# Patient Record
Sex: Female | Born: 1960
Health system: Southern US, Community
[De-identification: ages and names within clinical notes are randomized; demographics above are authoritative.]

## PROBLEM LIST (undated history)

## (undated) DIAGNOSIS — J3501 Chronic tonsillitis: Secondary | ICD-10-CM

## (undated) DIAGNOSIS — B351 Tinea unguium: Secondary | ICD-10-CM

## (undated) DIAGNOSIS — M545 Low back pain, unspecified: Secondary | ICD-10-CM

## (undated) DIAGNOSIS — H60399 Other infective otitis externa, unspecified ear: Secondary | ICD-10-CM

## (undated) DIAGNOSIS — T23272A Burn of second degree of left wrist, initial encounter: Secondary | ICD-10-CM

## (undated) DIAGNOSIS — G950 Syringomyelia and syringobulbia: Secondary | ICD-10-CM

## (undated) DIAGNOSIS — Z9889 Other specified postprocedural states: Secondary | ICD-10-CM

## (undated) DIAGNOSIS — K219 Gastro-esophageal reflux disease without esophagitis: Secondary | ICD-10-CM

## (undated) DIAGNOSIS — N393 Stress incontinence (female) (male): Secondary | ICD-10-CM

## (undated) DIAGNOSIS — L259 Unspecified contact dermatitis, unspecified cause: Secondary | ICD-10-CM

## (undated) DIAGNOSIS — I219 Acute myocardial infarction, unspecified: Secondary | ICD-10-CM

## (undated) DIAGNOSIS — R0789 Other chest pain: Secondary | ICD-10-CM

## (undated) DIAGNOSIS — M1990 Unspecified osteoarthritis, unspecified site: Secondary | ICD-10-CM

## (undated) DIAGNOSIS — R030 Elevated blood-pressure reading, without diagnosis of hypertension: Secondary | ICD-10-CM

## (undated) DIAGNOSIS — M542 Cervicalgia: Secondary | ICD-10-CM

## (undated) DIAGNOSIS — G43919 Migraine, unspecified, intractable, without status migrainosus: Secondary | ICD-10-CM

## (undated) DIAGNOSIS — R51 Headache: Secondary | ICD-10-CM

## (undated) DIAGNOSIS — G43719 Chronic migraine without aura, intractable, without status migrainosus: Secondary | ICD-10-CM

## (undated) DIAGNOSIS — R509 Fever, unspecified: Secondary | ICD-10-CM

## (undated) DIAGNOSIS — R748 Abnormal levels of other serum enzymes: Secondary | ICD-10-CM

## (undated) DIAGNOSIS — R21 Rash and other nonspecific skin eruption: Secondary | ICD-10-CM

## (undated) DIAGNOSIS — R0600 Dyspnea, unspecified: Secondary | ICD-10-CM

## (undated) DIAGNOSIS — F429 Obsessive-compulsive disorder, unspecified: Secondary | ICD-10-CM

## (undated) DIAGNOSIS — N12 Tubulo-interstitial nephritis, not specified as acute or chronic: Secondary | ICD-10-CM

## (undated) DIAGNOSIS — G43909 Migraine, unspecified, not intractable, without status migrainosus: Secondary | ICD-10-CM

## (undated) DIAGNOSIS — Z0000 Encounter for general adult medical examination without abnormal findings: Secondary | ICD-10-CM

## (undated) DIAGNOSIS — M25569 Pain in unspecified knee: Secondary | ICD-10-CM

## (undated) DIAGNOSIS — F439 Reaction to severe stress, unspecified: Secondary | ICD-10-CM

## (undated) DIAGNOSIS — R55 Syncope and collapse: Secondary | ICD-10-CM

## (undated) DIAGNOSIS — I10 Essential (primary) hypertension: Secondary | ICD-10-CM

## (undated) DIAGNOSIS — R945 Abnormal results of liver function studies: Secondary | ICD-10-CM

## (undated) DIAGNOSIS — J45909 Unspecified asthma, uncomplicated: Secondary | ICD-10-CM

## (undated) DIAGNOSIS — R209 Unspecified disturbances of skin sensation: Secondary | ICD-10-CM

## (undated) DIAGNOSIS — T7840XA Allergy, unspecified, initial encounter: Secondary | ICD-10-CM

## (undated) DIAGNOSIS — R110 Nausea: Secondary | ICD-10-CM

## (undated) DIAGNOSIS — K5900 Constipation, unspecified: Secondary | ICD-10-CM

## (undated) DIAGNOSIS — R000 Tachycardia, unspecified: Secondary | ICD-10-CM

## (undated) DIAGNOSIS — R002 Palpitations: Secondary | ICD-10-CM

## (undated) DIAGNOSIS — N390 Urinary tract infection, site not specified: Secondary | ICD-10-CM

## (undated) DIAGNOSIS — R739 Hyperglycemia, unspecified: Secondary | ICD-10-CM

## (undated) DIAGNOSIS — R112 Nausea with vomiting, unspecified: Secondary | ICD-10-CM

## (undated) DIAGNOSIS — J019 Acute sinusitis, unspecified: Secondary | ICD-10-CM

## (undated) DIAGNOSIS — R635 Abnormal weight gain: Secondary | ICD-10-CM

## (undated) DIAGNOSIS — I951 Orthostatic hypotension: Secondary | ICD-10-CM

## (undated) DIAGNOSIS — G930 Cerebral cysts: Secondary | ICD-10-CM

## (undated) DIAGNOSIS — R1012 Left upper quadrant pain: Secondary | ICD-10-CM

## (undated) HISTORY — DX: Low back pain: M54.5

## (undated) HISTORY — DX: Cerebral cysts: G93.0

## (undated) HISTORY — DX: Gastro-esophageal reflux disease without esophagitis: K21.9

## (undated) HISTORY — DX: Essential (primary) hypertension: I10

## (undated) HISTORY — DX: Tachycardia, unspecified: R00.0

## (undated) HISTORY — DX: Allergy, unspecified, initial encounter: T78.40XA

## (undated) HISTORY — DX: Urinary tract infection, site not specified: N39.0

## (undated) HISTORY — DX: Constipation, unspecified: K59.00

## (undated) HISTORY — PX: TUBAL LIGATION: SHX77

## (undated) HISTORY — DX: Obsessive-compulsive disorder, unspecified: F42.9

## (undated) HISTORY — DX: Headache: R51

## (undated) HISTORY — DX: Other specified postprocedural states: Z98.890

## (undated) HISTORY — DX: Abnormal results of liver function studies: R94.5

## (undated) HISTORY — DX: Acute myocardial infarction, unspecified: I21.9

## (undated) HISTORY — DX: Syringomyelia and syringobulbia: G95.0

## (undated) HISTORY — DX: Unspecified asthma, uncomplicated: J45.909

## (undated) HISTORY — DX: Tinea unguium: B35.1

## (undated) HISTORY — DX: Abnormal weight gain: R63.5

## (undated) HISTORY — DX: Pain in unspecified knee: M25.569

## (undated) HISTORY — DX: Stress incontinence (female) (male): N39.3

## (undated) HISTORY — DX: Low back pain, unspecified: M54.50

## (undated) HISTORY — DX: Migraine, unspecified, not intractable, without status migrainosus: G43.909

## (undated) HISTORY — DX: Nausea: R11.0

## (undated) HISTORY — PX: JOINT REPLACEMENT: SHX530

## (undated) HISTORY — DX: Palpitations: R00.2

## (undated) HISTORY — DX: Unspecified contact dermatitis, unspecified cause: L25.9

## (undated) HISTORY — DX: Burn of second degree of left wrist, initial encounter: T23.272A

## (undated) HISTORY — DX: Unspecified disturbances of skin sensation: R20.9

## (undated) HISTORY — DX: Unspecified osteoarthritis, unspecified site: M19.90

## (undated) HISTORY — DX: Tubulo-interstitial nephritis, not specified as acute or chronic: N12

## (undated) HISTORY — DX: Rash and other nonspecific skin eruption: R21

## (undated) HISTORY — DX: Cervicalgia: M54.2

## (undated) HISTORY — PX: BRAIN SURGERY: SHX531

## (undated) HISTORY — DX: Left upper quadrant pain: R10.12

## (undated) HISTORY — DX: Reaction to severe stress, unspecified: F43.9

## (undated) HISTORY — DX: Other chest pain: R07.89

## (undated) HISTORY — DX: Fever, unspecified: R50.9

## (undated) HISTORY — DX: Elevated blood-pressure reading, without diagnosis of hypertension: R03.0

## (undated) HISTORY — DX: Migraine, unspecified, intractable, without status migrainosus: G43.919

## (undated) HISTORY — DX: Encounter for general adult medical examination without abnormal findings: Z00.00

## (undated) HISTORY — DX: Acute sinusitis, unspecified: J01.90

## (undated) HISTORY — DX: Chronic migraine without aura, intractable, without status migrainosus: G43.719

## (undated) HISTORY — DX: Syncope and collapse: R55

## (undated) HISTORY — DX: Chronic tonsillitis: J35.01

## (undated) HISTORY — DX: Dyspnea, unspecified: R06.00

## (undated) HISTORY — DX: Orthostatic hypotension: I95.1

## (undated) HISTORY — PX: ABDOMINAL HYSTERECTOMY: SHX81

## (undated) HISTORY — DX: Nausea with vomiting, unspecified: R11.2

## (undated) HISTORY — DX: Hyperglycemia, unspecified: R73.9

## (undated) HISTORY — DX: Other infective otitis externa, unspecified ear: H60.399

## (undated) HISTORY — DX: Abnormal levels of other serum enzymes: R74.8

---

## 1999-08-23 ENCOUNTER — Other Ambulatory Visit: Admission: RE | Admit: 1999-08-23 | Discharge: 1999-08-23 | Payer: Self-pay | Admitting: Obstetrics & Gynecology

## 2000-08-21 ENCOUNTER — Other Ambulatory Visit: Admission: RE | Admit: 2000-08-21 | Discharge: 2000-08-21 | Payer: Self-pay | Admitting: Obstetrics & Gynecology

## 2001-08-24 ENCOUNTER — Other Ambulatory Visit: Admission: RE | Admit: 2001-08-24 | Discharge: 2001-08-24 | Payer: Self-pay | Admitting: Obstetrics & Gynecology

## 2002-12-15 ENCOUNTER — Other Ambulatory Visit: Admission: RE | Admit: 2002-12-15 | Discharge: 2002-12-15 | Payer: Self-pay | Admitting: Obstetrics & Gynecology

## 2003-06-07 ENCOUNTER — Encounter: Payer: Self-pay | Admitting: Obstetrics & Gynecology

## 2003-06-07 ENCOUNTER — Encounter: Admission: RE | Admit: 2003-06-07 | Discharge: 2003-06-07 | Payer: Self-pay | Admitting: Obstetrics & Gynecology

## 2003-10-28 HISTORY — PX: COLONOSCOPY: SHX5424

## 2003-11-15 ENCOUNTER — Ambulatory Visit (HOSPITAL_COMMUNITY): Admission: RE | Admit: 2003-11-15 | Discharge: 2003-11-15 | Payer: Self-pay | Admitting: Internal Medicine

## 2004-06-20 ENCOUNTER — Encounter: Admission: RE | Admit: 2004-06-20 | Discharge: 2004-06-20 | Payer: Self-pay | Admitting: Obstetrics & Gynecology

## 2004-06-27 ENCOUNTER — Encounter: Admission: RE | Admit: 2004-06-27 | Discharge: 2004-06-27 | Payer: Self-pay | Admitting: Obstetrics & Gynecology

## 2004-10-01 ENCOUNTER — Ambulatory Visit: Payer: Self-pay | Admitting: Internal Medicine

## 2004-10-16 ENCOUNTER — Ambulatory Visit: Payer: Self-pay | Admitting: Internal Medicine

## 2004-10-27 HISTORY — PX: OTHER SURGICAL HISTORY: SHX169

## 2004-11-12 ENCOUNTER — Ambulatory Visit: Payer: Self-pay | Admitting: Internal Medicine

## 2005-02-04 ENCOUNTER — Ambulatory Visit: Payer: Self-pay | Admitting: Internal Medicine

## 2005-03-07 ENCOUNTER — Ambulatory Visit: Payer: Self-pay | Admitting: Internal Medicine

## 2005-05-22 ENCOUNTER — Ambulatory Visit: Payer: Self-pay | Admitting: Internal Medicine

## 2005-09-24 ENCOUNTER — Ambulatory Visit: Payer: Self-pay | Admitting: Internal Medicine

## 2005-10-29 ENCOUNTER — Ambulatory Visit: Payer: Self-pay | Admitting: Internal Medicine

## 2005-11-18 ENCOUNTER — Encounter: Admission: RE | Admit: 2005-11-18 | Discharge: 2005-11-18 | Payer: Self-pay | Admitting: Obstetrics & Gynecology

## 2005-11-19 ENCOUNTER — Ambulatory Visit: Payer: Self-pay | Admitting: Internal Medicine

## 2006-01-14 ENCOUNTER — Ambulatory Visit: Payer: Self-pay | Admitting: Internal Medicine

## 2006-02-10 ENCOUNTER — Ambulatory Visit: Payer: Self-pay | Admitting: Internal Medicine

## 2006-03-24 ENCOUNTER — Ambulatory Visit: Payer: Self-pay | Admitting: Internal Medicine

## 2006-06-02 ENCOUNTER — Ambulatory Visit: Payer: Self-pay | Admitting: Internal Medicine

## 2006-07-23 ENCOUNTER — Ambulatory Visit: Payer: Self-pay | Admitting: Internal Medicine

## 2006-07-31 ENCOUNTER — Emergency Department (HOSPITAL_COMMUNITY): Admission: EM | Admit: 2006-07-31 | Discharge: 2006-07-31 | Payer: Self-pay | Admitting: Emergency Medicine

## 2006-08-19 ENCOUNTER — Emergency Department (HOSPITAL_COMMUNITY): Admission: EM | Admit: 2006-08-19 | Discharge: 2006-08-19 | Payer: Self-pay | Admitting: Emergency Medicine

## 2006-09-22 ENCOUNTER — Ambulatory Visit: Payer: Self-pay | Admitting: Internal Medicine

## 2006-10-27 DIAGNOSIS — N12 Tubulo-interstitial nephritis, not specified as acute or chronic: Secondary | ICD-10-CM

## 2006-10-27 HISTORY — DX: Tubulo-interstitial nephritis, not specified as acute or chronic: N12

## 2006-11-17 ENCOUNTER — Ambulatory Visit: Payer: Self-pay | Admitting: Internal Medicine

## 2006-11-24 ENCOUNTER — Encounter: Admission: RE | Admit: 2006-11-24 | Discharge: 2006-11-24 | Payer: Self-pay | Admitting: Obstetrics & Gynecology

## 2007-02-16 ENCOUNTER — Ambulatory Visit: Payer: Self-pay | Admitting: Internal Medicine

## 2007-05-19 ENCOUNTER — Ambulatory Visit: Payer: Self-pay | Admitting: Internal Medicine

## 2007-05-19 LAB — CONVERTED CEMR LAB
ALT: 50 units/L — ABNORMAL HIGH (ref 0–35)
AST: 40 units/L — ABNORMAL HIGH (ref 0–37)
Albumin: 3.9 g/dL (ref 3.5–5.2)
Basophils Absolute: 0.1 10*3/uL (ref 0.0–0.1)
Calcium: 9.3 mg/dL (ref 8.4–10.5)
Chloride: 106 meq/L (ref 96–112)
Creatinine, Ser: 0.7 mg/dL (ref 0.4–1.2)
Eosinophils Absolute: 0.1 10*3/uL (ref 0.0–0.6)
Eosinophils Relative: 1.7 % (ref 0.0–5.0)
GFR calc non Af Amer: 95 mL/min
HCT: 34.6 % — ABNORMAL LOW (ref 36.0–46.0)
MCHC: 35.5 g/dL (ref 30.0–36.0)
MCV: 93.5 fL (ref 78.0–100.0)
Neutrophils Relative %: 59.7 % (ref 43.0–77.0)
Platelets: 214 10*3/uL (ref 150–400)
RBC: 3.7 M/uL — ABNORMAL LOW (ref 3.87–5.11)
RDW: 12.5 % (ref 11.5–14.6)
Sodium: 141 meq/L (ref 135–145)
Total Bilirubin: 1 mg/dL (ref 0.3–1.2)
WBC: 5.8 10*3/uL (ref 4.5–10.5)

## 2007-08-18 ENCOUNTER — Telehealth: Payer: Self-pay | Admitting: Internal Medicine

## 2007-08-20 ENCOUNTER — Encounter: Payer: Self-pay | Admitting: Internal Medicine

## 2007-08-20 DIAGNOSIS — G950 Syringomyelia and syringobulbia: Secondary | ICD-10-CM | POA: Insufficient documentation

## 2007-08-20 HISTORY — DX: Syringomyelia and syringobulbia: G95.0

## 2007-09-17 ENCOUNTER — Telehealth: Payer: Self-pay | Admitting: Internal Medicine

## 2007-09-29 ENCOUNTER — Ambulatory Visit: Payer: Self-pay | Admitting: Internal Medicine

## 2007-09-29 DIAGNOSIS — K219 Gastro-esophageal reflux disease without esophagitis: Secondary | ICD-10-CM

## 2007-09-29 DIAGNOSIS — R509 Fever, unspecified: Secondary | ICD-10-CM

## 2007-09-29 DIAGNOSIS — M545 Low back pain, unspecified: Secondary | ICD-10-CM

## 2007-09-29 HISTORY — DX: Fever, unspecified: R50.9

## 2007-09-29 HISTORY — DX: Gastro-esophageal reflux disease without esophagitis: K21.9

## 2007-09-29 HISTORY — DX: Low back pain, unspecified: M54.50

## 2007-09-29 LAB — CONVERTED CEMR LAB
Basophils Absolute: 0 10*3/uL (ref 0.0–0.1)
Bilirubin Urine: NEGATIVE
Chloride: 98 meq/L (ref 96–112)
Creatinine, Ser: 0.8 mg/dL (ref 0.4–1.2)
Eosinophils Absolute: 0 10*3/uL (ref 0.0–0.6)
GFR calc non Af Amer: 82 mL/min
Glucose, Bld: 124 mg/dL — ABNORMAL HIGH (ref 70–99)
HCT: 38.1 % (ref 36.0–46.0)
Hemoglobin: 12.9 g/dL (ref 12.0–15.0)
Lymphocytes Relative: 11.8 % — ABNORMAL LOW (ref 12.0–46.0)
MCHC: 33.9 g/dL (ref 30.0–36.0)
MCV: 93.1 fL (ref 78.0–100.0)
Monocytes Absolute: 0.5 10*3/uL (ref 0.2–0.7)
Monocytes Relative: 4.8 % (ref 3.0–11.0)
Neutro Abs: 8.1 10*3/uL — ABNORMAL HIGH (ref 1.4–7.7)
Neutrophils Relative %: 82.9 % — ABNORMAL HIGH (ref 43.0–77.0)
Nitrite: POSITIVE — AB
Potassium: 3.8 meq/L (ref 3.5–5.1)
Sodium: 136 meq/L (ref 135–145)
Total Protein, Urine: NEGATIVE mg/dL
Urine Glucose: NEGATIVE mg/dL
Urobilinogen, UA: 0.2 (ref 0.0–1.0)
pH: 6 (ref 5.0–8.0)

## 2007-09-30 ENCOUNTER — Telehealth: Payer: Self-pay | Admitting: Internal Medicine

## 2007-10-28 DIAGNOSIS — F439 Reaction to severe stress, unspecified: Secondary | ICD-10-CM

## 2007-10-28 HISTORY — DX: Reaction to severe stress, unspecified: F43.9

## 2007-11-17 ENCOUNTER — Encounter: Payer: Self-pay | Admitting: Internal Medicine

## 2007-12-03 ENCOUNTER — Encounter: Payer: Self-pay | Admitting: Internal Medicine

## 2007-12-20 ENCOUNTER — Ambulatory Visit: Payer: Self-pay | Admitting: Internal Medicine

## 2007-12-20 DIAGNOSIS — R51 Headache: Secondary | ICD-10-CM

## 2007-12-20 DIAGNOSIS — R519 Headache, unspecified: Secondary | ICD-10-CM

## 2007-12-20 HISTORY — DX: Headache, unspecified: R51.9

## 2007-12-22 LAB — CONVERTED CEMR LAB
Bilirubin Urine: NEGATIVE
Hemoglobin, Urine: NEGATIVE

## 2007-12-28 ENCOUNTER — Telehealth: Payer: Self-pay | Admitting: Internal Medicine

## 2007-12-30 ENCOUNTER — Telehealth: Payer: Self-pay | Admitting: Internal Medicine

## 2008-01-20 ENCOUNTER — Encounter: Admission: RE | Admit: 2008-01-20 | Discharge: 2008-01-20 | Payer: Self-pay | Admitting: Obstetrics & Gynecology

## 2008-01-21 ENCOUNTER — Encounter: Payer: Self-pay | Admitting: Internal Medicine

## 2008-01-24 ENCOUNTER — Telehealth: Payer: Self-pay | Admitting: Internal Medicine

## 2008-02-07 ENCOUNTER — Telehealth: Payer: Self-pay | Admitting: Internal Medicine

## 2008-02-22 ENCOUNTER — Telehealth: Payer: Self-pay | Admitting: Internal Medicine

## 2008-03-08 ENCOUNTER — Telehealth: Payer: Self-pay | Admitting: Internal Medicine

## 2008-03-23 ENCOUNTER — Ambulatory Visit: Payer: Self-pay | Admitting: Internal Medicine

## 2008-03-23 DIAGNOSIS — N390 Urinary tract infection, site not specified: Secondary | ICD-10-CM

## 2008-03-23 HISTORY — DX: Urinary tract infection, site not specified: N39.0

## 2008-03-27 ENCOUNTER — Telehealth: Payer: Self-pay | Admitting: Internal Medicine

## 2008-04-11 ENCOUNTER — Ambulatory Visit: Payer: Self-pay | Admitting: Internal Medicine

## 2008-04-11 LAB — CONVERTED CEMR LAB
BUN: 13 mg/dL (ref 6–23)
CO2: 27 meq/L (ref 19–32)
Chloride: 108 meq/L (ref 96–112)
Creatinine, Ser: 0.9 mg/dL (ref 0.4–1.2)
Glucose, Bld: 71 mg/dL (ref 70–99)

## 2008-04-18 ENCOUNTER — Encounter: Payer: Self-pay | Admitting: Internal Medicine

## 2008-04-20 ENCOUNTER — Encounter: Payer: Self-pay | Admitting: Internal Medicine

## 2008-05-02 ENCOUNTER — Encounter: Payer: Self-pay | Admitting: Internal Medicine

## 2008-05-10 ENCOUNTER — Ambulatory Visit: Payer: Self-pay | Admitting: Internal Medicine

## 2008-05-10 DIAGNOSIS — H60399 Other infective otitis externa, unspecified ear: Secondary | ICD-10-CM | POA: Insufficient documentation

## 2008-05-10 HISTORY — DX: Other infective otitis externa, unspecified ear: H60.399

## 2008-07-13 ENCOUNTER — Ambulatory Visit: Payer: Self-pay | Admitting: Internal Medicine

## 2008-07-13 DIAGNOSIS — R209 Unspecified disturbances of skin sensation: Secondary | ICD-10-CM | POA: Insufficient documentation

## 2008-07-13 HISTORY — DX: Unspecified disturbances of skin sensation: R20.9

## 2008-07-25 ENCOUNTER — Telehealth: Payer: Self-pay | Admitting: Internal Medicine

## 2008-09-07 ENCOUNTER — Encounter
Admission: RE | Admit: 2008-09-07 | Discharge: 2008-12-06 | Payer: Self-pay | Admitting: Physical Medicine and Rehabilitation

## 2008-09-08 ENCOUNTER — Ambulatory Visit: Payer: Self-pay | Admitting: Physical Medicine and Rehabilitation

## 2008-09-12 ENCOUNTER — Ambulatory Visit: Payer: Self-pay | Admitting: Internal Medicine

## 2008-09-25 ENCOUNTER — Telehealth: Payer: Self-pay | Admitting: Internal Medicine

## 2008-09-27 ENCOUNTER — Encounter: Payer: Self-pay | Admitting: Internal Medicine

## 2008-10-06 ENCOUNTER — Ambulatory Visit: Payer: Self-pay | Admitting: Physical Medicine and Rehabilitation

## 2008-11-17 ENCOUNTER — Ambulatory Visit: Payer: Self-pay | Admitting: Physical Medicine and Rehabilitation

## 2008-12-05 ENCOUNTER — Ambulatory Visit: Payer: Self-pay | Admitting: Internal Medicine

## 2008-12-05 DIAGNOSIS — J019 Acute sinusitis, unspecified: Secondary | ICD-10-CM | POA: Insufficient documentation

## 2008-12-05 HISTORY — DX: Acute sinusitis, unspecified: J01.90

## 2009-01-03 ENCOUNTER — Telehealth: Payer: Self-pay | Admitting: Internal Medicine

## 2009-02-06 ENCOUNTER — Ambulatory Visit: Payer: Self-pay | Admitting: Internal Medicine

## 2009-02-06 DIAGNOSIS — N393 Stress incontinence (female) (male): Secondary | ICD-10-CM

## 2009-02-06 HISTORY — DX: Stress incontinence (female) (male): N39.3

## 2009-02-07 ENCOUNTER — Encounter: Payer: Self-pay | Admitting: Internal Medicine

## 2009-02-08 LAB — CONVERTED CEMR LAB
Bilirubin Urine: NEGATIVE
Hemoglobin, Urine: NEGATIVE
Ketones, ur: NEGATIVE mg/dL
Leukocytes, UA: NEGATIVE
Nitrite: NEGATIVE
Specific Gravity, Urine: <=1.005 (ref 1.000–1.030)
Total Protein, Urine: NEGATIVE mg/dL
Urine Glucose: NEGATIVE mg/dL
Urobilinogen, UA: 0.2 (ref 0.0–1.0)
pH: 6 (ref 5.0–8.0)

## 2009-03-02 ENCOUNTER — Telehealth: Payer: Self-pay | Admitting: Internal Medicine

## 2009-03-16 ENCOUNTER — Ambulatory Visit: Payer: Self-pay | Admitting: Internal Medicine

## 2009-03-19 LAB — CONVERTED CEMR LAB
Ketones, ur: NEGATIVE mg/dL
Specific Gravity, Urine: 1.01 (ref 1.000–1.030)
Urine Glucose: NEGATIVE mg/dL
pH: 6.5 (ref 5.0–8.0)

## 2009-03-20 ENCOUNTER — Telehealth: Payer: Self-pay | Admitting: Internal Medicine

## 2009-03-30 ENCOUNTER — Ambulatory Visit: Payer: Self-pay | Admitting: Internal Medicine

## 2009-05-02 ENCOUNTER — Telehealth: Payer: Self-pay | Admitting: Internal Medicine

## 2009-05-18 ENCOUNTER — Ambulatory Visit: Payer: Self-pay | Admitting: Internal Medicine

## 2009-05-18 ENCOUNTER — Encounter: Admission: RE | Admit: 2009-05-18 | Discharge: 2009-05-18 | Payer: Self-pay | Admitting: Obstetrics & Gynecology

## 2009-05-29 ENCOUNTER — Telehealth: Payer: Self-pay | Admitting: Internal Medicine

## 2009-06-01 ENCOUNTER — Telehealth: Payer: Self-pay | Admitting: Internal Medicine

## 2009-06-14 ENCOUNTER — Ambulatory Visit: Payer: Self-pay | Admitting: Internal Medicine

## 2009-06-14 DIAGNOSIS — M542 Cervicalgia: Secondary | ICD-10-CM | POA: Insufficient documentation

## 2009-06-14 DIAGNOSIS — G43919 Migraine, unspecified, intractable, without status migrainosus: Secondary | ICD-10-CM

## 2009-06-14 DIAGNOSIS — G43909 Migraine, unspecified, not intractable, without status migrainosus: Secondary | ICD-10-CM | POA: Insufficient documentation

## 2009-06-14 DIAGNOSIS — R110 Nausea: Secondary | ICD-10-CM

## 2009-06-14 HISTORY — DX: Migraine, unspecified, intractable, without status migrainosus: G43.919

## 2009-06-14 HISTORY — DX: Nausea: R11.0

## 2009-06-14 HISTORY — DX: Cervicalgia: M54.2

## 2009-07-10 ENCOUNTER — Telehealth: Payer: Self-pay | Admitting: Internal Medicine

## 2009-07-27 ENCOUNTER — Ambulatory Visit: Payer: Self-pay | Admitting: Internal Medicine

## 2009-07-27 ENCOUNTER — Encounter: Payer: Self-pay | Admitting: Internal Medicine

## 2009-07-30 LAB — CONVERTED CEMR LAB
Alkaline Phosphatase: 204 units/L — ABNORMAL HIGH (ref 39–117)
Basophils Relative: 0.8 % (ref 0.0–3.0)
Bilirubin, Direct: 0.1 mg/dL (ref 0.0–0.3)
CO2: 32 meq/L (ref 19–32)
Calcium: 10 mg/dL (ref 8.4–10.5)
Eosinophils Absolute: 0.1 10*3/uL (ref 0.0–0.7)
GFR calc non Af Amer: 70.61 mL/min (ref >60)
HCT: 39.7 % (ref 36.0–46.0)
Hemoglobin: 13.6 g/dL (ref 12.0–15.0)
Lymphs Abs: 1.4 10*3/uL (ref 0.7–4.0)
MCHC: 34.2 g/dL (ref 30.0–36.0)
MCV: 93.6 fL (ref 78.0–100.0)
Monocytes Absolute: 0.1 10*3/uL (ref 0.1–1.0)
Neutro Abs: 2.8 10*3/uL (ref 1.4–7.7)
RBC: 4.24 M/uL (ref 3.87–5.11)
Sodium: 143 meq/L (ref 135–145)
Total Bilirubin: 0.7 mg/dL (ref 0.3–1.2)
Total Protein: 7.2 g/dL (ref 6.0–8.3)

## 2009-08-01 ENCOUNTER — Telehealth: Payer: Self-pay | Admitting: Internal Medicine

## 2009-08-03 ENCOUNTER — Telehealth: Payer: Self-pay | Admitting: Internal Medicine

## 2009-08-03 ENCOUNTER — Encounter: Admission: RE | Admit: 2009-08-03 | Discharge: 2009-08-03 | Payer: Self-pay | Admitting: Internal Medicine

## 2009-08-27 ENCOUNTER — Encounter: Payer: Self-pay | Admitting: Internal Medicine

## 2009-09-03 ENCOUNTER — Telehealth: Payer: Self-pay | Admitting: Internal Medicine

## 2009-09-17 ENCOUNTER — Telehealth: Payer: Self-pay | Admitting: Internal Medicine

## 2009-09-28 ENCOUNTER — Ambulatory Visit: Payer: Self-pay | Admitting: Internal Medicine

## 2009-09-28 DIAGNOSIS — R945 Abnormal results of liver function studies: Secondary | ICD-10-CM

## 2009-09-28 DIAGNOSIS — R635 Abnormal weight gain: Secondary | ICD-10-CM | POA: Insufficient documentation

## 2009-09-28 HISTORY — DX: Abnormal results of liver function studies: R94.5

## 2009-10-03 LAB — CONVERTED CEMR LAB
ALT: 43 units/L — ABNORMAL HIGH (ref 0–35)
AST: 36 units/L (ref 0–37)
Albumin: 3.9 g/dL (ref 3.5–5.2)
Bilirubin Urine: NEGATIVE
Hemoglobin, Urine: NEGATIVE
Ketones, ur: NEGATIVE mg/dL
Leukocytes, UA: NEGATIVE
Nitrite: NEGATIVE

## 2009-12-07 ENCOUNTER — Ambulatory Visit: Payer: Self-pay | Admitting: Internal Medicine

## 2010-01-07 ENCOUNTER — Telehealth: Payer: Self-pay | Admitting: Internal Medicine

## 2010-02-13 ENCOUNTER — Telehealth: Payer: Self-pay | Admitting: Internal Medicine

## 2010-03-06 ENCOUNTER — Ambulatory Visit: Payer: Self-pay | Admitting: Internal Medicine

## 2010-03-06 LAB — CONVERTED CEMR LAB
ALT: 32 units/L (ref 0–35)
Albumin: 4.2 g/dL (ref 3.5–5.2)
BUN: 13 mg/dL (ref 6–23)
Basophils Absolute: 0 10*3/uL (ref 0.0–0.1)
Basophils Relative: 0.7 % (ref 0.0–3.0)
Chloride: 107 meq/L (ref 96–112)
Cholesterol: 201 mg/dL — ABNORMAL HIGH (ref 0–200)
Creatinine, Ser: 0.8 mg/dL (ref 0.4–1.2)
Eosinophils Absolute: 0.1 10*3/uL (ref 0.0–0.7)
GFR calc non Af Amer: 85.62 mL/min (ref >60)
Glucose, Bld: 91 mg/dL (ref 70–99)
Hemoglobin: 12.8 g/dL (ref 12.0–15.0)
Lymphocytes Relative: 38.7 % (ref 12.0–46.0)
Lymphs Abs: 1.8 10*3/uL (ref 0.7–4.0)
MCHC: 34.5 g/dL (ref 30.0–36.0)
MCV: 90.9 fL (ref 78.0–100.0)
Monocytes Absolute: 0.2 10*3/uL (ref 0.1–1.0)
Neutro Abs: 2.5 10*3/uL (ref 1.4–7.7)
RBC: 4.08 M/uL (ref 3.87–5.11)
RDW: 13.4 % (ref 11.5–14.6)
Total Bilirubin: 0.8 mg/dL (ref 0.3–1.2)
Total CHOL/HDL Ratio: 4
Triglycerides: 129 mg/dL (ref 0.0–149.0)

## 2010-03-07 ENCOUNTER — Ambulatory Visit: Payer: Self-pay | Admitting: Internal Medicine

## 2010-03-15 ENCOUNTER — Telehealth: Payer: Self-pay | Admitting: Internal Medicine

## 2010-05-20 ENCOUNTER — Telehealth: Payer: Self-pay | Admitting: Internal Medicine

## 2010-05-23 ENCOUNTER — Ambulatory Visit: Payer: Self-pay | Admitting: Internal Medicine

## 2010-05-23 DIAGNOSIS — F429 Obsessive-compulsive disorder, unspecified: Secondary | ICD-10-CM

## 2010-05-23 DIAGNOSIS — R1012 Left upper quadrant pain: Secondary | ICD-10-CM

## 2010-05-23 HISTORY — DX: Left upper quadrant pain: R10.12

## 2010-05-23 HISTORY — DX: Obsessive-compulsive disorder, unspecified: F42.9

## 2010-08-23 ENCOUNTER — Ambulatory Visit: Payer: Self-pay | Admitting: Internal Medicine

## 2010-09-09 ENCOUNTER — Telehealth: Payer: Self-pay | Admitting: Internal Medicine

## 2010-09-23 ENCOUNTER — Ambulatory Visit: Payer: Self-pay | Admitting: Internal Medicine

## 2010-11-17 ENCOUNTER — Encounter: Payer: Self-pay | Admitting: Obstetrics & Gynecology

## 2010-11-22 ENCOUNTER — Ambulatory Visit
Admission: RE | Admit: 2010-11-22 | Discharge: 2010-11-22 | Payer: Self-pay | Source: Home / Self Care | Attending: Internal Medicine | Admitting: Internal Medicine

## 2010-11-28 NOTE — Progress Notes (Signed)
Summary: Rx request   Phone Note Call from Patient   Caller: Patient Summary of Call: pt left vm stating she has a sore throat and head congestion. She is requesting Zpak be sent to [facility]. Please advise Initial call taken by: Lanier Prude, [facility]),  September 09, 2010 5:07 PM  Follow-up for Phone Call        ok Follow-up by: Tresa Garter MD,  September 09, 2010 9:20 PM  Additional Follow-up for Phone Call Additional follow up Details #1::        Rx sent in//lmovm to notify pt.Alvy Beal Archie CMA  September 10, 2010 8:13 AM     New/Updated Medications: ZITHROMAX Z-PAK 250 MG TABS (AZITHROMYCIN) as dirrected Prescriptions: ZITHROMAX Z-PAK 250 MG TABS (AZITHROMYCIN) as dirrected  #1 x 0   Entered by:   Rock Nephew CMA   Authorized by:   Tresa Garter MD   Signed by:   Rock Nephew CMA on 09/10/2010   Method used:   Electronically to        [facility]* (retail)       [address]       [place], [place]  [id number]       Ph: [phone]       Fax: [phone]   RxID:   [id number]

## 2010-11-28 NOTE — Assessment & Plan Note (Signed)
Summary: 3 mo rov /nws  #   Vital Signs:  Patient profile:   50 year old female Height:      65 inches Weight:      165 pounds BMI:     27.56 Temp:     97.6 degrees F oral Pulse rate:   84 / minute Pulse rhythm:   regular Resp:     16 per minute BP sitting:   130 / 90  (left arm) Cuff size:   regular  Vitals Entered By: Lanier Prude, CMA(AAMA) (August 23, 2010 8:22 AM) CC: 3 mo f/u Comments pt is not taking Fluvoxamine.  She states she needs new rx for maxalt.  She has been taking 2 once daily    CC:  3 mo f/u.  History of Present Illness: The patient presents for a follow up of back pain, anxiety, OCD  and headaches.  OCD is better. She had a few more HAs  lately  Current Medications (verified): 1)  Oxycodone Hcl 15 Mg  Tabs (Oxycodone Hcl) .... 1/2 or 1 By Mouth Qid As Needed Pain Please,  Fill On or After  07/22/10          . Ok To Fill 1-2 Day Earlier When The Month Has 31 Days in It. 2)  Ibuprofen 600 Mg  Tabs (Ibuprofen) .... Take 1 Tablet By Mouth Three Times A Day As Needed Pain 3)  Protonix 40 Mg  Tbec (Pantoprazole Sodium) .Marland Kitchen.. 1 Once Daily 4)  Vitamin D3 1000 Unit  Tabs (Cholecalciferol) .Marland Kitchen.. 1 By Mouth Daily 5)  Topicort 0.25 % Crea (Desoximetasone) .... Use Two Times A Day Prn 6)  Sumatriptan Succinate 6 Mg/0.66ml Kit (Sumatriptan Succinate) .Marland Kitchen.. 1 Once Daily As Dirr Prn 7)  Promethazine Hcl 25 Mg  Tabs (Promethazine Hcl) .Marland Kitchen.. 1-2 By Mouth Qid Prn 8)  Maxalt-Mlt 10 Mg Tbdp (Rizatriptan Benzoate) .Marland Kitchen.. 1 By Mouth Once Daily Prn 9)  Zolpidem Tartrate 10 Mg Tabs (Zolpidem Tartrate) .... 1/2-1 Tab At Bedtime As Needed Insomnia 10)  Fluvoxamine Maleate 50 Mg Tabs (Fluvoxamine Maleate) .Marland Kitchen.. 1 By Mouth Bid  Allergies (verified): 1)  ! Codeine Sulfate (Codeine Sulfate) 2)  ! Vicodin (Hydrocodone-Acetaminophen) 3)  ! Ultram (Tramadol Hcl) 4)  ! Lyrica (Pregabalin) 5)  ! Duragesic-25 (Fentanyl) 6)  Ceftin 7)  Macrobid  Past History:  Past Medical History: Last  updated: 09/12/2008 Syringomyelia GERD Low back pain Dr Barbee [place] Subtle R weakness Migraines Pyelonephritis 2008 UTIs Stress 2009  Social History: Last updated: 09/12/2008 Married Never Smoked Alcohol use-no 2 children, one GS owner - small business  Review of Systems  The patient denies weight gain, chest pain, and abdominal pain.    Physical Exam  General:  Looks well, NAD Nose:  External nasal examination shows no deformity or inflammation. Nasal mucosa are pink and moist without lesions or exudates. Mouth:  Oral mucosa and oropharynx without lesions or exudates.  Teeth in good repair. Neck:  supple and no masses.   Lungs:  Normal respiratory effort, chest expands symmetrically. Lungs are clear to auscultation, no crackles or wheezes. Heart:  RRR Abdomen:  S/NT Msk:   Lt side of body hurts Extremities:  No clubbing, cyanosis, edema, or deformity noted with normal full range of motion of all joints.   Neurologic:  No cranial nerve deficits noted. Station and gait are normal. Plantar reflexes are down-going bilaterally. DTRs are symmetrical throughout. Sensory, motor and coordinative functions appear intact. Skin:  Intact without suspicious lesions or rashes  Psych:  Oriented X3 and normally interactive.     Impression & Recommendations:  Problem # 1:  OBSESSIVE-COMPULSIVE DISORDER (ICD-300.3) Assessment Improved On the regimen of medicine(s) reflected in the chart    Problem # 2:  HEADACHE (ICD-784.0) Assessment: Deteriorated  Her updated medication list for this problem includes:    Oxycodone Hcl 15 Mg Tabs (Oxycodone hcl) .Marland Kitchen... 1/2 or 1 by mouth qid as needed pain please,  fill on or after  10/21/10          . ok to fill 1-2 day earlier when the month has 31 days in it.    Ibuprofen 600 Mg Tabs (Ibuprofen) .Marland Kitchen... Take 1 tablet by mouth three times a day as needed pain    Sumatriptan Succinate 6 Mg/0.69ml Kit (Sumatriptan succinate) .Marland Kitchen... 1 once daily as dirr prn     Maxalt-mlt 10 Mg Tbdp (Rizatriptan benzoate) .Marland Kitchen... 1 by mouth once daily prn  Problem # 3:  MIGRAINE UNSP W/INTRACTBL W/O STATUS MIGRAINOSUS (ICD-346.91) Assessment: Comment Only  Her updated medication list for this problem includes:    Oxycodone Hcl 15 Mg Tabs (Oxycodone hcl) .Marland Kitchen... 1/2 or 1 by mouth qid as needed pain please,  fill on or after  10/21/10          . ok to fill 1-2 day earlier when the month has 31 days in it.    Ibuprofen 600 Mg Tabs (Ibuprofen) .Marland Kitchen... Take 1 tablet by mouth three times a day as needed pain    Sumatriptan Succinate 6 Mg/0.19ml Kit (Sumatriptan succinate) .Marland Kitchen... 1 once daily as dirr prn    Maxalt-mlt 10 Mg Tbdp (Rizatriptan benzoate) .Marland Kitchen... 1 by mouth once daily prn  Problem # 4:  SYRINGOMYELIA (ICD-336.0) Assessment: Unchanged On the regimen of medicine(s) reflected in the chart  (Oxycod)  Problem # 5:  BACK PAIN (ICD-724.5) Assessment: Unchanged  Her updated medication list for this problem includes:    Oxycodone Hcl 15 Mg Tabs (Oxycodone hcl) .Marland Kitchen... 1/2 or 1 by mouth qid as needed pain please,  fill on or after  10/21/10          . ok to fill 1-2 day earlier when the month has 31 days in it.    Ibuprofen 600 Mg Tabs (Ibuprofen) .Marland Kitchen... Take 1 tablet by mouth three times a day as needed pain  Complete Medication List: 1)  Oxycodone Hcl 15 Mg Tabs (Oxycodone hcl) .... 1/2 or 1 by mouth qid as needed pain please,  fill on or after  10/21/10          . ok to fill 1-2 day earlier when the month has 31 days in it. 2)  Ibuprofen 600 Mg Tabs (Ibuprofen) .... Take 1 tablet by mouth three times a day as needed pain 3)  Protonix 40 Mg Tbec (Pantoprazole sodium) .Marland Kitchen.. 1 once daily 4)  Vitamin D3 1000 Unit Tabs (Cholecalciferol) .Marland Kitchen.. 1 by mouth daily 5)  Topicort 0.25 % Crea (Desoximetasone) .... Use two times a day prn 6)  Sumatriptan Succinate 6 Mg/0.79ml Kit (Sumatriptan succinate) .Marland Kitchen.. 1 once daily as dirr prn 7)  Promethazine Hcl 25 Mg Tabs (Promethazine hcl) .Marland Kitchen..  1-2 by mouth qid prn 8)  Maxalt-mlt 10 Mg Tbdp (Rizatriptan benzoate) .Marland Kitchen.. 1 by mouth once daily prn 9)  Zolpidem Tartrate 10 Mg Tabs (Zolpidem tartrate) .... 1/2-1 tab at bedtime as needed insomnia 10)  Fluvoxamine Maleate 50 Mg Tabs (Fluvoxamine maleate) .Marland Kitchen.. 1 by mouth bid  Patient Instructions: 1)  Please  schedule a follow-up appointment in 3 months.  Contraindications/Deferment of Procedures/Staging:    Test/Procedure: FLU VAX    Reason for deferment: patient declined  Prescriptions: PROMETHAZINE HCL 25 MG  TABS (PROMETHAZINE HCL) 1-2 by mouth qid prn  #60 x 3   Entered and Authorized by:   Tresa Garter MD   Signed by:   Tresa Garter MD on 08/23/2010   Method used:   Electronically to        [facility] [phone]* (retail)       [address]       [place], [place]  [address]       Ph: [phone]       Fax: [phone]   RxID:   [id number] OXYCODONE HCL 15 MG  TABS (OXYCODONE HCL) 1/2 or 1 by mouth qid as needed pain Please,  fill on or after  10/21/10          . OK to fill 1-2 day earlier when the month has 31 days in it.  #120 x 0   Entered and Authorized by:   Tresa Garter MD   Signed by:   Tresa Garter MD on 08/23/2010   Method used:   Print then Give to Patient   RxID:   [id number] OXYCODONE HCL 15 MG  TABS (OXYCODONE HCL) 1/2 or 1 by mouth qid as needed pain Please,  fill on or after  09/21/10          . OK to fill 1-2 day earlier when the month has 31 days in it.  #120 x 0   Entered and Authorized by:   Tresa Garter MD   Signed by:   Tresa Garter MD on 08/23/2010   Method used:   Print then Give to Patient   RxID:   [id number] OXYCODONE HCL 15 MG  TABS (OXYCODONE HCL) 1/2 or 1 by mouth qid as needed pain Please,  fill on or after  08/21/10          . OK to fill 1-2 day earlier when the month has 31 days in it.  #120 x 0   Entered and Authorized by:   Tresa Garter MD    Signed by:   Tresa Garter MD on 08/23/2010   Method used:   Print then Give to Patient   RxID:   [id number] OXYCODONE HCL 15 MG  TABS (OXYCODONE HCL) 1/2 or 1 by mouth qid as needed pain Please,  fill on or after  07/22/10          . OK to fill 1-2 day earlier when the month has 31 days in it.  #120 x 0   Entered and Authorized by:   Tresa Garter MD   Signed by:   Tresa Garter MD on 08/23/2010   Method used:   Print then Give to Patient   RxID:   [id number] ZOLPIDEM TARTRATE 10 MG TABS (ZOLPIDEM TARTRATE) 1/2-1 tab at bedtime as needed insomnia  #30 x 6   Entered and Authorized by:   Tresa Garter MD   Signed by:   Tresa Garter MD on 08/23/2010   Method used:   Print then Give to Patient   RxID:   [id number]    Orders Added: 1)  Est. Patient Level IV [61607]

## 2010-11-28 NOTE — Assessment & Plan Note (Signed)
Summary: CONGESTION/ SORE THROAT/NWS   Vital Signs:  Patient profile:   50 year old female Height:      65 inches Weight:      157 pounds BMI:     26.22 Temp:     98.0 degrees F oral Pulse rate:   84 / minute Pulse rhythm:   regular Resp:     16 per minute BP sitting:   128 / 84  (left arm) Cuff size:   regular  Vitals Entered By: Lanier Prude, CMA(AAMA) (September 23, 2010 1:52 PM) CC: ear pressure,congestion, sneezing, runny nose X 6 days Is Patient Diabetic? No   CC:  ear pressure, congestion, sneezing, and runny nose X 6 days.  History of Present Illness: The patient presents with complaints of sore throat, fever,  sinus congestion and drainge of several days duration. Not better with OTC meds.  Can't sleep due to cough. Muscle aches are present.  The mucus is colored.   Current Medications (verified): 1)  Oxycodone Hcl 15 Mg  Tabs (Oxycodone Hcl) .... 1/2 or 1 By Mouth Qid As Needed Pain Please,  Fill On or After  10/21/10          . Ok To Fill 1-2 Day Earlier When The Month Has 31 Days in It. 2)  Ibuprofen 600 Mg  Tabs (Ibuprofen) .... Take 1 Tablet By Mouth Three Times A Day As Needed Pain 3)  Protonix 40 Mg  Tbec (Pantoprazole Sodium) .Marland Kitchen.. 1 Once Daily 4)  Vitamin D3 1000 Unit  Tabs (Cholecalciferol) .Marland Kitchen.. 1 By Mouth Daily 5)  Topicort 0.25 % Crea (Desoximetasone) .... Use Two Times A Day Prn 6)  Sumatriptan Succinate 6 Mg/0.28ml Kit (Sumatriptan Succinate) .Marland Kitchen.. 1 Once Daily As Dirr Prn 7)  Promethazine Hcl 25 Mg  Tabs (Promethazine Hcl) .Marland Kitchen.. 1-2 By Mouth Qid Prn 8)  Maxalt-Mlt 10 Mg Tbdp (Rizatriptan Benzoate) .Marland Kitchen.. 1 By Mouth Once Daily Prn 9)  Zolpidem Tartrate 10 Mg Tabs (Zolpidem Tartrate) .... 1/2-1 Tab At Bedtime As Needed Insomnia 10)  Fluvoxamine Maleate 50 Mg Tabs (Fluvoxamine Maleate) .Marland Kitchen.. 1 By Mouth Bid  Allergies (verified): 1)  ! Codeine Sulfate (Codeine Sulfate) 2)  ! Vicodin (Hydrocodone-Acetaminophen) 3)  ! Ultram (Tramadol Hcl) 4)  ! Lyrica  (Pregabalin) 5)  ! Duragesic-25 (Fentanyl) 6)  Ceftin 7)  Macrobid  Physical Exam  General:  NAD Tired Mouth:  Erythematous throat and intranasal mucosa c/w URI  Lungs:  Normal respiratory effort, chest expands symmetrically. Lungs are clear to auscultation, no crackles or wheezes. Heart:  RRR   Impression & Recommendations:  Problem # 1:  SINUSITIS, ACUTE (ICD-461.9) Assessment New  Her updated medication list for this problem includes:    Augmentin 875-125 Mg Tabs (Amoxicillin-pot clavulanate) .Marland Kitchen... 1 by mouth bid  Complete Medication List: 1)  Oxycodone Hcl 15 Mg Tabs (Oxycodone hcl) .... 1/2 or 1 by mouth qid as needed pain please,  fill on or after  10/21/10          . ok to fill 1-2 day earlier when the month has 31 days in it. 2)  Ibuprofen 600 Mg Tabs (Ibuprofen) .... Take 1 tablet by mouth three times a day as needed pain 3)  Protonix 40 Mg Tbec (Pantoprazole sodium) .Marland Kitchen.. 1 once daily 4)  Vitamin D3 1000 Unit Tabs (Cholecalciferol) .Marland Kitchen.. 1 by mouth daily 5)  Topicort 0.25 % Crea (Desoximetasone) .... Use two times a day prn 6)  Sumatriptan Succinate 6 Mg/0.60ml Kit (Sumatriptan succinate) .Marland KitchenMarland KitchenMarland Kitchen  1 once daily as dirr prn 7)  Promethazine Hcl 25 Mg Tabs (Promethazine hcl) .Marland Kitchen.. 1-2 by mouth qid prn 8)  Maxalt-mlt 10 Mg Tbdp (Rizatriptan benzoate) .Marland Kitchen.. 1 by mouth once daily prn 9)  Zolpidem Tartrate 10 Mg Tabs (Zolpidem tartrate) .... 1/2-1 tab at bedtime as needed insomnia 10)  Fluvoxamine Maleate 50 Mg Tabs (Fluvoxamine maleate) .Marland Kitchen.. 1 by mouth bid 11)  Augmentin 875-125 Mg Tabs (Amoxicillin-pot clavulanate) .Marland Kitchen.. 1 by mouth bid  Patient Instructions: 1)  Use over-the-counter medicines for "cold": Tylenol  650mg  or Advil 400mg  every 6 hours  for fever; Delsym or Robutussin for cough. Mucinex or Mucinex D for congestion. Ricola or Halls for sore throat.  Prescriptions: AUGMENTIN 875-125 MG TABS (AMOXICILLIN-POT CLAVULANATE) 1 by mouth bid  #20 x 0   Entered and Authorized by:    Tresa Garter MD   Signed by:   Tresa Garter MD on 09/23/2010   Method used:   Electronically to        [facility]* (retail)       [address]       [place], [place]  [id number]       Ph: [phone]       Fax: [phone]   RxID:   [id number]    Orders Added: 1)  Est. Patient Level III [24401]

## 2010-11-28 NOTE — Progress Notes (Signed)
Summary: Zpak  Phone Note Call from Patient Call back at [facility] Phone [phone]   Summary of Call: Pt c/o sore throat and congestion. She is req zpak, does not want office visit b/c she was "just here".  Initial call taken by: Lamar Sprinkles, CMA,  Mar 15, 2010 9:27 AM  Follow-up for Phone Call        ok Zpac Follow-up by: Tresa Garter MD,  Mar 15, 2010 12:59 PM  Additional Follow-up for Phone Call Additional follow up Details #1::        Pt informed  Additional Follow-up by: Lamar Sprinkles, CMA,  Mar 15, 2010 3:19 PM    New/Updated Medications: ZITHROMAX Z-PAK 250 MG TABS (AZITHROMYCIN) as dirrected Prescriptions: ZITHROMAX Z-PAK 250 MG TABS (AZITHROMYCIN) as dirrected  #1 x 0   Entered by:   Lamar Sprinkles, CMA   Authorized by:   Tresa Garter MD   Signed by:   Lamar Sprinkles, CMA on 03/15/2010   Method used:   Electronically to        [facility]* (retail)       [address]       [place], [place]  [address]       Ph: [phone]       Fax: [phone]   RxID:   [id number] ZITHROMAX Z-PAK 250 MG TABS (AZITHROMYCIN) as dirrected  #1 x 0   Entered and Authorized by:   Tresa Garter MD   Signed by:   Lamar Sprinkles, CMA on 03/15/2010   Method used:   Electronically to        [facility] [phone]* (retail)       [address]       [place], [place]  [address]       Ph: [phone]       Fax: [phone]   RxID:   [id number]

## 2010-11-28 NOTE — Assessment & Plan Note (Signed)
Summary: 3 MO ROV /NWS  #   Vital Signs:  Patient profile:   50 year old female Height:      65 inches Weight:      152 pounds BMI:     25.39 O2 Sat:      97 % on Room air Temp:     98.1 degrees F oral Pulse rate:   71 / minute BP sitting:   120 / 82  (left arm) Cuff size:   regular  Vitals Entered By: Lucious Groves (Mar 07, 2010 8:24 AM)  O2 Flow:  Room air CC: 3 mo rtn ov./kb Is Patient Diabetic? No Pain Assessment Patient in pain? no        CC:  3 mo rtn ov./kb.  History of Present Illness: The patient presents for a wellness examination   Current Medications (verified): 1)  Ibuprofen 600 Mg  Tabs (Ibuprofen) .... Take 1 Tablet By Mouth Three Times A Day As Needed Pain 2)  Protonix 40 Mg  Tbec (Pantoprazole Sodium) .Marland Kitchen.. 1 Once Daily 3)  Vitamin D3 1000 Unit  Tabs (Cholecalciferol) .Marland Kitchen.. 1 By Mouth Daily 4)  Topicort 0.25 % Crea (Desoximetasone) .... Use Two Times A Day Prn 5)  Sumatriptan Succinate 6 Mg/0.61ml Kit (Sumatriptan Succinate) .Marland Kitchen.. 1 Once Daily As Dirr Prn 6)  Promethazine Hcl 25 Mg  Tabs (Promethazine Hcl) .Marland Kitchen.. 1-2 By Mouth Qid Prn 7)  Maxalt-Mlt 10 Mg Tbdp (Rizatriptan Benzoate) .Marland Kitchen.. 1 By Mouth Once Daily Prn 8)  Oxycodone Hcl 15 Mg  Tabs (Oxycodone Hcl) .... 1/2 or 1 By Mouth Qid As Needed Pain Fill On or After February 14, 2010 9)  Zolpidem Tartrate 10 Mg Tabs (Zolpidem Tartrate) .... 1/2-1 Tab At Bedtime As Needed Insomnia  Allergies (verified): 1)  ! Codeine Sulfate (Codeine Sulfate) 2)  ! Vicodin (Hydrocodone-Acetaminophen) 3)  ! Ultram (Tramadol Hcl) 4)  ! Lyrica (Pregabalin) 5)  ! Duragesic-25 (Fentanyl) 6)  Ceftin 7)  Macrobid  Past History:  Past Medical History: Last updated: 09/12/2008 Syringomyelia GERD Low back pain Dr Barbee [place] Subtle R weakness Migraines Pyelonephritis 2008 UTIs Stress 2009  Past Surgical History: Last updated: 04/11/2008 s/p intracranial decompression surgury  - [facility] - failed per pt  Family History: Last  updated: 09/29/2007 Family History of Anxiety COPD, PVD  M  Social History: Last updated: 09/12/2008 Married Never Smoked Alcohol use-no 2 children, one GS owner - small business  Review of Systems  The patient denies anorexia, fever, weight loss, weight gain, vision loss, decreased hearing, hoarseness, chest pain, syncope, dyspnea on exertion, peripheral edema, prolonged cough, headaches, hemoptysis, abdominal pain, melena, hematochezia, severe indigestion/heartburn, hematuria, incontinence, genital sores, muscle weakness, suspicious skin lesions, transient blindness, difficulty walking, depression, unusual weight change, abnormal bleeding, enlarged lymph nodes, angioedema, and breast masses.         LBP  Physical Exam  General:  Looks well, NAD Head:  normocephalic and atraumatic.   Eyes:  vision grossly intact, pupils equal, pupils round, and pupils reactive to light.   Ears:  External ear exam shows no significant lesions or deformities.  Otoscopic examination reveals  tympanic membranes are intact bilaterally without bulging, retraction; there is  slight inflammation and discharge. Hearing is grossly normal bilaterally. Nose:  External nasal examination shows no deformity or inflammation. Nasal mucosa are pink and moist without lesions or exudates. Mouth:  Oral mucosa and oropharynx without lesions or exudates.  Teeth in good repair. Neck:  supple and no masses.   Lungs:  Normal respiratory effort, chest expands symmetrically. Lungs are clear to auscultation, no crackles or wheezes. Heart:  RRR Abdomen:  S/NT Msk:   Lt side of body hurts Pulses:  R and L carotid,radial,femoral,dorsalis pedis and posterior tibial pulses are full and equal bilaterally Extremities:  No clubbing, cyanosis, edema, or deformity noted with normal full range of motion of all joints.   Neurologic:  No cranial nerve deficits noted. Station and gait are normal. Plantar reflexes are down-going bilaterally.  DTRs are symmetrical throughout. Sensory, motor and coordinative functions appear intact. Skin:  Intact without suspicious lesions or rashes Cervical Nodes:  No lymphadenopathy noted Psych:  Oriented X3 and normally interactive.     Impression & Recommendations:  Problem # 1:  PHYSICAL EXAMINATION (ICD-V70.0) Assessment New Health and age related issues were discussed. Available screening tests and vaccinations were discussed as well. Healthy life style including good diet and execise was discussed.  The labs were reviewed with the patient.  GYN q 12 months  Colon due  Problem # 2:  PARESTHESIA (ICD-782.0) R hand new Assessment: New Contour pillow  Problem # 3:  MIGRAINE UNSP W/INTRACTBL W/O STATUS MIGRAINOSUS (ICD-346.91) Assessment: Comment Only  Her updated medication list for this problem includes:    Oxycodone Hcl 15 Mg Tabs (Oxycodone hcl) .Marland Kitchen... 1/2 or 1 by mouth qid as needed pain fill on or after May 16, 2010    Ibuprofen 600 Mg Tabs (Ibuprofen) .Marland Kitchen... Take 1 tablet by mouth three times a day as needed pain    Sumatriptan Succinate 6 Mg/0.88ml Kit (Sumatriptan succinate) .Marland Kitchen... 1 once daily as dirr prn    Maxalt-mlt 10 Mg Tbdp (Rizatriptan benzoate) .Marland Kitchen... 1 by mouth once daily prn  Problem # 4:  BACK PAIN (ICD-724.5) Assessment: Comment Only  Her updated medication list for this problem includes:    Oxycodone Hcl 15 Mg Tabs (Oxycodone hcl) .Marland Kitchen... 1/2 or 1 by mouth qid as needed pain fill on or after May 16, 2010    Ibuprofen 600 Mg Tabs (Ibuprofen) .Marland Kitchen... Take 1 tablet by mouth three times a day as needed pain  Problem # 5:  OTITIS EXTERNA (ICD-380.10) B Assessment: New  Her updated medication list for this problem includes:    Cortisporin 3.5-10000-1 Soln (Neomycin-polymyxin-hc) .Marland KitchenMarland KitchenMarland KitchenMarland Kitchen 3 gtt in r ear tid  Complete Medication List: 1)  Oxycodone Hcl 15 Mg Tabs (Oxycodone hcl) .... 1/2 or 1 by mouth qid as needed pain fill on or after May 16, 2010 2)  Ibuprofen 600 Mg  Tabs (Ibuprofen) .... Take 1 tablet by mouth three times a day as needed pain 3)  Protonix 40 Mg Tbec (Pantoprazole sodium) .Marland Kitchen.. 1 once daily 4)  Vitamin D3 1000 Unit Tabs (Cholecalciferol) .Marland Kitchen.. 1 by mouth daily 5)  Topicort 0.25 % Crea (Desoximetasone) .... Use two times a day prn 6)  Sumatriptan Succinate 6 Mg/0.32ml Kit (Sumatriptan succinate) .Marland Kitchen.. 1 once daily as dirr prn 7)  Promethazine Hcl 25 Mg Tabs (Promethazine hcl) .Marland Kitchen.. 1-2 by mouth qid prn 8)  Maxalt-mlt 10 Mg Tbdp (Rizatriptan benzoate) .Marland Kitchen.. 1 by mouth once daily prn 9)  Zolpidem Tartrate 10 Mg Tabs (Zolpidem tartrate) .... 1/2-1 tab at bedtime as needed insomnia 10)  Cortisporin 3.5-10000-1 Soln (Neomycin-polymyxin-hc) .... 3 gtt in r ear tid  Patient Instructions: 1)  Contour pillow 2)  Please schedule a follow-up appointment in 3 months. Prescriptions: CORTISPORIN 3.5-10000-1 SOLN (NEOMYCIN-POLYMYXIN-HC) 3 gtt in R ear tid  #1 x 1   Entered and Authorized by:  Tresa Garter MD   Signed by:   Tresa Garter MD on 03/07/2010   Method used:   Electronically to        [facility]* (retail)       [address]       [place], [place]  [id number]       Ph: [phone]       Fax: [phone]   RxID:   [id number] OXYCODONE HCL 15 MG  TABS (OXYCODONE HCL) 1/2 or 1 by mouth qid as needed pain Fill on or after May 16, 2010  #120 x 0   Entered and Authorized by:   Tresa Garter MD   Signed by:   Tresa Garter MD on 03/07/2010   Method used:   Print then Give to Patient   RxID:   [id number] OXYCODONE HCL 15 MG  TABS (OXYCODONE HCL) 1/2 or 1 by mouth qid as needed pain Fill on or after April 16, 2010  #120 x 0   Entered and Authorized by:   Tresa Garter MD   Signed by:   Tresa Garter MD on 03/07/2010   Method used:   Print then Give to Patient   RxID:   [id number] OXYCODONE HCL 15 MG  TABS (OXYCODONE HCL) 1/2 or 1 by mouth qid as needed pain Fill on or after Mar 16, 2010  #120 x 0   Entered and Authorized by:   Tresa Garter MD   Signed by:   Tresa Garter MD on 03/07/2010   Method used:   Print then Give to Patient   RxID:   [id number]

## 2010-11-28 NOTE — Progress Notes (Signed)
  Phone Note Refill Request   Refills Requested: Medication #1:  IBUPROFEN 600 MG  TABS Take 1 tablet by mouth three times a day as needed pain  Medication #2:  OXYCODONE HCL 15 MG  TABS 1/2 or 1 by mouth qid as needed pain Fill on or after 12/04/2009 pt is requesting refills on these medications, Please Advise  Initial call taken by: Ami Bullins CMA,  January 07, 2010 10:32 AM  Follow-up for Phone Call        done hardcopy to LIM side B - dahlia  Follow-up by: Corwin Levins MD,  January 07, 2010 12:50 PM  Additional Follow-up for Phone Call Additional follow up Details #1::        pt informed via VM that RX are ready for pick up, in cabinet up front. Additional Follow-up by: Margaret Pyle, CMA,  January 07, 2010 1:42 PM    New/Updated Medications: IBUPROFEN 600 MG  TABS (IBUPROFEN) Take 1 tablet by mouth three times a day as needed pain OXYCODONE HCL 15 MG  TABS (OXYCODONE HCL) 1/2 or 1 by mouth qid as needed pain Fill on or after Jan 07, 2010 Prescriptions: OXYCODONE HCL 15 MG  TABS (OXYCODONE HCL) 1/2 or 1 by mouth qid as needed pain Fill on or after Jan 07, 2010  #120 x 0   Entered and Authorized by:   Corwin Levins MD   Signed by:   Corwin Levins MD on 01/07/2010   Method used:   Print then Give to Patient   RxID:   [id number] IBUPROFEN 600 MG  TABS (IBUPROFEN) Take 1 tablet by mouth three times a day as needed pain  #90 x 2   Entered and Authorized by:   Corwin Levins MD   Signed by:   Corwin Levins MD on 01/07/2010   Method used:   Print then Give to Patient   RxID:   [id number]

## 2010-11-28 NOTE — Progress Notes (Signed)
Summary: REFILL  Phone Note Refill Request Call back at Home Phone [phone]   Refills Requested: Medication #1:  OXYCODONE HCL 15 MG  TABS 1/2 or 1 by mouth qid as needed pain Fill on or after mar 14 Initial call taken by: Lamar Sprinkles, CMA,  February 13, 2010 2:04 PM  Follow-up for Phone Call        ok to ref Follow-up by: Tresa Garter MD,  February 13, 2010 10:31 PM  Additional Follow-up for Phone Call Additional follow up Details #1::        Left mess for pt to pick up rx Additional Follow-up by: Lamar Sprinkles, CMA,  February 14, 2010 2:42 PM    New/Updated Medications: OXYCODONE HCL 15 MG  TABS (OXYCODONE HCL) 1/2 or 1 by mouth qid as needed pain Fill on or after February 14, 2010 Prescriptions: OXYCODONE HCL 15 MG  TABS (OXYCODONE HCL) 1/2 or 1 by mouth qid as needed pain Fill on or after February 14, 2010  #120 x 0   Entered by:   Lamar Sprinkles, CMA   Authorized by:   Tresa Garter MD   Signed by:   Lamar Sprinkles, CMA on 02/14/2010   Method used:   Print then Give to Patient   RxID:   [id number]

## 2010-11-28 NOTE — Progress Notes (Signed)
Summary: Oxycodone request  Phone Note Call from Patient Call back at Work Phone [phone]   Summary of Call: Patient left message on triage that she has misplaced her july prescription for oxycodone and has taken her last one. Patient would like to come pick up another prescription. Please advise. Initial call taken by: Lucious Groves CMA,  May 20, 2010 8:45 AM  Follow-up for Phone Call        Patient left message on triage to call her at the work #. Lucious Groves CMA  May 20, 2010 11:51 AM   Additional Follow-up for Phone Call Additional follow up Details #1::        Done. Additional Follow-up by: Tresa Garter MD,  May 20, 2010 5:13 PM    Additional Follow-up for Phone Call Additional follow up Details #2::    left detailed mess informing pt Rx will be ready for p/u tom AM  Follow-up by: Lanier Prude, [facility]),  May 20, 2010 5:15 PM  New/Updated Medications: OXYCODONE HCL 15 MG  TABS (OXYCODONE HCL) 1/2 or 1 by mouth qid as needed pain Prescriptions: OXYCODONE HCL 15 MG  TABS (OXYCODONE HCL) 1/2 or 1 by mouth qid as needed pain  #120 x 0   Entered and Authorized by:   Tresa Garter MD   Signed by:   Tresa Garter MD on 05/20/2010   Method used:   Print then Give to Patient   RxID:   [id number]

## 2010-11-28 NOTE — Assessment & Plan Note (Signed)
Summary: 3 MTH FU  STC   Vital Signs:  Patient profile:   50 year old female Height:      65 inches Weight:      153 pounds BMI:     25.55 Temp:     97.8 degrees F oral Pulse rate:   84 / minute Pulse rhythm:   regular Resp:     16 per minute BP sitting:   120 / 90  (left arm) Cuff size:   regular  Vitals Entered By: Lanier Prude, CMA(AAMA) (November 22, 2010 9:21 AM) CC: 3 mo f/u  Is Patient Diabetic? No   CC:  3 mo f/u .  History of Present Illness: The patient presents for a follow up of back pain, anxiety, depression and headaches (bad migraines 3/month). HA resolved when she took cosmetic Botox  Current Medications (verified): 1)  Oxycodone Hcl 15 Mg  Tabs (Oxycodone Hcl) .... 1/2 or 1 By Mouth Qid As Needed Pain Please,  Fill On or After  10/21/10          . Ok To Fill 1-2 Day Earlier When The Month Has 31 Days in It. 2)  Ibuprofen 600 Mg  Tabs (Ibuprofen) .... Take 1 Tablet By Mouth Three Times A Day As Needed Pain 3)  Protonix 40 Mg  Tbec (Pantoprazole Sodium) .Marland Kitchen.. 1 Once Daily 4)  Vitamin D3 1000 Unit  Tabs (Cholecalciferol) .Marland Kitchen.. 1 By Mouth Daily 5)  Topicort 0.25 % Crea (Desoximetasone) .... Use Two Times A Day Prn 6)  Sumatriptan Succinate 6 Mg/0.29ml Kit (Sumatriptan Succinate) .Marland Kitchen.. 1 Once Daily As Dirr Prn 7)  Promethazine Hcl 25 Mg  Tabs (Promethazine Hcl) .Marland Kitchen.. 1-2 By Mouth Qid Prn 8)  Maxalt-Mlt 10 Mg Tbdp (Rizatriptan Benzoate) .Marland Kitchen.. 1 By Mouth Once Daily Prn 9)  Zolpidem Tartrate 10 Mg Tabs (Zolpidem Tartrate) .... 1/2-1 Tab At Bedtime As Needed Insomnia 10)  Fluvoxamine Maleate 50 Mg Tabs (Fluvoxamine Maleate) .Marland Kitchen.. 1 By Mouth Bid  Allergies (verified): 1)  ! Codeine Sulfate (Codeine Sulfate) 2)  ! Vicodin (Hydrocodone-Acetaminophen) 3)  ! Ultram (Tramadol Hcl) 4)  ! Lyrica (Pregabalin) 5)  ! Duragesic-25 (Fentanyl) 6)  Ceftin 7)  Macrobid  Past History:  Past Medical History: Last updated: 09/12/2008 Syringomyelia GERD Low back pain Dr  Barbee [place] Subtle R weakness Migraines Pyelonephritis 2008 UTIs Stress 2009  Social History: Last updated: 09/12/2008 Married Never Smoked Alcohol use-no 2 children, one GS owner - small business  Review of Systems  The patient denies weight loss, syncope, dyspnea on exertion, and abdominal pain.    Physical Exam  General:  NAD Tired Head:  normocephalic and atraumatic.   Mouth:  Erythematous throat and intranasal mucosa c/w URI  Neck:  supple and no masses.   Lungs:  Normal respiratory effort, chest expands symmetrically. Lungs are clear to auscultation, no crackles or wheezes. Heart:  RRR Abdomen:  S/NT Msk:   Lt side of body hurts Neurologic:  No cranial nerve deficits noted. Station and gait are normal. Plantar reflexes are down-going bilaterally. DTRs are symmetrical throughout. Sensory, motor and coordinative functions appear intact. Skin:  Intact without suspicious lesions or rashes Psych:  Oriented X3 and normally interactive.     Impression & Recommendations:  Problem # 1:  MIGRAINE UNSP W/INTRACTBL W/O STATUS MIGRAINOSUS (ICD-346.91) Assessment Deteriorated try Topamax Her updated medication list for this problem includes:    Oxycodone Hcl 15 Mg Tabs (Oxycodone hcl) .Marland Kitchen... 1/2 or 1 by mouth qid as needed pain please,  fill on or after  01/20/11         . ok to fill 1-2 day earlier when the month has 31 days in it.    Ibuprofen 600 Mg Tabs (Ibuprofen) .Marland Kitchen... Take 1 tablet by mouth three times a day as needed pain    Sumatriptan Succinate 6 Mg/0.71ml Kit (Sumatriptan succinate) .Marland Kitchen... 1 once daily as dirr prn    Maxalt-mlt 10 Mg Tbdp (Rizatriptan benzoate) .Marland Kitchen... 1 by mouth once daily prn  Orders: Neurology Referral (Neuro) Dr Terrace Arabia - can she try Botox inj  Problem # 2:  SYRINGOMYELIA (ICD-336.0) Assessment: Unchanged Neurol consult is pending   Problem # 3:  NECK PAIN (ICD-723.1) Assessment: Unchanged  Her updated medication list for this problem includes:     Oxycodone Hcl 15 Mg Tabs (Oxycodone hcl) .Marland Kitchen... 1/2 or 1 by mouth qid as needed pain please,  fill on or after  01/20/11         . ok to fill 1-2 day earlier when the month has 31 days in it.    Ibuprofen 600 Mg Tabs (Ibuprofen) .Marland Kitchen... Take 1 tablet by mouth three times a day as needed pain  Problem # 4:  LOW BACK PAIN (ICD-724.2) Assessment: Unchanged  Her updated medication list for this problem includes:    Oxycodone Hcl 15 Mg Tabs (Oxycodone hcl) .Marland Kitchen... 1/2 or 1 by mouth qid as needed pain please,  fill on or after  01/20/11         . ok to fill 1-2 day earlier when the month has 31 days in it.    Ibuprofen 600 Mg Tabs (Ibuprofen) .Marland Kitchen... Take 1 tablet by mouth three times a day as needed pain  Problem # 5:  OBSESSIVE-COMPULSIVE DISORDER (ICD-300.3) Assessment: Improved On the regimen of medicine(s) reflected in the chart    Complete Medication List: 1)  Oxycodone Hcl 15 Mg Tabs (Oxycodone hcl) .... 1/2 or 1 by mouth qid as needed pain please,  fill on or after  01/20/11         . ok to fill 1-2 day earlier when the month has 31 days in it. 2)  Ibuprofen 600 Mg Tabs (Ibuprofen) .... Take 1 tablet by mouth three times a day as needed pain 3)  Protonix 40 Mg Tbec (Pantoprazole sodium) .Marland Kitchen.. 1 once daily 4)  Vitamin D3 1000 Unit Tabs (Cholecalciferol) .Marland Kitchen.. 1 by mouth daily 5)  Topicort 0.25 % Crea (Desoximetasone) .... Use two times a day prn 6)  Sumatriptan Succinate 6 Mg/0.28ml Kit (Sumatriptan succinate) .Marland Kitchen.. 1 once daily as dirr prn 7)  Promethazine Hcl 25 Mg Tabs (Promethazine hcl) .Marland Kitchen.. 1-2 by mouth qid prn 8)  Maxalt-mlt 10 Mg Tbdp (Rizatriptan benzoate) .Marland Kitchen.. 1 by mouth once daily prn 9)  Zolpidem Tartrate 10 Mg Tabs (Zolpidem tartrate) .... 1/2-1 tab at bedtime as needed insomnia 10)  Fluvoxamine Maleate 50 Mg Tabs (Fluvoxamine maleate) .Marland Kitchen.. 1 by mouth bid 11)  Topamax 25 Mg Tabs (Topiramate) .Marland Kitchen.. 1 by mouth bid  Patient Instructions: 1)  Please schedule a follow-up appointment in 3 months  well w/labs and B12 782.0. Prescriptions: OXYCODONE HCL 15 MG  TABS (OXYCODONE HCL) 1/2 or 1 by mouth qid as needed pain Please,  fill on or after  01/20/11         . OK to fill 1-2 day earlier when the month has 31 days in it.  #120 x 0   Entered and Authorized by:   Georgina Quint Plotnikov  MD   Signed by:   Tresa Garter MD on 11/22/2010   Method used:   Print then Give to Patient   RxID:   [id number] OXYCODONE HCL 15 MG  TABS (OXYCODONE HCL) 1/2 or 1 by mouth qid as needed pain Please,  fill on or after  12/22/10         . OK to fill 1-2 day earlier when the month has 31 days in it.  #120 x 0   Entered and Authorized by:   Tresa Garter MD   Signed by:   Tresa Garter MD on 11/22/2010   Method used:   Print then Give to Patient   RxID:   [id number] OXYCODONE HCL 15 MG  TABS (OXYCODONE HCL) 1/2 or 1 by mouth qid as needed pain Please,  fill on or after  11/21/10         . OK to fill 1-2 day earlier when the month has 31 days in it.  #120 x 0   Entered and Authorized by:   Tresa Garter MD   Signed by:   Tresa Garter MD on 11/22/2010   Method used:   Print then Give to Patient   RxID:   [id number] TOPAMAX 25 MG TABS (TOPIRAMATE) 1 by mouth bid  #60 x 6   Entered and Authorized by:   Tresa Garter MD   Signed by:   Tresa Garter MD on 11/22/2010   Method used:   Print then Give to Patient   RxID:   [id number]    Orders Added: 1)  Neurology Referral [Neuro] 2)  Est. Patient Level IV [85462]

## 2010-11-28 NOTE — Assessment & Plan Note (Signed)
Summary: 2mos f/u // #/cd   Vital Signs:  Patient profile:   50 year old female Weight:      157 pounds BMI:     26.22 Temp:     98 degrees F oral Pulse rate:   81 / minute BP sitting:   110 / 80  (left arm)  Vitals Entered By: Tora Perches (December 07, 2009 8:37 AM) CC: f/u Is Patient Diabetic? No   CC:  f/u.  History of Present Illness: The patient presents for a follow up of back pain, insomnia, elev LFTs and headaches. Lost wt on diet!  Preventive Screening-Counseling & Management  Alcohol-Tobacco     Smoking Status: never  Allergies: 1)  ! Codeine Sulfate (Codeine Sulfate) 2)  ! Vicodin (Hydrocodone-Acetaminophen) 3)  ! Ultram (Tramadol Hcl) 4)  ! Lyrica (Pregabalin) 5)  ! Duragesic-25 (Fentanyl) 6)  Ceftin 7)  Macrobid  Past History:  Past Medical History: Last updated: 09/12/2008 Syringomyelia GERD Low back pain Dr Barbee [place] Subtle R weakness Migraines Pyelonephritis 2008 UTIs Stress 2009  Social History: Last updated: 09/12/2008 Married Never Smoked Alcohol use-no 2 children, one GS owner - small business  Family History: Reviewed history from 09/29/2007 and no changes required. Family History of Anxiety COPD, PVD  M  Social History: Reviewed history from 09/12/2008 and no changes required. Married Never Smoked Alcohol use-no 2 children, one GS owner - small business  Review of Systems  The patient denies chest pain, syncope, dyspnea on exertion, and muscle weakness.         Lost wt on diet  Physical Exam  General:  Looks well, NAD Nose:  External nasal examination shows no deformity or inflammation. Nasal mucosa are pink and moist without lesions or exudates. Mouth:  not pale or dry Abdomen:  S/NT Msk:   Lt side of body hurts Neurologic:  alert & oriented X3, cranial nerves II-XII intact, strength normal in all extremities, sensation intact to light touch, gait normal, DTRs symmetrical and normal, heel-to-shin normal,  toes down bilaterally on Babinski, and Romberg negative.   Skin:  Intact without suspicious lesions or rashes Psych:  Oriented X3 and normally interactive.     Contraindications/Deferment of Procedures/Staging:    Test/Procedure: FLU VAX    Reason for deferment: patient declined   Impression & Recommendations:  Problem # 1:  WEIGHT GAIN (ICD-783.1) Assessment Improved Done great!  Problem # 2:  SYRINGOMYELIA (ICD-336.0) Assessment: Comment Only On prescription therapy   Problem # 3:  BACK PAIN (ICD-724.5) Assessment: Improved  Her updated medication list for this problem includes:    Ibuprofen 600 Mg Tabs (Ibuprofen) .Marland Kitchen... Take 1 tablet by mouth three times a day as needed pain    Oxycodone Hcl 15 Mg Tabs (Oxycodone hcl) .Marland Kitchen... 1/2 or 1 by mouth qid as needed pain fill on or after 12/04/2009  Problem # 4:  URINARY TRACT INFECTION (UTI) (ICD-599.0) recurrent Assessment: Comment Only  The following medications were removed from the medication list:    Vesicare 5 Mg Tabs (Solifenacin succinate) .Marland Kitchen... 1 by mouth daily Her updated medication list for this problem includes:    Ciprofloxacin Hcl 250 Mg Tabs (Ciprofloxacin hcl) .Marland Kitchen... 1 by mouth two times a day for cystitis  Complete Medication List: 1)  Ibuprofen 600 Mg Tabs (Ibuprofen) .... Take 1 tablet by mouth three times a day as needed pain 2)  Protonix 40 Mg Tbec (Pantoprazole sodium) .Marland Kitchen.. 1 once daily 3)  Promethazine Hcl 25 Mg Supp (Promethazine hcl) .Marland KitchenMarland KitchenMarland Kitchen  1 pr qid as needed nausea 4)  Vitamin D3 1000 Unit Tabs (Cholecalciferol) .Marland Kitchen.. 1 by mouth daily 5)  Topicort 0.25 % Crea (Desoximetasone) .... Use two times a day prn 6)  Sumatriptan Succinate 6 Mg/0.84ml Kit (Sumatriptan succinate) .Marland Kitchen.. 1 once daily as dirr prn 7)  Promethazine Hcl 25 Mg Tabs (Promethazine hcl) .Marland Kitchen.. 1-2 by mouth qid prn 8)  Maxalt-mlt 10 Mg Tbdp (Rizatriptan benzoate) .Marland Kitchen.. 1 by mouth once daily prn 9)  Oxycodone Hcl 15 Mg Tabs (Oxycodone hcl) .... 1/2 or 1  by mouth qid as needed pain fill on or after 12/04/2009 10)  Zolpidem Tartrate 10 Mg Tabs (Zolpidem tartrate) .... 1/2-1 tab at bedtime as needed insomnia 11)  Ciprofloxacin Hcl 250 Mg Tabs (Ciprofloxacin hcl) .Marland Kitchen.. 1 by mouth two times a day for cystitis  Patient Instructions: 1)  Please schedule a follow-up appointment in 3 months. 2)  BMP prior to visit, ICD-9: 3)  Hepatic Panel prior to visit, ICD-9:  995.20 4)  Lipid Panel prior to visit, ICD-9: 5)  TSH prior to visit, ICD-9: 6)  CBC w/ Diff prior to visit, ICD-9: 7)  Vit B12 266.20 Prescriptions: CIPROFLOXACIN HCL 250 MG TABS (CIPROFLOXACIN HCL) 1 by mouth two times a day for cystitis  #10 x 3   Entered and Authorized by:   Tresa Garter MD   Signed by:   Tresa Garter MD on 12/07/2009   Method used:   Print then Give to Patient   RxID:   [id number] ZOLPIDEM TARTRATE 10 MG TABS (ZOLPIDEM TARTRATE) 1/2-1 tab at bedtime as needed insomnia  #30 x 6   Entered and Authorized by:   Tresa Garter MD   Signed by:   Tresa Garter MD on 12/07/2009   Method used:   Print then Give to Patient   RxID:   [id number] SUMATRIPTAN SUCCINATE 6 MG/0.5ML KIT (SUMATRIPTAN SUCCINATE) 1 once daily as dirr prn  #6 x 6   Entered and Authorized by:   Tresa Garter MD   Signed by:   Tresa Garter MD on 12/07/2009   Method used:   Print then Give to Patient   RxID:   [id number] OXYCODONE HCL 15 MG  TABS (OXYCODONE HCL) 1/2 or 1 by mouth qid as needed pain Fill on or after 12/04/2009  #120 x 0   Entered and Authorized by:   Tresa Garter MD   Signed by:   Tresa Garter MD on 12/07/2009   Method used:   Print then Give to Patient   RxID:   [id number] PROTONIX 40 MG  TBEC (PANTOPRAZOLE SODIUM) 1 once daily  #30 x 12   Entered and Authorized by:   Tresa Garter MD   Signed by:   Tresa Garter MD on 12/07/2009   Method used:   Print then Give to Patient   RxID:    [id number]

## 2010-11-28 NOTE — Assessment & Plan Note (Signed)
Summary: 3 mos f/u // #/ cd   Vital Signs:  Patient profile:   50 year old female Height:      65 inches Weight:      169 pounds BMI:     28.22 O2 Sat:      97 % on Room air Temp:     98.1 degrees F oral Pulse rate:   74 / minute Pulse rhythm:   regular Resp:     16 per minute BP sitting:   110 / 78  (left arm) Cuff size:   regular  Vitals Entered By: Lanier Prude, CMA(AAMA) (May 23, 2010 11:08 AM)  O2 Flow:  Room air CC: 3 mo f/u  Is Patient Diabetic? No   CC:  3 mo f/u .  History of Present Illness: The patient presents for a follow up of back pain and headaches C/o compulsive shopping x years; marriage strain...    Current Medications (verified): 1)  Oxycodone Hcl 15 Mg  Tabs (Oxycodone Hcl) .... 1/2 or 1 By Mouth Qid As Needed Pain 2)  Ibuprofen 600 Mg  Tabs (Ibuprofen) .... Take 1 Tablet By Mouth Three Times A Day As Needed Pain 3)  Protonix 40 Mg  Tbec (Pantoprazole Sodium) .Marland Kitchen.. 1 Once Daily 4)  Vitamin D3 1000 Unit  Tabs (Cholecalciferol) .Marland Kitchen.. 1 By Mouth Daily 5)  Topicort 0.25 % Crea (Desoximetasone) .... Use Two Times A Day Prn 6)  Sumatriptan Succinate 6 Mg/0.68ml Kit (Sumatriptan Succinate) .Marland Kitchen.. 1 Once Daily As Dirr Prn 7)  Promethazine Hcl 25 Mg  Tabs (Promethazine Hcl) .Marland Kitchen.. 1-2 By Mouth Qid Prn 8)  Maxalt-Mlt 10 Mg Tbdp (Rizatriptan Benzoate) .Marland Kitchen.. 1 By Mouth Once Daily Prn 9)  Zolpidem Tartrate 10 Mg Tabs (Zolpidem Tartrate) .... 1/2-1 Tab At Bedtime As Needed Insomnia  Allergies (verified): 1)  ! Codeine Sulfate (Codeine Sulfate) 2)  ! Vicodin (Hydrocodone-Acetaminophen) 3)  ! Ultram (Tramadol Hcl) 4)  ! Lyrica (Pregabalin) 5)  ! Duragesic-25 (Fentanyl) 6)  Ceftin 7)  Macrobid  Past History:  Past Medical History: Last updated: 09/12/2008 Syringomyelia GERD Low back pain Dr Barbee [place] Subtle R weakness Migraines Pyelonephritis 2008 UTIs Stress 2009  Past Surgical History: Last updated: 04/11/2008 s/p intracranial decompression surgury   - [facility] - failed per pt  Social History: Last updated: 09/12/2008 Married Never Smoked Alcohol use-no 2 children, one GS owner - small business  Review of Systems  The patient denies fever, syncope, abdominal pain, and depression.    Physical Exam  General:  Looks well, NAD Head:  normocephalic and atraumatic.   Nose:  External nasal examination shows no deformity or inflammation. Nasal mucosa are pink and moist without lesions or exudates. Mouth:  Oral mucosa and oropharynx without lesions or exudates.  Teeth in good repair. Neck:  supple and no masses.   Lungs:  Normal respiratory effort, chest expands symmetrically. Lungs are clear to auscultation, no crackles or wheezes. Heart:  RRR Abdomen:  S/NT Msk:   Lt side of body hurts Extremities:  No clubbing, cyanosis, edema, or deformity noted with normal full range of motion of all joints.   Neurologic:  No cranial nerve deficits noted. Station and gait are normal. Plantar reflexes are down-going bilaterally. DTRs are symmetrical throughout. Sensory, motor and coordinative functions appear intact. Skin:  Intact without suspicious lesions or rashes Cervical Nodes:  No lymphadenopathy noted Psych:  Oriented X3 and normally interactive.     Impression & Recommendations:  Problem # 1:  SYRINGOMYELIA (ICD-336.0) Assessment  Unchanged On Rx  Problem # 2:  OBSESSIVE-COMPULSIVE DISORDER (ICD-300.3) Assessment: Deteriorated Start Luvox Orders: Psychology Referral (Psychology)  Problem # 3:  MIGRAINE UNSP W/INTRACTBL W/O STATUS MIGRAINOSUS (ICD-346.91) Assessment: Improved  Her updated medication list for this problem includes:    Oxycodone Hcl 15 Mg Tabs (Oxycodone hcl) .Marland Kitchen... 1/2 or 1 by mouth qid as needed pain please,  fill on or after  07/22/10          . ok to fill 1-2 day earlier when the month has 31 days in it.    Ibuprofen 600 Mg Tabs (Ibuprofen) .Marland Kitchen... Take 1 tablet by mouth three times a day as needed pain     Sumatriptan Succinate 6 Mg/0.55ml Kit (Sumatriptan succinate) .Marland Kitchen... 1 once daily as dirr prn    Maxalt-mlt 10 Mg Tbdp (Rizatriptan benzoate) .Marland Kitchen... 1 by mouth once daily prn  Problem # 4:  BACK PAIN (ICD-724.5) Assessment: Unchanged  Her updated medication list for this problem includes:    Oxycodone Hcl 15 Mg Tabs (Oxycodone hcl) .Marland Kitchen... 1/2 or 1 by mouth qid as needed pain please,  fill on or after  07/22/10          . ok to fill 1-2 day earlier when the month has 31 days in it.    Ibuprofen 600 Mg Tabs (Ibuprofen) .Marland Kitchen... Take 1 tablet by mouth three times a day as needed pain  Complete Medication List: 1)  Oxycodone Hcl 15 Mg Tabs (Oxycodone hcl) .... 1/2 or 1 by mouth qid as needed pain please,  fill on or after  07/22/10          . ok to fill 1-2 day earlier when the month has 31 days in it. 2)  Ibuprofen 600 Mg Tabs (Ibuprofen) .... Take 1 tablet by mouth three times a day as needed pain 3)  Protonix 40 Mg Tbec (Pantoprazole sodium) .Marland Kitchen.. 1 once daily 4)  Vitamin D3 1000 Unit Tabs (Cholecalciferol) .Marland Kitchen.. 1 by mouth daily 5)  Topicort 0.25 % Crea (Desoximetasone) .... Use two times a day prn 6)  Sumatriptan Succinate 6 Mg/0.61ml Kit (Sumatriptan succinate) .Marland Kitchen.. 1 once daily as dirr prn 7)  Promethazine Hcl 25 Mg Tabs (Promethazine hcl) .Marland Kitchen.. 1-2 by mouth qid prn 8)  Maxalt-mlt 10 Mg Tbdp (Rizatriptan benzoate) .Marland Kitchen.. 1 by mouth once daily prn 9)  Zolpidem Tartrate 10 Mg Tabs (Zolpidem tartrate) .... 1/2-1 tab at bedtime as needed insomnia 10)  Fluvoxamine Maleate 50 Mg Tabs (Fluvoxamine maleate) .Marland Kitchen.. 1 by mouth bid  Patient Instructions: 1)  Please schedule a follow-up appointment in 3 months. Prescriptions: OXYCODONE HCL 15 MG  TABS (OXYCODONE HCL) 1/2 or 1 by mouth qid as needed pain Please,  fill on or after  07/22/10          . OK to fill 1-2 day earlier when the month has 31 days in it.  #120 x 0   Entered and Authorized by:   Tresa Garter MD   Signed by:   Tresa Garter MD on  05/23/2010   Method used:   Print then Give to Patient   RxID:   [id number] OXYCODONE HCL 15 MG  TABS (OXYCODONE HCL) 1/2 or 1 by mouth qid as needed pain Please,  fill on or after  06/21/10          . OK to fill 1-2 day earlier when the month has 31 days in it.  #120 x 0   Entered and Authorized by:  Tresa Garter MD   Signed by:   Tresa Garter MD on 05/23/2010   Method used:   Print then Give to Patient   RxID:   [id number] FLUVOXAMINE MALEATE 50 MG TABS (FLUVOXAMINE MALEATE) 1 by mouth bid  #60 x 6   Entered and Authorized by:   Tresa Garter MD   Signed by:   Tresa Garter MD on 05/23/2010   Method used:   Electronically to        [facility]* (retail)       [address]       [place], [place]  [id number]       Ph: [phone]       Fax: [phone]   RxID:   [id number]

## 2010-12-09 ENCOUNTER — Other Ambulatory Visit: Payer: Self-pay | Admitting: Obstetrics & Gynecology

## 2010-12-09 DIAGNOSIS — Z1231 Encounter for screening mammogram for malignant neoplasm of breast: Secondary | ICD-10-CM

## 2010-12-12 ENCOUNTER — Encounter (INDEPENDENT_AMBULATORY_CARE_PROVIDER_SITE_OTHER): Payer: Self-pay | Admitting: *Deleted

## 2010-12-18 NOTE — Letter (Addendum)
Summary: Primary Care Consult Scheduled Letter  [facility]  [address] [place], [place] [address]   Phone: [phone]  Fax: [phone]      12/12/2010 MRN: [id number]  [facility] [place] [address] [place], [place]  [address]    Dear Ms. Strassman,      We have scheduled an appointment for you. At the recommendation of Dr.Plotnikov, we have scheduled you a consult with (Dr [facility] on April 19.2012 at 10:00 AM . Their address is [address], [address], [place], [place] [address]. The office phone number is [phone]. If this appointment day and time is not convenient for you, please feel free to call the office of the doctor you are being referred to at the number listed above and reschedule the appointment.     It is important for you to keep your scheduled appointments. We are here to make sure you are given good patient care. If you have questions or you have made changes to your appointment, please notify us at  [phone]        , ask for Debra     Thank you,  Patient Care Coordinator  [facility]

## 2010-12-19 ENCOUNTER — Other Ambulatory Visit: Payer: Self-pay | Admitting: Obstetrics & Gynecology

## 2010-12-19 ENCOUNTER — Ambulatory Visit
Admission: RE | Admit: 2010-12-19 | Discharge: 2010-12-19 | Disposition: A | Discharge status: Home / Self Care | Payer: BC Managed Care – PPO | Source: Ambulatory Visit | Attending: Obstetrics & Gynecology | Admitting: Obstetrics & Gynecology

## 2010-12-19 DIAGNOSIS — Z1231 Encounter for screening mammogram for malignant neoplasm of breast: Secondary | ICD-10-CM

## 2010-12-25 ENCOUNTER — Ambulatory Visit
Admission: RE | Admit: 2010-12-25 | Discharge: 2010-12-25 | Disposition: A | Discharge status: Home / Self Care | Payer: BC Managed Care – PPO | Source: Ambulatory Visit | Attending: Obstetrics & Gynecology | Admitting: Obstetrics & Gynecology

## 2010-12-25 ENCOUNTER — Other Ambulatory Visit: Payer: Self-pay | Admitting: Obstetrics & Gynecology

## 2010-12-25 DIAGNOSIS — Z1231 Encounter for screening mammogram for malignant neoplasm of breast: Secondary | ICD-10-CM

## 2011-01-29 ENCOUNTER — Other Ambulatory Visit: Payer: Self-pay | Admitting: Internal Medicine

## 2011-01-30 ENCOUNTER — Other Ambulatory Visit: Payer: Self-pay | Admitting: Internal Medicine

## 2011-01-30 ENCOUNTER — Telehealth: Payer: Self-pay | Admitting: *Deleted

## 2011-01-30 MED ORDER — AZITHROMYCIN 250 MG PO TABS: ORAL_TABLET | ORAL | Status: DC

## 2011-01-30 NOTE — Telephone Encounter (Signed)
Patient informed. 

## 2011-01-30 NOTE — Telephone Encounter (Signed)
Ok Zpac 5d Thank you!

## 2011-01-30 NOTE — Telephone Encounter (Signed)
Patient requesting rx from [facility]- C/o sorethroat & earache.

## 2011-02-20 ENCOUNTER — Other Ambulatory Visit: Payer: Self-pay | Admitting: Internal Medicine

## 2011-02-20 ENCOUNTER — Other Ambulatory Visit: Payer: Self-pay

## 2011-02-20 DIAGNOSIS — Z0000 Encounter for general adult medical examination without abnormal findings: Secondary | ICD-10-CM

## 2011-02-20 DIAGNOSIS — R209 Unspecified disturbances of skin sensation: Secondary | ICD-10-CM

## 2011-02-24 ENCOUNTER — Other Ambulatory Visit: Payer: Self-pay | Admitting: Internal Medicine

## 2011-02-24 ENCOUNTER — Telehealth: Payer: Self-pay | Admitting: *Deleted

## 2011-02-24 NOTE — Telephone Encounter (Signed)
Patient requesting RF of pain meds

## 2011-02-25 NOTE — Telephone Encounter (Signed)
OK to fill this prescription with additional refills x0 Thank you!  

## 2011-02-26 MED ORDER — OXYCODONE HCL 15 MG PO TABS: 15.0000 mg | ORAL_TABLET | ORAL | Status: DC | PRN

## 2011-02-26 NOTE — Telephone Encounter (Signed)
Left mess for patient to call back. Will have Dr. Yetta Barre sign Rx in Dr. Loren Racer absence. I left pt a detailed mess informing her of this and that rx will be upfront for her to p/u.

## 2011-02-27 ENCOUNTER — Encounter: Payer: Self-pay | Admitting: Internal Medicine

## 2011-03-03 ENCOUNTER — Other Ambulatory Visit (INDEPENDENT_AMBULATORY_CARE_PROVIDER_SITE_OTHER): Payer: BC Managed Care – PPO

## 2011-03-03 ENCOUNTER — Encounter: Payer: Self-pay | Admitting: Internal Medicine

## 2011-03-03 ENCOUNTER — Other Ambulatory Visit (INDEPENDENT_AMBULATORY_CARE_PROVIDER_SITE_OTHER): Payer: BC Managed Care – PPO | Admitting: Internal Medicine

## 2011-03-03 DIAGNOSIS — Z13220 Encounter for screening for lipoid disorders: Secondary | ICD-10-CM

## 2011-03-03 DIAGNOSIS — R209 Unspecified disturbances of skin sensation: Secondary | ICD-10-CM

## 2011-03-03 DIAGNOSIS — Z0000 Encounter for general adult medical examination without abnormal findings: Secondary | ICD-10-CM

## 2011-03-03 LAB — URINALYSIS
Hgb urine dipstick: NEGATIVE
Leukocytes, UA: NEGATIVE
Nitrite: NEGATIVE
Specific Gravity, Urine: 1.01 (ref 1.000–1.030)
Urine Glucose: NEGATIVE
Urobilinogen, UA: 0.2 (ref 0.0–1.0)

## 2011-03-03 LAB — BASIC METABOLIC PANEL
BUN: 13 mg/dL (ref 6–23)
CO2: 30 mEq/L (ref 19–32)
GFR: 80.37 mL/min (ref >60.00)
Glucose, Bld: 76 mg/dL (ref 70–99)
Potassium: 3.9 mEq/L (ref 3.5–5.1)

## 2011-03-03 LAB — LIPID PANEL
HDL: 91.1 mg/dL (ref >39.00)
VLDL: 20.8 mg/dL (ref 0.0–40.0)

## 2011-03-03 LAB — CBC WITH DIFFERENTIAL/PLATELET
Basophils Relative: 0.6 % (ref 0.0–3.0)
Eosinophils Absolute: 0.1 10*3/uL (ref 0.0–0.7)
Eosinophils Relative: 2.6 % (ref 0.0–5.0)
Hemoglobin: 12 g/dL (ref 12.0–15.0)
MCHC: 34.5 g/dL (ref 30.0–36.0)
MCV: 92.3 fl (ref 78.0–100.0)
Monocytes Absolute: 0.2 10*3/uL (ref 0.1–1.0)
Neutro Abs: 2.6 10*3/uL (ref 1.4–7.7)
RBC: 3.78 Mil/uL — ABNORMAL LOW (ref 3.87–5.11)
WBC: 5.3 10*3/uL (ref 4.5–10.5)

## 2011-03-03 LAB — HEPATIC FUNCTION PANEL
Alkaline Phosphatase: 209 U/L — ABNORMAL HIGH (ref 39–117)
Bilirubin, Direct: 0.1 mg/dL (ref 0.0–0.3)
Total Bilirubin: 0.7 mg/dL (ref 0.3–1.2)

## 2011-03-03 LAB — LDL CHOLESTEROL, DIRECT: Direct LDL: 144.8 mg/dL

## 2011-03-04 ENCOUNTER — Ambulatory Visit (INDEPENDENT_AMBULATORY_CARE_PROVIDER_SITE_OTHER): Payer: BC Managed Care – PPO | Admitting: Internal Medicine

## 2011-03-04 ENCOUNTER — Encounter: Payer: Self-pay | Admitting: Internal Medicine

## 2011-03-04 ENCOUNTER — Telehealth: Payer: Self-pay | Admitting: Gastroenterology

## 2011-03-04 DIAGNOSIS — F429 Obsessive-compulsive disorder, unspecified: Secondary | ICD-10-CM

## 2011-03-04 DIAGNOSIS — G43919 Migraine, unspecified, intractable, without status migrainosus: Secondary | ICD-10-CM

## 2011-03-04 DIAGNOSIS — M25569 Pain in unspecified knee: Secondary | ICD-10-CM

## 2011-03-04 DIAGNOSIS — R748 Abnormal levels of other serum enzymes: Secondary | ICD-10-CM

## 2011-03-04 DIAGNOSIS — R7402 Elevation of levels of lactic acid dehydrogenase (LDH): Secondary | ICD-10-CM

## 2011-03-04 DIAGNOSIS — R7401 Elevation of levels of liver transaminase levels: Secondary | ICD-10-CM

## 2011-03-04 DIAGNOSIS — M25561 Pain in right knee: Secondary | ICD-10-CM | POA: Insufficient documentation

## 2011-03-04 HISTORY — DX: Abnormal levels of other serum enzymes: R74.8

## 2011-03-04 HISTORY — DX: Pain in unspecified knee: M25.569

## 2011-03-04 MED ORDER — OXYCODONE HCL 15 MG PO TABS: 15.0000 mg | ORAL_TABLET | Freq: Four times a day (QID) | ORAL | Status: DC | PRN

## 2011-03-04 NOTE — Assessment & Plan Note (Signed)
Better  

## 2011-03-04 NOTE — Telephone Encounter (Signed)
Pt scheduled to see Dr. Arlyce Dice 03/18/11@3 :15pm. Debra at Dr. Loren Racer office will notify the pt of appt date and time.

## 2011-03-04 NOTE — Progress Notes (Signed)
  Subjective:    Patient ID: Beth Huffman, female    DOB: 1961/09/17, 50 y.o.   MRN: [id number]  HPI   The patient is here to follow up on chronic depression, anxiety, headaches and chronic moderate back pain, fibromyalgia symptoms controlled with medicines, diet and exercise. C/o R knee pain x 12 mo or so  Review of Systems  Constitutional: Negative for chills.  HENT: Negative for congestion.   Eyes: Negative for pain.  Respiratory: Negative for cough.   Genitourinary: Negative for dysuria and decreased urine volume.  Musculoskeletal: Negative for back pain.  Neurological: Negative for weakness and light-headedness.  Psychiatric/Behavioral: Negative for confusion.       Objective:   Physical Exam  Constitutional: She appears well-developed and well-nourished. No distress.  HENT:  Head: Normocephalic.  Right Ear: External ear normal.  Left Ear: External ear normal.  Nose: Nose normal.  Mouth/Throat: Oropharynx is clear and moist.  Eyes: Conjunctivae are normal. Pupils are equal, round, and reactive to light. Right eye exhibits no discharge. Left eye exhibits no discharge.  Neck: Normal range of motion. Neck supple. No JVD present. No tracheal deviation present. No thyromegaly present.  Cardiovascular: Normal rate, regular rhythm and normal heart sounds.   Pulmonary/Chest: No stridor. No respiratory distress. She has no wheezes.  Abdominal: Soft. Bowel sounds are normal. She exhibits no distension and no mass. There is no tenderness. There is no rebound and no guarding.  Musculoskeletal: She exhibits tenderness (below  R knee). She exhibits no edema.  Lymphadenopathy:    She has no cervical adenopathy.  Neurological: She displays normal reflexes. No cranial nerve deficit. She exhibits normal muscle tone. Coordination normal.  Skin: No rash noted. No erythema.  Psychiatric: She has a normal mood and affect. Her behavior is normal. Judgment and thought content normal.         Assessment & Plan:   MIGRAINE UNSP W/INTRACTBL W/O STATUS MIGRAINOSUS Neurol appt is ending  OBSESSIVE-COMPULSIVE DISORDER Better   LBP  Meds refilled  Elevated Liver Enzymes of ? Etiology  GI consult

## 2011-03-04 NOTE — Assessment & Plan Note (Signed)
Neurol appt is ending

## 2011-03-10 ENCOUNTER — Ambulatory Visit: Payer: BC Managed Care – PPO | Admitting: Internal Medicine

## 2011-03-11 NOTE — Assessment & Plan Note (Signed)
Beth Huffman is a pleasant 50 year old married woman who is a patient of  Dr. Macarthur Critchley Huffman.  She has been referred for chronic pain  complaints in the neck, mid back, and lower extremities.   She is known to have history of a posterior fossa arachnoid cyst, which  was operated on by Dr. Karle Barr in June 2005.  She is also known  to have a cervical and thoracic syrinx per MRI, June 2009.   Her average pain is between a 7-8 on a scale of 10, interfering  significantly with activity.   She also has intermittent headaches if she does any heavy lifting.   Pain is typically worse with bending and lifting, improves with rest and  heat.  She reports good relief with current medications.   MEDICATIONS:  1. Ibuprofen 600 mg on a p.r.n. basis, Dr. Posey Rea.  2. Oxycodone 10/325 one-half tablet up to 3 times a day, Dr.      Posey Rea.  3. Topamax was increased last month from 50 mg twice a day to 50 mg      t.i.d., and she reports overall improved relief of pain with this      slight increase in her dosage.   FUNCTIONAL STATUS:  She is able to walk at least 20 minutes at a time.  She can climb stairs and drive.  She works 20-25 hours a week.  She is  independent with self-care.   REVIEW OF SYSTEMS:  Positive for numbness, tingling, dizziness, and  constipation.   Past medical, social, and family history are otherwise unchanged from  last visit.   PHYSICAL EXAMINATION:  VITAL SIGNS:  Blood pressure is 132/80, pulse 69,  respirations 18, and 98% saturated on room air.  GENERAL:  She is well-developed and well-nourished female who does not  appear in any distress.  She is oriented x3.  Speech is clear.  Affect  is bright.  She is alert, cooperative, and pleasant.  Follows commands  without difficulty and answers questions appropriately.   Cranial nerves are grossly intact as well as coordination.  Reflexes are  2+ in the upper and lower extremities without abnormal tone or  clonus  today.  No tremors are appreciated.   Straight leg raise is negative.   Sensation is intact in the upper and lower extremities with the  exception of left C6 dermatome.   She has minimal limitations in cervical range of motion.  Full shoulder  range of motion is noted, and full lumbar range of motion is noted.   Her gait is nonantalgic.  She has a normal base of support, normal heel-  toe mechanics, and normal straight length.  It is not wide based.  Tandem gait and Romberg tests are all performed adequately.   IMPRESSION:  1. Chronic left-sided neck pain.  2. Intermittent headaches.  3. Thoracic pain.  4. Bilateral foot pain.  5. Status post surgery of posterior fossa arachnoid cyst, Dr. Karle Barr, June 2005.  6. History of cervical and thoracic syrinx per MRI, April 18, 2008, was      compared with study from December 09, 2005, showing minimal      increase in diameter in the thoracic as well as cervical syrinx.   PLAN:  On discussion, Dr. Jeral Fruit felt that it was okay for her to do  some walking daily, also limitation in any kind of lifting, as this  certainly does aggravate her headache and may impact  her syrinx diameter  long term.  She understands she should not be doing any kind of Valsalva  maneuvers routinely and to make sure that her constipation is adequately  taken care of.  She is using MiraLax stool softeners and Dulcolax on a  p.r.n. basis.   She was set up for TENS unit last month.  She had some difficulties at  work and was unable to keep the appointment to do a TENS unit trial.   Also further discussion with Dr. Jeral Fruit suggesting that she follow up  with syrinx specialist in [facility] and that she should make appointment  to discuss this further with Dr. Jeral Fruit.  She states that she will  comply with this recommendation.   Overall, she states that the Topamax has been helpful, and she does not  need a refill on this medication today.   For  now, recommend until she follows up with specialist, hold off on  doing any type of activity on the elliptical, and I recommend that she  just do some daily walking on even surfaces.  We will see her back in  January 2010.           ______________________________  Brantley Stage, M.D.     DMK/MedQ  D:  10/06/2008 13:12:05  T:  10/07/2008 01:38:05  Job #:  [id number]   cc:   Beth Quint. Plotnikov, MD  [address]  [place]  [place] [address]   Beth Huffman, M.D.  Fax: [phone]

## 2011-03-11 NOTE — Assessment & Plan Note (Signed)
Beth Huffman is a 50 year old married woman who is the patient of Dr.  Macarthur Critchley Plotnikov.  She was last seen by me on October 06, 2008.  She  is known to have a history of a posterior fossa arachnoid cyst, which  was operated on by Beth Huffman in June 2005.  She is often known to  have a cervical and thoracic syrinx per MRI in June 2009, which appeared  to be slightly increasing from the previous scan.  She is being followed  in our [facility] for chronic thoracic pain and  predominantly right lower extremity pain.  She states in the interim,  she has had a fall at work.  She turned in the bathroom and lost her  balance and went down.  She has had some increased pain in the back and  leg since that time.  She did not want to undergo an MRI because of cost  right now.  She was recommended to assess with Dr. Jeral Fruit in [place].  However, she has not complied with this request, and apparently the  physician Dr. Jeral Fruit had recommended was currently no longer at that  particular facility.   Her average pain is about 8 on a scale of 10.  Currently, located in  thoracic, pseudolumbar region.  Pain is probably located in the lower  extremities, below the knees as well.  She does admit some balance  problems.  Average pain is about 8 on a scale of 10,  currently in  clinic is about 6, described as burning, tingling, and aching.  Pain is  worse since the morning.  It is worse again at end of the day.  Sleep is  fair.  Pain is worse with standing and prolonged sitting.  It improves  with rest, heat, and medication.  She gets fair relief with current  meds.   MEDICATIONS:  Provided through this clinic currently includes,  1. Topamax 50 mg 3 times a day.  2. Oxycodone 10/325 up to three times a day __________.   FUNCTIONAL STATUS:  She is able to walk at least 20 minutes at a time.  She can climb stairs and drive.  She works 20 hours a week.  She is  independent with  self-care.   REVIEW OF SYSTEMS:  Positive for numbness, tingling in lower  extremities, dizziness, unsteadiness.   No other changes in past medical, surgical, or family history.   PHYSICAL EXAMINATION:  VITAL SIGNS:  Blood pressure 116/76, pulse 65,  respirations 18, and 98% saturation on room air.  GENERAL:  She is well-developed, well-nourished woman who does not  appear in any distress.  She is oriented x3.  Speech is clear.  Affect  is bright.  She is alert, cooperative, and pleasant.  Follows commands  without difficulty.  NEUROLOGIC:  Cranial nerves are grossly intact.  Coordination is intact.  Reflexes are 2+ in the upper and lower extremities.  No clonus is  appreciated.  No abnormal tone is appreciated.  Her motor strength is  good in the upper as well as lower extremities without any obvious focal  deficits noted.  Minimal limitations in cervical range of motion.  Full  shoulder range of motion is noted.  Lumbar motion is fairly well  preserved without significant discomfort.   Transitioning from sitting to standing is done with ease.  Tandem gait  and Romberg tests are performed adequately.   IMPRESSION:  1. Chronic neck and back pain.  2.  Intermittent headaches.  3. Bilateral lower extremity pain.  4. History of posterior fossa arachnoid cyst.  5. History of cervical and thoracic syrinx per MRI.  Last study was      April 18, 2008, compared with study from December 09, 2005, showing      minimal increase in diameter of thoracic as well as cervical      syrinx.   PLAN:  With increased pain, the patient would like to follow up with Dr.  Jeral Fruit.  She is recommended to see a specialist in [place], however, Ms.  Kinkead has been unable to make this appointment, apparently the  specialist may not be at that particular facility.  She would like to  discuss this further with Dr. Jeral Fruit and find out what her options are  as far as monitoring the syrinx anddiscussing  management options.   Currently, we will continue her on Topamax 50 mg 1 p.o. t.i.d. as well  as oxycodone 10/325 three times a day and we will add nortriptyline 10  mg 1 p.o. q.p.m. for 10 days then increasing to 2 tablets p.o. q.p.m.  We will give her a referral to go back to see Dr. Jeral Fruit with a copy of  my notes as well.  At this point, she is not interested to undergo  further imaging studies due to cost.           ______________________________  Brantley Stage, M.D.     DMK/MedQ  D:  11/17/2008 14:09:10  T:  11/18/2008 06:19:03  Job #:  [id number]

## 2011-03-11 NOTE — Group Therapy Note (Signed)
[name] is a 50 year old married woman who is kindly  referred by Dr. Posey Rea.   Beth Huffman presents with left-sided cervicalgia, thoracic burning pain,  and bilateral dull-aching feet.   She has a history significant for a posterior fossa arachnoid cysts  which she underwent surgery for by Karle Barr back in 2005.  Dr.  Greggory Stallion has since moved out of state and she has been followed by another  neurosurgeon who is Dr. Velna Ochs.   She is known to have a cervical and thoracic syrinx.  Last MRI was on  April 18, 2008, of the cervical, thoracic, and lumbar spine.  Her syrinx  in her thoracic cord at the C6 level was noted to be minimally increased  in diameter, measuring 2-3 mm.  There is a syrinx in the thoracic cord  which extends approximately from T3 to the conus which appeared to have  slightly increased since the prior study of February 2007.   Lumbar radiographs were notable for degenerative changes mild in extent  at the L3-L4 level.   She states she has had a cyst since 1993.  Over the last several years,  she has had chronic pain in her mid thoracic region and dull aching in  both feet.   She states her average pain is about 7-8 on a scale of 10, interfering  significantly with activity.  Pain is typically worse in the morning.  Sleep is fair.  Pain is exacerbated by prolonged standing, improves with  rest and medication.  She gets fair relief with current meds.   MEDICATIONS:  Which she takes for pain relief from Dr. Posey Rea  including the following;  1. Ibuprofen 600 mg 3 times a day.  2. Oxycodone 10/325 half a tablet 3-4 times a day.  3. Protonix 40 mg daily.  4. Topamax 50 mg twice a day.  5. Maxalt 10 mg on a p.r.n. basis.  6. She also takes p.r.n. Phenergan, MiraLax, and Dulcolax.   She states the pain in her left cervical region is about 7-8 on a scale  of 10.  This occurs typically when she has been sitting for a while.  The mid thoracic  pain is about 3 on a scale of 10, not as significant as  the neck pain.  She describes it is rather constant and burning.  Her  feet are about 5 on a scale of 10 with respect to pain, and she  described it is dull, aching, and cool feeling.   ALLERGIES:  She reports allergies and intolerances to the following  medications, CODEINE, VICODIN, ULTRAM, LYRICA, CEFTIN, MACROBID, as well  as FENTANYL.   FUNCTIONAL STATUS:  She is able to walk about 20-30 minutes.  She is  able to climb stairs.  She does drive.  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  She is independent with self-care, overall high  functioning individual.   Reports occasional problems controlling bowel or bladder, numbness,  tingling in lower extremities, problems with walking in the morning,  dizziness occasionally, and constipation which is managed with the above  medication.   PAST MEDICAL HISTORY:  Otherwise, noncontributory.  She is an overall  healthy woman.   SOCIAL HISTORY:  Remarkable for married, denies alcohol or smoking.   FAMILY HISTORY:  Remarkable for heart disease, diabetes, and high blood  pressure.   PHYSICAL EXAMINATION:  VITAL SIGNS:  Blood pressure is 120/78, pulse 70,  respirations 18, and 98% saturated on  room air.  GENERAL:  She is a mildly obese, well-developed, well-nourished woman  who does not appear in any distress.  NEUROLOGIC:  She is oriented x3.  Speech is clear.  Affect is bright.  She is alert, cooperative, and pleasant, and she follows commands  without any difficulty.   Cranial nerves are grossly intact.  Coordination is intact.  EXTREMITIES:  Her reflexes are 2+ at biceps, triceps, brachioradialis,  2+ at the patellar and Achilles tendons as well.  I do not appreciate  any abnormal tone or clonus.  Toes are equivocal.   Sensory exam is intact to light touch as well as pinprick and vibratory  sensation and position sense.   Gait is normal with normal base of  support.  Tandem gait and Romberg  tests are performed adequately.   Cervical range of motion is mildly limited with rotation to the right.  She has full shoulder range of motion.  Lumbar range of motion is  minimally limited as well.   Manual muscle testing reveals 5/5 strength at deltoid, biceps, triceps,  brachioradialis, finger flexors, and intrinsic, 5/5 at hip flexors, knee  extensors, dorsiflexors, plantar flexors, as well as EHL.   IMPRESSION:  1. Chronic left-sided neck pain.  2. Thoracic pain.  3. Bilateral foot pain.  4. Status post surgery of posterior fossa arachnoid cysts, Dr. Karle Barr in June 2005.  5. History of cervical and thoracic syrinx with MRI from April 18, 2008, compared with study from December 09, 2005, showing minimal      increase in diameter in the thoracic as well as cervical syrinx.  6. Headache.  7. A 25-pound weight gain over 4 years.   PLAN:  1. Phone call to Dr. Jeral Fruit in [place] office.  2. We will trial TENS unit for pain management.  3. Adjust slightly increasing Topamax to one more time a day.  We will      also trial Lidoderm with her.  She is interested in improving her      overall function.  We will discuss further with neurosurgeon      regarding any contraindications to walking and possibly using      elliptical trainer.   We would also like to review cervical MRI to review involvement of  facets or disk as there was no note per radiologist reading these films.  We will see her back in a month.           ______________________________  Brantley Stage, M.D.     DMK/MedQ  D:  09/08/2008 14:40:45  T:  09/09/2008 03:03:46  Job #:  [id number]   cc:   Georgina Quint. Plotnikov, MD  [address]  [place]  [place] [address]

## 2011-03-18 ENCOUNTER — Ambulatory Visit: Payer: BC Managed Care – PPO | Admitting: Gastroenterology

## 2011-03-18 ENCOUNTER — Other Ambulatory Visit: Payer: Self-pay | Admitting: *Deleted

## 2011-03-18 MED ORDER — RIZATRIPTAN BENZOATE 10 MG PO TBDP: 10.0000 mg | ORAL_TABLET | Freq: Every day | ORAL | Status: DC | PRN

## 2011-03-19 ENCOUNTER — Encounter: Payer: Self-pay | Admitting: Gastroenterology

## 2011-03-21 ENCOUNTER — Telehealth: Payer: Self-pay

## 2011-03-21 NOTE — Telephone Encounter (Signed)
Patient called Beth Huffman requesting rx for oxycodone 15 mg and will be out on Monday 03/24/11. Per pt, she usually gets 3 monthly scripts for 1/2 or 1 by mouth qid as needed pain #120. Please advise if ok to refill. Thanks

## 2011-03-21 NOTE — Telephone Encounter (Signed)
OK. Thx

## 2011-03-21 NOTE — Telephone Encounter (Signed)
Beth Huffman - please help, Why did last rx given for med at OV have fill on or after 04/2011?

## 2011-03-25 MED ORDER — OXYCODONE HCL 15 MG PO TABS: 15.0000 mg | ORAL_TABLET | Freq: Four times a day (QID) | ORAL | Status: DC | PRN

## 2011-03-25 NOTE — Telephone Encounter (Signed)
Ok thank you 

## 2011-03-25 NOTE — Telephone Encounter (Signed)
Spoke w/pharm - pt filled rx for oxy 15 mg # 120 on 4/4, then # 30 (our error, should have been 120) on 5/7. Now due for rf. MD gave pt 2 rx's at last ov fill 6/6 and 7/6 b/c pt thought she was due then but she is due to fill now. Gave verbal Ok for [facility] to fill 6/6 rx today 5/28. Pt will bring back rx for 7/6 and we will shred and give new rx for fill on 6/28

## 2011-04-01 ENCOUNTER — Telehealth: Payer: Self-pay | Admitting: *Deleted

## 2011-04-01 MED ORDER — AZITHROMYCIN 250 MG PO TABS: ORAL_TABLET | ORAL | Status: DC

## 2011-04-01 NOTE — Telephone Encounter (Signed)
Ok Zpac Thx 

## 2011-04-01 NOTE — Telephone Encounter (Signed)
Patient requesting RX for zpak. She is c/o sore throat and worried she has sinus infection.

## 2011-04-01 NOTE — Telephone Encounter (Signed)
Pt left another mess also c/o chills and "puss" on her throat. Req rx for zpak.

## 2011-04-01 NOTE — Telephone Encounter (Signed)
Left vm for pt on personal cell w/mess to check w/pharm, rx sent in

## 2011-04-02 ENCOUNTER — Encounter: Payer: Self-pay | Admitting: Internal Medicine

## 2011-04-02 ENCOUNTER — Ambulatory Visit (INDEPENDENT_AMBULATORY_CARE_PROVIDER_SITE_OTHER): Payer: BC Managed Care – PPO | Admitting: Internal Medicine

## 2011-04-02 DIAGNOSIS — J029 Acute pharyngitis, unspecified: Secondary | ICD-10-CM

## 2011-04-02 DIAGNOSIS — J020 Streptococcal pharyngitis: Secondary | ICD-10-CM

## 2011-04-02 MED ORDER — CEFTRIAXONE SODIUM 1 G IJ SOLR
1.0000 g | Freq: Once | INTRAMUSCULAR | Status: AC
  Administered 2011-04-02: 1 g via INTRAMUSCULAR

## 2011-04-02 MED ORDER — PENICILLIN V POTASSIUM 500 MG PO TABS: 500.0000 mg | ORAL_TABLET | Freq: Three times a day (TID) | ORAL | Status: AC

## 2011-04-02 NOTE — Progress Notes (Signed)
  Subjective:     Beth Huffman is a 50 y.o. female who presents for evaluation of sore throat. Associated symptoms include nasal blockage, post nasal drip, sinus and nasal congestion, sore throat and white spots in throat. Onset of symptoms was 2 days ago, and have been gradually worsening since that time. She is drinking moderate amounts of fluids. She has had a recent close exposure to someone with proven streptococcal pharyngitis.  The following portions of the patient's history were reviewed and updated as appropriate: allergies, current medications, past family history, past medical history, past social history, past surgical history and problem list.  Review of Systems Pertinent items are noted in HPI.    Objective:    BP 120/88  Pulse 98  Temp(Src) 98.9 F (37.2 C) (Oral)  Ht 5\' 5"  (1.651 m)  SpO2 97% General appearance: alert, cooperative and no distress Head: Normocephalic, without obvious abnormality, atraumatic Eyes: negative Ears: normal TM's and external ear canals both ears Throat: abnormal findings: exudates present and mild oropharyngeal erythema Neck: mild anterior cervical adenopathy, no carotid bruit, no JVD, supple, symmetrical, trachea midline and thyroid not enlarged, symmetric, no tenderness/mass/nodules Lungs: clear to auscultation bilaterally Heart: regular rate and rhythm, S1, S2 normal, no murmur, click, rub or gallop  Laboratory Strep test not done. Results:N/A.    Assessment:    Acute pharyngitis, likely  Strep throat.    Plan:    Patient placed on antibiotics. Use of OTC analgesics recommended as well as salt water gargles. Patient advised that he will be infectious for 24 hours after starting antibiotics. Follow up as needed.    IM rocephin 1g today

## 2011-04-02 NOTE — Patient Instructions (Signed)
It was good to see you today. Shot of antibiotics (rocephin) given to you today for probable strep throat Also penicillin tablets - Your prescription(s) have been submitted to your pharmacy. Please take as directed and contact our office if you believe you are having problem(s) with the medication(s). Out of work today as discussed (or until fever resolves x 24 hours) Salt Water Gargle This solution will help make your mouth and throat feel better. HOME CARE INSTRUCTIONS  Mix 1 teaspoon (4.9cc) of salt in 8 ounces (236 ml) of warm water.   Gargle with this solution as much or often as you need or as directed. Swish and gargle gently if you have any sores or wounds in your mouth.   Do not swallow this mixture.  Document Released: 07/17/2004 Document Re-Released: 01/07/2010 [facility] Patient Information 2011 [place], [place].  Please schedule followup as needed - call if problems.

## 2011-04-21 ENCOUNTER — Other Ambulatory Visit: Payer: Self-pay | Admitting: *Deleted

## 2011-04-21 ENCOUNTER — Telehealth: Payer: Self-pay | Admitting: *Deleted

## 2011-04-21 MED ORDER — OXYCODONE HCL 15 MG PO TABS: 15.0000 mg | ORAL_TABLET | Freq: Four times a day (QID) | ORAL | Status: DC | PRN

## 2011-04-21 NOTE — Telephone Encounter (Signed)
Ok to fill early per Dr. Posey Rea. Pharmacy informed.

## 2011-04-21 NOTE — Telephone Encounter (Signed)
Pt states she damaged her oxycodone. She is due to fill again 6/29 but only has 1/2 of one now. Says pills were in makeup bag and fingernail polish spilled on them. She only has 1/2 pill left to last until Friday. Ok for early RF of the RX she has on hand?

## 2011-04-21 NOTE — Progress Notes (Signed)
See prev phone note. Gave verbal ok to Rf early per Dr. Posey Rea

## 2011-04-28 ENCOUNTER — Other Ambulatory Visit: Payer: Self-pay | Admitting: Internal Medicine

## 2011-05-20 ENCOUNTER — Telehealth: Payer: Self-pay | Admitting: *Deleted

## 2011-05-20 MED ORDER — OXYCODONE HCL 15 MG PO TABS: 15.0000 mg | ORAL_TABLET | Freq: Four times a day (QID) | ORAL | Status: DC | PRN

## 2011-05-20 NOTE — Telephone Encounter (Signed)
OK to fill this prescription with additional refills x0 Thank you!  

## 2011-05-20 NOTE — Telephone Encounter (Signed)
Pending signature

## 2011-05-20 NOTE — Telephone Encounter (Signed)
Patient requesting RF of oxycodone. (Due 7/25)

## 2011-05-21 NOTE — Telephone Encounter (Signed)
Rx ready, Left vm for pt that req was ready.

## 2011-06-03 ENCOUNTER — Ambulatory Visit: Payer: BC Managed Care – PPO | Admitting: Internal Medicine

## 2011-06-17 ENCOUNTER — Telehealth: Payer: Self-pay | Admitting: *Deleted

## 2011-06-17 NOTE — Telephone Encounter (Signed)
OK to fill this prescription with additional refills x0 Thank you!  

## 2011-06-17 NOTE — Telephone Encounter (Signed)
Patient requesting RF of oxycodone  

## 2011-06-18 MED ORDER — OXYCODONE HCL 15 MG PO TABS: 15.0000 mg | ORAL_TABLET | Freq: Four times a day (QID) | ORAL | Status: DC | PRN

## 2011-06-18 NOTE — Telephone Encounter (Signed)
Pending signature

## 2011-06-18 NOTE — Telephone Encounter (Signed)
Ready for pick up, left vm for patient.

## 2011-07-11 ENCOUNTER — Encounter: Payer: Self-pay | Admitting: Gastroenterology

## 2011-07-22 ENCOUNTER — Telehealth: Payer: Self-pay | Admitting: *Deleted

## 2011-07-22 MED ORDER — OXYCODONE HCL 15 MG PO TABS: 15.0000 mg | ORAL_TABLET | Freq: Four times a day (QID) | ORAL | Status: DC | PRN

## 2011-07-22 NOTE — Telephone Encounter (Signed)
OK to fill this prescription with additional refills x0 Thank you!  

## 2011-07-22 NOTE — Telephone Encounter (Signed)
Pending signature

## 2011-07-22 NOTE — Telephone Encounter (Signed)
Patient requesting RF of oxycodone  

## 2011-07-23 NOTE — Telephone Encounter (Signed)
Left pt VM that RX would be ready for pick up

## 2011-07-29 ENCOUNTER — Encounter: Payer: Self-pay | Admitting: Internal Medicine

## 2011-07-29 ENCOUNTER — Ambulatory Visit (INDEPENDENT_AMBULATORY_CARE_PROVIDER_SITE_OTHER): Payer: BC Managed Care – PPO | Admitting: Internal Medicine

## 2011-07-29 VITALS — BP 118/76 | HR 76 | Temp 98.3°F | Resp 16

## 2011-07-29 DIAGNOSIS — M545 Low back pain, unspecified: Secondary | ICD-10-CM

## 2011-07-29 DIAGNOSIS — R635 Abnormal weight gain: Secondary | ICD-10-CM

## 2011-07-29 DIAGNOSIS — G43919 Migraine, unspecified, intractable, without status migrainosus: Secondary | ICD-10-CM

## 2011-07-29 DIAGNOSIS — G950 Syringomyelia and syringobulbia: Secondary | ICD-10-CM

## 2011-07-29 DIAGNOSIS — F429 Obsessive-compulsive disorder, unspecified: Secondary | ICD-10-CM

## 2011-07-29 MED ORDER — PANTOPRAZOLE SODIUM 40 MG PO TBEC: 40.0000 mg | DELAYED_RELEASE_TABLET | Freq: Every day | ORAL | Status: DC

## 2011-07-29 MED ORDER — NEOMYCIN-POLYMYXIN-HC 3.5-10000-1 OT SOLN: 3.0000 [drp] | Freq: Three times a day (TID) | OTIC | Status: AC

## 2011-07-29 MED ORDER — SUMATRIPTAN 5 MG/ACT NA SOLN: 1.0000 | NASAL | Status: DC | PRN

## 2011-07-29 MED ORDER — OXYCODONE HCL 15 MG PO TABS: 15.0000 mg | ORAL_TABLET | Freq: Four times a day (QID) | ORAL | Status: DC | PRN

## 2011-07-29 MED ORDER — TOPIRAMATE 100 MG PO TABS: 100.0000 mg | ORAL_TABLET | Freq: Two times a day (BID) | ORAL | Status: DC

## 2011-07-29 NOTE — Progress Notes (Signed)
  Subjective:    Patient ID: Beth Huffman, female    DOB: Feb 08, 1961, 50 y.o.   MRN: [id number]  HPI  The patient presents for a follow-up of  chronic OCD, chronic LBP, wt gain, anxiety, HAs controlled with medicines    Review of Systems  Constitutional: Positive for unexpected weight change (wt gain). Negative for chills, activity change, appetite change and fatigue.  HENT: Negative for congestion, mouth sores and sinus pressure.   Eyes: Negative for visual disturbance.  Respiratory: Negative for cough, chest tightness and shortness of breath.   Gastrointestinal: Negative for nausea and abdominal pain.  Genitourinary: Negative for frequency, difficulty urinating and vaginal pain.  Musculoskeletal: Positive for back pain. Negative for gait problem.  Skin: Negative for pallor and rash.  Neurological: Negative for dizziness, tremors, syncope, weakness, numbness and headaches.  Psychiatric/Behavioral: Negative for confusion and sleep disturbance. The patient is not nervous/anxious (better).        Objective:   Physical Exam  Constitutional: She appears well-developed. No distress.       Obese   HENT:  Head: Normocephalic.  Right Ear: External ear normal.  Left Ear: External ear normal.  Nose: Nose normal.  Mouth/Throat: Oropharynx is clear and moist.  Eyes: Conjunctivae are normal. Pupils are equal, round, and reactive to light. Right eye exhibits no discharge. Left eye exhibits no discharge.  Neck: Normal range of motion. Neck supple. No JVD present. No tracheal deviation present. No thyromegaly present.  Cardiovascular: Normal rate, regular rhythm and normal heart sounds.   Pulmonary/Chest: No stridor. No respiratory distress. She has no wheezes.  Abdominal: Soft. Bowel sounds are normal. She exhibits no distension and no mass. There is no tenderness. There is no rebound and no guarding.  Musculoskeletal: She exhibits no edema and no tenderness (LS spine).  Lymphadenopathy:      She has no cervical adenopathy.  Neurological: She displays normal reflexes. No cranial nerve deficit. She exhibits normal muscle tone. Coordination normal.  Skin: No rash noted. No erythema.  Psychiatric: She has a normal mood and affect. Her behavior is normal. Judgment and thought content normal.          Assessment & Plan:

## 2011-07-29 NOTE — Assessment & Plan Note (Signed)
Nutr cons Check labs

## 2011-07-29 NOTE — Assessment & Plan Note (Signed)
Continue with current prescription therapy as reflected on the Med list.  

## 2011-07-29 NOTE — Assessment & Plan Note (Signed)
Better on rx Continue with current prescription therapy as reflected on the Med list.

## 2011-07-29 NOTE — Assessment & Plan Note (Signed)
Increase Topamax to 100 mg bid

## 2011-08-14 ENCOUNTER — Emergency Department (HOSPITAL_COMMUNITY): Payer: BC Managed Care – PPO

## 2011-08-14 ENCOUNTER — Emergency Department (HOSPITAL_COMMUNITY)
Admission: EM | Admit: 2011-08-14 | Discharge: 2011-08-14 | Disposition: A | Discharge status: Home / Self Care | Payer: BC Managed Care – PPO | Attending: Emergency Medicine | Admitting: Emergency Medicine

## 2011-08-14 DIAGNOSIS — Z79899 Other long term (current) drug therapy: Secondary | ICD-10-CM | POA: Insufficient documentation

## 2011-08-14 DIAGNOSIS — R112 Nausea with vomiting, unspecified: Secondary | ICD-10-CM | POA: Insufficient documentation

## 2011-08-14 DIAGNOSIS — G43909 Migraine, unspecified, not intractable, without status migrainosus: Secondary | ICD-10-CM | POA: Insufficient documentation

## 2011-08-14 LAB — GLUCOSE, CAPILLARY: Glucose-Capillary: 109 mg/dL — ABNORMAL HIGH (ref 70–99)

## 2011-08-19 ENCOUNTER — Encounter: Payer: Self-pay | Admitting: Internal Medicine

## 2011-08-19 ENCOUNTER — Other Ambulatory Visit (INDEPENDENT_AMBULATORY_CARE_PROVIDER_SITE_OTHER): Payer: BC Managed Care – PPO

## 2011-08-19 ENCOUNTER — Ambulatory Visit (INDEPENDENT_AMBULATORY_CARE_PROVIDER_SITE_OTHER): Payer: BC Managed Care – PPO | Admitting: Internal Medicine

## 2011-08-19 VITALS — BP 104/70 | HR 72

## 2011-08-19 DIAGNOSIS — G930 Cerebral cysts: Secondary | ICD-10-CM

## 2011-08-19 DIAGNOSIS — G950 Syringomyelia and syringobulbia: Secondary | ICD-10-CM

## 2011-08-19 DIAGNOSIS — R739 Hyperglycemia, unspecified: Secondary | ICD-10-CM

## 2011-08-19 DIAGNOSIS — R7309 Other abnormal glucose: Secondary | ICD-10-CM

## 2011-08-19 DIAGNOSIS — R112 Nausea with vomiting, unspecified: Secondary | ICD-10-CM

## 2011-08-19 DIAGNOSIS — G43919 Migraine, unspecified, intractable, without status migrainosus: Secondary | ICD-10-CM

## 2011-08-19 HISTORY — DX: Cerebral cysts: G93.0

## 2011-08-19 HISTORY — DX: Nausea with vomiting, unspecified: R11.2

## 2011-08-19 HISTORY — DX: Hyperglycemia, unspecified: R73.9

## 2011-08-19 LAB — COMPREHENSIVE METABOLIC PANEL
ALT: 45 U/L — ABNORMAL HIGH (ref 0–35)
AST: 51 U/L — ABNORMAL HIGH (ref 0–37)
Albumin: 4.1 g/dL (ref 3.5–5.2)
Alkaline Phosphatase: 117 U/L (ref 39–117)
BUN: 17 mg/dL (ref 6–23)
Calcium: 9.2 mg/dL (ref 8.4–10.5)
Chloride: 108 mEq/L (ref 96–112)
Creatinine, Ser: 0.9 mg/dL (ref 0.4–1.2)
Potassium: 3.7 mEq/L (ref 3.5–5.1)

## 2011-08-19 LAB — CBC WITH DIFFERENTIAL/PLATELET
Basophils Absolute: 0 10*3/uL (ref 0.0–0.1)
Eosinophils Absolute: 0.2 10*3/uL (ref 0.0–0.7)
HCT: 37.3 % (ref 36.0–46.0)
Hemoglobin: 12.8 g/dL (ref 12.0–15.0)
Lymphocytes Relative: 39.5 % (ref 12.0–46.0)
Lymphs Abs: 2.2 10*3/uL (ref 0.7–4.0)
MCHC: 34.4 g/dL (ref 30.0–36.0)
Monocytes Relative: 5.2 % (ref 3.0–12.0)
Neutro Abs: 2.9 10*3/uL (ref 1.4–7.7)
Platelets: 209 10*3/uL (ref 150.0–400.0)
RDW: 13.1 % (ref 11.5–14.6)

## 2011-08-19 LAB — HEMOGLOBIN A1C: Hgb A1c MFr Bld: 5.3 % (ref 4.6–6.5)

## 2011-08-19 LAB — URINALYSIS, ROUTINE W REFLEX MICROSCOPIC
Leukocytes, UA: NEGATIVE
Nitrite: NEGATIVE
Specific Gravity, Urine: <=1.005 (ref 1.000–1.030)
Total Protein, Urine: NEGATIVE
pH: 6 (ref 5.0–8.0)

## 2011-08-19 LAB — SEDIMENTATION RATE: Sed Rate: 21 mm/hr (ref 0–22)

## 2011-08-19 LAB — LIPASE: Lipase: 34 U/L (ref 11.0–59.0)

## 2011-08-19 MED ORDER — METHYLPREDNISOLONE 4 MG PO KIT: PACK | ORAL | Status: AC

## 2011-08-19 NOTE — Patient Instructions (Signed)

## 2011-08-19 NOTE — Progress Notes (Signed)
Subjective:    Patient ID: Beth Huffman, female    DOB: 15-Jul-1961, 50 y.o.   MRN: [id number]  HPI New to me she complains that 5 days ago she developed a severe right-sided HA and went to the ER for an eval. She had a CT of the brain that showed the arachnoid cyst with no change from prior xrays. Her HA eventually resolved with some meds (imitrex and percocet) but now she has persistent N and V. She gets some relief with phenergan. She previously had a decompression of the cyst at [facility] but she describes it as unsuccessful in that she has persistent intermittent pain and is not a candidate for a VP shunt. She also has a syringomyelia in the T-spine (5,6,7) and she tells me that she is overdue for a f/up on an MRI of the T-spine and needs a f/up MRI of the brain as the CT scan does not go all the way down the brain stem.  Review of Systems  Constitutional: Positive for fatigue. Negative for fever, chills, diaphoresis, activity change, appetite change and unexpected weight change.  HENT: Negative for hearing loss, ear pain, facial swelling, trouble swallowing, neck pain, neck stiffness and tinnitus.   Eyes: Negative for photophobia, pain, discharge, redness, itching and visual disturbance.  Respiratory: Negative for apnea, cough, choking, chest tightness, shortness of breath and wheezing.   Cardiovascular: Negative for chest pain, palpitations and leg swelling.  Gastrointestinal: Positive for nausea, vomiting and constipation. Negative for abdominal pain, diarrhea, blood in stool, abdominal distention, anal bleeding and rectal pain.  Genitourinary: Negative.   Musculoskeletal: Negative for myalgias, back pain, joint swelling, arthralgias and gait problem.  Skin: Negative for color change, pallor, rash and wound.  Neurological: Positive for headaches. Negative for dizziness, tremors, seizures, syncope, facial asymmetry, speech difficulty, weakness, light-headedness and numbness.  Hematological:  Negative for adenopathy. Does not bruise/bleed easily.  Psychiatric/Behavioral: Negative.        Objective:   Physical Exam  Vitals reviewed. Constitutional: She is oriented to person, place, and time. She appears well-developed and well-nourished. No distress.  HENT:  Head: Normocephalic and atraumatic.  Mouth/Throat: Oropharynx is clear and moist. No oropharyngeal exudate.  Eyes: Conjunctivae and EOM are normal. Pupils are equal, round, and reactive to light. Right eye exhibits no discharge. Left eye exhibits no discharge. No scleral icterus.  Neck: Normal range of motion. Neck supple. No JVD present. No tracheal deviation present. No thyromegaly present.  Cardiovascular: Normal rate, regular rhythm, normal heart sounds and intact distal pulses.  Exam reveals no gallop and no friction rub.   No murmur heard. Pulmonary/Chest: Effort normal and breath sounds normal. No stridor. No respiratory distress. She has no wheezes. She has no rales. She exhibits no tenderness.  Abdominal: Soft. Bowel sounds are normal. She exhibits no distension and no mass. There is no tenderness. There is no rebound and no guarding.  Musculoskeletal: Normal range of motion. She exhibits no edema and no tenderness.  Lymphadenopathy:    She has no cervical adenopathy.  Neurological: She is alert and oriented to person, place, and time. She displays no atrophy, no tremor and normal reflexes. No cranial nerve deficit or sensory deficit. She exhibits normal muscle tone. She displays a negative Romberg sign. She displays no seizure activity. Coordination and gait normal. She displays no Babinski's sign on the right side. She displays no Babinski's sign on the left side.  Reflex Scores:      Tricep reflexes are 1+ on  the right side and 1+ on the left side.      Bicep reflexes are 1+ on the right side and 1+ on the left side.      Brachioradialis reflexes are 1+ on the right side and 1+ on the left side.      Patellar  reflexes are 1+ on the right side and 1+ on the left side.      Achilles reflexes are 1+ on the right side and 1+ on the left side. Skin: Skin is warm and dry. No rash noted. She is not diaphoretic. No erythema. No pallor.  Psychiatric: She has a normal mood and affect. Her behavior is normal. Judgment and thought content normal.      Lab Results  Component Value Date   WBC 5.3 03/03/2011   HGB 12.0 03/03/2011   HCT 34.9* 03/03/2011   PLT 176.0 03/03/2011   GLUCOSE 76 03/03/2011   CHOL 266* 03/03/2011   TRIG 104.0 03/03/2011   HDL 91.10 03/03/2011   LDLDIRECT 144.8 03/03/2011   ALT 75* 03/03/2011   AST 46* 03/03/2011   NA 140 03/03/2011   K 3.9 03/03/2011   CL 104 03/03/2011   CREATININE 0.8 03/03/2011   BUN 13 03/03/2011   CO2 30 03/03/2011   TSH 1.45 03/03/2011      Assessment & Plan:

## 2011-08-20 ENCOUNTER — Encounter: Payer: Self-pay | Admitting: Internal Medicine

## 2011-08-20 LAB — C-REACTIVE PROTEIN: CRP: 0.44 mg/dL (ref <0.60)

## 2011-08-21 ENCOUNTER — Telehealth: Payer: Self-pay | Admitting: *Deleted

## 2011-08-21 NOTE — Assessment & Plan Note (Signed)
I think she is having sequelae s/p a migraine headache and therefore I asked her to take a course of steroid to reduce the inflammation, I will check her labs today to look for organ pathology, dehydration, pancreatitis, etc

## 2011-08-21 NOTE — Assessment & Plan Note (Signed)
I will check her a1c

## 2011-08-21 NOTE — Assessment & Plan Note (Signed)
I have asked her to go ahead and get an updated MRI of the brain to make sure that there aren't any lesions such as mass, cva, etc that a CT would miss and also want to get a better view of the brainstem so I have ordered an MRI of the brain

## 2011-08-21 NOTE — Telephone Encounter (Signed)
Patient requesting results of labs from last OV. There was letter mailed, need to call patient to inform.

## 2011-08-21 NOTE — Assessment & Plan Note (Signed)
Will try a medrol dose pak to treat this

## 2011-08-21 NOTE — Assessment & Plan Note (Signed)
She is overdue for an MRI of the area to see if there are any complications or if this is causing any of her symptoms

## 2011-08-22 ENCOUNTER — Other Ambulatory Visit (HOSPITAL_COMMUNITY): Payer: BC Managed Care – PPO

## 2011-08-22 ENCOUNTER — Inpatient Hospital Stay (HOSPITAL_COMMUNITY): Admission: RE | Admit: 2011-08-22 | Payer: BC Managed Care – PPO | Source: Ambulatory Visit

## 2011-08-22 NOTE — Telephone Encounter (Signed)
Left detailed mess informing pt of below.  

## 2011-08-27 ENCOUNTER — Inpatient Hospital Stay (HOSPITAL_COMMUNITY): Admission: RE | Admit: 2011-08-27 | Payer: BC Managed Care – PPO | Source: Ambulatory Visit

## 2011-08-27 ENCOUNTER — Other Ambulatory Visit (HOSPITAL_COMMUNITY): Payer: BC Managed Care – PPO

## 2011-08-30 ENCOUNTER — Other Ambulatory Visit: Payer: Self-pay | Admitting: Internal Medicine

## 2011-09-01 ENCOUNTER — Telehealth: Payer: Self-pay | Admitting: *Deleted

## 2011-09-01 MED ORDER — RIZATRIPTAN BENZOATE 10 MG PO TBDP: 10.0000 mg | ORAL_TABLET | ORAL | Status: DC | PRN

## 2011-09-01 NOTE — Telephone Encounter (Signed)
OK to fill this prescription with additional refills x11 Thank you!  

## 2011-09-01 NOTE — Telephone Encounter (Signed)
Requested Medications     MAXALT-MLT 10 MG disintegrating tablet [Pharmacy Med Name: MAXALT MLT 10 MG TABLET]   TAKE 1 TABLET DAILY AS NEEDED. MAY REPEAT IN 2 HOURS IF NEEDED.   Disp: 12 each Start: 08/30/2011  Class: Normal   Last refill: 06/18/2011

## 2011-09-10 ENCOUNTER — Encounter: Payer: Self-pay | Admitting: *Deleted

## 2011-09-10 ENCOUNTER — Encounter: Payer: BC Managed Care – PPO | Attending: Internal Medicine | Admitting: *Deleted

## 2011-09-10 DIAGNOSIS — Z713 Dietary counseling and surveillance: Secondary | ICD-10-CM | POA: Insufficient documentation

## 2011-09-10 DIAGNOSIS — R635 Abnormal weight gain: Secondary | ICD-10-CM

## 2011-09-10 NOTE — Patient Instructions (Addendum)
Goals:  Add protein rich foods at all meals/snacks.  Follow "Plate Method" for portion control (see yellow card).  Choose more whole grains, lean protein, low-fat dairy, and fruits/non-starchy vegetables.   Continue 30-45 minutes of physical activity 3 or more days/week (as able).  Limit sugar-sweetened beverages and concentrated sweets.  Aim for 25-30 grams of fiber daily.   Limit meals away from home to decrease sodium.   Keep food record for 1 week and email to me.

## 2011-09-10 NOTE — Progress Notes (Signed)
  Medical Nutrition Therapy:  Appt start time: 1530 end time:  1630.  Assessment:  Abnormal weight gain.  Pt here for assessment of weight gain.  Reports UBW of 140 lbs until menopause began 2 years ago; gained 53 lbs in last 1-1.5 years. No change in diet or exercise level during this time.  Continues to exercise 2-3 times/week, though is in pain most of the time d/t sx from syringomyelia. Eats out often, though smaller portions; excessive sodium intake noted. Suffers from migraines and has increased stress level as a Barista. Is very frustrated with weight gain.  MEDICATIONS: See medication list; reconciled with patient.   DIETARY INTAKE:  Usual eating pattern includes 3 meals and 0-1 snacks per day.  24-hr recall:  B ( AM): 1 cup Cheerios w/ almond milk Snk ( AM): banana or nutrisystem pro drink   L ( PM): Turnip greens, green beans, pinto beans, small pc. pumpkin pie; [employer] or [employer] for 6"; diet coke or unsweet tea Snk ( PM): none D ( PM): Chicken club w/ L/T/Cheese ([employer]) Snk ( PM): none *4-5 bottles (70-85 fl oz) of water/day  Usual physical activity: Elliptical 2-3x/wk; 30-45 min/time  Estimated energy needs: 1200-1300 calories 150-165 g carbohydrates 90-95 g protein 35 g fat  Progress Towards Goal(s):  In progress.   Nutritional Diagnosis:  [place]-3.4 Unintentional weight gain related to menopause as evidenced by patient reported ~50 lb weight gain in last year with no changes to diet or activity level..    Intervention/Goals:  Add protein rich foods at all meals/snacks.  Follow "Plate Method" for portion control (see yellow card).  Choose more whole grains, lean protein, low-fat dairy, and fruits/non-starchy vegetables.   Continue 30-45 minutes of physical activity 3 or more days/week (as able).  Limit sugar-sweetened beverages and concentrated sweets.  Aim for 25-30 grams of fiber daily.   Limit meals away from home to decrease sodium.   Keep food record  for 1 week and email to me.   Monitoring/Evaluation:  Dietary intake, exercise, and body weight in 2 month(s).

## 2011-09-11 ENCOUNTER — Encounter: Payer: Self-pay | Admitting: *Deleted

## 2011-10-08 ENCOUNTER — Telehealth: Payer: Self-pay | Admitting: *Deleted

## 2011-10-08 MED ORDER — AZITHROMYCIN 250 MG PO TABS: ORAL_TABLET | ORAL | Status: AC

## 2011-10-08 NOTE — Telephone Encounter (Signed)
Pt states her husband [Phillip] was in to see you x3 wks ago for sinus infection and she is now experiencing the same symptoms and requesting a Z-pak Rx to her pharmacy.?

## 2011-10-08 NOTE — Telephone Encounter (Signed)
Ok zpac Done thx

## 2011-10-09 NOTE — Telephone Encounter (Signed)
LMOM to inform patient. 

## 2011-10-20 ENCOUNTER — Other Ambulatory Visit: Payer: Self-pay | Admitting: *Deleted

## 2011-10-20 NOTE — Telephone Encounter (Signed)
Pt called requesting refill for Oxycodone [due 12.26.12]. LMOM that Rx would not be available for refill until 12.26.12 [MD out of office].

## 2011-10-23 MED ORDER — OXYCODONE HCL 15 MG PO TABS: 15.0000 mg | ORAL_TABLET | Freq: Four times a day (QID) | ORAL | Status: DC | PRN

## 2011-10-23 NOTE — Telephone Encounter (Signed)
Rx printed-OK'd by Dr. Posey Rea. Pt picked up rx.

## 2011-10-29 ENCOUNTER — Ambulatory Visit (INDEPENDENT_AMBULATORY_CARE_PROVIDER_SITE_OTHER): Payer: BC Managed Care – PPO | Admitting: Internal Medicine

## 2011-10-29 ENCOUNTER — Encounter: Payer: Self-pay | Admitting: Internal Medicine

## 2011-10-29 DIAGNOSIS — F429 Obsessive-compulsive disorder, unspecified: Secondary | ICD-10-CM

## 2011-10-29 DIAGNOSIS — R110 Nausea: Secondary | ICD-10-CM

## 2011-10-29 DIAGNOSIS — R51 Headache: Secondary | ICD-10-CM

## 2011-10-29 DIAGNOSIS — G43919 Migraine, unspecified, intractable, without status migrainosus: Secondary | ICD-10-CM

## 2011-10-29 MED ORDER — OXYCODONE HCL 15 MG PO TABS: 15.0000 mg | ORAL_TABLET | Freq: Four times a day (QID) | ORAL | Status: DC | PRN

## 2011-10-29 MED ORDER — ELETRIPTAN HYDROBROMIDE 40 MG PO TABS: 40.0000 mg | ORAL_TABLET | ORAL | Status: DC | PRN

## 2011-10-29 MED ORDER — CYCLOBENZAPRINE HCL 5 MG PO TABS: 5.0000 mg | ORAL_TABLET | Freq: Two times a day (BID) | ORAL | Status: DC | PRN

## 2011-10-29 NOTE — Assessment & Plan Note (Signed)
Continue with current prescription therapy as reflected on the Med list.  

## 2011-10-29 NOTE — Progress Notes (Signed)
  Subjective:    Patient ID: Beth Huffman, female    DOB: 1961-04-18, 51 y.o.   MRN: [id number]  HPI   The patient is here to follow up on chronic LBP, migraines, anxiety, headaches and chronic moderate paresthesia symptoms controlled with medicines, diet and exercise.   Review of Systems  Constitutional: Negative for chills, activity change, appetite change, fatigue and unexpected weight change.  HENT: Negative for congestion, mouth sores and sinus pressure.   Eyes: Negative for visual disturbance.  Respiratory: Negative for cough and chest tightness.   Gastrointestinal: Negative for nausea and abdominal pain.  Genitourinary: Negative for frequency, difficulty urinating and vaginal pain.  Musculoskeletal: Positive for back pain. Negative for gait problem.  Skin: Negative for pallor and rash.  Neurological: Positive for headaches. Negative for dizziness, tremors, weakness and numbness.  Psychiatric/Behavioral: Negative for confusion and sleep disturbance.       Objective:   Physical Exam  Constitutional: She appears well-developed. No distress.  HENT:  Head: Normocephalic.  Right Ear: External ear normal.  Left Ear: External ear normal.  Nose: Nose normal.  Mouth/Throat: Oropharynx is clear and moist.  Eyes: Conjunctivae are normal. Pupils are equal, round, and reactive to light. Right eye exhibits no discharge. Left eye exhibits no discharge.  Neck: Normal range of motion. Neck supple. No JVD present. No tracheal deviation present. No thyromegaly present.  Cardiovascular: Normal rate, regular rhythm and normal heart sounds.   Pulmonary/Chest: No stridor. No respiratory distress. She has no wheezes.  Abdominal: Soft. Bowel sounds are normal. She exhibits no distension and no mass. There is no tenderness. There is no rebound and no guarding.  Musculoskeletal: She exhibits no edema and no tenderness.  Lymphadenopathy:    She has no cervical adenopathy.  Neurological: She  displays normal reflexes. No cranial nerve deficit. She exhibits normal muscle tone. Coordination normal.  Skin: No rash noted. No erythema.  Psychiatric: She has a normal mood and affect. Her behavior is normal. Judgment and thought content normal.          Assessment & Plan:

## 2011-10-29 NOTE — Assessment & Plan Note (Signed)
Off Rx 

## 2011-10-29 NOTE — Assessment & Plan Note (Signed)
She would like to start Relpax

## 2011-10-29 NOTE — Assessment & Plan Note (Signed)
See Meds 

## 2011-11-03 ENCOUNTER — Other Ambulatory Visit: Payer: Self-pay | Admitting: *Deleted

## 2011-11-03 MED ORDER — IBUPROFEN 600 MG PO TABS: 600.0000 mg | ORAL_TABLET | Freq: Three times a day (TID) | ORAL | Status: DC | PRN

## 2011-11-25 ENCOUNTER — Other Ambulatory Visit: Payer: Self-pay | Admitting: Obstetrics & Gynecology

## 2011-11-25 DIAGNOSIS — Z1231 Encounter for screening mammogram for malignant neoplasm of breast: Secondary | ICD-10-CM

## 2011-12-18 ENCOUNTER — Other Ambulatory Visit: Payer: Self-pay | Admitting: *Deleted

## 2011-12-18 MED ORDER — OXYCODONE HCL 15 MG PO TABS: 15.0000 mg | ORAL_TABLET | Freq: Four times a day (QID) | ORAL | Status: DC | PRN

## 2011-12-18 NOTE — Telephone Encounter (Signed)
Rx printed for physician signature.

## 2011-12-18 NOTE — Telephone Encounter (Signed)
Pt called requesting refill of Oxycodone IR 15mg , 1 tablet every 6 hours as needed, #120. Last filled 10/29/11. Please advise.

## 2011-12-19 ENCOUNTER — Other Ambulatory Visit: Payer: Self-pay | Admitting: *Deleted

## 2011-12-19 MED ORDER — OXYCODONE HCL 15 MG PO TABS: 15.0000 mg | ORAL_TABLET | Freq: Four times a day (QID) | ORAL | Status: DC | PRN

## 2011-12-19 NOTE — Telephone Encounter (Signed)
Left detailed mess informing pt Rx ready for p/u. 

## 2011-12-29 ENCOUNTER — Other Ambulatory Visit: Payer: Self-pay | Admitting: Internal Medicine

## 2011-12-30 ENCOUNTER — Ambulatory Visit
Admission: RE | Admit: 2011-12-30 | Discharge: 2011-12-30 | Disposition: A | Discharge status: Home / Self Care | Payer: BC Managed Care – PPO | Source: Ambulatory Visit | Attending: Obstetrics & Gynecology | Admitting: Obstetrics & Gynecology

## 2011-12-30 DIAGNOSIS — Z1231 Encounter for screening mammogram for malignant neoplasm of breast: Secondary | ICD-10-CM

## 2012-01-15 ENCOUNTER — Telehealth: Payer: Self-pay

## 2012-01-15 NOTE — Telephone Encounter (Signed)
OK to fill this prescription with additional refills x1 Thank you!  

## 2012-01-15 NOTE — Telephone Encounter (Signed)
Pt called requesting refill of oxycodone. 

## 2012-01-16 ENCOUNTER — Other Ambulatory Visit: Payer: Self-pay | Admitting: *Deleted

## 2012-01-16 MED ORDER — OXYCODONE HCL 15 MG PO TABS: 15.0000 mg | ORAL_TABLET | Freq: Four times a day (QID) | ORAL | Status: DC | PRN

## 2012-01-16 NOTE — Telephone Encounter (Signed)
Left detailed mess informing pt Rx ready.

## 2012-01-16 NOTE — Telephone Encounter (Signed)
Rx printed/pending MD sig. 

## 2012-02-04 ENCOUNTER — Ambulatory Visit: Payer: BC Managed Care – PPO | Admitting: Internal Medicine

## 2012-02-04 DIAGNOSIS — Z0289 Encounter for other administrative examinations: Secondary | ICD-10-CM

## 2012-02-13 ENCOUNTER — Telehealth: Payer: Self-pay | Admitting: *Deleted

## 2012-02-13 NOTE — Telephone Encounter (Signed)
Pt is requesting Rf on Oxycodone. Fill date is 02/16/12. Please advise.

## 2012-02-15 NOTE — Telephone Encounter (Signed)
OK to fill this prescription with additional refills x0 Thank you!  

## 2012-02-16 MED ORDER — OXYCODONE HCL 15 MG PO TABS: 15.0000 mg | ORAL_TABLET | Freq: Four times a day (QID) | ORAL | Status: DC | PRN

## 2012-02-16 NOTE — Telephone Encounter (Signed)
Done

## 2012-02-19 ENCOUNTER — Other Ambulatory Visit: Payer: Self-pay | Admitting: Internal Medicine

## 2012-02-25 ENCOUNTER — Encounter: Payer: Self-pay | Admitting: Internal Medicine

## 2012-02-25 ENCOUNTER — Other Ambulatory Visit (INDEPENDENT_AMBULATORY_CARE_PROVIDER_SITE_OTHER): Payer: BC Managed Care – PPO

## 2012-02-25 ENCOUNTER — Ambulatory Visit (INDEPENDENT_AMBULATORY_CARE_PROVIDER_SITE_OTHER): Payer: BC Managed Care – PPO | Admitting: Internal Medicine

## 2012-02-25 VITALS — BP 100/70 | HR 84 | Temp 97.3°F | Resp 16

## 2012-02-25 DIAGNOSIS — R7309 Other abnormal glucose: Secondary | ICD-10-CM

## 2012-02-25 DIAGNOSIS — R739 Hyperglycemia, unspecified: Secondary | ICD-10-CM

## 2012-02-25 DIAGNOSIS — R110 Nausea: Secondary | ICD-10-CM

## 2012-02-25 DIAGNOSIS — M549 Dorsalgia, unspecified: Secondary | ICD-10-CM

## 2012-02-25 DIAGNOSIS — K219 Gastro-esophageal reflux disease without esophagitis: Secondary | ICD-10-CM

## 2012-02-25 LAB — BASIC METABOLIC PANEL
BUN: 14 mg/dL (ref 6–23)
Chloride: 110 mEq/L (ref 96–112)
Potassium: 4 mEq/L (ref 3.5–5.1)
Sodium: 142 mEq/L (ref 135–145)

## 2012-02-25 LAB — HEPATIC FUNCTION PANEL
AST: 29 U/L (ref 0–37)
Alkaline Phosphatase: 138 U/L — ABNORMAL HIGH (ref 39–117)
Bilirubin, Direct: 0.1 mg/dL (ref 0.0–0.3)
Total Bilirubin: 0.6 mg/dL (ref 0.3–1.2)

## 2012-02-25 LAB — TSH: TSH: 1.68 u[IU]/mL (ref 0.35–5.50)

## 2012-02-25 LAB — HEMOGLOBIN A1C: Hgb A1c MFr Bld: 5.3 % (ref 4.6–6.5)

## 2012-02-25 MED ORDER — ONDANSETRON HCL 8 MG PO TABS
8.0000 mg | ORAL_TABLET | Freq: Two times a day (BID) | ORAL | Status: AC | PRN
Start: 2012-02-25 — End: 2012-03-03

## 2012-02-25 MED ORDER — OXYCODONE HCL 15 MG PO TABS: 15.0000 mg | ORAL_TABLET | Freq: Four times a day (QID) | ORAL | Status: DC | PRN

## 2012-02-25 MED ORDER — ZOLMITRIPTAN 5 MG NA SOLN: 1.0000 | NASAL | Status: DC | PRN

## 2012-02-25 NOTE — Progress Notes (Signed)
Patient ID: Beth Huffman, female   DOB: 1960-12-28, 51 y.o.   MRN: [id number]  Subjective:    Patient ID: Beth Huffman, female    DOB: 10/02/1961, 51 y.o.   MRN: [id number]  HPI   The patient is here to follow up on chronic LBP, migraines, anxiety, headaches and chronic moderate paresthesia symptoms controlled with medicines, diet and exercise. C/o wt gain  BP Readings from Last 3 Encounters:  02/25/12 140/100  10/29/11 120/88  08/19/11 104/70   Wt Readings from Last 3 Encounters:  09/10/11 193 lb 12.8 oz (87.907 kg)  11/22/10 153 lb (69.4 kg)  09/23/10 157 lb (71.215 kg)       Review of Systems  Constitutional: Positive for unexpected weight change (wt gain). Negative for chills, activity change, appetite change and fatigue.  HENT: Negative for congestion, mouth sores and sinus pressure.   Eyes: Negative for visual disturbance.  Respiratory: Negative for cough and chest tightness.   Gastrointestinal: Negative for nausea and abdominal pain.  Genitourinary: Negative for frequency, difficulty urinating and vaginal pain.  Musculoskeletal: Positive for back pain. Negative for gait problem.  Skin: Negative for pallor and rash.  Neurological: Positive for headaches. Negative for dizziness, tremors, weakness and numbness.  Psychiatric/Behavioral: Negative for confusion and sleep disturbance.       Objective:   Physical Exam  Constitutional: She appears well-developed. No distress.       obese  HENT:  Head: Normocephalic.  Right Ear: External ear normal.  Left Ear: External ear normal.  Nose: Nose normal.  Mouth/Throat: Oropharynx is clear and moist.  Eyes: Conjunctivae are normal. Pupils are equal, round, and reactive to light. Right eye exhibits no discharge. Left eye exhibits no discharge.  Neck: Normal range of motion. Neck supple. No JVD present. No tracheal deviation present. No thyromegaly present.  Cardiovascular: Normal rate, regular rhythm and normal heart  sounds.   Pulmonary/Chest: No stridor. No respiratory distress. She has no wheezes.  Abdominal: Soft. Bowel sounds are normal. She exhibits no distension and no mass. There is no tenderness. There is no rebound and no guarding.  Musculoskeletal: She exhibits no edema and no tenderness.  Lymphadenopathy:    She has no cervical adenopathy.  Neurological: She displays normal reflexes. No cranial nerve deficit. She exhibits normal muscle tone. Coordination normal.  Skin: No rash noted. No erythema.  Psychiatric: She has a normal mood and affect. Her behavior is normal. Judgment and thought content normal.     Lab Results  Component Value Date   WBC 5.6 08/19/2011   HGB 12.8 08/19/2011   HCT 37.3 08/19/2011   PLT 209.0 08/19/2011   GLUCOSE 79 08/19/2011   CHOL 266* 03/03/2011   TRIG 104.0 03/03/2011   HDL 91.10 03/03/2011   LDLDIRECT 144.8 03/03/2011   ALT 45* 08/19/2011   AST 51* 08/19/2011   NA 141 08/19/2011   K 3.7 08/19/2011   CL 108 08/19/2011   CREATININE 0.9 08/19/2011   BUN 17 08/19/2011   CO2 25 08/19/2011   TSH 3.55 08/19/2011   HGBA1C 5.3 08/19/2011        Assessment & Plan:

## 2012-02-25 NOTE — Assessment & Plan Note (Signed)
Relpax  Zomig nasal Topamax Zofran

## 2012-02-25 NOTE — Assessment & Plan Note (Signed)
Continue with current prescription therapy as reflected on the Med list.  

## 2012-02-25 NOTE — Assessment & Plan Note (Signed)
Check A1c. 

## 2012-03-18 ENCOUNTER — Telehealth: Payer: Self-pay | Admitting: *Deleted

## 2012-03-18 NOTE — Telephone Encounter (Signed)
PA for Relpax approved from 03/17/12-12/11/2014. Pharmacy and pt informed.

## 2012-04-06 ENCOUNTER — Other Ambulatory Visit: Payer: Self-pay | Admitting: Internal Medicine

## 2012-05-24 ENCOUNTER — Other Ambulatory Visit: Payer: Self-pay | Admitting: Internal Medicine

## 2012-06-02 ENCOUNTER — Ambulatory Visit (INDEPENDENT_AMBULATORY_CARE_PROVIDER_SITE_OTHER): Payer: BC Managed Care – PPO | Admitting: Internal Medicine

## 2012-06-02 ENCOUNTER — Encounter: Payer: Self-pay | Admitting: Internal Medicine

## 2012-06-02 VITALS — BP 140/80 | HR 80 | Temp 98.1°F | Resp 16

## 2012-06-02 DIAGNOSIS — G950 Syringomyelia and syringobulbia: Secondary | ICD-10-CM

## 2012-06-02 DIAGNOSIS — K219 Gastro-esophageal reflux disease without esophagitis: Secondary | ICD-10-CM

## 2012-06-02 DIAGNOSIS — R635 Abnormal weight gain: Secondary | ICD-10-CM

## 2012-06-02 DIAGNOSIS — M542 Cervicalgia: Secondary | ICD-10-CM

## 2012-06-02 DIAGNOSIS — M545 Low back pain, unspecified: Secondary | ICD-10-CM

## 2012-06-02 MED ORDER — ZOLMITRIPTAN 5 MG NA SOLN: 1.0000 | NASAL | Status: DC | PRN

## 2012-06-02 MED ORDER — OXYCODONE HCL 15 MG PO TABS: 15.0000 mg | ORAL_TABLET | Freq: Four times a day (QID) | ORAL | Status: DC | PRN

## 2012-06-02 MED ORDER — CYCLOBENZAPRINE HCL 5 MG PO TABS: 5.0000 mg | ORAL_TABLET | Freq: Three times a day (TID) | ORAL | Status: DC | PRN

## 2012-06-02 NOTE — Progress Notes (Signed)
   Subjective:    Patient ID: Beth Huffman, female    DOB: [birth year], 51 y.o.   MRN: [id number]  HPI   The patient is here to follow up on chronic LBP, migraines, anxiety, headaches and chronic moderate paresthesia symptoms controlled with medicines, diet and exercise. C/o wt gain  BP Readings from Last 3 Encounters:  06/02/12 140/80  02/25/12 100/70  10/29/11 120/88   Wt Readings from Last 3 Encounters:  09/10/11 193 lb 12.8 oz (87.907 kg)  11/22/10 153 lb (69.4 kg)  09/23/10 157 lb (71.215 kg)       Review of Systems  Constitutional: Positive for unexpected weight change (wt gain). Negative for chills, activity change, appetite change and fatigue.  HENT: Negative for congestion, mouth sores and sinus pressure.   Eyes: Negative for visual disturbance.  Respiratory: Negative for cough and chest tightness.   Gastrointestinal: Negative for nausea and abdominal pain.  Genitourinary: Negative for frequency, difficulty urinating and vaginal pain.  Musculoskeletal: Positive for back pain. Negative for gait problem.  Skin: Negative for pallor and rash.  Neurological: Positive for headaches. Negative for dizziness, tremors, weakness and numbness.  Psychiatric/Behavioral: Negative for confusion and disturbed wake/sleep cycle.       Objective:   Physical Exam  Constitutional: She appears well-developed. No distress.       obese  HENT:  Head: Normocephalic.  Right Ear: External ear normal.  Left Ear: External ear normal.  Nose: Nose normal.  Mouth/Throat: Oropharynx is clear and moist.  Eyes: Conjunctivae are normal. Pupils are equal, round, and reactive to light. Right eye exhibits no discharge. Left eye exhibits no discharge.  Neck: Normal range of motion. Neck supple. No JVD present. No tracheal deviation present. No thyromegaly present.  Cardiovascular: Normal rate, regular rhythm and normal heart sounds.   Pulmonary/Chest: No stridor. No respiratory distress. She has  no wheezes.  Abdominal: Soft. Bowel sounds are normal. She exhibits no distension and no mass. There is no tenderness. There is no rebound and no guarding.  Musculoskeletal: She exhibits no edema and no tenderness.  Lymphadenopathy:    She has no cervical adenopathy.  Neurological: She displays normal reflexes. No cranial nerve deficit. She exhibits normal muscle tone. Coordination normal.  Skin: No rash noted. No erythema.  Psychiatric: She has a normal mood and affect. Her behavior is normal. Judgment and thought content normal.     Lab Results  Component Value Date   WBC 5.6 08/19/2011   HGB 12.8 08/19/2011   HCT 37.3 08/19/2011   PLT 209.0 08/19/2011   GLUCOSE 86 02/25/2012   CHOL 266* 03/03/2011   TRIG 104.0 03/03/2011   HDL 91.10 03/03/2011   LDLDIRECT 144.8 03/03/2011   ALT 29 02/25/2012   AST 29 02/25/2012   NA 142 02/25/2012   K 4.0 02/25/2012   CL 110 02/25/2012   CREATININE 0.9 02/25/2012   BUN 14 02/25/2012   CO2 25 02/25/2012   TSH 1.68 02/25/2012   HGBA1C 5.3 02/25/2012        Assessment & Plan:

## 2012-06-06 NOTE — Assessment & Plan Note (Signed)
Continue with current prescription therapy as reflected on the Med list.  

## 2012-06-06 NOTE — Assessment & Plan Note (Signed)
2013 - loosing wt on diet

## 2012-06-06 NOTE — Assessment & Plan Note (Signed)
We are treating sx's

## 2012-08-06 ENCOUNTER — Other Ambulatory Visit: Payer: Self-pay | Admitting: Internal Medicine

## 2012-08-06 NOTE — Telephone Encounter (Signed)
Ok to Rf? 

## 2012-08-31 ENCOUNTER — Other Ambulatory Visit: Payer: Self-pay | Admitting: *Deleted

## 2012-08-31 ENCOUNTER — Other Ambulatory Visit: Payer: Self-pay | Admitting: Internal Medicine

## 2012-08-31 MED ORDER — AZITHROMYCIN 250 MG PO TABS: ORAL_TABLET | ORAL | Status: DC

## 2012-09-08 ENCOUNTER — Other Ambulatory Visit (INDEPENDENT_AMBULATORY_CARE_PROVIDER_SITE_OTHER): Payer: BC Managed Care – PPO

## 2012-09-08 ENCOUNTER — Ambulatory Visit (INDEPENDENT_AMBULATORY_CARE_PROVIDER_SITE_OTHER): Payer: BC Managed Care – PPO | Admitting: Internal Medicine

## 2012-09-08 ENCOUNTER — Encounter: Payer: Self-pay | Admitting: Internal Medicine

## 2012-09-08 VITALS — BP 120/90 | HR 94 | Temp 98.1°F | Wt 168.0 lb

## 2012-09-08 DIAGNOSIS — R7401 Elevation of levels of liver transaminase levels: Secondary | ICD-10-CM

## 2012-09-08 DIAGNOSIS — M545 Low back pain, unspecified: Secondary | ICD-10-CM

## 2012-09-08 DIAGNOSIS — G950 Syringomyelia and syringobulbia: Secondary | ICD-10-CM

## 2012-09-08 DIAGNOSIS — K5900 Constipation, unspecified: Secondary | ICD-10-CM

## 2012-09-08 DIAGNOSIS — R51 Headache: Secondary | ICD-10-CM

## 2012-09-08 DIAGNOSIS — R7402 Elevation of levels of lactic acid dehydrogenase (LDH): Secondary | ICD-10-CM

## 2012-09-08 DIAGNOSIS — K219 Gastro-esophageal reflux disease without esophagitis: Secondary | ICD-10-CM

## 2012-09-08 DIAGNOSIS — R748 Abnormal levels of other serum enzymes: Secondary | ICD-10-CM

## 2012-09-08 HISTORY — DX: Constipation, unspecified: K59.00

## 2012-09-08 LAB — HEPATIC FUNCTION PANEL
ALT: 28 U/L (ref 0–35)
AST: 33 U/L (ref 0–37)
Total Protein: 6.9 g/dL (ref 6.0–8.3)

## 2012-09-08 LAB — BASIC METABOLIC PANEL
BUN: 13 mg/dL (ref 6–23)
CO2: 24 mEq/L (ref 19–32)
Chloride: 109 mEq/L (ref 96–112)
Creatinine, Ser: 0.8 mg/dL (ref 0.4–1.2)
Glucose, Bld: 87 mg/dL (ref 70–99)
Potassium: 4 mEq/L (ref 3.5–5.1)

## 2012-09-08 LAB — TSH: TSH: 1.81 u[IU]/mL (ref 0.35–5.50)

## 2012-09-08 MED ORDER — OXYCODONE HCL 15 MG PO TABS: 15.0000 mg | ORAL_TABLET | Freq: Four times a day (QID) | ORAL | Status: DC | PRN

## 2012-09-08 MED ORDER — CYCLOBENZAPRINE HCL 5 MG PO TABS: 5.0000 mg | ORAL_TABLET | Freq: Three times a day (TID) | ORAL | Status: DC | PRN

## 2012-09-08 MED ORDER — ELETRIPTAN HYDROBROMIDE 40 MG PO TABS: 40.0000 mg | ORAL_TABLET | ORAL | Status: DC | PRN

## 2012-09-08 MED ORDER — LINACLOTIDE 290 MCG PO CAPS: 1.0000 | ORAL_CAPSULE | Freq: Every day | ORAL | Status: DC

## 2012-09-08 NOTE — Assessment & Plan Note (Signed)
Better  

## 2012-09-08 NOTE — Assessment & Plan Note (Signed)
Continue with current prescription therapy as reflected on the Med list.  

## 2012-09-08 NOTE — Progress Notes (Signed)
   Subjective:    Patient ID: Beth Huffman, female    DOB: [birth year], 51 y.o.   MRN: [id number]  HPI   The patient is here to follow up on chronic LBP, migraines, anxiety, headaches and chronic moderate paresthesia symptoms controlled with medicines, diet and exercise. C/o wt gain  BP Readings from Last 3 Encounters:  09/08/12 120/90  06/02/12 140/80  02/25/12 100/70   Wt Readings from Last 3 Encounters:  09/08/12 168 lb (76.204 kg)  09/10/11 193 lb 12.8 oz (87.907 kg)  11/22/10 153 lb (69.4 kg)       Review of Systems  Constitutional: Positive for unexpected weight change (wt gain). Negative for chills, activity change, appetite change and fatigue.  HENT: Negative for congestion, mouth sores and sinus pressure.   Eyes: Negative for visual disturbance.  Respiratory: Negative for cough and chest tightness.   Gastrointestinal: Negative for nausea and abdominal pain.  Genitourinary: Negative for frequency, difficulty urinating and vaginal pain.  Musculoskeletal: Positive for back pain. Negative for gait problem.  Skin: Negative for pallor and rash.  Neurological: Positive for headaches. Negative for dizziness, tremors, weakness and numbness.  Psychiatric/Behavioral: Negative for confusion and sleep disturbance.       Objective:   Physical Exam  Constitutional: She appears well-developed. No distress.       obese  HENT:  Head: Normocephalic.  Right Ear: External ear normal.  Left Ear: External ear normal.  Nose: Nose normal.  Mouth/Throat: Oropharynx is clear and moist.  Eyes: Conjunctivae normal are normal. Pupils are equal, round, and reactive to light. Right eye exhibits no discharge. Left eye exhibits no discharge.  Neck: Normal range of motion. Neck supple. No JVD present. No tracheal deviation present. No thyromegaly present.  Cardiovascular: Normal rate, regular rhythm and normal heart sounds.   Pulmonary/Chest: No stridor. No respiratory distress. She has no  wheezes.  Abdominal: Soft. Bowel sounds are normal. She exhibits no distension and no mass. There is no tenderness. There is no rebound and no guarding.  Musculoskeletal: She exhibits no edema and no tenderness.  Lymphadenopathy:    She has no cervical adenopathy.  Neurological: She displays normal reflexes. No cranial nerve deficit. She exhibits normal muscle tone. Coordination normal.  Skin: No rash noted. No erythema.  Psychiatric: She has a normal mood and affect. Her behavior is normal. Judgment and thought content normal.     Lab Results  Component Value Date   WBC 5.6 08/19/2011   HGB 12.8 08/19/2011   HCT 37.3 08/19/2011   PLT 209.0 08/19/2011   GLUCOSE 86 02/25/2012   CHOL 266* 03/03/2011   TRIG 104.0 03/03/2011   HDL 91.10 03/03/2011   LDLDIRECT 144.8 03/03/2011   ALT 29 02/25/2012   AST 29 02/25/2012   NA 142 02/25/2012   K 4.0 02/25/2012   CL 110 02/25/2012   CREATININE 0.9 02/25/2012   BUN 14 02/25/2012   CO2 25 02/25/2012   TSH 1.68 02/25/2012   HGBA1C 5.3 02/25/2012        Assessment & Plan:

## 2012-11-22 ENCOUNTER — Other Ambulatory Visit: Payer: Self-pay | Admitting: Internal Medicine

## 2012-12-08 ENCOUNTER — Ambulatory Visit: Payer: BC Managed Care – PPO | Admitting: Internal Medicine

## 2012-12-15 ENCOUNTER — Ambulatory Visit (INDEPENDENT_AMBULATORY_CARE_PROVIDER_SITE_OTHER): Payer: BC Managed Care – PPO | Admitting: Internal Medicine

## 2012-12-15 ENCOUNTER — Encounter: Payer: Self-pay | Admitting: Internal Medicine

## 2012-12-15 VITALS — BP 130/90 | HR 76 | Temp 97.2°F | Resp 16 | Wt 165.0 lb

## 2012-12-15 DIAGNOSIS — G950 Syringomyelia and syringobulbia: Secondary | ICD-10-CM

## 2012-12-15 DIAGNOSIS — R635 Abnormal weight gain: Secondary | ICD-10-CM

## 2012-12-15 DIAGNOSIS — F429 Obsessive-compulsive disorder, unspecified: Secondary | ICD-10-CM

## 2012-12-15 DIAGNOSIS — M545 Low back pain, unspecified: Secondary | ICD-10-CM

## 2012-12-15 DIAGNOSIS — K219 Gastro-esophageal reflux disease without esophagitis: Secondary | ICD-10-CM

## 2012-12-15 MED ORDER — OXYCODONE HCL 15 MG PO TABS: 15.0000 mg | ORAL_TABLET | Freq: Four times a day (QID) | ORAL | Status: DC | PRN

## 2012-12-15 MED ORDER — ELETRIPTAN HYDROBROMIDE 40 MG PO TABS: 40.0000 mg | ORAL_TABLET | ORAL | Status: DC | PRN

## 2012-12-15 MED ORDER — ZOLMITRIPTAN 5 MG NA SOLN: 1.0000 | NASAL | Status: DC | PRN

## 2012-12-15 MED ORDER — TOPIRAMATE 100 MG PO TABS: 100.0000 mg | ORAL_TABLET | Freq: Two times a day (BID) | ORAL | Status: DC

## 2012-12-15 MED ORDER — CYCLOBENZAPRINE HCL 5 MG PO TABS: 5.0000 mg | ORAL_TABLET | Freq: Three times a day (TID) | ORAL | Status: DC | PRN

## 2012-12-15 NOTE — Assessment & Plan Note (Signed)
Continue with current prescription therapy as reflected on the Med list.  

## 2012-12-15 NOTE — Progress Notes (Signed)
   Subjective:    HPI   The patient is here to follow up on chronic LBP, migraines, anxiety, headaches and chronic moderate paresthesia symptoms controlled with medicines, diet and exercise. F/u wt gain   BP Readings from Last 3 Encounters:  12/15/12 130/90  09/08/12 120/90  06/02/12 140/80   Wt Readings from Last 3 Encounters:  12/15/12 165 lb (74.844 kg)  09/08/12 168 lb (76.204 kg)  09/10/11 193 lb 12.8 oz (87.907 kg)       Review of Systems  Constitutional: Positive for unexpected weight change (wt gain). Negative for chills, activity change, appetite change and fatigue.  HENT: Negative for congestion, mouth sores and sinus pressure.   Eyes: Negative for visual disturbance.  Respiratory: Negative for cough and chest tightness.   Gastrointestinal: Negative for nausea and abdominal pain.  Genitourinary: Negative for frequency, difficulty urinating and vaginal pain.  Musculoskeletal: Positive for back pain. Negative for gait problem.  Skin: Negative for pallor and rash.  Neurological: Positive for headaches. Negative for dizziness, tremors, weakness and numbness.  Psychiatric/Behavioral: Negative for confusion and sleep disturbance.       Objective:   Physical Exam  Constitutional: She appears well-developed. No distress.  obese  HENT:  Head: Normocephalic.  Right Ear: External ear normal.  Left Ear: External ear normal.  Nose: Nose normal.  Mouth/Throat: Oropharynx is clear and moist.  Eyes: Conjunctivae are normal. Pupils are equal, round, and reactive to light. Right eye exhibits no discharge. Left eye exhibits no discharge.  Neck: Normal range of motion. Neck supple. No JVD present. No tracheal deviation present. No thyromegaly present.  Cardiovascular: Normal rate, regular rhythm and normal heart sounds.   Pulmonary/Chest: No stridor. No respiratory distress. She has no wheezes.  Abdominal: Soft. Bowel sounds are normal. She exhibits no distension and no  mass. There is no tenderness. There is no rebound and no guarding.  Musculoskeletal: She exhibits no edema and no tenderness.  Lymphadenopathy:    She has no cervical adenopathy.  Neurological: She displays normal reflexes. No cranial nerve deficit. She exhibits normal muscle tone. Coordination normal.  Skin: No rash noted. No erythema.  Psychiatric: She has a normal mood and affect. Her behavior is normal. Judgment and thought content normal.     Lab Results  Component Value Date   WBC 5.6 08/19/2011   HGB 12.8 08/19/2011   HCT 37.3 08/19/2011   PLT 209.0 08/19/2011   GLUCOSE 87 09/08/2012   CHOL 266* 03/03/2011   TRIG 104.0 03/03/2011   HDL 91.10 03/03/2011   LDLDIRECT 144.8 03/03/2011   ALT 28 09/08/2012   AST 33 09/08/2012   NA 140 09/08/2012   K 4.0 09/08/2012   CL 109 09/08/2012   CREATININE 0.8 09/08/2012   BUN 13 09/08/2012   CO2 24 09/08/2012   TSH 1.81 09/08/2012   HGBA1C 5.3 02/25/2012        Assessment & Plan:

## 2012-12-15 NOTE — Assessment & Plan Note (Signed)
Discussed.

## 2013-01-14 ENCOUNTER — Other Ambulatory Visit: Payer: Self-pay | Admitting: Internal Medicine

## 2013-02-22 ENCOUNTER — Other Ambulatory Visit: Payer: Self-pay

## 2013-02-22 DIAGNOSIS — Z1231 Encounter for screening mammogram for malignant neoplasm of breast: Secondary | ICD-10-CM

## 2013-03-07 ENCOUNTER — Other Ambulatory Visit: Payer: Self-pay | Admitting: Internal Medicine

## 2013-03-09 ENCOUNTER — Encounter: Payer: Self-pay | Admitting: Internal Medicine

## 2013-03-09 ENCOUNTER — Ambulatory Visit (INDEPENDENT_AMBULATORY_CARE_PROVIDER_SITE_OTHER): Payer: BC Managed Care – PPO | Admitting: Internal Medicine

## 2013-03-09 VITALS — BP 128/82 | HR 76 | Temp 97.2°F

## 2013-03-09 DIAGNOSIS — L255 Unspecified contact dermatitis due to plants, except food: Secondary | ICD-10-CM

## 2013-03-09 DIAGNOSIS — L237 Allergic contact dermatitis due to plants, except food: Secondary | ICD-10-CM

## 2013-03-09 MED ORDER — HYDROXYZINE HCL 10 MG PO TABS: 10.0000 mg | ORAL_TABLET | Freq: Three times a day (TID) | ORAL | Status: DC | PRN

## 2013-03-09 MED ORDER — METHYLPREDNISOLONE ACETATE 80 MG/ML IJ SUSP
80.0000 mg | Freq: Once | INTRAMUSCULAR | Status: AC
  Administered 2013-03-09: 80 mg via INTRAMUSCULAR

## 2013-03-09 NOTE — Patient Instructions (Signed)
Poison Ivy  Poison ivy is a inflammation of the skin (contact dermatitis) caused by touching the allergens on the leaves of the ivy plant following previous exposure to the plant. The rash usually appears 48 hours after exposure. The rash is usually bumps (papules) or blisters (vesicles) in a linear pattern. Depending on your own sensitivity, the rash may simply cause redness and itching, or it may also progress to blisters which may break open. These must be well cared for to prevent secondary bacterial (germ) infection, followed by scarring. Keep any open areas dry, clean, dressed, and covered with an antibacterial ointment if needed. The eyes may also get puffy. The puffiness is worst in the morning and gets better as the day progresses. This dermatitis usually heals without scarring, within 2 to 3 weeks without treatment.  HOME CARE INSTRUCTIONS   Thoroughly wash with soap and water as soon as you have been exposed to poison ivy. You have about one half hour to remove the plant resin before it will cause the rash. This washing will destroy the oil or antigen on the skin that is causing, or will cause, the rash. Be sure to wash under your fingernails as any plant resin there will continue to spread the rash. Do not rub skin vigorously when washing affected area. Poison ivy cannot spread if no oil from the plant remains on your body. A rash that has progressed to weeping sores will not spread the rash unless you have not washed thoroughly. It is also important to wash any clothes you have been wearing as these may carry active allergens. The rash will return if you wear the unwashed clothing, even several days later.  Avoidance of the plant in the future is the best measure. Poison ivy plant can be recognized by the number of leaves. Generally, poison ivy has three leaves with flowering branches on a single stem.  Diphenhydramine may be purchased over the counter and used as needed for itching. Do not drive with  this medication if it makes you drowsy.Ask your caregiver about medication for children.  SEEK MEDICAL CARE IF:   Open sores develop.   Redness spreads beyond area of rash.   You notice purulent (pus-like) discharge.   You have increased pain.   Other signs of infection develop (such as fever).  Document Released: 10/10/2000 Document Revised: 01/05/2012 Document Reviewed: 08/29/2009  [employer] Patient Information 2013 [employer].

## 2013-03-09 NOTE — Addendum Note (Signed)
Addended by: Brenton Grills C on: 03/09/2013 04:00 PM   Modules accepted: Orders

## 2013-03-09 NOTE — Progress Notes (Signed)
Subjective:    Patient ID: Beth Huffman, female    DOB: Feb 06, 1961, 52 y.o.   MRN: [id number]  HPI  Pt presents to the clinic today with c/o a rash on her upper chest and bilateral arms. This started Sunday after working in the yard. It is very itchy. The rash does seem to be spreading. She seems to think it is poison oak or poison ivy. She has had this in the past. It does feel the same. She has taken benadryl without much relief.   Review of Systems      Past Medical History  Diagnosis Date  . Syringomyelia   . GERD (gastroesophageal reflux disease)   . LBP (low back pain)   . Migraines   . Pyelonephritis 2008  . UTI (lower urinary tract infection)   . Stress 2009  . Abnormal weight gain     Current Outpatient Prescriptions  Medication Sig Dispense Refill  . cholecalciferol (VITAMIN D) 1000 UNITS tablet Take 1,000 Units by mouth daily.        . CORTISPORIN 3.5-10000-1 SOLN INSTILL 3 DROPS IN BOTH EARS 3 TIMES DAILY.  10 mL  0  . cyclobenzaprine (FLEXERIL) 5 MG tablet Take 1 tablet (5 mg total) by mouth 3 (three) times daily as needed for muscle spasms.  90 tablet  6  . eletriptan (RELPAX) 40 MG tablet One tablet by mouth at onset of headache. May repeat in 2 hours if headache persists or recurs. may repeat in 2 hours if necessary  10 tablet  11  . ibuprofen (ADVIL,MOTRIN) 600 MG tablet TAKE 1 TABLET 3 TIMES DAILY AS NEEDED FOR PAIN.  90 tablet  3  . Linaclotide 290 MCG CAPS Take 1 capsule by mouth daily.  30 capsule  11  . Multiple Vitamin (MULTIVITAMIN PO) Take 1 tablet by mouth daily.        . ondansetron (ZOFRAN) 8 MG tablet Take 8 mg by mouth every 12 (twelve) hours as needed.      Marland Kitchen oxyCODONE (ROXICODONE) 15 MG immediate release tablet Take 1 tablet (15 mg total) by mouth every 6 (six) hours as needed. Please fill on or after 02/15/13  120 tablet  0  . pantoprazole (PROTONIX) 40 MG tablet TAKE 1 TABLET ONCE DAILY.  30 tablet  5  . promethazine (PHENERGAN) 25 MG tablet  TAKE 1-2 TABLETS 4 TIMES A DAY AS NEEDED. MAX OF 6 PER DAY.  60 tablet  2  . topiramate (TOPAMAX) 100 MG tablet Take 1 tablet (100 mg total) by mouth 2 (two) times daily.  60 tablet  11  . zolmitriptan (ZOMIG) 5 MG nasal solution Place 1 spray into the nose as needed for migraine.  6 Units  11  . hydrOXYzine (ATARAX/VISTARIL) 10 MG tablet Take 1 tablet (10 mg total) by mouth 3 (three) times daily as needed for itching.  30 tablet  0   No current facility-administered medications for this visit.    Allergies  Allergen Reactions  . Cefuroxime Axetil     REACTION: nausea - but tolerates PCN  . Codeine Sulfate Nausea Only  . Fentanyl     REACTION: bad reaction  . Hydrocodone-Acetaminophen   . Nitrofurantoin     REACTION: HA  . Pregabalin     REACTION: cp  . Tramadol Hcl     REACTION: sick    Family History  Problem Relation Age of Onset  . COPD Mother   . Peripheral vascular disease Mother   .  Hypertension Mother   . Heart disease Mother   . Anxiety disorder Other   . Hypertension Father   . Heart attack Maternal Grandmother   . Stroke Maternal Grandfather   . Stroke Paternal Grandfather     History   Social History  . Marital Status: Married    Spouse Name: N/A    Number of Children: 2  . Years of Education: N/A   Occupational History  . small business owner    Social History Main Topics  . Smoking status: Never Smoker   . Smokeless tobacco: Not on file  . Alcohol Use: No  . Drug Use: No  . Sexually Active: Yes   Other Topics Concern  . Not on file   Social History Narrative  . No narrative on file     Constitutional: Denies fever, malaise, fatigue, headache or abrupt weight changes.  Skin: Pt reports rash. Denies lesions or ulcercations.  .   No other specific complaints in a complete review of systems (except as listed in HPI above).  Objective:   Physical Exam  BP 128/82  Pulse 76  Temp(Src) 97.2 F (36.2 C) (Oral)  SpO2 97% Wt Readings  from Last 3 Encounters:  12/15/12 165 lb (74.844 kg)  09/08/12 168 lb (76.204 kg)  09/10/11 193 lb 12.8 oz (87.907 kg)    General: Appears their stated age, well developed, well nourished in NAD. Skin: Warm, dry and intact. Generalized pruritic rash noted of bilateral arms and upper chest resembling poison ivy. Cardiovascular: Normal rate and rhythm. S1,S2 noted.  No murmur, rubs or gallops noted. No JVD or BLE edema. No carotid bruits noted. Pulmonary/Chest: Normal effort and positive vesicular breath sounds. No respiratory distress. No wheezes, rales or ronchi noted.         Assessment & Plan:   Contact dermatitis secondary to poison ivy:  80 mg Depo IM today eRx for Atarax TID prn  RTC as needed or if symptoms persist

## 2013-03-10 ENCOUNTER — Other Ambulatory Visit: Payer: Self-pay | Admitting: Internal Medicine

## 2013-03-10 ENCOUNTER — Telehealth: Payer: Self-pay | Admitting: *Deleted

## 2013-03-10 MED ORDER — PREDNISONE 10 MG PO TABS: ORAL_TABLET | ORAL | Status: DC

## 2013-03-10 NOTE — Telephone Encounter (Signed)
pred taper sent in, continue hydroxyzine, can use calamine lotion or oatmeal bath to help soothe the area.

## 2013-03-10 NOTE — Telephone Encounter (Signed)
Pt informed of rx for Pred pack and NP's advisement.

## 2013-03-10 NOTE — Telephone Encounter (Signed)
Pt is still having itching and trouble with poison oak. Requesting rx sent into [facility] for sxs.

## 2013-03-16 ENCOUNTER — Ambulatory Visit (INDEPENDENT_AMBULATORY_CARE_PROVIDER_SITE_OTHER): Payer: BC Managed Care – PPO | Admitting: Internal Medicine

## 2013-03-16 ENCOUNTER — Encounter: Payer: Self-pay | Admitting: Internal Medicine

## 2013-03-16 VITALS — BP 122/80 | HR 80 | Temp 98.2°F | Resp 16 | Wt 169.0 lb

## 2013-03-16 DIAGNOSIS — M545 Low back pain, unspecified: Secondary | ICD-10-CM

## 2013-03-16 DIAGNOSIS — L259 Unspecified contact dermatitis, unspecified cause: Secondary | ICD-10-CM

## 2013-03-16 DIAGNOSIS — R51 Headache: Secondary | ICD-10-CM

## 2013-03-16 DIAGNOSIS — R21 Rash and other nonspecific skin eruption: Secondary | ICD-10-CM

## 2013-03-16 DIAGNOSIS — R748 Abnormal levels of other serum enzymes: Secondary | ICD-10-CM

## 2013-03-16 DIAGNOSIS — R7401 Elevation of levels of liver transaminase levels: Secondary | ICD-10-CM

## 2013-03-16 HISTORY — DX: Unspecified contact dermatitis, unspecified cause: L25.9

## 2013-03-16 MED ORDER — IBUPROFEN 600 MG PO TABS: 600.0000 mg | ORAL_TABLET | Freq: Three times a day (TID) | ORAL | Status: DC | PRN

## 2013-03-16 MED ORDER — METHYLPREDNISOLONE ACETATE 80 MG/ML IJ SUSP
80.0000 mg | Freq: Once | INTRAMUSCULAR | Status: AC
  Administered 2013-03-16: 80 mg via INTRAMUSCULAR

## 2013-03-16 MED ORDER — OXYCODONE HCL 15 MG PO TABS: 15.0000 mg | ORAL_TABLET | Freq: Four times a day (QID) | ORAL | Status: DC | PRN

## 2013-03-16 MED ORDER — TRIAMCINOLONE ACETONIDE 0.5 % EX CREA: TOPICAL_CREAM | Freq: Three times a day (TID) | CUTANEOUS | Status: DC

## 2013-03-16 MED ORDER — HYDROXYZINE HCL 25 MG PO TABS: 25.0000 mg | ORAL_TABLET | Freq: Three times a day (TID) | ORAL | Status: DC | PRN

## 2013-03-16 NOTE — Assessment & Plan Note (Signed)
Continue with current prescription therapy as reflected on the Med list.  

## 2013-03-16 NOTE — Assessment & Plan Note (Signed)
5/14 relapsing - poison ivy Wash the dog Triam tid Depomedrol 80 mg im

## 2013-03-16 NOTE — Progress Notes (Signed)
   Subjective:    HPI   The patient is here to follow up on chronic LBP, migraines, anxiety, headaches and chronic moderate paresthesia symptoms controlled with medicines, diet and exercise. F/u wt gain   BP Readings from Last 3 Encounters:  03/16/13 122/80  03/09/13 128/82  12/15/12 130/90   Wt Readings from Last 3 Encounters:  03/16/13 169 lb (76.658 kg)  12/15/12 165 lb (74.844 kg)  09/08/12 168 lb (76.204 kg)       Review of Systems  Constitutional: Positive for unexpected weight change (wt gain). Negative for chills, activity change, appetite change and fatigue.  HENT: Negative for congestion, mouth sores and sinus pressure.   Eyes: Negative for visual disturbance.  Respiratory: Negative for cough and chest tightness.   Gastrointestinal: Negative for nausea and abdominal pain.  Genitourinary: Negative for frequency, difficulty urinating and vaginal pain.  Musculoskeletal: Positive for back pain. Negative for gait problem.  Skin: Negative for pallor and rash.  Neurological: Positive for headaches. Negative for dizziness, tremors, weakness and numbness.  Psychiatric/Behavioral: Negative for confusion and sleep disturbance.       Objective:   Physical Exam  Constitutional: She appears well-developed. No distress.  obese  HENT:  Head: Normocephalic.  Right Ear: External ear normal.  Left Ear: External ear normal.  Nose: Nose normal.  Mouth/Throat: Oropharynx is clear and moist.  Eyes: Conjunctivae are normal. Pupils are equal, round, and reactive to light. Right eye exhibits no discharge. Left eye exhibits no discharge.  Neck: Normal range of motion. Neck supple. No JVD present. No tracheal deviation present. No thyromegaly present.  Cardiovascular: Normal rate, regular rhythm and normal heart sounds.   Pulmonary/Chest: No stridor. No respiratory distress. She has no wheezes.  Abdominal: Soft. Bowel sounds are normal. She exhibits no distension and no mass. There  is no tenderness. There is no rebound and no guarding.  Musculoskeletal: She exhibits no edema and no tenderness.  Lymphadenopathy:    She has no cervical adenopathy.  Neurological: She displays normal reflexes. No cranial nerve deficit. She exhibits normal muscle tone. Coordination normal.  Skin: Rash noted. No erythema.  Psychiatric: She has a normal mood and affect. Her behavior is normal. Judgment and thought content normal.  Extensive rash on UEs, LEs, trunk, neck   Lab Results  Component Value Date   WBC 5.6 08/19/2011   HGB 12.8 08/19/2011   HCT 37.3 08/19/2011   PLT 209.0 08/19/2011   GLUCOSE 87 09/08/2012   CHOL 266* 03/03/2011   TRIG 104.0 03/03/2011   HDL 91.10 03/03/2011   LDLDIRECT 144.8 03/03/2011   ALT 28 09/08/2012   AST 33 09/08/2012   NA 140 09/08/2012   K 4.0 09/08/2012   CL 109 09/08/2012   CREATININE 0.8 09/08/2012   BUN 13 09/08/2012   CO2 24 09/08/2012   TSH 1.81 09/08/2012   HGBA1C 5.3 02/25/2012        Assessment & Plan:

## 2013-03-16 NOTE — Assessment & Plan Note (Signed)
Watching  

## 2013-03-25 ENCOUNTER — Ambulatory Visit
Admission: RE | Admit: 2013-03-25 | Discharge: 2013-03-25 | Disposition: A | Discharge status: Home / Self Care | Payer: BC Managed Care – PPO | Source: Ambulatory Visit

## 2013-03-25 DIAGNOSIS — Z1231 Encounter for screening mammogram for malignant neoplasm of breast: Secondary | ICD-10-CM

## 2013-04-08 ENCOUNTER — Other Ambulatory Visit: Payer: Self-pay | Admitting: Internal Medicine

## 2013-04-11 ENCOUNTER — Telehealth: Payer: Self-pay | Admitting: *Deleted

## 2013-04-11 MED ORDER — SUMATRIPTAN 5 MG/ACT NA SOLN: 1.0000 | NASAL | Status: DC | PRN

## 2013-04-11 NOTE — Telephone Encounter (Signed)
Ok changed Thx 

## 2013-04-11 NOTE — Telephone Encounter (Signed)
Rec fax requesting to change Zomig nasal spray to generic Imitrex nasal spray due to expensive cost. Please advise.

## 2013-04-12 NOTE — Telephone Encounter (Signed)
Left detailed mess informing pt of below.  

## 2013-04-25 ENCOUNTER — Telehealth: Payer: Self-pay | Admitting: *Deleted

## 2013-04-25 NOTE — Telephone Encounter (Signed)
rec fax requesting to change Sumatriptan nasal spray to either generic Maxalt OR generic Zomig. Please advise.

## 2013-04-26 MED ORDER — RIZATRIPTAN BENZOATE 10 MG PO TABS: 10.0000 mg | ORAL_TABLET | Freq: Once | ORAL | Status: DC | PRN

## 2013-04-26 NOTE — Telephone Encounter (Signed)
Ok Maxalt Thx 

## 2013-04-26 NOTE — Telephone Encounter (Signed)
Done

## 2013-05-04 ENCOUNTER — Telehealth: Payer: Self-pay | Admitting: *Deleted

## 2013-05-04 NOTE — Telephone Encounter (Signed)
rec fax from pharmacy requesting to change maxalt to generic Imitrex tabs as nasal spray is too expensive. Please advise.

## 2013-05-04 NOTE — Telephone Encounter (Signed)
OK if the request came from the pt Thx

## 2013-05-05 NOTE — Telephone Encounter (Signed)
Pt does want Imitrex tabs. Please advise what dose/sig/quantity?

## 2013-05-05 NOTE — Telephone Encounter (Signed)
Left mess for patient to call back.  

## 2013-05-06 MED ORDER — SUMATRIPTAN SUCCINATE 100 MG PO TABS: 100.0000 mg | ORAL_TABLET | ORAL | Status: DC | PRN

## 2013-06-17 ENCOUNTER — Ambulatory Visit (INDEPENDENT_AMBULATORY_CARE_PROVIDER_SITE_OTHER): Payer: BC Managed Care – PPO | Admitting: Internal Medicine

## 2013-06-17 ENCOUNTER — Encounter: Payer: Self-pay | Admitting: Internal Medicine

## 2013-06-17 VITALS — BP 140/90 | HR 80 | Temp 97.8°F | Resp 16 | Wt 165.0 lb

## 2013-06-17 DIAGNOSIS — M545 Low back pain, unspecified: Secondary | ICD-10-CM

## 2013-06-17 MED ORDER — OXYCODONE HCL 15 MG PO TABS: 15.0000 mg | ORAL_TABLET | Freq: Four times a day (QID) | ORAL | Status: DC | PRN

## 2013-06-17 MED ORDER — CYCLOBENZAPRINE HCL 5 MG PO TABS: 5.0000 mg | ORAL_TABLET | Freq: Three times a day (TID) | ORAL | Status: DC | PRN

## 2013-06-17 MED ORDER — PANTOPRAZOLE SODIUM 40 MG PO TBEC: 40.0000 mg | DELAYED_RELEASE_TABLET | Freq: Every day | ORAL | Status: DC

## 2013-06-17 MED ORDER — IBUPROFEN 600 MG PO TABS: 600.0000 mg | ORAL_TABLET | Freq: Three times a day (TID) | ORAL | Status: DC | PRN

## 2013-06-17 NOTE — Assessment & Plan Note (Signed)
Continue with current prescription therapy as reflected on the Med list.  

## 2013-06-30 ENCOUNTER — Encounter: Payer: Self-pay | Admitting: Internal Medicine

## 2013-06-30 ENCOUNTER — Ambulatory Visit (INDEPENDENT_AMBULATORY_CARE_PROVIDER_SITE_OTHER): Payer: BC Managed Care – PPO | Admitting: Internal Medicine

## 2013-06-30 VITALS — BP 130/92 | HR 80 | Temp 98.0°F | Resp 16 | Wt 164.0 lb

## 2013-06-30 DIAGNOSIS — G43919 Migraine, unspecified, intractable, without status migrainosus: Secondary | ICD-10-CM

## 2013-06-30 DIAGNOSIS — I951 Orthostatic hypotension: Secondary | ICD-10-CM | POA: Insufficient documentation

## 2013-06-30 DIAGNOSIS — R55 Syncope and collapse: Secondary | ICD-10-CM

## 2013-06-30 HISTORY — DX: Syncope and collapse: R55

## 2013-06-30 HISTORY — DX: Orthostatic hypotension: I95.1

## 2013-06-30 NOTE — Assessment & Plan Note (Signed)
9/14 likely Rapaflo induced D/c Rapaflo

## 2013-06-30 NOTE — Progress Notes (Signed)
Patient ID: Beth Huffman, female   DOB: 1960/11/29, 52 y.o.   MRN: [id number]   Subjective:    Loss of Consciousness This is a new problem. The current episode started in the past 7 days. The problem occurs intermittently. There was no loss of consciousness. Associated symptoms include back pain and headaches. Pertinent negatives include no abdominal pain, confusion, dizziness, nausea or weakness. There is no history of CAD.     The patient is here to follow up on chronic LBP, migraines, anxiety, headaches and chronic moderate paresthesia symptoms controlled with medicines, diet and exercise. F/u wt gain   BP Readings from Last 3 Encounters:  06/30/13 130/92  06/17/13 140/90  03/16/13 122/80   Wt Readings from Last 3 Encounters:  06/30/13 164 lb (74.39 kg)  06/17/13 165 lb (74.844 kg)  03/16/13 169 lb (76.658 kg)       Review of Systems  Constitutional: Positive for unexpected weight change (wt gain). Negative for chills, activity change, appetite change and fatigue.  HENT: Negative for congestion, mouth sores and sinus pressure.   Eyes: Negative for visual disturbance.  Respiratory: Negative for cough and chest tightness.   Cardiovascular: Positive for syncope.  Gastrointestinal: Negative for nausea and abdominal pain.  Genitourinary: Negative for frequency, difficulty urinating and vaginal pain.  Musculoskeletal: Positive for back pain. Negative for gait problem.  Skin: Negative for pallor and rash.  Neurological: Positive for headaches. Negative for dizziness, tremors, weakness and numbness.  Psychiatric/Behavioral: Negative for confusion and sleep disturbance.       Objective:   Physical Exam  Constitutional: She appears well-developed. No distress.  obese  HENT:  Head: Normocephalic.  Right Ear: External ear normal.  Left Ear: External ear normal.  Nose: Nose normal.  Mouth/Throat: Oropharynx is clear and moist.  Eyes: Conjunctivae are normal. Pupils are  equal, round, and reactive to light. Right eye exhibits no discharge. Left eye exhibits no discharge.  Neck: Normal range of motion. Neck supple. No JVD present. No tracheal deviation present. No thyromegaly present.  Cardiovascular: Normal rate, regular rhythm and normal heart sounds.   Pulmonary/Chest: No stridor. No respiratory distress. She has no wheezes.  Abdominal: Soft. Bowel sounds are normal. She exhibits no distension and no mass. There is no tenderness. There is no rebound and no guarding.  Musculoskeletal: She exhibits no edema and no tenderness.  Lymphadenopathy:    She has no cervical adenopathy.  Neurological: She displays normal reflexes. No cranial nerve deficit. She exhibits normal muscle tone. Coordination normal.  Skin: Rash noted. No erythema.  Psychiatric: She has a normal mood and affect. Her behavior is normal. Judgment and thought content normal.  Extensive rash on UEs, LEs, trunk, neck   Lab Results  Component Value Date   WBC 5.6 08/19/2011   HGB 12.8 08/19/2011   HCT 37.3 08/19/2011   PLT 209.0 08/19/2011   GLUCOSE 87 09/08/2012   CHOL 266* 03/03/2011   TRIG 104.0 03/03/2011   HDL 91.10 03/03/2011   LDLDIRECT 144.8 03/03/2011   ALT 28 09/08/2012   AST 33 09/08/2012   NA 140 09/08/2012   K 4.0 09/08/2012   CL 109 09/08/2012   CREATININE 0.8 09/08/2012   BUN 13 09/08/2012   CO2 24 09/08/2012   TSH 1.81 09/08/2012   HGBA1C 5.3 02/25/2012        Assessment & Plan:

## 2013-06-30 NOTE — Assessment & Plan Note (Signed)
9/14 likely Rapaflo induced EKG Labs D/c Rapaflo

## 2013-06-30 NOTE — Assessment & Plan Note (Signed)
Continue with current prescription therapy as reflected on the Med list.  

## 2013-07-16 NOTE — Progress Notes (Signed)
   Subjective:    HPI   The patient is here to follow up on chronic LBP, migraines, anxiety, headaches and chronic moderate paresthesia symptoms controlled with medicines, diet and exercise. F/u wt gain   BP Readings from Last 3 Encounters:  06/30/13 130/92  06/17/13 140/90  03/16/13 122/80   Wt Readings from Last 3 Encounters:  06/30/13 164 lb (74.39 kg)  06/17/13 165 lb (74.844 kg)  03/16/13 169 lb (76.658 kg)       Review of Systems  Constitutional: Positive for unexpected weight change (wt gain). Negative for chills, activity change, appetite change and fatigue.  HENT: Negative for congestion, mouth sores and sinus pressure.   Eyes: Negative for visual disturbance.  Respiratory: Negative for cough and chest tightness.   Gastrointestinal: Negative for nausea and abdominal pain.  Genitourinary: Negative for frequency, difficulty urinating and vaginal pain.  Musculoskeletal: Positive for back pain. Negative for gait problem.  Skin: Negative for pallor and rash.  Neurological: Positive for headaches. Negative for dizziness, tremors, weakness and numbness.  Psychiatric/Behavioral: Negative for confusion and sleep disturbance.       Objective:   Physical Exam  Constitutional: She appears well-developed. No distress.  obese  HENT:  Head: Normocephalic.  Right Ear: External ear normal.  Left Ear: External ear normal.  Nose: Nose normal.  Mouth/Throat: Oropharynx is clear and moist.  Eyes: Conjunctivae are normal. Pupils are equal, round, and reactive to light. Right eye exhibits no discharge. Left eye exhibits no discharge.  Neck: Normal range of motion. Neck supple. No JVD present. No tracheal deviation present. No thyromegaly present.  Cardiovascular: Normal rate, regular rhythm and normal heart sounds.   Pulmonary/Chest: No stridor. No respiratory distress. She has no wheezes.  Abdominal: Soft. Bowel sounds are normal. She exhibits no distension and no mass. There  is no tenderness. There is no rebound and no guarding.  Musculoskeletal: She exhibits no edema and no tenderness.  Lymphadenopathy:    She has no cervical adenopathy.  Neurological: She displays normal reflexes. No cranial nerve deficit. She exhibits normal muscle tone. Coordination normal.  Skin: No rash noted. No erythema.  Psychiatric: She has a normal mood and affect. Her behavior is normal. Judgment and thought content normal.     Lab Results  Component Value Date   WBC 5.6 08/19/2011   HGB 12.8 08/19/2011   HCT 37.3 08/19/2011   PLT 209.0 08/19/2011   GLUCOSE 87 09/08/2012   CHOL 266* 03/03/2011   TRIG 104.0 03/03/2011   HDL 91.10 03/03/2011   LDLDIRECT 144.8 03/03/2011   ALT 28 09/08/2012   AST 33 09/08/2012   NA 140 09/08/2012   K 4.0 09/08/2012   CL 109 09/08/2012   CREATININE 0.8 09/08/2012   BUN 13 09/08/2012   CO2 24 09/08/2012   TSH 1.81 09/08/2012   HGBA1C 5.3 02/25/2012        Assessment & Plan:

## 2013-07-28 ENCOUNTER — Ambulatory Visit: Payer: Self-pay | Admitting: Family Medicine

## 2013-08-02 ENCOUNTER — Other Ambulatory Visit: Payer: Self-pay | Admitting: *Deleted

## 2013-08-02 DIAGNOSIS — G4700 Insomnia, unspecified: Secondary | ICD-10-CM

## 2013-08-02 MED ORDER — ZOLPIDEM TARTRATE 10 MG PO TABS: 10.0000 mg | ORAL_TABLET | Freq: Every evening | ORAL | Status: DC | PRN

## 2013-09-12 ENCOUNTER — Other Ambulatory Visit: Payer: Self-pay | Admitting: Internal Medicine

## 2013-09-14 ENCOUNTER — Encounter: Payer: Self-pay | Admitting: Internal Medicine

## 2013-09-14 ENCOUNTER — Ambulatory Visit (INDEPENDENT_AMBULATORY_CARE_PROVIDER_SITE_OTHER): Payer: BC Managed Care – PPO | Admitting: Internal Medicine

## 2013-09-14 VITALS — BP 132/82 | HR 80 | Temp 97.9°F | Resp 16 | Wt 166.0 lb

## 2013-09-14 DIAGNOSIS — G950 Syringomyelia and syringobulbia: Secondary | ICD-10-CM

## 2013-09-14 DIAGNOSIS — Z23 Encounter for immunization: Secondary | ICD-10-CM

## 2013-09-14 DIAGNOSIS — Z0000 Encounter for general adult medical examination without abnormal findings: Secondary | ICD-10-CM

## 2013-09-14 DIAGNOSIS — E538 Deficiency of other specified B group vitamins: Secondary | ICD-10-CM

## 2013-09-14 DIAGNOSIS — R748 Abnormal levels of other serum enzymes: Secondary | ICD-10-CM

## 2013-09-14 DIAGNOSIS — K5900 Constipation, unspecified: Secondary | ICD-10-CM

## 2013-09-14 DIAGNOSIS — M545 Low back pain, unspecified: Secondary | ICD-10-CM

## 2013-09-14 MED ORDER — LINACLOTIDE 290 MCG PO CAPS: 290.0000 ug | ORAL_CAPSULE | Freq: Every day | ORAL | Status: DC

## 2013-09-14 MED ORDER — OXYCODONE HCL 15 MG PO TABS: 15.0000 mg | ORAL_TABLET | Freq: Four times a day (QID) | ORAL | Status: DC | PRN

## 2013-09-14 MED ORDER — NEOMYCIN-POLYMYXIN-HC 1 % OT SOLN: OTIC | Status: DC

## 2013-09-14 MED ORDER — CYCLOBENZAPRINE HCL 5 MG PO TABS: 5.0000 mg | ORAL_TABLET | Freq: Three times a day (TID) | ORAL | Status: DC | PRN

## 2013-09-14 NOTE — Assessment & Plan Note (Signed)
Chronic pain 

## 2013-09-14 NOTE — Assessment & Plan Note (Signed)
Labs

## 2013-09-14 NOTE — Progress Notes (Signed)
Pre visit review using our clinic review tool, if applicable. No additional management support is needed unless otherwise documented below in the visit note. 

## 2013-09-14 NOTE — Assessment & Plan Note (Signed)
Continue with current prescription therapy as reflected on the Med list.  

## 2013-09-14 NOTE — Assessment & Plan Note (Signed)
Linzess prn 

## 2013-09-15 ENCOUNTER — Telehealth: Payer: Self-pay | Admitting: *Deleted

## 2013-09-15 NOTE — Telephone Encounter (Signed)
Pt called states she did not receive the Nov or Dec Rx for Oxycodone after OV yesterday. She states she only received Jan 22, 15 refill.  Please advise

## 2013-09-16 MED ORDER — OXYCODONE HCL 15 MG PO TABS: 15.0000 mg | ORAL_TABLET | Freq: Four times a day (QID) | ORAL | Status: DC | PRN

## 2013-09-16 NOTE — Telephone Encounter (Signed)
OK to issue Nov and dec. Not ure what happened. Sorry! Thx

## 2013-09-16 NOTE — Telephone Encounter (Signed)
Left message on VM advising Rx ready 

## 2013-09-27 ENCOUNTER — Other Ambulatory Visit: Payer: Self-pay | Admitting: Internal Medicine

## 2013-09-30 ENCOUNTER — Telehealth: Payer: Self-pay | Admitting: *Deleted

## 2013-09-30 NOTE — Telephone Encounter (Signed)
Pt called requesting a referral to [facility].  Please advise

## 2013-10-02 NOTE — Telephone Encounter (Signed)
Nitchia, pls ask patients why they need a referral when they are asking for it.  Thx

## 2013-10-03 ENCOUNTER — Ambulatory Visit (INDEPENDENT_AMBULATORY_CARE_PROVIDER_SITE_OTHER): Payer: BC Managed Care – PPO | Admitting: Internal Medicine

## 2013-10-03 ENCOUNTER — Encounter: Payer: Self-pay | Admitting: Internal Medicine

## 2013-10-03 VITALS — BP 148/98 | HR 80 | Temp 98.3°F | Resp 16 | Wt 170.0 lb

## 2013-10-03 DIAGNOSIS — R55 Syncope and collapse: Secondary | ICD-10-CM

## 2013-10-03 NOTE — Progress Notes (Signed)
Pre visit review using our clinic review tool, if applicable. No additional management support is needed unless otherwise documented below in the visit note. 

## 2013-10-03 NOTE — Progress Notes (Signed)
Subjective:    Loss of Consciousness This is a recurrent ("sinking spells") problem. The current episode started more than 1 month ago (7 episodes since summer time). The problem occurs intermittently. She lost consciousness for a period of greater than 5 minutes. Nothing aggravates the symptoms. Associated symptoms include back pain, diaphoresis, headaches, light-headedness, malaise/fatigue, palpitations and weakness. Pertinent negatives include no abdominal pain, confusion, dizziness or nausea. Associated symptoms comments: chest was tight. She has tried nothing for the symptoms. There is no history of arrhythmia, CAD, CVA or vertigo.  Last episode on Fri last week   The patient is here to follow up on chronic LBP, migraines, anxiety, headaches and chronic moderate paresthesia symptoms controlled with medicines, diet and exercise. F/u wt gain   BP Readings from Last 3 Encounters:  10/03/13 148/98  09/14/13 132/82  06/30/13 130/92   Wt Readings from Last 3 Encounters:  10/03/13 170 lb (77.111 kg)  09/14/13 166 lb (75.297 kg)  06/30/13 164 lb (74.39 kg)       Review of Systems  Constitutional: Positive for malaise/fatigue, diaphoresis and unexpected weight change (wt gain). Negative for chills, activity change, appetite change and fatigue.  HENT: Negative for congestion, mouth sores and sinus pressure.   Eyes: Negative for visual disturbance.  Respiratory: Negative for cough and chest tightness.   Cardiovascular: Positive for palpitations and syncope.  Gastrointestinal: Negative for nausea and abdominal pain.  Genitourinary: Negative for frequency, difficulty urinating and vaginal pain.  Musculoskeletal: Positive for back pain. Negative for gait problem.  Skin: Negative for pallor and rash.  Neurological: Positive for weakness, light-headedness and headaches. Negative for dizziness, tremors and numbness.  Psychiatric/Behavioral: Negative for confusion and sleep disturbance.        Objective:   Physical Exam  Constitutional: She appears well-developed. No distress.  obese  HENT:  Head: Normocephalic.  Right Ear: External ear normal.  Left Ear: External ear normal.  Nose: Nose normal.  Mouth/Throat: Oropharynx is clear and moist.  Eyes: Conjunctivae are normal. Pupils are equal, round, and reactive to light. Right eye exhibits no discharge. Left eye exhibits no discharge.  Neck: Normal range of motion. Neck supple. No JVD present. No tracheal deviation present. No thyromegaly present.  Cardiovascular: Normal rate, regular rhythm and normal heart sounds.   Pulmonary/Chest: No stridor. No respiratory distress. She has no wheezes.  Abdominal: Soft. Bowel sounds are normal. She exhibits no distension and no mass. There is no tenderness. There is no rebound and no guarding.  Musculoskeletal: She exhibits no edema and no tenderness.  Lymphadenopathy:    She has no cervical adenopathy.  Neurological: She displays normal reflexes. No cranial nerve deficit. She exhibits normal muscle tone. Coordination normal.  Skin: No rash noted. No erythema.  Psychiatric: She has a normal mood and affect. Her behavior is normal. Judgment and thought content normal.     Lab Results  Component Value Date   WBC 5.6 08/19/2011   HGB 12.8 08/19/2011   HCT 37.3 08/19/2011   PLT 209.0 08/19/2011   GLUCOSE 87 09/08/2012   CHOL 266* 03/03/2011   TRIG 104.0 03/03/2011   HDL 91.10 03/03/2011   LDLDIRECT 144.8 03/03/2011   ALT 28 09/08/2012   AST 33 09/08/2012   NA 140 09/08/2012   K 4.0 09/08/2012   CL 109 09/08/2012   CREATININE 0.8 09/08/2012   BUN 13 09/08/2012   CO2 24 09/08/2012   TSH 1.81 09/08/2012   HGBA1C 5.3 02/25/2012  Assessment & Plan:

## 2013-10-03 NOTE — Assessment & Plan Note (Addendum)
12/14 relapsing off Rapaflo x 7 Card cons - poss PSVT vs other Try to cut back on Topamax 1/2 twice a day

## 2013-10-03 NOTE — Telephone Encounter (Signed)
Spoke with pt, she wants to see Dr Posey Rea not Cardiology

## 2013-10-03 NOTE — Patient Instructions (Signed)
Try to cut back on Topamax 1/2 twice a day

## 2013-11-07 ENCOUNTER — Ambulatory Visit: Payer: BC Managed Care – PPO | Admitting: Cardiovascular Disease

## 2013-11-09 ENCOUNTER — Encounter: Payer: Self-pay | Admitting: Cardiovascular Disease

## 2013-11-09 ENCOUNTER — Encounter (INDEPENDENT_AMBULATORY_CARE_PROVIDER_SITE_OTHER): Payer: Medicare Other

## 2013-11-09 ENCOUNTER — Encounter: Payer: Self-pay | Admitting: *Deleted

## 2013-11-09 ENCOUNTER — Ambulatory Visit (INDEPENDENT_AMBULATORY_CARE_PROVIDER_SITE_OTHER): Payer: Medicare Other | Admitting: Cardiovascular Disease

## 2013-11-09 VITALS — BP 124/68 | HR 76 | Ht 66.0 in | Wt 167.0 lb

## 2013-11-09 DIAGNOSIS — R55 Syncope and collapse: Secondary | ICD-10-CM

## 2013-11-09 NOTE — Progress Notes (Signed)
HPI:  This is a delightful 53 year-old woman who presents for evaluation of near-syncope. She describes multiple episodes of 'dipping spells' since August 2014. During these episodes she has been up on her feet and feels marked lightheadedness followed by nausea, diaphoresis, and near-syncope. She sits down and symptoms resolve over several minutes. She has not had frank syncope. She also has experienced symptomatic heart palpitations. After her near-syncopal events, she develops substernal chest pain. This also has been self-limited and resolves within a few minutes. The pain is non-radiating and feels like a pressure.  Spells occur at least every 4 days.  She has no personal history of cardiac disease. She has syringomyelia and experiences significant chronic pain related to this. She has reduced her Topamax dose as she was concerned this was contributing to her lightheadedness. She did this 2 weeks ago and has not appreciated any improvement.   She drinks only 1 cup of coffee daily and no sodas, minimal tea. She drinks lots of water. No history of smoking and no alcohol.  Outpatient Encounter Prescriptions as of 11/09/2013  Medication Sig  . cholecalciferol (VITAMIN D) 1000 UNITS tablet Take 1,000 Units by mouth daily.    . cyclobenzaprine (FLEXERIL) 5 MG tablet Take 1 tablet (5 mg total) by mouth 3 (three) times daily as needed for muscle spasms.  . hydrOXYzine (ATARAX/VISTARIL) 25 MG tablet Take 1-2 tablets (25-50 mg total) by mouth every 8 (eight) hours as needed for itching.  Marland Kitchen ibuprofen (ADVIL,MOTRIN) 600 MG tablet TAKE (1) TABLET EVERY EIGHT HOURS AS NEEDED FOR PAIN.  Marland Kitchen Linaclotide (LINZESS) 290 MCG CAPS capsule Take 1 capsule (290 mcg total) by mouth daily.  . Multiple Vitamin (MULTIVITAMIN PO) Take 1 tablet by mouth daily.    . NEOMYCIN-POLYMYXIN-HYDROCORTISONE (CORTISPORIN) 1 % SOLN otic solution INSTILL 3 DROPS IN BOTH EARS 3 TIMES DAILY.  Marland Kitchen ondansetron (ZOFRAN) 8 MG tablet TAKE 1  TABLET EVERY 12 HOURS IF NEEDED FOR NAUSEA.  Marland Kitchen oxyCODONE (ROXICODONE) 15 MG immediate release tablet Take 1 tablet (15 mg total) by mouth every 6 (six) hours as needed. Please fill on or after 10/17/13  . pantoprazole (PROTONIX) 40 MG tablet TAKE 1 TABLET ONCE DAILY.  Marland Kitchen promethazine (PHENERGAN) 25 MG tablet TAKE 1-2 TABLETS 4 TIMES A DAY AS NEEDED. MAX OF 6 PER DAY.  . rizatriptan (MAXALT) 10 MG tablet Take 1 tablet (10 mg total) by mouth once as needed for migraine. May repeat in 2 hours if needed  . SUMAtriptan (IMITREX) 100 MG tablet Take 1 tablet (100 mg total) by mouth every 2 (two) hours as needed for migraine.  . topiramate (TOPAMAX) 100 MG tablet Take 1 tablet (100 mg total) by mouth 2 (two) times daily.  Marland Kitchen triamcinolone cream (KENALOG) 0.5 % Apply topically 3 (three) times daily.  Marland Kitchen zolpidem (AMBIEN) 10 MG tablet Take 1 tablet (10 mg total) by mouth at bedtime as needed.    Cefuroxime axetil; Codeine sulfate; Fentanyl; Hydrocodone-acetaminophen; Nitrofurantoin; Pregabalin; Rapaflo; and Tramadol hcl  Past Medical History  Diagnosis Date  . Syringomyelia   . GERD (gastroesophageal reflux disease)   . LBP (low back pain)   . Migraines   . Pyelonephritis 2008  . UTI (lower urinary tract infection)   . Stress 2009  . Abnormal weight gain     Past Surgical History  Procedure Laterality Date  . Intracranial decompression surgery  June 2005    History   Social History  . Marital Status: Married  Spouse Name: N/A    Number of Children: 2  . Years of Education: N/A   Occupational History  . small business owner    Social History Main Topics  . Smoking status: Never Smoker   . Smokeless tobacco: Not on file  . Alcohol Use: No  . Drug Use: No  . Sexual Activity: Yes   Other Topics Concern  . Not on file   Social History Narrative  . No narrative on file    Family History  Problem Relation Age of Onset  . COPD Mother   . Peripheral vascular disease Mother   .  Hypertension Mother   . Heart disease Mother   . Anxiety disorder Other   . Hypertension Father   . Heart attack Maternal Grandmother   . Stroke Maternal Grandfather   . Stroke Paternal Grandfather     ROS: General: no fevers/chills/night sweats Eyes: no blurry vision, diplopia, or amaurosis ENT: no sore throat or hearing loss Resp: no cough, wheezing, or hemoptysis CV: positive for leg swelling. Otherwise negative except as per HPI GI: no abdominal pain, nausea, vomiting, diarrhea, or constipation GU: no dysuria, frequency, or hematuria Skin: no rash Neuro: positive for headache, back and leg pain Musculoskeletal: positive for back pain/leg pain Heme: no bleeding, DVT, or easy bruising Endo: no polydipsia or polyuria  BP 124/68  Pulse 76  Ht 5\' 6"  (1.676 m)  Wt 167 lb (75.751 kg)  BMI 26.97 kg/m2  PHYSICAL EXAM: Pt is alert and oriented, WD, WN, in no distress. HEENT: normal Neck: JVP normal. Carotid upstrokes normal without bruits. No thyromegaly. Lungs: equal expansion, clear bilaterally CV: Apex is discrete and nondisplaced, RRR without murmur or gallop Abd: soft, NT, +BS, no bruit, no hepatosplenomegaly Back: no CVA tenderness Ext: trace pretibial edema bilaterally        DP/PT pulses intact and =        Positive for mild varicosities bilateral lower legs Skin: warm and dry without rash Neuro: CNII-XII intact             Strength intact = bilaterally  EKG:  NSR 77 bpm, within normal limits  ASSESSMENT AND PLAN: Presyncope: careful history taken, events sounds consistent with neurodepressor syncope. Suspect relationship between meds for pain control/syringomyelia. Advised push fluids/salt as tolerated, avoid prolonged standing as much as reasonable (works/owns retail store), start light compression stockings. Will assess further with echocardiogram, event monitor to eval for underlying structural heart disease or arrhythmia. Will follow-up in March after studies are  completed.  Sherren Mocha 11/13/2013 8:57 AM

## 2013-11-09 NOTE — Patient Instructions (Signed)
Your physician recommends that you schedule a follow-up appointment on Thursday March 12 at 9:00 am  Your physician has requested that you have an echocardiogram. Echocardiography is a painless test that uses sound waves to create images of your heart. It provides your doctor with information about the size and shape of your heart and how well your heart's chambers and valves are working. This procedure takes approximately one hour. There are no restrictions for this procedure.  Your physician has recommended that you wear an event monitor. Event monitors are medical devices that record the heart's electrical activity. Doctors most often [place] these monitors to diagnose arrhythmias. Arrhythmias are problems with the speed or rhythm of the heartbeat. The monitor is a small, portable device. You can wear one while you do your normal daily activities. This is usually used to diagnose what is causing palpitations/syncope (passing out).  Your physician has recommended that you wear compression stockings when you are ambulatory during the day and evening.  You should not sleep in these stockings.  I have given you a prescription to take to [place] to get the appropriate stockings.

## 2013-11-09 NOTE — Progress Notes (Signed)
Patient ID: Beth Huffman, female   DOB: [birth year], 53 y.o.   MRN: [id number] E-Cardio verite 30 day cardiac event monitor applied to patient.

## 2013-11-10 ENCOUNTER — Ambulatory Visit: Payer: BC Managed Care – PPO | Admitting: Cardiovascular Disease

## 2013-11-16 ENCOUNTER — Ambulatory Visit (HOSPITAL_COMMUNITY): Payer: No Typology Code available for payment source | Attending: Cardiovascular Disease | Admitting: Radiology

## 2013-11-16 DIAGNOSIS — R51 Headache: Secondary | ICD-10-CM | POA: Insufficient documentation

## 2013-11-16 DIAGNOSIS — R55 Syncope and collapse: Secondary | ICD-10-CM | POA: Insufficient documentation

## 2013-11-16 NOTE — Progress Notes (Signed)
Echocardiogram performed.  

## 2013-11-17 ENCOUNTER — Telehealth: Payer: Self-pay | Admitting: Cardiovascular Disease

## 2013-11-17 NOTE — Telephone Encounter (Signed)
Spoke with pt and told her Dr. Burt Knack had not reviewed yet and we would call her once he reviewed. Alternate number she can be reached at is [phone]

## 2013-11-17 NOTE — Telephone Encounter (Signed)
New message          Pt would like to know her results from her echo on 1/21.

## 2013-11-21 NOTE — Telephone Encounter (Signed)
Called patient and informed of echo results. 

## 2013-12-01 ENCOUNTER — Telehealth: Payer: Self-pay

## 2013-12-01 NOTE — Telephone Encounter (Signed)
LMOM to get details for Dr Joseph Art from patient about wanting to go to a pain clinic.

## 2013-12-01 NOTE — Telephone Encounter (Signed)
Pt is wanting to talk with dr Joseph Art about being referred to a pain clinic

## 2013-12-02 NOTE — Telephone Encounter (Signed)
Spoke with pt, she states she never contacted our office in regards to a pain clinic .

## 2013-12-15 ENCOUNTER — Telehealth: Payer: Self-pay | Admitting: Internal Medicine

## 2013-12-15 NOTE — Telephone Encounter (Signed)
OK to fill this prescription with additional refills x0 Thank you!  

## 2013-12-15 NOTE — Telephone Encounter (Signed)
Patient is calling to request a new prescription for oxyCODONE (ROXICODONE) 15 MG immediate release tablet.

## 2013-12-16 MED ORDER — OXYCODONE HCL 15 MG PO TABS: 15.0000 mg | ORAL_TABLET | Freq: Four times a day (QID) | ORAL | Status: DC | PRN

## 2013-12-16 NOTE — Telephone Encounter (Signed)
Pt informed she can p/u Rx later today. Dr. Asa Lente signed in PCP absence, as I did not get his sig before he left office for the day. Per Dr. Asa Lente pt needs to see Alesha with [facility] prior to receiving Rx today. Contract printed and placed with Rx.

## 2013-12-21 ENCOUNTER — Ambulatory Visit (INDEPENDENT_AMBULATORY_CARE_PROVIDER_SITE_OTHER): Payer: Medicare Other | Admitting: Internal Medicine

## 2013-12-21 ENCOUNTER — Other Ambulatory Visit (INDEPENDENT_AMBULATORY_CARE_PROVIDER_SITE_OTHER): Payer: Medicare Other

## 2013-12-21 ENCOUNTER — Encounter: Payer: Self-pay | Admitting: Internal Medicine

## 2013-12-21 VITALS — BP 130/68 | HR 80 | Temp 97.6°F | Resp 16 | Ht 65.0 in | Wt 168.0 lb

## 2013-12-21 DIAGNOSIS — M545 Low back pain, unspecified: Secondary | ICD-10-CM

## 2013-12-21 DIAGNOSIS — Z23 Encounter for immunization: Secondary | ICD-10-CM

## 2013-12-21 DIAGNOSIS — G43919 Migraine, unspecified, intractable, without status migrainosus: Secondary | ICD-10-CM

## 2013-12-21 DIAGNOSIS — R55 Syncope and collapse: Secondary | ICD-10-CM

## 2013-12-21 DIAGNOSIS — Z0000 Encounter for general adult medical examination without abnormal findings: Secondary | ICD-10-CM

## 2013-12-21 DIAGNOSIS — G950 Syringomyelia and syringobulbia: Secondary | ICD-10-CM

## 2013-12-21 DIAGNOSIS — E538 Deficiency of other specified B group vitamins: Secondary | ICD-10-CM

## 2013-12-21 HISTORY — DX: Encounter for general adult medical examination without abnormal findings: Z00.00

## 2013-12-21 LAB — CBC WITH DIFFERENTIAL/PLATELET
BASOS PCT: 0.8 % (ref 0.0–3.0)
Basophils Absolute: 0 10*3/uL (ref 0.0–0.1)
Eosinophils Absolute: 0.1 10*3/uL (ref 0.0–0.7)
Eosinophils Relative: 2.6 % (ref 0.0–5.0)
HCT: 39.5 % (ref 36.0–46.0)
HEMOGLOBIN: 13 g/dL (ref 12.0–15.0)
Lymphocytes Relative: 41.4 % (ref 12.0–46.0)
Lymphs Abs: 1.9 10*3/uL (ref 0.7–4.0)
MCHC: 32.9 g/dL (ref 30.0–36.0)
MCV: 93.5 fl (ref 78.0–100.0)
MONOS PCT: 3.8 % (ref 3.0–12.0)
Monocytes Absolute: 0.2 10*3/uL (ref 0.1–1.0)
NEUTROS ABS: 2.4 10*3/uL (ref 1.4–7.7)
Neutrophils Relative %: 51.4 % (ref 43.0–77.0)
Platelets: 220 10*3/uL (ref 150.0–400.0)
RBC: 4.22 Mil/uL (ref 3.87–5.11)
RDW: 13 % (ref 11.5–14.6)
WBC: 4.7 10*3/uL (ref 4.5–10.5)

## 2013-12-21 LAB — LIPID PANEL
Cholesterol: 213 mg/dL — ABNORMAL HIGH (ref 0–200)
HDL: 67.8 mg/dL (ref >39.00)
TRIGLYCERIDES: 67 mg/dL (ref 0.0–149.0)
Total CHOL/HDL Ratio: 3
VLDL: 13.4 mg/dL (ref 0.0–40.0)

## 2013-12-21 LAB — BASIC METABOLIC PANEL
BUN: 16 mg/dL (ref 6–23)
CO2: 25 mEq/L (ref 19–32)
Calcium: 9.3 mg/dL (ref 8.4–10.5)
Chloride: 108 mEq/L (ref 96–112)
Creatinine, Ser: 0.8 mg/dL (ref 0.4–1.2)
GFR: 80.67 mL/min (ref >60.00)
GLUCOSE: 84 mg/dL (ref 70–99)
POTASSIUM: 3.8 meq/L (ref 3.5–5.1)
SODIUM: 139 meq/L (ref 135–145)

## 2013-12-21 LAB — URINALYSIS
Bilirubin Urine: NEGATIVE
Hgb urine dipstick: NEGATIVE
KETONES UR: NEGATIVE
Leukocytes, UA: NEGATIVE
Nitrite: NEGATIVE
TOTAL PROTEIN, URINE-UPE24: NEGATIVE
URINE GLUCOSE: NEGATIVE
UROBILINOGEN UA: 0.2 (ref 0.0–1.0)
pH: 6.5 (ref 5.0–8.0)

## 2013-12-21 LAB — HEPATIC FUNCTION PANEL
ALBUMIN: 4.1 g/dL (ref 3.5–5.2)
ALK PHOS: 71 U/L (ref 39–117)
ALT: 27 U/L (ref 0–35)
AST: 29 U/L (ref 0–37)
Bilirubin, Direct: 0.1 mg/dL (ref 0.0–0.3)
Total Bilirubin: 0.8 mg/dL (ref 0.3–1.2)
Total Protein: 7.1 g/dL (ref 6.0–8.3)

## 2013-12-21 LAB — VITAMIN B12: Vitamin B-12: 699 pg/mL (ref 211–911)

## 2013-12-21 LAB — TSH: TSH: 2.12 u[IU]/mL (ref 0.35–5.50)

## 2013-12-21 LAB — LDL CHOLESTEROL, DIRECT: LDL DIRECT: 130.2 mg/dL

## 2013-12-21 MED ORDER — TOPIRAMATE 100 MG PO TABS: 50.0000 mg | ORAL_TABLET | Freq: Two times a day (BID) | ORAL | Status: DC

## 2013-12-21 MED ORDER — OXYCODONE HCL 15 MG PO TABS: 15.0000 mg | ORAL_TABLET | Freq: Four times a day (QID) | ORAL | Status: DC | PRN

## 2013-12-21 MED ORDER — ZOLMITRIPTAN 5 MG NA SOLN: 1.0000 | NASAL | Status: DC | PRN

## 2013-12-21 MED ORDER — SUMATRIPTAN SUCCINATE 100 MG PO TABS: 100.0000 mg | ORAL_TABLET | ORAL | Status: DC | PRN

## 2013-12-21 NOTE — Assessment & Plan Note (Signed)
Added Zomig nasal prn

## 2013-12-21 NOTE — Progress Notes (Signed)
   Subjective:    HPI  The patient is here for a wellness exam. The patient has been doing well overall without major new physical or psychological issues going on lately.  F/u syncope, palpitations - resolved on 1/2 dose Topamax, however - c/o more HAs   The patient is here to follow up on chronic LBP, migraines, anxiety, headaches and chronic moderate paresthesia symptoms controlled with medicines, diet and exercise. F/u wt gain   BP Readings from Last 3 Encounters:  12/21/13 130/68  11/09/13 124/68  10/03/13 148/98   Wt Readings from Last 3 Encounters:  12/21/13 168 lb (76.204 kg)  11/09/13 167 lb (75.751 kg)  10/03/13 170 lb (77.111 kg)       Review of Systems  Constitutional: Positive for unexpected weight change (wt gain). Negative for chills, activity change, appetite change and fatigue.  HENT: Negative for congestion, mouth sores and sinus pressure.   Eyes: Negative for visual disturbance.  Respiratory: Negative for cough and chest tightness.   Genitourinary: Negative for frequency, difficulty urinating and vaginal pain.  Musculoskeletal: Negative for gait problem.  Skin: Negative for pallor and rash.  Neurological: Negative for tremors and numbness.  Psychiatric/Behavioral: Negative for sleep disturbance.       Objective:   Physical Exam  Constitutional: She appears well-developed. No distress.  obese  HENT:  Head: Normocephalic.  Right Ear: External ear normal.  Left Ear: External ear normal.  Nose: Nose normal.  Mouth/Throat: Oropharynx is clear and moist.  Eyes: Conjunctivae are normal. Pupils are equal, round, and reactive to light. Right eye exhibits no discharge. Left eye exhibits no discharge.  Neck: Normal range of motion. Neck supple. No JVD present. No tracheal deviation present. No thyromegaly present.  Cardiovascular: Normal rate, regular rhythm and normal heart sounds.   Pulmonary/Chest: No stridor. No respiratory distress. She has no  wheezes.  Abdominal: Soft. Bowel sounds are normal. She exhibits no distension and no mass. There is no tenderness. There is no rebound and no guarding.  Musculoskeletal: She exhibits no edema and no tenderness.  Lymphadenopathy:    She has no cervical adenopathy.  Neurological: She displays normal reflexes. No cranial nerve deficit. She exhibits normal muscle tone. Coordination normal.  Skin: No rash noted. No erythema.  Psychiatric: She has a normal mood and affect. Her behavior is normal. Judgment and thought content normal.     Lab Results  Component Value Date   WBC 5.6 08/19/2011   HGB 12.8 08/19/2011   HCT 37.3 08/19/2011   PLT 209.0 08/19/2011   GLUCOSE 87 09/08/2012   CHOL 266* 03/03/2011   TRIG 104.0 03/03/2011   HDL 91.10 03/03/2011   LDLDIRECT 144.8 03/03/2011   ALT 28 09/08/2012   AST 33 09/08/2012   NA 140 09/08/2012   K 4.0 09/08/2012   CL 109 09/08/2012   CREATININE 0.8 09/08/2012   BUN 13 09/08/2012   CO2 24 09/08/2012   TSH 1.81 09/08/2012   HGBA1C 5.3 02/25/2012        Assessment & Plan:

## 2013-12-21 NOTE — Assessment & Plan Note (Signed)
2/15 no relapsing since on 1/2 dose of Topamax

## 2013-12-21 NOTE — Progress Notes (Signed)
Pre visit review using our clinic review tool, if applicable. No additional management support is needed unless otherwise documented below in the visit note. 

## 2013-12-21 NOTE — Patient Instructions (Signed)
Exercises with a spiky ball and foam roller

## 2013-12-21 NOTE — Assessment & Plan Note (Signed)
Continue with current prescription therapy as reflected on the Med list. 2/15 L flank pain w/sitting

## 2013-12-21 NOTE — Assessment & Plan Note (Signed)
Dr Jalene Mullet at [facility]

## 2013-12-26 ENCOUNTER — Telehealth: Payer: Self-pay | Admitting: Nurse Practitioner

## 2013-12-26 NOTE — Telephone Encounter (Signed)
Left message for patient to call office for results of cardiac monitor

## 2013-12-28 NOTE — Telephone Encounter (Signed)
Reported results of 30 day monitor (sinus rhythm baseline; runs of PSVT noted , correlates with symptoms per Dr. Burt Knack) to patient who verbalized understanding. I advised that Dr. Burt Knack recommends EP referral.  Patient requested appointment with Dr. Caryl Comes and appointment was scheduled for 4/1 at 0830.  Monitor report was given to Trinidad Curet, RN, Dr. Olin Pia primary nurse.

## 2014-01-05 ENCOUNTER — Ambulatory Visit: Payer: Medicare Other | Admitting: Cardiovascular Disease

## 2014-01-09 ENCOUNTER — Encounter: Payer: Self-pay | Admitting: Internal Medicine

## 2014-01-12 ENCOUNTER — Telehealth: Payer: Self-pay | Admitting: *Deleted

## 2014-01-12 MED ORDER — CYCLOBENZAPRINE HCL 5 MG PO TABS
5.0000 mg | ORAL_TABLET | Freq: Three times a day (TID) | ORAL | Status: DC | PRN
Start: 2014-01-12 — End: 2014-04-04

## 2014-01-12 MED ORDER — IBUPROFEN 600 MG PO TABS: 600.0000 mg | ORAL_TABLET | Freq: Three times a day (TID) | ORAL | Status: DC

## 2014-01-12 NOTE — Telephone Encounter (Signed)
[place] w/1 ref [employer]

## 2014-01-12 NOTE — Telephone Encounter (Signed)
Refill done.  

## 2014-01-12 NOTE — Telephone Encounter (Signed)
Rf eq for Cyclobenzaprine 5 mg 90 day supply. Ok?

## 2014-01-17 ENCOUNTER — Other Ambulatory Visit: Payer: Self-pay | Admitting: Internal Medicine

## 2014-01-25 ENCOUNTER — Encounter: Payer: Self-pay | Admitting: Internal Medicine

## 2014-01-25 ENCOUNTER — Ambulatory Visit (INDEPENDENT_AMBULATORY_CARE_PROVIDER_SITE_OTHER): Payer: Medicare Other | Admitting: Internal Medicine

## 2014-01-25 VITALS — BP 130/86 | HR 92 | Ht 65.0 in | Wt 170.0 lb

## 2014-01-25 DIAGNOSIS — R55 Syncope and collapse: Secondary | ICD-10-CM

## 2014-01-25 DIAGNOSIS — I471 Supraventricular tachycardia: Secondary | ICD-10-CM

## 2014-01-25 NOTE — Progress Notes (Signed)
ELECTROPHYSIOLOGY CONSULT NOTE  Patient ID: Beth Huffman, MRN: [id number], DOB/AGE: 1960/12/07 53 y.o. Admit date: (Not on file) Date of Consult: 01/25/2014  Primary Physician: Walker Kehr, MD Primary Cardiologist: [facility]  Chief Complaint: palpitations   HPI Beth Huffman is a 53 y.o. female  Referred from Dr. Sherren Mocha because of symptomatic palpitations.  She has a 7-8 month history of recurrent infrequent episodes of palpitations. They typically lasts seconds. They occur one-2 times per month. They're not temporally clustered nor the associated with any specific trigger i.e. stress, caffeine, or exercise. They're most symptomatic i.e. she is aware of them, when she is standing. They're associated with presyncope but no syncope.  She was given an event recorder which is described below it demonstrates a short run of atrial tachycardia.  Echocardiogram demonstrated normal left ventricular function and cardiac structure.  Past medical history is notable for neck pain and headaches related to syringomyelia    Past Medical History  Diagnosis Date  . Syringomyelia   . GERD (gastroesophageal reflux disease)   . LBP (low back pain)   . Migraines   . Pyelonephritis 2008  . UTI (lower urinary tract infection)   . Stress 2009  . Abnormal weight gain       Surgical History:  Past Surgical History  Procedure Laterality Date  . Intracranial decompression surgery  June 2005     Home Meds: Prior to Admission medications   Medication Sig Start Date End Date Taking? Authorizing Provider  cholecalciferol (VITAMIN D) 1000 UNITS tablet Take 1,000 Units by mouth daily.     Yes Historical Provider, MD  cyclobenzaprine (FLEXERIL) 5 MG tablet Take 1 tablet (5 mg total) by mouth 3 (three) times daily as needed for muscle spasms. 01/12/14  Yes Evie Lacks Plotnikov, MD  estradiol (CLIMARA - DOSED IN MG/24 HR) 0.075 mg/24hr patch Place 0.075 mg onto the skin once a week.   Yes  Historical Provider, MD  hydrOXYzine (ATARAX/VISTARIL) 25 MG tablet Take 1-2 tablets (25-50 mg total) by mouth every 8 (eight) hours as needed for itching. 03/16/13  Yes Evie Lacks Plotnikov, MD  ibuprofen (ADVIL,MOTRIN) 600 MG tablet Take 1 tablet (600 mg total) by mouth 3 (three) times daily. 01/12/14  Yes Evie Lacks Plotnikov, MD  Linaclotide (LINZESS) 145 MCG CAPS capsule Take 145 mcg by mouth daily.   Yes Historical Provider, MD  Linaclotide (LINZESS) 290 MCG CAPS capsule Take 1 capsule (290 mcg total) by mouth daily. 09/14/13  Yes Cassandria Anger, MD  Multiple Vitamin (MULTIVITAMIN PO) Take 1 tablet by mouth daily.     Yes Historical Provider, MD  NEOMYCIN-POLYMYXIN-HYDROCORTISONE (CORTISPORIN) 1 % SOLN otic solution INSTILL 3 DROPS IN BOTH EARS 3 TIMES DAILY. 09/14/13  Yes Evie Lacks Plotnikov, MD  ondansetron (ZOFRAN) 8 MG tablet TAKE 1 TABLET EVERY 12 HOURS IF NEEDED FOR NAUSEA. 09/12/13  Yes Evie Lacks Plotnikov, MD  oxyCODONE (ROXICODONE) 15 MG immediate release tablet Take 1 tablet (15 mg total) by mouth every 6 (six) hours as needed. Please fill on or after 03/15/14 12/21/13  Yes Evie Lacks Plotnikov, MD  pantoprazole (PROTONIX) 40 MG tablet TAKE 1 TABLET ONCE DAILY. 09/12/13  Yes Evie Lacks Plotnikov, MD  promethazine (PHENERGAN) 25 MG tablet TAKE 1-2 TABLETS 4 TIMES A DAY AS NEEDED. MAX OF 6 PER DAY. 12/29/11  Yes Cassandria Anger, MD  SUMAtriptan (IMITREX) 100 MG tablet Take 1 tablet (100 mg total) by mouth every 2 (two) hours as needed for  migraine. 12/21/13  Yes Cassandria Anger, MD  topiramate (TOPAMAX) 100 MG tablet Take 1/2 tablet twice daily 01/17/14  Yes Evie Lacks Plotnikov, MD  triamcinolone cream (KENALOG) 0.5 % Apply topically 3 (three) times daily. 03/16/13  Yes Evie Lacks Plotnikov, MD  zolmitriptan (ZOMIG) 5 MG nasal solution Place 1 spray into the nose as needed for migraine. 12/21/13  Yes Evie Lacks Plotnikov, MD  zolpidem (AMBIEN) 10 MG tablet Take 1 tablet (10 mg total) by  mouth at bedtime as needed. 08/02/13  Yes Cassandria Anger, MD    Allergies: Medications:     Allergies  Allergen Reactions  . Cefuroxime Axetil     REACTION: nausea - but tolerates PCN  . Codeine Sulfate Nausea Only  . Fentanyl     REACTION: bad reaction  . Hydrocodone-Acetaminophen   . Nitrofurantoin     REACTION: HA  . Pregabalin     REACTION: cp  . Rapaflo [Silodosin]     Orthostatic BP drop, near syncope   . Tramadol Hcl     REACTION: sick    History   Social History  . Marital Status: Married    Spouse Name: N/A    Number of Children: 2  . Years of Education: N/A   Occupational History  . small business owner    Social History Main Topics  . Smoking status: Never Smoker   . Smokeless tobacco: Not on file  . Alcohol Use: No  . Drug Use: No  . Sexual Activity: Yes   Other Topics Concern  . Not on file   Social History Narrative  . No narrative on file     Family History  Problem Relation Age of Onset  . COPD Mother   . Peripheral vascular disease Mother   . Hypertension Mother   . Heart disease Mother   . Anxiety disorder Other   . Hypertension Father   . Heart attack Maternal Grandmother   . Stroke Maternal Grandfather   . Stroke Paternal Grandfather      ROS:  Please see the history of present illness.     All other systems reviewed and negative.    Physical Exam: Blood pressure 130/86, pulse 92, height 5\' 5"  (1.651 m), weight 170 lb (77.111 kg). General: Well developed, well nourished female in no acute distress. Head: Normocephalic, atraumatic, sclera non-icteric, no xanthomas, nares are without discharge. EENT: normal Lymph Nodes:  none Back: without scoliosis/kyphosis , no CVA tendersness Neck: Negative for carotid bruits. JVD not elevated. Lungs: Clear bilaterally to auscultation without wheezes, rales, or rhonchi. Breathing is unlabored. Heart: RRR with S1 S2. No   murmur , rubs, or gallops appreciated. Abdomen: Soft, non-tender,  non-distended with normoactive bowel sounds. No hepatomegaly. No rebound/guarding. No obvious abdominal masses. Msk:  Strength and tone appear normal for age. Extremities: No clubbing or cyanosis. No edema.  Distal pedal pulses are 2+ and equal bilaterally. Skin: Warm and Dry Neuro: Alert and oriented X 3. CN III-XII intact Grossly normal sensory and motor function . Psych:  Responds to questions appropriately with a normal affect.      Labs: Cardiac Enzymes No results found for this basename: CKTOTAL, CKMB, TROPONINI,  in the last 72 hours CBC Lab Results  Component Value Date   WBC 4.7 12/21/2013   HGB 13.0 12/21/2013   HCT 39.5 12/21/2013   MCV 93.5 12/21/2013   PLT 220.0 12/21/2013   PROTIME: No results found for this basename: LABPROT, INR,  in the  last 72 hours Chemistry No results found for this basename: NA, K, CL, CO2, BUN, CREATININE, CALCIUM, LABALBU, PROT, BILITOT, ALKPHOS, ALT, AST, GLUCOSE,  in the last 168 hours Lipids Lab Results  Component Value Date   CHOL 213* 12/21/2013   HDL 67.80 12/21/2013   TRIG 67.0 12/21/2013   BNP No results found for this basename: probnp   Miscellaneous No results found for this basename: DDIMER    Radiology/Studies:  No results found.  EKG: NSR  77 Intervals 13/10/37 RSR prime  Event recorder: Number of episodes of short duration i.e. less than 5 seconds of atrial tachycardia  Assessment and Plan:  Atrial tachycardia  The patient has symptomatic short runs of atrial tachycardia. These episodes last for-5 seconds. The symptomatic episodes are noted on when she is upright. She has had no syncope. Although she has had some presyncope.  We discussed treatment options including beta blockers, calcium blockers, antiarrhythmic drugs and observation. At this juncture she and her husband opted for observation.   Virl Axe

## 2014-01-25 NOTE — Patient Instructions (Signed)
Your physician recommends that you continue on your current medications as directed. Please refer to the Current Medication list given to you today.  Your physician wants you to follow-up in: 6 months with Dr. Klein. You will receive a reminder letter in the mail two months in advance. If you don't receive a letter, please call our office to schedule the follow-up appointment.  

## 2014-02-08 NOTE — Telephone Encounter (Signed)
Please close this encounter

## 2014-03-16 ENCOUNTER — Other Ambulatory Visit: Payer: Self-pay

## 2014-03-16 MED ORDER — TOPIRAMATE 100 MG PO TABS: ORAL_TABLET | ORAL | Status: DC

## 2014-03-16 MED ORDER — PANTOPRAZOLE SODIUM 40 MG PO TBEC: DELAYED_RELEASE_TABLET | ORAL | Status: DC

## 2014-03-17 ENCOUNTER — Telehealth: Payer: Self-pay | Admitting: *Deleted

## 2014-03-17 NOTE — Telephone Encounter (Signed)
Pt called states [facility] says Zomig needs a PA.  Please advise

## 2014-03-22 ENCOUNTER — Ambulatory Visit (INDEPENDENT_AMBULATORY_CARE_PROVIDER_SITE_OTHER): Payer: Medicare Other | Admitting: Internal Medicine

## 2014-03-22 ENCOUNTER — Encounter: Payer: Self-pay | Admitting: Internal Medicine

## 2014-03-22 VITALS — BP 128/76 | HR 80 | Temp 98.2°F | Wt 168.0 lb

## 2014-03-22 DIAGNOSIS — R55 Syncope and collapse: Secondary | ICD-10-CM

## 2014-03-22 DIAGNOSIS — R51 Headache: Secondary | ICD-10-CM

## 2014-03-22 DIAGNOSIS — G43919 Migraine, unspecified, intractable, without status migrainosus: Secondary | ICD-10-CM

## 2014-03-22 DIAGNOSIS — M545 Low back pain, unspecified: Secondary | ICD-10-CM

## 2014-03-22 MED ORDER — OXYCODONE HCL 15 MG PO TABS: 15.0000 mg | ORAL_TABLET | Freq: Four times a day (QID) | ORAL | Status: DC | PRN

## 2014-03-22 MED ORDER — ATENOLOL 25 MG PO TABS: 25.0000 mg | ORAL_TABLET | Freq: Two times a day (BID) | ORAL | Status: DC

## 2014-03-22 MED ORDER — SUMATRIPTAN 5 MG/ACT NA SOLN: 1.0000 | NASAL | Status: DC | PRN

## 2014-03-22 MED ORDER — LINACLOTIDE 145 MCG PO CAPS: 145.0000 ug | ORAL_CAPSULE | Freq: Every day | ORAL | Status: DC

## 2014-03-22 NOTE — Progress Notes (Signed)
Pre visit review using our clinic review tool, if applicable. No additional management support is needed unless otherwise documented below in the visit note. 

## 2014-03-22 NOTE — Assessment & Plan Note (Signed)
Continue with current prescription therapy as reflected on the Med list.  

## 2014-03-22 NOTE — Progress Notes (Signed)
   Subjective:    HPI   F/u syncope, palpitations - resolved on 1/2 dose Topamax, however - c/o more HAs. Zomig nasal is not covered by ins company   The patient is here to follow up on chronic LBP, migraines, anxiety, headaches and chronic moderate paresthesia symptoms controlled with medicines, diet and exercise. F/u wt gain   BP Readings from Last 3 Encounters:  03/22/14 128/76  01/25/14 130/86  12/21/13 130/68   Wt Readings from Last 3 Encounters:  03/22/14 168 lb (76.204 kg)  01/25/14 170 lb (77.111 kg)  12/21/13 168 lb (76.204 kg)       Review of Systems  Constitutional: Positive for unexpected weight change (wt gain). Negative for chills, activity change, appetite change and fatigue.  HENT: Negative for congestion, mouth sores and sinus pressure.   Eyes: Negative for visual disturbance.  Respiratory: Negative for cough and chest tightness.   Genitourinary: Negative for frequency, difficulty urinating and vaginal pain.  Musculoskeletal: Negative for gait problem.  Skin: Negative for pallor and rash.  Neurological: Negative for tremors and numbness.  Psychiatric/Behavioral: Negative for sleep disturbance.       Objective:   Physical Exam  Constitutional: She appears well-developed. No distress.  obese  HENT:  Head: Normocephalic.  Right Ear: External ear normal.  Left Ear: External ear normal.  Nose: Nose normal.  Mouth/Throat: Oropharynx is clear and moist.  Eyes: Conjunctivae are normal. Pupils are equal, round, and reactive to light. Right eye exhibits no discharge. Left eye exhibits no discharge.  Neck: Normal range of motion. Neck supple. No JVD present. No tracheal deviation present. No thyromegaly present.  Cardiovascular: Normal rate, regular rhythm and normal heart sounds.   Pulmonary/Chest: No stridor. No respiratory distress. She has no wheezes.  Abdominal: Soft. Bowel sounds are normal. She exhibits no distension and no mass. There is no  tenderness. There is no rebound and no guarding.  Musculoskeletal: She exhibits no edema and no tenderness.  Lymphadenopathy:    She has no cervical adenopathy.  Neurological: She displays normal reflexes. No cranial nerve deficit. She exhibits normal muscle tone. Coordination normal.  Skin: No rash noted. No erythema.  Psychiatric: She has a normal mood and affect. Her behavior is normal. Judgment and thought content normal.   Lab Results  Component Value Date   WBC 4.7 12/21/2013   HGB 13.0 12/21/2013   HCT 39.5 12/21/2013   PLT 220.0 12/21/2013   GLUCOSE 84 12/21/2013   CHOL 213* 12/21/2013   TRIG 67.0 12/21/2013   HDL 67.80 12/21/2013   LDLDIRECT 130.2 12/21/2013   ALT 27 12/21/2013   AST 29 12/21/2013   NA 139 12/21/2013   K 3.8 12/21/2013   CL 108 12/21/2013   CREATININE 0.8 12/21/2013   BUN 16 12/21/2013   CO2 25 12/21/2013   TSH 2.12 12/21/2013   HGBA1C 5.3 02/25/2012    A complex case   Subjective:    HPI          Objective:           Assessment & Plan:           Assessment & Plan:

## 2014-03-22 NOTE — Assessment & Plan Note (Signed)
Worse Will start Atenolol for HA prophylaxis. Cont low dose Topamax (tolerated). Nasal Imitrex - generic Rx  

## 2014-03-22 NOTE — Assessment & Plan Note (Signed)
Worse Will start Atenolol for HA prophylaxis. Cont low dose Topamax (tolerated). Nasal Imitrex - generic Rx

## 2014-03-22 NOTE — Assessment & Plan Note (Signed)
No relapse 

## 2014-03-23 ENCOUNTER — Telehealth: Payer: Self-pay | Admitting: *Deleted

## 2014-03-23 NOTE — Telephone Encounter (Signed)
Pt called states the Beta Blocker Rx'd is not covered by insurance.  Please advise

## 2014-03-23 NOTE — Telephone Encounter (Signed)
What do they cover? Atenolol should be a very inexpensive generic med... Thx

## 2014-03-24 NOTE — Telephone Encounter (Signed)
Left message for pt to return call.

## 2014-03-24 NOTE — Telephone Encounter (Signed)
Spoke with pt, Rx has been filled.

## 2014-04-04 ENCOUNTER — Other Ambulatory Visit: Payer: Self-pay | Admitting: Internal Medicine

## 2014-04-11 ENCOUNTER — Telehealth: Payer: Self-pay | Admitting: *Deleted

## 2014-04-11 NOTE — Telephone Encounter (Signed)
Zomig 5 mg nasal spray PA is approved effective 04/11/14 until 04/11/17. Pharmacy informed.

## 2014-04-17 ENCOUNTER — Encounter: Payer: Self-pay | Admitting: Gastroenterology

## 2014-04-20 ENCOUNTER — Telehealth: Payer: Self-pay | Admitting: *Deleted

## 2014-04-20 NOTE — Telephone Encounter (Signed)
Linzess 145 mcg PA is approved until 04/20/17. Pharmacy informed.

## 2014-05-16 ENCOUNTER — Other Ambulatory Visit: Payer: Self-pay

## 2014-05-16 MED ORDER — CYCLOBENZAPRINE HCL 5 MG PO TABS: ORAL_TABLET | ORAL | Status: DC

## 2014-05-16 MED ORDER — ATENOLOL 25 MG PO TABS: 25.0000 mg | ORAL_TABLET | Freq: Two times a day (BID) | ORAL | Status: DC

## 2014-05-25 ENCOUNTER — Other Ambulatory Visit: Payer: Self-pay | Admitting: Internal Medicine

## 2014-06-14 ENCOUNTER — Ambulatory Visit: Payer: Medicare Other | Admitting: Internal Medicine

## 2014-06-21 ENCOUNTER — Encounter: Payer: Self-pay | Admitting: Internal Medicine

## 2014-06-21 ENCOUNTER — Other Ambulatory Visit: Payer: Self-pay | Admitting: *Deleted

## 2014-06-21 ENCOUNTER — Ambulatory Visit (INDEPENDENT_AMBULATORY_CARE_PROVIDER_SITE_OTHER): Payer: Medicare Other | Admitting: Internal Medicine

## 2014-06-21 VITALS — BP 130/80 | HR 89 | Temp 98.2°F

## 2014-06-21 DIAGNOSIS — R002 Palpitations: Secondary | ICD-10-CM

## 2014-06-21 DIAGNOSIS — G950 Syringomyelia and syringobulbia: Secondary | ICD-10-CM

## 2014-06-21 DIAGNOSIS — R51 Headache: Secondary | ICD-10-CM

## 2014-06-21 DIAGNOSIS — R635 Abnormal weight gain: Secondary | ICD-10-CM

## 2014-06-21 HISTORY — DX: Palpitations: R00.2

## 2014-06-21 MED ORDER — VERAPAMIL HCL ER 120 MG PO TBCR: 120.0000 mg | EXTENDED_RELEASE_TABLET | Freq: Every day | ORAL | Status: DC

## 2014-06-21 MED ORDER — CYCLOBENZAPRINE HCL 5 MG PO TABS: ORAL_TABLET | ORAL | Status: DC

## 2014-06-21 MED ORDER — OXYCODONE HCL 15 MG PO TABS: 15.0000 mg | ORAL_TABLET | Freq: Four times a day (QID) | ORAL | Status: DC | PRN

## 2014-06-21 MED ORDER — IBUPROFEN 600 MG PO TABS: 600.0000 mg | ORAL_TABLET | Freq: Three times a day (TID) | ORAL | Status: DC

## 2014-06-21 NOTE — Assessment & Plan Note (Signed)
Start Verapamil - low dose D/c Atenolol due to wt gain

## 2014-06-21 NOTE — Assessment & Plan Note (Addendum)
Continue with current prescription therapy as reflected on the Med list.   Start Verapamil - low dose D/c Atenolol due to wt gain

## 2014-06-21 NOTE — Assessment & Plan Note (Signed)
Continue with current prescription therapy as reflected on the Med list.  

## 2014-06-21 NOTE — Progress Notes (Deleted)
Pre visit review using our clinic review tool, if applicable. No additional management support is needed unless otherwise documented below in the visit note. 

## 2014-06-21 NOTE — Progress Notes (Signed)
   Subjective:    Patient ID: Beth Huffman, female    DOB: 1960/12/31, 53 y.o.   MRN: [id number]  HPI  C/o wt gain of 15 lbs on Atenolol. Pt refused to be weighed - thinks she is 180 lbs...  F/u syncope, palpitations - resolved on 1/2 dose Topamax, however - c/o more HAs. Zomig nasal is not covered by ins company   The patient is here to follow up on chronic LBP, migraines, anxiety, headaches and chronic moderate paresthesia symptoms controlled with medicines, diet and exercise.  Wt Readings from Last 3 Encounters:  03/22/14 168 lb (76.204 kg)  01/25/14 170 lb (77.111 kg)  12/21/13 168 lb (76.204 kg)   BP Readings from Last 3 Encounters:  06/21/14 130/80  03/22/14 128/76  01/25/14 130/86      Review of Systems  Constitutional: Positive for unexpected weight change. Negative for chills, activity change, appetite change and fatigue.  HENT: Negative for congestion, mouth sores and sinus pressure.   Eyes: Negative for visual disturbance.  Respiratory: Negative for cough and chest tightness.   Gastrointestinal: Negative for nausea and abdominal pain.  Genitourinary: Negative for frequency, difficulty urinating and vaginal pain.  Musculoskeletal: Positive for arthralgias, back pain and neck pain. Negative for gait problem.  Skin: Negative for pallor and rash.  Neurological: Positive for numbness. Negative for dizziness, tremors, weakness, light-headedness and headaches.  Psychiatric/Behavioral: Negative for suicidal ideas, confusion, sleep disturbance and decreased concentration. The patient is nervous/anxious.        Objective:   Physical Exam  Constitutional: She appears well-developed. No distress.  Obese  HENT:  Head: Normocephalic.  Right Ear: External ear normal.  Left Ear: External ear normal.  Nose: Nose normal.  Mouth/Throat: Oropharynx is clear and moist.  Eyes: Conjunctivae are normal. Pupils are equal, round, and reactive to light. Right eye exhibits no  discharge. Left eye exhibits no discharge.  Neck: Normal range of motion. Neck supple. No JVD present. No tracheal deviation present. No thyromegaly present.  Cardiovascular: Normal rate, regular rhythm and normal heart sounds.   Pulmonary/Chest: No stridor. No respiratory distress. She has no wheezes.  Abdominal: Soft. Bowel sounds are normal. She exhibits no distension and no mass. There is no tenderness. There is no rebound and no guarding.  Musculoskeletal: She exhibits no edema and no tenderness.  Lymphadenopathy:    She has no cervical adenopathy.  Neurological: She displays normal reflexes. No cranial nerve deficit. She exhibits normal muscle tone. Coordination normal.  Skin: No rash noted. No erythema.  Psychiatric: She has a normal mood and affect. Her behavior is normal. Judgment and thought content normal.   Lab Results  Component Value Date   WBC 4.7 12/21/2013   HGB 13.0 12/21/2013   HCT 39.5 12/21/2013   PLT 220.0 12/21/2013   GLUCOSE 84 12/21/2013   CHOL 213* 12/21/2013   TRIG 67.0 12/21/2013   HDL 67.80 12/21/2013   LDLDIRECT 130.2 12/21/2013   ALT 27 12/21/2013   AST 29 12/21/2013   NA 139 12/21/2013   K 3.8 12/21/2013   CL 108 12/21/2013   CREATININE 0.8 12/21/2013   BUN 16 12/21/2013   CO2 25 12/21/2013   TSH 2.12 12/21/2013   HGBA1C 5.3 02/25/2012        Assessment & Plan:

## 2014-08-22 ENCOUNTER — Other Ambulatory Visit: Payer: Self-pay | Admitting: Internal Medicine

## 2014-08-23 ENCOUNTER — Ambulatory Visit (INDEPENDENT_AMBULATORY_CARE_PROVIDER_SITE_OTHER): Payer: Medicare Other | Admitting: Family

## 2014-08-23 ENCOUNTER — Encounter: Payer: Self-pay | Admitting: Family

## 2014-08-23 ENCOUNTER — Other Ambulatory Visit (INDEPENDENT_AMBULATORY_CARE_PROVIDER_SITE_OTHER): Payer: Medicare Other

## 2014-08-23 VITALS — BP 150/100 | HR 76 | Temp 98.0°F | Resp 18 | Ht 64.0 in | Wt 191.0 lb

## 2014-08-23 DIAGNOSIS — R030 Elevated blood-pressure reading, without diagnosis of hypertension: Secondary | ICD-10-CM

## 2014-08-23 DIAGNOSIS — R635 Abnormal weight gain: Secondary | ICD-10-CM

## 2014-08-23 DIAGNOSIS — R002 Palpitations: Secondary | ICD-10-CM

## 2014-08-23 DIAGNOSIS — R110 Nausea: Secondary | ICD-10-CM

## 2014-08-23 HISTORY — DX: Elevated blood-pressure reading, without diagnosis of hypertension: R03.0

## 2014-08-23 LAB — BASIC METABOLIC PANEL
BUN: 16 mg/dL (ref 6–23)
CHLORIDE: 108 meq/L (ref 96–112)
CO2: 26 meq/L (ref 19–32)
Calcium: 9.3 mg/dL (ref 8.4–10.5)
Creatinine, Ser: 0.9 mg/dL (ref 0.4–1.2)
GFR: 71.05 mL/min (ref >60.00)
GLUCOSE: 87 mg/dL (ref 70–99)
POTASSIUM: 3.9 meq/L (ref 3.5–5.1)
Sodium: 139 mEq/L (ref 135–145)

## 2014-08-23 LAB — T4, FREE: FREE T4: 0.78 ng/dL (ref 0.60–1.60)

## 2014-08-23 LAB — CBC
HCT: 38.1 % (ref 36.0–46.0)
Hemoglobin: 12.7 g/dL (ref 12.0–15.0)
MCHC: 33.4 g/dL (ref 30.0–36.0)
MCV: 92.5 fl (ref 78.0–100.0)
PLATELETS: 243 10*3/uL (ref 150.0–400.0)
RBC: 4.12 Mil/uL (ref 3.87–5.11)
RDW: 13.4 % (ref 11.5–15.5)
WBC: 6.4 10*3/uL (ref 4.0–10.5)

## 2014-08-23 MED ORDER — HYDROCHLOROTHIAZIDE 12.5 MG PO CAPS: 12.5000 mg | ORAL_CAPSULE | Freq: Every day | ORAL | Status: DC

## 2014-08-23 MED ORDER — ONDANSETRON HCL 8 MG PO TABS: ORAL_TABLET | ORAL | Status: DC

## 2014-08-23 NOTE — Progress Notes (Signed)
Pre visit review using our clinic review tool, if applicable. No additional management support is needed unless otherwise documented below in the visit note. 

## 2014-08-23 NOTE — Patient Instructions (Addendum)
Thank you for choosing [facility].  Summary/Instructions:    Please start taking the hydrochlorothiazide daily  Please double up the protonix for about a week if improvement is noted please continue if no improvement please try Zantac or Pepcid. If still no relief please return to office.   Please continue other medications as prescribed.

## 2014-08-23 NOTE — Assessment & Plan Note (Signed)
Has noted increased blood pressure during recent preventative exams. Was started on verapamil for tachycardia. Continue verapamil at this time and start hydrochlorothiazide. Keep follow up appointment with Dr. Alain Marion for electrolyte and BP check.

## 2014-08-23 NOTE — Assessment & Plan Note (Addendum)
Abdomen exam is normal. Symptoms of nausea potentially related to constipation or GERD. Increase Protonix to 40 mg BID for 1 week to determine improvement. If no relief, decrease protonix back and try Zantac or Pepcid for 1 week. Continue bowel regimen. Refill Zofran. Ordered CBC and BMET to rule out other pathophysiology. Follow up if symptoms worsen or fail to improve.

## 2014-08-23 NOTE — Progress Notes (Signed)
Subjective:    Patient ID: Beth Huffman, female    DOB: 1961-08-23, 53 y.o.   MRN: [id number]  Chief Complaint  Patient presents with  . Hypertension    Nausea and dizziness x1 weeks, refill of zofran   HPI:  Beth Huffman is a 53 y.o. female who presents today for nausea and dizziness x 1 week.  1) Increased blood pressure - last week she was at OB/GYN 178/98 and then rechecked at pharmacy 170/90. Was recently started on verapamil about 3 months ago for tachycardia. Been on most medications for about 9 years, only changes to cardiovascular drugs. Denies chest pain/discomfort, palpitations, shortness of breath or  edema.   BP Readings from Last 3 Encounters:  08/23/14 150/100  06/21/14 130/80  03/22/14 128/76   2) Nausea - Acute nausea that is experienced every time she eats has been going on for about 2 weeks. Occurs right after she eats and takes about 45-60 to resolve. No changes in bowel or bladder habits. Currently is taking a stool softeners to help with constipation from narcotic medication. Just eating makes the nausea worse. Time is the only thing currently that helps to alleviate the pain. She does take Zofran as needed - which she is requesting a refill.   Allergies  Allergen Reactions  . Atenolol     Wt gain  . Cefuroxime Axetil     REACTION: nausea - but tolerates PCN  . Codeine Sulfate Nausea Only  . Fentanyl     REACTION: bad reaction  . Hydrocodone-Acetaminophen   . Nitrofurantoin     REACTION: HA  . Pregabalin     REACTION: cp  . Rapaflo [Silodosin]     Orthostatic BP drop, near syncope   . Tramadol Hcl     REACTION: sick    Current Outpatient Prescriptions on File Prior to Visit  Medication Sig Dispense Refill  . cholecalciferol (VITAMIN D) 1000 UNITS tablet Take 1,000 Units by mouth daily.        . cyclobenzaprine (FLEXERIL) 5 MG tablet TAKE 1 TABLET THREE TIMES A DAY AS NEEDED FOR MUSCLE SPASMS  90 tablet  1  . estradiol (CLIMARA - DOSED IN  MG/24 HR) 0.075 mg/24hr patch Place 0.075 mg onto the skin once a week.      . hydrOXYzine (ATARAX/VISTARIL) 25 MG tablet Take 1-2 tablets (25-50 mg total) by mouth every 8 (eight) hours as needed for itching.  60 tablet  0  . ibuprofen (ADVIL,MOTRIN) 600 MG tablet Take 1 tablet (600 mg total) by mouth 3 (three) times daily.  90 tablet  2  . Linaclotide (LINZESS) 145 MCG CAPS capsule Take 1 capsule (145 mcg total) by mouth daily.  90 capsule  3  . Multiple Vitamin (MULTIVITAMIN PO) Take 1 tablet by mouth daily.        . NEOMYCIN-POLYMYXIN-HYDROCORTISONE (CORTISPORIN) 1 % SOLN otic solution INSTILL 3 DROPS IN BOTH EARS 3 TIMES DAILY.  10 mL  0  . oxyCODONE (ROXICODONE) 15 MG immediate release tablet Take 1 tablet (15 mg total) by mouth every 6 (six) hours as needed for pain. Please fill on or after 08/21/14  120 tablet  0  . pantoprazole (PROTONIX) 40 MG tablet TAKE 1 TABLET ONCE DAILY.  90 tablet  4  . promethazine (PHENERGAN) 25 MG tablet TAKE 1-2 TABLETS 4 TIMES A DAY AS NEEDED. MAX OF 6 PER DAY.  60 tablet  2  . SUMAtriptan (IMITREX) 100 MG tablet Take 1 tablet (  100 mg total) by mouth every 2 (two) hours as needed for migraine.  10 tablet  11  . SUMAtriptan (IMITREX) 5 MG/ACT nasal spray ONE SPRAY INTO NOSE EVERY 2 HOURS AS NEEDED FOR MIGRAINE. MAX 2 DOSESPER DAY.  1 Inhaler  0  . topiramate (TOPAMAX) 100 MG tablet Take 1/2 tablet twice daily  90 tablet  4  . triamcinolone cream (KENALOG) 0.5 % Apply topically 3 (three) times daily.  120 g  1  . verapamil (CALAN-SR) 120 MG CR tablet Take 1 tablet (120 mg total) by mouth at bedtime.  30 tablet  11  . zolmitriptan (ZOMIG) 5 MG nasal solution Place 1 spray into the nose as needed for migraine.  6 Units  5  . zolpidem (AMBIEN) 10 MG tablet Take 1 tablet (10 mg total) by mouth at bedtime as needed.  30 tablet  0   No current facility-administered medications on file prior to visit.   Past Medical History  Diagnosis Date  . Syringomyelia   . GERD  (gastroesophageal reflux disease)   . LBP (low back pain)   . Migraines   . Pyelonephritis 2008  . UTI (lower urinary tract infection)   . Stress 2009  . Abnormal weight gain    Review of Systems    See HPI  Objective:    BP 150/100  Pulse 76  Temp(Src) 98 F (36.7 C) (Oral)  Resp 18  Ht 5\' 4"  (1.626 m)  Wt 191 lb (86.637 kg)  BMI 32.77 kg/m2  SpO2 97% Nursing note and vital signs reviewed.  Physical Exam  Constitutional: She is oriented to person, place, and time. She appears well-developed and well-nourished. No distress.  Cardiovascular: Normal rate, regular rhythm and normal heart sounds.   Pulmonary/Chest: Effort normal and breath sounds normal.  Abdominal: Soft. Bowel sounds are normal. She exhibits no distension and no mass. There is no hepatosplenomegaly. There is no tenderness. There is no rebound and no guarding.  Neurological: She is alert and oriented to person, place, and time.  Skin: Skin is warm and dry.  Psychiatric: She has a normal mood and affect. Her behavior is normal. Judgment and thought content normal.       Assessment & Plan:

## 2014-09-13 ENCOUNTER — Encounter: Payer: Self-pay | Admitting: Internal Medicine

## 2014-09-13 ENCOUNTER — Ambulatory Visit (INDEPENDENT_AMBULATORY_CARE_PROVIDER_SITE_OTHER): Payer: Medicare Other | Admitting: Internal Medicine

## 2014-09-13 VITALS — BP 130/90 | HR 77 | Temp 98.1°F | Wt 194.0 lb

## 2014-09-13 DIAGNOSIS — R635 Abnormal weight gain: Secondary | ICD-10-CM

## 2014-09-13 DIAGNOSIS — G43119 Migraine with aura, intractable, without status migrainosus: Secondary | ICD-10-CM

## 2014-09-13 DIAGNOSIS — R002 Palpitations: Secondary | ICD-10-CM

## 2014-09-13 MED ORDER — OXYCODONE HCL 15 MG PO TABS: 15.0000 mg | ORAL_TABLET | Freq: Four times a day (QID) | ORAL | Status: DC | PRN

## 2014-09-13 MED ORDER — SUMATRIPTAN SUCCINATE 6.5 MG/4HR TD PTCH: 1.0000 | MEDICATED_PATCH | Freq: Every day | TRANSDERMAL | Status: DC | PRN

## 2014-09-13 NOTE — Assessment & Plan Note (Signed)
Chronic  Low carb diet Wt Readings from Last 3 Encounters:  09/13/14 194 lb (87.998 kg)  08/23/14 191 lb (86.637 kg)  03/22/14 168 lb (76.204 kg)

## 2014-09-13 NOTE — Progress Notes (Signed)
   Subjective:    Patient ID: Beth Huffman, female    DOB: 1961/08/26, 53 y.o.   MRN: [id number]  HPI  C/o migraines 2/wk req 2-3 imitrex doses  F/u wt gain of 15 lbs on Atenolol. Pt refused to be weighed - thinks she is 180 lbs...  F/u syncope, palpitations - resolved on 1/2 dose Topamax, however - c/o more HAs. Zomig nasal is not covered by ins company   The patient is here to follow up on chronic LBP, migraines, anxiety, headaches and chronic moderate paresthesia symptoms controlled with medicines, diet and exercise.  Wt Readings from Last 3 Encounters:  09/13/14 194 lb (87.998 kg)  08/23/14 191 lb (86.637 kg)  03/22/14 168 lb (76.204 kg)   BP Readings from Last 3 Encounters:  09/13/14 130/90  08/23/14 150/100  06/21/14 130/80      Review of Systems  Constitutional: Positive for unexpected weight change. Negative for chills, activity change, appetite change and fatigue.  HENT: Negative for congestion, mouth sores and sinus pressure.   Eyes: Negative for visual disturbance.  Respiratory: Negative for cough and chest tightness.   Gastrointestinal: Negative for nausea and abdominal pain.  Genitourinary: Negative for frequency, difficulty urinating and vaginal pain.  Musculoskeletal: Positive for back pain, arthralgias and neck pain. Negative for gait problem.  Skin: Negative for pallor and rash.  Neurological: Positive for numbness. Negative for dizziness, tremors, weakness, light-headedness and headaches.  Psychiatric/Behavioral: Negative for suicidal ideas, confusion, sleep disturbance and decreased concentration. The patient is nervous/anxious.        Objective:   Physical Exam  Constitutional: She appears well-developed. No distress.  HENT:  Head: Normocephalic.  Right Ear: External ear normal.  Left Ear: External ear normal.  Nose: Nose normal.  Mouth/Throat: Oropharynx is clear and moist.  Eyes: Conjunctivae are normal. Pupils are equal, round, and reactive  to light. Right eye exhibits no discharge. Left eye exhibits no discharge.  Neck: Normal range of motion. Neck supple. No JVD present. No tracheal deviation present. No thyromegaly present.  Cardiovascular: Normal rate, regular rhythm and normal heart sounds.   Pulmonary/Chest: No stridor. No respiratory distress. She has no wheezes.  Abdominal: Soft. Bowel sounds are normal. She exhibits no distension and no mass. There is no tenderness. There is no rebound and no guarding.  Musculoskeletal: She exhibits no edema or tenderness.  Lymphadenopathy:    She has no cervical adenopathy.  Neurological: She displays normal reflexes. No cranial nerve deficit. She exhibits normal muscle tone. Coordination normal.  Skin: No rash noted. No erythema.  Psychiatric: She has a normal mood and affect. Her behavior is normal. Judgment and thought content normal.   Lab Results  Component Value Date   WBC 6.4 08/23/2014   HGB 12.7 08/23/2014   HCT 38.1 08/23/2014   PLT 243.0 08/23/2014   GLUCOSE 87 08/23/2014   CHOL 213* 12/21/2013   TRIG 67.0 12/21/2013   HDL 67.80 12/21/2013   LDLDIRECT 130.2 12/21/2013   ALT 27 12/21/2013   AST 29 12/21/2013   NA 139 08/23/2014   K 3.9 08/23/2014   CL 108 08/23/2014   CREATININE 0.9 08/23/2014   BUN 16 08/23/2014   CO2 26 08/23/2014   TSH 2.12 12/21/2013   HGBA1C 5.3 02/25/2012        Assessment & Plan:

## 2014-09-13 NOTE — Assessment & Plan Note (Signed)
Will try Zecuity patch

## 2014-09-13 NOTE — Progress Notes (Signed)
Pre visit review using our clinic review tool, if applicable. No additional management support is needed unless otherwise documented below in the visit note. 

## 2014-09-13 NOTE — Assessment & Plan Note (Signed)
Resolved - very rare now

## 2014-10-04 ENCOUNTER — Other Ambulatory Visit: Payer: Self-pay

## 2014-10-21 ENCOUNTER — Other Ambulatory Visit: Payer: Self-pay | Admitting: Internal Medicine

## 2014-10-26 ENCOUNTER — Telehealth: Payer: Self-pay | Admitting: Internal Medicine

## 2014-10-26 NOTE — Telephone Encounter (Signed)
Thayer Headings with [employer] is requesting an update on prior auth for SUMAtriptan Succinate (ZECUITY) 6.5 MG/4HR PTCH. I do no see any notes in the system. Please advise

## 2014-10-31 NOTE — Telephone Encounter (Signed)
Completed PA form faxed. Left detailed mess informing Thayer Headings.

## 2014-11-07 ENCOUNTER — Other Ambulatory Visit: Payer: Self-pay | Admitting: Family

## 2014-11-30 ENCOUNTER — Other Ambulatory Visit: Payer: Self-pay | Admitting: Internal Medicine

## 2014-12-13 ENCOUNTER — Other Ambulatory Visit: Payer: Self-pay | Admitting: Geriatric Medicine

## 2014-12-13 MED ORDER — HYDROCHLOROTHIAZIDE 12.5 MG PO CAPS: 12.5000 mg | ORAL_CAPSULE | Freq: Every day | ORAL | Status: DC

## 2014-12-20 ENCOUNTER — Encounter: Payer: Self-pay | Admitting: Internal Medicine

## 2014-12-20 ENCOUNTER — Ambulatory Visit (INDEPENDENT_AMBULATORY_CARE_PROVIDER_SITE_OTHER): Payer: 59 | Admitting: Internal Medicine

## 2014-12-20 VITALS — BP 120/80 | HR 96 | Temp 97.9°F | Wt 200.8 lb

## 2014-12-20 DIAGNOSIS — M545 Low back pain, unspecified: Secondary | ICD-10-CM

## 2014-12-20 DIAGNOSIS — G950 Syringomyelia and syringobulbia: Secondary | ICD-10-CM | POA: Diagnosis not present

## 2014-12-20 DIAGNOSIS — R635 Abnormal weight gain: Secondary | ICD-10-CM | POA: Diagnosis not present

## 2014-12-20 MED ORDER — TOPIRAMATE 200 MG PO TABS: 200.0000 mg | ORAL_TABLET | Freq: Two times a day (BID) | ORAL | Status: DC

## 2014-12-20 MED ORDER — OXYCODONE HCL 15 MG PO TABS: 15.0000 mg | ORAL_TABLET | Freq: Four times a day (QID) | ORAL | Status: DC | PRN

## 2014-12-20 NOTE — Progress Notes (Signed)
   Subjective:    Patient ID: Beth Huffman, female    DOB: [birth year], 54 y.o.   MRN: [id number]  HPI  F/u migraines - better w/patches  F/u wt gain of 30 lbs on Atenolol. Pt refused to be weighed - thinks she is 180 lbs... Started wt watchers  F/u syncope, palpitations - resolved on 1/2 dose Topamax, however - c/o more HAs. Zomig nasal is not covered by ins company   The patient is here to follow up on chronic LBP, migraines, anxiety, headaches and chronic moderate paresthesia symptoms controlled with medicines, diet and exercise.  Wt Readings from Last 3 Encounters:  12/20/14 200 lb 12 oz (91.06 kg)  09/13/14 194 lb (87.998 kg)  08/23/14 191 lb (86.637 kg)   BP Readings from Last 3 Encounters:  12/20/14 120/80  09/13/14 130/90  08/23/14 150/100      Review of Systems  Constitutional: Positive for unexpected weight change. Negative for chills, activity change, appetite change and fatigue.  HENT: Negative for congestion, mouth sores and sinus pressure.   Eyes: Negative for visual disturbance.  Respiratory: Negative for cough and chest tightness.   Gastrointestinal: Negative for nausea and abdominal pain.  Genitourinary: Negative for frequency, difficulty urinating and vaginal pain.  Musculoskeletal: Positive for back pain, arthralgias and neck pain. Negative for gait problem.  Skin: Negative for pallor and rash.  Neurological: Positive for numbness. Negative for dizziness, tremors, weakness, light-headedness and headaches.  Psychiatric/Behavioral: Negative for suicidal ideas, confusion, sleep disturbance and decreased concentration. The patient is nervous/anxious.        Objective:   Physical Exam  Constitutional: She appears well-developed. No distress.  HENT:  Head: Normocephalic.  Right Ear: External ear normal.  Left Ear: External ear normal.  Nose: Nose normal.  Mouth/Throat: Oropharynx is clear and moist.  Eyes: Conjunctivae are normal. Pupils are equal,  round, and reactive to light. Right eye exhibits no discharge. Left eye exhibits no discharge.  Neck: Normal range of motion. Neck supple. No JVD present. No tracheal deviation present. No thyromegaly present.  Cardiovascular: Normal rate, regular rhythm and normal heart sounds.   Pulmonary/Chest: No stridor. No respiratory distress. She has no wheezes.  Abdominal: Soft. Bowel sounds are normal. She exhibits no distension and no mass. There is no tenderness. There is no rebound and no guarding.  Musculoskeletal: She exhibits no edema or tenderness.  Lymphadenopathy:    She has no cervical adenopathy.  Neurological: She displays normal reflexes. No cranial nerve deficit. She exhibits normal muscle tone. Coordination normal.  Skin: No rash noted. No erythema.  Psychiatric: She has a normal mood and affect. Her behavior is normal. Judgment and thought content normal.   Lab Results  Component Value Date   WBC 6.4 08/23/2014   HGB 12.7 08/23/2014   HCT 38.1 08/23/2014   PLT 243.0 08/23/2014   GLUCOSE 87 08/23/2014   CHOL 213* 12/21/2013   TRIG 67.0 12/21/2013   HDL 67.80 12/21/2013   LDLDIRECT 130.2 12/21/2013   ALT 27 12/21/2013   AST 29 12/21/2013   NA 139 08/23/2014   K 3.9 08/23/2014   CL 108 08/23/2014   CREATININE 0.9 08/23/2014   BUN 16 08/23/2014   CO2 26 08/23/2014   TSH 2.12 12/21/2013   HGBA1C 5.3 02/25/2012        Assessment & Plan:

## 2014-12-20 NOTE — Progress Notes (Signed)
Pre visit review using our clinic review tool, if applicable. No additional management support is needed unless otherwise documented below in the visit note. 

## 2014-12-25 NOTE — Assessment & Plan Note (Signed)
Cont w/current pain management Oxycodone Rx provided

## 2014-12-25 NOTE — Assessment & Plan Note (Signed)
Discussed low-carb diet 

## 2015-01-18 ENCOUNTER — Other Ambulatory Visit: Payer: Self-pay | Admitting: Internal Medicine

## 2015-01-28 ENCOUNTER — Encounter: Payer: Self-pay | Admitting: Gastroenterology

## 2015-02-28 ENCOUNTER — Other Ambulatory Visit: Payer: Self-pay | Admitting: Internal Medicine

## 2015-02-28 NOTE — Telephone Encounter (Signed)
Please advise, thanks.

## 2015-03-02 NOTE — Telephone Encounter (Signed)
Left message advising patient that refill for ibuprofen has been called in to [facility]

## 2015-03-20 ENCOUNTER — Encounter: Payer: Self-pay | Admitting: Family Medicine

## 2015-03-20 ENCOUNTER — Ambulatory Visit (INDEPENDENT_AMBULATORY_CARE_PROVIDER_SITE_OTHER): Payer: 59 | Admitting: Family Medicine

## 2015-03-20 VITALS — BP 132/88 | HR 108 | Temp 98.4°F | Wt 194.0 lb

## 2015-03-20 DIAGNOSIS — G43819 Other migraine, intractable, without status migrainosus: Secondary | ICD-10-CM

## 2015-03-20 MED ORDER — PROMETHAZINE HCL 25 MG/ML IJ SOLN
12.5000 mg | INTRAMUSCULAR | Status: AC
  Administered 2015-03-20: 12.5 mg via INTRAMUSCULAR

## 2015-03-20 MED ORDER — MEPERIDINE HCL 75 MG/ML IJ SOLN
75.0000 mg | Freq: Once | INTRAMUSCULAR | Status: AC
  Administered 2015-03-20: 75 mg via INTRAMUSCULAR

## 2015-03-20 MED ORDER — PREDNISONE 20 MG PO TABS: ORAL_TABLET | ORAL | Status: DC

## 2015-03-20 NOTE — Progress Notes (Signed)
Subjective:    Patient ID: Beth Huffman, female    DOB: 12-13-1960, 54 y.o.   MRN: [id number]  HPI Patient seen for acute visit. 3 day history of constant, throbbing, bilateral headache. Mostly behind both eyes. She has some nausea without vomiting. She has history of reported vascular headaches and also history of syringomyelia surgery over 10 years ago. She's had somewhat similar headaches in the past though usually not as prolonged. She takes Topamax for prevention. She has not had any fever, chills, recent head injury, or any symptoms of sinusitis. No photosensitivity. Symptoms are worse with movement.  Tried multiple medications including Imitrex, Sumatriptan patch, Ibuprofen, and aspirin without relief. She takes chronic oxycodone low dosage which does not help. Denies any focal weakness, speech changes, dysphagia, or any ataxia.  No cognitive changes.    Past Medical History  Diagnosis Date  . Syringomyelia   . GERD (gastroesophageal reflux disease)   . LBP (low back pain)   . Migraines   . Pyelonephritis 2008  . UTI (lower urinary tract infection)   . Stress 2009  . Abnormal weight gain    Past Surgical History  Procedure Laterality Date  . Intracranial decompression surgery  June 2005    reports that she has never smoked. She does not have any smokeless tobacco history on file. She reports that she does not drink alcohol or use illicit drugs. family history includes Anxiety disorder in her other; COPD in her mother; Heart attack in her maternal grandmother; Heart disease in her mother; Hypertension in her father and mother; Peripheral vascular disease in her mother; Stroke in her maternal grandfather and paternal grandfather. Allergies  Allergen Reactions  . Atenolol     Wt gain  . Cefuroxime Axetil     REACTION: nausea - but tolerates PCN  . Codeine Sulfate Nausea Only  . Fentanyl     REACTION: bad reaction  . Hydrocodone-Acetaminophen   . Nitrofurantoin    REACTION: HA  . Pregabalin     REACTION: cp  . Rapaflo [Silodosin]     Orthostatic BP drop, near syncope   . Tramadol Hcl     REACTION: sick      Review of Systems  Constitutional: Positive for fatigue. Negative for fever, chills and unexpected weight change.  HENT: Negative for congestion, sinus pressure and trouble swallowing.   Eyes: Negative for visual disturbance.  Respiratory: Negative for cough.   Cardiovascular: Negative for chest pain.  Neurological: Positive for headaches. Negative for dizziness.  Psychiatric/Behavioral: Negative for confusion.       Objective:   Physical Exam  Constitutional: She is oriented to person, place, and time. She appears well-developed and well-nourished.  Eyes: Pupils are equal, round, and reactive to light.  Fundi not well seen.  Neck: Neck supple. No thyromegaly present.  Cardiovascular: Normal rate and regular rhythm.   Pulmonary/Chest: Effort normal and breath sounds normal. No respiratory distress. She has no wheezes. She has no rales.  Neurological: She is alert and oriented to person, place, and time. No cranial nerve deficit. Coordination normal.  No focal strength deficits.          Assessment & Plan:  Intractable headache. Question vascular/migraine. No red flags such as fever, stiff neck, focal neurologic findings. She's not gotten relief with her typical medications. Demerol 75 mg IM and Phenergan 12.5 mg. Prednisone 40 mg daily for 5 days. She has follow-up already scheduled with primary tomorrow. May need repeat imaging if headaches persist.  Patient did get about 90% relief of headache 30 minutes after Demerol injection.  She is accompanied by husband who will be driving.  She has follow up with primary physician tomorrow.

## 2015-03-20 NOTE — Progress Notes (Signed)
Pre visit review using our clinic review tool, if applicable. No additional management support is needed unless otherwise documented below in the visit note. 

## 2015-03-20 NOTE — Patient Instructions (Signed)

## 2015-03-21 ENCOUNTER — Encounter: Payer: Self-pay | Admitting: Internal Medicine

## 2015-03-21 ENCOUNTER — Ambulatory Visit (INDEPENDENT_AMBULATORY_CARE_PROVIDER_SITE_OTHER): Payer: 59 | Admitting: Internal Medicine

## 2015-03-21 VITALS — BP 120/80 | HR 102 | Wt 193.0 lb

## 2015-03-21 DIAGNOSIS — G950 Syringomyelia and syringobulbia: Secondary | ICD-10-CM

## 2015-03-21 DIAGNOSIS — G441 Vascular headache, not elsewhere classified: Secondary | ICD-10-CM

## 2015-03-21 DIAGNOSIS — M5440 Lumbago with sciatica, unspecified side: Secondary | ICD-10-CM | POA: Diagnosis not present

## 2015-03-21 MED ORDER — OXYCODONE HCL 15 MG PO TABS: 15.0000 mg | ORAL_TABLET | Freq: Four times a day (QID) | ORAL | Status: DC | PRN

## 2015-03-21 MED ORDER — ONDANSETRON HCL 8 MG PO TABS: ORAL_TABLET | ORAL | Status: DC

## 2015-03-21 MED ORDER — METHYLPREDNISOLONE ACETATE 80 MG/ML IJ SUSP
80.0000 mg | Freq: Once | INTRAMUSCULAR | Status: AC
  Administered 2015-03-21: 80 mg via INTRAMUSCULAR

## 2015-03-21 MED ORDER — KETOCONAZOLE 2 % EX CREA: 1.0000 "application " | TOPICAL_CREAM | Freq: Every day | CUTANEOUS | Status: DC

## 2015-03-21 NOTE — Progress Notes (Signed)
Pre visit review using our clinic review tool, if applicable. No additional management support is needed unless otherwise documented below in the visit note. 

## 2015-03-21 NOTE — Assessment & Plan Note (Signed)
Neurol ref - Dr Brett Fairy Oxycodone Rx

## 2015-03-21 NOTE — Progress Notes (Signed)
   Subjective:    HPI  F/u migraines - worse over past months C/o severe HAs and LBP -worse F/u wt gain of 30 lbs on Atenolol. Pt refused to be weighed - thinks she is 180 lbs... Started wt watchers  F/u syncope, palpitations - resolved on 1/2 dose Topamax, however - c/o more HAs. Zomig nasal is not covered by ins company   The patient is here to follow up on chronic LBP, migraines, anxiety, headaches and chronic moderate paresthesia symptoms controlled with medicines, diet and exercise.  Wt Readings from Last 3 Encounters:  03/21/15 193 lb (87.544 kg)  03/20/15 194 lb (87.998 kg)  12/20/14 200 lb 12 oz (91.06 kg)   BP Readings from Last 3 Encounters:  03/21/15 120/80  03/20/15 132/88  12/20/14 120/80      Review of Systems  Constitutional: Positive for unexpected weight change. Negative for chills, activity change, appetite change and fatigue.  HENT: Negative for congestion, mouth sores and sinus pressure.   Eyes: Negative for visual disturbance.  Respiratory: Negative for cough and chest tightness.   Gastrointestinal: Negative for nausea and abdominal pain.  Genitourinary: Negative for frequency, difficulty urinating and vaginal pain.  Musculoskeletal: Positive for back pain, arthralgias and neck pain. Negative for gait problem.  Skin: Negative for pallor and rash.  Neurological: Positive for numbness. Negative for dizziness, tremors, weakness, light-headedness and headaches.  Psychiatric/Behavioral: Negative for suicidal ideas, confusion, sleep disturbance and decreased concentration. The patient is nervous/anxious.        Objective:   Physical Exam  Constitutional: She appears well-developed. No distress.  HENT:  Head: Normocephalic.  Right Ear: External ear normal.  Left Ear: External ear normal.  Nose: Nose normal.  Mouth/Throat: Oropharynx is clear and moist.  Eyes: Conjunctivae are normal. Pupils are equal, round, and reactive to light. Right eye exhibits no  discharge. Left eye exhibits no discharge.  Neck: Normal range of motion. Neck supple. No JVD present. No tracheal deviation present. No thyromegaly present.  Cardiovascular: Normal rate, regular rhythm and normal heart sounds.   Pulmonary/Chest: No stridor. No respiratory distress. She has no wheezes.  Abdominal: Soft. Bowel sounds are normal. She exhibits no distension and no mass. There is no tenderness. There is no rebound and no guarding.  Musculoskeletal: She exhibits no edema or tenderness.  Lymphadenopathy:    She has no cervical adenopathy.  Neurological: She displays normal reflexes. No cranial nerve deficit. She exhibits normal muscle tone. Coordination normal.  Skin: No rash noted. No erythema.  Psychiatric: She has a normal mood and affect. Her behavior is normal. Judgment and thought content normal.  pigmented patches x3 on epig area  Lab Results  Component Value Date   WBC 6.4 08/23/2014   HGB 12.7 08/23/2014   HCT 38.1 08/23/2014   PLT 243.0 08/23/2014   GLUCOSE 87 08/23/2014   CHOL 213* 12/21/2013   TRIG 67.0 12/21/2013   HDL 67.80 12/21/2013   LDLDIRECT 130.2 12/21/2013   ALT 27 12/21/2013   AST 29 12/21/2013   NA 139 08/23/2014   K 3.9 08/23/2014   CL 108 08/23/2014   CREATININE 0.9 08/23/2014   BUN 16 08/23/2014   CO2 26 08/23/2014   TSH 2.12 12/21/2013   HGBA1C 5.3 02/25/2012        Assessment & Plan:

## 2015-03-21 NOTE — Assessment & Plan Note (Signed)
Neurol ref - Dr Krista Blue

## 2015-03-21 NOTE — Assessment & Plan Note (Addendum)
Neurol ref - Dr Krista Blue Depomedrol 80 mg IM

## 2015-04-04 ENCOUNTER — Encounter: Payer: Self-pay | Admitting: Neurology

## 2015-04-04 ENCOUNTER — Ambulatory Visit (INDEPENDENT_AMBULATORY_CARE_PROVIDER_SITE_OTHER): Payer: 59 | Admitting: Neurology

## 2015-04-04 VITALS — BP 129/85 | HR 82 | Ht 64.0 in | Wt 199.0 lb

## 2015-04-04 DIAGNOSIS — G43009 Migraine without aura, not intractable, without status migrainosus: Secondary | ICD-10-CM

## 2015-04-04 DIAGNOSIS — G950 Syringomyelia and syringobulbia: Secondary | ICD-10-CM | POA: Diagnosis not present

## 2015-04-04 MED ORDER — DICLOFENAC POTASSIUM(MIGRAINE) 50 MG PO PACK: 50.0000 mg | PACK | Freq: Every evening | ORAL | Status: DC | PRN

## 2015-04-04 MED ORDER — NORTRIPTYLINE HCL 10 MG PO CAPS: ORAL_CAPSULE | ORAL | Status: DC

## 2015-04-04 NOTE — Progress Notes (Signed)
PATIENT: Beth Huffman DOB: [birth year]  Chief Complaint  Patient presents with  . Migraine    She has a long history of headaches.  She estimates ten migraines per month despite taking Topamax 200mg , BID.  Sumatriptan is helpful but she always need more than nine tablets per month.      HISTORICAL  Beth Huffman is a 54 years old right-handed female, referred by her primary care physician Dr. Lauraine Rinne evaluation of chronic headaches  I saw her previously in May 2012 for similar complaints, she presented with chronic headaches since early 1990s, extensive imaging study in the past showed evidence of giant system magna, she underwent occipital posterior fossa decompression surgery in [place] in 2005 with some improvement of her headaches.    I have reviewed MRI report in 1994, from [facility] prior to her posterior fossa decompression surgery, MRI of the brain showed a large CSF signal density in the posterior fossa, posterior to the vermis, in the midline.  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 Additional syrinx is present from T3-T12 levels enlarging to maximum diameter at the T4 level measuring 12mm and at the T10 level measuring 28mm. Incidental note made of bilateral perineural cysts at T10 level.  CT cystogram in 2007, following decompression surgery: Again demonstrated is a cystic structure in the posterior fossa. There is a mega cisterna magna present. In addition the is a more focal collection within the posterior fossa. This area completely fills with contrast with no differential filling demonstrated to suggest residual cyst. A small amount of interventricular contrast is demonstrated. There is no hydrocephalus. There is no other intracranial mass or mass-effect.  Postoperative changes of a suboccipital craniotomy are present.   I reviewed previous evaluation, EMG nerve conduction study in 2007, evidence of mild bilateral carpal tunnel syndromes.  2 weeks ago, in May 2016, she had 3 major headaches, she began to have bilateral area severe pounding headaches, lasting for few days, failed to improved by Tylenol, ibuprofen, Zofran, BC powder, repeat dose of Imitrex, eventually presented to urgent care, her headache was improved after receiving Demerol, Phenergan shots, sleeping,  She continues to have headaches 2-3 times each week, lasting few hours to 2 days, bilateral retro-orbital area severe pounding headache with associated light noise sensitivity, nauseous,   Over the years, she has tried different preventive medications, currently still taking Topamax 200 mg twice a day, verapamil 100 mg daily, has not tried Inderal, nortriptyline, questionable benefit from limited trial of Botox in the past,  Trigger for her headaches are weather change, oversleep, stress, For abortive treatment, she has tried Maxalt, Relpax, Zomig  Currently she is taking Imitrex 100 mg 10 tablets each months, zucurity sometimes.  REVIEW OF SYSTEMS: Full 14 system review of systems performed and notable only for : Headaches, mild gait difficulty.  ALLERGIES: Allergies  Allergen Reactions  . Atenolol     Wt gain  . Cefuroxime Axetil     REACTION: nausea - but tolerates PCN  . Codeine Sulfate Nausea Only  . Fentanyl     REACTION: bad reaction  . Hydrocodone-Acetaminophen   . Nitrofurantoin     REACTION: HA  . Pregabalin     REACTION: cp  . Rapaflo [Silodosin]     Orthostatic  BP drop, near syncope   . Tramadol Hcl     REACTION: sick    HOME MEDICATIONS: Current Outpatient Prescriptions  Medication Sig Dispense Refill  . cholecalciferol (VITAMIN D) 1000 UNITS tablet Take 1,000 Units by mouth daily.      Marland Kitchen estradiol (CLIMARA - DOSED IN MG/24 HR) 0.075 mg/24hr patch  Place 0.075 mg onto the skin once a week.    . hydrochlorothiazide (MICROZIDE) 12.5 MG capsule Take 1 capsule (12.5 mg total) by mouth daily. 30 capsule 3  . ibuprofen (ADVIL,MOTRIN) 600 MG tablet Take 600 mg by mouth every 6 (six) hours as needed.    . Multiple Vitamin (MULTIVITAMIN PO) Take 1 tablet by mouth daily.      Marland Kitchen oxyCODONE (ROXICODONE) 15 MG immediate release tablet Take 1 tablet (15 mg total) by mouth every 6 (six) hours as needed for pain. Please fill on or after 05/22/15 120 tablet 0  . pantoprazole (PROTONIX) 40 MG tablet TAKE 1 TABLET DAILY. 30 tablet 5  . promethazine (PHENERGAN) 25 MG tablet Take 25 mg by mouth every 6 (six) hours as needed for nausea or vomiting.    . SUMAtriptan (IMITREX) 100 MG tablet TAKE 1 TABLET EVERY 2 HOURS AS NEEDED FOR MIGRAINE. 9 tablet 5  . topiramate (TOPAMAX) 200 MG tablet Take 1 tablet (200 mg total) by mouth 2 (two) times daily. 60 tablet 5  . verapamil (CALAN-SR) 120 MG CR tablet Take 1 tablet (120 mg total) by mouth at bedtime. 30 tablet 11     PAST MEDICAL HISTORY: Past Medical History  Diagnosis Date  . Syringomyelia   . GERD (gastroesophageal reflux disease)   . LBP (low back pain)   . Migraines   . Pyelonephritis 2008  . UTI (lower urinary tract infection)   . Stress 2009  . Abnormal weight gain   . Syringobulbia   . Hypertension   . Tachycardia     PAST SURGICAL HISTORY: Past Surgical History  Procedure Laterality Date  . Intracranial decompression surgery  2006    FAMILY HISTORY: Family History  Problem Relation Age of Onset  . COPD Mother   . Peripheral vascular disease Mother   . Hypertension Mother   . Heart disease Mother   . Anxiety disorder Other   . Hypertension Father   . Heart attack Maternal Grandmother   . Stroke Maternal Grandfather   . Stroke Paternal Grandfather     SOCIAL HISTORY:  History   Social History  . Marital Status: Married    Spouse Name: N/A  . Number of Children: 2  . Years of  Education: College   Occupational History  . small business owner    Social History Main Topics  . Smoking status: Never Smoker   . Smokeless tobacco: Not on file  . Alcohol Use: No  . Drug Use: No  . Sexual Activity: Yes   Other Topics Concern  . Not on file   Social History Narrative   Lives at home with her husband.   Right-handed.   1 cup caffeine per day.     PHYSICAL EXAM   Filed Vitals:   04/04/15 0927  BP: 129/85  Pulse: 82  Height: 5\' 4"  (1.626 m)  Weight: 199 lb (90.266 kg)    Not recorded      Body mass index is 34.14 kg/(m^2).  PHYSICAL EXAMNIATION:  Gen: NAD, conversant, well nourised, obese, well groomed  Cardiovascular: Regular rate rhythm, no peripheral edema, warm, nontender. Eyes: Conjunctivae clear without exudates or hemorrhage Neck: Supple, no carotid bruise. Pulmonary: Clear to auscultation bilaterally   NEUROLOGICAL EXAM:  MENTAL STATUS: Speech:    Speech is normal; fluent and spontaneous with normal comprehension.  Cognition:    The patient is oriented to person, place, and time;     recent and remote memory intact;     language fluent;     normal attention, concentration,     fund of knowledge.  CRANIAL NERVES: CN II: Visual fields are full to confrontation. Fundoscopic exam is normal with sharp discs and no vascular changes. Pupil are equal round reactive to light  CN III, IV, VI: extraocular movement are normal. No ptosis. CN V: Facial sensation is intact to pinprick in all 3 divisions bilaterally. Corneal responses are intact.  CN VII: Face is symmetric with normal eye closure and smile. CN VIII: Hearing is normal to rubbing fingers CN IX, X: Palate elevates symmetrically. Phonation is normal. CN XI: Head turning and shoulder shrug are intact CN XII: Tongue is midline with normal movements and no atrophy.  MOTOR: There is no pronator drift of out-stretched arms. Muscle bulk and tone are normal. Muscle  strength is normal.  REFLEXES: Reflexes are 2+ and symmetric at the biceps, triceps, knees, and ankles. Plantar responses are flexor.  SENSORY: Light touch, pinprick, position sense, and vibration sense are intact in fingers and toes.  COORDINATION: Rapid alternating movements and fine finger movements are intact. There is no dysmetria on finger-to-nose and heel-knee-shin. There are no abnormal or extraneous movements.   GAIT/STANCE: Posture is normal. Gait is steady with normal steps, base, arm swing, and turning. Heel and toe walking are normal. Tandem gait is normal.  Romberg is absent.   DIAGNOSTIC DATA (LABS, IMAGING, TESTING) - I reviewed patient records, labs, notes, testing and imaging myself where available.  Lab Results  Component Value Date   WBC 6.4 08/23/2014   HGB 12.7 08/23/2014   HCT 38.1 08/23/2014   MCV 92.5 08/23/2014   PLT 243.0 08/23/2014      Component Value Date/Time   NA 139 08/23/2014 1028   K 3.9 08/23/2014 1028   CL 108 08/23/2014 1028   CO2 26 08/23/2014 1028   GLUCOSE 87 08/23/2014 1028   BUN 16 08/23/2014 1028   CREATININE 0.9 08/23/2014 1028   CALCIUM 9.3 08/23/2014 1028   PROT 7.1 12/21/2013 0900   ALBUMIN 4.1 12/21/2013 0900   AST 29 12/21/2013 0900   ALT 27 12/21/2013 0900   ALKPHOS 71 12/21/2013 0900   BILITOT 0.8 12/21/2013 0900   GFRNONAA 85.62 03/06/2010 0838   GFRAA 86 04/11/2008 0901   Lab Results  Component Value Date   CHOL 213* 12/21/2013   HDL 67.80 12/21/2013   LDLDIRECT 130.2 12/21/2013   TRIG 67.0 12/21/2013   CHOLHDL 3 12/21/2013   Lab Results  Component Value Date   HGBA1C 5.3 02/25/2012   Lab Results  Component Value Date   VITAMINB12 699 12/21/2013   Lab Results  Component Value Date   TSH 2.12 12/21/2013    ASSESSMENT AND PLAN  Beth Huffman is a 54 y.o. female With history of trying to system magna, cervical, upper thoracic syrinx, presenting with frequent headaches, with migraine features,  likely a component of medicine rebound with frequent over-the-counter analgesic use, and frequent triptan use.  1, continue preventive medications Topamax 200 mg daily, verapamil 120 mg daily 2. Add on  nortriptyline, titrating to 10 mg 2 tablets daily 3. Continue Imitrex, Cambian as needed    4. RTC in one month   Marcial Pacas, M.D. Ph.D.  [facility] [address] [place], [place] [address] Ph: [phone] Fax: [phone]

## 2015-04-16 ENCOUNTER — Telehealth: Payer: Self-pay | Admitting: Neurology

## 2015-04-16 MED ORDER — NAPROXEN 500 MG PO TABS: 500.0000 mg | ORAL_TABLET | Freq: Two times a day (BID) | ORAL | Status: DC | PRN

## 2015-04-16 NOTE — Telephone Encounter (Signed)
I called the patient back.  Relayed providers message.  She verbalized understanding and is agreeable to this.  I called the pharmacy.  Spoke with [employer].  Explained Naproxen will replace Cambia due to cost.  They will note file.

## 2015-04-16 NOTE — Telephone Encounter (Signed)
Spoke to [employer] requires PA - she is aware that this process can take several days.

## 2015-04-16 NOTE — Telephone Encounter (Signed)
I contacted ins regarding Cambia.  There is an approval in place, and this drug is covered under her plan Ref # [id number].  I called the pharmacy.  They said the patient asked them to just save Rx on file, and not fill it when they received it earlier this month.  They processed the claim while I was on the phone and said even with ins Cathren Harsh will be $160 co-pay per month.  If patient uses the discount voucher, maximum benefit allowed is $90, so her co-pay would still be $70 per fill.  Patient is unable to afford this amount.  She would like to see if a different medication could be prescribed instead.  Please advise.  Thank you.

## 2015-04-16 NOTE — Telephone Encounter (Signed)
I have called in naproxen 500 mg as needed, she may take it together with her Imitrex tablets as needed

## 2015-04-16 NOTE — Telephone Encounter (Signed)
Patient called and requested to speak with the nurse regarding the medication (she does not know the name of the medication). She states the she could not have it filled because of the price and would like to know if Dr. Krista Blue can recommend something else. Please call and advise. (Asked her to call pharmacy and find out name of medication so she will know when Sharyn Lull RN returns her call, she agreed).

## 2015-05-23 ENCOUNTER — Telehealth: Payer: Self-pay | Admitting: Neurology

## 2015-05-23 ENCOUNTER — Ambulatory Visit (INDEPENDENT_AMBULATORY_CARE_PROVIDER_SITE_OTHER): Payer: 59 | Admitting: Neurology

## 2015-05-23 ENCOUNTER — Encounter: Payer: Self-pay | Admitting: Neurology

## 2015-05-23 VITALS — BP 122/79 | HR 96 | Ht 64.0 in | Wt 200.0 lb

## 2015-05-23 DIAGNOSIS — G950 Syringomyelia and syringobulbia: Secondary | ICD-10-CM

## 2015-05-23 DIAGNOSIS — G43009 Migraine without aura, not intractable, without status migrainosus: Secondary | ICD-10-CM | POA: Diagnosis not present

## 2015-05-23 MED ORDER — NORTRIPTYLINE HCL 25 MG PO CAPS
ORAL_CAPSULE | ORAL | Status: DC
Start: 2015-05-23 — End: 2015-06-27

## 2015-05-23 MED ORDER — SUMATRIPTAN SUCCINATE 100 MG PO TABS: 100.0000 mg | ORAL_TABLET | ORAL | Status: DC | PRN

## 2015-05-23 NOTE — Telephone Encounter (Signed)
Rx has been sent.  I called back to advise.  Got no answer.  Left message.   

## 2015-05-23 NOTE — Progress Notes (Signed)
Chief Complaint  Patient presents with  . Migraine    Her headache frequency has reduced 50% since adding nortriptyline and the severity has lessened.  Cambia was cost-prohibitive and she is only using sumatriptan for her rescue medication.  She has had problems with vertigo in the past.  For the last couple of weeks, she has been waking up dizzy and has ringing in her left ear.      PATIENT: Beth Huffman DOB: Aug 02, 1961  Chief Complaint  Patient presents with  . Migraine    Her headache frequency has reduced 50% since adding nortriptyline and the severity has lessened.  Cambia was cost-prohibitive and she is only using sumatriptan for her rescue medication.  She has had problems with vertigo in the past.  For the last couple of weeks, she has been waking up dizzy and has ringing in her left ear.    HISTORICAL  Beth Huffman is a 54 years old right-handed female, referred by her primary care physician Dr. Lauraine Rinne evaluation of chronic headaches  I saw her previously in May 2012 for similar complaints, she presented with chronic headaches since early 1990s, extensive imaging study in the past showed evidence of giant cistern magna, she underwent occipital posterior fossa decompression surgery in [place] in 2005 with some improvement of her headaches.    I have reviewed MRI report in 1994, from [facility] system prior to her posterior fossa decompression surgery, showed a large CSF signal density in the posterior fossa, posterior to the vermis, in the midline.  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 Additional syrinx is present from T3-T12 levels enlarging to maximum diameter at the T4 level measuring 47mm and at the T10 level measuring 42mm. Incidental note made of bilateral  perineural cysts at T10 level.  CT cystogram in 2007, following decompression surgery: Again demonstrated is a cystic structure in the posterior fossa. There is a mega cisterna magna present. In addition the is a more focal collection within the posterior fossa. This area completely fills with contrast with no differential filling demonstrated to suggest residual cyst. A small amount of interventricular contrast is demonstrated. There is no hydrocephalus. There is no other intracranial mass or mass-effect. Postoperative changes of a suboccipital craniotomy are present.    EMG nerve conduction study in 2007, evidence of mild bilateral carpal tunnel syndromes.  Since May 2016, she had more severe, frequent headaches, bilateral area severe pounding headaches, lasting for few days, failed to improved by Tylenol, ibuprofen, Zofran, BC powder, repeat dose of Imitrex, eventually presented to urgent care, her headache was improved after receiving Demerol, Phenergan shots, sleeping,  She continues to have headaches 2-3 times each week, lasting few hours to 2 days, bilateral retro-orbital area severe pounding headache with associated light noise sensitivity, nauseous,   Over the years, she has tried different preventive medications, currently still taking Topamax 200 mg twice a day, verapamil 120 mg daily, has not tried Inderal, nortriptyline, questionable benefit from limited trial of Botox in the past,  Trigger for her headaches are weather change, oversleep, stress, For abortive treatment, she has tried Maxalt, Relpax, Zomig  Currently she is taking Imitrex 100 mg 10 tablets each months, zucurity sometimes.  UPDATE July 27th 2016: Her headache has much improved, she has average  4-5 severe headaches each months, Imitrex half to one tablets as needed, plus naproxen 500 mg as needed has been helpful, her insurance does not cover Cambia   She is overall happy about current progress, will stay on Topamax  200 mg twice a day, verapamil 120 mg a day, and nortriptyline, she complains mild dizziness, left ear reining recently, no hearing loss  REVIEW OF SYSTEMS: Full 14 system review of systems performed and notable only for : Headaches, mild gait difficulty.  ALLERGIES: Allergies  Allergen Reactions  . Atenolol     Wt gain  . Cefuroxime Axetil     REACTION: nausea - but tolerates PCN  . Codeine Sulfate Nausea Only  . Fentanyl     REACTION: bad reaction  . Hydrocodone-Acetaminophen   . Nitrofurantoin     REACTION: HA  . Pregabalin     REACTION: cp  . Rapaflo [Silodosin]     Orthostatic BP drop, near syncope   . Tramadol Hcl     REACTION: sick    HOME MEDICATIONS: Current Outpatient Prescriptions  Medication Sig Dispense Refill  . cholecalciferol (VITAMIN D) 1000 UNITS tablet Take 1,000 Units by mouth daily.      Marland Kitchen estradiol (CLIMARA - DOSED IN MG/24 HR) 0.075 mg/24hr patch Place 0.075 mg onto the skin once a week.    . hydrochlorothiazide (MICROZIDE) 12.5 MG capsule Take 1 capsule (12.5 mg total) by mouth daily. 30 capsule 3  . ibuprofen (ADVIL,MOTRIN) 600 MG tablet Take 600 mg by mouth every 6 (six) hours as needed.    . Multiple Vitamin (MULTIVITAMIN PO) Take 1 tablet by mouth daily.      Marland Kitchen oxyCODONE (ROXICODONE) 15 MG immediate release tablet Take 1 tablet (15 mg total) by mouth every 6 (six) hours as needed for pain. Please fill on or after 05/22/15 120 tablet 0  . pantoprazole (PROTONIX) 40 MG tablet TAKE 1 TABLET DAILY. 30 tablet 5  . promethazine (PHENERGAN) 25 MG tablet Take 25 mg by mouth every 6 (six) hours as needed for nausea or vomiting.    . SUMAtriptan (IMITREX) 100 MG tablet TAKE 1 TABLET EVERY 2 HOURS AS NEEDED FOR MIGRAINE. 9 tablet 5  . topiramate (TOPAMAX) 200 MG tablet Take 1 tablet (200 mg total) by mouth 2 (two) times daily. 60 tablet 5  . verapamil (CALAN-SR) 120 MG CR tablet Take 1 tablet (120 mg total) by mouth at bedtime. 30 tablet 11     PAST MEDICAL  HISTORY: Past Medical History  Diagnosis Date  . Syringomyelia   . GERD (gastroesophageal reflux disease)   . LBP (low back pain)   . Migraines   . Pyelonephritis 2008  . UTI (lower urinary tract infection)   . Stress 2009  . Abnormal weight gain   . Syringobulbia   . Hypertension   . Tachycardia     PAST SURGICAL HISTORY: Past Surgical History  Procedure Laterality Date  . Intracranial decompression surgery  2006    FAMILY HISTORY: Family History  Problem Relation Age of Onset  . COPD Mother   . Peripheral vascular disease Mother   . Hypertension Mother   . Heart disease Mother   . Anxiety disorder Other   . Hypertension Father   . Heart attack Maternal Grandmother   . Stroke Maternal Grandfather   . Stroke Paternal Grandfather     SOCIAL HISTORY:  History   Social History  . Marital Status: Married    Spouse Name: N/A  . Number  of Children: 2  . Years of Education: College   Occupational History  . small business owner    Social History Main Topics  . Smoking status: Never Smoker   . Smokeless tobacco: Not on file  . Alcohol Use: No  . Drug Use: No  . Sexual Activity: Yes   Other Topics Concern  . Not on file   Social History Narrative   Lives at home with her husband.   Right-handed.   1 cup caffeine per day.     PHYSICAL EXAM   Filed Vitals:   05/23/15 0925  BP: 122/79  Pulse: 96  Height: 5\' 4"  (1.626 m)  Weight: 200 lb (90.719 kg)    Not recorded      Body mass index is 34.31 kg/(m^2).  PHYSICAL EXAMNIATION:  Gen: NAD, conversant, well nourised, obese, well groomed                     Cardiovascular: Regular rate rhythm, no peripheral edema, warm, nontender. Eyes: Conjunctivae clear without exudates or hemorrhage Neck: Supple, no carotid bruise. Pulmonary: Clear to auscultation bilaterally   NEUROLOGICAL EXAM:  MENTAL STATUS: Speech:    Speech is normal; fluent and spontaneous with normal comprehension.  Cognition:     The patient is oriented to person, place, and time;     recent and remote memory intact;     language fluent;     normal attention, concentration,     fund of knowledge.  CRANIAL NERVES: CN II: Visual fields are full to confrontation. Fundoscopic exam is normal with sharp discs and no vascular changes. Pupil are equal round reactive to light  CN III, IV, VI: extraocular movement are normal. No ptosis. CN V: Facial sensation is intact to pinprick in all 3 divisions bilaterally. Corneal responses are intact.  CN VII: Face is symmetric with normal eye closure and smile. CN VIII: Hearing is normal to rubbing fingers CN IX, X: Palate elevates symmetrically. Phonation is normal. CN XI: Head turning and shoulder shrug are intact CN XII: Tongue is midline with normal movements and no atrophy.  MOTOR: There is no pronator drift of out-stretched arms. Muscle bulk and tone are normal. Muscle strength is normal.  REFLEXES: Reflexes are 2+ and symmetric at the biceps, triceps, knees, and ankles. Plantar responses are flexor.  SENSORY: Light touch, pinprick, position sense, and vibration sense are intact in fingers and toes.  COORDINATION: Rapid alternating movements and fine finger movements are intact. There is no dysmetria on finger-to-nose and heel-knee-shin. There are no abnormal or extraneous movements.   GAIT/STANCE: Posture is normal. Gait is steady with normal steps, base, arm swing, and turning. Heel and toe walking are normal. Tandem gait is normal.  Romberg is absent.   DIAGNOSTIC DATA (LABS, IMAGING, TESTING) - I reviewed patient records, labs, notes, testing and imaging myself where available.  Lab Results  Component Value Date   WBC 6.4 08/23/2014   HGB 12.7 08/23/2014   HCT 38.1 08/23/2014   MCV 92.5 08/23/2014   PLT 243.0 08/23/2014      Component Value Date/Time   NA 139 08/23/2014 1028   K 3.9 08/23/2014 1028   CL 108 08/23/2014 1028   CO2 26 08/23/2014 1028    GLUCOSE 87 08/23/2014 1028   BUN 16 08/23/2014 1028   CREATININE 0.9 08/23/2014 1028   CALCIUM 9.3 08/23/2014 1028   PROT 7.1 12/21/2013 0900   ALBUMIN 4.1 12/21/2013 0900   AST 29 12/21/2013  0900   ALT 27 12/21/2013 0900   ALKPHOS 71 12/21/2013 0900   BILITOT 0.8 12/21/2013 0900   GFRNONAA 85.62 03/06/2010 0838   GFRAA 86 04/11/2008 0901   Lab Results  Component Value Date   CHOL 213* 12/21/2013   HDL 67.80 12/21/2013   LDLDIRECT 130.2 12/21/2013   TRIG 67.0 12/21/2013   CHOLHDL 3 12/21/2013   Lab Results  Component Value Date   HGBA1C 5.3 02/25/2012   Lab Results  Component Value Date   VVOHYWVP71 062 12/21/2013   Lab Results  Component Value Date   TSH 2.12 12/21/2013    ASSESSMENT AND PLAN  ELIZBETH POSA is a 54 y.o. female with history of Giant Cistern magna, cervical, upper thoracic syrinx  1. Chronic migraine, has much improved with current preventive medications, keep Topamax 200 mg twice a day, verapamil 120 daily, titrating nortriptyline up to 25 mg 2 tablets every night 2.  Imitrex, naproxen 500 mg as needed 3. Return to clinic with nurse practitioner in 3 months  Marcial Pacas, M.D. Ph.D.  [facility] [address] [place], [place] [address] Ph: [phone] Fax: [phone]

## 2015-05-23 NOTE — Telephone Encounter (Signed)
PT need refill on SUMAtriptan (IMITREX) 100 MG tablet. May call pt at [phone]

## 2015-05-23 NOTE — Telephone Encounter (Signed)
Please send Rx to [facility], [address]. Thank you

## 2015-06-27 ENCOUNTER — Ambulatory Visit (INDEPENDENT_AMBULATORY_CARE_PROVIDER_SITE_OTHER): Payer: 59 | Admitting: Internal Medicine

## 2015-06-27 ENCOUNTER — Encounter: Payer: Self-pay | Admitting: Internal Medicine

## 2015-06-27 VITALS — BP 112/78 | HR 99 | Wt 195.0 lb

## 2015-06-27 DIAGNOSIS — R748 Abnormal levels of other serum enzymes: Secondary | ICD-10-CM

## 2015-06-27 DIAGNOSIS — M5440 Lumbago with sciatica, unspecified side: Secondary | ICD-10-CM | POA: Diagnosis not present

## 2015-06-27 DIAGNOSIS — G950 Syringomyelia and syringobulbia: Secondary | ICD-10-CM

## 2015-06-27 DIAGNOSIS — R739 Hyperglycemia, unspecified: Secondary | ICD-10-CM

## 2015-06-27 DIAGNOSIS — Z23 Encounter for immunization: Secondary | ICD-10-CM | POA: Diagnosis not present

## 2015-06-27 DIAGNOSIS — R635 Abnormal weight gain: Secondary | ICD-10-CM | POA: Diagnosis not present

## 2015-06-27 MED ORDER — OXYCODONE HCL 15 MG PO TABS: 15.0000 mg | ORAL_TABLET | Freq: Four times a day (QID) | ORAL | Status: DC | PRN

## 2015-06-27 NOTE — Assessment & Plan Note (Signed)
Labs

## 2015-06-27 NOTE — Assessment & Plan Note (Signed)
Chronic pain 

## 2015-06-27 NOTE — Assessment & Plan Note (Signed)
Chronic  On Oxycodone  Potential benefits of a long term opioids use as well as potential risks (i.e. addiction risk, apnea etc) and complications (i.e. Somnolence, constipation and others) were explained to the patient and were aknowledged. 

## 2015-06-27 NOTE — Assessment & Plan Note (Signed)
Wt Readings from Last 3 Encounters:  06/27/15 195 lb (88.451 kg)  05/23/15 200 lb (90.719 kg)  04/04/15 199 lb (90.266 kg)

## 2015-06-27 NOTE — Assessment & Plan Note (Signed)
Monitoring labs 

## 2015-06-27 NOTE — Progress Notes (Signed)
Subjective:  Patient ID: Asa Saunas, female    DOB: Nov 03, 1960  Age: 54 y.o. MRN: [id number]  CC: No chief complaint on file.   HPI VERLINDA SLOTNICK presents for chronic LBP, migraines, anxiety, headaches and chronic moderate paresthesia symptoms f/u. Pt saw Dr Krista Blue: Topamax was increased, Nortript was started, Naproxen prn. HAs are 50% better.Marland KitchenMarland KitchenDtr moved in w/family  Outpatient Prescriptions Prior to Visit  Medication Sig Dispense Refill  . cholecalciferol (VITAMIN D) 1000 UNITS tablet Take 1,000 Units by mouth daily.      Marland Kitchen estradiol (CLIMARA - DOSED IN MG/24 HR) 0.075 mg/24hr patch Place 0.075 mg onto the skin once a week.    Marland Kitchen ibuprofen (ADVIL,MOTRIN) 600 MG tablet Take 600 mg by mouth every 6 (six) hours as needed.    . Multiple Vitamin (MULTIVITAMIN PO) Take 1 tablet by mouth daily.      . naproxen (NAPROSYN) 500 MG tablet Take 1 tablet (500 mg total) by mouth every 12 (twelve) hours as needed. 60 tablet 6  . Ondansetron HCl (ZOFRAN PO) Take by mouth.    . oxyCODONE (ROXICODONE) 15 MG immediate release tablet Take 1 tablet (15 mg total) by mouth every 6 (six) hours as needed for pain. Please fill on or after 05/22/15 120 tablet 0  . pantoprazole (PROTONIX) 40 MG tablet TAKE 1 TABLET DAILY. 30 tablet 5  . SUMAtriptan (IMITREX) 100 MG tablet Take 1 tablet (100 mg total) by mouth as needed for migraine (May repeat once in 2 hours if needed. Max 2 tabs in 24 hours). 9 tablet 6  . topiramate (TOPAMAX) 200 MG tablet Take 1 tablet (200 mg total) by mouth 2 (two) times daily. 60 tablet 5  . nortriptyline (PAMELOR) 25 MG capsule 1 tablet every night for one week, then 2 tablets every night 60 capsule 11  . hydrochlorothiazide (MICROZIDE) 12.5 MG capsule Take 1 capsule (12.5 mg total) by mouth daily. (Patient not taking: Reported on 06/27/2015) 30 capsule 3  . verapamil (CALAN-SR) 120 MG CR tablet Take 1 tablet (120 mg total) by mouth at bedtime. (Patient not taking: Reported on 06/27/2015)  30 tablet 11   No facility-administered medications prior to visit.    ROS Review of Systems  Constitutional: Negative for chills, activity change, appetite change, fatigue and unexpected weight change.  HENT: Negative for congestion, mouth sores and sinus pressure.   Eyes: Negative for visual disturbance.  Respiratory: Negative for cough and chest tightness.   Gastrointestinal: Negative for nausea and abdominal pain.  Genitourinary: Negative for frequency, difficulty urinating and vaginal pain.  Musculoskeletal: Positive for back pain and neck pain. Negative for gait problem.  Skin: Negative for pallor and rash.  Neurological: Positive for numbness and headaches. Negative for dizziness, tremors and weakness.  Psychiatric/Behavioral: Negative for suicidal ideas, confusion and sleep disturbance. The patient is nervous/anxious.     Objective:  BP 112/78 mmHg  Pulse 99  Wt 195 lb (88.451 kg)  SpO2 97%  BP Readings from Last 3 Encounters:  06/27/15 112/78  05/23/15 122/79  04/04/15 129/85    Wt Readings from Last 3 Encounters:  06/27/15 195 lb (88.451 kg)  05/23/15 200 lb (90.719 kg)  04/04/15 199 lb (90.266 kg)    Physical Exam  Constitutional: She appears well-developed. No distress.  HENT:  Head: Normocephalic.  Right Ear: External ear normal.  Left Ear: External ear normal.  Nose: Nose normal.  Mouth/Throat: Oropharynx is clear and moist.  Eyes: Conjunctivae are normal. Pupils are  equal, round, and reactive to light. Right eye exhibits no discharge. Left eye exhibits no discharge.  Neck: Normal range of motion. Neck supple. No JVD present. No tracheal deviation present. No thyromegaly present.  Cardiovascular: Normal rate, regular rhythm and normal heart sounds.   Pulmonary/Chest: No stridor. No respiratory distress. She has no wheezes.  Abdominal: Soft. Bowel sounds are normal. She exhibits no distension and no mass. There is tenderness. There is no rebound and no  guarding.  Musculoskeletal: She exhibits tenderness. She exhibits no edema.  Lymphadenopathy:    She has no cervical adenopathy.  Neurological: She displays normal reflexes. No cranial nerve deficit. She exhibits normal muscle tone. Coordination normal.  Skin: No rash noted. No erythema.  Psychiatric: She has a normal mood and affect. Her behavior is normal. Judgment and thought content normal.    Lab Results  Component Value Date   WBC 6.4 08/23/2014   HGB 12.7 08/23/2014   HCT 38.1 08/23/2014   PLT 243.0 08/23/2014   GLUCOSE 87 08/23/2014   CHOL 213* 12/21/2013   TRIG 67.0 12/21/2013   HDL 67.80 12/21/2013   LDLDIRECT 130.2 12/21/2013   ALT 27 12/21/2013   AST 29 12/21/2013   NA 139 08/23/2014   K 3.9 08/23/2014   CL 108 08/23/2014   CREATININE 0.9 08/23/2014   BUN 16 08/23/2014   CO2 26 08/23/2014   TSH 2.12 12/21/2013   HGBA1C 5.3 02/25/2012    Mm Digital Screening  03/25/2013   *RADIOLOGY REPORT*  Clinical Data: Screening.  DIGITAL BILATERAL SCREENING MAMMOGRAM WITH CAD DIGITAL BREAST TOMOSYNTHESIS  Digital breast tomosynthesis images are acquired in two projections.  These images are reviewed in combination with the digital mammogram, confirming the findings below.  Comparison:  Previous exams.  FINDINGS:  ACR Breast Density Category 2: There is a scattered fibroglandular pattern.  There are no findings suspicious for malignancy.  Images were processed with CAD.  IMPRESSION: No mammographic evidence of malignancy.  A result letter of this screening mammogram will be mailed directly to the patient.  RECOMMENDATION: Screening mammogram in one year. (Code:[id number]-B-01Y)  BI-RADS CATEGORY 1:  Negative.   Original Report Authenticated By: Conchita Paris, M.D.    Assessment & Plan:   There are no diagnoses linked to this encounter. I am having Ms. Mark maintain her cholecalciferol, Multiple Vitamin (MULTIVITAMIN PO), verapamil, pantoprazole, hydrochlorothiazide, topiramate,  oxyCODONE, estradiol, ibuprofen, naproxen, Ondansetron HCl (ZOFRAN PO), SUMAtriptan, nortriptyline, and cyclobenzaprine.  Meds ordered this encounter  Medications  . nortriptyline (PAMELOR) 25 MG capsule    Sig: Take 2 capsules by mouth at bedtime.  . cyclobenzaprine (FLEXERIL) 5 MG tablet    Sig: TK 1 T PO TID PRF MUSCLE SPASM    Refill:  1     Follow-up: No Follow-up on file.  Walker Kehr, MD

## 2015-06-27 NOTE — Progress Notes (Signed)
Pre visit review using our clinic review tool, if applicable. No additional management support is needed unless otherwise documented below in the visit note. 

## 2015-07-05 ENCOUNTER — Other Ambulatory Visit: Payer: Self-pay | Admitting: Internal Medicine

## 2015-07-13 ENCOUNTER — Other Ambulatory Visit: Payer: Self-pay | Admitting: Neurology

## 2015-07-14 ENCOUNTER — Other Ambulatory Visit: Payer: Self-pay | Admitting: Internal Medicine

## 2015-08-22 ENCOUNTER — Encounter: Payer: Self-pay | Admitting: Nurse Practitioner

## 2015-08-22 ENCOUNTER — Ambulatory Visit (INDEPENDENT_AMBULATORY_CARE_PROVIDER_SITE_OTHER): Payer: 59 | Admitting: Nurse Practitioner

## 2015-08-22 VITALS — BP 138/92 | HR 96 | Ht 65.0 in | Wt 183.6 lb

## 2015-08-22 DIAGNOSIS — G43119 Migraine with aura, intractable, without status migrainosus: Secondary | ICD-10-CM

## 2015-08-22 DIAGNOSIS — G950 Syringomyelia and syringobulbia: Secondary | ICD-10-CM | POA: Diagnosis not present

## 2015-08-22 NOTE — Progress Notes (Signed)
[facility]  PATIENT: Beth Huffman DOB: 1961/06/14   REASON FOR VISIT: Follow-up for migraine, ringing in the ear, vertigo HISTORY FROM: Patient    HISTORY OF PRESENT ILLNESS:Beth Huffman is a 54 years old right-handed female, referred by her primary care physician Dr. Lauraine Rinne evaluation of chronic headaches I saw her previously in May 2012 for similar complaints, she presented with chronic headaches since early 1990s, extensive imaging study in the past showed evidence of giant cistern magna, she underwent occipital posterior fossa decompression surgery in [place] in 2005 with some improvement of her headaches.  I have reviewed MRI report in 1994, from [facility] prior to her posterior fossa decompression surgery, showed a large CSF signal density in the posterior fossa, posterior to the vermis, in the midline. 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 Additional syrinx is present from T3-T12 levels enlarging to maximum diameter at the T4 level measuring 63mm and at the T10 level measuring 3mm. Incidental note made of bilateral perineural cysts at T10 level.  CT cystogram in 2007, following decompression surgery: Again demonstrated is a cystic structure in the posterior fossa. There is a mega cisterna magna present. In addition the is a more focal collection within the posterior fossa. This area completely fills with contrast with no differential filling demonstrated to suggest residual cyst. A small amount of interventricular contrast is demonstrated. There is no hydrocephalus. There is no other intracranial mass or mass-effect. Postoperative changes of a suboccipital craniotomy are present.  EMG nerve conduction study in 2007, evidence of mild bilateral  carpal tunnel syndromes.  Since May 2016, she had more severe, frequent headaches, bilateral area severe pounding headaches, lasting for few days, failed to improved by Tylenol, ibuprofen, Zofran, BC powder, repeat dose of Imitrex, eventually presented to urgent care, her headache was improved after receiving Demerol, Phenergan shots, sleeping, She continues to have headaches 2-3 times each week, lasting few hours to 2 days, bilateral retro-orbital area severe pounding headache with associated light noise sensitivity, nauseous,  Over the years, she has tried different preventive medications, currently still taking Topamax 200 mg twice a day, verapamil 120 mg daily, has not tried Inderal, nortriptyline, questionable benefit from limited trial of Botox in the past, Trigger for her headaches are weather change, oversleep, stress, For abortive treatment, she has tried Maxalt, Relpax, Zomig Currently she is taking Imitrex 100 mg 10 tablets each months, zucurity sometimes.  UPDATE July 27th 2016  Her headache has much improved, she has average 4-5 severe headaches each months, Imitrex half to one tablets as needed, plus naproxen 500 mg as needed has been helpful, her insurance does not cover [place]  She is overall happy about current progress, will stay on Topamax 200 mg twice a day, verapamil 120 mg a day, and nortriptyline, she complains mild dizziness, left ear reining recently, no hearing loss UPDATE 08/22/2015 Beth Huffman, 53 year old female returns for follow-up. She was last seen in the office by Dr. 05/23/2015. She has had a total of 10 headaches in the last month but not all of them were severe. Most of them occur after she has had a long day at work and she has been doing lifting. 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 She has a  mild headache today. She is still overall pleased with her current progress and does not wish to have any additional medications added. Imitrex  works acutely. She returns for reevaluation  REVIEW OF SYSTEMS: Full 14 system review of systems performed and notable only for those listed, all others are neg:  Constitutional: neg  Cardiovascular: neg Ear/Nose/Throat: neg  Skin: neg Eyes: neg Respiratory: neg Gastroitestinal: neg  Hematology/Lymphatic: neg  Endocrine: neg Musculoskeletal:neg Allergy/Immunology: neg Neurological: neg Psychiatric: neg Sleep : neg   ALLERGIES: Allergies  Allergen Reactions  . Atenolol     Wt gain  . Cefuroxime Axetil     REACTION: nausea - but tolerates PCN  . Codeine Sulfate Nausea Only  . Fentanyl     REACTION: bad reaction  . Hydrocodone-Acetaminophen   . Nitrofurantoin     REACTION: HA  . Pregabalin     REACTION: cp  . Rapaflo [Silodosin]     Orthostatic BP drop, near syncope   . Tramadol Hcl     REACTION: sick    HOME MEDICATIONS: Outpatient Prescriptions Prior to Visit  Medication Sig Dispense Refill  . cholecalciferol (VITAMIN D) 1000 UNITS tablet Take 1,000 Units by mouth daily.      . cyclobenzaprine (FLEXERIL) 5 MG tablet TK 1 T PO TID PRF MUSCLE SPASM  1  . estradiol (CLIMARA - DOSED IN MG/24 HR) 0.075 mg/24hr patch Place 0.075 mg onto the skin once a week.    . hydrochlorothiazide (MICROZIDE) 12.5 MG capsule Take 1 capsule (12.5 mg total) by mouth daily. 30 capsule 3  . Multiple Vitamin (MULTIVITAMIN PO) Take 1 tablet by mouth daily.      . naproxen (NAPROSYN) 500 MG tablet TAKE 1 TABLET EVERY TWELVE HOURS AS NEEDED. 60 tablet 6  . nortriptyline (PAMELOR) 25 MG capsule Take 1 capsule by mouth at bedtime.     . Ondansetron HCl (ZOFRAN PO) Take by mouth.    . oxyCODONE (ROXICODONE) 15 MG immediate release tablet Take 1 tablet (15 mg total) by mouth every 6 (six) hours as needed for pain. Please fill on or after 09/16/15 120 tablet 0  . pantoprazole (PROTONIX) 40 MG tablet TAKE 1 TABLET DAILY. 30 tablet 5  . SUMAtriptan (IMITREX) 100 MG tablet Take 1 tablet (100 mg total)  by mouth as needed for migraine (May repeat once in 2 hours if needed. Max 2 tabs in 24 hours). 9 tablet 6  . topiramate (TOPAMAX) 200 MG tablet TAKE 1 TABLET TWICE DAILY. 60 tablet 11  . verapamil (CALAN-SR) 120 MG CR tablet TAKE ONE TABLET AT BEDTIME. 30 tablet 11  . ibuprofen (ADVIL,MOTRIN) 600 MG tablet TAKE 1 TABLET 3 TIMES A DAY. (Patient not taking: Reported on 08/22/2015) 90 tablet 1   No facility-administered medications prior to visit.    PAST MEDICAL HISTORY: Past Medical History  Diagnosis Date  . Syringomyelia ([place])   . GERD (gastroesophageal reflux disease)   . LBP (low back pain)   . Migraines   . Pyelonephritis 2008  . UTI (lower urinary tract infection)   . Stress 2009  . Abnormal weight gain   . Syringobulbia ([place])   . Hypertension   . Tachycardia     PAST SURGICAL HISTORY: Past Surgical History  Procedure Laterality Date  . Intracranial decompression surgery  2006    FAMILY HISTORY: Family History  Problem Relation Age of Onset  . COPD Mother   . Peripheral vascular disease Mother   . Hypertension Mother   . Heart  disease Mother   . Anxiety disorder Other   . Hypertension Father   . Heart attack Maternal Grandmother   . Stroke Maternal Grandfather   . Stroke Paternal Grandfather     SOCIAL HISTORY: Social History   Social History  . Marital Status: Married    Spouse Name: N/A  . Number of Children: 2  . Years of Education: College   Occupational History  . small business owner    Social History Main Topics  . Smoking status: Never Smoker   . Smokeless tobacco: Not on file  . Alcohol Use: No  . Drug Use: No  . Sexual Activity: Yes   Other Topics Concern  . Not on file   Social History Narrative   Lives at home with her husband.   Right-handed.   1 cup caffeine per day.     PHYSICAL EXAM  Filed Vitals:   08/22/15 0926  BP: 138/92  Pulse: 96  Height: 5\' 5"  (1.651 m)  Weight: 183 lb 9.6 oz (83.28 kg)   Body mass index is  30.55 kg/(m^2).  Generalized: Well developed,  obese female in no acute distress  Head: normocephalic and atraumatic,. Oropharynx benign  Neck: Supple, no carotid bruits  Cardiac: Regular rate rhythm, no murmur  Musculoskeletal: No deformity   Neurological examination   Mentation: Alert oriented to time, place, history taking. Attention span and concentration appropriate. Recent and remote memory intact.  Follows all commands speech and language fluent.   Cranial nerve II-XII: Fundoscopic exam reveals sharp disc margins.Pupils were equal round reactive to light extraocular movements were full, visual field were full on confrontational test. Facial sensation and strength were normal. hearing was intact to finger rubbing bilaterally. Uvula tongue midline. head turning and shoulder shrug were normal and symmetric.Tongue protrusion into cheek strength was normal. Motor: normal bulk and tone, full strength in the BUE, BLE, fine finger movements normal, no pronator drift. No focal weakness Sensory: normal and symmetric to light touch, pinprick, and  Vibration, proprioception  Coordination: finger-nose-finger, heel-to-shin bilaterally, no dysmetria Reflexes: Brachioradialis 2/2, biceps 2/2, triceps 2/2, patellar 2/2, Achilles 2/2, plantar responses were flexor bilaterally. Gait and Station: Rising up from seated position without assistance, normal stance,  moderate stride, good arm swing, smooth turning, able to perform tiptoe, and heel walking without difficulty. Tandem gait is steady  DIAGNOSTIC DATA (LABS, IMAGING, TESTING) -  ASSESSMENT AND PLAN  54 y.o. year old female  has a past medical history of Syringomyelia ([place]); ; LBP (low back pain); Migraines; ; Syringobulbia ([place]); Hypertension; and Tachycardia. here to follow-up. Her headaches are in fairly good control and she does not wish to add any additional medications at this time  Continue current preventive medications, Topamax 200 mg  twice a day,  Verapamil 120 daily,  Nortriptyline  25 mg  every night Imitrex 100mg  prn  Naproxen 500 mg as needed I spent additional 5 minutes in total face to face time with the patient more than 50% of which was spent counseling and coordination of care, reviewing test results reviewing medications and discussing and reviewing the diagnosis of migraine and further treatment options. Importance of keeping a diary if headaches worsen to include the time of the headache what you're doing any other specific information that would be useful. Discussed stress relief techniques such as deep breathing muscle relaxation mental relaxation to music. Discussed importance of exercise, regular meals  and sleep. Sleep deprivation can be a migraine trigger Follow up in 6 months  Dennie Bible, [facility], [facility], APRN  [facility] [address] [place], [place] [address] [phone]

## 2015-08-22 NOTE — Patient Instructions (Signed)
Continue current preventive medications, Topamax 200 mg twice a day,  Verapamil 120 daily,  Nortriptyline up to 25 mg  every night Imitrex 100mg  prn  Naproxen 500 mg as needed Follow up in 6 months

## 2015-08-27 NOTE — Progress Notes (Signed)
I have reviewed and agreed above plan. 

## 2015-08-29 ENCOUNTER — Telehealth: Payer: Self-pay | Admitting: *Deleted

## 2015-08-29 DIAGNOSIS — R55 Syncope and collapse: Secondary | ICD-10-CM

## 2015-08-29 NOTE — Telephone Encounter (Signed)
Rec a fx requesting a referral to Dr. Virl Axe for ICD 10   I 47.1. Please place referral

## 2015-08-30 NOTE — Telephone Encounter (Signed)
Ok Thx 

## 2015-08-31 ENCOUNTER — Ambulatory Visit: Payer: 59 | Admitting: Internal Medicine

## 2015-09-02 DIAGNOSIS — Z0279 Encounter for issue of other medical certificate: Secondary | ICD-10-CM

## 2015-09-03 ENCOUNTER — Encounter: Payer: Self-pay | Admitting: Internal Medicine

## 2015-09-04 ENCOUNTER — Telehealth: Payer: Self-pay | Admitting: *Deleted

## 2015-09-04 NOTE — Telephone Encounter (Signed)
Pt wants to know if you will see her daughter as a new patient. She has recently moved back to [place] and needs a PCP. Please advise.

## 2015-09-04 NOTE — Telephone Encounter (Signed)
Ok Thx 

## 2015-09-06 NOTE — Telephone Encounter (Signed)
Left detailed mess informing pt to have her daughter call our office to schedule a new pt OV with Dr. Alain Marion. Dartha Lodge., front office team lead/scheduler informed.

## 2015-09-11 ENCOUNTER — Encounter: Payer: Self-pay | Admitting: Family

## 2015-09-11 ENCOUNTER — Ambulatory Visit (INDEPENDENT_AMBULATORY_CARE_PROVIDER_SITE_OTHER): Payer: 59 | Admitting: Family

## 2015-09-11 VITALS — BP 140/88 | HR 89 | Temp 98.2°F | Resp 18 | Ht 65.0 in | Wt 178.0 lb

## 2015-09-11 DIAGNOSIS — T23272A Burn of second degree of left wrist, initial encounter: Secondary | ICD-10-CM | POA: Diagnosis not present

## 2015-09-11 HISTORY — DX: Burn of second degree of left wrist, initial encounter: T23.272A

## 2015-09-11 NOTE — Assessment & Plan Note (Signed)
Symptoms and exam consistent with partial thickness burn without complication or signs of infection. Treat conservatively with basic wound care. Wound cleansed with soap and water with triple antibiotic and bandage applied. Treatment tolerated well. Follow-up if symptoms worsen or fail to improve.

## 2015-09-11 NOTE — Progress Notes (Addendum)
Subjective:    Patient ID: Beth Huffman, female    DOB: 1961/09/22, 54 y.o.   MRN: [id number]  Chief Complaint  Patient presents with  . Burn    on sunday heated up water in the microwave and she spilt it on her left hand and wrist    HPI:  Beth Huffman is a 54 y.o. female who  has a past medical history of Syringomyelia ([place]); GERD (gastroesophageal reflux disease); LBP (low back pain); Migraines; Pyelonephritis (2008); UTI (lower urinary tract infection); Stress (2009); Abnormal weight gain; Syringobulbia ([place]); Hypertension; and Tachycardia. and presents today for an acute office visit.   This is a new problem. Associated symptom of a burn located on her left hand and wrist occurred approximately 2 days ago when she spilled hot water on her hand while removing a container from the microwave. Modifying factors include using aloe and burn pads and gauze. Described as blistering and oozing with redness and inflammation. Described as stinging and burning pain.  Allergies  Allergen Reactions  . Atenolol     Wt gain  . Cefuroxime Axetil     REACTION: nausea - but tolerates PCN  . Codeine Sulfate Nausea Only  . Fentanyl     REACTION: bad reaction  . Hydrocodone-Acetaminophen   . Nitrofurantoin     REACTION: HA  . Pregabalin     REACTION: cp  . Rapaflo [Silodosin]     Orthostatic BP drop, near syncope   . Tramadol Hcl     REACTION: sick     Current Outpatient Prescriptions on File Prior to Visit  Medication Sig Dispense Refill  . cholecalciferol (VITAMIN D) 1000 UNITS tablet Take 1,000 Units by mouth daily.      . cyclobenzaprine (FLEXERIL) 5 MG tablet TK 1 T PO TID PRF MUSCLE SPASM  1  . estradiol (CLIMARA - DOSED IN MG/24 HR) 0.075 mg/24hr patch Place 0.075 mg onto the skin once a week.    . hydrochlorothiazide (MICROZIDE) 12.5 MG capsule Take 1 capsule (12.5 mg total) by mouth daily. 30 capsule 3  . Multiple Vitamin (MULTIVITAMIN PO) Take 1 tablet by mouth daily.       . naproxen (NAPROSYN) 500 MG tablet TAKE 1 TABLET EVERY TWELVE HOURS AS NEEDED. 60 tablet 6  . nortriptyline (PAMELOR) 25 MG capsule Take 1 capsule by mouth at bedtime.     . Ondansetron HCl (ZOFRAN PO) Take by mouth.    . oxyCODONE (ROXICODONE) 15 MG immediate release tablet Take 1 tablet (15 mg total) by mouth every 6 (six) hours as needed for pain. Please fill on or after 09/16/15 120 tablet 0  . pantoprazole (PROTONIX) 40 MG tablet TAKE 1 TABLET DAILY. 30 tablet 5  . SUMAtriptan (IMITREX) 100 MG tablet Take 1 tablet (100 mg total) by mouth as needed for migraine (May repeat once in 2 hours if needed. Max 2 tabs in 24 hours). 9 tablet 6  . topiramate (TOPAMAX) 200 MG tablet TAKE 1 TABLET TWICE DAILY. 60 tablet 11  . verapamil (CALAN-SR) 120 MG CR tablet TAKE ONE TABLET AT BEDTIME. 30 tablet 11   No current facility-administered medications on file prior to visit.    Review of Systems  Constitutional: Negative for fever and chills.  Skin: Positive for wound.  Neurological: Negative for numbness.      Objective:    BP 140/88 mmHg  Pulse 89  Temp(Src) 98.2 F (36.8 C) (Oral)  Resp 18  Ht 5\' 5"  (1.651  m)  Wt 178 lb (80.74 kg)  BMI 29.62 kg/m2  SpO2 97% Nursing note and vital signs reviewed.  Physical Exam  Constitutional: She is oriented to person, place, and time. She appears well-developed and well-nourished. No distress.  Cardiovascular: Normal rate, regular rhythm, normal heart sounds and intact distal pulses.   Pulmonary/Chest: Effort normal and breath sounds normal.  Neurological: She is alert and oriented to person, place, and time.  Skin: Skin is warm and dry.  6 cm x 6 cm with ruptured blisters. Mild serous discharge with no pus or other signs of infection.  Psychiatric: She has a normal mood and affect. Her behavior is normal. Judgment and thought content normal.       Assessment & Plan:   Problem List Items Addressed This Visit      Other   Partial  thickness burn of left wrist - Primary    Symptoms and exam consistent with partial thickness burn without complication or signs of infection. Treat conservatively with basic wound care. Wound cleansed with soap and water with triple antibiotic and bandage applied. Treatment tolerated well. Follow-up if symptoms worsen or fail to improve.
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
## 2015-09-11 NOTE — Progress Notes (Signed)
Pre visit review using our clinic review tool, if applicable. No additional management support is needed unless otherwise documented below in the visit note. 

## 2015-09-12 ENCOUNTER — Ambulatory Visit: Payer: 59 | Admitting: Family

## 2015-09-28 ENCOUNTER — Encounter: Payer: Self-pay | Admitting: Internal Medicine

## 2015-09-28 ENCOUNTER — Ambulatory Visit (INDEPENDENT_AMBULATORY_CARE_PROVIDER_SITE_OTHER): Payer: 59 | Admitting: Internal Medicine

## 2015-09-28 VITALS — BP 138/80 | HR 96 | Wt 177.0 lb

## 2015-09-28 DIAGNOSIS — M5440 Lumbago with sciatica, unspecified side: Secondary | ICD-10-CM

## 2015-09-28 DIAGNOSIS — R635 Abnormal weight gain: Secondary | ICD-10-CM | POA: Diagnosis not present

## 2015-09-28 DIAGNOSIS — M542 Cervicalgia: Secondary | ICD-10-CM

## 2015-09-28 DIAGNOSIS — Z23 Encounter for immunization: Secondary | ICD-10-CM

## 2015-09-28 MED ORDER — PANTOPRAZOLE SODIUM 40 MG PO TBEC: 40.0000 mg | DELAYED_RELEASE_TABLET | Freq: Every day | ORAL | Status: DC

## 2015-09-28 MED ORDER — CYCLOBENZAPRINE HCL 5 MG PO TABS: 5.0000 mg | ORAL_TABLET | Freq: Three times a day (TID) | ORAL | Status: DC | PRN

## 2015-09-28 MED ORDER — OXYCODONE HCL 15 MG PO TABS: 15.0000 mg | ORAL_TABLET | Freq: Four times a day (QID) | ORAL | Status: DC | PRN
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
## 2015-09-28 NOTE — Progress Notes (Signed)
Pre visit review using our clinic review tool, if applicable. No additional management support is needed unless otherwise documented below in the visit note. 

## 2015-09-30 ENCOUNTER — Encounter: Payer: Self-pay | Admitting: Internal Medicine

## 2015-09-30 NOTE — Assessment & Plan Note (Signed)
Chronic, lately disabling Due to syringomyelia On Oxycodone  Potential benefits of a long term opioids use as well as potential risks (i.e. addiction risk, apnea etc) and complications (i.e. Somnolence, constipation and others) were explained to the patient and were aknowledged.

## 2015-09-30 NOTE — Assessment & Plan Note (Signed)
Wt Readings from Last 3 Encounters:  09/28/15 177 lb (80.287 kg)  09/11/15 178 lb (80.74 kg)  08/22/15 183 lb 9.6 oz (83.28 kg)  better

## 2015-11-13 ENCOUNTER — Encounter: Payer: Self-pay | Admitting: Internal Medicine

## 2015-11-13 ENCOUNTER — Ambulatory Visit (INDEPENDENT_AMBULATORY_CARE_PROVIDER_SITE_OTHER): Payer: BLUE CROSS/BLUE SHIELD | Admitting: Internal Medicine

## 2015-11-13 VITALS — BP 130/88 | HR 88 | Ht 65.0 in | Wt 170.8 lb

## 2015-11-13 DIAGNOSIS — I471 Supraventricular tachycardia: Secondary | ICD-10-CM

## 2015-11-13 DIAGNOSIS — I10 Essential (primary) hypertension: Secondary | ICD-10-CM | POA: Diagnosis not present

## 2015-11-13 LAB — CBC
HCT: 38.4 % (ref 36.0–46.0)
HEMOGLOBIN: 12.6 g/dL (ref 12.0–15.0)
MCH: 30.2 pg (ref 26.0–34.0)
MCHC: 32.8 g/dL (ref 30.0–36.0)
MCV: 92.1 fL (ref 78.0–100.0)
MPV: 10.8 fL (ref 8.6–12.4)
Platelets: 198 10*3/uL (ref 150–400)
RBC: 4.17 MIL/uL (ref 3.87–5.11)
RDW: 14.1 % (ref 11.5–15.5)
WBC: 6.7 10*3/uL (ref 4.0–10.5)

## 2015-11-13 LAB — BASIC METABOLIC PANEL
BUN: 10 mg/dL (ref 7–25)
CALCIUM: 8.8 mg/dL (ref 8.6–10.4)
CO2: 21 mmol/L (ref 20–31)
Chloride: 109 mmol/L (ref 98–110)
Creat: 0.79 mg/dL (ref 0.50–1.05)
GLUCOSE: 86 mg/dL (ref 65–99)
Potassium: 3.8 mmol/L (ref 3.5–5.3)
SODIUM: 140 mmol/L (ref 135–146)

## 2015-11-13 MED ORDER — VERAPAMIL HCL ER 120 MG PO TBCR: EXTENDED_RELEASE_TABLET | ORAL | Status: DC

## 2015-11-13 NOTE — Progress Notes (Signed)
Patient Care Team: Cassandria Anger, MD as PCP - General Princess Bruins, MD as Consulting Physician (Obstetrics and Gynecology) Larey Seat, MD as Consulting Physician (Neurology)   HPI  Beth Huffman is a 55 y.o. female Seen in follow-up for symptomatic nonsustained atrial tachycardia  she also has hypertension. She has had problems with headaches and takes number of neurological medications and sotalol has been reversed and taking her verapamil and her blood pressure.  She's had infrequent palpitations perhaps a couple times a year.  Records and Results Reviewed   Past Medical History  Diagnosis Date  . Syringomyelia ([place])   . GERD (gastroesophageal reflux disease)   . LBP (low back pain)   . Migraines   . Pyelonephritis 2008  . UTI (lower urinary tract infection)   . Stress 2009  . Abnormal weight gain   . Syringobulbia ([place])   . Hypertension   . Tachycardia     Past Surgical History  Procedure Laterality Date  . Intracranial decompression surgery  2006    Current Outpatient Prescriptions  Medication Sig Dispense Refill  . cholecalciferol (VITAMIN D) 1000 UNITS tablet Take 1,000 Units by mouth daily.      . cyclobenzaprine (FLEXERIL) 5 MG tablet Take 1 tablet (5 mg total) by mouth 3 (three) times daily as needed for muscle spasms. 90 tablet 2  . estradiol (CLIMARA - DOSED IN MG/24 HR) 0.075 mg/24hr patch Place 0.075 mg onto the skin once a week.    . hydrochlorothiazide (MICROZIDE) 12.5 MG capsule Take 1 capsule (12.5 mg total) by mouth daily. (Patient taking differently: Take 12.5 mg by mouth daily as needed. ) 30 capsule 3  . Multiple Vitamin (MULTIVITAMIN PO) Take 1 tablet by mouth daily.      . naproxen (NAPROSYN) 500 MG tablet TAKE 1 TABLET EVERY TWELVE HOURS AS NEEDED. 60 tablet 6  . nortriptyline (PAMELOR) 25 MG capsule Take 1 capsule by mouth at bedtime.     Marland Kitchen oxyCODONE (ROXICODONE) 15 MG immediate release tablet Take 1 tablet (15 mg  total) by mouth every 6 (six) hours as needed for pain. Please fill on or after 12/25/15 120 tablet 0  . pantoprazole (PROTONIX) 40 MG tablet Take 1 tablet (40 mg total) by mouth daily. 30 tablet 11  . SUMAtriptan (IMITREX) 100 MG tablet Take 1 tablet (100 mg total) by mouth as needed for migraine (May repeat once in 2 hours if needed. Max 2 tabs in 24 hours). 9 tablet 6  . topiramate (TOPAMAX) 200 MG tablet TAKE 1 TABLET TWICE DAILY. 60 tablet 11  . verapamil (CALAN-SR) 120 MG CR tablet TAKE ONE TABLET AT BEDTIME. 30 tablet 11   No current facility-administered medications for this visit.    Allergies  Allergen Reactions  . Atenolol     Wt gain  . Cefuroxime Axetil     REACTION: nausea - but tolerates PCN  . Codeine Sulfate Nausea Only  . Fentanyl     REACTION: bad reaction  . Hydrocodone-Acetaminophen   . Nitrofurantoin     REACTION: HA  . Pregabalin     REACTION: cp  . Rapaflo [Silodosin]     Orthostatic BP drop, near syncope   . Tramadol Hcl     REACTION: sick      Review of Systems negative except from HPI and PMH  Physical Exam BP 130/88 mmHg  Pulse 88  Ht 5\' 5"  (1.651 m)  Wt 170 lb 12.8 oz (77.474 kg)  BMI 28.42 kg/m2 Well developed and well nourished in no acute distress HENT normal E scleral and icterus clear Neck Supple JVP flat; carotids brisk and full Clear to ausculation  Regular rate and rhythm, no murmurs gallops or rub Soft with active bowel sounds No clubbing cyanosis  Edema Alert and oriented, grossly normal motor and sensory function Skin Warm and Dry  ECG sinus88  Assessment and  Plan  HA  Hypertension   Atrial tachycardia  We had a lengthy discussion regarding the multiple medications she had. We reviewed the pharmacology and the side effects and they renal issues. We discussed the importance of long-term management of her hypertension. She is amenable to taking verapamil which could address both her blood pressure as well as her  tachycardia.  We spent more than 50% of our >25 min visit in face to face counseling regarding the above

## 2015-11-13 NOTE — Patient Instructions (Addendum)
Medication Instructions: - no changes  Labwork: - Your physician recommends that you have lab work today: BMP/ TSH/ CBC  Procedures/Testing: - none  Follow-Up: - Your physician wants you to follow-up in: 1 year with Dr. Caryl Comes. You will receive a reminder letter in the mail two months in advance. If you don't receive a letter, please call our office to schedule the follow-up appointment.  Any Additional Special Instructions Will Be Listed Below (If Applicable).

## 2015-11-14 LAB — TSH: TSH: 1.633 u[IU]/mL (ref 0.350–4.500)

## 2015-11-30 ENCOUNTER — Encounter: Payer: Self-pay | Admitting: Obstetrics & Gynecology

## 2015-12-04 ENCOUNTER — Other Ambulatory Visit: Payer: Self-pay | Admitting: Obstetrics & Gynecology

## 2015-12-04 DIAGNOSIS — R928 Other abnormal and inconclusive findings on diagnostic imaging of breast: Secondary | ICD-10-CM

## 2015-12-12 ENCOUNTER — Ambulatory Visit
Admission: RE | Admit: 2015-12-12 | Discharge: 2015-12-12 | Disposition: A | Discharge status: Home / Self Care | Payer: BLUE CROSS/BLUE SHIELD | Source: Ambulatory Visit | Attending: Obstetrics & Gynecology | Admitting: Obstetrics & Gynecology

## 2015-12-12 DIAGNOSIS — R928 Other abnormal and inconclusive findings on diagnostic imaging of breast: Secondary | ICD-10-CM

## 2015-12-24 DIAGNOSIS — Z0271 Encounter for disability determination: Secondary | ICD-10-CM

## 2016-01-02 ENCOUNTER — Ambulatory Visit: Payer: 59 | Admitting: Internal Medicine

## 2016-01-09 ENCOUNTER — Ambulatory Visit: Payer: BLUE CROSS/BLUE SHIELD | Admitting: Internal Medicine

## 2016-01-30 ENCOUNTER — Ambulatory Visit (INDEPENDENT_AMBULATORY_CARE_PROVIDER_SITE_OTHER): Payer: BLUE CROSS/BLUE SHIELD | Admitting: Internal Medicine

## 2016-01-30 ENCOUNTER — Encounter: Payer: Self-pay | Admitting: Internal Medicine

## 2016-01-30 VITALS — BP 116/84 | HR 90 | Temp 97.9°F | Ht 65.0 in | Wt 170.0 lb

## 2016-01-30 DIAGNOSIS — M5440 Lumbago with sciatica, unspecified side: Secondary | ICD-10-CM | POA: Diagnosis not present

## 2016-01-30 DIAGNOSIS — B351 Tinea unguium: Secondary | ICD-10-CM

## 2016-01-30 HISTORY — DX: Tinea unguium: B35.1

## 2016-01-30 MED ORDER — CICLOPIROX 8 % EX SOLN: Freq: Every day | CUTANEOUS | Status: DC

## 2016-01-30 MED ORDER — OXYCODONE HCL 15 MG PO TABS: 15.0000 mg | ORAL_TABLET | Freq: Four times a day (QID) | ORAL | Status: DC | PRN

## 2016-01-30 NOTE — Progress Notes (Signed)
Subjective:  Patient ID: Beth Huffman, female    DOB: October 11, 1961  Age: 55 y.o. MRN: [id number]  CC: Follow-up and Nail Problem   HPI Beth Huffman presents for chronic pain, GERD, insomnia f/u  Outpatient Prescriptions Prior to Visit  Medication Sig Dispense Refill  . cholecalciferol (VITAMIN D) 1000 UNITS tablet Take 1,000 Units by mouth daily.      . cyclobenzaprine (FLEXERIL) 5 MG tablet Take 1 tablet (5 mg total) by mouth 3 (three) times daily as needed for muscle spasms. 90 tablet 2  . estradiol (CLIMARA - DOSED IN MG/24 HR) 0.075 mg/24hr patch Place 0.075 mg onto the skin once a week.    . Multiple Vitamin (MULTIVITAMIN PO) Take 1 tablet by mouth daily.      . naproxen (NAPROSYN) 500 MG tablet TAKE 1 TABLET EVERY TWELVE HOURS AS NEEDED. 60 tablet 6  . nortriptyline (PAMELOR) 25 MG capsule Take 1 capsule by mouth at bedtime.     . pantoprazole (PROTONIX) 40 MG tablet Take 1 tablet (40 mg total) by mouth daily. 30 tablet 11  . SUMAtriptan (IMITREX) 100 MG tablet Take 1 tablet (100 mg total) by mouth as needed for migraine (May repeat once in 2 hours if needed. Max 2 tabs in 24 hours). 9 tablet 6  . topiramate (TOPAMAX) 200 MG tablet TAKE 1 TABLET TWICE DAILY. 60 tablet 11  . verapamil (CALAN-SR) 120 MG CR tablet TAKE ONE TABLET AT BEDTIME. 30 tablet 11  . oxyCODONE (ROXICODONE) 15 MG immediate release tablet Take 1 tablet (15 mg total) by mouth every 6 (six) hours as needed for pain. Please fill on or after 12/25/15 120 tablet 0  . hydrochlorothiazide (MICROZIDE) 12.5 MG capsule Take 1 capsule (12.5 mg total) by mouth daily. (Patient not taking: Reported on 01/30/2016) 30 capsule 3   No facility-administered medications prior to visit.    ROS Review of Systems  Constitutional: Negative for chills, activity change, appetite change, fatigue and unexpected weight change.  HENT: Negative for congestion, mouth sores and sinus pressure.   Eyes: Negative for visual disturbance.    Respiratory: Negative for cough and chest tightness.   Gastrointestinal: Negative for nausea and abdominal pain.  Genitourinary: Negative for frequency, difficulty urinating and vaginal pain.  Musculoskeletal: Positive for back pain. Negative for gait problem.  Skin: Negative for pallor and rash.  Neurological: Positive for weakness. Negative for dizziness, tremors, numbness and headaches.  Psychiatric/Behavioral: Negative for suicidal ideas, confusion and sleep disturbance.    Objective:  BP 116/84 mmHg  Pulse 90  Temp(Src) 97.9 F (36.6 C) (Oral)  Ht 5\' 5"  (1.651 m)  Wt 170 lb (77.111 kg)  BMI 28.29 kg/m2  SpO2 98%  BP Readings from Last 3 Encounters:  01/30/16 116/84  11/13/15 130/88  09/28/15 138/80    Wt Readings from Last 3 Encounters:  01/30/16 170 lb (77.111 kg)  11/13/15 170 lb 12.8 oz (77.474 kg)  09/28/15 177 lb (80.287 kg)    Physical Exam  Constitutional: She appears well-developed. No distress.  HENT:  Head: Normocephalic.  Right Ear: External ear normal.  Left Ear: External ear normal.  Nose: Nose normal.  Mouth/Throat: Oropharynx is clear and moist.  Eyes: Conjunctivae are normal. Pupils are equal, round, and reactive to light. Right eye exhibits no discharge. Left eye exhibits no discharge.  Neck: Normal range of motion. Neck supple. No JVD present. No tracheal deviation present. No thyromegaly present.  Cardiovascular: Normal rate, regular rhythm and normal heart  sounds.   Pulmonary/Chest: No stridor. No respiratory distress. She has no wheezes.  Abdominal: Soft. Bowel sounds are normal. She exhibits no distension and no mass. There is no tenderness. There is no rebound and no guarding.  Musculoskeletal: She exhibits tenderness. She exhibits no edema.  Lymphadenopathy:    She has no cervical adenopathy.  Neurological: She displays normal reflexes. No cranial nerve deficit. She exhibits normal muscle tone. Coordination normal.  Skin: No rash noted.  No erythema.  Psychiatric: She has a normal mood and affect. Her behavior is normal. Judgment and thought content normal.  R big toenail w/onycho  Lab Results  Component Value Date   WBC 6.7 11/13/2015   HGB 12.6 11/13/2015   HCT 38.4 11/13/2015   PLT 198 11/13/2015   GLUCOSE 86 11/13/2015   CHOL 213* 12/21/2013   TRIG 67.0 12/21/2013   HDL 67.80 12/21/2013   LDLDIRECT 130.2 12/21/2013   ALT 27 12/21/2013   AST 29 12/21/2013   NA 140 11/13/2015   K 3.8 11/13/2015   CL 109 11/13/2015   CREATININE 0.79 11/13/2015   BUN 10 11/13/2015   CO2 21 11/13/2015   TSH 1.633 11/13/2015   HGBA1C 5.3 02/25/2012    US Breast [place] [facility]  12/12/2015  CLINICAL DATA:  Recall from screening for evaluation of possible mass posterior left upper quadrant EXAM: DIGITAL DIAGNOSTIC LEFT MAMMOGRAM WITH 3D TOMOSYNTHESIS WITH CAD ULTRASOUND LEFT BREAST COMPARISON:  Previous exam(s). ACR Breast Density Category b: There are scattered areas of fibroglandular density. FINDINGS: There is a round circumscribed 7 mm mass in the posterior left upper outer quadrant. Anterior to this is another circumscribed smaller round mass. Mammographic images were processed with CAD. On physical exam, there are no palpable abnormalities. Targeted ultrasound is performed, showing a simple cyst in the 3 o'clock position of the left breast 4 cm from the nipple. It measures 8 x 6 x 8 mm. There are several tiny adjacent cysts. In the 2 o'clock position 3 cm from the nipple there is another simple cyst measuring 6 x 5 x 7 mm. There are a few internal echoes which are observed to be mobile within the cyst. There are no suspicious findings. IMPRESSION: Fibrocystic change with 2 dominant upper-outer quadrant left breast cysts. RECOMMENDATION: Return to annual screening mammography I have discussed the findings and recommendations with the patient. Results were also provided in writing at the conclusion of the visit. If applicable, a  reminder letter will be sent to the patient regarding the next appointment. BI-RADS CATEGORY  2: Benign. Electronically Signed   By: Skipper Cliche M.D.   On: 12/12/2015 10:28   Mm Diag Breast Tomo Uni Left  12/12/2015  CLINICAL DATA:  Recall from screening for evaluation of possible mass posterior left upper quadrant EXAM: DIGITAL DIAGNOSTIC LEFT MAMMOGRAM WITH 3D TOMOSYNTHESIS WITH CAD ULTRASOUND LEFT BREAST COMPARISON:  Previous exam(s). ACR Breast Density Category b: There are scattered areas of fibroglandular density. FINDINGS: There is a round circumscribed 7 mm mass in the posterior left upper outer quadrant. Anterior to this is another circumscribed smaller round mass. Mammographic images were processed with CAD. On physical exam, there are no palpable abnormalities. Targeted ultrasound is performed, showing a simple cyst in the 3 o'clock position of the left breast 4 cm from the nipple. It measures 8 x 6 x 8 mm. There are several tiny adjacent cysts. In the 2 o'clock position 3 cm from the nipple there is another simple cyst  measuring 6 x 5 x 7 mm. There are a few internal echoes which are observed to be mobile within the cyst. There are no suspicious findings. IMPRESSION: Fibrocystic change with 2 dominant upper-outer quadrant left breast cysts. RECOMMENDATION: Return to annual screening mammography I have discussed the findings and recommendations with the patient. Results were also provided in writing at the conclusion of the visit. If applicable, a reminder letter will be sent to the patient regarding the next appointment. BI-RADS CATEGORY  2: Benign. Electronically Signed   By: Skipper Cliche M.D.   On: 12/12/2015 10:28    Assessment & Plan:   Beth Huffman was seen today for follow-up and nail problem.  Diagnoses and all orders for this visit:  Low back pain with sciatica, sciatica laterality unspecified, unspecified back pain laterality, unspecified chronicity  Other orders -      ciclopirox (PENLAC) 8 % solution; Apply topically at bedtime. Apply over nail and surrounding skin. Apply daily over previous coat. After seven (7) days, may remove with alcohol and continue cycle. -     Discontinue: oxyCODONE (ROXICODONE) 15 MG immediate release tablet; Take 1 tablet (15 mg total) by mouth every 6 (six) hours as needed for pain. Please fill on or after 4/517 -     Discontinue: oxyCODONE (ROXICODONE) 15 MG immediate release tablet; Take 1 tablet (15 mg total) by mouth every 6 (six) hours as needed for pain. Please fill on or after 02/29/16 -     oxyCODONE (ROXICODONE) 15 MG immediate release tablet; Take 1 tablet (15 mg total) by mouth every 6 (six) hours as needed for pain. Please fill on or after 03/31/16   I have discontinued Ms. Dispenza's oxyCODONE and oxyCODONE. I have also changed her oxyCODONE. Additionally, I am having her start on ciclopirox. Lastly, I am having her maintain her cholecalciferol, Multiple Vitamin (MULTIVITAMIN PO), hydrochlorothiazide, estradiol, SUMAtriptan, nortriptyline, topiramate, naproxen, cyclobenzaprine, pantoprazole, and verapamil.  Meds ordered this encounter  Medications  . ciclopirox (PENLAC) 8 % solution    Sig: Apply topically at bedtime. Apply over nail and surrounding skin. Apply daily over previous coat. After seven (7) days, may remove with alcohol and continue cycle.    Dispense:  6.6 mL    Refill:  0  . DISCONTD: oxyCODONE (ROXICODONE) 15 MG immediate release tablet    Sig: Take 1 tablet (15 mg total) by mouth every 6 (six) hours as needed for pain. Please fill on or after 4/517    Dispense:  120 tablet    Refill:  0  . DISCONTD: oxyCODONE (ROXICODONE) 15 MG immediate release tablet    Sig: Take 1 tablet (15 mg total) by mouth every 6 (six) hours as needed for pain. Please fill on or after 02/29/16    Dispense:  120 tablet    Refill:  0  . oxyCODONE (ROXICODONE) 15 MG immediate release tablet    Sig: Take 1 tablet (15 mg total) by mouth  every 6 (six) hours as needed for pain. Please fill on or after 03/31/16    Dispense:  120 tablet    Refill:  0     Follow-up: No Follow-up on file.  Walker Kehr, MD

## 2016-01-30 NOTE — Assessment & Plan Note (Signed)
[place]

## 2016-01-30 NOTE — Progress Notes (Signed)
Pre visit review using our clinic review tool, if applicable. No additional management support is needed unless otherwise documented below in the visit note. 

## 2016-02-01 ENCOUNTER — Telehealth: Payer: Self-pay | Admitting: Internal Medicine

## 2016-02-01 ENCOUNTER — Other Ambulatory Visit: Payer: Self-pay | Admitting: Geriatric Medicine

## 2016-02-01 MED ORDER — CICLOPIROX 8 % EX SOLN: Freq: Every day | CUTANEOUS | Status: DC

## 2016-02-01 MED ORDER — OXYCODONE HCL 15 MG PO TABS: 15.0000 mg | ORAL_TABLET | Freq: Four times a day (QID) | ORAL | Status: DC | PRN

## 2016-02-01 NOTE — Telephone Encounter (Signed)
Pt called state she miss place two written rx for Penilac and oxycodone, please help, she really need this rx.

## 2016-02-01 NOTE — Telephone Encounter (Signed)
Left message informing patient she has prescriptions here to pick up.

## 2016-02-01 NOTE — Telephone Encounter (Signed)
OK to re-do Thx

## 2016-02-01 NOTE — Telephone Encounter (Signed)
Do you want to re-write these prescriptions? Please advise, thanks.

## 2016-02-02 ENCOUNTER — Other Ambulatory Visit: Payer: Self-pay | Admitting: Neurology

## 2016-02-20 ENCOUNTER — Encounter: Payer: Self-pay | Admitting: Nurse Practitioner

## 2016-02-20 ENCOUNTER — Ambulatory Visit (INDEPENDENT_AMBULATORY_CARE_PROVIDER_SITE_OTHER): Payer: BLUE CROSS/BLUE SHIELD | Admitting: Nurse Practitioner

## 2016-02-20 VITALS — BP 123/78 | HR 95 | Ht 65.0 in | Wt 169.2 lb

## 2016-02-20 DIAGNOSIS — G43019 Migraine without aura, intractable, without status migrainosus: Secondary | ICD-10-CM

## 2016-02-20 MED ORDER — SUMATRIPTAN SUCCINATE 100 MG PO TABS: ORAL_TABLET | ORAL | Status: DC

## 2016-02-20 MED ORDER — NORTRIPTYLINE HCL 25 MG PO CAPS: 25.0000 mg | ORAL_CAPSULE | Freq: Every day | ORAL | Status: DC

## 2016-02-20 NOTE — Progress Notes (Signed)
[facility]  PATIENT: Beth Huffman DOB: 25-Mar-1961   REASON FOR VISIT: follow up for migraine,  HISTORY FROM: Patient    HISTORY OF PRESENT ILLNESS: HISTORY: Beth Huffman is a 55 years old right-handed female, referred by her primary care physician Dr. Lauraine Rinne evaluation of chronic headaches I saw her previously in May 2012 for similar complaints, she presented with chronic headaches since early 1990s, extensive imaging study in the past showed evidence of giant cistern magna, she underwent occipital posterior fossa decompression surgery in [place] in 2005 with some improvement of her headaches.  I have reviewed MRI report in 1994, from [facility] prior to her posterior fossa decompression surgery, showed a large CSF signal density in the posterior fossa, posterior to the vermis, in the midline. 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 Additional syrinx is present from T3-T12 levels enlarging to maximum diameter at the T4 level measuring 65mm and at the T10 level measuring 31mm. Incidental note made of bilateral perineural cysts at T10 level.  CT cystogram in 2007, following decompression surgery: Again demonstrated is a cystic structure in the posterior fossa. There is a mega cisterna magna present. In addition the is a more focal collection within the posterior fossa. This area completely fills with contrast with no differential filling demonstrated to suggest residual cyst. A small amount of interventricular contrast is demonstrated. There is no hydrocephalus. There is no other intracranial mass or mass-effect. Postoperative changes of a suboccipital craniotomy are present.  EMG nerve conduction study in 2007, evidence of mild bilateral carpal tunnel  syndromes.  Since May 2016, she had more severe, frequent headaches, bilateral area severe pounding headaches, lasting for few days, failed to improved by Tylenol, ibuprofen, Zofran, BC powder, repeat dose of Imitrex, eventually presented to urgent care, her headache was improved after receiving Demerol, Phenergan shots, sleeping, She continues to have headaches 2-3 times each week, lasting few hours to 2 days, bilateral retro-orbital area severe pounding headache with associated light noise sensitivity, nauseous,  Over the years, she has tried different preventive medications, currently still taking Topamax 200 mg twice a day, verapamil 120 mg daily, has not tried Inderal, nortriptyline, questionable benefit from limited trial of Botox in the past, Trigger for her headaches are weather change, oversleep, stress, For abortive treatment, she has tried Maxalt, Relpax, Zomig Currently she is taking Imitrex 100 mg 10 tablets each months,  UPDATE July 27th 2016 Her headache has much improved, she has average 4-5 severe headaches each months, Imitrex half to one tablets as needed, plus naproxen 500 mg as needed has been helpful, her insurance does not cover [place]  She is overall happy about current progress, will stay on Topamax 200 mg twice a day, verapamil 120 mg a day, and nortriptyline, she complains mild dizziness, left ear reining recently, no hearing loss UPDATE 04/26/2017CM:  Beth Huffman, 55 year old female returns for follow-up. She was last seen in the office 08/22/15.  She has had a total of 5 headaches in the last month but not all of them were severe. Most of them occur after she has had a long day at work and she has been doing lifting. 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  She is still overall  pleased with her current progress and does not wish to have any additional medications added. Imitrex works acutely. Her triggers are stress and weather changes. After her  business is closed she wants to think about getting off of some of her migraine preventive medications. She returns for reevaluation    REVIEW OF SYSTEMS: Full 14 system review of systems performed and notable only for those listed, all others are neg:  Constitutional: neg  Cardiovascular: neg Ear/Nose/Throat: neg  Skin: neg Eyes: neg Respiratory: neg Gastroitestinal: neg  Hematology/Lymphatic: neg  Endocrine: neg Musculoskeletal:neg Allergy/Immunology: neg Neurological: neg Psychiatric: neg Sleep : neg   ALLERGIES: Allergies  Allergen Reactions  . Atenolol     Wt gain  . Cefuroxime Axetil     REACTION: nausea - but tolerates PCN  . Codeine Sulfate Nausea Only  . Fentanyl     REACTION: bad reaction  . Hydrocodone-Acetaminophen   . Nitrofurantoin     REACTION: HA  . Pregabalin     REACTION: cp  . Rapaflo [Silodosin]     Orthostatic BP drop, near syncope   . Tramadol Hcl     REACTION: sick    HOME MEDICATIONS: Outpatient Prescriptions Prior to Visit  Medication Sig Dispense Refill  . cholecalciferol (VITAMIN D) 1000 UNITS tablet Take 1,000 Units by mouth daily.      . ciclopirox (PENLAC) 8 % solution Apply topically at bedtime. Apply over nail and surrounding skin. Apply daily over previous coat. After seven (7) days, may remove with alcohol and continue cycle. 6.6 mL 0  . cyclobenzaprine (FLEXERIL) 5 MG tablet Take 1 tablet (5 mg total) by mouth 3 (three) times daily as needed for muscle spasms. 90 tablet 2  . estradiol (CLIMARA - DOSED IN MG/24 HR) 0.075 mg/24hr patch Place 0.075 mg onto the skin once a week.    . hydrochlorothiazide (MICROZIDE) 12.5 MG capsule Take 1 capsule (12.5 mg total) by mouth daily. 30 capsule 3  . Multiple Vitamin (MULTIVITAMIN PO) Take 1 tablet by mouth daily.      . naproxen (NAPROSYN) 500 MG tablet TAKE 1 TABLET EVERY TWELVE HOURS AS NEEDED. 60 tablet 6  . nortriptyline (PAMELOR) 25 MG capsule Take 1 capsule by mouth at bedtime.     Marland Kitchen  oxyCODONE (ROXICODONE) 15 MG immediate release tablet Take 1 tablet (15 mg total) by mouth every 6 (six) hours as needed for pain. Please fill on or after 03/31/16 120 tablet 0  . pantoprazole (PROTONIX) 40 MG tablet Take 1 tablet (40 mg total) by mouth daily. 30 tablet 11  . SUMAtriptan (IMITREX) 100 MG tablet TAKE 1 TABLET AT ONSET OF MIGRAINE- MAY REPEAT ONCE IN 2 HOURS. LIMIT2/24 HOURS. AVOID DAILY USE. 9 tablet 11  . topiramate (TOPAMAX) 200 MG tablet TAKE 1 TABLET TWICE DAILY. 60 tablet 11  . verapamil (CALAN-SR) 120 MG CR tablet TAKE ONE TABLET AT BEDTIME. 30 tablet 11   No facility-administered medications prior to visit.    PAST MEDICAL HISTORY: Past Medical History  Diagnosis Date  . Syringomyelia ([place])   . GERD (gastroesophageal reflux disease)   . LBP (low back pain)   . Migraines   . Pyelonephritis 2008  . UTI (lower urinary tract infection)   . Stress 2009  . Abnormal weight gain   . Syringobulbia ([place])   . Hypertension   . Tachycardia     PAST SURGICAL HISTORY: Past Surgical History  Procedure Laterality Date  . Intracranial decompression surgery  2006  FAMILY HISTORY: Family History  Problem Relation Age of Onset  . COPD Mother   . Peripheral vascular disease Mother   . Hypertension Mother   . Heart disease Mother   . Anxiety disorder Other   . Hypertension Father   . Heart attack Maternal Grandmother   . Stroke Maternal Grandfather   . Stroke Paternal Grandfather     SOCIAL HISTORY: Social History   Social History  . Marital Status: Married    Spouse Name: N/A  . Number of Children: 2  . Years of Education: College   Occupational History  . small business owner    Social History Main Topics  . Smoking status: Never Smoker   . Smokeless tobacco: Not on file  . Alcohol Use: No  . Drug Use: No  . Sexual Activity: Yes   Other Topics Concern  . Not on file   Social History Narrative   Lives at home with her husband.   Right-handed.   1  cup caffeine per day.     PHYSICAL EXAM  Filed Vitals:   02/20/16 0937  BP: 123/78  Pulse: 95  Height: 5\' 5"  (1.651 m)  Weight: 169 lb 3.2 oz (76.749 kg)   Body mass index is 28.16 kg/(m^2). Generalized: Well developed, in no acute distress  Head: normocephalic and atraumatic,. Oropharynx benign  Neck: Supple, no carotid bruits  Cardiac: Regular rate rhythm, no murmur  Musculoskeletal: No deformity   Neurological examination   Mentation: Alert oriented to time, place, history taking. Attention span and concentration appropriate. Recent and remote memory intact. Follows all commands speech and language fluent.   Cranial nerve II-XII: Pupils were equal round reactive to light extraocular movements were full, visual field were full on confrontational test. Facial sensation and strength were normal. hearing was intact to finger rubbing bilaterally. Uvula tongue midline. head turning and shoulder shrug were normal and symmetric.Tongue protrusion into cheek strength was normal. Motor: normal bulk and tone, full strength in the BUE, BLE, fine finger movements normal, no pronator drift. No focal weakness Sensory: normal and symmetric to light touch, pinprick, and Vibration,  Coordination: finger-nose-finger, heel-to-shin bilaterally, no dysmetria Reflexes: Brachioradialis 2/2, biceps 2/2, triceps 2/2, patellar 2/2, Achilles 2/2, plantar responses were flexor bilaterally. Gait and Station: Rising up from seated position without assistance, normal stance, moderate stride, good arm swing, smooth turning, able to perform tiptoe, and heel walking without difficulty. Tandem gait is steady  DIAGNOSTIC DATA (LABS, IMAGING, TESTING) - I reviewed patient records, labs, notes, testing and imaging myself where available.  Lab Results  Component Value Date   WBC 6.7 11/13/2015   HGB 12.6 11/13/2015   HCT 38.4 11/13/2015   MCV 92.1 11/13/2015   PLT 198 11/13/2015      Component Value  Date/Time   NA 140 11/13/2015 1520   K 3.8 11/13/2015 1520   CL 109 11/13/2015 1520   CO2 21 11/13/2015 1520   GLUCOSE 86 11/13/2015 1520   BUN 10 11/13/2015 1520   CREATININE 0.79 11/13/2015 1520   CREATININE 0.9 08/23/2014 1028   CALCIUM 8.8 11/13/2015 1520   PROT 7.1 12/21/2013 0900   ALBUMIN 4.1 12/21/2013 0900   AST 29 12/21/2013 0900   ALT 27 12/21/2013 0900   ALKPHOS 71 12/21/2013 0900   BILITOT 0.8 12/21/2013 0900   GFRNONAA 85.62 03/06/2010 0838   GFRAA 86 04/11/2008 0901    Lab Results  Component Value Date   TSH 1.633 11/13/2015  ASSESSMENT AND PLAN 55 y.o. year old female has a past medical history of ; LBP (low back pain); Migraines; ; Syringobulbia ([place]); Hypertension; and Tachycardia. here to follow-up. Her headaches are in  good control and she does not wish to add any additional medications at this time  Continue current preventive medications, Topamax 200 mg twice a day,  Verapamil 120 daily,  Nortriptyline 25 mg every night Imitrex 100mg  prn  Naproxen 500 mg as needed At next visit will attempt to decrease preventive meds.  Dennie Bible, [facility], [facility], APRN  [facility] [address] [place], [place] [address] [phone]

## 2016-02-20 NOTE — Patient Instructions (Signed)
Continue current preventive medications,  Topamax 200 mg twice a day,  Verapamil 120 daily,  Nortriptyline 25 mg every night Imitrex 100mg  prn  Naproxen 500 mg as needed Follow up in 6 months

## 2016-03-27 ENCOUNTER — Other Ambulatory Visit: Payer: Self-pay | Admitting: Internal Medicine

## 2016-04-09 ENCOUNTER — Other Ambulatory Visit: Payer: Self-pay | Admitting: *Deleted

## 2016-04-09 MED ORDER — OXYCODONE HCL 15 MG PO TABS: 15.0000 mg | ORAL_TABLET | Freq: Four times a day (QID) | ORAL | Status: DC | PRN

## 2016-04-09 NOTE — Telephone Encounter (Signed)
Pt is req Rf on Oxycodone 15 mg. OV is scheduled for 05/07/16.  Ok to Rf?

## 2016-04-09 NOTE — Telephone Encounter (Signed)
OK 

## 2016-04-10 MED ORDER — OXYCODONE HCL 15 MG PO TABS: 15.0000 mg | ORAL_TABLET | Freq: Four times a day (QID) | ORAL | Status: DC | PRN

## 2016-04-10 NOTE — Telephone Encounter (Signed)
Rx printed/signed/upfront for p/u. Left detailed mess informing pt.  

## 2016-04-10 NOTE — Addendum Note (Signed)
Addended by: Cresenciano Lick on: 04/10/2016 08:40 AM   Modules accepted: Orders

## 2016-04-16 ENCOUNTER — Other Ambulatory Visit: Payer: Self-pay | Admitting: Internal Medicine

## 2016-04-28 DIAGNOSIS — N816 Rectocele: Secondary | ICD-10-CM | POA: Diagnosis not present

## 2016-04-28 DIAGNOSIS — N815 Vaginal enterocele: Secondary | ICD-10-CM | POA: Diagnosis not present

## 2016-05-07 ENCOUNTER — Ambulatory Visit: Payer: BLUE CROSS/BLUE SHIELD | Admitting: Internal Medicine

## 2016-05-09 ENCOUNTER — Telehealth: Payer: Self-pay | Admitting: Emergency Medicine

## 2016-05-09 MED ORDER — OXYCODONE HCL 15 MG PO TABS: 15.0000 mg | ORAL_TABLET | Freq: Four times a day (QID) | ORAL | Status: DC | PRN

## 2016-05-09 NOTE — Telephone Encounter (Signed)
Pt called and needs a prescription refill on oxyCODONE (ROXICODONE) 15 MG immediate release tablet. [employer]. Please follow up thanks.

## 2016-05-09 NOTE — Telephone Encounter (Signed)
OK to fill this prescription with additional refills x0 OV q 3 mo Thank you!  

## 2016-05-09 NOTE — Telephone Encounter (Signed)
Rx printed/upfront for p/u. OV is scheduled for 05/30/16. Left detailed mess informing pt.

## 2016-05-16 ENCOUNTER — Ambulatory Visit: Payer: BLUE CROSS/BLUE SHIELD | Admitting: Internal Medicine

## 2016-05-28 DIAGNOSIS — R32 Unspecified urinary incontinence: Secondary | ICD-10-CM | POA: Diagnosis not present

## 2016-05-30 ENCOUNTER — Ambulatory Visit (INDEPENDENT_AMBULATORY_CARE_PROVIDER_SITE_OTHER): Payer: BLUE CROSS/BLUE SHIELD | Admitting: Internal Medicine

## 2016-05-30 ENCOUNTER — Encounter: Payer: Self-pay | Admitting: Internal Medicine

## 2016-05-30 DIAGNOSIS — N393 Stress incontinence (female) (male): Secondary | ICD-10-CM

## 2016-05-30 DIAGNOSIS — M5440 Lumbago with sciatica, unspecified side: Secondary | ICD-10-CM | POA: Diagnosis not present

## 2016-05-30 DIAGNOSIS — G950 Syringomyelia and syringobulbia: Secondary | ICD-10-CM | POA: Diagnosis not present

## 2016-05-30 DIAGNOSIS — K219 Gastro-esophageal reflux disease without esophagitis: Secondary | ICD-10-CM | POA: Diagnosis not present

## 2016-05-30 MED ORDER — OXYCODONE HCL 15 MG PO TABS: 15.0000 mg | ORAL_TABLET | Freq: Four times a day (QID) | ORAL | 0 refills | Status: DC | PRN

## 2016-05-30 NOTE — Assessment & Plan Note (Signed)
Chronic  Protonix prn  Potential benefits of a long term PPI use as well as potential risks  and complications were explained to the patient and were aknowledged.

## 2016-05-30 NOTE — Progress Notes (Signed)
Pre visit review using our clinic review tool, if applicable. No additional management support is needed unless otherwise documented below in the visit note. 

## 2016-05-30 NOTE — Patient Instructions (Signed)
GERD wedge pillow 

## 2016-05-30 NOTE — Progress Notes (Signed)
Subjective:  Patient ID: Beth Huffman, female    DOB: 1961/07/08  Age: 55 y.o. MRN: [id number]  CC: No chief complaint on file.   HPI Beth Huffman presents for LBP, GERD, HAs. Planning to have a bladder surgery in 2 weeks  Outpatient Medications Prior to Visit  Medication Sig Dispense Refill  . cholecalciferol (VITAMIN D) 1000 UNITS tablet Take 1,000 Units by mouth daily.      . cyclobenzaprine (FLEXERIL) 5 MG tablet TAKE 1 TABLET THREE TIMES DAILY AS NEEDED FOR MUSCLE SPASMS. 90 tablet 1  . estradiol (CLIMARA - DOSED IN MG/24 HR) 0.075 mg/24hr patch Place 0.075 mg onto the skin once a week. PATCH CHANGE ON TUESDAYS    . hydrochlorothiazide (MICROZIDE) 12.5 MG capsule Take 1 capsule (12.5 mg total) by mouth daily. 30 capsule 3  . Multiple Vitamin (MULTIVITAMIN PO) Take 1 tablet by mouth daily.      . naproxen (NAPROSYN) 500 MG tablet TAKE 1 TABLET EVERY TWELVE HOURS AS NEEDED. (Patient taking differently: TAKE 1 TABLET EVERY TWELVE HOURS AS NEEDED FOR PAIN) 60 tablet 6  . nortriptyline (PAMELOR) 25 MG capsule Take 1 capsule (25 mg total) by mouth at bedtime. 30 capsule 6  . ondansetron (ZOFRAN) 8 MG tablet TAKE 1 TABLET EVERY 12 HOURS IF NEEDED FOR NAUSEA. 20 tablet 0  . oxyCODONE (ROXICODONE) 15 MG immediate release tablet Take 1 tablet (15 mg total) by mouth every 6 (six) hours as needed for pain. Please fill on or after 05/09/16 120 tablet 0  . pantoprazole (PROTONIX) 40 MG tablet Take 1 tablet (40 mg total) by mouth daily. 30 tablet 11  . Riboflavin (VITAMIN B-2 PO) Take 400 mg by mouth 2 (two) times daily.    . SUMAtriptan (IMITREX) 100 MG tablet TAKE 1 TABLET AT ONSET OF MIGRAINE- MAY REPEAT ONCE IN 2 HOURS. LIMIT2/24 HOURS. AVOID DAILY USE. 9 tablet 6  . topiramate (TOPAMAX) 200 MG tablet TAKE 1 TABLET TWICE DAILY. 60 tablet 11  . verapamil (CALAN-SR) 120 MG CR tablet TAKE ONE TABLET AT BEDTIME. 30 tablet 11   No facility-administered medications prior to visit.      ROS Review of Systems  Constitutional: Positive for fatigue. Negative for activity change, appetite change, chills and unexpected weight change.  HENT: Negative for congestion, mouth sores and sinus pressure.   Eyes: Negative for visual disturbance.  Respiratory: Negative for cough and chest tightness.   Gastrointestinal: Negative for abdominal pain and nausea.  Genitourinary: Positive for urgency. Negative for difficulty urinating, frequency and vaginal pain.  Musculoskeletal: Positive for back pain. Negative for gait problem.  Skin: Negative for pallor and rash.  Neurological: Negative for dizziness, tremors, weakness, numbness and headaches.  Psychiatric/Behavioral: Negative for confusion and sleep disturbance.    Objective:  BP 110/80   Pulse 83   Wt 169 lb (76.7 kg)   SpO2 97%   BMI 28.12 kg/m   BP Readings from Last 3 Encounters:  05/30/16 110/80  02/20/16 123/78  01/30/16 116/84    Wt Readings from Last 3 Encounters:  05/30/16 169 lb (76.7 kg)  02/20/16 169 lb 3.2 oz (76.7 kg)  01/30/16 170 lb (77.1 kg)    Physical Exam  Constitutional: She appears well-developed. No distress.  HENT:  Head: Normocephalic.  Right Ear: External ear normal.  Left Ear: External ear normal.  Nose: Nose normal.  Mouth/Throat: Oropharynx is clear and moist.  Eyes: Conjunctivae are normal. Pupils are equal, round, and reactive to light.  Right eye exhibits no discharge. Left eye exhibits no discharge.  Neck: Normal range of motion. Neck supple. No JVD present. No tracheal deviation present. No thyromegaly present.  Cardiovascular: Normal rate, regular rhythm and normal heart sounds.   Pulmonary/Chest: No stridor. No respiratory distress. She has no wheezes.  Abdominal: Soft. Bowel sounds are normal. She exhibits no distension and no mass. There is no tenderness. There is no rebound and no guarding.  Musculoskeletal: She exhibits tenderness. She exhibits no edema.  Lymphadenopathy:     She has no cervical adenopathy.  Neurological: She displays normal reflexes. No cranial nerve deficit. She exhibits normal muscle tone. Coordination normal.  Skin: No rash noted. No erythema.  Psychiatric: She has a normal mood and affect. Her behavior is normal. Judgment and thought content normal.  LS tender  Lab Results  Component Value Date   WBC 6.7 11/13/2015   HGB 12.6 11/13/2015   HCT 38.4 11/13/2015   PLT 198 11/13/2015   GLUCOSE 86 11/13/2015   CHOL 213 (H) 12/21/2013   TRIG 67.0 12/21/2013   HDL 67.80 12/21/2013   LDLDIRECT 130.2 12/21/2013   ALT 27 12/21/2013   AST 29 12/21/2013   NA 140 11/13/2015   K 3.8 11/13/2015   CL 109 11/13/2015   CREATININE 0.79 11/13/2015   BUN 10 11/13/2015   CO2 21 11/13/2015   TSH 1.633 11/13/2015   HGBA1C 5.3 02/25/2012    US Breast Ltd Uni Left Inc Axilla  Result Date: 12/12/2015 CLINICAL DATA:  Recall from screening for evaluation of possible mass posterior left upper quadrant EXAM: DIGITAL DIAGNOSTIC LEFT MAMMOGRAM WITH 3D TOMOSYNTHESIS WITH CAD ULTRASOUND LEFT BREAST COMPARISON:  Previous exam(s). ACR Breast Density Category b: There are scattered areas of fibroglandular density. FINDINGS: There is a round circumscribed 7 mm mass in the posterior left upper outer quadrant. Anterior to this is another circumscribed smaller round mass. Mammographic images were processed with CAD. On physical exam, there are no palpable abnormalities. Targeted ultrasound is performed, showing a simple cyst in the 3 o'clock position of the left breast 4 cm from the nipple. It measures 8 x 6 x 8 mm. There are several tiny adjacent cysts. In the 2 o'clock position 3 cm from the nipple there is another simple cyst measuring 6 x 5 x 7 mm. There are a few internal echoes which are observed to be mobile within the cyst. There are no suspicious findings. IMPRESSION: Fibrocystic change with 2 dominant upper-outer quadrant left breast cysts. RECOMMENDATION: Return  to annual screening mammography I have discussed the findings and recommendations with the patient. Results were also provided in writing at the conclusion of the visit. If applicable, a reminder letter will be sent to the patient regarding the next appointment. BI-RADS CATEGORY  2: Benign. Electronically Signed   By: Skipper Cliche M.D.   On: 12/12/2015 10:28   Mm Diag Breast Tomo Uni Left  Result Date: 12/12/2015 CLINICAL DATA:  Recall from screening for evaluation of possible mass posterior left upper quadrant EXAM: DIGITAL DIAGNOSTIC LEFT MAMMOGRAM WITH 3D TOMOSYNTHESIS WITH CAD ULTRASOUND LEFT BREAST COMPARISON:  Previous exam(s). ACR Breast Density Category b: There are scattered areas of fibroglandular density. FINDINGS: There is a round circumscribed 7 mm mass in the posterior left upper outer quadrant. Anterior to this is another circumscribed smaller round mass. Mammographic images were processed with CAD. On physical exam, there are no palpable abnormalities. Targeted ultrasound is performed, showing a simple cyst in the 3 o'clock position  of the left breast 4 cm from the nipple. It measures 8 x 6 x 8 mm. There are several tiny adjacent cysts. In the 2 o'clock position 3 cm from the nipple there is another simple cyst measuring 6 x 5 x 7 mm. There are a few internal echoes which are observed to be mobile within the cyst. There are no suspicious findings. IMPRESSION: Fibrocystic change with 2 dominant upper-outer quadrant left breast cysts. RECOMMENDATION: Return to annual screening mammography I have discussed the findings and recommendations with the patient. Results were also provided in writing at the conclusion of the visit. If applicable, a reminder letter will be sent to the patient regarding the next appointment. BI-RADS CATEGORY  2: Benign. Electronically Signed   By: Skipper Cliche M.D.   On: 12/12/2015 10:28    Assessment & Plan:   Diagnoses and all orders for this visit:  Low back  pain with sciatica, sciatica laterality unspecified, unspecified back pain laterality, unspecified chronicity  SYRINGOMYELIA  Other orders -     oxyCODONE (ROXICODONE) 15 MG immediate release tablet; Take 1 tablet (15 mg total) by mouth every 6 (six) hours as needed for pain. Please fill on or after 05/09/16   I am having Beth Huffman maintain her cholecalciferol, Multiple Vitamin (MULTIVITAMIN PO), hydrochlorothiazide, estradiol, topiramate, naproxen, pantoprazole, verapamil, nortriptyline, SUMAtriptan, cyclobenzaprine, ondansetron, oxyCODONE, and Riboflavin (VITAMIN B-2 PO).  No orders of the defined types were placed in this encounter.    Follow-up: No Follow-up on file.  Walker Kehr, MD

## 2016-05-30 NOTE — Assessment & Plan Note (Signed)
Dr Yan Dr Freedman at [facility] 

## 2016-05-30 NOTE — Assessment & Plan Note (Signed)
Surgery is pending 

## 2016-06-03 ENCOUNTER — Telehealth: Payer: Self-pay | Admitting: Internal Medicine

## 2016-06-03 NOTE — Telephone Encounter (Signed)
Patient never came to pick up oxycodone script dated 04/10/2016. Disposing of it

## 2016-06-04 ENCOUNTER — Inpatient Hospital Stay (HOSPITAL_COMMUNITY)
Admission: RE | Admit: 2016-06-04 | Discharge: 2016-06-04 | Disposition: A | Discharge status: Home / Self Care | Payer: BLUE CROSS/BLUE SHIELD | Source: Ambulatory Visit

## 2016-07-01 ENCOUNTER — Other Ambulatory Visit: Payer: Self-pay | Admitting: Obstetrics & Gynecology

## 2016-07-09 ENCOUNTER — Other Ambulatory Visit: Payer: Self-pay | Admitting: Internal Medicine

## 2016-07-11 NOTE — Patient Instructions (Signed)
Your procedure is scheduled on:  Thursday, Sept. 28, 2017  Enter through the Main Entrance of [facility] at:  6:00 AM  Pick up the phone at the desk and dial [phone].  Call this number if you have problems the morning of surgery: [phone].  Remember: Do NOT eat food or drink after:  Midnight Wednesday  Take these medicines the morning of surgery with a SIP OF WATER:  Hydrochlorothiazide, Pantoprazole, Topiramate  Do NOT wear jewelry (body piercing), metal hair clips/bobby pins, make-up, or nail polish. Do NOT wear lotions, powders, or perfumes.  You may wear deodorant. Do NOT shave for 48 hours prior to surgery. Do NOT bring valuables to the hospital. Contacts, dentures, or bridgework may not be worn into surgery.  Leave suitcase in car.  After surgery it may be brought to your room.  For patients admitted to the hospital, checkout time is 11:00 AM the day of discharge.

## 2016-07-14 ENCOUNTER — Telehealth: Payer: Self-pay | Admitting: Nurse Practitioner

## 2016-07-14 ENCOUNTER — Encounter (HOSPITAL_COMMUNITY)
Admission: RE | Admit: 2016-07-14 | Discharge: 2016-07-14 | Disposition: A | Discharge status: Home / Self Care | Payer: BLUE CROSS/BLUE SHIELD | Source: Ambulatory Visit | Attending: Obstetrics & Gynecology | Admitting: Obstetrics & Gynecology

## 2016-07-14 ENCOUNTER — Encounter (HOSPITAL_COMMUNITY): Payer: Self-pay

## 2016-07-14 DIAGNOSIS — Z01812 Encounter for preprocedural laboratory examination: Secondary | ICD-10-CM | POA: Insufficient documentation

## 2016-07-14 LAB — COMPREHENSIVE METABOLIC PANEL
ALK PHOS: 76 U/L (ref 38–126)
ALT: 26 U/L (ref 14–54)
ANION GAP: 4 — AB (ref 5–15)
AST: 29 U/L (ref 15–41)
Albumin: 4 g/dL (ref 3.5–5.0)
BILIRUBIN TOTAL: 0.5 mg/dL (ref 0.3–1.2)
BUN: 12 mg/dL (ref 6–20)
CALCIUM: 8.7 mg/dL — AB (ref 8.9–10.3)
CO2: 24 mmol/L (ref 22–32)
CREATININE: 0.88 mg/dL (ref 0.44–1.00)
Chloride: 110 mmol/L (ref 101–111)
GFR calc non Af Amer: >60 mL/min (ref >60)
Glucose, Bld: 79 mg/dL (ref 65–99)
Potassium: 3.6 mmol/L (ref 3.5–5.1)
Sodium: 138 mmol/L (ref 135–145)
TOTAL PROTEIN: 6.4 g/dL — AB (ref 6.5–8.1)

## 2016-07-14 LAB — ABO/RH: ABO/RH(D): A POS

## 2016-07-14 LAB — CBC
HEMATOCRIT: 35 % — AB (ref 36.0–46.0)
HEMOGLOBIN: 11.7 g/dL — AB (ref 12.0–15.0)
MCH: 30.5 pg (ref 26.0–34.0)
MCHC: 33.4 g/dL (ref 30.0–36.0)
MCV: 91.1 fL (ref 78.0–100.0)
Platelets: 192 10*3/uL (ref 150–400)
RBC: 3.84 MIL/uL — AB (ref 3.87–5.11)
RDW: 13.2 % (ref 11.5–15.5)
WBC: 5.4 10*3/uL (ref 4.0–10.5)

## 2016-07-14 LAB — TYPE AND SCREEN
ABO/RH(D): A POS
ANTIBODY SCREEN: NEGATIVE

## 2016-07-14 NOTE — Pre-Procedure Instructions (Signed)
Dr. Dellis Filbert made aware of Beth Huffman's rare brain condition and her inability to lie flat.  Dr. Dellis Filbert requests Beth Huffman has a neuro consultation prior to proceeding with surgery and follow up at Dr. Assunta Curtis office once neuro consultation is completed.

## 2016-07-14 NOTE — Telephone Encounter (Signed)
Pt called in requesting medical clearance for bladder prolapse surgery. Please call [phone]

## 2016-07-15 NOTE — Telephone Encounter (Signed)
LMVM for pt that returned call.  °

## 2016-07-15 NOTE — Telephone Encounter (Signed)
Spoke to pt.  She is due to have in 4 days time a bladder prolapse surgery.  Dr. Dellis Filbert at [facility]. They are requesting medical clearance from [place] relating to her syringomeylia.  Had decompression done at [facility] by Dr. Friedman 82yrs ago.  We are seeing her for headaches.  She gets dry eyes and was told to keep head elevated but for surgery she will need to lie flat (deep trendelenburg).  I relayed to pt that we have seen her for headaches.  She may need to check with Dr. Tommi Rumps since he has done decompression and 5-69yrs ago a spine to brain dye test.  She stated she did not want to have to cancel this surgery.  S he wanted to ask CM/NP since she has seen the last 2 times.  I told her I would forward. Last seen 01/2016.

## 2016-07-16 NOTE — Telephone Encounter (Signed)
I spoke to pt and relayed the message from Dr. Krista Blue.  Ok to proceed with elective surgery.  She spoke to there office and they are to send a fax (medical release form).

## 2016-07-16 NOTE — Telephone Encounter (Signed)
Received fax for medical release. Placed on Dr. Rhea Belton desk.

## 2016-07-16 NOTE — Telephone Encounter (Signed)
I have reviewed multiple previous MRIs, most recent MRI of the brain in 2009, MRI of the cervical, thoracic, lumbar spine in 2009, evidence of chronic changes, posterior occipital decompression, continued evidence of posterior arachnoid cyst, syrinx at the lower cervical and different thoracic levels,  There was no evidence of compression, it is okay to proceed with elective surgery.

## 2016-07-20 ENCOUNTER — Other Ambulatory Visit: Payer: Self-pay | Admitting: Internal Medicine

## 2016-07-21 ENCOUNTER — Other Ambulatory Visit: Payer: Self-pay | Admitting: Obstetrics & Gynecology

## 2016-07-23 MED ORDER — CEFAZOLIN SODIUM-DEXTROSE 2-4 GM/100ML-% IV SOLN
2.0000 g | INTRAVENOUS | Status: AC
  Administered 2016-07-24 (×2): 2 g via INTRAVENOUS

## 2016-07-24 ENCOUNTER — Ambulatory Visit (HOSPITAL_COMMUNITY): Payer: BLUE CROSS/BLUE SHIELD | Admitting: Anesthesiology

## 2016-07-24 ENCOUNTER — Encounter (HOSPITAL_COMMUNITY): Payer: Self-pay

## 2016-07-24 ENCOUNTER — Ambulatory Visit (HOSPITAL_COMMUNITY)
Admission: RE | Admit: 2016-07-24 | Discharge: 2016-07-25 | Disposition: A | Discharge status: Home / Self Care | Payer: BLUE CROSS/BLUE SHIELD | Source: Ambulatory Visit | Attending: Obstetrics & Gynecology | Admitting: Obstetrics & Gynecology

## 2016-07-24 ENCOUNTER — Encounter (HOSPITAL_COMMUNITY)
Admission: RE | Disposition: A | Discharge status: Home / Self Care | Payer: Self-pay | Source: Ambulatory Visit | Attending: Obstetrics & Gynecology

## 2016-07-24 DIAGNOSIS — N838 Other noninflammatory disorders of ovary, fallopian tube and broad ligament: Secondary | ICD-10-CM | POA: Insufficient documentation

## 2016-07-24 DIAGNOSIS — K219 Gastro-esophageal reflux disease without esophagitis: Secondary | ICD-10-CM | POA: Insufficient documentation

## 2016-07-24 DIAGNOSIS — N993 Prolapse of vaginal vault after hysterectomy: Secondary | ICD-10-CM | POA: Diagnosis not present

## 2016-07-24 DIAGNOSIS — N8111 Cystocele, midline: Secondary | ICD-10-CM | POA: Diagnosis not present

## 2016-07-24 DIAGNOSIS — R32 Unspecified urinary incontinence: Secondary | ICD-10-CM | POA: Diagnosis not present

## 2016-07-24 DIAGNOSIS — I10 Essential (primary) hypertension: Secondary | ICD-10-CM | POA: Insufficient documentation

## 2016-07-24 DIAGNOSIS — G950 Syringomyelia and syringobulbia: Secondary | ICD-10-CM | POA: Diagnosis not present

## 2016-07-24 DIAGNOSIS — N816 Rectocele: Secondary | ICD-10-CM | POA: Diagnosis not present

## 2016-07-24 DIAGNOSIS — M545 Low back pain: Secondary | ICD-10-CM | POA: Diagnosis not present

## 2016-07-24 DIAGNOSIS — N393 Stress incontinence (female) (male): Secondary | ICD-10-CM | POA: Diagnosis not present

## 2016-07-24 DIAGNOSIS — N8110 Cystocele, unspecified: Secondary | ICD-10-CM | POA: Diagnosis not present

## 2016-07-24 DIAGNOSIS — N815 Vaginal enterocele: Secondary | ICD-10-CM | POA: Diagnosis not present

## 2016-07-24 DIAGNOSIS — Z9889 Other specified postprocedural states: Secondary | ICD-10-CM

## 2016-07-24 HISTORY — PX: BLADDER SUSPENSION: SHX72

## 2016-07-24 HISTORY — PX: ROBOTIC ASSISTED LAPAROSCOPIC SACROCOLPOPEXY: SHX5388

## 2016-07-24 HISTORY — DX: Other specified postprocedural states: Z98.890

## 2016-07-24 HISTORY — PX: ANTERIOR AND POSTERIOR REPAIR: SHX5121

## 2016-07-24 HISTORY — PX: CYSTOSCOPY: SHX5120

## 2016-07-24 SURGERY — ROBOTIC ASSISTED LAPAROSCOPIC SACROCOLPOPEXY
Anesthesia: General | Site: Vagina

## 2016-07-24 MED ORDER — PROPOFOL 10 MG/ML IV BOLUS
INTRAVENOUS | Status: AC
  Filled 2016-07-24: qty 20

## 2016-07-24 MED ORDER — SODIUM CHLORIDE 0.9 % IJ SOLN
INTRAMUSCULAR | Status: AC
  Filled 2016-07-24: qty 50

## 2016-07-24 MED ORDER — ESTRADIOL 0.1 MG/GM VA CREA
TOPICAL_CREAM | VAGINAL | Status: DC | PRN
  Administered 2016-07-24: 1 via VAGINAL

## 2016-07-24 MED ORDER — HYDROMORPHONE HCL 1 MG/ML IJ SOLN
1.0000 mg | INTRAMUSCULAR | Status: DC | PRN
  Administered 2016-07-24 – 2016-07-25 (×5): 1 mg via INTRAVENOUS
  Filled 2016-07-24 (×5): qty 1

## 2016-07-24 MED ORDER — PROPOFOL 10 MG/ML IV BOLUS
INTRAVENOUS | Status: DC | PRN
Start: 2016-07-24 — End: 2016-07-24
  Administered 2016-07-24: 150 mg via INTRAVENOUS

## 2016-07-24 MED ORDER — ONDANSETRON HCL 4 MG/2ML IJ SOLN: 4.0000 mg | Freq: Once | INTRAMUSCULAR | Status: DC | PRN

## 2016-07-24 MED ORDER — HYDROMORPHONE HCL 1 MG/ML IJ SOLN
INTRAMUSCULAR | Status: AC
  Filled 2016-07-24: qty 1

## 2016-07-24 MED ORDER — GLYCOPYRROLATE 0.2 MG/ML IJ SOLN
INTRAMUSCULAR | Status: AC
  Filled 2016-07-24: qty 2

## 2016-07-24 MED ORDER — IBUPROFEN 600 MG PO TABS
600.0000 mg | ORAL_TABLET | Freq: Four times a day (QID) | ORAL | Status: DC | PRN
  Administered 2016-07-24 – 2016-07-25 (×3): 600 mg via ORAL
  Filled 2016-07-24 (×3): qty 1

## 2016-07-24 MED ORDER — SUMATRIPTAN SUCCINATE 100 MG PO TABS
100.0000 mg | ORAL_TABLET | Freq: Once | ORAL | Status: AC
  Administered 2016-07-24: 100 mg via ORAL
  Filled 2016-07-24: qty 1

## 2016-07-24 MED ORDER — OXYCODONE-ACETAMINOPHEN 5-325 MG PO TABS
1.0000 | ORAL_TABLET | ORAL | Status: DC | PRN
  Administered 2016-07-25 (×3): 2 via ORAL
  Administered 2016-07-25: 1 via ORAL
  Filled 2016-07-24 (×3): qty 2
  Filled 2016-07-24: qty 1

## 2016-07-24 MED ORDER — DEXAMETHASONE SODIUM PHOSPHATE 4 MG/ML IJ SOLN
INTRAMUSCULAR | Status: AC
  Filled 2016-07-24: qty 1

## 2016-07-24 MED ORDER — SCOPOLAMINE 1 MG/3DAYS TD PT72
1.0000 | MEDICATED_PATCH | Freq: Once | TRANSDERMAL | Status: DC
Start: 2016-07-24 — End: 2016-07-24
  Administered 2016-07-24: 1.5 mg via TRANSDERMAL

## 2016-07-24 MED ORDER — LACTATED RINGERS IV SOLN
INTRAVENOUS | Status: DC
  Administered 2016-07-25: 11:00:00 via INTRAVENOUS

## 2016-07-24 MED ORDER — ESTRADIOL 0.1 MG/GM VA CREA
TOPICAL_CREAM | VAGINAL | Status: AC
  Filled 2016-07-24: qty 42.5

## 2016-07-24 MED ORDER — ROCURONIUM BROMIDE 100 MG/10ML IV SOLN
INTRAVENOUS | Status: AC
  Filled 2016-07-24: qty 1

## 2016-07-24 MED ORDER — LIDOCAINE HCL 1 % IJ SOLN
INTRAMUSCULAR | Status: AC
  Filled 2016-07-24: qty 20

## 2016-07-24 MED ORDER — SUGAMMADEX SODIUM 200 MG/2ML IV SOLN
INTRAVENOUS | Status: AC
  Filled 2016-07-24: qty 2

## 2016-07-24 MED ORDER — SUGAMMADEX SODIUM 200 MG/2ML IV SOLN
INTRAVENOUS | Status: DC | PRN
  Administered 2016-07-24: 200 mg via INTRAVENOUS

## 2016-07-24 MED ORDER — CEFAZOLIN SODIUM-DEXTROSE 2-4 GM/100ML-% IV SOLN
INTRAVENOUS | Status: AC
  Filled 2016-07-24: qty 100

## 2016-07-24 MED ORDER — ACETAMINOPHEN 10 MG/ML IV SOLN
1000.0000 mg | Freq: Once | INTRAVENOUS | Status: AC
  Administered 2016-07-24: 1000 mg via INTRAVENOUS
  Filled 2016-07-24: qty 100

## 2016-07-24 MED ORDER — BUPIVACAINE HCL (PF) 0.25 % IJ SOLN
INTRAMUSCULAR | Status: AC
  Filled 2016-07-24: qty 30

## 2016-07-24 MED ORDER — PHENAZOPYRIDINE HCL 200 MG PO TABS
200.0000 mg | ORAL_TABLET | Freq: Once | ORAL | Status: AC
  Administered 2016-07-24: 200 mg via ORAL
  Filled 2016-07-24: qty 1

## 2016-07-24 MED ORDER — PHENYLEPHRINE 40 MCG/ML (10ML) SYRINGE FOR IV PUSH (FOR BLOOD PRESSURE SUPPORT)
PREFILLED_SYRINGE | INTRAVENOUS | Status: AC
  Filled 2016-07-24: qty 10

## 2016-07-24 MED ORDER — SODIUM CHLORIDE 0.9 % IV SOLN
INTRAVENOUS | Status: DC | PRN
  Administered 2016-07-24: 11:00:00

## 2016-07-24 MED ORDER — ROCURONIUM BROMIDE 100 MG/10ML IV SOLN
INTRAVENOUS | Status: DC | PRN
  Administered 2016-07-24: 50 mg via INTRAVENOUS
  Administered 2016-07-24 (×4): 10 mg via INTRAVENOUS
  Administered 2016-07-24: 20 mg via INTRAVENOUS
  Administered 2016-07-24: 5 mg via INTRAVENOUS
  Administered 2016-07-24 (×2): 10 mg via INTRAVENOUS
  Administered 2016-07-24: 15 mg via INTRAVENOUS

## 2016-07-24 MED ORDER — SCOPOLAMINE 1 MG/3DAYS TD PT72
MEDICATED_PATCH | TRANSDERMAL | Status: AC
  Administered 2016-07-24: 1.5 mg via TRANSDERMAL
  Filled 2016-07-24: qty 1

## 2016-07-24 MED ORDER — HYDROMORPHONE HCL 1 MG/ML IJ SOLN
0.2500 mg | INTRAMUSCULAR | Status: DC | PRN
  Administered 2016-07-24 (×2): 0.5 mg via INTRAVENOUS

## 2016-07-24 MED ORDER — PANTOPRAZOLE SODIUM 40 MG PO TBEC
40.0000 mg | DELAYED_RELEASE_TABLET | Freq: Every day | ORAL | Status: DC
  Administered 2016-07-25: 40 mg via ORAL
  Filled 2016-07-24: qty 1

## 2016-07-24 MED ORDER — SODIUM CHLORIDE 0.9 % IJ SOLN
INTRAMUSCULAR | Status: AC
  Filled 2016-07-24: qty 10

## 2016-07-24 MED ORDER — HYDROMORPHONE HCL 1 MG/ML IJ SOLN
INTRAMUSCULAR | Status: DC | PRN
  Administered 2016-07-24 (×2): 1 mg via INTRAVENOUS

## 2016-07-24 MED ORDER — NORTRIPTYLINE HCL 25 MG PO CAPS
25.0000 mg | ORAL_CAPSULE | Freq: Every day | ORAL | Status: DC
  Administered 2016-07-24: 25 mg via ORAL
  Filled 2016-07-24: qty 1

## 2016-07-24 MED ORDER — NEOSTIGMINE METHYLSULFATE 10 MG/10ML IV SOLN
INTRAVENOUS | Status: AC
  Filled 2016-07-24: qty 1

## 2016-07-24 MED ORDER — VERAPAMIL HCL ER 120 MG PO TBCR
60.0000 mg | EXTENDED_RELEASE_TABLET | Freq: Once | ORAL | Status: AC
  Administered 2016-07-24: 60 mg via ORAL
  Filled 2016-07-24: qty 0.5

## 2016-07-24 MED ORDER — BUPIVACAINE HCL (PF) 0.25 % IJ SOLN
INTRAMUSCULAR | Status: DC | PRN
  Administered 2016-07-24: 14 mL

## 2016-07-24 MED ORDER — LIDOCAINE HCL 1 % IJ SOLN
INTRAMUSCULAR | Status: DC | PRN
  Administered 2016-07-24: 20 mL

## 2016-07-24 MED ORDER — HYDROMORPHONE HCL 1 MG/ML IJ SOLN
INTRAMUSCULAR | Status: AC
  Administered 2016-07-24: 0.5 mg via INTRAVENOUS
  Filled 2016-07-24: qty 1

## 2016-07-24 MED ORDER — MIDAZOLAM HCL 2 MG/2ML IJ SOLN
INTRAMUSCULAR | Status: AC
  Filled 2016-07-24: qty 2

## 2016-07-24 MED ORDER — MIDAZOLAM HCL 5 MG/5ML IJ SOLN
INTRAMUSCULAR | Status: DC | PRN
  Administered 2016-07-24: 2 mg via INTRAVENOUS

## 2016-07-24 MED ORDER — FENTANYL CITRATE (PF) 250 MCG/5ML IJ SOLN
INTRAMUSCULAR | Status: AC
  Filled 2016-07-24: qty 5

## 2016-07-24 MED ORDER — LIDOCAINE HCL (CARDIAC) 20 MG/ML IV SOLN
INTRAVENOUS | Status: DC | PRN
  Administered 2016-07-24: 80 mg via INTRAVENOUS

## 2016-07-24 MED ORDER — ONDANSETRON HCL 4 MG/2ML IJ SOLN
INTRAMUSCULAR | Status: AC
  Filled 2016-07-24: qty 2

## 2016-07-24 MED ORDER — PHENYLEPHRINE HCL 10 MG/ML IJ SOLN
INTRAMUSCULAR | Status: DC | PRN
  Administered 2016-07-24 (×2): 40 ug via INTRAVENOUS

## 2016-07-24 MED ORDER — BUPIVACAINE-EPINEPHRINE (PF) 0.5% -1:200000 IJ SOLN
INTRAMUSCULAR | Status: AC
  Filled 2016-07-24: qty 30

## 2016-07-24 MED ORDER — ONDANSETRON HCL 4 MG PO TABS: 4.0000 mg | ORAL_TABLET | Freq: Four times a day (QID) | ORAL | Status: DC | PRN

## 2016-07-24 MED ORDER — DEXAMETHASONE SODIUM PHOSPHATE 4 MG/ML IJ SOLN
INTRAMUSCULAR | Status: DC | PRN
  Administered 2016-07-24: 4 mg via INTRAVENOUS

## 2016-07-24 MED ORDER — ONDANSETRON HCL 4 MG/2ML IJ SOLN
4.0000 mg | Freq: Four times a day (QID) | INTRAMUSCULAR | Status: DC | PRN
  Administered 2016-07-24: 4 mg via INTRAVENOUS
  Filled 2016-07-24: qty 2

## 2016-07-24 MED ORDER — ONDANSETRON HCL 4 MG/2ML IJ SOLN
INTRAMUSCULAR | Status: DC | PRN
  Administered 2016-07-24: 4 mg via INTRAVENOUS

## 2016-07-24 MED ORDER — STERILE WATER FOR IRRIGATION IR SOLN
Status: DC | PRN
  Administered 2016-07-24 (×2): 1000 mL via INTRAVESICAL

## 2016-07-24 MED ORDER — ARTIFICIAL TEARS OP OINT
TOPICAL_OINTMENT | OPHTHALMIC | Status: DC | PRN
  Administered 2016-07-24: 1 via OPHTHALMIC

## 2016-07-24 MED ORDER — LIDOCAINE HCL (CARDIAC) 20 MG/ML IV SOLN
INTRAVENOUS | Status: AC
  Filled 2016-07-24: qty 5

## 2016-07-24 MED ORDER — BUPIVACAINE-EPINEPHRINE 0.5% -1:200000 IJ SOLN
INTRAMUSCULAR | Status: DC | PRN
  Administered 2016-07-24: 13 mL

## 2016-07-24 MED ORDER — METHYLENE BLUE 0.5 % INJ SOLN
INTRAVENOUS | Status: AC
  Filled 2016-07-24: qty 10

## 2016-07-24 MED ORDER — FENTANYL CITRATE (PF) 100 MCG/2ML IJ SOLN
INTRAMUSCULAR | Status: DC | PRN
  Administered 2016-07-24 (×2): 50 ug via INTRAVENOUS
  Administered 2016-07-24: 100 ug via INTRAVENOUS
  Administered 2016-07-24: 50 ug via INTRAVENOUS

## 2016-07-24 MED ORDER — TOPIRAMATE 100 MG PO TABS
200.0000 mg | ORAL_TABLET | Freq: Two times a day (BID) | ORAL | Status: DC
  Administered 2016-07-24 – 2016-07-25 (×2): 200 mg via ORAL
  Filled 2016-07-24 (×4): qty 2

## 2016-07-24 MED ORDER — ROPIVACAINE HCL 5 MG/ML IJ SOLN
INTRAMUSCULAR | Status: AC
  Filled 2016-07-24: qty 30

## 2016-07-24 MED ORDER — LACTATED RINGERS IV SOLN
INTRAVENOUS | Status: DC
  Administered 2016-07-24: 125 mL/h via INTRAVENOUS
  Administered 2016-07-24 (×2): via INTRAVENOUS

## 2016-07-24 SURGICAL SUPPLY — 87 items
BARRIER ADHS 3X4 INTERCEED (GAUZE/BANDAGES/DRESSINGS) IMPLANT
BLADE SURG 15 STRL LF C SS BP (BLADE) ×6 IMPLANT
BLADE SURG 15 STRL SS (BLADE) ×2
CANISTER SUCT 3000ML (MISCELLANEOUS) ×4 IMPLANT
CATH FOLEY 2WAY SLVR  5CC 18FR (CATHETERS) ×1
CATH FOLEY 2WAY SLVR 5CC 18FR (CATHETERS) ×3 IMPLANT
CLOTH BEACON ORANGE TIMEOUT ST (SAFETY) ×4 IMPLANT
CONT PATH 16OZ SNAP LID 3702 (MISCELLANEOUS) IMPLANT
COVER TIP SHEARS 8 DVNC (MISCELLANEOUS) ×3 IMPLANT
COVER TIP SHEARS 8MM DA VINCI (MISCELLANEOUS) ×1
DECANTER SPIKE VIAL GLASS SM (MISCELLANEOUS) ×20 IMPLANT
DEFOGGER SCOPE WARMER CLEARIFY (MISCELLANEOUS) ×4 IMPLANT
DEVICE CAPIO SLIM SINGLE (INSTRUMENTS) IMPLANT
DRAPE STERI URO 9X17 APER PCH (DRAPES) ×4 IMPLANT
DRSG OPSITE POSTOP 3X4 (GAUZE/BANDAGES/DRESSINGS) ×4 IMPLANT
DURAPREP 26ML APPLICATOR (WOUND CARE) ×4 IMPLANT
GAUZE PACKING 1 X5 YD ST (GAUZE/BANDAGES/DRESSINGS) ×4 IMPLANT
GAUZE PACKING IODOFORM 2 (PACKING) IMPLANT
GLOVE BIO SURGEON STRL SZ 6.5 (GLOVE) ×12 IMPLANT
GLOVE BIOGEL PI IND STRL 6.5 (GLOVE) ×3 IMPLANT
GLOVE BIOGEL PI IND STRL 7.0 (GLOVE) ×15 IMPLANT
GLOVE BIOGEL PI INDICATOR 6.5 (GLOVE) ×1
GLOVE BIOGEL PI INDICATOR 7.0 (GLOVE) ×5
GOWN STRL REUS W/TWL LRG LVL3 (GOWN DISPOSABLE) ×16 IMPLANT
KIT ACCESSORY DA VINCI DISP (KITS) ×1
KIT ACCESSORY DVNC DISP (KITS) ×3 IMPLANT
LEGGING LITHOTOMY PAIR STRL (DRAPES) ×4 IMPLANT
LIQUID BAND (GAUZE/BANDAGES/DRESSINGS) ×4 IMPLANT
MERIDIAN VAGINAL POSITION SYS (MISCELLANEOUS) IMPLANT
MESH ARTISYN Y (Sling) ×4 IMPLANT
NEEDLE HYPO 22GX1.5 SAFETY (NEEDLE) ×4 IMPLANT
NEEDLE SPNL 20GX3.5 QUINCKE YW (NEEDLE) ×8 IMPLANT
NEEDLE SPNL 22GX3.5 QUINCKE BK (NEEDLE) ×4 IMPLANT
NS IRRIG 1000ML POUR BTL (IV SOLUTION) ×4 IMPLANT
OCCLUDER COLPOPNEUMO (BALLOONS) ×4 IMPLANT
PACK ROBOT WH (CUSTOM PROCEDURE TRAY) ×4 IMPLANT
PACK ROBOTIC GOWN (GOWN DISPOSABLE) ×4 IMPLANT
PACK TRENDGUARD 450 HYBRID PRO (MISCELLANEOUS) ×3 IMPLANT
PACK TRENDGUARD 600 HYBRD PROC (MISCELLANEOUS) IMPLANT
PACK VAGINAL WOMENS (CUSTOM PROCEDURE TRAY) IMPLANT
PAD PREP 24X48 CUFFED NSTRL (MISCELLANEOUS) ×4 IMPLANT
PLUG CATH AND CAP STER (CATHETERS) ×4 IMPLANT
POUCH SPECIMEN RETRIEVAL 10MM (ENDOMECHANICALS) ×4 IMPLANT
PROTECTOR NERVE ULNAR (MISCELLANEOUS) ×8 IMPLANT
SET CYSTO W/LG BORE CLAMP LF (SET/KITS/TRAYS/PACK) ×4 IMPLANT
SET TRI-LUMEN FLTR TB AIRSEAL (TUBING) ×4 IMPLANT
SLING TVT EXACT (Sling) ×4 IMPLANT
SUT CAPIO ETHIBPND (SUTURE) IMPLANT
SUT ETHIBOND 0 (SUTURE) IMPLANT
SUT GORETEX NAB #0 THX26 36IN (SUTURE) ×24 IMPLANT
SUT VIC AB 0 CT1 27 (SUTURE)
SUT VIC AB 0 CT1 27XBRD ANBCTR (SUTURE) IMPLANT
SUT VIC AB 0 CT1 27XBRD ANTBC (SUTURE) IMPLANT
SUT VIC AB 2-0 CT1 27 (SUTURE) ×1
SUT VIC AB 2-0 CT1 TAPERPNT 27 (SUTURE) ×3 IMPLANT
SUT VIC AB 2-0 CT2 27 (SUTURE) IMPLANT
SUT VIC AB 2-0 SH 27 (SUTURE) ×4
SUT VIC AB 2-0 SH 27XBRD (SUTURE) ×12 IMPLANT
SUT VIC AB 2-0 UR5 27 (SUTURE) IMPLANT
SUT VIC AB 3-0 SH 27 (SUTURE)
SUT VIC AB 3-0 SH 27X BRD (SUTURE) IMPLANT
SUT VIC AB 4-0 PS2 27 (SUTURE) ×8 IMPLANT
SUT VIC AB 4-0 SH 27 (SUTURE) ×1
SUT VIC AB 4-0 SH 27XANBCTRL (SUTURE) ×3 IMPLANT
SUT VICRYL 0 UR6 27IN ABS (SUTURE) ×4 IMPLANT
SUT VICRYL 2 0 18  UND BR (SUTURE)
SUT VICRYL 2 0 18 UND BR (SUTURE) IMPLANT
SUT VLOC 180 2-0 6IN GS21 (SUTURE) ×4 IMPLANT
SUT VLOC 180 2-0 9IN GS21 (SUTURE) IMPLANT
SYR 20CC LL (SYRINGE) ×4 IMPLANT
SYR 50ML LL SCALE MARK (SYRINGE) ×4 IMPLANT
SYRINGE 10CC LL (SYRINGE) ×4 IMPLANT
SYSTEM CONVERTIBLE TROCAR (TROCAR) ×4 IMPLANT
TIP UTERINE 5.1X6CM LAV DISP (MISCELLANEOUS) IMPLANT
TIP UTERINE 6.7X10CM GRN DISP (MISCELLANEOUS) IMPLANT
TIP UTERINE 6.7X6CM WHT DISP (MISCELLANEOUS) IMPLANT
TIP UTERINE 6.7X8CM BLUE DISP (MISCELLANEOUS) IMPLANT
TOWEL OR 17X24 6PK STRL BLUE (TOWEL DISPOSABLE) ×12 IMPLANT
TRAY FOLEY CATH SILVER 14FR (SET/KITS/TRAYS/PACK) ×4 IMPLANT
TRAY FOLEY CATH SILVER 16FR (SET/KITS/TRAYS/PACK) IMPLANT
TRENDGUARD 450 HYBRID PRO PACK (MISCELLANEOUS) ×4
TRENDGUARD 600 HYBRID PROC PK (MISCELLANEOUS)
TROCAR 12M 150ML BLUNT (TROCAR) ×4 IMPLANT
TROCAR DISP BLADELESS 8 DVNC (TROCAR) ×3 IMPLANT
TROCAR DISP BLADELESS 8MM (TROCAR) ×1
TROCAR PORT AIRSEAL 5X120 (TROCAR) ×4 IMPLANT
WATER STERILE IRR 1000ML POUR (IV SOLUTION) ×4 IMPLANT

## 2016-07-24 NOTE — Transfer of Care (Signed)
Immediate Anesthesia Transfer of Care Note  Patient: Beth Huffman  Procedure(s) Performed: Procedure(s): ROBOTIC ASSISTED LAPAROSCOPIC SACROCOLPOPEXY WITH PERINEOPLASTY (N/A) Possible ANTERIOR (CYSTOCELE) repair, perineoplasty (N/A) TRANSVAGINAL TAPE (TVT) PROCEDURE (N/A) CYSTOSCOPY (N/A)  Patient Location: PACU  Anesthesia Type:General  Level of Consciousness: awake, alert  and oriented  Airway & Oxygen Therapy: Patient Spontanous Breathing and Patient connected to nasal cannula oxygen  Post-op Assessment: Report given to RN and Post -op Vital signs reviewed and stable  Post vital signs: Reviewed and stable  Last Vitals:  Vitals:   07/24/16 0626  BP: 123/80  Pulse: 84  Resp: 20  Temp: 36.6 C    Last Pain:  Vitals:   07/24/16 0626  TempSrc: Oral  PainSc: 4       Patients Stated Pain Goal: 3 ([id number])  Complications: No apparent anesthesia complications

## 2016-07-24 NOTE — H&P (Addendum)
Beth Huffman is an 55 y.o. female  G2P2  RP:  Colpocele with Cysto-Rectocele.  Pertinent Gynecological History:  Blood transfusions: none Sexually transmitted diseases: no past history Previous GYN Procedures:  Hysterectomy in 1990's Last mammogram: wnl 11/2015 Last pap:  wnl 11/2015 OB History: G2P2   Menstrual History:  No LMP recorded. Patient is postmenopausal.    Past Medical History:  Diagnosis Date  . Abnormal weight gain   . GERD (gastroesophageal reflux disease)   . Hypertension   . LBP (low back pain)   . Migraines   . Pyelonephritis 2008  . Stress 2009  . Syringobulbia ([place])   . Syringomyelia ([place])   . Tachycardia   . UTI (lower urinary tract infection)     Past Surgical History:  Procedure Laterality Date  . ABDOMINAL HYSTERECTOMY    . COLONOSCOPY    . intracranial decompression surgery  2006  . TUBAL LIGATION      Family History  Problem Relation Age of Onset  . COPD Mother   . Peripheral vascular disease Mother   . Hypertension Mother   . Heart disease Mother   . Hypertension Father   . Anxiety disorder Other   . Heart attack Maternal Grandmother   . Stroke Maternal Grandfather   . Stroke Paternal Grandfather     Social History:  reports that she has never smoked. She has never used smokeless tobacco. She reports that she does not drink alcohol or use drugs.  Allergies:  Allergies  Allergen Reactions  . Atenolol     Wt gain  . Cefuroxime Axetil     REACTION: nausea - but tolerates PCN  . Codeine Sulfate Nausea And Vomiting and Nausea Only  . Fentanyl Nausea And Vomiting    REACTION: bad reaction  . Nitrofurantoin     REACTION: HA  . Pregabalin     REACTION: cp  . Rapaflo [Silodosin]     Orthostatic BP drop, near syncope   . Tramadol Hcl Nausea And Vomiting    REACTION: sick    Prescriptions Prior to Admission  Medication Sig Dispense Refill Last Dose  . cholecalciferol (VITAMIN D) 1000 UNITS tablet Take 1,000 Units by  mouth daily.     07/23/2016 at Unknown time  . estradiol (CLIMARA - DOSED IN MG/24 HR) 0.075 mg/24hr patch Place 0.075 mg onto the skin once a week. PATCH CHANGE ON TUESDAYS   07/23/2016 at Unknown time  . hydrochlorothiazide (MICROZIDE) 12.5 MG capsule Take 1 capsule (12.5 mg total) by mouth daily. 30 capsule 3 Taking  . ibuprofen (ADVIL,MOTRIN) 600 MG tablet TAKE 1 TABLET 3 TIMES A DAY. 90 tablet 0 Past Month at Unknown time  . Multiple Vitamin (MULTIVITAMIN PO) Take 1 tablet by mouth daily.     07/23/2016 at Unknown time  . naproxen (NAPROSYN) 500 MG tablet TAKE 1 TABLET EVERY TWELVE HOURS AS NEEDED. (Patient taking differently: TAKE 1 TABLET EVERY TWELVE HOURS AS NEEDED FOR PAIN) 60 tablet 6 Past Month at Unknown time  . nortriptyline (PAMELOR) 25 MG capsule Take 1 capsule (25 mg total) by mouth at bedtime. 30 capsule 6 07/23/2016 at Unknown time  . ondansetron (ZOFRAN) 8 MG tablet TAKE 1 TABLET EVERY 12 HOURS IF NEEDED FOR NAUSEA. 20 tablet 0 Past Week at Unknown time  . oxyCODONE (ROXICODONE) 15 MG immediate release tablet Take 1 tablet (15 mg total) by mouth every 6 (six) hours as needed for pain. Please fill on or after 08/11/16 120 tablet 0 07/23/2016  at Unknown time  . pantoprazole (PROTONIX) 40 MG tablet Take 1 tablet (40 mg total) by mouth daily. 30 tablet 11 07/24/2016 at 0530  . Riboflavin (VITAMIN B-2 PO) Take 400 mg by mouth 2 (two) times daily.   07/23/2016 at Unknown time  . SUMAtriptan (IMITREX) 100 MG tablet TAKE 1 TABLET AT ONSET OF MIGRAINE- MAY REPEAT ONCE IN 2 HOURS. LIMIT2/24 HOURS. AVOID DAILY USE. 9 tablet 6 Past Week  . topiramate (TOPAMAX) 200 MG tablet TAKE 1 TABLET TWICE DAILY. 60 tablet 11 07/24/2016 at 0530  . verapamil (CALAN-SR) 120 MG CR tablet TAKE ONE TABLET AT BEDTIME. 30 tablet 11 07/23/2016 at 0900  . cyclobenzaprine (FLEXERIL) 5 MG tablet TAKE 1 TABLET THREE TIMES DAILY AS NEEDED FOR MUSCLE SPASMS. 90 tablet 1 Taking    ROS  Neg  There were no vitals taken for this  visit. Physical Exam   Colpocele grade 3/3, Cystocele grade 4/4, Rectocele grade 2/3.    Assessment/Plan: Colpocele/CystoRectocele for Robotic Sacrocolpopexy with Bilateral Oophorectomy.  Possible A/P repair.  TVT/Cystoscopy Dr Pamala Hurry.  Surgery and risks thoroughly reviewed with patient.  Clearance obtained from Neuro for Trendelenburg given h/o Syringomyelia with Decompression surgery 2005.  Simon Llamas,MARIE-LYNE 07/24/2016, 6:32 AM

## 2016-07-24 NOTE — Op Note (Signed)
07/24/2016  12:34 PM  PATIENT:  Beth Huffman  55 y.o. female  PRE-OPERATIVE DIAGNOSIS:  Prolapse, Complete Colpocele, CystoRectocele, History of Urinary Incontinence  POST-OPERATIVE DIAGNOSIS:  Prolapse, Complete Colpocele, CystoRectocele, History of Urinary Incontinence  PROCEDURE:  Procedure(s): ROBOTIC ASSISTED LAPAROSCOPIC SACROCOLPOPEXY WITH BILATERAL SALPINGO-OOPHORECTOMY AND LYSIS OF ADHESIONS ANTERIOR (CYSTOCELE) REPAIR, PERINEOPLASTY TRANSVAGINAL TAPE (TVT) PROCEDURE CYSTOSCOPY  SURGEON:  Surgeon(s): Princess Bruins, MD Aloha Gell, MD  ASSISTANTS: Dr Aloha Gell  ANESTHESIA:   General  PROCEDURE:  Under general anesthesia with endotracheal intubation the patient is in lithotomy position. She is on prepped with Betadine on the suprapubic, vulvar and vaginal areas. She is prepped with ChloraPrep on the abdomen. The Foley is inserted in the bladder. The patient is draped as usual.  The patient is status post total abdominal hysterectomy. EEAs will be used to delineate the vagina later in the procedure.  Abdominally, we infiltrate the supraumbilical area with Marcaine one quarter plain. We make a 1.5 cm incision with the scalpel. The aponeurosis is grasped with cokers and and is opened under direct vision with Mayo scissors. The parietal peritoneum is opened bluntly with the finger. A pursestring stitch is done on the aponeurosis with Vicryl 0.  The Sheryle Hail is inserted under direct vision and a pneumoperitoneum is created with CO2. Inspection of the abdominopelvic cavities reveal no adhesion with the anterior wall.  A semicircular configuration is used for ports placement.  All sites are marked with a surgical pen, infiltrated with Marcaine one quarter plain, and small incisions are made with the scalpel.  2 robotic ports are inserted under direct vision on the left side.  One robotic port is inserted on the right upper side.  And the 8 mm assistant port is inserted in the  right lower side.  The robot his docked from the midline.  Instruments are inserted under direct vision with the Endo Shears scissor in the first arm, the PK in the second arm and the pro grasp in the third arm.  We go to the console.      Both ureters are seen and normal anatomy position with good peristalsis.  Both ovaries are normal in size and appearance with the distal portion of the tubes.  An omental adhesion is present at the vaginal vault.  We proceed with lysis of the omental adhesion and removal of that tissue through the assistant port.  We then cauterized and section the left infundibulopelvic ligament. We follow under the left ovary, cauterizing and sectioning.  The left tube and ovary are completely excised.  The specimen is put in the cul-de-sac.  We proceed exactly the same way on the right side.  Both specimens are grasped in the assistant clamp.  An Endobag is used with the eighth millimeter camera to remove the 2 ovaries with the 2 distal ends of the tubes.  The specimens are sent to pathology.  The large EEA is inserted in the vagina.  The visceral peritoneum is opened at the apex of the vagina.  The bladder is dissected down anteriorly to free 4 cm of vagina.  The bladder is filled with fluid to assure that it is well retracted.  Dissection is done down 3 cm of the posterior vagina.  We then opened the visceral peritoneum towards the sacrum creating a tunnel.  The visceral peritoneum is lifted from the sacrum and the adipose tissue is dissected to expose the anterior ligament of the sacrum at the level of the promontory.  The  Y mesh was trimmed at 4 cm anteriorly and 2 cm posteriorly.  Gore-Tex was used in separate stitches to secure the mesh on the anterior vagina apex and posterior vagina. 6 stitches were applied anteriorly and 4 stitches posteriorly. We then extended the tail of the Y mesh towards the promontory of the sacrum.  We went vaginally to assure a good suspension of the vaginal  vault.  The tail of the Y mesh was trimmed appropriately.  We then put 2 separate stitches of Gortex to secure the mesh to the anterior ligament at the level of the promontory.  All needles were removed as we went along through the assistant port. Hemostasis was adequate at all levels.  The visceral peritoneum was closed to cover the mesh entirely with a V lock 2-0 in a running suture.  That suture was removed as well through the assistant port.  Both ureters were seen with good peristalsis.  Pictures were taken of the abdominopelvic cavities after sacrocolpopexy and bilateral salpingo-oophorectomy.  Good hemostasis was confirmed once more.  All robotic instruments were removed. The robot was undocked. The patient was removed from Trendelenburg. There was no fluid to suction.  We poured bupivacaine in the abdominopelvic cavities.  All ports were removed under direct vision.  The CO2 was evacuated.  The supraumbilical incision was closed by attaching the pursestring stitch at the aponeurosis.  A Vicryl 4-0 in a separate stitch reapproximated the adipose tissue.  All incisions were closed at the skin with a subcuticular suture of Vicryl 4-0. Dermabond was added on all incisions.  We went to the vagina.      Dr. Pamala Hurry performed a TVT followed by a diagnostic cystoscopy.  That part of the procedure will be dictated by Dr. Pamala Hurry.      I then took over and proceeded with a cystocele repair.  The vaginal mucosa was infiltrated with Marcaine with epinephrine.  Metzenbaums scissors and stroley scissors were used to open the vaginal mucosa on the midline.  The bladder was then retracted from the vaginal mucosa.  The fascia was reinforced by separate stitches of Vicryl 2-0.  The excess vaginal mucosa was resected with straight scissors.  The vaginal mucosa was then closed with a running suture of Vicryl 2-0.  I then proceeded with a perineoplasty.  The skin was opened at the perineum in a losange with the scalpel.   That skin was resected.  A Vicryl 2-0 was used  to reinforce the Levator Ani and the transverse muscle of the perineum.  The perineum was then closed with a Vicryl 2-0 in running suture at the subcutaneous tissue and then coming back towards the top of the perineum in a subcuticular suture.  Hemostasis was adequate.  The vagina was packed with packing enrobed with Estrace cream.  The patient was brought to recovery room in good and stable status.  ED BLOOD LOSS:  50 cc   Intake/Output Summary (Last 24 hours) at 07/24/16 1234 Last data filed at 07/24/16 1230  Gross per 24 hour  Intake             1900 ml  Output             1140 ml  Net              760 ml     BLOOD ADMINISTERED:none   LOCAL MEDICATIONS USED:  MARCAINE    and BUPIVICAINE   SPECIMEN:  Source of Specimen:  Bilateral ovaries and  tubes  DISPOSITION OF SPECIMEN:  PATHOLOGY  COUNTS:  YES  PLAN OF CARE: Transfer to PACU  Princess Bruins MD  07/24/2016 at 12:37 pm

## 2016-07-24 NOTE — Anesthesia Preprocedure Evaluation (Addendum)
Anesthesia Evaluation  Patient identified by MRN, date of birth, ID band Patient awake    Reviewed: Allergy & Precautions, NPO status , Patient's Chart, lab work & pertinent test results  History of Anesthesia Complications Negative for: history of anesthetic complications  Airway Mallampati: II  TM Distance: >3 FB Neck ROM: Full    Dental no notable dental hx. (+) Dental Advisory Given, Chipped,    Pulmonary neg pulmonary ROS,    Pulmonary exam normal breath sounds clear to auscultation       Cardiovascular hypertension, Pt. on medications Normal cardiovascular exam+ dysrhythmias  Rhythm:Regular Rate:Normal     Neuro/Psych  Headaches, neg Seizures PSYCHIATRIC DISORDERS  Neuromuscular disease (hx of brain surgery, residual numbness and weakness on right side, hand)    GI/Hepatic Neg liver ROS, GERD  Medicated and Controlled,  Endo/Other  negative endocrine ROS  Renal/GU negative Renal ROS  negative genitourinary   Musculoskeletal negative musculoskeletal ROS (+)   Abdominal   Peds negative pediatric ROS (+)  Hematology negative hematology ROS (+)   Anesthesia Other Findings   Reproductive/Obstetrics negative OB ROS                             Anesthesia Physical Anesthesia Plan  ASA: II  Anesthesia Plan: General   Post-op Pain Management:    Induction: Intravenous  Airway Management Planned: Oral ETT  Additional Equipment:   Intra-op Plan:   Post-operative Plan: Extubation in OR  Informed Consent: I have reviewed the patients History and Physical, chart, labs and discussed the procedure including the risks, benefits and alternatives for the proposed anesthesia with the patient or authorized representative who has indicated his/her understanding and acceptance.   Dental advisory given  Plan Discussed with: CRNA  Anesthesia Plan Comments: (Discussed with patient plan for  pain management given allergies. She reports that she had a fentanyl patch that caused her nausea after her brain surgery. She reports that she has had no problems with dilaudid. We discussed using fentanyl IV and dilaudid for her surgery. She is ok with this plan. She understands that PONV is common after anesthesia for a variety of reasons. )       Anesthesia Quick Evaluation

## 2016-07-24 NOTE — H&P (Signed)
CC: pelvic organ prolapse  HPI: 55 yo with POP s/p hysterectomy who is coming for sacrocolpopexy. Pt w/o current stress incontinence due to cystocele but did have stress incont in the past.    All: reviewed, mostly N/V reaction Past Medical History:  Diagnosis Date  . Abnormal weight gain   . GERD (gastroesophageal reflux disease)   . Hypertension   . LBP (low back pain)   . Migraines   . Pyelonephritis 2008  . Stress 2009  . Syringobulbia ([place])   . Syringomyelia ([place])   . Tachycardia   . UTI (lower urinary tract infection)     Past Surgical History:  Procedure Laterality Date  . ABDOMINAL HYSTERECTOMY    . COLONOSCOPY    . intracranial decompression surgery  2006  . TUBAL LIGATION     PE: Vitals:   07/24/16 0626  BP: 123/80  Pulse: 84  Resp: 20  Temp: 97.8 F (36.6 C)  TempSrc: Oral  SpO2: 100%   Gen: well appearing, no distress LE: NT, no edema Abd: soft GU: def to OR  CBC    Component Value Date/Time   WBC 5.4 07/14/2016 1400   RBC 3.84 (L) 07/14/2016 1400   HGB 11.7 (L) 07/14/2016 1400   HCT 35.0 (L) 07/14/2016 1400   PLT 192 07/14/2016 1400   MCV 91.1 07/14/2016 1400   MCH 30.5 07/14/2016 1400   MCHC 33.4 07/14/2016 1400   RDW 13.2 07/14/2016 1400   LYMPHSABS 1.9 12/21/2013 0900   MONOABS 0.2 12/21/2013 0900   EOSABS 0.1 12/21/2013 0900   BASOSABS 0.0 12/21/2013 0900    A/P: POP for sacrocolpopexy and prophylactic TVT sling.  - pt aware TVT is for prevention as many women with corrected prolpase will have stress incont. Risks of sling including de novo urge incont, inability to void, sling erosion, surgical injury.  Nalin Mazzocco A. 07/24/2016 7:20 AM

## 2016-07-24 NOTE — Brief Op Note (Signed)
07/24/2016  1:21 PM  PATIENT:  Beth Huffman  55 y.o. female  PRE-OPERATIVE DIAGNOSIS:  Stress Urinary Incontinence  POST-OPERATIVE DIAGNOSIS:  Stress Urinary Incontinence  PROCEDURE: TVT sling, cystoscopy SURGEON:  Surgeon(s) and Role:    * Aloha Gell, MD - Primary   PHYSICIAN ASSISTANT: none  ASSISTANTS: none   ANESTHESIA:   general  EBL:  Total I/O In: 2000 [I.V.:2000] Out: 1140 [Urine:1100; Blood:40]  BLOOD ADMINISTERED:none  DRAINS: Urinary Catheter (Foley)   LOCAL MEDICATIONS USED:  MARCAINE     SPECIMEN:  No Specimen  DISPOSITION OF SPECIMEN:  N/A  COUNTS:  YES  TOURNIQUET:  * No tourniquets in log *  DICTATION: .Note written in EPIC  PLAN OF CARE: Admit for overnight observation  PATIENT DISPOSITION:  PACU - hemodynamically stable.   Delay start of Pharmacological VTE agent (>24hrs) due to surgical blood loss or risk of bleeding: yes

## 2016-07-24 NOTE — Anesthesia Procedure Notes (Signed)
Performed by: Elenore Paddy

## 2016-07-24 NOTE — Op Note (Signed)
OP note for TVT portion of surgery.  07/24/2016  1:21 PM  PATIENT:  Beth Huffman  55 y.o. female  PRE-OPERATIVE DIAGNOSIS:  Stress Urinary Incontinence  POST-OPERATIVE DIAGNOSIS:  Stress Urinary Incontinence  PROCEDURE: TVT sling, cystoscopy SURGEON:  Surgeon(s) and Role:    * Aloha Gell, MD - Primary   PHYSICIAN ASSISTANT: none  ASSISTANTS: none   ANESTHESIA:   general  EBL:  Total I/O In: 2000 [I.V.:2000] Out: 1140 [Urine:1100; Blood:40]  BLOOD ADMINISTERED:none  DRAINS: Urinary Catheter (Foley)   LOCAL MEDICATIONS USED:  MARCAINE     SPECIMEN:  No Specimen  DISPOSITION OF SPECIMEN:  N/A  COUNTS:  YES  TOURNIQUET:  * No tourniquets in log *  DICTATION: .Note written in EPIC  PLAN OF CARE: Admit for overnight observation  PATIENT DISPOSITION:  PACU - hemodynamically stable.   Delay start of Pharmacological VTE agent (>24hrs) due to surgical blood loss or risk of bleeding: yes   Procedure tension-free vaginal tape sling placement, retropubic placement, cystoscopy Surgeon: Pamala Hurry Assistant none Anesthesia Gen. endotracheal anesthesia Antibiotics 2 g Ancef Complications: None Estimated blood loss: 10 cc Findings: cystocele, Hypermobile urethra,  normal vaginal mucosa, normal bladder and urethra, no sling material seen in bladder or urethra post procedure  Indications: Pt with history of stress incontinence which improved as cystocele worsened. Given concurrent prolapse surgery, pt opted for TVT sling.  Patient has been counseled on the risk and benefits of this procedure including the risk of urinary retention (and possibly needing to go home with a catheter), risk of sling erosion, immediate surgical and anaesthesia risks,  and possibly worsening or causing de-novo urge incontinence. As stress incontinence is her main concern,  patient consented to TVT sling.  Procedure: After informed consent was obtained from the patient she was taken to the  operating room where general anesthesia was initiated without difficulty.  Patient had robotic assisted sacrocolpopexy by Dr. Dellis Filbert. I then took over as Pharmacologist. The mons pubis was shaved, the pubic symphysis was marked for the planned bilateral exit points, 2 cm lateral to the midline just above the a pubic symphysis.  A solution of 30 cc of 1% lidocaine with 1:100,000 units of epinephrine mixed with 60 cc of normal saline was used as the injection. 30 cc of this solution was injected into the suprapubic space bilaterally, an additional 10 cc of this solution was injected along the planned exit routes bilaterally. This placement was accomplished by placing a 20-gauge spinal needle from the planned abdominal exit points behind the pubic symphysis and into the retropubic space. This space was confirmed with the use of the hand in the vagina to feel the needle tip prior to aspirating and injecting. A Foley catheter was then placed to drain the bladder the balloon was inflated and used to help identify the bladder and to plan the sling placement in the mid urethral position. An Allis clamp was placed 2 cm below the urethral meatus; a second Allis clamp was placed 2 cm below that. 10 cc of the injecting solution were placed between the 2 Allis clamps with some lateral spread. A #15 blade was used to incise between the Allis clamps and [place] scissors were used to dissect the vaginal mucosa laterally. Allis clamps were placed on the vaginal edges to help accomplish this dissection. The dissection was taken to just below the level of the pubic symphysis. Once the planned entry routes were dissected a rigid Foley was placed into the bladder.  The rigid Foley was deviated towards the patient's right knee. With the anesthesiologist pointing out the patient's right shoulder so that I could aim to the midclavicular line, the trocar with trocar sleeve and sling attached was inserted into the pre-dissected space and was  pushed under the pubic symphysis. With dropping the trocar handle towards the ground and sharply rotating the trocar to the abdominal wall I was able to guide the trocar out the abdominal wall at the planned exit point. A Lameshia Hypolite clamp was placed on the trocar sleeve the trocar was removed through the vagina. The sling tape was assessed and straightened and the trocar was placed in the opposite trocar sleeve. Again with retracting the dissected vaginal mucosa and placing the trocar in the planned left sided entry route, along with deviating the rigid Foley to the patient's left knee and aiming towards the left midclavicular line the trocar was passed under the pubic symphysis sharply rotated out of the abdominal wall and out the planned exit route. At this point cystoscopy was carried out. The bladder was filled to 300 cc and evaluation was done of the entire bladder mucosa and the urethra to ensure no sling material was eroding through the mucosa.  Using an Allis clamp to protect a small knuckle of sling material in the midpoint of the sling just under the urethra, the sling was pulled out through the abdominal entry points. Proper tensioning was assured with the use of a Allis clamp and once I was assured a tension-free placement Thelonious Kauffmann clamps were placed on the plastic sheaths at the abdominal exit points and the abdominal sheaths were removed. At this point a Kary Kos was released and a tension-free placement was seen. Good hemostasis was noted, a repeat cystoscopy was done to confirm no sling material and the bladder or urethra. The vaginal mucosa was closed with 2-0 Vicryl on a CT-1. The patient tolerated the procedure well. Sponge, lap and needle counts were correct x3 and the patient was taken to the recovery room in stable condition.  Baneen Wieseler A. 07/24/2016 1:26 PM

## 2016-07-24 NOTE — Anesthesia Procedure Notes (Signed)
Procedure Name: Intubation Date/Time: 07/24/2016 7:37 AM Performed by: Elenore Paddy Pre-anesthesia Checklist: Emergency Drugs available, Patient identified, Suction available, Patient being monitored and Timeout performed Patient Re-evaluated:Patient Re-evaluated prior to inductionOxygen Delivery Method: Circle system utilized Preoxygenation: Pre-oxygenation with 100% oxygen Intubation Type: IV induction Ventilation: Mask ventilation without difficulty Laryngoscope Size: Mac and 3 Grade View: Grade I Tube type: Oral Tube size: 7.0 mm Number of attempts: 1 (Intubation performed by Donia Pounds) Airway Equipment and Method: Stylet Placement Confirmation: ETT inserted through vocal cords under direct vision,  positive ETCO2 and breath sounds checked- equal and bilateral Secured at: 21 cm Tube secured with: Tape Dental Injury: Teeth and Oropharynx as per pre-operative assessment

## 2016-07-24 NOTE — Anesthesia Postprocedure Evaluation (Signed)
Anesthesia Post Note  Patient: Beth Huffman  Procedure(s) Performed: Procedure(s) (LRB): ROBOTIC ASSISTED LAPAROSCOPIC SACROCOLPOPEXY WITH PERINEOPLASTY (N/A) Possible ANTERIOR (CYSTOCELE) repair, perineoplasty (N/A) TRANSVAGINAL TAPE (TVT) PROCEDURE (N/A) CYSTOSCOPY (N/A)  Patient location during evaluation: PACU Anesthesia Type: General Level of consciousness: awake and alert and oriented Pain management: pain level controlled Vital Signs Assessment: post-procedure vital signs reviewed and stable Respiratory status: spontaneous breathing, nonlabored ventilation, respiratory function stable and patient connected to nasal cannula oxygen Cardiovascular status: blood pressure returned to baseline and stable Postop Assessment: no signs of nausea or vomiting Anesthetic complications: no     Last Vitals:  Vitals:   07/24/16 1300 07/24/16 1315  BP: 122/71   Pulse: 80   Resp: 16 16  Temp:      Last Pain:  Vitals:   07/24/16 1345  TempSrc:   PainSc: (P) 6    Pain Goal: Patients Stated Pain Goal: 3 (07/24/16 0626)               Basma Buchner A.

## 2016-07-25 ENCOUNTER — Encounter (HOSPITAL_COMMUNITY): Payer: Self-pay

## 2016-07-25 DIAGNOSIS — K219 Gastro-esophageal reflux disease without esophagitis: Secondary | ICD-10-CM | POA: Diagnosis not present

## 2016-07-25 DIAGNOSIS — N838 Other noninflammatory disorders of ovary, fallopian tube and broad ligament: Secondary | ICD-10-CM | POA: Diagnosis not present

## 2016-07-25 DIAGNOSIS — G950 Syringomyelia and syringobulbia: Secondary | ICD-10-CM | POA: Diagnosis not present

## 2016-07-25 DIAGNOSIS — I10 Essential (primary) hypertension: Secondary | ICD-10-CM | POA: Diagnosis not present

## 2016-07-25 DIAGNOSIS — N993 Prolapse of vaginal vault after hysterectomy: Secondary | ICD-10-CM | POA: Diagnosis not present

## 2016-07-25 LAB — CBC
HCT: 32 % — ABNORMAL LOW (ref 36.0–46.0)
Hemoglobin: 10.7 g/dL — ABNORMAL LOW (ref 12.0–15.0)
MCH: 31 pg (ref 26.0–34.0)
MCHC: 33.4 g/dL (ref 30.0–36.0)
MCV: 92.8 fL (ref 78.0–100.0)
Platelets: 186 10*3/uL (ref 150–400)
RBC: 3.45 MIL/uL — ABNORMAL LOW (ref 3.87–5.11)
RDW: 13.6 % (ref 11.5–15.5)
WBC: 9.2 10*3/uL (ref 4.0–10.5)

## 2016-07-25 MED ORDER — OXYCODONE-ACETAMINOPHEN 7.5-325 MG PO TABS: 1.0000 | ORAL_TABLET | ORAL | 0 refills | Status: DC | PRN

## 2016-07-25 MED ORDER — INFLUENZA VAC SPLIT QUAD 0.5 ML IM SUSY
0.5000 mL | PREFILLED_SYRINGE | Freq: Once | INTRAMUSCULAR | Status: AC
  Administered 2016-07-25: 0.5 mL via INTRAMUSCULAR
  Filled 2016-07-25: qty 0.5

## 2016-07-25 MED ORDER — PNEUMOCOCCAL VAC POLYVALENT 25 MCG/0.5ML IJ INJ
0.5000 mL | INJECTION | INTRAMUSCULAR | Status: AC
  Administered 2016-07-25: 0.5 mL via INTRAMUSCULAR
  Filled 2016-07-25: qty 0.5

## 2016-07-25 MED ORDER — PNEUMOCOCCAL VAC POLYVALENT 25 MCG/0.5ML IJ INJ
0.5000 mL | INJECTION | INTRAMUSCULAR | Status: DC
  Filled 2016-07-25: qty 0.5

## 2016-07-25 MED ORDER — INFLUENZA VAC SPLIT QUAD 0.5 ML IM SUSY: 0.5000 mL | PREFILLED_SYRINGE | INTRAMUSCULAR | Status: DC

## 2016-07-25 NOTE — Discharge Summary (Signed)
Physician Discharge Summary  Patient ID: Beth Huffman MRN: [id number] DOB/AGE: Aug 31, 1961 55 y.o.  Admit date: 07/24/2016 Discharge date: 07/25/2016  Admission Diagnoses: Prolapse, Complete Colpocele, CystoRectocele, Urinary Incontinence  Discharge Diagnoses: Prolapse, Complete Colpocele, CystoRectocele, Urinary Incontinence        Active Problems:   Postoperative state   Discharged Condition: good  Hospital Course: good  Consults: None  Treatments: surgery: ROBOTIC ASSISTED LAPAROSCOPIC SACROCOLPOPEXY WITH BILATERAL SALPINGO-OOPHORECTOMY AND LYSIS OF ADHESIONS. PERINEOPLASTY (N/A), ANTERIOR (CYSTOCELE) REPAIR TRANSVAGINAL TAPE (TVT) PROCEDURE (N/A) CYSTOSCOPY (N/A)   Disposition: 01-Home or Self Care     Medication List    STOP taking these medications   hydrochlorothiazide 12.5 MG capsule Commonly known as:  MICROZIDE   naproxen 500 MG tablet Commonly known as:  NAPROSYN   ondansetron 8 MG tablet Commonly known as:  ZOFRAN   oxyCODONE 15 MG immediate release tablet Commonly known as:  ROXICODONE     TAKE these medications   cholecalciferol 1000 units tablet Commonly known as:  VITAMIN D Take 1,000 Units by mouth daily.   cyclobenzaprine 5 MG tablet Commonly known as:  FLEXERIL TAKE 1 TABLET THREE TIMES DAILY AS NEEDED FOR MUSCLE SPASMS.   estradiol 0.075 mg/24hr patch Commonly known as:  CLIMARA - Dosed in mg/24 hr Place 0.075 mg onto the skin once a week. PATCH CHANGE ON TUESDAYS   ibuprofen 600 MG tablet Commonly known as:  ADVIL,MOTRIN TAKE 1 TABLET 3 TIMES A DAY.   MULTIVITAMIN PO Take 1 tablet by mouth daily.   nortriptyline 25 MG capsule Commonly known as:  PAMELOR Take 1 capsule (25 mg total) by mouth at bedtime.   oxyCODONE-acetaminophen 7.5-325 MG tablet Commonly known as:  PERCOCET Take 1 tablet by mouth every 4 (four) hours as needed for severe pain.   pantoprazole 40 MG tablet Commonly known as:  PROTONIX Take 1 tablet (40 mg  total) by mouth daily.   SUMAtriptan 100 MG tablet Commonly known as:  IMITREX TAKE 1 TABLET AT ONSET OF MIGRAINE- MAY REPEAT ONCE IN 2 HOURS. LIMIT2/24 HOURS. AVOID DAILY USE.   topiramate 200 MG tablet Commonly known as:  TOPAMAX TAKE 1 TABLET TWICE DAILY.   verapamil 120 MG CR tablet Commonly known as:  CALAN-SR TAKE ONE TABLET AT BEDTIME.   VITAMIN B-2 PO Take 400 mg by mouth 2 (two) times daily.      Follow-up Information    Mykale Gandolfo,MARIE-LYNE, MD Follow up in 3 week(s).   Specialty:  Obstetrics and Gynecology Contact information: [address] [place] [id number] [phone]           Signed: Princess Bruins, MD 07/25/2016, 1:29 PM

## 2016-07-25 NOTE — Progress Notes (Signed)
POD # 1  Pain overall controlled but needing oral narcotics with breakthrough IV dosing. Tol po. Void x 2 since catheter out.   PE:  Vitals:   07/25/16 0225 07/25/16 0632 07/25/16 1025 07/25/16 1230  BP: (!) 104/56 116/64 117/66   Pulse: 88 73 62   Resp: 16 18 18 18   Temp: 99.2 F (37.3 C) 98.3 F (36.8 C) 97.8 F (36.6 C)   TempSrc:   Oral   SpO2: 97% 98% 100%   Weight:      Height:       Abd: soft, approp tender, NT Gen: well, no distress Skin: warm, dry Inc: C/D/I  CBC    Component Value Date/Time   WBC 9.2 07/25/2016 0516   RBC 3.45 (L) 07/25/2016 0516   HGB 10.7 (L) 07/25/2016 0516   HCT 32.0 (L) 07/25/2016 0516   PLT 186 07/25/2016 0516   MCV 92.8 07/25/2016 0516   MCH 31.0 07/25/2016 0516   MCHC 33.4 07/25/2016 0516   RDW 13.6 07/25/2016 0516   LYMPHSABS 1.9 12/21/2013 0900   MONOABS 0.2 12/21/2013 0900   EOSABS 0.1 12/21/2013 0900   BASOSABS 0.0 12/21/2013 0900    A/P: post-op TVT sling - doing well. Adequate void. D/w pt she should be voiding q 6 hrs, with any voiding difficulty contact on call MD over the weekend.   - post-op sacrocolpopexy. Following with  Dr. Dellis Filbert.   Charbel Los A. 07/25/2016 1:21 PM

## 2016-07-25 NOTE — Anesthesia Postprocedure Evaluation (Signed)
Anesthesia Post Note  Patient: Beth Huffman  Procedure(s) Performed: Procedure(s) (LRB): ROBOTIC ASSISTED LAPAROSCOPIC SACROCOLPOPEXY WITH PERINEOPLASTY (N/A) Possible ANTERIOR (CYSTOCELE) repair, perineoplasty (N/A) TRANSVAGINAL TAPE (TVT) PROCEDURE (N/A) CYSTOSCOPY (N/A)  Patient location during evaluation: [facility] Anesthesia Type: General Level of consciousness: awake Pain management: pain level controlled Vital Signs Assessment: post-procedure vital signs reviewed and stable Respiratory status: spontaneous breathing Cardiovascular status: stable Postop Assessment: no signs of nausea or vomiting Anesthetic complications: no     Last Vitals:  Vitals:   07/25/16 0225 07/25/16 0632  BP: (!) 104/56 116/64  Pulse: 88 73  Resp: 16 18  Temp: 37.3 C 36.8 C    Last Pain:  Vitals:   07/25/16 0527  TempSrc:   PainSc: 7    Pain Goal: Patients Stated Pain Goal: 3 (07/25/16 0527)               Everette Rank

## 2016-07-25 NOTE — Addendum Note (Signed)
Addendum  created 07/25/16 [id number] by Georgeanne Nim, CRNA   Sign clinical note

## 2016-07-25 NOTE — Progress Notes (Signed)
1 Day Post-Op  1 Day Post-Op Procedure(s) (LRB): ROBOTIC ASSISTED LAPAROSCOPIC SACROCOLPOPEXY WITH BILATERAL SALPINGO-OOPHORECTOMY, LYSIS OF ADHESIONS ANTERIOR (CYSTOCELE) REPAIR, PERINEOPLASTY (N/A) TRANSVAGINAL TAPE (TVT) PROCEDURE (N/A) CYSTOSCOPY (N/A)   Subjective: Patient reports that pain is well managed.  Tolerating normal diet as tolerated  diet without difficulty. No nausea / vomiting.  Ambulating and voiding.  Objective: BP 117/66 (BP Location: Right Arm)   Pulse 62   Temp 97.8 F (36.6 C) (Oral)   Resp 18   Ht 5\' 6"  (1.676 m)   Wt 170 lb (77.1 kg)   SpO2 100%   BMI 27.44 kg/m  Lungs: clear Heart: normal rate and rhythm Abdomen:soft and appropriately tender Extremities: Homans sign is negative, no sign of DVT Incision: healing well  Assessment: s/p Procedure(s): ROBOTIC ASSISTED LAPAROSCOPIC SACROCOLPOPEXY WITH PERINEOPLASTY Possible ANTERIOR (CYSTOCELE) repair, perineoplasty TRANSVAGINAL TAPE (TVT) PROCEDURE CYSTOSCOPY: progressing well  Plan: Discharge home  LOS: 1 day    Kohl Polinsky,MARIE-LYNE 07/25/2016, 1:21 PM

## 2016-07-25 NOTE — Discharge Instructions (Signed)
Sacrocolpopexy, Care After Refer to this sheet in the next few weeks. These instructions provide you with information on caring for yourself after your procedure. Your health care provider may also give you more specific instructions. Your treatment has been planned according to current medical practices, but problems sometimes occur. Call your health care provider if you have any problems or questions after your procedure.  WHAT TO EXPECT AFTER THE PROCEDURE  After your procedure, it is typical to have the following:  Pain.  Some vaginal bleeding.  Fatigue. HOME CARE INSTRUCTIONS  Medicine  Only take medicines as directed by your health care provider.  Make sure to finish antibiotic medicine even if you start to feel better. Bandages  Care for your bandages and cuts (incisions) as directed by your surgeon.  Do not shower until after your bandages have been removed. Activity  Take at least two 10-minute walks each day.  Ask your health care provider when you can return to work and do all your usual activities. It may take 3 months to recover completely.  You will have to restrict many activities when you first return home. For at least 6 weeks:  Do not lift anything heavier than a gallon of milk.  Do not sit on a bike seat.  Do not use a tampon or put anything inside your vagina.  Do not have sexual intercourse.  Do not participate instrenuous activities like running or aerobics. Other Instructions  Do not take a bath or go swimming until your health care provider says you can. Most people can shower after about 6 weeks after the procedure.  Drink enough fluids to keep your urine clear or pale yellow. This helps prevent constipation.  See your health care provider to have your stitches or staples removed if necessary.  Keep all follow-up appointments. SEEK MEDICAL CARE IF:   You have chills or fever.  Your pain medicine is not helping.  You have vaginal bleeding  or discharge that will not go away. SEEK IMMEDIATE MEDICAL CARE IF:   You have a fever for more than 2-3 days.  You have very bad pain.  You have heavy vaginal bleeding or discharge.  You have any signs of infection around your cut (incision). Watch for:  Redness.  Tenderness.  Warmth.  Pus or other discharge.  You develop a warm, tender area in your leg.  You have chest pain or trouble breathing.   This information is not intended to replace advice given to you by your health care provider. Make sure you discuss any questions you have with your health care provider.   Document Released: 10/18/2013 Document Reviewed: 10/18/2013 Elsevier Interactive Patient Education [employer].

## 2016-08-03 ENCOUNTER — Other Ambulatory Visit: Payer: Self-pay | Admitting: Internal Medicine

## 2016-08-20 DIAGNOSIS — N898 Other specified noninflammatory disorders of vagina: Secondary | ICD-10-CM | POA: Diagnosis not present

## 2016-08-20 DIAGNOSIS — R300 Dysuria: Secondary | ICD-10-CM | POA: Diagnosis not present

## 2016-08-21 ENCOUNTER — Encounter: Payer: Self-pay | Admitting: Nurse Practitioner

## 2016-08-21 ENCOUNTER — Ambulatory Visit (INDEPENDENT_AMBULATORY_CARE_PROVIDER_SITE_OTHER): Payer: BLUE CROSS/BLUE SHIELD | Admitting: Nurse Practitioner

## 2016-08-21 VITALS — BP 117/76 | HR 88 | Ht 66.0 in | Wt 166.0 lb

## 2016-08-21 DIAGNOSIS — G441 Vascular headache, not elsewhere classified: Secondary | ICD-10-CM

## 2016-08-21 DIAGNOSIS — G43719 Chronic migraine without aura, intractable, without status migrainosus: Secondary | ICD-10-CM

## 2016-08-21 DIAGNOSIS — G43909 Migraine, unspecified, not intractable, without status migrainosus: Secondary | ICD-10-CM

## 2016-08-21 MED ORDER — SUMATRIPTAN SUCCINATE 100 MG PO TABS: ORAL_TABLET | ORAL | 6 refills | Status: DC

## 2016-08-21 MED ORDER — NORTRIPTYLINE HCL 25 MG PO CAPS: 25.0000 mg | ORAL_CAPSULE | Freq: Every day | ORAL | 6 refills | Status: DC

## 2016-08-21 NOTE — Progress Notes (Signed)
[facility]  PATIENT: Beth Huffman DOB: 25-Mar-1961   REASON FOR VISIT: follow up for migraine,  HISTORY FROM: Patient    HISTORY OF PRESENT ILLNESS: HISTORY: Beth Huffman is a 55 years old right-handed female, referred by her primary care physician Dr. Lauraine Rinne evaluation of chronic headaches I saw her previously in May 2012 for similar complaints, she presented with chronic headaches since early 1990s, extensive imaging study in the past showed evidence of giant cistern magna, she underwent occipital posterior fossa decompression surgery in [place] in 2005 with some improvement of her headaches.  I have reviewed MRI report in 1994, from [facility] prior to her posterior fossa decompression surgery, showed a large CSF signal density in the posterior fossa, posterior to the vermis, in the midline. 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 Additional syrinx is present from T3-T12 levels enlarging to maximum diameter at the T4 level measuring 65mm and at the T10 level measuring 31mm. Incidental note made of bilateral perineural cysts at T10 level.  CT cystogram in 2007, following decompression surgery: Again demonstrated is a cystic structure in the posterior fossa. There is a mega cisterna magna present. In addition the is a more focal collection within the posterior fossa. This area completely fills with contrast with no differential filling demonstrated to suggest residual cyst. A small amount of interventricular contrast is demonstrated. There is no hydrocephalus. There is no other intracranial mass or mass-effect. Postoperative changes of a suboccipital craniotomy are present.  EMG nerve conduction study in 2007, evidence of mild bilateral carpal tunnel  syndromes.  Since May 2016, she had more severe, frequent headaches, bilateral area severe pounding headaches, lasting for few days, failed to improved by Tylenol, ibuprofen, Zofran, BC powder, repeat dose of Imitrex, eventually presented to urgent care, her headache was improved after receiving Demerol, Phenergan shots, sleeping, She continues to have headaches 2-3 times each week, lasting few hours to 2 days, bilateral retro-orbital area severe pounding headache with associated light noise sensitivity, nauseous,  Over the years, she has tried different preventive medications, currently still taking Topamax 200 mg twice a day, verapamil 120 mg daily, has not tried Inderal, nortriptyline, questionable benefit from limited trial of Botox in the past, Trigger for her headaches are weather change, oversleep, stress, For abortive treatment, she has tried Maxalt, Relpax, Zomig Currently she is taking Imitrex 100 mg 10 tablets each months,  UPDATE July 27th 2016 Her headache has much improved, she has average 4-5 severe headaches each months, Imitrex half to one tablets as needed, plus naproxen 500 mg as needed has been helpful, her insurance does not cover [place]  She is overall happy about current progress, will stay on Topamax 200 mg twice a day, verapamil 120 mg a day, and nortriptyline, she complains mild dizziness, left ear reining recently, no hearing loss UPDATE 04/26/2017CM:  [name], 55 year old female returns for follow-up. She was last seen in the office 08/22/15.  She has had a total of 5 headaches in the last month but not all of them were severe. Most of them occur after she has had a long day at work and she has been doing lifting. 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  She is still overall  pleased with her current progress and does not wish to have any additional medications added. Imitrex works acutely. Her triggers are stress and weather changes. After her  business is closed she wants to think about getting off of some of her migraine preventive medications. She returns for reevaluation UPDATE 10/26/2017CM [name], 55 year old female returns for follow-up. She has a history of migraine headaches and tension headaches. She has had a total of 10 headaches in the last month. 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 She has been just about and renovated a  Home, still have not moved in. She had recent bladder surgery. Her migraine triggers are stress and weather changes. She is currently on Topamax 200 mg twice daily by her primary care. In addition she is on nortriptyline 25 at bedtime and Imitrex acutely. She returns for reevaluation   REVIEW OF SYSTEMS: Full 14 system review of systems performed and notable only for those listed, all others are neg:  Constitutional: neg  Cardiovascular: neg Ear/Nose/Throat: neg  Skin: neg Eyes: neg Respiratory: neg Gastroitestinal: Urinary Urgency Hematology/Lymphatic: neg  Endocrine: neg Musculoskeletal:neg Allergy/Immunology: neg Neurological: Headache Psychiatric: neg Sleep : neg   ALLERGIES: Allergies  Allergen Reactions  . Atenolol     Wt gain  . Cefuroxime Axetil     REACTION: nausea - but tolerates PCN  . Codeine Sulfate Nausea And Vomiting and Nausea Only  . Fentanyl Nausea And Vomiting    REACTION: bad reaction  . Nitrofurantoin     REACTION: HA  . Pregabalin     REACTION: cp  . Rapaflo [Silodosin]     Orthostatic BP drop, near syncope   . Tramadol Hcl Nausea And Vomiting    REACTION: sick    HOME MEDICATIONS: Outpatient Medications Prior to Visit  Medication Sig Dispense Refill  . cholecalciferol (VITAMIN D) 1000 UNITS tablet Take 1,000 Units by mouth daily.      . cyclobenzaprine (FLEXERIL) 5 MG tablet TAKE 1 TABLET THREE TIMES DAILY AS NEEDED FOR MUSCLE SPASMS. 90 tablet 1  . estradiol (CLIMARA - DOSED IN MG/24 HR) 0.075 mg/24hr patch Place  0.075 mg onto the skin once a week. PATCH CHANGE ON TUESDAYS    . ibuprofen (ADVIL,MOTRIN) 600 MG tablet TAKE 1 TABLET 3 TIMES A DAY. 90 tablet 0  . Multiple Vitamin (MULTIVITAMIN PO) Take 1 tablet by mouth daily.      . nortriptyline (PAMELOR) 25 MG capsule Take 1 capsule (25 mg total) by mouth at bedtime. 30 capsule 6  . oxyCODONE-acetaminophen (PERCOCET) 7.5-325 MG tablet Take 1 tablet by mouth every 4 (four) hours as needed for severe pain. 30 tablet 0  . Riboflavin (VITAMIN B-2 PO) Take 400 mg by mouth 2 (two) times daily.    . SUMAtriptan (IMITREX) 100 MG tablet TAKE 1 TABLET AT ONSET OF MIGRAINE- MAY REPEAT ONCE IN 2 HOURS. LIMIT2/24 HOURS. AVOID DAILY USE. 9 tablet 6  . topiramate (TOPAMAX) 200 MG tablet TAKE 1 TABLET TWICE DAILY. 60 tablet 11  . verapamil (CALAN-SR) 120 MG CR tablet TAKE ONE TABLET AT BEDTIME. 30 tablet 11  . pantoprazole (PROTONIX) 40 MG tablet Take 1 tablet (40 mg total) by mouth daily. (Patient not taking: Reported on 08/21/2016) 30 tablet 11   No facility-administered medications prior to visit.     PAST MEDICAL HISTORY: Past Medical History:  Diagnosis Date  . Abnormal weight gain   . GERD (gastroesophageal reflux disease)   . Hypertension   .  LBP (low back pain)   . Migraines   . Pyelonephritis 2008  . Stress 2009  . Syringobulbia ([place])   . Syringomyelia ([place])   . Tachycardia   . UTI (lower urinary tract infection)     PAST SURGICAL HISTORY: Past Surgical History:  Procedure Laterality Date  . ABDOMINAL HYSTERECTOMY    . ANTERIOR AND POSTERIOR REPAIR N/A 07/24/2016   Procedure: Possible ANTERIOR (CYSTOCELE) repair, perineoplasty;  Surgeon: Princess Bruins, MD;  Location: [facility];  Service: Gynecology;  Laterality: N/A;  . BLADDER SUSPENSION N/A 07/24/2016   Procedure: TRANSVAGINAL TAPE (TVT) PROCEDURE;  Surgeon: Princess Bruins, MD;  Location: [facility];  Service: Gynecology;  Laterality: N/A;  . COLONOSCOPY    . CYSTOSCOPY N/A 07/24/2016    Procedure: CYSTOSCOPY;  Surgeon: Princess Bruins, MD;  Location: [facility];  Service: Gynecology;  Laterality: N/A;  . intracranial decompression surgery  2006  . ROBOTIC ASSISTED LAPAROSCOPIC SACROCOLPOPEXY N/A 07/24/2016   Procedure: ROBOTIC ASSISTED LAPAROSCOPIC SACROCOLPOPEXY WITH PERINEOPLASTY;  Surgeon: Princess Bruins, MD;  Location: [facility];  Service: Gynecology;  Laterality: N/A;  . TUBAL LIGATION      FAMILY HISTORY: Family History  Problem Relation Age of Onset  . COPD Mother   . Peripheral vascular disease Mother   . Hypertension Mother   . Heart disease Mother   . Hypertension Father   . Anxiety disorder Other   . Heart attack Maternal Grandmother   . Stroke Maternal Grandfather   . Stroke Paternal Grandfather     SOCIAL HISTORY: Social History   Social History  . Marital status: Married    Spouse name: N/A  . Number of children: 2  . Years of education: College   Occupational History  . small business owner Self-Employed   Social History Main Topics  . Smoking status: Never Smoker  . Smokeless tobacco: Never Used  . Alcohol use No  . Drug use: No  . Sexual activity: Yes   Other Topics Concern  . Not on file   Social History Narrative   Lives at home with her husband.   Right-handed.   1 cup caffeine per day.     PHYSICAL EXAM  Vitals:   08/21/16 0945  BP: 117/76  Pulse: 88  Weight: 166 lb (75.3 kg)  Height: 5\' 6"  (1.676 m)   Body mass index is 26.79 kg/m. Generalized: Well developed, in no acute distress  Head: normocephalic and atraumatic,. Oropharynx benign  Neck: Supple, no carotid bruits  Cardiac: Regular rate rhythm, no murmur  Musculoskeletal: No deformity   Neurological examination   Mentation: Alert oriented to time, place, history taking. Attention span and concentration appropriate. Recent and remote memory intact. Follows all commands speech and language fluent.   Cranial nerve II-XII: Pupils were equal round  reactive to light extraocular movements were full, visual field were full on confrontational test. Facial sensation and strength were normal. hearing was intact to finger rubbing bilaterally. Uvula tongue midline. head turning and shoulder shrug were normal and symmetric.Tongue protrusion into cheek strength was normal. Motor: normal bulk and tone, full strength in the BUE, BLE, fine finger movements normal, no pronator drift. No focal weakness Sensory: normal and symmetric to light touch, pinprick, and Vibration, in the  upper and lower extremities Coordination: finger-nose-finger, heel-to-shin bilaterally, no dysmetria Reflexes: Brachioradialis 2/2, biceps 2/2, triceps 2/2, patellar 2/2, Achilles 2/2, plantar responses were flexor bilaterally. Gait and Station: Rising up from seated position without assistance, normal stance, moderate stride, good arm swing,  smooth turning, able to perform tiptoe, and heel walking without difficulty. Tandem gait is steady  DIAGNOSTIC DATA (LABS, IMAGING, TESTING) - I reviewed patient records, labs, notes, testing and imaging myself where available.  Lab Results  Component Value Date   WBC 9.2 07/25/2016   HGB 10.7 (L) 07/25/2016   HCT 32.0 (L) 07/25/2016   MCV 92.8 07/25/2016   PLT 186 07/25/2016      Component Value Date/Time   NA 138 07/14/2016 1400   K 3.6 07/14/2016 1400   CL 110 07/14/2016 1400   CO2 24 07/14/2016 1400   GLUCOSE 79 07/14/2016 1400   BUN 12 07/14/2016 1400   CREATININE 0.88 07/14/2016 1400   CREATININE 0.79 11/13/2015 1520   CALCIUM 8.7 (L) 07/14/2016 1400   PROT 6.4 (L) 07/14/2016 1400   ALBUMIN 4.0 07/14/2016 1400   AST 29 07/14/2016 1400   ALT 26 07/14/2016 1400   ALKPHOS 76 07/14/2016 1400   BILITOT 0.5 07/14/2016 1400   GFRNONAA >60 07/14/2016 1400   GFRAA >60 07/14/2016 1400    Lab Results  Component Value Date   TSH 1.633 11/13/2015      ASSESSMENT AND PLAN 55 y.o. year old female has a past medical  history of ; LBP (low back pain); Migraines; ; Syringobulbia ([place]); Hypertension; and Tachycardia. here to follow-up. Her headaches are in  [place]  control but she does not wish to add any additional medications at this time  Continue current preventive medications, Topamax 200 mg twice a day,  Verapamil 120 daily,  Nortriptyline 25 mg every night Imitrex 100mg  prn  Naproxen 500 mg as needed Migraine tracker APP and keep record Follow-up in 6 months.  Dennie Bible, [facility], [facility], APRN  [facility] [address], [place],  [address] [phone]

## 2016-08-21 NOTE — Patient Instructions (Addendum)
Continue current preventive medications, Topamax 200 mg twice a day,  Verapamil 120 daily,  Nortriptyline 25 mg every night Imitrex 100mg  prn  Naproxen 500 mg as needed Follow up 6 months Migraine tracker APP

## 2016-08-25 NOTE — Progress Notes (Signed)
I have reviewed and agreed above plan. 

## 2016-09-02 ENCOUNTER — Ambulatory Visit (INDEPENDENT_AMBULATORY_CARE_PROVIDER_SITE_OTHER): Payer: BLUE CROSS/BLUE SHIELD | Admitting: Internal Medicine

## 2016-09-02 ENCOUNTER — Encounter: Payer: Self-pay | Admitting: Internal Medicine

## 2016-09-02 VITALS — BP 140/98 | HR 75 | Wt 169.0 lb

## 2016-09-02 DIAGNOSIS — M5440 Lumbago with sciatica, unspecified side: Secondary | ICD-10-CM

## 2016-09-02 DIAGNOSIS — G43719 Chronic migraine without aura, intractable, without status migrainosus: Secondary | ICD-10-CM | POA: Diagnosis not present

## 2016-09-02 DIAGNOSIS — Z1211 Encounter for screening for malignant neoplasm of colon: Secondary | ICD-10-CM

## 2016-09-02 DIAGNOSIS — R635 Abnormal weight gain: Secondary | ICD-10-CM | POA: Diagnosis not present

## 2016-09-02 DIAGNOSIS — K5903 Drug induced constipation: Secondary | ICD-10-CM

## 2016-09-02 MED ORDER — IBUPROFEN 600 MG PO TABS: 600.0000 mg | ORAL_TABLET | Freq: Three times a day (TID) | ORAL | 3 refills | Status: DC

## 2016-09-02 MED ORDER — OXYCODONE-ACETAMINOPHEN 7.5-325 MG PO TABS: 1.0000 | ORAL_TABLET | ORAL | 0 refills | Status: DC | PRN

## 2016-09-02 MED ORDER — LUBIPROSTONE 24 MCG PO CAPS: 24.0000 ug | ORAL_CAPSULE | Freq: Two times a day (BID) | ORAL | 11 refills | Status: AC

## 2016-09-02 NOTE — Progress Notes (Signed)
Pre visit review using our clinic review tool, if applicable. No additional management support is needed unless otherwise documented below in the visit note. 

## 2016-09-02 NOTE — Assessment & Plan Note (Signed)
On Percocet

## 2016-09-02 NOTE — Assessment & Plan Note (Signed)
Ibuprofen, Imitrex, Topamax, Nortriptyline

## 2016-09-02 NOTE — Assessment & Plan Note (Signed)
Better  

## 2016-09-02 NOTE — Assessment & Plan Note (Signed)
Start Amitiza. 

## 2016-09-02 NOTE — Progress Notes (Signed)
Subjective:  Patient ID: Beth Huffman, female    DOB: [birth year]  Age: 55 y.o. MRN: [id number]  CC: No chief complaint on file.   HPI LORE LOPEZ presents for LBP, HAs, chronic pain f/u C/o constipation after surgery  Outpatient Medications Prior to Visit  Medication Sig Dispense Refill  . cholecalciferol (VITAMIN D) 1000 UNITS tablet Take 1,000 Units by mouth daily.      . cyclobenzaprine (FLEXERIL) 5 MG tablet TAKE 1 TABLET THREE TIMES DAILY AS NEEDED FOR MUSCLE SPASMS. 90 tablet 1  . estradiol (CLIMARA - DOSED IN MG/24 HR) 0.075 mg/24hr patch Place 0.075 mg onto the skin once a week. PATCH CHANGE ON TUESDAYS    . ibuprofen (ADVIL,MOTRIN) 600 MG tablet TAKE 1 TABLET 3 TIMES A DAY. 90 tablet 0  . Multiple Vitamin (MULTIVITAMIN PO) Take 1 tablet by mouth daily.      . nortriptyline (PAMELOR) 25 MG capsule Take 1 capsule (25 mg total) by mouth at bedtime. 30 capsule 6  . oxyCODONE-acetaminophen (PERCOCET) 7.5-325 MG tablet Take 1 tablet by mouth every 4 (four) hours as needed for severe pain. 30 tablet 0  . Riboflavin (VITAMIN B-2 PO) Take 400 mg by mouth 2 (two) times daily.    . SUMAtriptan (IMITREX) 100 MG tablet TAKE 1 TABLET AT ONSET OF MIGRAINE- MAY REPEAT ONCE IN 2 HOURS. LIMIT2/24 HOURS. AVOID DAILY USE. 9 tablet 6  . topiramate (TOPAMAX) 200 MG tablet TAKE 1 TABLET TWICE DAILY. 60 tablet 11  . verapamil (CALAN-SR) 120 MG CR tablet TAKE ONE TABLET AT BEDTIME. 30 tablet 11   No facility-administered medications prior to visit.     ROS Review of Systems  Constitutional: Negative for activity change, appetite change, chills, fatigue and unexpected weight change.  HENT: Negative for congestion, mouth sores and sinus pressure.   Eyes: Negative for visual disturbance.  Respiratory: Negative for cough and chest tightness.   Gastrointestinal: Negative for abdominal pain and nausea.  Genitourinary: Negative for difficulty urinating, frequency and vaginal pain.    Musculoskeletal: Positive for back pain and neck pain. Negative for gait problem.  Skin: Negative for pallor and rash.  Neurological: Positive for headaches. Negative for dizziness, tremors, weakness and numbness.  Psychiatric/Behavioral: Negative for confusion, sleep disturbance and suicidal ideas. The patient is not nervous/anxious.     Objective:  BP (!) 140/98   Pulse 75   Wt 169 lb (76.7 kg)   SpO2 97%   BMI 27.28 kg/m   BP Readings from Last 3 Encounters:  09/02/16 (!) 140/98  08/21/16 117/76  07/25/16 117/66    Wt Readings from Last 3 Encounters:  09/02/16 169 lb (76.7 kg)  08/21/16 166 lb (75.3 kg)  07/25/16 170 lb (77.1 kg)    Physical Exam  Constitutional: She appears well-developed. No distress.  HENT:  Head: Normocephalic.  Right Ear: External ear normal.  Left Ear: External ear normal.  Nose: Nose normal.  Mouth/Throat: Oropharynx is clear and moist.  Eyes: Conjunctivae are normal. Pupils are equal, round, and reactive to light. Right eye exhibits no discharge. Left eye exhibits no discharge.  Neck: Normal range of motion. Neck supple. No JVD present. No tracheal deviation present. No thyromegaly present.  Cardiovascular: Normal rate, regular rhythm and normal heart sounds.   Pulmonary/Chest: No stridor. No respiratory distress. She has no wheezes.  Abdominal: Soft. Bowel sounds are normal. She exhibits no distension and no mass. There is no tenderness. There is no rebound and no guarding.  Musculoskeletal: She exhibits tenderness. She exhibits no edema.  Lymphadenopathy:    She has no cervical adenopathy.  Neurological: She displays normal reflexes. No cranial nerve deficit. She exhibits normal muscle tone. Coordination normal.  Skin: No rash noted. No erythema.  Psychiatric: She has a normal mood and affect. Her behavior is normal. Judgment and thought content normal.    Lab Results  Component Value Date   WBC 9.2 07/25/2016   HGB 10.7 (L) 07/25/2016    HCT 32.0 (L) 07/25/2016   PLT 186 07/25/2016   GLUCOSE 79 07/14/2016   CHOL 213 (H) 12/21/2013   TRIG 67.0 12/21/2013   HDL 67.80 12/21/2013   LDLDIRECT 130.2 12/21/2013   ALT 26 07/14/2016   AST 29 07/14/2016   NA 138 07/14/2016   K 3.6 07/14/2016   CL 110 07/14/2016   CREATININE 0.88 07/14/2016   BUN 12 07/14/2016   CO2 24 07/14/2016   TSH 1.633 11/13/2015   HGBA1C 5.3 02/25/2012    No results found.  Assessment & Plan:   There are no diagnoses linked to this encounter. I am having Ms. Geske maintain her cholecalciferol, Multiple Vitamin (MULTIVITAMIN PO), estradiol, verapamil, cyclobenzaprine, Riboflavin (VITAMIN B-2 PO), ibuprofen, oxyCODONE-acetaminophen, topiramate, nortriptyline, and SUMAtriptan.  No orders of the defined types were placed in this encounter.    Follow-up: No Follow-up on file.  Walker Kehr, MD

## 2016-09-03 ENCOUNTER — Encounter: Payer: Self-pay | Admitting: Gastroenterology

## 2016-09-09 ENCOUNTER — Telehealth: Payer: Self-pay | Admitting: Internal Medicine

## 2016-09-09 NOTE — Telephone Encounter (Signed)
Pt called in and has a few questions about her meds and would she would like for a nurse to give her a call back

## 2016-09-09 NOTE — Telephone Encounter (Signed)
I called pt- she states her ins has denied Amitiza. She did not say it needed a prior auth. I have Amitiza samples #16 I have put upfront for pt. Pt informed.  Will wait for PA request from pharmacy or f/u with pharmacy for what is further needed.

## 2016-09-10 ENCOUNTER — Telehealth: Payer: Self-pay

## 2016-09-10 NOTE — Telephone Encounter (Signed)
PA initiated via [place].  Form received, completed and faxed back to [phone].

## 2016-09-10 NOTE — Telephone Encounter (Signed)
A user error has taken place: Duplicate

## 2016-09-12 NOTE — Telephone Encounter (Signed)
PA DENIED, pt must first try and fail Linzess. Please advise, thanks!

## 2016-09-14 MED ORDER — LINACLOTIDE 290 MCG PO CAPS: 290.0000 ug | ORAL_CAPSULE | Freq: Every day | ORAL | 11 refills | Status: DC | PRN

## 2016-09-14 NOTE — Telephone Encounter (Signed)
Ok Linzess. Rx emailed Thx

## 2016-09-14 NOTE — Addendum Note (Signed)
Addended by: Cassandria Anger on: 09/14/2016 09:29 PM   Modules accepted: Orders

## 2016-09-15 NOTE — Telephone Encounter (Signed)
Pt advised and agrees to change

## 2016-09-23 ENCOUNTER — Telehealth: Payer: Self-pay | Admitting: *Deleted

## 2016-09-23 MED ORDER — OXYCODONE HCL 15 MG PO TABS: 15.0000 mg | ORAL_TABLET | Freq: Four times a day (QID) | ORAL | 0 refills | Status: DC | PRN

## 2016-09-23 NOTE — Telephone Encounter (Signed)
Pls change to Oxycod 15 mg as before

## 2016-09-23 NOTE — Telephone Encounter (Signed)
Rec'd call pt states MD gave her rx for Percocet, and she normally takes just the Oxycodone 15 mg. She states MD gives her 3 months at a time and which he gave her 3 scripts back in August. Looking at the chart pt went in to have procedure done for her bladder & was rx Percocet 7.5/325mg . She states she only took the percocet after the surgery then she started back on the Oxycodone 15 mg because she also takes Ibuprofen too. Pt is requesting rx for Oxycodone 15 mg...Beth Huffman

## 2016-09-24 NOTE — Telephone Encounter (Signed)
Called pt no answer LMOM MD change rx back to Oxycodone 15 mg rx is ready for pick-up...Beth Huffman

## 2016-10-08 ENCOUNTER — Ambulatory Visit (AMBULATORY_SURGERY_CENTER): Payer: Self-pay

## 2016-10-08 ENCOUNTER — Encounter: Payer: Self-pay | Admitting: Gastroenterology

## 2016-10-08 VITALS — Ht 64.5 in | Wt 170.0 lb

## 2016-10-08 DIAGNOSIS — Z1211 Encounter for screening for malignant neoplasm of colon: Secondary | ICD-10-CM

## 2016-10-08 MED ORDER — SUPREP BOWEL PREP KIT 17.5-3.13-1.6 GM/177ML PO SOLN: 1.0000 | Freq: Once | ORAL | 0 refills | Status: AC

## 2016-10-08 NOTE — Progress Notes (Signed)
No allergies to eggs or soy No past problems with anesthesia No diet meds No home oxygen  Registered for emmi 

## 2016-10-22 ENCOUNTER — Encounter: Payer: Self-pay | Admitting: Gastroenterology

## 2016-10-22 ENCOUNTER — Ambulatory Visit (AMBULATORY_SURGERY_CENTER): Payer: BLUE CROSS/BLUE SHIELD | Admitting: Gastroenterology

## 2016-10-22 VITALS — BP 116/65 | HR 71 | Temp 99.1°F | Resp 12 | Ht 64.5 in | Wt 170.0 lb

## 2016-10-22 DIAGNOSIS — Z1212 Encounter for screening for malignant neoplasm of rectum: Secondary | ICD-10-CM

## 2016-10-22 DIAGNOSIS — Z1211 Encounter for screening for malignant neoplasm of colon: Secondary | ICD-10-CM | POA: Diagnosis present

## 2016-10-22 HISTORY — PX: COLONOSCOPY: SHX174

## 2016-10-22 MED ORDER — SODIUM CHLORIDE 0.9 % IV SOLN: 500.0000 mL | INTRAVENOUS | Status: DC

## 2016-10-22 NOTE — Progress Notes (Signed)
To PACU, vss patent aw report to rn 

## 2016-10-22 NOTE — Op Note (Signed)
[facility] Patient Name: Beth Huffman Procedure Date: 10/22/2016 1:23 PM MRN: [id number] Endoscopist: Remo Lipps P. Sharan Mcenaney MD, MD Age: 55 Referring MD:  Date of Birth: Jun 14, 1961 Gender: Female Account #: [id number] Procedure:                Colonoscopy Indications:              Screening for colorectal malignant neoplasm Medicines:                Monitored Anesthesia Care Procedure:                Pre-Anesthesia Assessment:                           - Prior to the procedure, a History and Physical                            was performed, and patient medications and                            allergies were reviewed. The patient's tolerance of                            previous anesthesia was also reviewed. The risks                            and benefits of the procedure and the sedation                            options and risks were discussed with the patient.                            All questions were answered, and informed consent                            was obtained. Prior Anticoagulants: The patient has                            taken no previous anticoagulant or antiplatelet                            agents. ASA Grade Assessment: II - A patient with                            mild systemic disease. After reviewing the risks                            and benefits, the patient was deemed in                            satisfactory condition to undergo the procedure.                           After obtaining informed consent, the colonoscope  was passed under direct vision. Throughout the                            procedure, the patient's blood pressure, pulse, and                            oxygen saturations were monitored continuously. The                            Model CF-HQ190L [id number]) scope was introduced                            through the anus and advanced to the the cecum,   identified by appendiceal orifice and ileocecal                            valve. The colonoscopy was performed without                            difficulty. The patient tolerated the procedure                            well. The quality of the bowel preparation was                            fair. The ileocecal valve, appendiceal orifice, and                            rectum were photographed. Scope In: 1:30:04 PM Scope Out: 1:52:51 PM Scope Withdrawal Time: 0 hours 15 minutes 10 seconds  Total Procedure Duration: 0 hours 22 minutes 47 seconds  Findings:                 The perianal and digital rectal examinations were                            normal.                           Stool was found in the ascending colon and in the                            cecum, making visualization difficult. Dependant                            portions of the colon had some residual stool which                            clogged the endoscope. Lavage of the area was                            performed. No polyps were noted however in some                            dependant portions, small  or flat polyps may not                            have been appreciated.                           A few medium-mouthed diverticula were found in the                            sigmoid colon.                           The exam was otherwise without abnormality on                            direct and retroflexion views. Complications:            No immediate complications. Estimated blood loss:                            None. Estimated Blood Loss:     Estimated blood loss: none. Impression:               - Preparation of the colon was fair in the right                            colon / cecum.                           - Diverticulosis in the sigmoid colon.                           - The examination was otherwise normal on direct                            and retroflexion views. No polyps Recommendation:            - Patient has a contact number available for                            emergencies. The signs and symptoms of potential                            delayed complications were discussed with the                            patient. Return to normal activities tomorrow.                            Written discharge instructions were provided to the                            patient.                           - Resume previous diet.                           -  Continue present medications.                           - Repeat colonoscopy in 1-2 years because the bowel                            preparation was suboptimal. Remo Lipps P. Clerance Umland MD, MD 10/22/2016 1:57:57 PM This report has been signed electronically.

## 2016-10-22 NOTE — Patient Instructions (Signed)
YOU HAD AN ENDOSCOPIC PROCEDURE TODAY AT [facility]:   Refer to the procedure report that was given to you for any specific questions about what was found during the examination.  If the procedure report does not answer your questions, please call your gastroenterologist to clarify.  If you requested that your care partner not be given the details of your procedure findings, then the procedure report has been included in a sealed envelope for you to review at your convenience later.  YOU SHOULD EXPECT: Some feelings of bloating in the abdomen. Passage of more gas than usual.  Walking can help get rid of the air that was put into your GI tract during the procedure and reduce the bloating. If you had a lower endoscopy (such as a colonoscopy or flexible sigmoidoscopy) you may notice spotting of blood in your stool or on the toilet paper. If you underwent a bowel prep for your procedure, you may not have a normal bowel movement for a few days.  Please Note:  You might notice some irritation and congestion in your nose or some drainage.  This is from the oxygen used during your procedure.  There is no need for concern and it should clear up in a day or so.  SYMPTOMS TO REPORT IMMEDIATELY:   Following lower endoscopy (colonoscopy or flexible sigmoidoscopy):  Excessive amounts of blood in the stool  Significant tenderness or worsening of abdominal pains  Swelling of the abdomen that is new, acute  Fever of 100F or higher  For urgent or emergent issues, a gastroenterologist can be reached at any hour by calling [phone].   DIET:  We do recommend a small meal at first, but then you may proceed to your regular diet.  Drink plenty of fluids but you should avoid alcoholic beverages for 24 hours.  ACTIVITY:  You should plan to take it easy for the rest of today and you should NOT DRIVE or use heavy machinery until tomorrow (because of the sedation medicines used during the test).     FOLLOW UP: Our staff will call the number listed on your records the next business day following your procedure to check on you and address any questions or concerns that you may have regarding the information given to you following your procedure. If we do not reach you, we will leave a message.  However, if you are feeling well and you are not experiencing any problems, there is no need to return our call.  We will assume that you have returned to your regular daily activities without incident.  Repeat Colonoscopy in 1-2 years Diverticulosis (handout given)  If any biopsies were taken you will be contacted by phone or by letter within the next 1-3 weeks.  Please call us at [phone] if you have not heard about the biopsies in 3 weeks.    SIGNATURES/CONFIDENTIALITY: You and/or your care partner have signed paperwork which will be entered into your electronic medical record.  These signatures attest to the fact that that the information above on your After Visit Summary has been reviewed and is understood.  Full responsibility of the confidentiality of this discharge information lies with you and/or your care-partner.

## 2016-10-23 ENCOUNTER — Telehealth: Payer: Self-pay

## 2016-10-23 ENCOUNTER — Telehealth: Payer: Self-pay | Admitting: *Deleted

## 2016-10-23 NOTE — Telephone Encounter (Signed)
  Follow up Call-  Call back number 10/22/2016  Post procedure Call Back phone  # [phone]  Permission to leave phone message Yes  Some recent data might be hidden     Patient questions:  Do you have a fever, pain , or abdominal swelling? No. Pain Score  0 *  Have you tolerated food without any problems? Yes.    Have you been able to return to your normal activities? Yes.    Do you have any questions about your discharge instructions: Diet   No. Medications  No. Follow up visit  No.  Do you have questions or concerns about your Care? No.  Actions: * If pain score is 4 or above: No action needed, pain <4.

## 2016-10-23 NOTE — Telephone Encounter (Signed)
Name identifier. Left message. Will call back later today.

## 2016-10-24 ENCOUNTER — Other Ambulatory Visit: Payer: Self-pay | Admitting: Neurology

## 2016-10-24 ENCOUNTER — Telehealth: Payer: Self-pay | Admitting: Internal Medicine

## 2016-10-24 ENCOUNTER — Other Ambulatory Visit: Payer: Self-pay | Admitting: Internal Medicine

## 2016-10-28 MED ORDER — OXYCODONE HCL 15 MG PO TABS: 15.0000 mg | ORAL_TABLET | Freq: Four times a day (QID) | ORAL | 0 refills | Status: DC | PRN

## 2016-10-28 NOTE — Telephone Encounter (Signed)
done

## 2016-10-28 NOTE — Telephone Encounter (Signed)
Pt left msg on triage needing to get refill on the Oxycodone...Beth Huffman

## 2016-10-29 NOTE — Telephone Encounter (Signed)
Notified pt rx ready for pick-up.../lmb 

## 2016-11-26 ENCOUNTER — Telehealth: Payer: Self-pay | Admitting: *Deleted

## 2016-11-26 MED ORDER — OXYCODONE HCL 15 MG PO TABS: 15.0000 mg | ORAL_TABLET | Freq: Four times a day (QID) | ORAL | 0 refills | Status: DC | PRN

## 2016-11-26 NOTE — Telephone Encounter (Signed)
Rec'll pt requesting refills on her Oxycodone.Marland KitchenJohny Huffman

## 2016-11-26 NOTE — Telephone Encounter (Signed)
OK to fill this/these prescription with additional refills x0 Needs to have an OV every 3 months - pls sch Thank you!

## 2016-11-26 NOTE — Telephone Encounter (Signed)
Called pt no answer LMOM w/MD response place script up front for pick-up...Beth Huffman

## 2016-11-28 ENCOUNTER — Ambulatory Visit (INDEPENDENT_AMBULATORY_CARE_PROVIDER_SITE_OTHER): Payer: BLUE CROSS/BLUE SHIELD | Admitting: Internal Medicine

## 2016-11-28 ENCOUNTER — Encounter: Payer: Self-pay | Admitting: Internal Medicine

## 2016-11-28 DIAGNOSIS — G43719 Chronic migraine without aura, intractable, without status migrainosus: Secondary | ICD-10-CM | POA: Diagnosis not present

## 2016-11-28 DIAGNOSIS — J0100 Acute maxillary sinusitis, unspecified: Secondary | ICD-10-CM

## 2016-11-28 DIAGNOSIS — M5440 Lumbago with sciatica, unspecified side: Secondary | ICD-10-CM | POA: Diagnosis not present

## 2016-11-28 DIAGNOSIS — G950 Syringomyelia and syringobulbia: Secondary | ICD-10-CM | POA: Diagnosis not present

## 2016-11-28 MED ORDER — AZITHROMYCIN 250 MG PO TABS: ORAL_TABLET | ORAL | 0 refills | Status: DC

## 2016-11-28 MED ORDER — OXYCODONE HCL 15 MG PO TABS: 15.0000 mg | ORAL_TABLET | Freq: Four times a day (QID) | ORAL | 0 refills | Status: DC | PRN

## 2016-11-28 NOTE — Assessment & Plan Note (Signed)
Zpac 

## 2016-11-28 NOTE — Progress Notes (Signed)
Pre visit review using our clinic review tool, if applicable. No additional management support is needed unless otherwise documented below in the visit note. 

## 2016-11-28 NOTE — Assessment & Plan Note (Signed)
Pain management 

## 2016-11-28 NOTE — Patient Instructions (Signed)
Use over-the-counter  "cold" medicines  such as"Afrin" nasal spray for nasal congestion as directed instead. Use " Delsym" or" Robitussin" cough syrup varietis for cough.  You can use plain "Tylenol" or "Advil" for fever, chills and achyness. Use Halls or Ricola cough drops.  Please, make an appointment if you are not better or if you're worse.  

## 2016-11-28 NOTE — Assessment & Plan Note (Signed)
On Oxy  Potential benefits of a long term opioids use as well as potential risks (i.e. addiction risk, apnea etc) and complications (i.e. Somnolence, constipation and others) were explained to the patient and were aknowledged.

## 2016-11-28 NOTE — Assessment & Plan Note (Signed)
Nortript, Imitrex

## 2016-11-28 NOTE — Progress Notes (Signed)
Subjective:  Patient ID: Beth Huffman, female    DOB: 1961-05-17  Age: 56 y.o. MRN: [id number]  CC: No chief complaint on file.   HPI   Beth Huffman presents for a URI sx's, LBP, migraines f/u   Outpatient Medications Prior to Visit  Medication Sig Dispense Refill  . cholecalciferol (VITAMIN D) 1000 UNITS tablet Take 1,000 Units by mouth daily.      . cyclobenzaprine (FLEXERIL) 5 MG tablet TAKE 1 TABLET THREE TIMES DAILY AS NEEDED FOR MUSCLE SPASMS. 90 tablet 0  . estradiol (CLIMARA - DOSED IN MG/24 HR) 0.075 mg/24hr patch Place 0.075 mg onto the skin once a week. PATCH CHANGE ON TUESDAYS    . ibuprofen (ADVIL,MOTRIN) 600 MG tablet Take 1 tablet (600 mg total) by mouth 3 (three) times daily. 90 tablet 3  . linaclotide (LINZESS) 290 MCG CAPS capsule Take 1 capsule (290 mcg total) by mouth daily as needed. 30 capsule 11  . Multiple Vitamin (MULTIVITAMIN PO) Take 1 tablet by mouth daily.      . nortriptyline (PAMELOR) 25 MG capsule Take 1 capsule (25 mg total) by mouth at bedtime. 30 capsule 6  . ondansetron (ZOFRAN) 8 MG tablet TAKE 1 TABLET EVERY 12 HOURS IF NEEDED FOR NAUSEA. 20 tablet 0  . oxyCODONE (ROXICODONE) 15 MG immediate release tablet Take 1 tablet (15 mg total) by mouth every 6 (six) hours as needed for pain. 120 tablet 0  . Riboflavin (VITAMIN B-2 PO) Take 400 mg by mouth 2 (two) times daily.    . SUMAtriptan (IMITREX) 100 MG tablet TAKE 1 TABLET AT ONSET OF MIGRAINE- MAY REPEAT ONCE IN 2 HOURS. LIMIT2/24 HOURS. AVOID DAILY USE. 9 tablet 6  . topiramate (TOPAMAX) 200 MG tablet TAKE 1 TABLET TWICE DAILY. 60 tablet 11  . verapamil (CALAN-SR) 120 MG CR tablet TAKE ONE TABLET AT BEDTIME. 30 tablet 11   Facility-Administered Medications Prior to Visit  Medication Dose Route Frequency Provider Last Rate Last Dose  . 0.9 %  sodium chloride infusion  500 mL Intravenous Continuous Manus Gunning, MD        ROS Review of Systems  Constitutional: Positive for  fatigue. Negative for activity change, appetite change, chills and unexpected weight change.  HENT: Negative for congestion, mouth sores and sinus pressure.   Eyes: Negative for visual disturbance.  Respiratory: Negative for cough and chest tightness.   Gastrointestinal: Negative for abdominal pain and nausea.  Genitourinary: Negative for difficulty urinating, frequency and vaginal pain.  Musculoskeletal: Positive for back pain. Negative for gait problem.  Skin: Negative for pallor and rash.  Neurological: Negative for dizziness, tremors, weakness, numbness and headaches.  Psychiatric/Behavioral: Negative for confusion, sleep disturbance and suicidal ideas.    Objective:  BP 138/80   Pulse 78   Temp 97.8 F (36.6 C) (Oral)   Resp 14   Ht 5\' 4"  (1.626 m)   Wt 166 lb (75.3 kg)   SpO2 98%   BMI 28.49 kg/m   BP Readings from Last 3 Encounters:  11/28/16 138/80  10/22/16 116/65  09/02/16 (!) 140/98    Wt Readings from Last 3 Encounters:  11/28/16 166 lb (75.3 kg)  10/22/16 170 lb (77.1 kg)  10/08/16 170 lb (77.1 kg)    Physical Exam  Constitutional: She appears well-developed. No distress.  HENT:  Head: Normocephalic.  Right Ear: External ear normal.  Left Ear: External ear normal.  Nose: Nose normal.  Mouth/Throat: Oropharyngeal exudate present.  Eyes: Conjunctivae  are normal. Pupils are equal, round, and reactive to light. Right eye exhibits no discharge. Left eye exhibits no discharge.  Neck: Normal range of motion. Neck supple. No JVD present. No tracheal deviation present. No thyromegaly present.  Cardiovascular: Normal rate, regular rhythm and normal heart sounds.   Pulmonary/Chest: No stridor. No respiratory distress. She has no wheezes.  Abdominal: Soft. Bowel sounds are normal. She exhibits no distension and no mass. There is no tenderness. There is no rebound and no guarding.  Musculoskeletal: She exhibits tenderness. She exhibits no edema.  Lymphadenopathy:     She has no cervical adenopathy.  Neurological: She displays normal reflexes. No cranial nerve deficit. She exhibits normal muscle tone. Coordination normal.  Skin: No rash noted. No erythema.  Psychiatric: She has a normal mood and affect. Her behavior is normal. Judgment and thought content normal.  LS tender Eryth throat  Lab Results  Component Value Date   WBC 9.2 07/25/2016   HGB 10.7 (L) 07/25/2016   HCT 32.0 (L) 07/25/2016   PLT 186 07/25/2016   GLUCOSE 79 07/14/2016   CHOL 213 (H) 12/21/2013   TRIG 67.0 12/21/2013   HDL 67.80 12/21/2013   LDLDIRECT 130.2 12/21/2013   ALT 26 07/14/2016   AST 29 07/14/2016   NA 138 07/14/2016   K 3.6 07/14/2016   CL 110 07/14/2016   CREATININE 0.88 07/14/2016   BUN 12 07/14/2016   CO2 24 07/14/2016   TSH 1.633 11/13/2015   HGBA1C 5.3 02/25/2012    No results found.  Assessment & Plan:   There are no diagnoses linked to this encounter. I am having [name] maintain her cholecalciferol, Multiple Vitamin (MULTIVITAMIN PO), estradiol, verapamil, Riboflavin (VITAMIN B-2 PO), topiramate, nortriptyline, SUMAtriptan, ibuprofen, linaclotide, ondansetron, cyclobenzaprine, and oxyCODONE. We will continue to administer sodium chloride.  No orders of the defined types were placed in this encounter.    Follow-up: No Follow-up on file.  Walker Kehr, MD

## 2016-12-04 ENCOUNTER — Ambulatory Visit: Payer: BLUE CROSS/BLUE SHIELD | Admitting: Internal Medicine

## 2017-01-05 ENCOUNTER — Other Ambulatory Visit: Payer: Self-pay | Admitting: Internal Medicine

## 2017-01-07 DIAGNOSIS — Z0271 Encounter for disability determination: Secondary | ICD-10-CM

## 2017-01-13 DIAGNOSIS — H2513 Age-related nuclear cataract, bilateral: Secondary | ICD-10-CM | POA: Diagnosis not present

## 2017-01-13 DIAGNOSIS — H43811 Vitreous degeneration, right eye: Secondary | ICD-10-CM | POA: Diagnosis not present

## 2017-01-17 ENCOUNTER — Other Ambulatory Visit: Payer: Self-pay | Admitting: Internal Medicine

## 2017-01-30 ENCOUNTER — Other Ambulatory Visit: Payer: Self-pay | Admitting: Internal Medicine

## 2017-02-02 ENCOUNTER — Other Ambulatory Visit: Payer: Self-pay | Admitting: *Deleted

## 2017-02-02 ENCOUNTER — Other Ambulatory Visit: Payer: Self-pay | Admitting: Obstetrics & Gynecology

## 2017-02-02 DIAGNOSIS — Z1231 Encounter for screening mammogram for malignant neoplasm of breast: Secondary | ICD-10-CM

## 2017-02-02 MED ORDER — IBUPROFEN 600 MG PO TABS: 600.0000 mg | ORAL_TABLET | Freq: Three times a day (TID) | ORAL | 2 refills | Status: DC

## 2017-02-02 NOTE — Telephone Encounter (Signed)
Rec'd call pt requesting refill on her ibuprofen. Verified chart & pharmacy inform pt will send to [place]...Johny Chess

## 2017-02-18 ENCOUNTER — Other Ambulatory Visit: Payer: Self-pay | Admitting: Internal Medicine

## 2017-02-19 ENCOUNTER — Encounter: Payer: Self-pay | Admitting: Nurse Practitioner

## 2017-02-19 ENCOUNTER — Ambulatory Visit (INDEPENDENT_AMBULATORY_CARE_PROVIDER_SITE_OTHER): Payer: BLUE CROSS/BLUE SHIELD | Admitting: Nurse Practitioner

## 2017-02-19 ENCOUNTER — Ambulatory Visit
Admission: RE | Admit: 2017-02-19 | Discharge: 2017-02-19 | Disposition: A | Discharge status: Home / Self Care | Payer: BLUE CROSS/BLUE SHIELD | Source: Ambulatory Visit | Attending: Obstetrics & Gynecology | Admitting: Obstetrics & Gynecology

## 2017-02-19 VITALS — BP 125/75 | HR 80 | Wt 171.2 lb

## 2017-02-19 DIAGNOSIS — G43719 Chronic migraine without aura, intractable, without status migrainosus: Secondary | ICD-10-CM | POA: Diagnosis not present

## 2017-02-19 DIAGNOSIS — Z1231 Encounter for screening mammogram for malignant neoplasm of breast: Secondary | ICD-10-CM

## 2017-02-19 MED ORDER — SUMATRIPTAN SUCCINATE 100 MG PO TABS: ORAL_TABLET | ORAL | 6 refills | Status: DC

## 2017-02-19 MED ORDER — NORTRIPTYLINE HCL 10 MG PO CAPS: 10.0000 mg | ORAL_CAPSULE | Freq: Every day | ORAL | 6 refills | Status: DC

## 2017-02-19 NOTE — Progress Notes (Signed)
[facility]  PATIENT: Beth Huffman DOB: 25-Mar-1961   REASON FOR VISIT: follow up for migraine,  HISTORY FROM: Patient    HISTORY OF PRESENT ILLNESS: HISTORY: Beth Huffman is a 56 years old right-handed female, referred by her primary care physician Dr. Lauraine Rinne evaluation of chronic headaches I saw her previously in May 2012 for similar complaints, she presented with chronic headaches since early 1990s, extensive imaging study in the past showed evidence of giant cistern magna, she underwent occipital posterior fossa decompression surgery in [place] in 2005 with some improvement of her headaches.  I have reviewed MRI report in 1994, from [facility] prior to her posterior fossa decompression surgery, showed a large CSF signal density in the posterior fossa, posterior to the vermis, in the midline. 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 Additional syrinx is present from T3-T12 levels enlarging to maximum diameter at the T4 level measuring 65mm and at the T10 level measuring 31mm. Incidental note made of bilateral perineural cysts at T10 level.  CT cystogram in 2007, following decompression surgery: Again demonstrated is a cystic structure in the posterior fossa. There is a mega cisterna magna present. In addition the is a more focal collection within the posterior fossa. This area completely fills with contrast with no differential filling demonstrated to suggest residual cyst. A small amount of interventricular contrast is demonstrated. There is no hydrocephalus. There is no other intracranial mass or mass-effect. Postoperative changes of a suboccipital craniotomy are present.  EMG nerve conduction study in 2007, evidence of mild bilateral carpal tunnel  syndromes.  Since May 2016, she had more severe, frequent headaches, bilateral area severe pounding headaches, lasting for few days, failed to improved by Tylenol, ibuprofen, Zofran, BC powder, repeat dose of Imitrex, eventually presented to urgent care, her headache was improved after receiving Demerol, Phenergan shots, sleeping, She continues to have headaches 2-3 times each week, lasting few hours to 2 days, bilateral retro-orbital area severe pounding headache with associated light noise sensitivity, nauseous,  Over the years, she has tried different preventive medications, currently still taking Topamax 200 mg twice a day, verapamil 120 mg daily, has not tried Inderal, nortriptyline, questionable benefit from limited trial of Botox in the past, Trigger for her headaches are weather change, oversleep, stress, For abortive treatment, she has tried Maxalt, Relpax, Zomig Currently she is taking Imitrex 100 mg 10 tablets each months,  UPDATE July 27th 2016 Her headache has much improved, she has average 4-5 severe headaches each months, Imitrex half to one tablets as needed, plus naproxen 500 mg as needed has been helpful, her insurance does not cover [place]  She is overall happy about current progress, will stay on Topamax 200 mg twice a day, verapamil 120 mg a day, and nortriptyline, she complains mild dizziness, left ear reining recently, no hearing loss UPDATE 04/26/2017CM:  Beth Huffman, 56 year old female returns for follow-up. She was last seen in the office 08/22/15.  She has had a total of 5 headaches in the last month but not all of them were severe. Most of them occur after she has had a long day at work and she has been doing lifting. 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  She is still overall  pleased with her current progress and does not wish to have any additional medications added. Imitrex works acutely. Her triggers are stress and weather changes. After her  business is closed she wants to think about getting off of some of her migraine preventive medications. She returns for reevaluation UPDATE 10/26/2017CM Beth Huffman, 56 year old female returns for follow-up. She has a history of migraine headaches and tension headaches. She has had a total of 10 headaches in the last month. 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 She has been just about and renovated a  Home, still have not moved in. She had recent bladder surgery. Her migraine triggers are stress and weather changes. She is currently on Topamax 200 mg twice daily by her primary care. In addition she is on nortriptyline 25 at bedtime and Imitrex acutely. She returns for reevaluation UPDATE 04/26/2018CM Beth Huffman, 56 year old female returns for follow-up history of migraines and tension headaches. She continues to have about 5-10 headaches per month. She remains on Topamax 200 twice daily by her primary care. She also takes Imitrex acutely however she only gets 9 pills per month. She is also on verapamil 120 half tablet at bedtime. She stopped her Pamelor due to excessive dry mouth. She was taking 25 mg at bedtime. Her migraine triggers are weather changes and stress. She tells me today she's been taking a lot of ibuprofen alternating with BC powders and Excedrin Migraine. She was made aware of these medications cause rebound headache. She is continuing to exercise by walking. She returns for reevaluation  REVIEW OF SYSTEMS: Full 14 system review of systems performed and notable only for those listed, all others are neg:  Constitutional: neg  Cardiovascular: neg Ear/Nose/Throat: neg  Skin: neg Eyes: Light sensitivity Respiratory: neg Gastroitestinal: Urinary Urgency recent bladder surgery Hematology/Lymphatic: neg  Endocrine: neg Musculoskeletal: Knee pain Allergy/Immunology: neg Neurological: Headache Psychiatric: neg Sleep : neg   ALLERGIES: Allergies    Allergen Reactions  . Atenolol     Wt gain  . Cefuroxime Axetil     REACTION: nausea - but tolerates PCN  . Codeine Sulfate Nausea And Vomiting and Nausea Only  . Fentanyl Nausea And Vomiting    REACTION: bad reaction  . Nitrofurantoin     REACTION: HA  . Pregabalin     REACTION: cp  . Rapaflo [Silodosin]     Orthostatic BP drop, near syncope   . Tramadol Hcl Nausea And Vomiting    REACTION: sick    HOME MEDICATIONS: Outpatient Medications Prior to Visit  Medication Sig Dispense Refill  . cholecalciferol (VITAMIN D) 1000 UNITS tablet Take 1,000 Units by mouth daily.      . cyclobenzaprine (FLEXERIL) 5 MG tablet TAKE 1 TABLET THREE TIMES DAILY AS NEEDED FOR MUSCLE SPASMS. 90 tablet 0  . estradiol (CLIMARA - DOSED IN MG/24 HR) 0.075 mg/24hr patch Place 0.075 mg onto the skin once a week. PATCH CHANGE ON TUESDAYS    . ibuprofen (ADVIL,MOTRIN) 600 MG tablet Take 1 tablet (600 mg total) by mouth 3 (three) times daily. 90 tablet 2  . linaclotide (LINZESS) 290 MCG CAPS capsule Take 1 capsule (290 mcg total) by mouth daily as needed. 30 capsule 11  . Multiple Vitamin (MULTIVITAMIN PO) Take 1 tablet by mouth daily.      . ondansetron (ZOFRAN) 8 MG tablet TAKE 1 TABLET EVERY 12 HOURS IF NEEDED FOR NAUSEA. 20 tablet 0  . oxyCODONE (ROXICODONE) 15 MG immediate release  tablet Take 1 tablet (15 mg total) by mouth every 6 (six) hours as needed for pain. 120 tablet 0  . Riboflavin (VITAMIN B-2 PO) Take 400 mg by mouth 2 (two) times daily.    . SUMAtriptan (IMITREX) 100 MG tablet TAKE 1 TABLET AT ONSET OF MIGRAINE- MAY REPEAT ONCE IN 2 HOURS. LIMIT2/24 HOURS. AVOID DAILY USE. 9 tablet 6  . topiramate (TOPAMAX) 200 MG tablet TAKE 1 TABLET TWICE DAILY. 60 tablet 11  . verapamil (CALAN-SR) 120 MG CR tablet TAKE ONE TABLET AT BEDTIME. 30 tablet 0  . nortriptyline (PAMELOR) 25 MG capsule Take 1 capsule (25 mg total) by mouth at bedtime. (Patient not taking: Reported on 02/19/2017) 30 capsule 6  .  azithromycin (ZITHROMAX Z-PAK) 250 MG tablet As directed 6 each 0   Facility-Administered Medications Prior to Visit  Medication Dose Route Frequency Provider Last Rate Last Dose  . 0.9 %  sodium chloride infusion  500 mL Intravenous Continuous Manus Gunning, MD        PAST MEDICAL HISTORY: Past Medical History:  Diagnosis Date  . Abnormal weight gain   . GERD (gastroesophageal reflux disease)   . Hypertension   . LBP (low back pain)   . Migraines   . Pyelonephritis 2008  . Stress 2009  . Syringobulbia ([place])   . Syringomyelia ([place])   . Tachycardia   . UTI (lower urinary tract infection)     PAST SURGICAL HISTORY: Past Surgical History:  Procedure Laterality Date  . ABDOMINAL HYSTERECTOMY    . ANTERIOR AND POSTERIOR REPAIR N/A 07/24/2016   Procedure: Possible ANTERIOR (CYSTOCELE) repair, perineoplasty;  Surgeon: Princess Bruins, MD;  Location: [facility];  Service: Gynecology;  Laterality: N/A;  . BLADDER SUSPENSION N/A 07/24/2016   Procedure: TRANSVAGINAL TAPE (TVT) PROCEDURE;  Surgeon: Princess Bruins, MD;  Location: [facility];  Service: Gynecology;  Laterality: N/A;  . COLONOSCOPY    . CYSTOSCOPY N/A 07/24/2016   Procedure: CYSTOSCOPY;  Surgeon: Princess Bruins, MD;  Location: [facility];  Service: Gynecology;  Laterality: N/A;  . intracranial decompression surgery  2006  . ROBOTIC ASSISTED LAPAROSCOPIC SACROCOLPOPEXY N/A 07/24/2016   Procedure: ROBOTIC ASSISTED LAPAROSCOPIC SACROCOLPOPEXY WITH PERINEOPLASTY;  Surgeon: Princess Bruins, MD;  Location: [facility];  Service: Gynecology;  Laterality: N/A;  . TUBAL LIGATION      FAMILY HISTORY: Family History  Problem Relation Age of Onset  . COPD Mother   . Peripheral vascular disease Mother   . Hypertension Mother   . Heart disease Mother   . Hypertension Father   . Anxiety disorder Other   . Heart attack Maternal Grandmother   . Stroke Maternal Grandfather   . Stroke Paternal Grandfather   . Colon cancer Maternal  Uncle 30    SOCIAL HISTORY: Social History   Social History  . Marital status: Married    Spouse name: N/A  . Number of children: 2  . Years of education: College   Occupational History  . small business owner Self-Employed   Social History Main Topics  . Smoking status: Never Smoker  . Smokeless tobacco: Never Used  . Alcohol use No  . Drug use: No  . Sexual activity: Yes   Other Topics Concern  . Not on file   Social History Narrative   Lives at home with her husband.   Right-handed.   1 cup caffeine per day.     PHYSICAL EXAM  Vitals:   02/19/17 1032  BP: 125/75  Pulse: 80  Weight:  171 lb 3.2 oz (77.7 kg)   Body mass index is 29.39 kg/m. Generalized: Well developed, Obese female in no acute distress  Head: normocephalic and atraumatic,. Oropharynx benign  Neck: Supple, no carotid bruits  Cardiac: Regular rate rhythm, no murmur  Musculoskeletal: No deformity   Neurological examination   Mentation: Alert oriented to time, place, history taking. Attention span and concentration appropriate. Recent and remote memory intact. Follows all commands speech and language fluent.   Cranial nerve II-XII: Pupils were equal round reactive to light extraocular movements were full, visual field were full on confrontational test. Facial sensation and strength were normal. hearing was intact to finger rubbing bilaterally. Uvula tongue midline. head turning and shoulder shrug were normal and symmetric.Tongue protrusion into cheek strength was normal. Motor: normal bulk and tone, full strength in the BUE, BLE, fine finger movements normal, no pronator drift. No focal weakness Sensory: normal and symmetric to light touch, pinprick, and Vibration, in the  upper and lower extremities Coordination: finger-nose-finger, heel-to-shin bilaterally, no dysmetria Reflexes: Symmetric upper and lower, plantar responses were flexor bilaterally. Gait and Station: Rising up from seated  position without assistance, normal stance, moderate stride, good arm swing, smooth turning, able to perform tiptoe, and heel walking without difficulty. Tandem gait is steady  DIAGNOSTIC DATA (LABS, IMAGING, TESTING) - I reviewed patient records, labs, notes, testing and imaging myself where available.  Lab Results  Component Value Date   WBC 9.2 07/25/2016   HGB 10.7 (L) 07/25/2016   HCT 32.0 (L) 07/25/2016   MCV 92.8 07/25/2016   PLT 186 07/25/2016      Component Value Date/Time   NA 138 07/14/2016 1400   K 3.6 07/14/2016 1400   CL 110 07/14/2016 1400   CO2 24 07/14/2016 1400   GLUCOSE 79 07/14/2016 1400   BUN 12 07/14/2016 1400   CREATININE 0.88 07/14/2016 1400   CREATININE 0.79 11/13/2015 1520   CALCIUM 8.7 (L) 07/14/2016 1400   PROT 6.4 (L) 07/14/2016 1400   ALBUMIN 4.0 07/14/2016 1400   AST 29 07/14/2016 1400   ALT 26 07/14/2016 1400   ALKPHOS 76 07/14/2016 1400   BILITOT 0.5 07/14/2016 1400   GFRNONAA >60 07/14/2016 1400   GFRAA >60 07/14/2016 1400    Lab Results  Component Value Date   TSH 1.633 11/13/2015      ASSESSMENT AND PLAN 56 y.o. year old female has a past medical history of ; LBP (low back pain); Migraines; ;  Hypertension; and Tachycardia. here to follow-up. Her headaches are in  [employer] She is having approximately 5-10 headaches per month however she is using a lot of over-the-counter medications that may be causing rebound. In addition she has stopped her nortriptyline 25 mg at night due to dry mouth.  Continue current preventive medications, Topamax 200 mg twice a day,  Verapamil 120 daily, 1/2 at bedtime Try Nortriptyline 10mg  at bedtime hopefully this will have less dry mouth side effects Imitrex 100mg  prn will refill Migraine tracker APP and keep record Stop OTC meds as they cause rebound Follow-up in 6 months. I spent 25 min in total face to face time with the patient more than 50% of which was spent counseling and  coordination of care, reviewing test results reviewing medications and discussing and reviewing the diagnosis of migraine and further treatment options. Along with rebound headache due to over-the-counter pain medications. Reviewed patient handout.  Dennie Bible, [facility], [facility], APRN  [facility] [address], [address]  [place], [place] [address] [phone]

## 2017-02-19 NOTE — Patient Instructions (Addendum)
Continue current preventive medications, Topamax 200 mg twice a day,  Verapamil 120 daily, 1/2 at bedtime Try Nortriptyline 10mg  at bedtime  Imitrex 100mg  prn will refill Migraine tracker APP and keep record Stop OTC meds as they cause rebound Follow-up in 6 months.   Analgesic Rebound Headache An analgesic rebound headache, sometimes called a medication overuse headache, is a headache that comes after pain medicine (analgesic) taken to treat the original (primary) headache has worn off. Any type of primary headache can return as a rebound headache if a person regularly takes analgesics more than three times a week to treat it. The types of primary headaches that are commonly associated with rebound headaches include:  Migraines.  Headaches that arise from tense muscles in the head and neck area (tension headaches).  Headaches that develop and happen again (recur) on one side of the head and around the eye (cluster headaches). If rebound headaches continue, they become chronic daily headaches. What are the causes? This condition may be caused by frequent use of:  Over-the-counter medicines such as aspirin, ibuprofen, and acetaminophen.  Sinus relief medicines and other medicines that contain caffeine.  Narcotic pain medicines such as codeine and oxycodone. What are the signs or symptoms? The symptoms of a rebound headache are the same as the symptoms of the original headache. Some of the symptoms of specific types of headaches include: Migraine headache   Pulsing or throbbing pain on one or both sides of the head.  Severe pain that interferes with daily activities.  Pain that is worsened by physical activity.  Nausea, vomiting, or both.  Pain with exposure to bright light, loud noises, or strong smells.  General sensitivity to bright light, loud noises, or strong smells.  Visual changes.  Numbness of one or both arms. Tension headache   Pressure around the  head.  Dull, aching head pain.  Pain felt over the front and sides of the head.  Tenderness in the muscles of the head, neck, and shoulders. Cluster headache   Severe pain that begins in or around one eye or temple.  Redness and tearing in the eye on the same side as the pain.  Droopy or swollen eyelid.  One-sided head pain.  Nausea.  Runny nose.  Sweaty, pale facial skin.  Restlessness. How is this diagnosed? This condition is diagnosed by:  Reviewing your medical history. This includes the nature of your primary headaches.  Reviewing the types of pain medicines that you have been using to treat your headaches and how often you take them. How is this treated? This condition may be treated or managed by:  Discontinuing frequent use of the analgesic medicine. Doing this may worsen your headaches at first, but the pain should eventually become more manageable, less frequent, and less severe.  Seeing a headache specialist. He or she may be able to help you manage your headaches and help make sure there is not another cause of the headaches.  Using methods of stress relief, such as acupuncture, counseling, biofeedback, and massage. Talk with your health care provider about which methods might be good for you. Follow these instructions at home:  Take over-the-counter and prescription medicines only as told by your health care provider.  Stop the repeated use of pain medicine as told by your health care provider. Stopping can be difficult. Carefully follow instructions from your health care provider.  Avoid triggers that are known to cause your primary headaches.  Keep all follow-up visits as told by your  health care provider. This is important. Contact a health care provider if:  You continue to experience headaches after following treatments that your health care provider recommended. Get help right away if:  You develop new headache pain.  You develop headache pain  that is different than what you have experienced in the past.  You develop numbness or tingling in your arms or legs.  You develop changes in your speech or vision. This information is not intended to replace advice given to you by your health care provider. Make sure you discuss any questions you have with your health care provider. Document Released: 01/03/2004 Document Revised: 05/02/2016 Document Reviewed: 03/17/2016 Elsevier Interactive Patient Education  2017 [employer].

## 2017-02-19 NOTE — Progress Notes (Signed)
I have reviewed and agreed above plan. 

## 2017-02-26 ENCOUNTER — Telehealth: Payer: Self-pay | Admitting: Internal Medicine

## 2017-02-26 ENCOUNTER — Ambulatory Visit: Payer: BLUE CROSS/BLUE SHIELD | Admitting: Internal Medicine

## 2017-02-26 ENCOUNTER — Ambulatory Visit (INDEPENDENT_AMBULATORY_CARE_PROVIDER_SITE_OTHER): Payer: BLUE CROSS/BLUE SHIELD | Admitting: Internal Medicine

## 2017-02-26 ENCOUNTER — Encounter: Payer: Self-pay | Admitting: Internal Medicine

## 2017-02-26 VITALS — BP 128/82 | HR 76 | Temp 97.6°F | Ht 64.0 in | Wt 171.0 lb

## 2017-02-26 DIAGNOSIS — J3501 Chronic tonsillitis: Secondary | ICD-10-CM

## 2017-02-26 DIAGNOSIS — Z0000 Encounter for general adult medical examination without abnormal findings: Secondary | ICD-10-CM | POA: Diagnosis not present

## 2017-02-26 DIAGNOSIS — G43719 Chronic migraine without aura, intractable, without status migrainosus: Secondary | ICD-10-CM

## 2017-02-26 DIAGNOSIS — G950 Syringomyelia and syringobulbia: Secondary | ICD-10-CM | POA: Diagnosis not present

## 2017-02-26 DIAGNOSIS — M5440 Lumbago with sciatica, unspecified side: Secondary | ICD-10-CM

## 2017-02-26 HISTORY — DX: Chronic tonsillitis: J35.01

## 2017-02-26 MED ORDER — OXYCODONE HCL 15 MG PO TABS: 15.0000 mg | ORAL_TABLET | Freq: Four times a day (QID) | ORAL | 0 refills | Status: DC | PRN

## 2017-02-26 MED ORDER — VERAPAMIL HCL ER 120 MG PO TBCR: 120.0000 mg | EXTENDED_RELEASE_TABLET | Freq: Every day | ORAL | 0 refills | Status: DC

## 2017-02-26 MED ORDER — LINACLOTIDE 72 MCG PO CAPS: 72.0000 ug | ORAL_CAPSULE | Freq: Every day | ORAL | 3 refills | Status: DC

## 2017-02-26 NOTE — Telephone Encounter (Signed)
Pt had a Beth Huffman appt today and he was running behind so she RS appt, she is now on 04-08-17. She needs a refill of Verapamil 120mg  to [facility] for 30 days

## 2017-02-26 NOTE — Assessment & Plan Note (Signed)
Worse ENT ref 

## 2017-02-26 NOTE — Telephone Encounter (Signed)
Per chart MD has sent rx to [facility] w/lower dose...Beth Huffman

## 2017-02-26 NOTE — Progress Notes (Signed)
Pre visit review using our clinic review tool, if applicable. No additional management support is needed unless otherwise documented below in the visit note. 

## 2017-02-26 NOTE — Telephone Encounter (Signed)
Pt wanted to make sure that Dr Alain Marion sent in the prescription for [facility] to [facility]. He said that he was going to decrease the dose.

## 2017-02-26 NOTE — Assessment & Plan Note (Signed)
Due to syringomyelia On Oxycodone  Potential benefits of a long term opioids use as well as potential risks (i.e. addiction risk, apnea etc) and complications (i.e. Somnolence, constipation and others) were explained to the patient and were aknowledged. 

## 2017-02-26 NOTE — Progress Notes (Signed)
Subjective:  Patient ID: Beth Huffman, female    DOB: 01-18-1961  Age: 56 y.o. MRN: [id number]  CC: No chief complaint on file.   HPI Beth Huffman presents for syringomyelia, constipation, HAs f/u C/o ST - frequent   Outpatient Medications Prior to Visit  Medication Sig Dispense Refill  . cholecalciferol (VITAMIN D) 1000 UNITS tablet Take 1,000 Units by mouth daily.      . cyclobenzaprine (FLEXERIL) 5 MG tablet TAKE 1 TABLET THREE TIMES DAILY AS NEEDED FOR MUSCLE SPASMS. 90 tablet 0  . estradiol (CLIMARA - DOSED IN MG/24 HR) 0.075 mg/24hr patch Place 0.075 mg onto the skin once a week. PATCH CHANGE ON TUESDAYS    . ibuprofen (ADVIL,MOTRIN) 600 MG tablet Take 1 tablet (600 mg total) by mouth 3 (three) times daily. 90 tablet 2  . linaclotide (LINZESS) 290 MCG CAPS capsule Take 1 capsule (290 mcg total) by mouth daily as needed. 30 capsule 11  . Multiple Vitamin (MULTIVITAMIN PO) Take 1 tablet by mouth daily.      . nortriptyline (PAMELOR) 10 MG capsule Take 1 capsule (10 mg total) by mouth at bedtime. 30 capsule 6  . ondansetron (ZOFRAN) 8 MG tablet TAKE 1 TABLET EVERY 12 HOURS IF NEEDED FOR NAUSEA. 20 tablet 0  . oxyCODONE (ROXICODONE) 15 MG immediate release tablet Take 1 tablet (15 mg total) by mouth every 6 (six) hours as needed for pain. 120 tablet 0  . Riboflavin (VITAMIN B-2 PO) Take 400 mg by mouth 2 (two) times daily.    . SUMAtriptan (IMITREX) 100 MG tablet TAKE 1 TABLET AT ONSET OF MIGRAINE- MAY REPEAT ONCE IN 2 HOURS. LIMIT2/24 HOURS. AVOID DAILY USE. 9 tablet 6  . topiramate (TOPAMAX) 200 MG tablet TAKE 1 TABLET TWICE DAILY. 60 tablet 11  . verapamil (CALAN-SR) 120 MG CR tablet TAKE ONE TABLET AT BEDTIME. 30 tablet 0   Facility-Administered Medications Prior to Visit  Medication Dose Route Frequency Provider Last Rate Last Dose  . 0.9 %  sodium chloride infusion  500 mL Intravenous Continuous Manus Gunning, MD        ROS Review of Systems    Constitutional: Negative for activity change, appetite change, chills, fatigue and unexpected weight change.  HENT: Negative for congestion, mouth sores and sinus pressure.   Eyes: Negative for visual disturbance.  Respiratory: Negative for cough and chest tightness.   Gastrointestinal: Positive for constipation. Negative for abdominal pain and nausea.  Genitourinary: Negative for difficulty urinating, frequency and vaginal pain.  Musculoskeletal: Positive for back pain and neck pain. Negative for gait problem.  Skin: Negative for pallor and rash.  Neurological: Positive for headaches. Negative for dizziness, tremors, weakness and numbness.  Psychiatric/Behavioral: Negative for confusion and sleep disturbance.    Objective:  BP 128/82 (BP Location: Left Arm, Patient Position: Sitting, Cuff Size: Normal)   Pulse 76   Temp 97.6 F (36.4 C) (Oral)   Ht 5\' 4"  (1.626 m)   Wt 171 lb (77.6 kg)   SpO2 99%   BMI 29.35 kg/m   BP Readings from Last 3 Encounters:  02/26/17 128/82  02/19/17 125/75  11/28/16 138/80    Wt Readings from Last 3 Encounters:  02/26/17 171 lb (77.6 kg)  02/19/17 171 lb 3.2 oz (77.7 kg)  11/28/16 166 lb (75.3 kg)    Physical Exam  Constitutional: She appears well-developed. No distress.  HENT:  Head: Normocephalic.  Right Ear: External ear normal.  Left Ear: External ear normal.  Nose: Nose normal.  Mouth/Throat: Oropharynx is clear and moist.  Eyes: Conjunctivae are normal. Pupils are equal, round, and reactive to light. Right eye exhibits no discharge. Left eye exhibits no discharge.  Neck: Normal range of motion. Neck supple. No JVD present. No tracheal deviation present. No thyromegaly present.  Cardiovascular: Normal rate, regular rhythm and normal heart sounds.   Pulmonary/Chest: No stridor. No respiratory distress. She has no wheezes.  Abdominal: Soft. Bowel sounds are normal. She exhibits no distension and no mass. There is no tenderness. There is  no rebound and no guarding.  Musculoskeletal: She exhibits tenderness. She exhibits no edema.  Lymphadenopathy:    She has no cervical adenopathy.  Neurological: She displays normal reflexes. No cranial nerve deficit. She exhibits normal muscle tone. Coordination normal.  Skin: No rash noted. No erythema.  Psychiatric: She has a normal mood and affect. Her behavior is normal. Judgment and thought content normal.  enlarged tonsills  Lab Results  Component Value Date   WBC 9.2 07/25/2016   HGB 10.7 (L) 07/25/2016   HCT 32.0 (L) 07/25/2016   PLT 186 07/25/2016   GLUCOSE 79 07/14/2016   CHOL 213 (H) 12/21/2013   TRIG 67.0 12/21/2013   HDL 67.80 12/21/2013   LDLDIRECT 130.2 12/21/2013   ALT 26 07/14/2016   AST 29 07/14/2016   NA 138 07/14/2016   K 3.6 07/14/2016   CL 110 07/14/2016   CREATININE 0.88 07/14/2016   BUN 12 07/14/2016   CO2 24 07/14/2016   TSH 1.633 11/13/2015   HGBA1C 5.3 02/25/2012    Mm Screening Breast Tomo Bilateral  Result Date: 02/19/2017 CLINICAL DATA:  Screening. EXAM: 2D DIGITAL SCREENING BILATERAL MAMMOGRAM WITH CAD AND ADJUNCT TOMO COMPARISON:  Previous exam(s). ACR Breast Density Category c: The breast tissue is heterogeneously dense, which may obscure small masses. FINDINGS: There are no findings suspicious for malignancy. Images were processed with CAD. IMPRESSION: No mammographic evidence of malignancy. A result letter of this screening mammogram will be mailed directly to the patient. RECOMMENDATION: Screening mammogram in one year. (Code:SM-B-01Y) BI-RADS CATEGORY  1: Negative. Electronically Signed   By: Lajean Manes M.D.   On: 02/19/2017 14:08    Assessment & Plan:   There are no diagnoses linked to this encounter. I am having Ms. Winski maintain her cholecalciferol, Multiple Vitamin (MULTIVITAMIN PO), estradiol, Riboflavin (VITAMIN B-2 PO), topiramate, linaclotide, ondansetron, oxyCODONE, verapamil, ibuprofen, cyclobenzaprine, SUMAtriptan, and  nortriptyline. We will continue to administer sodium chloride.  No orders of the defined types were placed in this encounter.    Follow-up: No Follow-up on file.  Walker Kehr, MD

## 2017-02-26 NOTE — Addendum Note (Signed)
Addended by: Cassandria Anger on: 02/26/2017 09:37 AM   Modules accepted: Orders

## 2017-02-26 NOTE — Assessment & Plan Note (Addendum)
f/u w/Neurology Ibuprofen, Imitrex, Topamax,  Nortriptyline - intolerant

## 2017-02-26 NOTE — Assessment & Plan Note (Signed)
Treat chronic pain

## 2017-03-09 ENCOUNTER — Telehealth: Payer: Self-pay | Admitting: *Deleted

## 2017-03-09 NOTE — Telephone Encounter (Signed)
Pt called stating she is out of her estradiol , needs refill. I explained to patient we don't have her paper chart here from [facility]. I called [facility] and had to leave a message. I asked pt to do the same as well.

## 2017-03-10 ENCOUNTER — Other Ambulatory Visit: Payer: Self-pay

## 2017-03-10 DIAGNOSIS — H2513 Age-related nuclear cataract, bilateral: Secondary | ICD-10-CM | POA: Diagnosis not present

## 2017-03-10 DIAGNOSIS — H43811 Vitreous degeneration, right eye: Secondary | ICD-10-CM | POA: Diagnosis not present

## 2017-03-10 MED ORDER — ESTRADIOL 0.075 MG/24HR TD PTWK: 0.0750 mg | MEDICATED_PATCH | TRANSDERMAL | 0 refills | Status: DC

## 2017-03-10 NOTE — Telephone Encounter (Signed)
Rx refill request received but Rx has already been sent.

## 2017-03-10 NOTE — Telephone Encounter (Signed)
Patient paper chart arrived, pt has annual scheduled on 03/12/17, needs refill on climara patch 0.075 mg # 4 patch sent

## 2017-03-12 ENCOUNTER — Ambulatory Visit (INDEPENDENT_AMBULATORY_CARE_PROVIDER_SITE_OTHER): Payer: BLUE CROSS/BLUE SHIELD | Admitting: Obstetrics & Gynecology

## 2017-03-12 ENCOUNTER — Encounter: Payer: Self-pay | Admitting: Obstetrics & Gynecology

## 2017-03-12 VITALS — BP 132/84 | Ht 63.5 in | Wt 172.0 lb

## 2017-03-12 DIAGNOSIS — N393 Stress incontinence (female) (male): Secondary | ICD-10-CM

## 2017-03-12 DIAGNOSIS — Z01419 Encounter for gynecological examination (general) (routine) without abnormal findings: Secondary | ICD-10-CM

## 2017-03-12 DIAGNOSIS — Z79890 Hormone replacement therapy (postmenopausal): Secondary | ICD-10-CM

## 2017-03-12 MED ORDER — ESTRADIOL 0.05 MG/24HR TD PTWK: 0.0500 mg | MEDICATED_PATCH | TRANSDERMAL | 4 refills | Status: DC

## 2017-03-12 NOTE — Progress Notes (Signed)
Beth Huffman [birth year] [id number]   History:    56 y.o. G2P2 Married  RP:  Established patient presenting for annual gyn exam   HPI:  Menopause.  On HRT x about 7 yrs, Estradiol Patch 0.075 currently.  No Menopausal Sx.  S/P Vaginal Hyst in 90's and Sacrocolpopexy/BSO/Ant Repair/Perineoplasty/TVT 06/2016.  Rare mild SUI.  No pelvic pain.  Feels that suspension is very good.  Breasts wnl.    Past medical history,surgical history, family history and social history were all reviewed and documented in the EPIC chart.  Gynecologic History No LMP recorded. Patient is postmenopausal. Contraception: status post hysterectomy Last Pap: 11/2015. Results were: normal Last mammogram: 01/2017. Results were: normal  Obstetric History OB History  Gravida Para Term Preterm AB Living  2 2       2   SAB TAB Ectopic Multiple Live Births               # Outcome Date GA Lbr Len/2nd Weight Sex Delivery Anes PTL Lv  2 Para           1 Para                ROS: A ROS was performed and pertinent positives and negatives are included in the history.  GENERAL: No fevers or chills. HEENT: No change in vision, no earache, sore throat or sinus congestion. NECK: No pain or stiffness. CARDIOVASCULAR: No chest pain or pressure. No palpitations. PULMONARY: No shortness of breath, cough or wheeze. GASTROINTESTINAL: No abdominal pain, nausea, vomiting or diarrhea, melena or bright red blood per rectum. GENITOURINARY: No urinary frequency, urgency, hesitancy or dysuria. MUSCULOSKELETAL: No joint or muscle pain, no back pain, no recent trauma. DERMATOLOGIC: No rash, no itching, no lesions. ENDOCRINE: No polyuria, polydipsia, no heat or cold intolerance. No recent change in weight. HEMATOLOGICAL: No anemia or easy bruising or bleeding. NEUROLOGIC: No headache, seizures, numbness, tingling or weakness. PSYCHIATRIC: No depression, no loss of interest in normal activity or change in sleep pattern.     Exam:   BP  132/84   Ht 5' 3.5" (1.613 m)   Wt 172 lb (78 kg)   BMI 29.99 kg/m   Body mass index is 29.99 kg/m.  General appearance : Well developed well nourished female. No acute distress HEENT: Eyes: no retinal hemorrhage or exudates,  Neck supple, trachea midline, no carotid bruits, no thyroidmegaly Lungs: Clear to auscultation, no rhonchi or wheezes, or rib retractions  Heart: Regular rate and rhythm, no murmurs or gallops Breast:Examined in sitting and supine position were symmetrical in appearance, no palpable masses or tenderness,  no skin retraction, no nipple inversion, no nipple discharge, no skin discoloration, no axillary or supraclavicular lymphadenopathy Abdomen: no palpable masses or tenderness, no rebound or guarding Extremities: no edema or skin discoloration or tenderness  Pelvic:  Bartholin, Urethra, Skene Glands: Within normal limits             Vagina: No gross lesions or discharge.  Very good suspension of Vaginal vault, no Cystorectocele.  Pap done.  Uterus/Cervix Absent  Adnexa  Without masses or tenderness  Anus and perineum  normal    Assessment/Plan:  56 y.o. female for annual exam   1. Encounter for routine gynecological examination with Papanicolaou smear of cervix Gyn exam s/p Hyst and Sacrocolpopexy/BSO/Ant Repair/Perineoplasty/TVT.  Very good suspension of Vaginal vault.  No CystoRectocele.  Pap reflex done.  2. Post-menopause on HRT (hormone replacement therapy) S/P Hyst.  Decision to continue  with Estradiol patch but to wean progressively and d/c at 10 yrs, in 3 yrs.  Estradiol 0.05 prescribed.  Schedule BD.  3. Mild SUI Kegels PRN  Princess Bruins MD, 9:46 AM 03/12/2017

## 2017-03-12 NOTE — Patient Instructions (Signed)
1. Encounter for routine gynecological examination with Papanicolaou smear of cervix Gyn exam s/p Hyst and Sacrocolpopexy/BSO/Ant Repair/Perineoplasty/TVT.  Very good suspension of Vaginal vault.  No CystoRectocele.  Pap reflex done.  2. Post-menopause on HRT (hormone replacement therapy) S/P Hyst.  Decision to continue with Estradiol patch but to wean progressively and d/c at 10 yrs, in 3 yrs.  Estradiol 0.05 prescribed.  Schedule BD.  3. Mild SUI Kegels PRN  Beth Huffman, it was a pleasure to see you today!  I will inform you of your results as soon as available.

## 2017-03-12 NOTE — Addendum Note (Signed)
Addended by: Thurnell Garbe A on: 03/12/2017 11:05 AM   Modules accepted: Orders

## 2017-03-13 LAB — PAP IG W/ RFLX HPV ASCU

## 2017-03-25 ENCOUNTER — Other Ambulatory Visit: Payer: Self-pay | Admitting: Gynecology

## 2017-03-25 DIAGNOSIS — Z780 Asymptomatic menopausal state: Secondary | ICD-10-CM

## 2017-03-25 DIAGNOSIS — Z13820 Encounter for screening for osteoporosis: Secondary | ICD-10-CM

## 2017-04-08 ENCOUNTER — Ambulatory Visit (INDEPENDENT_AMBULATORY_CARE_PROVIDER_SITE_OTHER): Payer: BLUE CROSS/BLUE SHIELD | Admitting: Internal Medicine

## 2017-04-08 VITALS — BP 116/78 | HR 81 | Ht 65.0 in | Wt 169.0 lb

## 2017-04-08 DIAGNOSIS — I471 Supraventricular tachycardia: Secondary | ICD-10-CM

## 2017-04-08 DIAGNOSIS — I10 Essential (primary) hypertension: Secondary | ICD-10-CM

## 2017-04-08 NOTE — Progress Notes (Signed)
Patient Care Team: Plotnikov, Evie Lacks, MD as PCP - General Princess Bruins, MD as Consulting Physician (Obstetrics and Gynecology) Dohmeier, Asencion Partridge, MD as Consulting Physician (Neurology)   HPI  Beth Huffman is a 56 y.o. female Seen in follow-up for symptomatic nonsustained atrial tachycardia  she also has hypertension.   She's had a single episode of tachycardia Stress with family    Records and Results Reviewed   Past Medical History:  Diagnosis Date  . Abnormal weight gain   . GERD (gastroesophageal reflux disease)   . Hypertension   . LBP (low back pain)   . Migraines   . Pyelonephritis 2008  . Stress 2009  . Syringobulbia ([place])   . Syringomyelia ([place])   . Tachycardia   . UTI (lower urinary tract infection)     Past Surgical History:  Procedure Laterality Date  . ABDOMINAL HYSTERECTOMY    . ANTERIOR AND POSTERIOR REPAIR N/A 07/24/2016   Procedure: Possible ANTERIOR (CYSTOCELE) repair, perineoplasty;  Surgeon: Princess Bruins, MD;  Location: [facility];  Service: Gynecology;  Laterality: N/A;  . BLADDER SUSPENSION N/A 07/24/2016   Procedure: TRANSVAGINAL TAPE (TVT) PROCEDURE;  Surgeon: Princess Bruins, MD;  Location: [facility];  Service: Gynecology;  Laterality: N/A;  . COLONOSCOPY    . CYSTOSCOPY N/A 07/24/2016   Procedure: CYSTOSCOPY;  Surgeon: Princess Bruins, MD;  Location: [facility];  Service: Gynecology;  Laterality: N/A;  . intracranial decompression surgery  2006  . ROBOTIC ASSISTED LAPAROSCOPIC SACROCOLPOPEXY N/A 07/24/2016   Procedure: ROBOTIC ASSISTED LAPAROSCOPIC SACROCOLPOPEXY WITH PERINEOPLASTY;  Surgeon: Princess Bruins, MD;  Location: [facility];  Service: Gynecology;  Laterality: N/A;  . TUBAL LIGATION      Current Outpatient Prescriptions  Medication Sig Dispense Refill  . cholecalciferol (VITAMIN D) 1000 UNITS tablet Take 1,000 Units by mouth daily.      . cyclobenzaprine (FLEXERIL) 5 MG tablet TAKE 1 TABLET THREE TIMES DAILY AS NEEDED  FOR MUSCLE SPASMS. 90 tablet 0  . estradiol (CLIMARA - DOSED IN MG/24 HR) 0.05 mg/24hr patch Place 1 patch (0.05 mg total) onto the skin once a week. 12 patch 4  . ibuprofen (ADVIL,MOTRIN) 600 MG tablet Take 1 tablet (600 mg total) by mouth 3 (three) times daily. 90 tablet 2  . linaclotide (LINZESS) 72 MCG capsule Take 1 capsule (72 mcg total) by mouth daily before breakfast. 90 capsule 3  . Multiple Vitamin (MULTIVITAMIN PO) Take 1 tablet by mouth daily.      . nortriptyline (PAMELOR) 10 MG capsule Take 1 capsule (10 mg total) by mouth at bedtime. 30 capsule 6  . ondansetron (ZOFRAN) 8 MG tablet TAKE 1 TABLET EVERY 12 HOURS IF NEEDED FOR NAUSEA. 20 tablet 0  . oxyCODONE (ROXICODONE) 15 MG immediate release tablet Take 1 tablet (15 mg total) by mouth every 6 (six) hours as needed for pain. 120 tablet 0  . Riboflavin (VITAMIN B-2 PO) Take 400 mg by mouth 2 (two) times daily.    . SUMAtriptan (IMITREX) 100 MG tablet TAKE 1 TABLET AT ONSET OF MIGRAINE- MAY REPEAT ONCE IN 2 HOURS. LIMIT2/24 HOURS. AVOID DAILY USE. 9 tablet 6  . topiramate (TOPAMAX) 200 MG tablet TAKE 1 TABLET TWICE DAILY. 60 tablet 11  . verapamil (CALAN-SR) 120 MG CR tablet Take 1 tablet (120 mg total) by mouth at bedtime. 30 tablet 0   Current Facility-Administered Medications  Medication Dose Route Frequency Provider Last Rate Last Dose  . 0.9 %  sodium chloride infusion  500 mL Intravenous Continuous Armbruster, Renelda Loma, MD        Allergies  Allergen Reactions  . Atenolol     Wt gain  . Cefuroxime Axetil     REACTION: nausea - but tolerates PCN  . Codeine Sulfate Nausea And Vomiting and Nausea Only  . Fentanyl Nausea And Vomiting    REACTION: bad reaction  . Nitrofurantoin     REACTION: HA  . Pregabalin     REACTION: cp  . Rapaflo [Silodosin]     Orthostatic BP drop, near syncope   . Tramadol Hcl Nausea And Vomiting    REACTION: sick      Review of Systems negative except from HPI and PMH  Physical  Exam BP 116/78   Pulse 81   Ht 5\' 5"  (1.651 m)   Wt 169 lb (76.7 kg)   SpO2 97%   BMI 28.12 kg/m  Well developed and nourished in no acute distress HENT normal Neck supple with JVP-flat Carotids brisk and full without bruits Clear Regular rate and rhythm, no murmurs or gallops Abd-soft with active BS without hepatomegaly No Clubbing cyanosis edema Skin-warm and dry A & Oriented  Grossly normal sensory and motor function   ECG sinus @ 69 14/10/39  Assessment and  Plan  Hypertension   Atrial tachycardia    BP well controlled As is tachycardia Constipation a chronic problem and related to multiple meds

## 2017-04-08 NOTE — Patient Instructions (Signed)
Medication Instructions: - Your physician recommends that you continue on your current medications as directed. Please refer to the Current Medication list given to you today.  Labwork: - none ordered  Procedures/Testing: - none ordered  Follow-Up: - Your physician wants you to follow-up in: 1 year with Chanetta Marshall, NP.  You will receive a reminder letter in the mail two months in advance. If you don't receive a letter, please call our office to schedule the follow-up appointment.   Any Additional Special Instructions Will Be Listed Below (If Applicable).     If you need a refill on your cardiac medications before your next appointment, please call your pharmacy.  s

## 2017-04-23 ENCOUNTER — Ambulatory Visit: Payer: BLUE CROSS/BLUE SHIELD | Admitting: Internal Medicine

## 2017-04-30 ENCOUNTER — Other Ambulatory Visit: Payer: Self-pay | Admitting: Internal Medicine

## 2017-05-04 ENCOUNTER — Encounter: Payer: Self-pay | Admitting: Family Medicine

## 2017-05-04 ENCOUNTER — Other Ambulatory Visit (INDEPENDENT_AMBULATORY_CARE_PROVIDER_SITE_OTHER): Payer: BLUE CROSS/BLUE SHIELD

## 2017-05-04 ENCOUNTER — Ambulatory Visit (INDEPENDENT_AMBULATORY_CARE_PROVIDER_SITE_OTHER): Payer: BLUE CROSS/BLUE SHIELD | Admitting: Family Medicine

## 2017-05-04 ENCOUNTER — Ambulatory Visit (INDEPENDENT_AMBULATORY_CARE_PROVIDER_SITE_OTHER)
Admission: RE | Admit: 2017-05-04 | Discharge: 2017-05-04 | Disposition: A | Discharge status: Home / Self Care | Payer: BLUE CROSS/BLUE SHIELD | Source: Ambulatory Visit | Attending: Family Medicine | Admitting: Family Medicine

## 2017-05-04 ENCOUNTER — Other Ambulatory Visit: Payer: Self-pay | Admitting: Internal Medicine

## 2017-05-04 VITALS — BP 134/78 | HR 91 | Temp 98.3°F | Resp 12 | Ht 65.0 in | Wt 171.0 lb

## 2017-05-04 DIAGNOSIS — Z0000 Encounter for general adult medical examination without abnormal findings: Secondary | ICD-10-CM | POA: Diagnosis not present

## 2017-05-04 DIAGNOSIS — G950 Syringomyelia and syringobulbia: Secondary | ICD-10-CM

## 2017-05-04 DIAGNOSIS — M25562 Pain in left knee: Secondary | ICD-10-CM

## 2017-05-04 DIAGNOSIS — M25462 Effusion, left knee: Secondary | ICD-10-CM | POA: Diagnosis not present

## 2017-05-04 LAB — LIPID PANEL
CHOLESTEROL: 189 mg/dL (ref 0–200)
HDL: 52.4 mg/dL (ref >39.00)
LDL CALC: 111 mg/dL — AB (ref 0–99)
NONHDL: 136.63
Total CHOL/HDL Ratio: 4
Triglycerides: 129 mg/dL (ref 0.0–149.0)
VLDL: 25.8 mg/dL (ref 0.0–40.0)

## 2017-05-04 LAB — URINALYSIS
Bilirubin Urine: NEGATIVE
HGB URINE DIPSTICK: NEGATIVE
KETONES UR: NEGATIVE
Leukocytes, UA: NEGATIVE
NITRITE: NEGATIVE
Specific Gravity, Urine: 1.01 (ref 1.000–1.030)
TOTAL PROTEIN, URINE-UPE24: NEGATIVE
Urine Glucose: NEGATIVE
Urobilinogen, UA: 0.2 (ref 0.0–1.0)
pH: 6 (ref 5.0–8.0)

## 2017-05-04 LAB — CBC WITH DIFFERENTIAL/PLATELET
BASOS ABS: 0.1 10*3/uL (ref 0.0–0.1)
Basophils Relative: 0.9 % (ref 0.0–3.0)
EOS ABS: 0.3 10*3/uL (ref 0.0–0.7)
Eosinophils Relative: 5.5 % — ABNORMAL HIGH (ref 0.0–5.0)
HEMATOCRIT: 35.7 % — AB (ref 36.0–46.0)
HEMOGLOBIN: 12.1 g/dL (ref 12.0–15.0)
LYMPHS PCT: 40.4 % (ref 12.0–46.0)
Lymphs Abs: 2.5 10*3/uL (ref 0.7–4.0)
MCHC: 34 g/dL (ref 30.0–36.0)
MCV: 92.8 fl (ref 78.0–100.0)
Monocytes Absolute: 0.3 10*3/uL (ref 0.1–1.0)
Monocytes Relative: 4.2 % (ref 3.0–12.0)
NEUTROS ABS: 3 10*3/uL (ref 1.4–7.7)
Neutrophils Relative %: 49 % (ref 43.0–77.0)
PLATELETS: 216 10*3/uL (ref 150.0–400.0)
RBC: 3.85 Mil/uL — ABNORMAL LOW (ref 3.87–5.11)
RDW: 12.8 % (ref 11.5–15.5)
WBC: 6.1 10*3/uL (ref 4.0–10.5)

## 2017-05-04 LAB — BASIC METABOLIC PANEL
BUN: 16 mg/dL (ref 6–23)
CHLORIDE: 108 meq/L (ref 96–112)
CO2: 26 mEq/L (ref 19–32)
Calcium: 9.8 mg/dL (ref 8.4–10.5)
Creatinine, Ser: 0.94 mg/dL (ref 0.40–1.20)
GFR: 65.2 mL/min (ref >60.00)
Glucose, Bld: 81 mg/dL (ref 70–99)
POTASSIUM: 3.7 meq/L (ref 3.5–5.1)
Sodium: 140 mEq/L (ref 135–145)

## 2017-05-04 LAB — HEPATIC FUNCTION PANEL
ALK PHOS: 64 U/L (ref 39–117)
ALT: 21 U/L (ref 0–35)
AST: 20 U/L (ref 0–37)
Albumin: 4 g/dL (ref 3.5–5.2)
BILIRUBIN TOTAL: 0.3 mg/dL (ref 0.2–1.2)
Bilirubin, Direct: 0 mg/dL (ref 0.0–0.3)
Total Protein: 6.4 g/dL (ref 6.0–8.3)

## 2017-05-04 LAB — TSH: TSH: 1.97 u[IU]/mL (ref 0.35–4.50)

## 2017-05-04 LAB — VITAMIN D 25 HYDROXY (VIT D DEFICIENCY, FRACTURES): VITD: 43.56 ng/mL (ref 30.00–100.00)

## 2017-05-04 LAB — VITAMIN B12: Vitamin B-12: 788 pg/mL (ref 211–911)

## 2017-05-04 NOTE — Patient Instructions (Signed)

## 2017-05-04 NOTE — Telephone Encounter (Signed)
Routing to dr plotnikov, please advise thanks 

## 2017-05-04 NOTE — Progress Notes (Signed)
Subjective:    Patient ID: Beth Huffman, female    DOB: May 27, 1961, 56 y.o.   MRN: [id number]  HPI This is a 56 yo female who presents with left knee pain for ? Several weeks. Can not recall any injury related to pain. Pain is shooting and sharp, comes and goes, interferes with sleep. Has been wearing a support hose and a brace which helps with swelling and pain a little. Very painful if she touches it. Has tried an over the counter arthritis cream with some temporary relief.  Has chronic badk pain and syringomyelia and sees Dr. Alain Marion frequently. Is maintained on oxycodone, flexeril and ibuprofen. Is concerned by the amount of ibuprofen she has been taking- 900 mg 3x-4 times a day.   Past Medical History:  Diagnosis Date  . Abnormal weight gain   . GERD (gastroesophageal reflux disease)   . Hypertension   . LBP (low back pain)   . Migraines   . Pyelonephritis 2008  . Stress 2009  . Syringobulbia ([place])   . Syringomyelia ([place])   . Tachycardia   . UTI (lower urinary tract infection)    Past Surgical History:  Procedure Laterality Date  . ABDOMINAL HYSTERECTOMY    . ANTERIOR AND POSTERIOR REPAIR N/A 07/24/2016   Procedure: Possible ANTERIOR (CYSTOCELE) repair, perineoplasty;  Surgeon: Princess Bruins, MD;  Location: [facility];  Service: Gynecology;  Laterality: N/A;  . BLADDER SUSPENSION N/A 07/24/2016   Procedure: TRANSVAGINAL TAPE (TVT) PROCEDURE;  Surgeon: Princess Bruins, MD;  Location: [facility];  Service: Gynecology;  Laterality: N/A;  . COLONOSCOPY    . CYSTOSCOPY N/A 07/24/2016   Procedure: CYSTOSCOPY;  Surgeon: Princess Bruins, MD;  Location: [facility];  Service: Gynecology;  Laterality: N/A;  . intracranial decompression surgery  2006  . ROBOTIC ASSISTED LAPAROSCOPIC SACROCOLPOPEXY N/A 07/24/2016   Procedure: ROBOTIC ASSISTED LAPAROSCOPIC SACROCOLPOPEXY WITH PERINEOPLASTY;  Surgeon: Princess Bruins, MD;  Location: [facility];  Service: Gynecology;  Laterality: N/A;  . TUBAL  LIGATION     Family History  Problem Relation Age of Onset  . COPD Mother   . Peripheral vascular disease Mother   . Hypertension Mother   . Heart disease Mother   . Hypertension Father   . Anxiety disorder Other   . Heart attack Maternal Grandmother   . Stroke Maternal Grandfather   . Stroke Paternal Grandfather   . Colon cancer Maternal Uncle 38  . Breast cancer Maternal Aunt    Social History  Substance Use Topics  . Smoking status: Never Smoker  . Smokeless tobacco: Never Used  . Alcohol use No      Review of Systems Per HPI    Objective:   Physical Exam  Constitutional: She is oriented to person, place, and time. She appears well-developed and well-nourished.  HENT:  Head: Normocephalic and atraumatic.  Cardiovascular: Normal rate.   Pulmonary/Chest: Effort normal.  Musculoskeletal:       Left knee: She exhibits normal range of motion, no swelling, no ecchymosis, normal meniscus and no MCL laxity. Tenderness found. Medial joint line and patellar tendon tenderness noted.  Neurological: She is alert and oriented to person, place, and time.  Skin: Skin is warm and dry.  Psychiatric: She has a normal mood and affect. Her behavior is normal. Judgment and thought content normal.  Vitals reviewed.     BP 134/78 (BP Location: Left Arm, Patient Position: Sitting, Cuff Size: Normal)   Pulse 91   Temp 98.3 F (36.8 C) (Oral)  Resp 12   Ht 5\' 5"  (1.651 m)   Wt 171 lb (77.6 kg)   SpO2 98%   BMI 28.46 kg/m  Wt Readings from Last 3 Encounters:  05/04/17 171 lb (77.6 kg)  04/08/17 169 lb (76.7 kg)  03/12/17 172 lb (78 kg)       Assessment & Plan:  1. Acute pain of left knee - discussed appropriate use of NSAIDs, she has labs ordered that she will have drawn today - continue brace and support hose, can use ice/heat - DG Knee 1-2 Views Left; Future   Clarene Reamer, FNP-BC  [facility]  05/04/2017 1:26 PM

## 2017-05-25 ENCOUNTER — Other Ambulatory Visit: Payer: Self-pay | Admitting: Internal Medicine

## 2017-05-25 NOTE — Telephone Encounter (Signed)
Pt's medication was sent to pt's pharmacy as requested. Confirmation received.  °

## 2017-05-28 ENCOUNTER — Ambulatory Visit (INDEPENDENT_AMBULATORY_CARE_PROVIDER_SITE_OTHER): Payer: BLUE CROSS/BLUE SHIELD | Admitting: Internal Medicine

## 2017-05-28 ENCOUNTER — Encounter: Payer: Self-pay | Admitting: Internal Medicine

## 2017-05-28 DIAGNOSIS — J0101 Acute recurrent maxillary sinusitis: Secondary | ICD-10-CM

## 2017-05-28 DIAGNOSIS — M5442 Lumbago with sciatica, left side: Secondary | ICD-10-CM

## 2017-05-28 DIAGNOSIS — M25562 Pain in left knee: Secondary | ICD-10-CM | POA: Diagnosis not present

## 2017-05-28 DIAGNOSIS — M5441 Lumbago with sciatica, right side: Secondary | ICD-10-CM | POA: Diagnosis not present

## 2017-05-28 DIAGNOSIS — G8929 Other chronic pain: Secondary | ICD-10-CM

## 2017-05-28 MED ORDER — METHYLPREDNISOLONE ACETATE 40 MG/ML IJ SUSP
40.0000 mg | Freq: Once | INTRAMUSCULAR | Status: AC
  Administered 2017-05-28: 40 mg via INTRA_ARTICULAR

## 2017-05-28 MED ORDER — AMOXICILLIN-POT CLAVULANATE 875-125 MG PO TABS: 1.0000 | ORAL_TABLET | Freq: Two times a day (BID) | ORAL | 0 refills | Status: DC

## 2017-05-28 MED ORDER — OXYCODONE HCL 15 MG PO TABS: 15.0000 mg | ORAL_TABLET | Freq: Four times a day (QID) | ORAL | 0 refills | Status: DC | PRN

## 2017-05-28 NOTE — Assessment & Plan Note (Signed)
Augmentin

## 2017-05-28 NOTE — Progress Notes (Signed)
Subjective:  Patient ID: Beth Huffman, female    DOB: September 27, 1961  Age: 56 y.o. MRN: [id number]  CC: No chief complaint on file.   HPI KAYLI BEAL presents for chronic pain, HAs, HTN C/o L knee pain x 3 mo, pain at rest, hard to walk C/o sinusitis x 2 wks - took a Zpac  Outpatient Medications Prior to Visit  Medication Sig Dispense Refill  . cholecalciferol (VITAMIN D) 1000 UNITS tablet Take 1,000 Units by mouth daily.      . cyclobenzaprine (FLEXERIL) 5 MG tablet TAKE 1 TABLET THREE TIMES DAILY AS NEEDED FOR MUSCLE SPASMS. 90 tablet 0  . estradiol (CLIMARA - DOSED IN MG/24 HR) 0.05 mg/24hr patch Place 1 patch (0.05 mg total) onto the skin once a week. 12 patch 4  . IBU 600 MG tablet TAKE 1 TABLET 3 TIMES A DAY. 90 tablet 1  . linaclotide (LINZESS) 72 MCG capsule Take 1 capsule (72 mcg total) by mouth daily before breakfast. 90 capsule 3  . Multiple Vitamin (MULTIVITAMIN PO) Take 1 tablet by mouth daily.      . nortriptyline (PAMELOR) 10 MG capsule Take 1 capsule (10 mg total) by mouth at bedtime. 30 capsule 6  . ondansetron (ZOFRAN) 8 MG tablet TAKE 1 TABLET EVERY 12 HOURS IF NEEDED FOR NAUSEA. 20 tablet 0  . oxyCODONE (ROXICODONE) 15 MG immediate release tablet Take 1 tablet (15 mg total) by mouth every 6 (six) hours as needed for pain. 120 tablet 0  . Riboflavin (VITAMIN B-2 PO) Take 400 mg by mouth 2 (two) times daily.    . SUMAtriptan (IMITREX) 100 MG tablet TAKE 1 TABLET AT ONSET OF MIGRAINE- MAY REPEAT ONCE IN 2 HOURS. LIMIT2/24 HOURS. AVOID DAILY USE. 9 tablet 6  . topiramate (TOPAMAX) 200 MG tablet TAKE 1 TABLET TWICE DAILY. 60 tablet 11  . verapamil (CALAN-SR) 120 MG CR tablet TAKE ONE TABLET AT BEDTIME. 30 tablet 10   Facility-Administered Medications Prior to Visit  Medication Dose Route Frequency Provider Last Rate Last Dose  . 0.9 %  sodium chloride infusion  500 mL Intravenous Continuous Armbruster, Renelda Loma, MD        ROS Review of Systems    Constitutional: Negative for activity change, appetite change, chills, fatigue and unexpected weight change.  HENT: Negative for congestion, mouth sores and sinus pressure.   Eyes: Negative for visual disturbance.  Respiratory: Negative for cough and chest tightness.   Gastrointestinal: Negative for abdominal pain and nausea.  Genitourinary: Negative for difficulty urinating, frequency and vaginal pain.  Musculoskeletal: Positive for arthralgias, back pain, gait problem and neck pain.  Skin: Negative for pallor and rash.  Neurological: Negative for dizziness, tremors, weakness, numbness and headaches.  Psychiatric/Behavioral: Negative for confusion and sleep disturbance.    Objective:  BP 122/84 (BP Location: Left Arm, Patient Position: Sitting, Cuff Size: Normal)   Pulse 88   Temp 98.3 F (36.8 C) (Oral)   Ht 5\' 5"  (1.651 m)   Wt 171 lb (77.6 kg)   SpO2 98%   BMI 28.46 kg/m   BP Readings from Last 3 Encounters:  05/28/17 122/84  05/04/17 134/78  04/08/17 116/78    Wt Readings from Last 3 Encounters:  05/28/17 171 lb (77.6 kg)  05/04/17 171 lb (77.6 kg)  04/08/17 169 lb (76.7 kg)    Physical Exam  Constitutional: She appears well-developed. No distress.  HENT:  Head: Normocephalic.  Right Ear: External ear normal.  Left Ear: External  ear normal.  Nose: Nose normal.  Mouth/Throat: Oropharynx is clear and moist.  Eyes: Pupils are equal, round, and reactive to light. Conjunctivae are normal. Right eye exhibits no discharge. Left eye exhibits no discharge.  Neck: Normal range of motion. Neck supple. No JVD present. No tracheal deviation present. No thyromegaly present.  Cardiovascular: Normal rate, regular rhythm and normal heart sounds.   Pulmonary/Chest: No stridor. No respiratory distress. She has no wheezes.  Abdominal: Soft. Bowel sounds are normal. She exhibits no distension and no mass. There is no tenderness. There is no rebound and no guarding.  Musculoskeletal:  She exhibits tenderness. She exhibits no edema.  Lymphadenopathy:    She has no cervical adenopathy.  Neurological: She displays normal reflexes. No cranial nerve deficit. She exhibits normal muscle tone. Coordination normal.  Skin: No rash noted. No erythema.  Psychiatric: She has a normal mood and affect. Her behavior is normal. Judgment and thought content normal.  L knee b anserina and L knee is tender w/ROM   Procedure Note :     Procedure : Joint Injection, L  knee   Indication:  Joint osteoarthritis with refractory  chronic pain.   Risks including unsuccessful procedure , bleeding, infection, bruising, skin atrophy, "steroid flare-up" and others were explained to the patient in detail as well as the benefits. Informed consent was obtained and signed.   Tthe patient was placed in a comfortable position. Lateral approach was used. Skin was prepped with Betadine and alcohol  and anesthetized a cooling spray. Then, a 5 cc syringe with a 1.5 inch long 25-gauge needle was used for a joint injection.. The needle was advanced  Into the knee joint cavity. I aspirated a small amount of intra-articular fluid to confirm correct placement of the needle and injected the joint with 5 mL of 2% lidocaine and 40 mg of Depo-Medrol .  Band-Aid was applied.   Tolerated well. Complications: None. Good pain relief following the procedure.    Procedure Note :    Procedure :Joint Injection,  Knee bursa anserina L   Indication: Bursitis with refractory  chronic pain.   Risks including unsuccessful procedure , bleeding, infection, bruising, skin atrophy and others were explained to the patient in detail as well as the benefits. Informed consent was obtained and signed.   Tthe patient was placed in a comfortable position. Skin was prepped with Betadine and alcohol  and anesthetized with a cooling spray. Then, a 3 cc syringe with a 1.5 inch long 25-gauge needle was used for a bursa injection in a fan-like  fasion with 3 mL of 2% lidocaine and 40 mg of Depo-Medrol .  Band-Aid was applied.   Tolerated well. Complications: None. Good pain relief following the procedure.      Lab Results  Component Value Date   WBC 6.1 05/04/2017   HGB 12.1 05/04/2017   HCT 35.7 (L) 05/04/2017   PLT 216.0 05/04/2017   GLUCOSE 81 05/04/2017   CHOL 189 05/04/2017   TRIG 129.0 05/04/2017   HDL 52.40 05/04/2017   LDLDIRECT 130.2 12/21/2013   LDLCALC 111 (H) 05/04/2017   ALT 21 05/04/2017   AST 20 05/04/2017   NA 140 05/04/2017   K 3.7 05/04/2017   CL 108 05/04/2017   CREATININE 0.94 05/04/2017   BUN 16 05/04/2017   CO2 26 05/04/2017   TSH 1.97 05/04/2017   HGBA1C 5.3 02/25/2012    Dg Knee 1-2 Views Left  Result Date: 05/04/2017 CLINICAL DATA:  No known  injury. Generalized knee pain x 1 mo. EXAM: LEFT KNEE - 1-2 VIEW COMPARISON:  None. FINDINGS: There is mild narrowing of the articular cartilage in the medial compartment. Negative for fracture or dislocation. Normal mineralization and alignment. There may be a tiny effusion in the suprapatellar bursa. Soft tissue calcification anterior to the proximal tibial shaft. IMPRESSION: 1. Negative for fracture. 2. Mild narrowing of medial compartment cartilage with small effusion. Electronically Signed   By: Lucrezia Europe M.D.   On: 05/04/2017 15:33    Assessment & Plan:   There are no diagnoses linked to this encounter. I am having Ms. Beitler maintain her cholecalciferol, Multiple Vitamin (MULTIVITAMIN PO), Riboflavin (VITAMIN B-2 PO), topiramate, ondansetron, SUMAtriptan, nortriptyline, oxyCODONE, linaclotide, estradiol, cyclobenzaprine, IBU, and verapamil. We will continue to administer sodium chloride.  No orders of the defined types were placed in this encounter.    Follow-up: No Follow-up on file.  Walker Kehr, MD

## 2017-05-28 NOTE — Patient Instructions (Signed)
Postprocedure instructions :    A Band-Aid should be left on for 12 hours. Injection therapy is not a cure itself. It is used in conjunction with other modalities. You can use nonsteroidal anti-inflammatories like ibuprofen , hot and cold compresses. Rest is recommended in the next 24 hours. You need to report immediately  if fever, chills or any signs of infection develop. 

## 2017-05-28 NOTE — Assessment & Plan Note (Signed)
On Oxycodone  Potential benefits of a long term opioids use as well as potential risks (i.e. addiction risk, apnea etc) and complications (i.e. Somnolence, constipation and others) were explained to the patient and were aknowledged.  

## 2017-05-28 NOTE — Assessment & Plan Note (Signed)
L knee pain - L knee b anserina and L knee is tender w/ROM Will inject Reduce Ibuprofen

## 2017-05-31 ENCOUNTER — Other Ambulatory Visit: Payer: Self-pay | Admitting: Internal Medicine

## 2017-07-22 ENCOUNTER — Other Ambulatory Visit: Payer: Self-pay | Admitting: Internal Medicine

## 2017-07-27 ENCOUNTER — Encounter: Payer: Self-pay | Admitting: Internal Medicine

## 2017-07-27 ENCOUNTER — Ambulatory Visit (INDEPENDENT_AMBULATORY_CARE_PROVIDER_SITE_OTHER): Payer: BLUE CROSS/BLUE SHIELD | Admitting: Internal Medicine

## 2017-07-27 VITALS — BP 120/80 | HR 74 | Temp 98.1°F | Ht 65.0 in | Wt 168.0 lb

## 2017-07-27 DIAGNOSIS — Z23 Encounter for immunization: Secondary | ICD-10-CM

## 2017-07-27 DIAGNOSIS — R21 Rash and other nonspecific skin eruption: Secondary | ICD-10-CM | POA: Diagnosis not present

## 2017-07-27 DIAGNOSIS — G950 Syringomyelia and syringobulbia: Secondary | ICD-10-CM | POA: Diagnosis not present

## 2017-07-27 DIAGNOSIS — G441 Vascular headache, not elsewhere classified: Secondary | ICD-10-CM

## 2017-07-27 DIAGNOSIS — R748 Abnormal levels of other serum enzymes: Secondary | ICD-10-CM | POA: Diagnosis not present

## 2017-07-27 DIAGNOSIS — M5441 Lumbago with sciatica, right side: Secondary | ICD-10-CM | POA: Diagnosis not present

## 2017-07-27 DIAGNOSIS — G8929 Other chronic pain: Secondary | ICD-10-CM

## 2017-07-27 DIAGNOSIS — M5442 Lumbago with sciatica, left side: Secondary | ICD-10-CM

## 2017-07-27 HISTORY — DX: Rash and other nonspecific skin eruption: R21

## 2017-07-27 MED ORDER — ONDANSETRON HCL 8 MG PO TABS: ORAL_TABLET | ORAL | 2 refills | Status: DC

## 2017-07-27 MED ORDER — IBUPROFEN 600 MG PO TABS: 600.0000 mg | ORAL_TABLET | Freq: Three times a day (TID) | ORAL | 0 refills | Status: DC

## 2017-07-27 MED ORDER — OXYCODONE HCL 15 MG PO TABS: 15.0000 mg | ORAL_TABLET | Freq: Four times a day (QID) | ORAL | 0 refills | Status: DC | PRN

## 2017-07-27 MED ORDER — TOPIRAMATE 200 MG PO TABS: 200.0000 mg | ORAL_TABLET | Freq: Two times a day (BID) | ORAL | 11 refills | Status: DC

## 2017-07-27 MED ORDER — CYCLOBENZAPRINE HCL 5 MG PO TABS: ORAL_TABLET | ORAL | 0 refills | Status: DC

## 2017-07-27 NOTE — Assessment & Plan Note (Signed)
Topamax 400 mg/d; Imitrex prn; Ibuprofen prn, Zofran prn

## 2017-07-27 NOTE — Assessment & Plan Note (Signed)
Labs

## 2017-07-27 NOTE — Progress Notes (Addendum)
Subjective:  Patient ID: Beth Huffman, female    DOB: 1961/07/09  Age: 56 y.o. MRN: [id number]  CC: No chief complaint on file.   HPI SEBASTIANA WUEST presents for chronic pain, LBP, HAs f/u  C/o rash on shins after wearing hunting boots for several hrs  Outpatient Medications Prior to Visit  Medication Sig Dispense Refill  . amoxicillin-clavulanate (AUGMENTIN) 875-125 MG tablet Take 1 tablet by mouth 2 (two) times daily. 20 tablet 0  . cholecalciferol (VITAMIN D) 1000 UNITS tablet Take 1,000 Units by mouth daily.      . cyclobenzaprine (FLEXERIL) 5 MG tablet TAKE 1 TABLET THREE TIMES DAILY AS NEEDED FOR MUSCLE SPASMS. 90 tablet 0  . estradiol (CLIMARA - DOSED IN MG/24 HR) 0.05 mg/24hr patch Place 1 patch (0.05 mg total) onto the skin once a week. 12 patch 4  . IBU 600 MG tablet TAKE 1 TABLET 3 TIMES A DAY. 90 tablet 0  . linaclotide (LINZESS) 72 MCG capsule Take 1 capsule (72 mcg total) by mouth daily before breakfast. 90 capsule 3  . Multiple Vitamin (MULTIVITAMIN PO) Take 1 tablet by mouth daily.      . nortriptyline (PAMELOR) 10 MG capsule Take 1 capsule (10 mg total) by mouth at bedtime. 30 capsule 6  . ondansetron (ZOFRAN) 8 MG tablet TAKE 1 TABLET EVERY 12 HOURS IF NEEDED FOR NAUSEA. 20 tablet 0  . oxyCODONE (ROXICODONE) 15 MG immediate release tablet Take 1 tablet (15 mg total) by mouth every 6 (six) hours as needed for pain. 120 tablet 0  . Riboflavin (VITAMIN B-2 PO) Take 400 mg by mouth 2 (two) times daily.    . SUMAtriptan (IMITREX) 100 MG tablet TAKE 1 TABLET AT ONSET OF MIGRAINE- MAY REPEAT ONCE IN 2 HOURS. LIMIT2/24 HOURS. AVOID DAILY USE. 9 tablet 6  . topiramate (TOPAMAX) 200 MG tablet TAKE 1 TABLET TWICE DAILY. 60 tablet 11  . verapamil (CALAN-SR) 120 MG CR tablet TAKE ONE TABLET AT BEDTIME. 30 tablet 10   Facility-Administered Medications Prior to Visit  Medication Dose Route Frequency Provider Last Rate Last Dose  . 0.9 %  sodium chloride infusion  500 mL  Intravenous Continuous Armbruster, Carlota Raspberry, MD        ROS Review of Systems  Constitutional: Negative for activity change, appetite change, chills, fatigue and unexpected weight change.  HENT: Negative for congestion, mouth sores and sinus pressure.   Eyes: Negative for visual disturbance.  Respiratory: Negative for cough and chest tightness.   Gastrointestinal: Negative for abdominal pain and nausea.  Genitourinary: Negative for difficulty urinating, frequency and vaginal pain.  Musculoskeletal: Negative for back pain and gait problem.  Skin: Negative for pallor and rash.  Neurological: Negative for dizziness, tremors, weakness, numbness and headaches.  Psychiatric/Behavioral: Negative for confusion and sleep disturbance.    Objective:  BP 120/80 (BP Location: Left Arm, Patient Position: Sitting, Cuff Size: Large)   Pulse 74   Temp 98.1 F (36.7 C) (Oral)   Ht 5\' 5"  (1.651 m)   Wt 168 lb (76.2 kg)   SpO2 99%   BMI 27.96 kg/m   BP Readings from Last 3 Encounters:  07/27/17 120/80  05/28/17 122/84  05/04/17 134/78    Wt Readings from Last 3 Encounters:  07/27/17 168 lb (76.2 kg)  05/28/17 171 lb (77.6 kg)  05/04/17 171 lb (77.6 kg)    Physical Exam  Constitutional: She appears well-developed. No distress.  HENT:  Head: Normocephalic.  Right Ear: External  ear normal.  Left Ear: External ear normal.  Nose: Nose normal.  Mouth/Throat: Oropharynx is clear and moist.  Eyes: Pupils are equal, round, and reactive to light. Conjunctivae are normal. Right eye exhibits no discharge. Left eye exhibits no discharge.  Neck: Normal range of motion. Neck supple. No JVD present. No tracheal deviation present. No thyromegaly present.  Cardiovascular: Normal rate, regular rhythm and normal heart sounds.   Pulmonary/Chest: No stridor. No respiratory distress. She has no wheezes.  Abdominal: Soft. Bowel sounds are normal. She exhibits no distension and no mass. There is no tenderness.  There is no rebound and no guarding.  Musculoskeletal: She exhibits no edema or tenderness.  Lymphadenopathy:    She has no cervical adenopathy.  Neurological: She displays normal reflexes. No cranial nerve deficit. She exhibits normal muscle tone. Coordination normal.  Skin: No rash noted. No erythema.  Psychiatric: She has a normal mood and affect. Her behavior is normal. Judgment and thought content normal.  brownish maculas on B dist shins  Lab Results  Component Value Date   WBC 6.1 05/04/2017   HGB 12.1 05/04/2017   HCT 35.7 (L) 05/04/2017   PLT 216.0 05/04/2017   GLUCOSE 81 05/04/2017   CHOL 189 05/04/2017   TRIG 129.0 05/04/2017   HDL 52.40 05/04/2017   LDLDIRECT 130.2 12/21/2013   LDLCALC 111 (H) 05/04/2017   ALT 21 05/04/2017   AST 20 05/04/2017   NA 140 05/04/2017   K 3.7 05/04/2017   CL 108 05/04/2017   CREATININE 0.94 05/04/2017   BUN 16 05/04/2017   CO2 26 05/04/2017   TSH 1.97 05/04/2017   HGBA1C 5.3 02/25/2012    Dg Knee 1-2 Views Left  Result Date: 05/04/2017 CLINICAL DATA:  No known injury. Generalized knee pain x 1 mo. EXAM: LEFT KNEE - 1-2 VIEW COMPARISON:  None. FINDINGS: There is mild narrowing of the articular cartilage in the medial compartment. Negative for fracture or dislocation. Normal mineralization and alignment. There may be a tiny effusion in the suprapatellar bursa. Soft tissue calcification anterior to the proximal tibial shaft. IMPRESSION: 1. Negative for fracture. 2. Mild narrowing of medial compartment cartilage with small effusion. Electronically Signed   By: Lucrezia Europe M.D.   On: 05/04/2017 15:33    Assessment & Plan:   There are no diagnoses linked to this encounter. I am having Ms. Karpel maintain her cholecalciferol, Multiple Vitamin (MULTIVITAMIN PO), Riboflavin (VITAMIN B-2 PO), topiramate, SUMAtriptan, nortriptyline, linaclotide, estradiol, verapamil, oxyCODONE, amoxicillin-clavulanate, cyclobenzaprine, ondansetron, and IBU. We  will continue to administer sodium chloride.  No orders of the defined types were placed in this encounter.    Follow-up: No Follow-up on file.  Walker Kehr, MD

## 2017-07-27 NOTE — Assessment & Plan Note (Signed)
Due to syringomyelia On Oxycodone  Potential benefits of a long term opioids use as well as potential risks (i.e. addiction risk, apnea etc) and complications (i.e. Somnolence, constipation and others) were explained to the patient and were aknowledged. 

## 2017-07-27 NOTE — Assessment & Plan Note (Signed)
?  etiology Will watch

## 2017-07-27 NOTE — Assessment & Plan Note (Signed)
Oxycodone prn  Potential benefits of a long term opioids use as well as potential risks (i.e. addiction risk, apnea etc) and complications (i.e. Somnolence, constipation and others) were explained to the patient and were aknowledged. 

## 2017-08-11 ENCOUNTER — Encounter: Payer: Self-pay | Admitting: Internal Medicine

## 2017-08-11 ENCOUNTER — Other Ambulatory Visit: Payer: Self-pay | Admitting: Internal Medicine

## 2017-08-11 DIAGNOSIS — N3000 Acute cystitis without hematuria: Secondary | ICD-10-CM

## 2017-08-11 MED ORDER — SULFAMETHOXAZOLE-TRIMETHOPRIM 800-160 MG PO TABS: 1.0000 | ORAL_TABLET | Freq: Two times a day (BID) | ORAL | 0 refills | Status: DC
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
## 2017-08-21 ENCOUNTER — Ambulatory Visit (INDEPENDENT_AMBULATORY_CARE_PROVIDER_SITE_OTHER): Payer: BLUE CROSS/BLUE SHIELD | Admitting: Nurse Practitioner

## 2017-08-21 ENCOUNTER — Telehealth: Payer: Self-pay | Admitting: Neurology

## 2017-08-21 ENCOUNTER — Encounter: Payer: Self-pay | Admitting: Nurse Practitioner

## 2017-08-21 VITALS — BP 124/75 | HR 73 | Ht 65.0 in | Wt 165.0 lb

## 2017-08-21 DIAGNOSIS — G43719 Chronic migraine without aura, intractable, without status migrainosus: Secondary | ICD-10-CM | POA: Diagnosis not present

## 2017-08-21 MED ORDER — NORTRIPTYLINE HCL 10 MG PO CAPS: 10.0000 mg | ORAL_CAPSULE | Freq: Every day | ORAL | 6 refills | Status: DC

## 2017-08-21 MED ORDER — SUMATRIPTAN SUCCINATE 100 MG PO TABS: ORAL_TABLET | ORAL | 6 refills | Status: DC

## 2017-08-21 NOTE — Progress Notes (Signed)
I have reviewed and agreed above plan. 

## 2017-08-21 NOTE — Patient Instructions (Signed)
Continue current preventive medications, Topamax 200 mg twice a day,  Verapamil 120 daily, 1/2 at bedtime Continue Nortriptyline 10mg  at bedtime  Imitrex 100mg  prn  Migraine tracker APP and keep record Will schedule for Botox with Dr. Krista Blue

## 2017-08-21 NOTE — Telephone Encounter (Signed)
Patient saw [name] on Friday 08/21/17. They want to start her on Botox. I did make the patient an appointment for 10/28/17.  I hope this is okay. She did saw if there was a cancellation she could take that as well.

## 2017-08-25 NOTE — Telephone Encounter (Signed)
Noted, thank you

## 2017-08-29 ENCOUNTER — Other Ambulatory Visit: Payer: Self-pay | Admitting: Internal Medicine

## 2017-09-30 ENCOUNTER — Other Ambulatory Visit: Payer: Self-pay | Admitting: Internal Medicine

## 2017-10-01 ENCOUNTER — Telehealth: Payer: Self-pay | Admitting: Internal Medicine

## 2017-10-01 ENCOUNTER — Other Ambulatory Visit: Payer: Self-pay | Admitting: General Practice

## 2017-10-01 MED ORDER — CYCLOBENZAPRINE HCL 5 MG PO TABS: ORAL_TABLET | ORAL | 0 refills | Status: DC

## 2017-10-01 NOTE — Telephone Encounter (Signed)
Copied from [place]. Topic: Quick Communication - Rx Refill/Question >> Oct 01, 2017 11:28 AM Ahmed Prima L wrote: Has the patient contacted their pharmacy? Yes & they faxed it over 2 days ago. REFILL is FLEXERIL 5mg .    (Agent: If no, request that the patient contact the pharmacy for the refill.)   Preferred Pharmacy (with phone number or street name): [facility] [phone]  Agent: Please be advised that RX refills may take up to 3 business days. We ask that you follow-up with your pharmacy.

## 2017-10-05 ENCOUNTER — Other Ambulatory Visit: Payer: Self-pay | Admitting: Internal Medicine

## 2017-10-13 NOTE — Telephone Encounter (Signed)
I called patient and left a VM asking her to call me back.

## 2017-10-15 NOTE — Telephone Encounter (Signed)
I called patient to inform her that her medication should be ready at the pharmacy tomorrow 12/21. I called the pharmacy and asked that it be delivered on the 27th. I made the patient aware that this was the shipment date we are working towards and gave her their phone number. She agreed to call and give consent tomorrow.

## 2017-10-16 NOTE — Telephone Encounter (Signed)
Pt has called back to inform Andee Poles that she did as instructed and called [facility], they do not have the needed prescription.  Pt is asking for a call from [facility] on next day back in office

## 2017-10-28 ENCOUNTER — Ambulatory Visit: Payer: BLUE CROSS/BLUE SHIELD | Admitting: Internal Medicine

## 2017-10-28 ENCOUNTER — Ambulatory Visit: Payer: BLUE CROSS/BLUE SHIELD | Admitting: Neurology

## 2017-10-28 ENCOUNTER — Encounter: Payer: Self-pay | Admitting: Internal Medicine

## 2017-10-28 ENCOUNTER — Encounter: Payer: Self-pay | Admitting: Neurology

## 2017-10-28 ENCOUNTER — Encounter: Payer: Self-pay | Admitting: *Deleted

## 2017-10-28 VITALS — BP 128/73 | HR 78 | Ht 65.0 in | Wt 171.0 lb

## 2017-10-28 DIAGNOSIS — M25562 Pain in left knee: Secondary | ICD-10-CM

## 2017-10-28 DIAGNOSIS — G950 Syringomyelia and syringobulbia: Secondary | ICD-10-CM

## 2017-10-28 DIAGNOSIS — K5903 Drug induced constipation: Secondary | ICD-10-CM

## 2017-10-28 DIAGNOSIS — G43719 Chronic migraine without aura, intractable, without status migrainosus: Secondary | ICD-10-CM | POA: Diagnosis not present

## 2017-10-28 DIAGNOSIS — G43109 Migraine with aura, not intractable, without status migrainosus: Secondary | ICD-10-CM | POA: Insufficient documentation

## 2017-10-28 DIAGNOSIS — G441 Vascular headache, not elsewhere classified: Secondary | ICD-10-CM

## 2017-10-28 HISTORY — DX: Chronic migraine without aura, intractable, without status migrainosus: G43.719

## 2017-10-28 MED ORDER — SUMATRIPTAN SUCCINATE 100 MG PO TABS: ORAL_TABLET | ORAL | 11 refills | Status: DC

## 2017-10-28 MED ORDER — SUMATRIPTAN SUCCINATE 100 MG PO TABS: ORAL_TABLET | ORAL | 6 refills | Status: DC

## 2017-10-28 MED ORDER — NORTRIPTYLINE HCL 10 MG PO CAPS: 20.0000 mg | ORAL_CAPSULE | Freq: Every day | ORAL | 11 refills | Status: DC

## 2017-10-28 MED ORDER — TOPIRAMATE ER 200 MG PO CAP24: 400.0000 mg | ORAL_CAPSULE | Freq: Every day | ORAL | 11 refills | Status: DC

## 2017-10-28 MED ORDER — OXYCODONE HCL 10 MG PO TABS: 10.0000 mg | ORAL_TABLET | Freq: Four times a day (QID) | ORAL | 0 refills | Status: DC | PRN

## 2017-10-28 MED ORDER — IBUPROFEN 600 MG PO TABS: 600.0000 mg | ORAL_TABLET | Freq: Three times a day (TID) | ORAL | 1 refills | Status: DC

## 2017-10-28 MED ORDER — CYCLOBENZAPRINE HCL 5 MG PO TABS: ORAL_TABLET | ORAL | 0 refills | Status: DC

## 2017-10-28 MED ORDER — ONDANSETRON HCL 8 MG PO TABS: ORAL_TABLET | ORAL | 11 refills | Status: DC

## 2017-10-28 NOTE — Progress Notes (Signed)
**  Botox 100 units x 2 vials, NDC 0301-3143-88, Lot [id number], Exp 03/2020, office supply.//mck,rn**

## 2017-10-28 NOTE — Telephone Encounter (Signed)
I called the pharmacy who apparently lost the prescription for the patients Botox, I gave the pharmacy the confirmation date of 12/17 when they prescription was sent over but they had no record of this. They also had no record of me or the patient calling. I called the patient and informed her that she could come in and we could B/B the medication under her medical benefits and she agreed to that. I verified her insurance.

## 2017-10-28 NOTE — Assessment & Plan Note (Addendum)
L knee pain is worse - patellofemoral syndrome vs other Ortho ref Dr Erlinda Hong

## 2017-10-28 NOTE — Progress Notes (Signed)
Subjective:  Patient ID: Beth Huffman, female    DOB: [birth year]  Age: 57 y.o. MRN: [id number]  CC: No chief complaint on file.   HPI Beth Huffman presents for chronic pain, worsened L knee pain, HAs f/u  Outpatient Medications Prior to Visit  Medication Sig Dispense Refill  . cholecalciferol (VITAMIN D) 1000 UNITS tablet Take 1,000 Units by mouth daily.      . cyclobenzaprine (FLEXERIL) 5 MG tablet TAKE 1 TABLET THREE TIMES DAILY AS NEEDED FOR MUSCLE SPASMS. 90 tablet 0  . estradiol (CLIMARA - DOSED IN MG/24 HR) 0.05 mg/24hr patch Place 1 patch (0.05 mg total) onto the skin once a week. 12 patch 4  . IBU 600 MG tablet TAKE 1 TABLET BY MOUTH 3 TIMES A DAY. 90 tablet 1  . Multiple Vitamin (MULTIVITAMIN PO) Take 1 tablet by mouth daily.      . nortriptyline (PAMELOR) 10 MG capsule Take 1 capsule (10 mg total) by mouth at bedtime. 30 capsule 6  . ondansetron (ZOFRAN) 8 MG tablet TAKE 1 TABLET EVERY 12 HOURS IF NEEDED FOR NAUSEA. 20 tablet 2  . oxyCODONE (ROXICODONE) 15 MG immediate release tablet Take 1 tablet (15 mg total) by mouth every 6 (six) hours as needed for pain. (Patient taking differently: Take 7.5 mg by mouth every 6 (six) hours as needed for pain. ) 120 tablet 0  . Riboflavin (VITAMIN B-2 PO) Take 400 mg by mouth 2 (two) times daily.    . SUMAtriptan (IMITREX) 100 MG tablet TAKE 1 TABLET AT ONSET OF MIGRAINE- MAY REPEAT ONCE IN 2 HOURS. LIMIT2/24 HOURS. AVOID DAILY USE. 9 tablet 6  . topiramate (TOPAMAX) 200 MG tablet Take 1 tablet (200 mg total) by mouth 2 (two) times daily. 60 tablet 11  . verapamil (CALAN-SR) 120 MG CR tablet TAKE ONE TABLET AT BEDTIME. 30 tablet 10  . amoxicillin-clavulanate (AUGMENTIN) 875-125 MG tablet Take 1 tablet by mouth 2 (two) times daily. 20 tablet 0  . cyclobenzaprine (FLEXERIL) 5 MG tablet TAKE 1 TABLET THREE TIMES DAILY AS NEEDED FOR MUSCLE SPASMS. 90 tablet 0  . ibuprofen (IBU) 600 MG tablet Take 1 tablet (600 mg total) by mouth 3  (three) times daily. 90 tablet 0  . linaclotide (LINZESS) 72 MCG capsule Take 1 capsule (72 mcg total) by mouth daily before breakfast. (Patient not taking: Reported on 10/28/2017) 90 capsule 3   Facility-Administered Medications Prior to Visit  Medication Dose Route Frequency Provider Last Rate Last Dose  . 0.9 %  sodium chloride infusion  500 mL Intravenous Continuous Armbruster, Carlota Raspberry, MD        ROS Review of Systems  Constitutional: Negative for activity change, appetite change, chills, fatigue and unexpected weight change.  HENT: Negative for congestion, mouth sores and sinus pressure.   Eyes: Negative for visual disturbance.  Respiratory: Negative for cough and chest tightness.   Gastrointestinal: Negative for abdominal pain and nausea.  Genitourinary: Negative for difficulty urinating, frequency and vaginal pain.  Musculoskeletal: Positive for arthralgias, back pain and gait problem.  Skin: Negative for pallor and rash.  Neurological: Positive for headaches. Negative for dizziness, tremors, weakness and numbness.  Psychiatric/Behavioral: Negative for confusion and sleep disturbance.    Objective:  BP 126/82 (BP Location: Left Arm, Patient Position: Sitting, Cuff Size: Normal)   Pulse 84   Temp 97.9 F (36.6 C) (Oral)   Ht 5\' 5"  (1.651 m)   Wt 163 lb (73.9 kg)   SpO2 100%  BMI 27.12 kg/m   BP Readings from Last 3 Encounters:  10/28/17 126/82  08/21/17 124/75  07/27/17 120/80    Wt Readings from Last 3 Encounters:  10/28/17 163 lb (73.9 kg)  08/21/17 165 lb (74.8 kg)  07/27/17 168 lb (76.2 kg)    Physical Exam  Constitutional: She appears well-developed. No distress.  HENT:  Head: Normocephalic.  Right Ear: External ear normal.  Left Ear: External ear normal.  Nose: Nose normal.  Mouth/Throat: Oropharynx is clear and moist.  Eyes: Conjunctivae are normal. Pupils are equal, round, and reactive to light. Right eye exhibits no discharge. Left eye exhibits no  discharge.  Neck: Normal range of motion. Neck supple. No JVD present. No tracheal deviation present. No thyromegaly present.  Cardiovascular: Normal rate, regular rhythm and normal heart sounds.  Pulmonary/Chest: No stridor. No respiratory distress. She has no wheezes.  Abdominal: Soft. Bowel sounds are normal. She exhibits no distension and no mass. There is no tenderness. There is no rebound and no guarding.  Musculoskeletal: She exhibits tenderness. She exhibits no edema.  Lymphadenopathy:    She has no cervical adenopathy.  Neurological: She displays normal reflexes. No cranial nerve deficit. She exhibits normal muscle tone. Coordination normal.  Skin: No rash noted. No erythema.  Psychiatric: She has a normal mood and affect. Her behavior is normal. Judgment and thought content normal.  L knee/patella tender w/palp and ROM LS - painful  Lab Results  Component Value Date   WBC 6.1 05/04/2017   HGB 12.1 05/04/2017   HCT 35.7 (L) 05/04/2017   PLT 216.0 05/04/2017   GLUCOSE 81 05/04/2017   CHOL 189 05/04/2017   TRIG 129.0 05/04/2017   HDL 52.40 05/04/2017   LDLDIRECT 130.2 12/21/2013   LDLCALC 111 (H) 05/04/2017   ALT 21 05/04/2017   AST 20 05/04/2017   NA 140 05/04/2017   K 3.7 05/04/2017   CL 108 05/04/2017   CREATININE 0.94 05/04/2017   BUN 16 05/04/2017   CO2 26 05/04/2017   TSH 1.97 05/04/2017   HGBA1C 5.3 02/25/2012    Dg Knee 1-2 Views Left  Result Date: 05/04/2017 CLINICAL DATA:  No known injury. Generalized knee pain x 1 mo. EXAM: LEFT KNEE - 1-2 VIEW COMPARISON:  None. FINDINGS: There is mild narrowing of the articular cartilage in the medial compartment. Negative for fracture or dislocation. Normal mineralization and alignment. There may be a tiny effusion in the suprapatellar bursa. Soft tissue calcification anterior to the proximal tibial shaft. IMPRESSION: 1. Negative for fracture. 2. Mild narrowing of medial compartment cartilage with small effusion.  Electronically Signed   By: Lucrezia Europe M.D.   On: 05/04/2017 15:33    Assessment & Plan:   There are no diagnoses linked to this encounter. I have discontinued Beth Huffman. Beth Huffman's linaclotide, amoxicillin-clavulanate, and ibuprofen. I am also having her maintain her cholecalciferol, Multiple Vitamin (MULTIVITAMIN PO), Riboflavin (VITAMIN B-2 PO), estradiol, verapamil, oxyCODONE, ondansetron, topiramate, SUMAtriptan, nortriptyline, cyclobenzaprine, and IBU. We will continue to administer sodium chloride.  No orders of the defined types were placed in this encounter.    Follow-up: No Follow-up on file.  Walker Kehr, MD

## 2017-10-28 NOTE — Assessment & Plan Note (Signed)
On Oxy po

## 2017-10-28 NOTE — Progress Notes (Signed)
[facility]  PATIENT: Beth Huffman DOB: 01/12/1961  HISTORY OF PRESENT ILLNESS: Beth Huffman is a 57 years old right-handed female, referred by her primary care physician Dr. Lauraine Rinne evaluation of chronic headaches, initial evaluation was on August 21, 2016.  I saw her previously in May 2012 for similar complaints, she presented with chronic headaches since early 1990s, extensive imaging study in the past showed evidence of giant cistern magna, she underwent occipital posterior fossa decompression surgery in [facility] in 2005 with some improvement of her headaches.   I have reviewed MRI report in 1994, from [facility] prior to her posterior fossa decompression surgery, showed a large CSF signal density in the posterior fossa, posterior to the vermis, in the midline. 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 Additional syrinx is present from T3-T12 levels enlarging to maximum diameter at the T4 level measuring 20mm and at the T10 level measuring 46mm. Incidental note made of bilateral perineural cysts at T10 level.  CT cystogram in 2007, following decompression surgery: Again demonstrated is a cystic structure in the posterior fossa. There is a mega cisterna magna present. In addition the is a more focal collection within the posterior fossa. This area completely fills with contrast with no differential filling demonstrated to suggest residual cyst. A small amount of interventricular contrast is demonstrated. There is no hydrocephalus. There is no other intracranial mass or mass-effect. Postoperative changes of a suboccipital craniotomy are present.  She has been followed up by neurosurgeon Dr. Luiz Ochoa.  EMG nerve conduction study in 2007, evidence of mild  bilateral carpal tunnel syndromes.  Since May 2016, she had more severe, frequent headaches, bilateral area severe pounding headaches, lasting for few days, failed to improved by Tylenol, ibuprofen, Zofran, BC powder, repeat dose of Imitrex, eventually presented to urgent care, her headache was improved after receiving Demerol, Phenergan shots, sleeping, She continues to have headaches 2-3 times each week, lasting few hours to 2 days, bilateral retro-orbital area severe pounding headache with associated light noise sensitivity, nauseous,   Over the years, she has tried different preventive medications, currently still taking Topamax 200 mg twice a day, verapamil 120 mg daily, has not tried Inderal, nortriptyline, questionable benefit from limited trial of Botox in the past, Trigger for her headaches are weather change, oversleep, stress, For abortive treatment, she has tried Maxalt, Relpax, Zomig Currently she is taking Imitrex 100 mg 10 tablets each months,  UPDATE July 27th 2016 Her headache has much improved, she has average 4-5 severe headaches each months, Imitrex half to one tablets as needed, plus naproxen 500 mg as needed has been helpful, her insurance does not cover [place]  She is overall happy about current progress, will stay on Topamax 200 mg twice a day, verapamil 120 mg a day, and nortriptyline, she complains mild dizziness, left ear reining recently, no hearing loss  Update October 28, 2017: This is her first Botox injection as migraine prevention, she has average 15 migraine day in a month, Imitrex tablets works well for her migraine headaches, she woke up one morning with headaches again, sometimes her migraine can last for 7 days. She is taking preventive medications Topamax 100 mg twice a day, nortriptyline 10 mg every day, complains of dry mouth, sleepiness  REVIEW OF  SYSTEMS: Full 14 system review of systems performed and notable only for those listed, all others are neg:    Headache, ringing the ears   ALLERGIES: Allergies  Allergen Reactions  . Atenolol     Wt gain  . Cefuroxime Axetil     REACTION: nausea - but tolerates PCN  . Codeine Sulfate Nausea And Vomiting and Nausea Only  . Fentanyl Nausea And Vomiting    REACTION: bad reaction  . Nitrofurantoin     REACTION: HA  . Pregabalin     REACTION: cp  . Rapaflo [Silodosin]     Orthostatic BP drop, near syncope   . Tramadol Hcl Nausea And Vomiting    REACTION: sick    HOME MEDICATIONS: Outpatient Medications Prior to Visit  Medication Sig Dispense Refill  . cholecalciferol (VITAMIN D) 1000 UNITS tablet Take 1,000 Units by mouth daily.      . cyclobenzaprine (FLEXERIL) 5 MG tablet TAKE 1 TABLET THREE TIMES DAILY AS NEEDED FOR MUSCLE SPASMS. 90 tablet 0  . estradiol (CLIMARA - DOSED IN MG/24 HR) 0.05 mg/24hr patch Place 1 patch (0.05 mg total) onto the skin once a week. 12 patch 4  . ibuprofen (IBU) 600 MG tablet Take 1 tablet (600 mg total) by mouth 3 (three) times daily. 90 tablet 1  . Multiple Vitamin (MULTIVITAMIN PO) Take 1 tablet by mouth daily.      . nortriptyline (PAMELOR) 10 MG capsule Take 1 capsule (10 mg total) by mouth at bedtime. 30 capsule 6  . ondansetron (ZOFRAN) 8 MG tablet TAKE 1 TABLET EVERY 12 HOURS IF NEEDED FOR NAUSEA. 20 tablet 2  . Oxycodone HCl 10 MG TABS Take 1 tablet (10 mg total) by mouth 4 (four) times daily as needed. 120 each 0  . Riboflavin (VITAMIN B-2 PO) Take 400 mg by mouth 2 (two) times daily.    . SUMAtriptan (IMITREX) 100 MG tablet TAKE 1 TABLET AT ONSET OF MIGRAINE- MAY REPEAT ONCE IN 2 HOURS. LIMIT2/24 HOURS. AVOID DAILY USE. 12 tablet 6  . topiramate (TOPAMAX) 200 MG tablet Take 1 tablet (200 mg total) by mouth 2 (two) times daily. 60 tablet 11  . verapamil (CALAN-SR) 120 MG CR tablet TAKE ONE TABLET AT BEDTIME. 30 tablet 10  . 0.9 %  sodium chloride infusion      No facility-administered medications prior to visit.     PAST MEDICAL HISTORY: Past  Medical History:  Diagnosis Date  . Abnormal weight gain   . GERD (gastroesophageal reflux disease)   . Hypertension   . LBP (low back pain)   . Migraines   . Pyelonephritis 2008  . Stress 2009  . Syringobulbia ([place])   . Syringomyelia ([place])   . Tachycardia   . UTI (lower urinary tract infection)     PAST SURGICAL HISTORY: Past Surgical History:  Procedure Laterality Date  . ABDOMINAL HYSTERECTOMY    . ANTERIOR AND POSTERIOR REPAIR N/A 07/24/2016   Procedure: Possible ANTERIOR (CYSTOCELE) repair, perineoplasty;  Surgeon: Princess Bruins, MD;  Location: [facility];  Service: Gynecology;  Laterality: N/A;  . BLADDER SUSPENSION N/A 07/24/2016   Procedure: TRANSVAGINAL TAPE (TVT) PROCEDURE;  Surgeon: Princess Bruins, MD;  Location: [facility];  Service: Gynecology;  Laterality: N/A;  . COLONOSCOPY    . CYSTOSCOPY N/A 07/24/2016   Procedure: CYSTOSCOPY;  Surgeon: Princess Bruins, MD;  Location: [facility];  Service: Gynecology;  Laterality: N/A;  . intracranial decompression surgery  2006  . ROBOTIC ASSISTED LAPAROSCOPIC SACROCOLPOPEXY N/A 07/24/2016  Procedure: ROBOTIC ASSISTED LAPAROSCOPIC SACROCOLPOPEXY WITH PERINEOPLASTY;  Surgeon: Princess Bruins, MD;  Location: [facility];  Service: Gynecology;  Laterality: N/A;  . TUBAL LIGATION      FAMILY HISTORY: Family History  Problem Relation Age of Onset  . COPD Mother   . Peripheral vascular disease Mother   . Hypertension Mother   . Heart disease Mother   . Hypertension Father   . Anxiety disorder Other   . Heart attack Maternal Grandmother   . Stroke Maternal Grandfather   . Stroke Paternal Grandfather   . Colon cancer Maternal Uncle 90  . Breast cancer Maternal Aunt     SOCIAL HISTORY: Social History   Socioeconomic History  . Marital status: Married    Spouse name: Not on file  . Number of children: 2  . Years of education: College  . Highest education level: Not on file  Social Needs  . Financial resource strain: Not on  file  . Food insecurity - worry: Not on file  . Food insecurity - inability: Not on file  . Transportation needs - medical: Not on file  . Transportation needs - non-medical: Not on file  Occupational History  . Occupation: Office manager: SELF-EMPLOYED  Tobacco Use  . Smoking status: Never Smoker  . Smokeless tobacco: Never Used  Substance and Sexual Activity  . Alcohol use: No  . Drug use: No  . Sexual activity: Yes  Other Topics Concern  . Not on file  Social History Narrative   Lives at home with her husband.   Right-handed.   1 cup caffeine per day.     PHYSICAL EXAM  Vitals:   10/28/17 1318  Height: 5\' 5"  (1.651 m)   Body mass index is 27.12 kg/m. Generalized: Well developed, Obese female in no acute distress  Head: normocephalic and atraumatic,. Oropharynx benign  Neck: Supple, no carotid bruits  Cardiac: Regular rate rhythm, no murmur  Musculoskeletal: No deformity   Neurological examination   Mentation: Alert oriented to time, place, history taking. Attention span and concentration appropriate. Recent and remote memory intact. Follows all commands speech and language fluent.   Cranial nerve II-XII: Pupils were equal round reactive to light extraocular movements were full, visual field were full on confrontational test. Facial sensation and strength were normal. hearing was intact to finger rubbing bilaterally. Uvula tongue midline. head turning and shoulder shrug were normal and symmetric.Tongue protrusion into cheek strength was normal. Motor: normal bulk and tone, full strength in the BUE, BLE, fine finger movements normal, no pronator drift. No focal weakness Sensory: normal and symmetric to light touch, pinprick, and Vibration, in the  upper and lower extremities Coordination: finger-nose-finger, heel-to-shin bilaterally, no dysmetria Reflexes: Symmetric upper and lower, plantar responses were flexor bilaterally. Gait and Station:  Rising up from seated position without assistance, normal stance, moderate stride, good arm swing, smooth turning, able to perform tiptoe, and heel walking without difficulty. Tandem gait is steady  DIAGNOSTIC DATA (LABS, IMAGING, TESTING) - I reviewed patient records, labs, notes, testing and imaging myself where available.  Lab Results  Component Value Date   WBC 6.1 05/04/2017   HGB 12.1 05/04/2017   HCT 35.7 (L) 05/04/2017   MCV 92.8 05/04/2017   PLT 216.0 05/04/2017      Component Value Date/Time   NA 140 05/04/2017 1333   K 3.7 05/04/2017 1333   CL 108 05/04/2017 1333   CO2 26 05/04/2017 1333   GLUCOSE 81 05/04/2017  1333   BUN 16 05/04/2017 1333   CREATININE 0.94 05/04/2017 1333   CREATININE 0.79 11/13/2015 1520   CALCIUM 9.8 05/04/2017 1333   PROT 6.4 05/04/2017 1333   ALBUMIN 4.0 05/04/2017 1333   AST 20 05/04/2017 1333   ALT 21 05/04/2017 1333   ALKPHOS 64 05/04/2017 1333   BILITOT 0.3 05/04/2017 1333   GFRNONAA >60 07/14/2016 1400   GFRAA >60 07/14/2016 1400    Lab Results  Component Value Date   TSH 1.97 05/04/2017      ASSESSMENT AND PLAN 57 y.o. year old female   Chronic migraine headaches       Patient tolerate the injection well. Will return for repeat injection in 3 months.  Marcial Pacas, M.D. Ph.D.  [facility] [place], [place] [address] Phone: [phone] Fax:      [phone]

## 2017-10-28 NOTE — Assessment & Plan Note (Signed)
Amitiza  

## 2017-10-29 ENCOUNTER — Telehealth: Payer: Self-pay

## 2017-10-29 NOTE — Telephone Encounter (Signed)
I received a prior auth request for trokendi. I have completed and submitted the PA and should have a determination within 48-72 hours.  Key: [id number]

## 2017-10-30 ENCOUNTER — Encounter: Payer: Self-pay | Admitting: Gastroenterology

## 2017-10-30 NOTE — Telephone Encounter (Signed)
Great!  Effective from 10/29/2017 through 10/27/2020.

## 2017-10-30 NOTE — Telephone Encounter (Signed)
Nikki E/[employer] [id number] PA approved 10/29/17 - 10/27/20

## 2017-11-03 ENCOUNTER — Telehealth: Payer: Self-pay | Admitting: Nurse Practitioner

## 2017-11-03 NOTE — Telephone Encounter (Signed)
I called to make them aware we were cancelling the rx.

## 2017-11-03 NOTE — Telephone Encounter (Signed)
Mark from [facility] is asking for a call from [place] re: PA for Botox, please call [phone]

## 2017-11-09 ENCOUNTER — Ambulatory Visit (INDEPENDENT_AMBULATORY_CARE_PROVIDER_SITE_OTHER): Payer: BLUE CROSS/BLUE SHIELD | Admitting: Orthopaedic Surgery

## 2017-11-17 NOTE — Telephone Encounter (Signed)
Mark with [employer] is wanting to speak with Andee Poles re Botox please contact at [phone]

## 2017-11-17 NOTE — Telephone Encounter (Signed)
Please refer to previous phone note. If Elta Guadeloupe calls back again please make him aware that we have cancelled this rx.

## 2018-01-21 DIAGNOSIS — G43719 Chronic migraine without aura, intractable, without status migrainosus: Secondary | ICD-10-CM | POA: Diagnosis not present

## 2018-01-26 ENCOUNTER — Encounter: Payer: Self-pay | Admitting: Internal Medicine

## 2018-01-26 ENCOUNTER — Ambulatory Visit: Payer: BLUE CROSS/BLUE SHIELD | Admitting: Internal Medicine

## 2018-01-26 VITALS — BP 122/84 | HR 74 | Temp 98.0°F | Ht 65.0 in | Wt 165.0 lb

## 2018-01-26 DIAGNOSIS — R748 Abnormal levels of other serum enzymes: Secondary | ICD-10-CM

## 2018-01-26 DIAGNOSIS — G8929 Other chronic pain: Secondary | ICD-10-CM | POA: Diagnosis not present

## 2018-01-26 DIAGNOSIS — K5903 Drug induced constipation: Secondary | ICD-10-CM | POA: Diagnosis not present

## 2018-01-26 DIAGNOSIS — G441 Vascular headache, not elsewhere classified: Secondary | ICD-10-CM | POA: Diagnosis not present

## 2018-01-26 DIAGNOSIS — M5441 Lumbago with sciatica, right side: Secondary | ICD-10-CM | POA: Diagnosis not present

## 2018-01-26 DIAGNOSIS — M5442 Lumbago with sciatica, left side: Secondary | ICD-10-CM

## 2018-01-26 MED ORDER — OXYCODONE HCL 10 MG PO TABS: 10.0000 mg | ORAL_TABLET | Freq: Four times a day (QID) | ORAL | 0 refills | Status: DC | PRN

## 2018-01-26 MED ORDER — ELETRIPTAN HYDROBROMIDE 40 MG PO TABS: 40.0000 mg | ORAL_TABLET | ORAL | 6 refills | Status: DC | PRN

## 2018-01-26 MED ORDER — IBUPROFEN 600 MG PO TABS: 600.0000 mg | ORAL_TABLET | Freq: Three times a day (TID) | ORAL | 1 refills | Status: DC

## 2018-01-26 MED ORDER — CYCLOBENZAPRINE HCL 5 MG PO TABS: ORAL_TABLET | ORAL | 0 refills | Status: DC

## 2018-01-26 NOTE — Assessment & Plan Note (Signed)
Due to syringomyelia On Oxycodone  Potential benefits of a long term opioids use as well as potential risks (i.e. addiction risk, apnea etc) and complications (i.e. Somnolence, constipation and others) were explained to the patient and were aknowledged. 

## 2018-01-26 NOTE — Assessment & Plan Note (Signed)
Topamax

## 2018-01-26 NOTE — Assessment & Plan Note (Signed)
Amitiza  

## 2018-01-26 NOTE — Assessment & Plan Note (Signed)
Labs

## 2018-01-26 NOTE — Progress Notes (Signed)
Subjective:  Patient ID: Beth Huffman, female    DOB: October 16, 1961  Age: 57 y.o. MRN: [id number]  CC: No chief complaint on file.   HPI Beth Huffman presents for LBP, HAs, insomnia f/u  Outpatient Medications Prior to Visit  Medication Sig Dispense Refill  . cholecalciferol (VITAMIN D) 1000 UNITS tablet Take 1,000 Units by mouth daily.      . cyclobenzaprine (FLEXERIL) 5 MG tablet TAKE 1 TABLET THREE TIMES DAILY AS NEEDED FOR MUSCLE SPASMS. 90 tablet 0  . estradiol (CLIMARA - DOSED IN MG/24 HR) 0.05 mg/24hr patch Place 1 patch (0.05 mg total) onto the skin once a week. 12 patch 4  . ibuprofen (IBU) 600 MG tablet Take 1 tablet (600 mg total) by mouth 3 (three) times daily. 90 tablet 1  . Multiple Vitamin (MULTIVITAMIN PO) Take 1 tablet by mouth daily.      . nortriptyline (PAMELOR) 10 MG capsule Take 2 capsules (20 mg total) by mouth at bedtime. 60 capsule 11  . ondansetron (ZOFRAN) 8 MG tablet TAKE 1 TABLET EVERY 12 HOURS IF NEEDED FOR NAUSEA. 20 tablet 11  . Oxycodone HCl 10 MG TABS Take 1 tablet (10 mg total) by mouth 4 (four) times daily as needed. 120 each 0  . Riboflavin (VITAMIN B-2 PO) Take 400 mg by mouth 2 (two) times daily.    . SUMAtriptan (IMITREX) 100 MG tablet TAKE 1 TABLET AT ONSET OF MIGRAINE- MAY REPEAT ONCE IN 2 HOURS. LIMIT2/24 HOURS. AVOID DAILY USE. 12 tablet 11  . Topiramate ER (TROKENDI XR) 200 MG CP24 Take 400 mg by mouth at bedtime. 60 capsule 11  . verapamil (CALAN-SR) 120 MG CR tablet TAKE ONE TABLET AT BEDTIME. 30 tablet 10   No facility-administered medications prior to visit.     ROS Review of Systems  Constitutional: Negative for activity change, appetite change, chills, fatigue and unexpected weight change.  HENT: Negative for congestion, mouth sores and sinus pressure.   Eyes: Negative for visual disturbance.  Respiratory: Negative for cough and chest tightness.   Gastrointestinal: Negative for abdominal pain and nausea.  Genitourinary:  Negative for difficulty urinating, frequency and vaginal pain.  Musculoskeletal: Positive for back pain and neck pain. Negative for gait problem.  Skin: Negative for pallor and rash.  Neurological: Positive for headaches. Negative for dizziness, tremors, weakness and numbness.  Psychiatric/Behavioral: Positive for sleep disturbance. Negative for confusion.    Objective:  BP 122/84 (BP Location: Left Arm, Patient Position: Sitting, Cuff Size: Normal)   Pulse 74   Temp 98 F (36.7 C) (Oral)   Ht 5\' 5"  (1.651 m)   Wt 165 lb (74.8 kg)   SpO2 96%   BMI 27.46 kg/m   BP Readings from Last 3 Encounters:  01/26/18 122/84  10/28/17 128/73  10/28/17 126/82    Wt Readings from Last 3 Encounters:  01/26/18 165 lb (74.8 kg)  10/28/17 171 lb (77.6 kg)  10/28/17 163 lb (73.9 kg)    Physical Exam  Constitutional: She appears well-developed. No distress.  HENT:  Head: Normocephalic.  Right Ear: External ear normal.  Left Ear: External ear normal.  Nose: Nose normal.  Mouth/Throat: Oropharynx is clear and moist.  Eyes: Pupils are equal, round, and reactive to light. Conjunctivae are normal. Right eye exhibits no discharge. Left eye exhibits no discharge.  Neck: Normal range of motion. Neck supple. No JVD present. No tracheal deviation present. No thyromegaly present.  Cardiovascular: Normal rate, regular rhythm and normal heart  sounds.  Pulmonary/Chest: No stridor. No respiratory distress. She has no wheezes.  Abdominal: Soft. Bowel sounds are normal. She exhibits no distension and no mass. There is no tenderness. There is no rebound and no guarding.  Musculoskeletal: She exhibits no edema or tenderness.  Lymphadenopathy:    She has no cervical adenopathy.  Neurological: She displays normal reflexes. No cranial nerve deficit. She exhibits normal muscle tone. Coordination normal.  Skin: No rash noted. No erythema.  Psychiatric: She has a normal mood and affect. Her behavior is normal.  Judgment and thought content normal.  LS tender  Lab Results  Component Value Date   WBC 6.1 05/04/2017   HGB 12.1 05/04/2017   HCT 35.7 (L) 05/04/2017   PLT 216.0 05/04/2017   GLUCOSE 81 05/04/2017   CHOL 189 05/04/2017   TRIG 129.0 05/04/2017   HDL 52.40 05/04/2017   LDLDIRECT 130.2 12/21/2013   LDLCALC 111 (H) 05/04/2017   ALT 21 05/04/2017   AST 20 05/04/2017   NA 140 05/04/2017   K 3.7 05/04/2017   CL 108 05/04/2017   CREATININE 0.94 05/04/2017   BUN 16 05/04/2017   CO2 26 05/04/2017   TSH 1.97 05/04/2017   HGBA1C 5.3 02/25/2012    Dg Knee 1-2 Views Left  Result Date: 05/04/2017 CLINICAL DATA:  No known injury. Generalized knee pain x 1 mo. EXAM: LEFT KNEE - 1-2 VIEW COMPARISON:  None. FINDINGS: There is mild narrowing of the articular cartilage in the medial compartment. Negative for fracture or dislocation. Normal mineralization and alignment. There may be a tiny effusion in the suprapatellar bursa. Soft tissue calcification anterior to the proximal tibial shaft. IMPRESSION: 1. Negative for fracture. 2. Mild narrowing of medial compartment cartilage with small effusion. Electronically Signed   By: Lucrezia Europe M.D.   On: 05/04/2017 15:33    Assessment & Plan:   There are no diagnoses linked to this encounter. I am having Rondall Allegra. Rayos maintain her cholecalciferol, Multiple Vitamin (MULTIVITAMIN PO), Riboflavin (VITAMIN B-2 PO), estradiol, verapamil, cyclobenzaprine, ibuprofen, Oxycodone HCl, Topiramate ER, nortriptyline, SUMAtriptan, and ondansetron.  No orders of the defined types were placed in this encounter.    Follow-up: No follow-ups on file.  Walker Kehr, MD

## 2018-01-27 ENCOUNTER — Encounter: Payer: Self-pay | Admitting: Neurology

## 2018-01-27 ENCOUNTER — Telehealth: Payer: Self-pay | Admitting: Neurology

## 2018-01-27 ENCOUNTER — Ambulatory Visit (INDEPENDENT_AMBULATORY_CARE_PROVIDER_SITE_OTHER): Payer: BLUE CROSS/BLUE SHIELD | Admitting: Neurology

## 2018-01-27 VITALS — BP 126/74 | HR 79 | Ht 65.0 in | Wt 178.0 lb

## 2018-01-27 DIAGNOSIS — G43719 Chronic migraine without aura, intractable, without status migrainosus: Secondary | ICD-10-CM | POA: Diagnosis not present

## 2018-01-27 MED ORDER — TOPIRAMATE ER 200 MG PO CAP24
200.0000 mg | ORAL_CAPSULE | Freq: Every day | ORAL | 4 refills | Status: DC
Start: 2018-01-27 — End: 2018-11-03

## 2018-01-27 MED ORDER — TOPIRAMATE ER 200 MG PO CAP24: 400.0000 mg | ORAL_CAPSULE | Freq: Every day | ORAL | 4 refills | Status: DC

## 2018-01-27 NOTE — Progress Notes (Signed)
[facility]  PATIENT: Beth Huffman DOB: October 03, 1961  HISTORY OF PRESENT ILLNESS: Beth Huffman is a 57 years old right-handed female, referred by her primary care physician Dr. Lauraine Rinne evaluation of chronic headaches, initial evaluation was on August 21, 2016.  I saw her previously in May 2012 for similar complaints, she presented with chronic headaches since early 1990s, extensive imaging study in the past showed evidence of giant cistern magna, she underwent occipital posterior fossa decompression surgery in [facility] in 2005 with some improvement of her headaches.   I have reviewed MRI report in 1994, from [place] system prior to her posterior fossa decompression surgery, showed a large CSF signal density in the posterior fossa, posterior to the vermis, in the midline. 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 Additional syrinx is present from T3-T12 levels enlarging to maximum diameter at the T4 level measuring 74mm and at the T10 level measuring 72mm. Incidental note made of bilateral perineural cysts at T10 level.  CT cystogram in 2007, following decompression surgery: Again demonstrated is a cystic structure in the posterior fossa. There is a mega cisterna magna present. In addition the is a more focal collection within the posterior fossa. This area completely fills with contrast with no differential filling demonstrated to suggest residual cyst. A small amount of interventricular contrast is demonstrated. There is no hydrocephalus. There is no other intracranial mass or mass-effect. Postoperative changes of a suboccipital craniotomy are present.  She has been followed up by neurosurgeon Dr. Luiz Ochoa.  EMG nerve conduction study in 2007, evidence of mild  bilateral carpal tunnel syndromes.  Since May 2016, she had more severe, frequent headaches, bilateral area severe pounding headaches, lasting for few days, failed to improved by Tylenol, ibuprofen, Zofran, BC powder, repeat dose of Imitrex, eventually presented to urgent care, her headache was improved after receiving Demerol, Phenergan shots, sleeping, She continues to have headaches 2-3 times each week, lasting few hours to 2 days, bilateral retro-orbital area severe pounding headache with associated light noise sensitivity, nauseous,   Over the years, she has tried different preventive medications, currently still taking Topamax 200 mg twice a day, verapamil 120 mg daily, has not tried Inderal, nortriptyline, questionable benefit from limited trial of Botox in the past, Trigger for her headaches are weather change, oversleep, stress, For abortive treatment, she has tried Maxalt, Relpax, Zomig Currently she is taking Imitrex 100 mg 10 tablets each months,  UPDATE July 27th 2016 Her headache has much improved, she has average 4-5 severe headaches each months, Imitrex half to one tablets as needed, plus naproxen 500 mg as needed has been helpful, her insurance does not cover [place]  She is overall happy about current progress, will stay on Topamax 200 mg twice a day, verapamil 120 mg a day, and nortriptyline, she complains mild dizziness, left ear reining recently, no hearing loss  Update October 28, 2017: This is her first Botox injection as migraine prevention, she has average 15 migraine day in a month, Imitrex tablets works well for her migraine headaches, she woke up one morning with headaches again, sometimes her migraine can last for 7 days. She is taking preventive medications Topamax 100 mg twice a day, nortriptyline 10 mg every day, complains of dry mouth, sleepiness  UPDATE January 27 2018: She did has mild to moderate response to Botox, the benefit last about 2 months, at the peak of  benefit, she has 1-2 migraine each week, now wearing off, noticed increased headache, 2-3 migraine headaches each week, less optimal response to Maxalt, and the Imitrex, was given a prescription of Relpax, not sure about the response,  She complains of medicine side effect at nighttime, will decrease Trokendi to 200 mg every night  REVIEW OF SYSTEMS: Full 14 system review of systems performed and notable only for those listed, all others are neg:  Headache, ringing the ears   ALLERGIES: Allergies  Allergen Reactions  . Atenolol     Wt gain  . Cefuroxime Axetil     REACTION: nausea - but tolerates PCN  . Codeine Sulfate Nausea And Vomiting and Nausea Only  . Fentanyl Nausea And Vomiting    REACTION: bad reaction  . Nitrofurantoin     REACTION: HA  . Pregabalin     REACTION: cp  . Rapaflo [Silodosin]     Orthostatic BP drop, near syncope   . Tramadol Hcl Nausea And Vomiting    REACTION: sick    HOME MEDICATIONS: Outpatient Medications Prior to Visit  Medication Sig Dispense Refill  . cholecalciferol (VITAMIN D) 1000 UNITS tablet Take 1,000 Units by mouth daily.      . cyclobenzaprine (FLEXERIL) 5 MG tablet TAKE 1 TABLET THREE TIMES DAILY AS NEEDED FOR MUSCLE SPASMS. 90 tablet 0  . eletriptan (RELPAX) 40 MG tablet Take 1 tablet (40 mg total) by mouth as needed for migraine or headache. One tablet by mouth at onset of headache. May repeat in 2 hours if headache persists or recurs. 10 tablet 6  . estradiol (CLIMARA - DOSED IN MG/24 HR) 0.05 mg/24hr patch Place 1 patch (0.05 mg total) onto the skin once a week. 12 patch 4  . ibuprofen (IBU) 600 MG tablet Take 1 tablet (600 mg total) by mouth 3 (three) times daily. 90 tablet 1  . Multiple Vitamin (MULTIVITAMIN PO) Take 1 tablet by mouth daily.      . nortriptyline (PAMELOR) 10 MG capsule Take 2 capsules (20 mg total) by mouth at bedtime. 60 capsule 11  . ondansetron (ZOFRAN) 8 MG tablet TAKE 1 TABLET EVERY 12 HOURS IF NEEDED FOR  NAUSEA. 20 tablet 11  . Oxycodone HCl 10 MG TABS Take 1 tablet (10 mg total) by mouth 4 (four) times daily as needed. 120 each 0  . Riboflavin (VITAMIN B-2 PO) Take 400 mg by mouth 2 (two) times daily.    . Topiramate ER (TROKENDI XR) 200 MG CP24 Take 400 mg by mouth at bedtime. 60 capsule 11  . verapamil (CALAN-SR) 120 MG CR tablet TAKE ONE TABLET AT BEDTIME. 30 tablet 10   No facility-administered medications prior to visit.     PAST MEDICAL HISTORY: Past Medical History:  Diagnosis Date  . Abnormal weight gain   . GERD (gastroesophageal reflux disease)   . Hypertension   . LBP (low back pain)   . Migraines   . Pyelonephritis 2008  . Stress 2009  . Syringobulbia ([place])   . Syringomyelia ([place])   . Tachycardia   . UTI (lower urinary tract infection)     PAST SURGICAL HISTORY: Past Surgical History:  Procedure Laterality Date  . ABDOMINAL HYSTERECTOMY    . ANTERIOR AND POSTERIOR REPAIR N/A 07/24/2016   Procedure: Possible ANTERIOR (CYSTOCELE) repair, perineoplasty;  Surgeon: Princess Bruins, MD;  Location: [facility];  Service: Gynecology;  Laterality: N/A;  . BLADDER SUSPENSION N/A 07/24/2016   Procedure: TRANSVAGINAL TAPE (TVT) PROCEDURE;  Surgeon: Princess Bruins, MD;  Location: [facility];  Service: Gynecology;  Laterality: N/A;  . COLONOSCOPY    . CYSTOSCOPY N/A 07/24/2016   Procedure: CYSTOSCOPY;  Surgeon: Princess Bruins, MD;  Location: [facility];  Service: Gynecology;  Laterality: N/A;  . intracranial decompression surgery  2006  . ROBOTIC ASSISTED LAPAROSCOPIC SACROCOLPOPEXY N/A 07/24/2016   Procedure: ROBOTIC ASSISTED LAPAROSCOPIC SACROCOLPOPEXY WITH PERINEOPLASTY;  Surgeon: Princess Bruins, MD;  Location: [facility];  Service: Gynecology;  Laterality: N/A;  . TUBAL LIGATION      FAMILY HISTORY: Family History  Problem Relation Age of Onset  . COPD Mother   . Peripheral vascular disease Mother   . Hypertension Mother   . Heart disease Mother   . Hypertension Father   .  Anxiety disorder Other   . Heart attack Maternal Grandmother   . Stroke Maternal Grandfather   . Stroke Paternal Grandfather   . Colon cancer Maternal Uncle 63  . Breast cancer Maternal Aunt     SOCIAL HISTORY: Social History   Socioeconomic History  . Marital status: Married    Spouse name: Not on file  . Number of children: 2  . Years of education: College  . Highest education level: Not on file  Occupational History  . Occupation: Office manager: SELF-EMPLOYED  Social Needs  . Financial resource strain: Not on file  . Food insecurity:    Worry: Not on file    Inability: Not on file  . Transportation needs:    Medical: Not on file    Non-medical: Not on file  Tobacco Use  . Smoking status: Never Smoker  . Smokeless tobacco: Never Used  Substance and Sexual Activity  . Alcohol use: No  . Drug use: No  . Sexual activity: Yes  Lifestyle  . Physical activity:    Days per week: Not on file    Minutes per session: Not on file  . Stress: Not on file  Relationships  . Social connections:    Talks on phone: Not on file    Gets together: Not on file    Attends religious service: Not on file    Active member of club or organization: Not on file    Attends meetings of clubs or organizations: Not on file    Relationship status: Not on file  . Intimate partner violence:    Fear of current or ex partner: Not on file    Emotionally abused: Not on file    Physically abused: Not on file    Forced sexual activity: Not on file  Other Topics Concern  . Not on file  Social History Narrative   Lives at home with her husband.   Right-handed.   1 cup caffeine per day.     PHYSICAL EXAM  Vitals:   01/27/18 1344  BP: 126/74  Pulse: 79  Weight: 178 lb (80.7 kg)  Height: 5\' 5"  (1.651 m)   Body mass index is 29.62 kg/m. Generalized: Well developed, Obese female in no acute distress  Head: normocephalic and atraumatic,. Oropharynx benign  Neck:  Supple, no carotid bruits  Cardiac: Regular rate rhythm, no murmur  Musculoskeletal: No deformity   Neurological examination   Mentation: Alert oriented to time, place, history taking. Attention span and concentration appropriate. Recent and remote memory intact. Follows all commands speech and language fluent.  Cranial nerve II-XII: Pupils were equal round reactive to light extraocular movements were full, visual field were full on confrontational test. Facial sensation and strength were normal. hearing was intact to finger rubbing bilaterally. Uvula tongue midline. head turning and shoulder shrug were normal and symmetric.Tongue protrusion into cheek strength was normal. Motor: normal bulk and tone, full strength in the BUE, BLE, fine finger movements normal, no pronator drift. No focal weakness Sensory: normal and symmetric to light touch, pinprick, and Vibration, in the  upper and lower extremities Coordination: finger-nose-finger, heel-to-shin bilaterally, no dysmetria Reflexes: Symmetric upper and lower, plantar responses were flexor bilaterally. Gait and Station: Rising up from seated position without assistance, normal stance, moderate stride, good arm swing, smooth turning, able to perform tiptoe, and heel walking without difficulty. Tandem gait is steady  DIAGNOSTIC DATA (LABS, IMAGING, TESTING) - I reviewed patient records, labs, notes, testing and imaging myself where available.  Lab Results  Component Value Date   WBC 6.1 05/04/2017   HGB 12.1 05/04/2017   HCT 35.7 (L) 05/04/2017   MCV 92.8 05/04/2017   PLT 216.0 05/04/2017      Component Value Date/Time   NA 140 05/04/2017 1333   K 3.7 05/04/2017 1333   CL 108 05/04/2017 1333   CO2 26 05/04/2017 1333   GLUCOSE 81 05/04/2017 1333   BUN 16 05/04/2017 1333   CREATININE 0.94 05/04/2017 1333   CREATININE 0.79 11/13/2015 1520   CALCIUM 9.8 05/04/2017 1333   PROT 6.4 05/04/2017 1333   ALBUMIN 4.0 05/04/2017 1333    AST 20 05/04/2017 1333   ALT 21 05/04/2017 1333   ALKPHOS 64 05/04/2017 1333   BILITOT 0.3 05/04/2017 1333   GFRNONAA >60 07/14/2016 1400   GFRAA >60 07/14/2016 1400    Lab Results  Component Value Date   TSH 1.97 05/04/2017      ASSESSMENT AND PLAN 57 y.o. year old female   Chronic migraine headaches      30 units was put into bilateral cervical paraspinal Extra 15 units was put into the left parietal temporal region.  Patient tolerate the injection well. Will return for repeat injection in 3 months.  Marcial Pacas, M.D. Ph.D.  [facility] [place], [place] [address] Phone: [phone] Fax:      [phone]

## 2018-01-27 NOTE — Telephone Encounter (Signed)
Pt. Needs 12 wk Botox inj  °

## 2018-01-27 NOTE — Progress Notes (Signed)
**  Botox 100 units x 2 vials, NDC 0023-1145-01, Lot [id number], Exp 06/2020, specialty pharmacy.//mck,rn** 

## 2018-03-10 DIAGNOSIS — H2513 Age-related nuclear cataract, bilateral: Secondary | ICD-10-CM | POA: Diagnosis not present

## 2018-03-10 DIAGNOSIS — H04123 Dry eye syndrome of bilateral lacrimal glands: Secondary | ICD-10-CM | POA: Diagnosis not present

## 2018-03-10 DIAGNOSIS — H43811 Vitreous degeneration, right eye: Secondary | ICD-10-CM | POA: Diagnosis not present

## 2018-03-15 ENCOUNTER — Encounter: Payer: BLUE CROSS/BLUE SHIELD | Admitting: Obstetrics & Gynecology

## 2018-03-26 ENCOUNTER — Telehealth: Payer: Self-pay | Admitting: Neurology

## 2018-03-26 NOTE — Telephone Encounter (Signed)
Report from [facility] stated Mar 10, 2018 showed no significant abnormality

## 2018-03-30 ENCOUNTER — Ambulatory Visit: Payer: Self-pay | Admitting: *Deleted

## 2018-03-30 NOTE — Telephone Encounter (Signed)
Patient is calling and states that she has had a headache for 7 days. She is weak, dizzy and doesn't feel "right". She would like a appt to see her doctor. Please call patient to discuss symptoms.  Call to patient - patient states she has had a dull headache that has come and gone for 7 days- today she thought it was going to be gone- but she had a dizzy spell and it came after that. Patient reports she ate and the dizzy spell got better. She thinks that she may be having a shift in her glucose levels because of recent weight gain. Appointment scheduled for evaluation of her daily headaches.  Reason for Disposition . [1] New headache AND [2] age > 72  Answer Assessment - Initial Assessment Questions 1. LOCATION: "Where does it hurt?"      All over- they were on left side- patient did get dizzy this morning- before the headache this morning 2. ONSET: "When did the headache start?" (Minutes, hours or days)      7 days- patient tracks headaches- she has Botox injections 3. PATTERN: "Does the pain come and go, or has it been constant since it started?"     Comes and goes- usually in the morning- today it started later in the morning after dizzy spell 4. SEVERITY: "How bad is the pain?" and "What does it keep you from doing?"  (e.g., Scale 1-10; mild, moderate, or severe)   - MILD (1-3): doesn't interfere with normal activities    - MODERATE (4-7): interferes with normal activities or awakens from sleep    - SEVERE (8-10): excruciating pain, unable to do any normal activities        Nagging dull pain 5. RECURRENT SYMPTOM: "Have you ever had headaches before?" If so, ask: "When was the last time?" and "What happened that time?"      History of headaches 6. CAUSE: "What do you think is causing the headache?"     Not sure- patient has gained weight and she thinks it could be glucose related  7. MIGRAINE: "Have you been diagnosed with migraine headaches?" If so, ask: "Is this headache similar?"   Not sure her headaches are classified as migraines 8. HEAD INJURY: "Has there been any recent injury to the head?"      No recent head injury 9. OTHER SYMPTOMS: "Do you have any other symptoms?" (fever, stiff neck, eye pain, sore throat, cold symptoms)     Dizziness this morning- nit feeling well, weakness 10. PREGNANCY: "Is there any chance you are pregnant?" "When was your last menstrual period?"       n/a  Protocols used: HEADACHE-A-AH

## 2018-03-30 NOTE — Telephone Encounter (Signed)
(  FYI) on tomorrow's schedule

## 2018-03-31 ENCOUNTER — Encounter: Payer: Self-pay | Admitting: Family

## 2018-03-31 ENCOUNTER — Other Ambulatory Visit (INDEPENDENT_AMBULATORY_CARE_PROVIDER_SITE_OTHER): Payer: BLUE CROSS/BLUE SHIELD

## 2018-03-31 ENCOUNTER — Ambulatory Visit: Payer: BLUE CROSS/BLUE SHIELD | Admitting: Family

## 2018-03-31 VITALS — BP 122/80 | HR 81 | Temp 98.3°F | Ht 65.0 in | Wt 173.0 lb

## 2018-03-31 DIAGNOSIS — G4489 Other headache syndrome: Secondary | ICD-10-CM | POA: Diagnosis not present

## 2018-03-31 DIAGNOSIS — R531 Weakness: Secondary | ICD-10-CM

## 2018-03-31 LAB — CBC WITH DIFFERENTIAL/PLATELET
BASOS ABS: 0.1 10*3/uL (ref 0.0–0.1)
BASOS PCT: 1.3 % (ref 0.0–3.0)
Eosinophils Absolute: 0.2 10*3/uL (ref 0.0–0.7)
Eosinophils Relative: 2.9 % (ref 0.0–5.0)
HEMATOCRIT: 37.2 % (ref 36.0–46.0)
HEMOGLOBIN: 12.7 g/dL (ref 12.0–15.0)
LYMPHS PCT: 37 % (ref 12.0–46.0)
Lymphs Abs: 2.1 10*3/uL (ref 0.7–4.0)
MCHC: 34 g/dL (ref 30.0–36.0)
MCV: 92.7 fl (ref 78.0–100.0)
MONOS PCT: 3.4 % (ref 3.0–12.0)
Monocytes Absolute: 0.2 10*3/uL (ref 0.1–1.0)
NEUTROS ABS: 3.2 10*3/uL (ref 1.4–7.7)
Neutrophils Relative %: 55.4 % (ref 43.0–77.0)
Platelets: 209 10*3/uL (ref 150.0–400.0)
RBC: 4.01 Mil/uL (ref 3.87–5.11)
RDW: 13.3 % (ref 11.5–15.5)
WBC: 5.7 10*3/uL (ref 4.0–10.5)

## 2018-03-31 LAB — COMPREHENSIVE METABOLIC PANEL
ALT: 52 U/L — ABNORMAL HIGH (ref 0–35)
AST: 36 U/L (ref 0–37)
Albumin: 4.1 g/dL (ref 3.5–5.2)
Alkaline Phosphatase: 156 U/L — ABNORMAL HIGH (ref 39–117)
BILIRUBIN TOTAL: 0.3 mg/dL (ref 0.2–1.2)
BUN: 15 mg/dL (ref 6–23)
CHLORIDE: 107 meq/L (ref 96–112)
CO2: 28 meq/L (ref 19–32)
Calcium: 9.4 mg/dL (ref 8.4–10.5)
Creatinine, Ser: 1.01 mg/dL (ref 0.40–1.20)
GFR: 59.82 mL/min — AB (ref >60.00)
Glucose, Bld: 95 mg/dL (ref 70–99)
Potassium: 3.6 mEq/L (ref 3.5–5.1)
Sodium: 142 mEq/L (ref 135–145)
Total Protein: 6.7 g/dL (ref 6.0–8.3)

## 2018-03-31 LAB — HEMOGLOBIN A1C: Hgb A1c MFr Bld: 5.5 % (ref 4.6–6.5)

## 2018-03-31 LAB — TSH: TSH: 1.26 u[IU]/mL (ref 0.35–4.50)

## 2018-03-31 LAB — VITAMIN B12: Vitamin B-12: 741 pg/mL (ref 211–911)
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
## 2018-04-01 LAB — URINE CULTURE
MICRO NUMBER:: 90676214
SPECIMEN QUALITY: ADEQUATE

## 2018-04-05 ENCOUNTER — Ambulatory Visit: Payer: Self-pay

## 2018-04-05 NOTE — Telephone Encounter (Signed)
Left message to call back to discuss labs

## 2018-04-05 NOTE — Telephone Encounter (Signed)
Pt called back to discuss labs. Unable to discuss without order from her pcp. Will send to her primary.

## 2018-04-05 NOTE — Telephone Encounter (Signed)
Spoke with patient and info given 

## 2018-04-13 ENCOUNTER — Ambulatory Visit: Payer: BLUE CROSS/BLUE SHIELD | Admitting: Internal Medicine

## 2018-04-19 ENCOUNTER — Telehealth: Payer: Self-pay | Admitting: Neurology

## 2018-04-19 NOTE — Telephone Encounter (Signed)
Authorization forms submitted to Dr. Krista Blue for signature.

## 2018-04-20 NOTE — Telephone Encounter (Signed)
I called [facility] at [phone] and requested a refill on the patients medication.

## 2018-04-20 NOTE — Telephone Encounter (Signed)
Authorization forms have been faxed over and submitted.

## 2018-04-21 ENCOUNTER — Ambulatory Visit: Payer: BLUE CROSS/BLUE SHIELD | Admitting: Internal Medicine

## 2018-04-21 NOTE — Telephone Encounter (Signed)
[employer] authorizations   [phone] [id number] (04/20/18-04/20/19).   I called [facility] to check the status, it was still pending.

## 2018-04-26 NOTE — Telephone Encounter (Signed)
I called to check status of the patients Botox medication at [facility], we scheduled delivery for 04/28/18. DW

## 2018-04-28 ENCOUNTER — Ambulatory Visit: Payer: BLUE CROSS/BLUE SHIELD | Admitting: Internal Medicine

## 2018-04-28 ENCOUNTER — Encounter: Payer: Self-pay | Admitting: Internal Medicine

## 2018-04-28 VITALS — BP 132/88 | HR 70 | Ht 65.0 in | Wt 172.0 lb

## 2018-04-28 DIAGNOSIS — G43719 Chronic migraine without aura, intractable, without status migrainosus: Secondary | ICD-10-CM

## 2018-04-28 DIAGNOSIS — M5442 Lumbago with sciatica, left side: Secondary | ICD-10-CM

## 2018-04-28 DIAGNOSIS — R748 Abnormal levels of other serum enzymes: Secondary | ICD-10-CM

## 2018-04-28 DIAGNOSIS — M5441 Lumbago with sciatica, right side: Secondary | ICD-10-CM

## 2018-04-28 DIAGNOSIS — I471 Supraventricular tachycardia: Secondary | ICD-10-CM | POA: Diagnosis not present

## 2018-04-28 DIAGNOSIS — I10 Essential (primary) hypertension: Secondary | ICD-10-CM

## 2018-04-28 DIAGNOSIS — G8929 Other chronic pain: Secondary | ICD-10-CM

## 2018-04-28 MED ORDER — OXYCODONE HCL 10 MG PO TABS: 10.0000 mg | ORAL_TABLET | Freq: Four times a day (QID) | ORAL | 0 refills | Status: DC | PRN

## 2018-04-28 MED ORDER — GALCANEZUMAB-GNLM 120 MG/ML ~~LOC~~ SOAJ: 120.0000 mg | SUBCUTANEOUS | 0 refills | Status: DC

## 2018-04-28 MED ORDER — GALCANEZUMAB-GNLM 120 MG/ML ~~LOC~~ SOAJ: 120.0000 mg | SUBCUTANEOUS | 11 refills | Status: DC

## 2018-04-28 NOTE — Progress Notes (Signed)
-     Patient Care Team: Plotnikov, Evie Lacks, MD as PCP - General Princess Bruins, MD as Consulting Physician (Obstetrics and Gynecology) Dohmeier, Asencion Partridge, MD as Consulting Physician (Neurology)   HPI  Beth Huffman is a 57 y.o. female Seen in follow-up for symptomatic nonsustained atrial tachycardia   She has hypertension.  She recently retired from her business   She notes nocturnal dyspnea relieved by sitting, new DOE assoc with predictable chest pressure without radiation + FHx and Hyperlipidemia   no edema  Recurring freq headaches for whichshe takes multiple medications  Including botox and inject immunotherapy   She has syringomyelia  Records and Results Reviewed   Past Medical History:  Diagnosis Date  . Abnormal weight gain   . GERD (gastroesophageal reflux disease)   . Hypertension   . Increased blood pressure (not hypertension) 08/23/2014  . LBP (low back pain)   . Migraines   . Palpitations 06/21/2014  . Pyelonephritis 2008  . Stress 2009  . Syringobulbia ([place])   . Syringomyelia ([place])   . Tachycardia   . UTI (lower urinary tract infection)     Past Surgical History:  Procedure Laterality Date  . ABDOMINAL HYSTERECTOMY    . ANTERIOR AND POSTERIOR REPAIR N/A 07/24/2016   Procedure: Possible ANTERIOR (CYSTOCELE) repair, perineoplasty;  Surgeon: Princess Bruins, MD;  Location: [facility];  Service: Gynecology;  Laterality: N/A;  . BLADDER SUSPENSION N/A 07/24/2016   Procedure: TRANSVAGINAL TAPE (TVT) PROCEDURE;  Surgeon: Princess Bruins, MD;  Location: [facility];  Service: Gynecology;  Laterality: N/A;  . COLONOSCOPY    . CYSTOSCOPY N/A 07/24/2016   Procedure: CYSTOSCOPY;  Surgeon: Princess Bruins, MD;  Location: [facility];  Service: Gynecology;  Laterality: N/A;  . intracranial decompression surgery  2006  . ROBOTIC ASSISTED LAPAROSCOPIC SACROCOLPOPEXY N/A 07/24/2016   Procedure: ROBOTIC ASSISTED LAPAROSCOPIC SACROCOLPOPEXY WITH PERINEOPLASTY;  Surgeon:  Princess Bruins, MD;  Location: [facility];  Service: Gynecology;  Laterality: N/A;  . TUBAL LIGATION      Current Outpatient Medications  Medication Sig Dispense Refill  . cholecalciferol (VITAMIN D) 1000 UNITS tablet Take 1,000 Units by mouth daily.      . cyclobenzaprine (FLEXERIL) 5 MG tablet TAKE 1 TABLET THREE TIMES DAILY AS NEEDED FOR MUSCLE SPASMS. 90 tablet 0  . eletriptan (RELPAX) 40 MG tablet Take 1 tablet (40 mg total) by mouth as needed for migraine or headache. One tablet by mouth at onset of headache. May repeat in 2 hours if headache persists or recurs. 10 tablet 6  . estradiol (CLIMARA - DOSED IN MG/24 HR) 0.05 mg/24hr patch Place 1 patch (0.05 mg total) onto the skin once a week. 12 patch 4  . Galcanezumab-gnlm (EMGALITY) 120 MG/ML SOAJ Inject 120 mg into the skin every 30 (thirty) days. For migraine prophylaxis 1 pen 11  . ibuprofen (IBU) 600 MG tablet Take 1 tablet (600 mg total) by mouth 3 (three) times daily. 90 tablet 1  . Multiple Vitamin (MULTIVITAMIN PO) Take 1 tablet by mouth daily.      . nortriptyline (PAMELOR) 10 MG capsule Take 2 capsules (20 mg total) by mouth at bedtime. 60 capsule 11  . ondansetron (ZOFRAN) 8 MG tablet TAKE 1 TABLET EVERY 12 HOURS IF NEEDED FOR NAUSEA. 20 tablet 11  . Oxycodone HCl 10 MG TABS Take 1 tablet (10 mg total) by mouth 4 (four) times daily as needed. 120 each 0  . Riboflavin (VITAMIN B-2 PO) Take 400 mg by mouth 2 (two)  times daily.    . SUMAtriptan (IMITREX) 100 MG tablet   10  . Topiramate ER (TROKENDI XR) 200 MG CP24 Take 200 mg by mouth at bedtime. Please notice dosage change, now only 200mg  at night 90 capsule 4  . verapamil (CALAN-SR) 120 MG CR tablet TAKE ONE TABLET AT BEDTIME. 30 tablet 10   No current facility-administered medications for this visit.     Allergies  Allergen Reactions  . Atenolol     Wt gain  . Cefuroxime Axetil     REACTION: nausea - but tolerates PCN  . Codeine Sulfate Nausea And Vomiting and Nausea  Only  . Fentanyl Nausea And Vomiting    REACTION: bad reaction  . Nitrofurantoin     REACTION: HA  . Pregabalin     REACTION: cp  . Rapaflo [Silodosin]     Orthostatic BP drop, near syncope   . Tramadol Hcl Nausea And Vomiting    REACTION: sick      Review of Systems negative except from HPI and PMH  Physical Exam BP 132/88   Pulse 70   Ht 5\' 5"  (1.651 m)   Wt 172 lb (78 kg)   SpO2 98%   BMI 28.62 kg/m    Well developed and nourished in no acute distress HENT normal Neck supple with JVP-flat Carotids brisk and full without bruits Clear Regular rate and rhythm, no murmurs or gallops Abd-soft with active BS without hepatomegaly No Clubbing cyanosis edema Skin-warm and dry A & Oriented  Grossly normal sensory and motor function   ECG sinus 70 14/10/38 No acute changes  Assessment and  Plan  Hypertension   Atrial tachycardia  Exertional chest pain with Dyspnea  Atrial tach quiescient   She has reproducibly exertional chest discomfort without radiation but accompanied by dyspnea.  She has evidence of volume on examination and nocturnal dyspnea.  Not withstanding her gender, given her hyperlipidemia and family history, her pretest likelihood of coronary disease is at least moderate.  We discussed alternative strategies including CT A versus catheterization.  We have elected to pursue the latter.  She knows Dr. Burt Knack who is cared for her family member; unfortunately he will not be available until the end of the month.  Alternative arrangements will be made.  More than 50% of 40 min was spent in counseling related to the above

## 2018-04-28 NOTE — Assessment & Plan Note (Signed)
Worse. Start Emagility inj. Relpax

## 2018-04-28 NOTE — Telephone Encounter (Signed)
I called to check status of the medication because it was scheduled for delivery today and did not arrive. The representative I spoke with said it was not shipped out because they were still verifying insurance. I asked to have it expedited.

## 2018-04-28 NOTE — Patient Instructions (Addendum)
Medication Instructions:  Begin taking   81 mg Aspirin everyday.   Labwork: You will have labs drawn today: CBC, BMP   Testing/Procedures: Your physician has requested that you have an echocardiogram. Echocardiography is a painless test that uses sound waves to create images of your heart. It provides your doctor with information about the size and shape of your heart and how well your heart's chambers and valves are working. This procedure takes approximately one hour. There are no restrictions for this procedure.  Your physician has requested that you have a cardiac catheterization. Cardiac catheterization is used to diagnose and/or treat various heart conditions. Doctors may recommend this procedure for a number of different reasons. The most common reason is to evaluate chest pain. Chest pain can be a symptom of coronary artery disease (CAD), and cardiac catheterization can show whether plaque is narrowing or blocking your heart's arteries. This procedure is also used to evaluate the valves, as well as measure the blood flow and oxygen levels in different parts of your heart. For further information please visit [email or web address]. Please follow instruction sheet, as given.   Follow-Up:  Follow up with Dr Tamala Julian or an APP 4 weeks from 7/12 (your Cathlab date).  Your physician wants you to follow-up in: One Year with Dr Gari Crown will receive a reminder letter in the mail two months in advance. If you don't receive a letter, please call our office to schedule the follow-up appointment.   Any Other Special Instructions Will Be Listed Below (If Applicable).     If you need a refill on your cardiac medications before your next appointment, please call your pharmacy.

## 2018-04-28 NOTE — Progress Notes (Signed)
Subjective:  Patient ID: Beth Huffman, female    DOB: June 03, 1961  Age: 57 y.o. MRN: [id number]  CC: No chief complaint on file.   HPI Beth Huffman presents for HAs, chronic pain, elevated alk-p  Outpatient Medications Prior to Visit  Medication Sig Dispense Refill  . cholecalciferol (VITAMIN D) 1000 UNITS tablet Take 1,000 Units by mouth daily.      . cyclobenzaprine (FLEXERIL) 5 MG tablet TAKE 1 TABLET THREE TIMES DAILY AS NEEDED FOR MUSCLE SPASMS. 90 tablet 0  . eletriptan (RELPAX) 40 MG tablet Take 1 tablet (40 mg total) by mouth as needed for migraine or headache. One tablet by mouth at onset of headache. May repeat in 2 hours if headache persists or recurs. 10 tablet 6  . estradiol (CLIMARA - DOSED IN MG/24 HR) 0.05 mg/24hr patch Place 1 patch (0.05 mg total) onto the skin once a week. 12 patch 4  . ibuprofen (IBU) 600 MG tablet Take 1 tablet (600 mg total) by mouth 3 (three) times daily. 90 tablet 1  . Multiple Vitamin (MULTIVITAMIN PO) Take 1 tablet by mouth daily.      . nortriptyline (PAMELOR) 10 MG capsule Take 2 capsules (20 mg total) by mouth at bedtime. 60 capsule 11  . ondansetron (ZOFRAN) 8 MG tablet TAKE 1 TABLET EVERY 12 HOURS IF NEEDED FOR NAUSEA. 20 tablet 11  . Oxycodone HCl 10 MG TABS Take 1 tablet (10 mg total) by mouth 4 (four) times daily as needed. 120 each 0  . Riboflavin (VITAMIN B-2 PO) Take 400 mg by mouth 2 (two) times daily.    . SUMAtriptan (IMITREX) 100 MG tablet   10  . Topiramate ER (TROKENDI XR) 200 MG CP24 Take 200 mg by mouth at bedtime. Please notice dosage change, now only 220m at night 90 capsule 4  . verapamil (CALAN-SR) 120 MG CR tablet TAKE ONE TABLET AT BEDTIME. 30 tablet 10   No facility-administered medications prior to visit.     ROS: Review of Systems  Constitutional: Negative for activity change, appetite change, chills, fatigue and unexpected weight change.  HENT: Negative for congestion, mouth sores and sinus pressure.     Eyes: Negative for visual disturbance.  Respiratory: Negative for cough and chest tightness.   Gastrointestinal: Negative for abdominal pain and nausea.  Genitourinary: Negative for difficulty urinating, frequency and vaginal pain.  Musculoskeletal: Positive for back pain. Negative for gait problem.  Skin: Negative for pallor and rash.  Neurological: Positive for headaches. Negative for dizziness, tremors, weakness and numbness.  Psychiatric/Behavioral: Negative for confusion, sleep disturbance and suicidal ideas.    Objective:  BP 128/88 (BP Location: Left Arm, Patient Position: Sitting, Cuff Size: Normal)   Pulse 92   Ht _0  (1.651 m)   Wt 172 lb (78 kg)   SpO2 96%   BMI 28.62 kg/m   BP Readings from Last 3 Encounters:  04/28/18 128/88  03/31/18 122/80  01/27/18 126/74    Wt Readings from Last 3 Encounters:  04/28/18 172 lb (78 kg)  03/31/18 173 lb (78.5 kg)  01/27/18 178 lb (80.7 kg)    Physical Exam  Constitutional: She appears well-developed. No distress.  HENT:  Head: Normocephalic.  Right Ear: External ear normal.  Left Ear: External ear normal.  Nose: Nose normal.  Mouth/Throat: Oropharynx is clear and moist.  Eyes: Pupils are equal, round, and reactive to light. Conjunctivae are normal. Right eye exhibits no discharge. Left eye exhibits no discharge.  Neck:  Normal range of motion. Neck supple. No JVD present. No tracheal deviation present. No thyromegaly present.  Cardiovascular: Normal rate, regular rhythm and normal heart sounds.  Pulmonary/Chest: No stridor. No respiratory distress. She has no wheezes.  Abdominal: Soft. Bowel sounds are normal. She exhibits no distension and no mass. There is tenderness. There is no rebound and no guarding.  Musculoskeletal: She exhibits no edema or tenderness.  Lymphadenopathy:    She has no cervical adenopathy.  Neurological: She displays normal reflexes. No cranial nerve deficit. She exhibits normal muscle tone.  Coordination normal.  Skin: No rash noted. No erythema.  Psychiatric: She has a normal mood and affect. Her behavior is normal. Judgment and thought content normal.  LS tender  Lab Results  Component Value Date   WBC 5.7 03/31/2018   HGB 12.7 03/31/2018   HCT 37.2 03/31/2018   PLT 209.0 03/31/2018   GLUCOSE 95 03/31/2018   CHOL 189 05/04/2017   TRIG 129.0 05/04/2017   HDL 52.40 05/04/2017   LDLDIRECT 130.2 12/21/2013   LDLCALC 111 (H) 05/04/2017   ALT 52 (H) 03/31/2018   AST 36 03/31/2018   NA 142 03/31/2018   K 3.6 03/31/2018   CL 107 03/31/2018   CREATININE 1.01 03/31/2018   BUN 15 03/31/2018   CO2 28 03/31/2018   TSH 1.26 03/31/2018   HGBA1C 5.5 03/31/2018    Dg Knee 1-2 Views Left  Result Date: 05/04/2017 CLINICAL DATA:  No known injury. Generalized knee pain x 1 mo. EXAM: LEFT KNEE - 1-2 VIEW COMPARISON:  None. FINDINGS: There is mild narrowing of the articular cartilage in the medial compartment. Negative for fracture or dislocation. Normal mineralization and alignment. There may be a tiny effusion in the suprapatellar bursa. Soft tissue calcification anterior to the proximal tibial shaft. IMPRESSION: 1. Negative for fracture. 2. Mild narrowing of medial compartment cartilage with small effusion. Electronically Signed   By: Lucrezia Europe M.D.   On: 05/04/2017 15:33    Assessment & Plan:   There are no diagnoses linked to this encounter.   No orders of the defined types were placed in this encounter.    Follow-up: No follow-ups on file.  Walker Kehr, MD

## 2018-04-28 NOTE — Assessment & Plan Note (Signed)
Due to syringomyelia On Oxycodone  Potential benefits of a long term opioids use as well as potential risks (i.e. addiction risk, apnea etc) and complications (i.e. Somnolence, constipation and others) were explained to the patient and were aknowledged. 

## 2018-04-28 NOTE — Assessment & Plan Note (Addendum)
[place] if not normalizes  Repeat labs D/c Exedrin

## 2018-04-28 NOTE — Assessment & Plan Note (Signed)
Start Emagility inj

## 2018-04-28 NOTE — H&P (View-Only) (Signed)
-     Patient Care Team: Plotnikov, Evie Lacks, MD as PCP - General Princess Bruins, MD as Consulting Physician (Obstetrics and Gynecology) Dohmeier, Asencion Partridge, MD as Consulting Physician (Neurology)   HPI  Beth Huffman is a 56 y.o. female Seen in follow-up for symptomatic nonsustained atrial tachycardia   She has hypertension.  She recently retired from her business   She notes nocturnal dyspnea relieved by sitting, new DOE assoc with predictable chest pressure without radiation + FHx and Hyperlipidemia   no edema  Recurring freq headaches for whichshe takes multiple medications  Including botox and inject immunotherapy   She has syringomyelia  Records and Results Reviewed   Past Medical History:  Diagnosis Date  . Abnormal weight gain   . GERD (gastroesophageal reflux disease)   . Hypertension   . Increased blood pressure (not hypertension) 08/23/2014  . LBP (low back pain)   . Migraines   . Palpitations 06/21/2014  . Pyelonephritis 2008  . Stress 2009  . Syringobulbia ([place])   . Syringomyelia ([place])   . Tachycardia   . UTI (lower urinary tract infection)     Past Surgical History:  Procedure Laterality Date  . ABDOMINAL HYSTERECTOMY    . ANTERIOR AND POSTERIOR REPAIR N/A 07/24/2016   Procedure: Possible ANTERIOR (CYSTOCELE) repair, perineoplasty;  Surgeon: Princess Bruins, MD;  Location: [facility];  Service: Gynecology;  Laterality: N/A;  . BLADDER SUSPENSION N/A 07/24/2016   Procedure: TRANSVAGINAL TAPE (TVT) PROCEDURE;  Surgeon: Princess Bruins, MD;  Location: [facility];  Service: Gynecology;  Laterality: N/A;  . COLONOSCOPY    . CYSTOSCOPY N/A 07/24/2016   Procedure: CYSTOSCOPY;  Surgeon: Princess Bruins, MD;  Location: [facility];  Service: Gynecology;  Laterality: N/A;  . intracranial decompression surgery  2006  . ROBOTIC ASSISTED LAPAROSCOPIC SACROCOLPOPEXY N/A 07/24/2016   Procedure: ROBOTIC ASSISTED LAPAROSCOPIC SACROCOLPOPEXY WITH PERINEOPLASTY;  Surgeon:  Princess Bruins, MD;  Location: [facility];  Service: Gynecology;  Laterality: N/A;  . TUBAL LIGATION      Current Outpatient Medications  Medication Sig Dispense Refill  . cholecalciferol (VITAMIN D) 1000 UNITS tablet Take 1,000 Units by mouth daily.      . cyclobenzaprine (FLEXERIL) 5 MG tablet TAKE 1 TABLET THREE TIMES DAILY AS NEEDED FOR MUSCLE SPASMS. 90 tablet 0  . eletriptan (RELPAX) 40 MG tablet Take 1 tablet (40 mg total) by mouth as needed for migraine or headache. One tablet by mouth at onset of headache. May repeat in 2 hours if headache persists or recurs. 10 tablet 6  . estradiol (CLIMARA - DOSED IN MG/24 HR) 0.05 mg/24hr patch Place 1 patch (0.05 mg total) onto the skin once a week. 12 patch 4  . Galcanezumab-gnlm (EMGALITY) 120 MG/ML SOAJ Inject 120 mg into the skin every 30 (thirty) days. For migraine prophylaxis 1 pen 11  . ibuprofen (IBU) 600 MG tablet Take 1 tablet (600 mg total) by mouth 3 (three) times daily. 90 tablet 1  . Multiple Vitamin (MULTIVITAMIN PO) Take 1 tablet by mouth daily.      . nortriptyline (PAMELOR) 10 MG capsule Take 2 capsules (20 mg total) by mouth at bedtime. 60 capsule 11  . ondansetron (ZOFRAN) 8 MG tablet TAKE 1 TABLET EVERY 12 HOURS IF NEEDED FOR NAUSEA. 20 tablet 11  . Oxycodone HCl 10 MG TABS Take 1 tablet (10 mg total) by mouth 4 (four) times daily as needed. 120 each 0  . Riboflavin (VITAMIN B-2 PO) Take 400 mg by mouth 2 (two)  times daily.    . SUMAtriptan (IMITREX) 100 MG tablet   10  . Topiramate ER (TROKENDI XR) 200 MG CP24 Take 200 mg by mouth at bedtime. Please notice dosage change, now only 200mg  at night 90 capsule 4  . verapamil (CALAN-SR) 120 MG CR tablet TAKE ONE TABLET AT BEDTIME. 30 tablet 10   No current facility-administered medications for this visit.     Allergies  Allergen Reactions  . Atenolol     Wt gain  . Cefuroxime Axetil     REACTION: nausea - but tolerates PCN  . Codeine Sulfate Nausea And Vomiting and Nausea  Only  . Fentanyl Nausea And Vomiting    REACTION: bad reaction  . Nitrofurantoin     REACTION: HA  . Pregabalin     REACTION: cp  . Rapaflo [Silodosin]     Orthostatic BP drop, near syncope   . Tramadol Hcl Nausea And Vomiting    REACTION: sick      Review of Systems negative except from HPI and PMH  Physical Exam BP 132/88   Pulse 70   Ht 5\' 5"  (1.651 m)   Wt 172 lb (78 kg)   SpO2 98%   BMI 28.62 kg/m    Well developed and nourished in no acute distress HENT normal Neck supple with JVP-flat Carotids brisk and full without bruits Clear Regular rate and rhythm, no murmurs or gallops Abd-soft with active BS without hepatomegaly No Clubbing cyanosis edema Skin-warm and dry A & Oriented  Grossly normal sensory and motor function   ECG sinus 70 14/10/38 No acute changes  Assessment and  Plan  Hypertension   Atrial tachycardia  Exertional chest pain with Dyspnea  Atrial tach quiescient   She has reproducibly exertional chest discomfort without radiation but accompanied by dyspnea.  She has evidence of volume on examination and nocturnal dyspnea.  Not withstanding her gender, given her hyperlipidemia and family history, her pretest likelihood of coronary disease is at least moderate.  We discussed alternative strategies including CT A versus catheterization.  We have elected to pursue the latter.  She knows Dr. Burt Knack who is cared for her family member; unfortunately he will not be available until the end of the month.  Alternative arrangements will be made.  More than 50% of 40 min was spent in counseling related to the above

## 2018-04-29 LAB — CBC WITH DIFFERENTIAL/PLATELET
BASOS: 1 %
Basophils Absolute: 0.1 10*3/uL (ref 0.0–0.2)
EOS (ABSOLUTE): 0.2 10*3/uL (ref 0.0–0.4)
EOS: 2 %
HEMATOCRIT: 40.2 % (ref 34.0–46.6)
Hemoglobin: 12.7 g/dL (ref 11.1–15.9)
Immature Grans (Abs): 0 10*3/uL (ref 0.0–0.1)
Immature Granulocytes: 0 %
LYMPHS ABS: 3.1 10*3/uL (ref 0.7–3.1)
Lymphs: 41 %
MCH: 30.4 pg (ref 26.6–33.0)
MCHC: 31.6 g/dL (ref 31.5–35.7)
MCV: 96 fL (ref 79–97)
MONOS ABS: 0.3 10*3/uL (ref 0.1–0.9)
Monocytes: 4 %
Neutrophils Absolute: 3.9 10*3/uL (ref 1.4–7.0)
Neutrophils: 52 %
Platelets: 257 10*3/uL (ref 150–450)
RBC: 4.18 x10E6/uL (ref 3.77–5.28)
RDW: 13.6 % (ref 12.3–15.4)
WBC: 7.5 10*3/uL (ref 3.4–10.8)

## 2018-04-29 LAB — BASIC METABOLIC PANEL
BUN / CREAT RATIO: 20 (ref 9–23)
BUN: 20 mg/dL (ref 6–24)
CO2: 23 mmol/L (ref 20–29)
CREATININE: 0.98 mg/dL (ref 0.57–1.00)
Calcium: 9.8 mg/dL (ref 8.7–10.2)
Chloride: 106 mmol/L (ref 96–106)
GFR, EST AFRICAN AMERICAN: 74 mL/min/{1.73_m2} (ref >59)
GFR, EST NON AFRICAN AMERICAN: 64 mL/min/{1.73_m2} (ref >59)
Glucose: 92 mg/dL (ref 65–99)
Potassium: 4.2 mmol/L (ref 3.5–5.2)
SODIUM: 145 mmol/L — AB (ref 134–144)

## 2018-05-03 ENCOUNTER — Ambulatory Visit (HOSPITAL_COMMUNITY): Payer: BLUE CROSS/BLUE SHIELD | Attending: Cardiology

## 2018-05-03 ENCOUNTER — Other Ambulatory Visit: Payer: Self-pay

## 2018-05-03 ENCOUNTER — Telehealth: Payer: Self-pay | Admitting: *Deleted

## 2018-05-03 DIAGNOSIS — I471 Supraventricular tachycardia: Secondary | ICD-10-CM | POA: Diagnosis not present

## 2018-05-03 DIAGNOSIS — M545 Low back pain: Secondary | ICD-10-CM | POA: Insufficient documentation

## 2018-05-03 DIAGNOSIS — I10 Essential (primary) hypertension: Secondary | ICD-10-CM | POA: Insufficient documentation

## 2018-05-03 NOTE — Telephone Encounter (Signed)
I called to check status of the medication and it is still pending insurance verification. I spoke with Olin Hauser at [employer].

## 2018-05-03 NOTE — Telephone Encounter (Signed)
Emgality inj PA initiated via CoverMyMeds.  Key: [id number]

## 2018-05-04 DIAGNOSIS — G43719 Chronic migraine without aura, intractable, without status migrainosus: Secondary | ICD-10-CM | POA: Diagnosis not present

## 2018-05-04 NOTE — Telephone Encounter (Signed)
I called to check status of the medication but it is still pending in insurance verification.

## 2018-05-05 ENCOUNTER — Telehealth: Payer: Self-pay | Admitting: *Deleted

## 2018-05-05 ENCOUNTER — Ambulatory Visit: Payer: BLUE CROSS/BLUE SHIELD | Admitting: Neurology

## 2018-05-05 ENCOUNTER — Encounter: Payer: Self-pay | Admitting: Neurology

## 2018-05-05 VITALS — BP 148/82 | HR 77 | Ht 65.0 in | Wt 172.0 lb

## 2018-05-05 DIAGNOSIS — G43719 Chronic migraine without aura, intractable, without status migrainosus: Secondary | ICD-10-CM | POA: Diagnosis not present

## 2018-05-05 MED ORDER — PREDNISONE 50 MG PO TABS: ORAL_TABLET | ORAL | 0 refills | Status: DC

## 2018-05-05 MED ORDER — RIZATRIPTAN BENZOATE 10 MG PO TBDP: 10.0000 mg | ORAL_TABLET | ORAL | 6 refills | Status: DC | PRN

## 2018-05-05 NOTE — Telephone Encounter (Signed)
Pt contacted pre-catheterization scheduled at [facility] for: Friday May 07, 2018 7:30 AM Verified arrival time and place: [place] Entrance A at: 5:30 AM  No solid food after midnight prior to cath, clear liquids until 5 AM day of procedure. Verified allergies in Epic Verified no diabetes medications.  AM meds can be  taken pre-cath with sip of water including: ASA 81 mg Benadryl 50 mg Prednisone 50 mg  Confirmed patient has responsible person to drive home post procedure and for 24 hours after you arrive home: yes  Pt states she did have rash after scan using contrast about 13 years ago. Pt states she has not had testing using contrast since then.  I instructed pt on 13 hour Prednisone and Benadryl Prep pre -procedure, she verbalized understanding, thanked me for call.

## 2018-05-05 NOTE — Progress Notes (Signed)
[facility]  PATIENT: Beth Huffman DOB: October 03, 1961  HISTORY OF PRESENT ILLNESS: Beth Huffman is a 57 years old right-handed female, referred by her primary care physician Dr. Lauraine Rinne evaluation of chronic headaches, initial evaluation was on August 21, 2016.  I saw her previously in May 2012 for similar complaints, she presented with chronic headaches since early 1990s, extensive imaging study in the past showed evidence of giant cistern magna, she underwent occipital posterior fossa decompression surgery in [facility] in 2005 with some improvement of her headaches.   I have reviewed MRI report in 1994, from [place] system prior to her posterior fossa decompression surgery, showed a large CSF signal density in the posterior fossa, posterior to the vermis, in the midline. 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 Additional syrinx is present from T3-T12 levels enlarging to maximum diameter at the T4 level measuring 74mm and at the T10 level measuring 72mm. Incidental note made of bilateral perineural cysts at T10 level.  CT cystogram in 2007, following decompression surgery: Again demonstrated is a cystic structure in the posterior fossa. There is a mega cisterna magna present. In addition the is a more focal collection within the posterior fossa. This area completely fills with contrast with no differential filling demonstrated to suggest residual cyst. A small amount of interventricular contrast is demonstrated. There is no hydrocephalus. There is no other intracranial mass or mass-effect. Postoperative changes of a suboccipital craniotomy are present.  She has been followed up by neurosurgeon Dr. Luiz Ochoa.  EMG nerve conduction study in 2007, evidence of mild  bilateral carpal tunnel syndromes.  Since May 2016, she had more severe, frequent headaches, bilateral area severe pounding headaches, lasting for few days, failed to improved by Tylenol, ibuprofen, Zofran, BC powder, repeat dose of Imitrex, eventually presented to urgent care, her headache was improved after receiving Demerol, Phenergan shots, sleeping, She continues to have headaches 2-3 times each week, lasting few hours to 2 days, bilateral retro-orbital area severe pounding headache with associated light noise sensitivity, nauseous,   Over the years, she has tried different preventive medications, currently still taking Topamax 200 mg twice a day, verapamil 120 mg daily, has not tried Inderal, nortriptyline, questionable benefit from limited trial of Botox in the past, Trigger for her headaches are weather change, oversleep, stress, For abortive treatment, she has tried Maxalt, Relpax, Zomig Currently she is taking Imitrex 100 mg 10 tablets each months,  UPDATE July 27th 2016 Her headache has much improved, she has average 4-5 severe headaches each months, Imitrex half to one tablets as needed, plus naproxen 500 mg as needed has been helpful, her insurance does not cover [place]ia  She is overall happy about current progress, will stay on Topamax 200 mg twice a day, verapamil 120 mg a day, and nortriptyline, she complains mild dizziness, left ear reining recently, no hearing loss  Update October 28, 2017: This is her first Botox injection as migraine prevention, she has average 15 migraine day in a month, Imitrex tablets works well for her migraine headaches, she woke up one morning with headaches again, sometimes her migraine can last for 7 days. She is taking preventive medications Topamax 100 mg twice a day, nortriptyline 10 mg every day, complains of dry mouth, sleepiness  UPDATE January 27 2018: She did has mild to moderate response to Botox, the benefit last about 2 months, at the peak of  benefit, she has 1-2 migraine each week, now wearing off, noticed increased headache, 2-3 migraine headaches each week, less optimal response to Maxalt, and the Imitrex, was given a prescription of Relpax, not sure about the response,  She complains of medicine side effect at nighttime, will decrease Trokendi to 200 mg every night  UPDATE May 05 2018: She responded very well to previous Botox injection, but still has 10-15 migraine headaches days in the month, she recently received emagality just few days ago, tolerated injection well, Imitrex 100 mg as needed works okay most of the time, has high co-pay for Relpax,  REVIEW OF SYSTEMS: Full 14 system review of systems performed and notable only for those listed, all others are neg:  Headache, ringing the ears   ALLERGIES: Allergies  Allergen Reactions  . Atenolol     Wt gain  . Cefuroxime Axetil     REACTION: nausea - but tolerates PCN  . Codeine Sulfate Nausea And Vomiting and Nausea Only  . Fentanyl Nausea And Vomiting    REACTION: bad reaction  . Nitrofurantoin   . Pregabalin     REACTION: cp  . Rapaflo [Silodosin]     Orthostatic BP drop, near syncope   . Tramadol Hcl Nausea And Vomiting    REACTION: sick  . Iodinated Diagnostic Agents Rash    HOME MEDICATIONS: Outpatient Medications Prior to Visit  Medication Sig Dispense Refill  . cholecalciferol (VITAMIN D) 1000 UNITS tablet Take 1,000 Units by mouth daily.      . cyclobenzaprine (FLEXERIL) 5 MG tablet TAKE 1 TABLET THREE TIMES DAILY AS NEEDED FOR MUSCLE SPASMS. 90 tablet 0  . eletriptan (RELPAX) 40 MG tablet Take 1 tablet (40 mg total) by mouth as needed for migraine or headache. One tablet by mouth at onset of headache. May repeat in 2 hours if headache persists or recurs. 10 tablet 6  . estradiol (CLIMARA - DOSED IN MG/24 HR) 0.05 mg/24hr patch Place 1 patch (0.05 mg total) onto the skin once a week. 12 patch 4  . Galcanezumab-gnlm (EMGALITY) 120 MG/ML SOAJ Inject 120  mg into the skin every 30 (thirty) days. For migraine prophylaxis 1 pen 11  . ibuprofen (IBU) 600 MG tablet Take 1 tablet (600 mg total) by mouth 3 (three) times daily. 90 tablet 1  . Multiple Vitamin (MULTIVITAMIN PO) Take 1 tablet by mouth daily.      . nortriptyline (PAMELOR) 10 MG capsule Take 2 capsules (20 mg total) by mouth at bedtime. 60 capsule 11  . ondansetron (ZOFRAN) 8 MG tablet TAKE 1 TABLET EVERY 12 HOURS IF NEEDED FOR NAUSEA. 20 tablet 11  . OVER THE COUNTER MEDICATION Apply 1 application topically daily as needed (spinal pain). CBD Cream    . Oxycodone HCl 10 MG TABS Take 1 tablet (10 mg total) by mouth 4 (four) times daily as needed. (Patient taking differently: Take 10 mg by mouth 3 (three) times daily. ) 120 each 0  . predniSONE (DELTASONE) 50 MG tablet 1 tablet 6:30 PM 05/06/18 , 1 tablet 12:30 AM 05/07/18, 1 tablet AND  Benadryl 50 mg prior to leaving for hospital  05/07/18. 3 tablet 0  . Riboflavin (VITAMIN B-2 PO) Take 400 mg by mouth 2 (two) times daily.    . SUMAtriptan (IMITREX) 100 MG tablet Take 100 mg by mouth every 2 (two) hours as needed.  10  . Topiramate ER (TROKENDI XR) 200 MG CP24 Take 200 mg by mouth at bedtime. Please notice dosage change, now only 200mg  at night (Patient taking differently: Take 200 mg by mouth 2 (two) times daily. ) 90 capsule 4  . verapamil (CALAN-SR) 120 MG CR tablet TAKE ONE TABLET AT BEDTIME. 30 tablet 10   No facility-administered medications prior to visit.     PAST MEDICAL HISTORY: Past Medical History:  Diagnosis Date  . Abnormal weight gain   . GERD (gastroesophageal reflux disease)   . Hypertension   . Increased blood pressure (not hypertension) 08/23/2014  . LBP (low back pain)   . Migraines   . Palpitations 06/21/2014  . Pyelonephritis 2008  . Stress 2009  . Syringobulbia ([place])   . Syringomyelia ([place])   . Tachycardia   . UTI (lower urinary tract infection)     PAST SURGICAL HISTORY: Past Surgical History:  Procedure  Laterality Date  . ABDOMINAL HYSTERECTOMY    . ANTERIOR AND POSTERIOR REPAIR N/A 07/24/2016   Procedure: Possible ANTERIOR (CYSTOCELE) repair, perineoplasty;  Surgeon: Princess Bruins, MD;  Location: [facility];  Service: Gynecology;  Laterality: N/A;  . BLADDER SUSPENSION N/A 07/24/2016   Procedure: TRANSVAGINAL TAPE (TVT) PROCEDURE;  Surgeon: Princess Bruins, MD;  Location: [facility];  Service: Gynecology;  Laterality: N/A;  . COLONOSCOPY    . CYSTOSCOPY N/A 07/24/2016   Procedure: CYSTOSCOPY;  Surgeon: Princess Bruins, MD;  Location: [facility];  Service: Gynecology;  Laterality: N/A;  . intracranial decompression surgery  2006  . ROBOTIC ASSISTED LAPAROSCOPIC SACROCOLPOPEXY N/A 07/24/2016   Procedure: ROBOTIC ASSISTED LAPAROSCOPIC SACROCOLPOPEXY WITH PERINEOPLASTY;  Surgeon: Princess Bruins, MD;  Location: [facility];  Service: Gynecology;  Laterality: N/A;  . TUBAL LIGATION      FAMILY HISTORY: Family History  Problem Relation Age of Onset  . COPD Mother   . Peripheral vascular disease Mother   . Hypertension Mother   . Heart disease Mother   . Hypertension Father   . Anxiety disorder Other   . Heart attack Maternal Grandmother   . Stroke Maternal Grandfather   . Stroke Paternal Grandfather   . Colon cancer Maternal Uncle 19  . Breast cancer Maternal Aunt     SOCIAL HISTORY: Social History   Socioeconomic History  . Marital status: Married    Spouse name: Not on file  . Number of children: 2  . Years of education: College  . Highest education level: Not on file  Occupational History  . Occupation: Office manager: SELF-EMPLOYED  Social Needs  . Financial resource strain: Not on file  . Food insecurity:    Worry: Not on file    Inability: Not on file  . Transportation needs:    Medical: Not on file    Non-medical: Not on file  Tobacco Use  . Smoking status: Never Smoker  . Smokeless tobacco: Never Used  Substance and Sexual Activity  . Alcohol use: No    . Drug use: No  . Sexual activity: Yes  Lifestyle  . Physical activity:    Days per week: Not on file    Minutes per session: Not on file  . Stress: Not on file  Relationships  . Social connections:    Talks on phone: Not on file    Gets together: Not on file    Attends religious service: Not on file    Active member of club or organization: Not on file  Attends meetings of clubs or organizations: Not on file    Relationship status: Not on file  . Intimate partner violence:    Fear of current or ex partner: Not on file    Emotionally abused: Not on file    Physically abused: Not on file    Forced sexual activity: Not on file  Other Topics Concern  . Not on file  Social History Narrative   Lives at home with her husband.   Right-handed.   1 cup caffeine per day.     PHYSICAL EXAM  Vitals:   05/05/18 1414  BP: (!) 148/82  Pulse: 77  Weight: 172 lb (78 kg)  Height: 5\' 5"  (1.651 m)   Body mass index is 28.62 kg/m. Generalized: Well developed, Obese female in no acute distress  Head: normocephalic and atraumatic,. Oropharynx benign  Neck: Supple, no carotid bruits  Cardiac: Regular rate rhythm, no murmur  Musculoskeletal: No deformity   Neurological examination   Mentation: Alert oriented to time, place, history taking. Attention span and concentration appropriate. Recent and remote memory intact. Follows all commands speech and language fluent.   Cranial nerve II-XII: Pupils were equal round reactive to light extraocular movements were full, visual field were full on confrontational test. Facial sensation and strength were normal. hearing was intact to finger rubbing bilaterally. Uvula tongue midline. head turning and shoulder shrug were normal and symmetric.Tongue protrusion into cheek strength was normal. Motor: normal bulk and tone, full strength in the BUE, BLE, fine finger movements normal, no pronator drift. No focal weakness Sensory: normal and  symmetric to light touch, pinprick, and Vibration, in the  upper and lower extremities Coordination: finger-nose-finger, heel-to-shin bilaterally, no dysmetria Reflexes: Symmetric upper and lower, plantar responses were flexor bilaterally. Gait and Station: Rising up from seated position without assistance, normal stance, moderate stride, good arm swing, smooth turning, able to perform tiptoe, and heel walking without difficulty. Tandem gait is steady  DIAGNOSTIC DATA (LABS, IMAGING, TESTING) - I reviewed patient records, labs, notes, testing and imaging myself where available.  Lab Results  Component Value Date   WBC 7.5 04/28/2018   HGB 12.7 04/28/2018   HCT 40.2 04/28/2018   MCV 96 04/28/2018   PLT 257 04/28/2018      Component Value Date/Time   NA 145 (H) 04/28/2018 1358   K 4.2 04/28/2018 1358   CL 106 04/28/2018 1358   CO2 23 04/28/2018 1358   GLUCOSE 92 04/28/2018 1358   GLUCOSE 95 03/31/2018 1119   BUN 20 04/28/2018 1358   CREATININE 0.98 04/28/2018 1358   CREATININE 0.79 11/13/2015 1520   CALCIUM 9.8 04/28/2018 1358   PROT 6.7 03/31/2018 1119   ALBUMIN 4.1 03/31/2018 1119   AST 36 03/31/2018 1119   ALT 52 (H) 03/31/2018 1119   ALKPHOS 156 (H) 03/31/2018 1119   BILITOT 0.3 03/31/2018 1119   GFRNONAA 64 04/28/2018 1358   GFRAA 74 04/28/2018 1358    Lab Results  Component Value Date   TSH 1.26 03/31/2018      ASSESSMENT AND PLAN 57 y.o. year old female   Chronic migraine headaches      We skip the injection at bilateral corrugate and procereus  40 units was put into bilateral cervical paraspinal Extra 20 units was put into the right parietal temporal region.  Patient tolerate the injection well. Will return for repeat injection in 3 months.  Marcial Pacas, M.D. Ph.D.  [facility] [place], [place] [address] Phone:  [phone] Fax:      [phone]

## 2018-05-05 NOTE — Progress Notes (Signed)
**  Botox 100 units x 2 vials, NDC 4695-0722-57, Lot [id number], Exp 10/2020, specialty pharmacy.//mck,rn**

## 2018-05-07 ENCOUNTER — Other Ambulatory Visit: Payer: Self-pay

## 2018-05-07 ENCOUNTER — Encounter (HOSPITAL_COMMUNITY)
Admission: RE | Disposition: A | Discharge status: Home / Self Care | Payer: Self-pay | Source: Ambulatory Visit | Attending: Interventional Cardiology

## 2018-05-07 ENCOUNTER — Ambulatory Visit (HOSPITAL_COMMUNITY)
Admission: RE | Admit: 2018-05-07 | Discharge: 2018-05-07 | Disposition: A | Discharge status: Home / Self Care | Payer: BLUE CROSS/BLUE SHIELD | Source: Ambulatory Visit | Attending: Interventional Cardiology | Admitting: Interventional Cardiology

## 2018-05-07 ENCOUNTER — Encounter (HOSPITAL_COMMUNITY): Payer: Self-pay | Admitting: Interventional Cardiology

## 2018-05-07 DIAGNOSIS — E785 Hyperlipidemia, unspecified: Secondary | ICD-10-CM | POA: Diagnosis not present

## 2018-05-07 DIAGNOSIS — G43909 Migraine, unspecified, not intractable, without status migrainosus: Secondary | ICD-10-CM | POA: Diagnosis not present

## 2018-05-07 DIAGNOSIS — K219 Gastro-esophageal reflux disease without esophagitis: Secondary | ICD-10-CM | POA: Diagnosis not present

## 2018-05-07 DIAGNOSIS — I471 Supraventricular tachycardia: Secondary | ICD-10-CM | POA: Insufficient documentation

## 2018-05-07 DIAGNOSIS — I10 Essential (primary) hypertension: Secondary | ICD-10-CM | POA: Insufficient documentation

## 2018-05-07 DIAGNOSIS — R0600 Dyspnea, unspecified: Secondary | ICD-10-CM

## 2018-05-07 DIAGNOSIS — R002 Palpitations: Secondary | ICD-10-CM | POA: Diagnosis present

## 2018-05-07 DIAGNOSIS — I259 Chronic ischemic heart disease, unspecified: Secondary | ICD-10-CM

## 2018-05-07 DIAGNOSIS — R0789 Other chest pain: Secondary | ICD-10-CM | POA: Insufficient documentation

## 2018-05-07 DIAGNOSIS — G950 Syringomyelia and syringobulbia: Secondary | ICD-10-CM | POA: Insufficient documentation

## 2018-05-07 DIAGNOSIS — R0609 Other forms of dyspnea: Secondary | ICD-10-CM

## 2018-05-07 HISTORY — PX: LEFT HEART CATH AND CORONARY ANGIOGRAPHY: CATH118249

## 2018-05-07 HISTORY — DX: Other chest pain: R07.89

## 2018-05-07 HISTORY — DX: Dyspnea, unspecified: R06.00

## 2018-05-07 SURGERY — LEFT HEART CATH AND CORONARY ANGIOGRAPHY
Anesthesia: LOCAL

## 2018-05-07 MED ORDER — ASPIRIN 81 MG PO CHEW: 81.0000 mg | CHEWABLE_TABLET | ORAL | Status: DC

## 2018-05-07 MED ORDER — SODIUM CHLORIDE 0.9 % IV SOLN: INTRAVENOUS | Status: DC

## 2018-05-07 MED ORDER — ACETAMINOPHEN 325 MG PO TABS: 650.0000 mg | ORAL_TABLET | ORAL | Status: DC | PRN

## 2018-05-07 MED ORDER — VERAPAMIL HCL 2.5 MG/ML IV SOLN
INTRAVENOUS | Status: AC
  Filled 2018-05-07: qty 2

## 2018-05-07 MED ORDER — SODIUM CHLORIDE 0.9 % IV SOLN: 250.0000 mL | INTRAVENOUS | Status: DC | PRN

## 2018-05-07 MED ORDER — MIDAZOLAM HCL 2 MG/2ML IJ SOLN
INTRAMUSCULAR | Status: AC
  Filled 2018-05-07: qty 2

## 2018-05-07 MED ORDER — HEPARIN SODIUM (PORCINE) 1000 UNIT/ML IJ SOLN
INTRAMUSCULAR | Status: DC | PRN
  Administered 2018-05-07: 4000 [IU] via INTRAVENOUS

## 2018-05-07 MED ORDER — SODIUM CHLORIDE 0.9 % WEIGHT BASED INFUSION: 1.0000 mL/kg/h | INTRAVENOUS | Status: DC

## 2018-05-07 MED ORDER — IOHEXOL 350 MG/ML SOLN
INTRAVENOUS | Status: DC | PRN
  Administered 2018-05-07: 60 mL via INTRA_ARTERIAL

## 2018-05-07 MED ORDER — SODIUM CHLORIDE 0.9% FLUSH: 3.0000 mL | Freq: Two times a day (BID) | INTRAVENOUS | Status: DC

## 2018-05-07 MED ORDER — LIDOCAINE HCL (PF) 1 % IJ SOLN
INTRAMUSCULAR | Status: AC
  Filled 2018-05-07: qty 30

## 2018-05-07 MED ORDER — ONDANSETRON HCL 4 MG/2ML IJ SOLN: 4.0000 mg | Freq: Four times a day (QID) | INTRAMUSCULAR | Status: DC | PRN

## 2018-05-07 MED ORDER — SODIUM CHLORIDE 0.9% FLUSH: 3.0000 mL | INTRAVENOUS | Status: DC | PRN

## 2018-05-07 MED ORDER — MIDAZOLAM HCL 2 MG/2ML IJ SOLN
INTRAMUSCULAR | Status: DC | PRN
  Administered 2018-05-07 (×2): 1 mg via INTRAVENOUS

## 2018-05-07 MED ORDER — LIDOCAINE HCL (PF) 1 % IJ SOLN
INTRAMUSCULAR | Status: DC | PRN
  Administered 2018-05-07: 2 mL via INTRADERMAL

## 2018-05-07 MED ORDER — HEPARIN SODIUM (PORCINE) 1000 UNIT/ML IJ SOLN
INTRAMUSCULAR | Status: AC
  Filled 2018-05-07: qty 1

## 2018-05-07 MED ORDER — SODIUM CHLORIDE 0.9 % WEIGHT BASED INFUSION
3.0000 mL/kg/h | INTRAVENOUS | Status: AC
  Administered 2018-05-07: 3 mL/kg/h via INTRAVENOUS

## 2018-05-07 MED ORDER — HEPARIN (PORCINE) IN NACL 1000-0.9 UT/500ML-% IV SOLN
INTRAVENOUS | Status: AC
  Filled 2018-05-07: qty 1000

## 2018-05-07 MED ORDER — HEPARIN (PORCINE) IN NACL 1000-0.9 UT/500ML-% IV SOLN
INTRAVENOUS | Status: DC | PRN
  Administered 2018-05-07 (×2): 500 mL

## 2018-05-07 MED ORDER — VERAPAMIL HCL 2.5 MG/ML IV SOLN
INTRAVENOUS | Status: DC | PRN
  Administered 2018-05-07: 10 mL via INTRA_ARTERIAL

## 2018-05-07 SURGICAL SUPPLY — 12 items
CATH INFINITI 5 FR JL3.5 (CATHETERS) ×2 IMPLANT
CATH INFINITI JR4 5F (CATHETERS) ×2 IMPLANT
COVER PRB 48X5XTLSCP FOLD TPE (BAG) ×1 IMPLANT
COVER PROBE 5X48 (BAG) ×1
DEVICE RAD COMP TR BAND LRG (VASCULAR PRODUCTS) ×2 IMPLANT
GLIDESHEATH SLEND A-KIT 6F 22G (SHEATH) ×2 IMPLANT
GUIDEWIRE INQWIRE 1.5J.035X260 (WIRE) ×1 IMPLANT
INQWIRE 1.5J .035X260CM (WIRE) ×2
KIT HEART LEFT (KITS) ×2 IMPLANT
PACK CARDIAC CATHETERIZATION (CUSTOM PROCEDURE TRAY) ×2 IMPLANT
TRANSDUCER W/STOPCOCK (MISCELLANEOUS) ×2 IMPLANT
TUBING CIL FLEX 10 FLL-RA (TUBING) ×2 IMPLANT

## 2018-05-07 NOTE — Interval H&P Note (Signed)
Cath Lab Visit (complete for each Cath Lab visit)  Clinical Evaluation Leading to the Procedure:   ACS: No.  Non-ACS:    Anginal Classification: CCS III  Anti-ischemic medical therapy: Minimal Therapy (1 class of medications)  Non-Invasive Test Results: No non-invasive testing performed  Prior CABG: No previous CABG      History and Physical Interval Note:  05/07/2018 7:24 AM  Beth Huffman  has presented today for surgery, with the diagnosis of Chest pain  The various methods of treatment have been discussed with the patient and family. After consideration of risks, benefits and other options for treatment, the patient has consented to  Procedure(s): LEFT HEART CATH AND CORONARY ANGIOGRAPHY (N/A) as a surgical intervention .  The patient's history has been reviewed, patient examined, no change in status, stable for surgery.  I have reviewed the patient's chart and labs.  Questions were answered to the patient's satisfaction.     Belva Crome III

## 2018-05-07 NOTE — Discharge Instructions (Signed)

## 2018-05-11 NOTE — Telephone Encounter (Signed)
Sam with [employer] needs to know if pt has been treated with a Botox injection in the last 3 months or will the pt taking Botox injections while on the Emgality. CB#: [id number]. Ref#: [id number]

## 2018-05-12 NOTE — Telephone Encounter (Signed)
PA denied due to pt recieving Botox inj

## 2018-06-08 DIAGNOSIS — I471 Supraventricular tachycardia: Secondary | ICD-10-CM | POA: Insufficient documentation
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
## 2018-06-09 ENCOUNTER — Encounter: Payer: Self-pay | Admitting: Physician Assistant

## 2018-06-09 ENCOUNTER — Ambulatory Visit: Payer: BLUE CROSS/BLUE SHIELD | Admitting: Physician Assistant

## 2018-06-13 ENCOUNTER — Other Ambulatory Visit: Payer: Self-pay | Admitting: Internal Medicine

## 2018-07-21 DIAGNOSIS — M1712 Unilateral primary osteoarthritis, left knee: Secondary | ICD-10-CM | POA: Diagnosis not present

## 2018-07-21 DIAGNOSIS — M25572 Pain in left ankle and joints of left foot: Secondary | ICD-10-CM | POA: Diagnosis not present

## 2018-07-21 DIAGNOSIS — I10 Essential (primary) hypertension: Secondary | ICD-10-CM | POA: Diagnosis not present

## 2018-07-22 ENCOUNTER — Telehealth: Payer: Self-pay | Admitting: Internal Medicine

## 2018-07-22 NOTE — Telephone Encounter (Signed)
   [facility] Pre-operative Risk Assessment    Request for surgical clearance:  1. What type of surgery is being performed?  Left partial knee replacement   2. When is this surgery scheduled?  09/07/18   3. What type of clearance is required (medical clearance vs. Pharmacy clearance to hold med vs. Both)?  Medical  4. Are there any medications that need to be held prior to surgery and how long? None   5. Practice name and name of physician performing surgery? [facility]- Dr. Edmonia Lynch   6. What is your office phone number? [phone]    7.   What is your office fax number? [email or web address]   Anesthesia type (None, local, MAC, general) ? Not mentioned   _________________________________________________________________   (provider comments below)

## 2018-07-22 NOTE — Telephone Encounter (Signed)
Dr. Caryl Comes I see pt had normal cardiac cath an now needs Lt partial knee replacement.  Just checking no reason not to clear.  Thanks.

## 2018-07-26 NOTE — Telephone Encounter (Signed)
I do not see any change in her risk based off of Dr. Olin Pia notes.  Would be intermediate risk for intermediate risk procedure.

## 2018-08-01 ENCOUNTER — Other Ambulatory Visit: Payer: Self-pay | Admitting: Internal Medicine

## 2018-08-02 ENCOUNTER — Telehealth: Payer: Self-pay | Admitting: Neurology

## 2018-08-02 NOTE — Telephone Encounter (Signed)
[phone] (04/20/18-04/20/19).

## 2018-08-03 ENCOUNTER — Telehealth: Payer: Self-pay | Admitting: Neurology

## 2018-08-03 ENCOUNTER — Ambulatory Visit: Payer: BLUE CROSS/BLUE SHIELD | Admitting: Internal Medicine

## 2018-08-03 ENCOUNTER — Other Ambulatory Visit (INDEPENDENT_AMBULATORY_CARE_PROVIDER_SITE_OTHER): Payer: BLUE CROSS/BLUE SHIELD

## 2018-08-03 ENCOUNTER — Encounter: Payer: Self-pay | Admitting: Internal Medicine

## 2018-08-03 VITALS — BP 126/82 | HR 74 | Temp 98.4°F | Ht 65.0 in | Wt 170.0 lb

## 2018-08-03 DIAGNOSIS — G8929 Other chronic pain: Secondary | ICD-10-CM

## 2018-08-03 DIAGNOSIS — R748 Abnormal levels of other serum enzymes: Secondary | ICD-10-CM

## 2018-08-03 DIAGNOSIS — G950 Syringomyelia and syringobulbia: Secondary | ICD-10-CM

## 2018-08-03 DIAGNOSIS — Z23 Encounter for immunization: Secondary | ICD-10-CM

## 2018-08-03 DIAGNOSIS — M5442 Lumbago with sciatica, left side: Secondary | ICD-10-CM

## 2018-08-03 DIAGNOSIS — M25562 Pain in left knee: Secondary | ICD-10-CM | POA: Diagnosis not present

## 2018-08-03 DIAGNOSIS — M5441 Lumbago with sciatica, right side: Secondary | ICD-10-CM | POA: Diagnosis not present

## 2018-08-03 DIAGNOSIS — G43719 Chronic migraine without aura, intractable, without status migrainosus: Secondary | ICD-10-CM

## 2018-08-03 LAB — BASIC METABOLIC PANEL
BUN: 20 mg/dL (ref 6–23)
CO2: 24 mEq/L (ref 19–32)
CREATININE: 1.11 mg/dL (ref 0.40–1.20)
Calcium: 9.5 mg/dL (ref 8.4–10.5)
Chloride: 107 mEq/L (ref 96–112)
GFR: 53.58 mL/min — AB (ref >60.00)
GLUCOSE: 101 mg/dL — AB (ref 70–99)
POTASSIUM: 3.8 meq/L (ref 3.5–5.1)
Sodium: 139 mEq/L (ref 135–145)

## 2018-08-03 LAB — HEPATIC FUNCTION PANEL
ALBUMIN: 4.3 g/dL (ref 3.5–5.2)
ALT: 35 U/L (ref 0–35)
AST: 29 U/L (ref 0–37)
Alkaline Phosphatase: 94 U/L (ref 39–117)
Bilirubin, Direct: 0.1 mg/dL (ref 0.0–0.3)
Total Bilirubin: 0.5 mg/dL (ref 0.2–1.2)
Total Protein: 7 g/dL (ref 6.0–8.3)

## 2018-08-03 MED ORDER — CYCLOBENZAPRINE HCL 5 MG PO TABS: ORAL_TABLET | ORAL | 0 refills | Status: DC

## 2018-08-03 MED ORDER — OXYCODONE HCL 10 MG PO TABS: 10.0000 mg | ORAL_TABLET | Freq: Four times a day (QID) | ORAL | 0 refills | Status: DC | PRN

## 2018-08-03 MED ORDER — RIZATRIPTAN BENZOATE 10 MG PO TABS: 10.0000 mg | ORAL_TABLET | Freq: Once | ORAL | 12 refills | Status: DC | PRN

## 2018-08-03 NOTE — Assessment & Plan Note (Signed)
Due to syringomyelia On Oxycodone  Potential benefits of a long term opioids use as well as potential risks (i.e. addiction risk, apnea etc) and complications (i.e. Somnolence, constipation and others) were explained to the patient and were aknowledged. 

## 2018-08-03 NOTE — Assessment & Plan Note (Signed)
On Emagility inj - better. [place]

## 2018-08-03 NOTE — Assessment & Plan Note (Signed)
Dr Krista Blue Dr Jalene Mullet at [facility] Chronic pain

## 2018-08-03 NOTE — Addendum Note (Signed)
Addended by: Karren Cobble on: 08/03/2018 10:59 AM   Modules accepted: Orders

## 2018-08-03 NOTE — Assessment & Plan Note (Signed)
L knee surgery pending

## 2018-08-03 NOTE — Progress Notes (Signed)
Subjective:  Patient ID: Beth Huffman, female    DOB: Mar 01, 1961  Age: 57 y.o. MRN: [id number]  CC: No chief complaint on file.   HPI Beth Huffman presents for LBP, knee pain, HAs f/u   Outpatient Medications Prior to Visit  Medication Sig Dispense Refill  . cholecalciferol (VITAMIN D) 1000 UNITS tablet Take 1,000 Units by mouth daily.      . cyclobenzaprine (FLEXERIL) 5 MG tablet TAKE 1 TABLET THREE TIMES DAILY AS NEEDED FOR MUSCLE SPASMS. 90 tablet 0  . Galcanezumab-gnlm (EMGALITY) 120 MG/ML SOAJ Inject 120 mg into the skin every 30 (thirty) days. For migraine prophylaxis 1 pen 11  . ibuprofen (IBU) 600 MG tablet Take 1 tablet (600 mg total) by mouth 3 (three) times daily. 90 tablet 1  . Multiple Vitamin (MULTIVITAMIN PO) Take 1 tablet by mouth daily.      . nortriptyline (PAMELOR) 10 MG capsule Take 2 capsules (20 mg total) by mouth at bedtime. 60 capsule 11  . ondansetron (ZOFRAN) 8 MG tablet TAKE 1 TABLET EVERY 12 HOURS IF NEEDED FOR NAUSEA. 20 tablet 11  . OVER THE COUNTER MEDICATION Apply 1 application topically daily as needed (spinal pain). CBD Cream    . Oxycodone HCl 10 MG TABS Take 1 tablet (10 mg total) by mouth 4 (four) times daily as needed. (Patient taking differently: Take 10 mg by mouth 3 (three) times daily. ) 120 each 0  . Riboflavin (VITAMIN B-2 PO) Take 400 mg by mouth 2 (two) times daily.    . rizatriptan (MAXALT-MLT) 10 MG disintegrating tablet Take 1 tablet (10 mg total) by mouth as needed. May repeat in 2 hours if needed 9 tablet 6  . SUMAtriptan (IMITREX) 100 MG tablet Take 100 mg by mouth every 2 (two) hours as needed.   10  . Topiramate ER (TROKENDI XR) 200 MG CP24 Take 200 mg by mouth at bedtime. Please notice dosage change, now only 200mg  at night (Patient taking differently: Take 200 mg by mouth 2 (two) times daily. ) 90 capsule 4  . verapamil (CALAN-SR) 120 MG CR tablet TAKE ONE TABLET AT BEDTIME. 30 tablet 10  . estradiol (CLIMARA - DOSED IN  MG/24 HR) 0.05 mg/24hr patch Place 1 patch (0.05 mg total) onto the skin once a week. 12 patch 4  . predniSONE (DELTASONE) 50 MG tablet 1 tablet 6:30 PM 05/06/18 , 1 tablet 12:30 AM 05/07/18, 1 tablet AND  Benadryl 50 mg prior to leaving for hospital  05/07/18. 3 tablet 0   No facility-administered medications prior to visit.     ROS: Review of Systems  Constitutional: Positive for fatigue. Negative for activity change, appetite change, chills and unexpected weight change.  HENT: Negative for congestion, mouth sores and sinus pressure.   Eyes: Negative for visual disturbance.  Respiratory: Negative for cough and chest tightness.   Gastrointestinal: Negative for abdominal pain and nausea.  Genitourinary: Negative for difficulty urinating, frequency and vaginal pain.  Musculoskeletal: Positive for arthralgias, back pain, gait problem and joint swelling.  Skin: Negative for pallor and rash.  Neurological: Positive for numbness and headaches. Negative for dizziness, tremors and weakness.  Psychiatric/Behavioral: Negative for confusion and sleep disturbance.    Objective:  BP 126/82 (BP Location: Left Arm, Patient Position: Sitting, Cuff Size: Normal)   Pulse 74   Temp 98.4 F (36.9 C) (Oral)   Ht 5\' 5"  (1.651 m)   Wt 170 lb (77.1 kg)   SpO2 98%   BMI  28.29 kg/m   BP Readings from Last 3 Encounters:  08/03/18 126/82  05/07/18 112/74  05/05/18 (!) 148/82    Wt Readings from Last 3 Encounters:  08/03/18 170 lb (77.1 kg)  05/07/18 158 lb (71.7 kg)  05/05/18 172 lb (78 kg)    Physical Exam  Constitutional: She appears well-developed. No distress.  HENT:  Head: Normocephalic.  Right Ear: External ear normal.  Left Ear: External ear normal.  Nose: Nose normal.  Mouth/Throat: Oropharynx is clear and moist.  Eyes: Pupils are equal, round, and reactive to light. Conjunctivae are normal. Right eye exhibits no discharge. Left eye exhibits no discharge.  Neck: Normal range of motion.  Neck supple. No JVD present. No tracheal deviation present. No thyromegaly present.  Cardiovascular: Normal rate, regular rhythm and normal heart sounds.  Pulmonary/Chest: No stridor. No respiratory distress. She has no wheezes.  Abdominal: Soft. Bowel sounds are normal. She exhibits no distension and no mass. There is no tenderness. There is no rebound and no guarding.  Musculoskeletal: She exhibits tenderness. She exhibits no edema.  Lymphadenopathy:    She has no cervical adenopathy.  Neurological: She displays normal reflexes. No cranial nerve deficit. She exhibits normal muscle tone. Coordination abnormal.  Skin: No rash noted. No erythema.  Psychiatric: She has a normal mood and affect. Her behavior is normal. Judgment and thought content normal.  LS tender L knee painful  Lab Results  Component Value Date   WBC 7.5 04/28/2018   HGB 12.7 04/28/2018   HCT 40.2 04/28/2018   PLT 257 04/28/2018   GLUCOSE 92 04/28/2018   CHOL 189 05/04/2017   TRIG 129.0 05/04/2017   HDL 52.40 05/04/2017   LDLDIRECT 130.2 12/21/2013   LDLCALC 111 (H) 05/04/2017   ALT 52 (H) 03/31/2018   AST 36 03/31/2018   NA 145 (H) 04/28/2018   K 4.2 04/28/2018   CL 106 04/28/2018   CREATININE 0.98 04/28/2018   BUN 20 04/28/2018   CO2 23 04/28/2018   TSH 1.26 03/31/2018   HGBA1C 5.5 03/31/2018    No results found.  Assessment & Plan:   There are no diagnoses linked to this encounter.   No orders of the defined types were placed in this encounter.    Follow-up: No follow-ups on file.  Walker Kehr, MD

## 2018-08-03 NOTE — Telephone Encounter (Signed)
I called a refill request into [facility] [phone].

## 2018-08-08 NOTE — Telephone Encounter (Signed)
Noted  

## 2018-08-11 ENCOUNTER — Ambulatory Visit: Payer: BLUE CROSS/BLUE SHIELD | Admitting: Neurology

## 2018-08-12 ENCOUNTER — Telehealth: Payer: Self-pay | Admitting: Neurology

## 2018-08-12 ENCOUNTER — Other Ambulatory Visit: Payer: Self-pay | Admitting: Internal Medicine

## 2018-08-12 NOTE — Telephone Encounter (Signed)
Hanna@ [facility] has called to schedule delivery of pt's Botox she is asking that they be called back at [phone]

## 2018-08-13 DIAGNOSIS — G43719 Chronic migraine without aura, intractable, without status migrainosus: Secondary | ICD-10-CM | POA: Diagnosis not present

## 2018-08-13 NOTE — Telephone Encounter (Signed)
Patient has not had medication filled since 2017, please advise

## 2018-08-13 NOTE — Telephone Encounter (Signed)
I called and scheduled delivery for 08/17/18. DW

## 2018-08-15 ENCOUNTER — Other Ambulatory Visit: Payer: Self-pay | Admitting: Internal Medicine

## 2018-08-23 DIAGNOSIS — M1712 Unilateral primary osteoarthritis, left knee: Secondary | ICD-10-CM | POA: Diagnosis not present

## 2018-08-23 DIAGNOSIS — M170 Bilateral primary osteoarthritis of knee: Secondary | ICD-10-CM | POA: Diagnosis present

## 2018-08-23 NOTE — H&P (Addendum)
KNEE ARTHROPLASTY ADMISSION H&P  Patient ID: Beth Huffman MRN: [id number] DOB/AGE: [birth year] 57 y.o.  Chief Complaint: left knee pain.  Planned Procedure Date: 09/07/18 Medical Clearance by Dr. Alain Marion Cardiac Clearance by Dr. Caryl Comes Additional clearance by Neurology: Evlyn Courier, NP  HPI: Beth Huffman is a 57 y.o. female with a history of syringomyelia and arachnoid cyst now with chronic HA and back pain, GERD, HTN, and OCD.  She presents for evaluation of OA LEFT KNEE. The patient has a history of pain and functional disability in the left knee due to arthritis and has failed non-surgical conservative treatments for greater than 12 weeks to include NSAID's and/or analgesics, corticosteriod injections and activity modification.  Onset of symptoms was gradual, starting 1 years ago with gradually worsening course since that time.  Patient currently rates pain at 8 out of 10 with activity. Patient has night pain, worsening of pain with activity and weight bearing and pain that interferes with activities of daily living.  Patient has evidence of subchondral cysts, subchondral sclerosis, periarticular osteophytes and joint space narrowing by imaging studies.  There is no active infection.  Past Medical History:  Diagnosis Date  . Abnormal weight gain   . Acute sinusitis 12/05/2008   1/18 8/18  . Arachnoid cyst 08/19/2011  . Chest tightness or pressure 05/07/2018  . Constipation 09/08/2012   Chronic - opioid related 11/17 [place]  . Contact dermatitis 03/16/2013   5/14 relapsing - poison ivy   . Dyspnea 05/07/2018  . Elevated liver enzymes 03/04/2011   Chronic off and on   . Female stress incontinence 02/06/2009   Qualifier: Diagnosis of  By: Plotnikov MD, Evie Lacks   . FEVER UNSPECIFIED 09/29/2007   Qualifier: Diagnosis of  By: Plotnikov MD, Evie Lacks   . GERD 09/29/2007   Chronic  Protonix prn  Potential benefits of a long term PPI use as well as potential risks  and complications  were explained to the patient and were aknowledged.    Marland Kitchen GERD (gastroesophageal reflux disease)   . Headache 12/20/2007   Dr Hassell Done Migraines (1 per 2 wks) Topamax 400 mg/d; Imitrex prn; Ibuprofen prn, Zofran prn   . Hyperglycemia 08/19/2011   Mild    . Hypertension   . Increased blood pressure (not hypertension) 08/23/2014  . Intractable migraine 06/14/2009   Chronic Ibuprofen, Imitrex, Topamax,  Nortriptyline - intolerant  . Knee pain 03/04/2011   R knee 2018 L knee pain - L knee b anserina and L knee is tender w/ROM: L knee pain is worse - patellofemoral syndrome vs other  . LBP (low back pain)   . LOW BACK PAIN 09/29/2007   Chronic, lately (2016) disabling Due to syringomyelia On Oxycodone  Potential benefits of a long term opioids use as well as potential risks (i.e. addiction risk, apnea etc) and complications (i.e. Somnolence, constipation and others) were explained to the patient and were aknowledged.    . LUQ PAIN 05/23/2010   Qualifier: Diagnosis of  By: Plotnikov MD, Evie Lacks   . Migraine, chronic, without aura, intractable 10/28/2017   Botox approved diagnosis. 7/19 Worse. Start Emagility inj. Relpax  . Migraines   . Nausea & vomiting 08/19/2011  . Nausea without vomiting 06/14/2009   Due to migraines    . NECK PAIN 06/14/2009    Chronic, lately (2016) disabling Due to syringomyelia On Oxycodone  Potential benefits of a long term opioids use as well as potential risks (i.e. addiction risk, apnea etc) and complications (  i.e. Somnolence, constipation and others) were explained to the patient and were aknowledged.    Marland Kitchen NONSPECIFIC ABNORMAL RESULTS LIVR FUNCTION STUDY 09/28/2009   Qualifier: Diagnosis of  By: Plotnikov MD, [place] 05/23/2010   Chronic - compulsive shopping   . Onychomycosis 01/30/2016   4/17   . Orthostatic hypotension 06/30/2013   9/14 likely Rapaflo induced   . OTITIS EXTERNA 05/10/2008   Qualifier: Diagnosis of  By: Plotnikov MD,  Evie Lacks   . Palpitations 06/21/2014  . PARESTHESIA 07/13/2008   Qualifier: Diagnosis of  By: Plotnikov MD, Evie Lacks   . Partial thickness burn of left wrist 09/11/2015  . Postoperative state 07/24/2016  . Pyelonephritis 2008  . Rash 07/27/2017  . Stress 2009  . Syncope, near 06/30/2013   9/14 likely Rapaflo induced 12/14 relapsing off Rapaflo x 7 2/15 no relapsing since on 1/2 dose of Topamax   . Syringobulbia ([place])   . SYRINGOMYELIA 08/20/2007   Chronic Dr Krista Blue Dr [facility]ene Mullet at [facility] Chronic pain   . Syringomyelia ([place])   . Tachycardia   . Tonsillitis, chronic 02/26/2017   2018 worse  . URINARY TRACT INFECTION (UTI) 03/23/2008   Qualifier: Diagnosis of  By: Plotnikov MD, Evie Lacks   . UTI (lower urinary tract infection)   . Well adult exam 12/21/2013   We discussed age appropriate health related issues, including available/recomended screening tests and vaccinations. We discussed a need for adhering to healthy diet and exercise. Labs/EKG were reviewed/ordered. All questions were answered.      Past Surgical History:  Procedure Laterality Date  . ABDOMINAL HYSTERECTOMY    . ANTERIOR AND POSTERIOR REPAIR N/A 07/24/2016   Procedure: Possible ANTERIOR (CYSTOCELE) repair, perineoplasty;  Surgeon: Princess Bruins, MD;  Location: [facility];  Service: Gynecology;  Laterality: N/A;  . BLADDER SUSPENSION N/A 07/24/2016   Procedure: TRANSVAGINAL TAPE (TVT) PROCEDURE;  Surgeon: Princess Bruins, MD;  Location: [facility];  Service: Gynecology;  Laterality: N/A;  . COLONOSCOPY    . CYSTOSCOPY N/A 07/24/2016   Procedure: CYSTOSCOPY;  Surgeon: Princess Bruins, MD;  Location: [facility];  Service: Gynecology;  Laterality: N/A;  . intracranial decompression surgery  2006  . LEFT HEART CATH AND CORONARY ANGIOGRAPHY N/A 05/07/2018   Procedure: LEFT HEART CATH AND CORONARY ANGIOGRAPHY;  Surgeon: Belva Crome, MD;  Location: [facility];  Service: Cardiovascular;  Laterality: N/A;  . ROBOTIC ASSISTED  LAPAROSCOPIC SACROCOLPOPEXY N/A 07/24/2016   Procedure: ROBOTIC ASSISTED LAPAROSCOPIC SACROCOLPOPEXY WITH PERINEOPLASTY;  Surgeon: Princess Bruins, MD;  Location: [facility];  Service: Gynecology;  Laterality: N/A;  . TUBAL LIGATION     Allergies  Allergen Reactions  . Atenolol     Wt gain  . Cefuroxime Axetil     REACTION: nausea - but tolerates PCN  . Codeine Sulfate Nausea And Vomiting and Nausea Only  . Fentanyl Nausea And Vomiting    REACTION: bad reaction  . Nitrofurantoin   . Pregabalin     REACTION: cp  . Rapaflo [Silodosin]     Orthostatic BP drop, near syncope   . Tramadol Hcl Nausea And Vomiting    REACTION: sick  . Iodinated Diagnostic Agents Rash   Prior to Admission medications   Medication Sig Start Date End Date Taking? Authorizing Provider  cholecalciferol (VITAMIN D) 1000 UNITS tablet Take 1,000 Units by mouth daily.      [provider]  cyclobenzaprine (FLEXERIL) 5 MG tablet TAKE 1 TABLET THREE TIMES DAILY AS  NEEDED FOR MUSCLE SPASMS. 08/03/18   Plotnikov, Evie Lacks, MD  Galcanezumab-gnlm (EMGALITY) 120 MG/ML SOAJ Inject 120 mg into the skin every 30 (thirty) days. For migraine prophylaxis 04/28/18   Plotnikov, Evie Lacks, MD  ibuprofen (ADVIL,MOTRIN) 600 MG tablet TAKE 1 TABLET BY MOUTH 3 TIMES A DAY. 08/15/18   Plotnikov, Evie Lacks, MD  ibuprofen (IBU) 600 MG tablet Take 1 tablet (600 mg total) by mouth 3 (three) times daily. 01/26/18   Plotnikov, Evie Lacks, MD  Multiple Vitamin (MULTIVITAMIN PO) Take 1 tablet by mouth daily.      [provider]  nortriptyline (PAMELOR) 10 MG capsule Take 2 capsules (20 mg total) by mouth at bedtime. 10/28/17   Marcial Pacas, MD  ondansetron (ZOFRAN) 8 MG tablet TAKE 1 TABLET EVERY 12 HOURS IF NEEDED FOR NAUSEA. 10/28/17   Marcial Pacas, MD  OVER THE COUNTER MEDICATION Apply 1 application topically daily as needed (spinal pain). CBD Cream    [provider]  Oxycodone HCl 10 MG TABS Take 1 tablet (10 mg total) by mouth  4 (four) times daily as needed. 08/03/18   Plotnikov, Evie Lacks, MD  pantoprazole (PROTONIX) 40 MG tablet TAKE 1 TABLET EACH DAY. 08/15/18   Plotnikov, Evie Lacks, MD  Riboflavin (VITAMIN B-2 PO) Take 400 mg by mouth 2 (two) times daily.    [provider]  rizatriptan (MAXALT) 10 MG tablet Take 1 tablet (10 mg total) by mouth once as needed for up to 1 dose for migraine. May repeat in 2 hours if needed 08/03/18   Plotnikov, Evie Lacks, MD  topiramate (TOPAMAX) 200 MG tablet TAKE 1 TABLET BY MOUTH TWICE DAILY. 08/15/18   Plotnikov, Evie Lacks, MD  Topiramate ER (TROKENDI XR) 200 MG CP24 Take 200 mg by mouth at bedtime. Please notice dosage change, now only 200mg  at night Patient taking differently: Take 200 mg by mouth 2 (two) times daily.  01/27/18   Marcial Pacas, MD  verapamil (CALAN-SR) 120 MG CR tablet TAKE ONE TABLET AT BEDTIME. 06/15/18   Deboraha Sprang, MD   Social History   Socioeconomic History  . Marital status: Married    Spouse name: Not on file  . Number of children: 2  . Years of education: College  . Highest education level: Not on file  Occupational History  . Occupation: Office manager: SELF-EMPLOYED  Social Needs  . Financial resource strain: Not on file  . Food insecurity:    Worry: Not on file    Inability: Not on file  . Transportation needs:    Medical: Not on file    Non-medical: Not on file  Tobacco Use  . Smoking status: Never Smoker  . Smokeless tobacco: Never Used  Substance and Sexual Activity  . Alcohol use: No  . Drug use: No  . Sexual activity: Yes  Lifestyle  . Physical activity:    Days per week: Not on file    Minutes per session: Not on file  . Stress: Not on file  Relationships  . Social connections:    Talks on phone: Not on file    Gets together: Not on file    Attends religious service: Not on file    Active member of club or organization: Not on file    Attends meetings of clubs or organizations: Not on file     Relationship status: Not on file  Other Topics Concern  . Not on file  Social History Narrative  Lives at home with her husband.   Right-handed.   1 cup caffeine per day.   Family History  Problem Relation Age of Onset  . COPD Mother   . Peripheral vascular disease Mother   . Hypertension Mother   . Heart disease Mother   . Hypertension Father   . Anxiety disorder Other   . Heart attack Maternal Grandmother   . Stroke Maternal Grandfather   . Stroke Paternal Grandfather   . Colon cancer Maternal Uncle 91  . Breast cancer Maternal Aunt     ROS: Currently denies lightheadedness, Fever, chills, CP, SOB.   No personal history of DVT, PE, MI, or CVA. No loose teeth or dentures All other systems have been reviewed and were otherwise currently negative with the exception of those mentioned in the HPI and as above.  Objective: Vitals: Ht: 5'5" Wt: 182 Temp: 97.4 BP: 128/83 Pulse: 84 O2 98% on room air.   Physical Exam: General: Alert, NAD.  Antalgic Gait  HEENT: EOMI, Good Neck Extension  Pulm: No increased work of breathing.  Clear B/L A/P w/o crackle or wheeze.  CV: RRR, No m/g/r appreciated  GI: soft, NT, ND Neuro: Neuro without gross focal deficit.  Sensation intact distally Skin: No lesions in the area of chief complaint MSK/Surgical Site: Left knee w/o redness or effusion.  Medial JLT. ROM 5-115.  5/5 strength in extension and flexion.  +EHL/FHL.  NVI.  Stable varus and valgus stress.    Imaging Review Plain radiographs demonstrate severe degenerative joint disease of the left knee.   Preoperative templating of the joint replacement has been completed, documented, and submitted to the Operating Room personnel in order to optimize intra-operative equipment management.  Assessment: OA LEFT KNEE Principal Problem:   Primary osteoarthritis of left knee Active Problems:   SYRINGOMYELIA   GERD   LOW BACK PAIN   Migraine, chronic, without aura,  intractable   Plan: Plan for Procedure(s): UNICOMPARTMENTAL LEFT KNEE  The patient history, physical exam, clinical judgement of the provider and imaging are consistent with end stage degenerative joint disease and unicompartmental joint arthroplasty is deemed medically necessary. The treatment options including medical management, injection therapy, and arthroplasty were discussed at length. The risks and benefits of Procedure(s): UNICOMPARTMENTAL LEFT KNEE were presented and reviewed.  The risks of nonoperative treatment, versus surgical intervention including but not limited to continued pain, aseptic loosening, stiffness, dislocation/subluxation, infection, bleeding, nerve injury, blood clots, cardiopulmonary complications, morbidity, mortality, among others were discussed. The patient verbalizes understanding and wishes to proceed with the plan.  Patient is being admitted for inpatient treatment for surgery, pain control, PT, OT, prophylactic antibiotics, VTE prophylaxis, progressive ambulation, ADL's and discharge planning.   Dental prophylaxis discussed and recommended for 2 years postoperatively.   The patient does meet the criteria for TXA which will be used perioperatively.    ASA 81 mg BID will be used postoperatively for DVT prophylaxis in addition to SCDs, and early ambulation.  Continue Flexeril, Pantoprazole and Ibuprofen  Plan to prescribe Oxycodone for breakthough pain.  Also make Rx for Tylenol and Gabapentin.  The patient is planning to be discharged home with OPPT in care of her husband.   Patient's anticipated LOS is less than 2 midnights, meeting these requirements: - Younger than 81 - Lives within 1 hour of care - Has a competent adult at home to recover with post-op recover - NO history of  - Diabetes  - Coronary Artery Disease  - Heart failure  -  Heart attack  - Stroke  - DVT/VTE  - Cardiac arrhythmia  - Respiratory Failure/COPD  - Renal failure  -  Anemia  - Advanced Liver disease   Prudencio Burly III, PA-C 08/23/2018 9:31 AM

## 2018-08-27 NOTE — Patient Instructions (Addendum)
Beth Huffman  08/27/2018   Your procedure is scheduled on: 09-07-18     Report to [facility] Main  Entrance    Report to Admitting at 7:30 AM    Call this number if you have problems the morning of surgery [phone]     Remember: Do not eat food or drink liquids :After Midnight.    BRUSH YOUR TEETH MORNING OF SURGERY AND RINSE YOUR MOUTH OUT, NO CHEWING GUM CANDY OR MINTS.     Take these medicines the morning of surgery with A SIP OF WATER: Pantoprazole (Protonix), and Oxycodone HCL                                You may not have any metal on your body including hair pins and              piercings  Do not wear jewelry, make-up, lotions, powders or perfumes, deodorant             Do not wear nail polish.  Do not shave  48 hours prior to surgery.                Do not bring valuables to the hospital. [place].  Contacts, dentures or bridgework may not be worn into surgery.  Leave suitcase in the car. After surgery it may be brought to your room.      Special Instructions: N/A              Please read over the following fact sheets you were given: _____________________________________________________________________             [facility] - Preparing for Surgery Before surgery, you can play an important role.  Because skin is not sterile, your skin needs to be as free of germs as possible.  You can reduce the number of germs on your skin by washing with CHG (chlorahexidine gluconate) soap before surgery.  CHG is an antiseptic cleaner which kills germs and bonds with the skin to continue killing germs even after washing. Please DO NOT use if you have an allergy to CHG or antibacterial soaps.  If your skin becomes reddened/irritated stop using the CHG and inform your nurse when you arrive at Short Stay. Do not shave (including legs and underarms) for at least 48 hours prior to the first CHG  shower.  You may shave your face/neck. Please follow these instructions carefully:  1.  Shower with CHG Soap the night before surgery and the  morning of Surgery.  2.  If you choose to wash your hair, wash your hair first as usual with your  normal  shampoo.  3.  After you shampoo, rinse your hair and body thoroughly to remove the  shampoo.                           4.  Use CHG as you would any other liquid soap.  You can apply chg directly  to the skin and wash                       Gently with a scrungie or clean washcloth.  5.  Apply the CHG Soap to your body ONLY FROM THE NECK DOWN.   Do not use on face/ open                           Wound or open sores. Avoid contact with eyes, ears mouth and genitals (private parts).                       Wash face,  Genitals (private parts) with your normal soap.             6.  Wash thoroughly, paying special attention to the area where your surgery  will be performed.  7.  Thoroughly rinse your body with warm water from the neck down.  8.  DO NOT shower/wash with your normal soap after using and rinsing off  the CHG Soap.                9.  Pat yourself dry with a clean towel.            10.  Wear clean pajamas.            11.  Place clean sheets on your bed the night of your first shower and do not  sleep with pets. Day of Surgery : Do not apply any lotions/deodorants the morning of surgery.  Please wear clean clothes to the hospital/surgery center.  FAILURE TO FOLLOW THESE INSTRUCTIONS MAY RESULT IN THE CANCELLATION OF YOUR SURGERY PATIENT SIGNATURE_________________________________  NURSE SIGNATURE__________________________________  ________________________________________________________________________   Adam Phenix  An incentive spirometer is a tool that can help keep your lungs clear and active. This tool measures how well you are filling your lungs with each breath. Taking long deep breaths may help reverse or decrease the chance  of developing breathing (pulmonary) problems (especially infection) following:  A long period of time when you are unable to move or be active. BEFORE THE PROCEDURE   If the spirometer includes an indicator to show your best effort, your nurse or respiratory therapist will set it to a desired goal.  If possible, sit up straight or lean slightly forward. Try not to slouch.  Hold the incentive spirometer in an upright position. INSTRUCTIONS FOR USE  1. Sit on the edge of your bed if possible, or sit up as far as you can in bed or on a chair. 2. Hold the incentive spirometer in an upright position. 3. Breathe out normally. 4. Place the mouthpiece in your mouth and seal your lips tightly around it. 5. Breathe in slowly and as deeply as possible, raising the piston or the ball toward the top of the column. 6. Hold your breath for 3-5 seconds or for as long as possible. Allow the piston or ball to fall to the bottom of the column. 7. Remove the mouthpiece from your mouth and breathe out normally. 8. Rest for a few seconds and repeat Steps 1 through 7 at least 10 times every 1-2 hours when you are awake. Take your time and take a few normal breaths between deep breaths. 9. The spirometer may include an indicator to show your best effort. Use the indicator as a goal to work toward during each repetition. 10. After each set of 10 deep breaths, practice coughing to be sure your lungs are clear. If you have an incision (the cut made at the time of surgery), support your incision when coughing by placing a pillow or  rolled up towels firmly against it. Once you are able to get out of bed, walk around indoors and cough well. You may stop using the incentive spirometer when instructed by your caregiver.  RISKS AND COMPLICATIONS  Take your time so you do not get dizzy or light-headed.  If you are in pain, you may need to take or ask for pain medication before doing incentive spirometry. It is harder to  take a deep breath if you are having pain. AFTER USE  Rest and breathe slowly and easily.  It can be helpful to keep track of a log of your progress. Your caregiver can provide you with a simple table to help with this. If you are using the spirometer at home, follow these instructions: [place] IF:   You are having difficultly using the spirometer.  You have trouble using the spirometer as often as instructed.  Your pain medication is not giving enough relief while using the spirometer.  You develop fever of 100.5 F (38.1 C) or higher. SEEK IMMEDIATE MEDICAL CARE IF:   You cough up bloody sputum that had not been present before.  You develop fever of 102 F (38.9 C) or greater.  You develop worsening pain at or near the incision site. MAKE SURE YOU:   Understand these instructions.  Will watch your condition.  Will get help right away if you are not doing well or get worse. Document Released: 02/23/2007 Document Revised: 01/05/2012 Document Reviewed: 04/26/2007 ExitCare Patient Information 2014 Exit[employer], [place].   ________________________________________________________________________  WHAT IS A BLOOD TRANSFUSION? Blood Transfusion Information  A transfusion is the replacement of blood or some of its parts. Blood is made up of multiple cells which provide different functions.  Red blood cells carry oxygen and are used for blood loss replacement.  White blood cells fight against infection.  Platelets control bleeding.  Plasma helps clot blood.  Other blood products are available for specialized needs, such as hemophilia or other clotting disorders. BEFORE THE TRANSFUSION  Who gives blood for transfusions?   Healthy volunteers who are fully evaluated to make sure their blood is safe. This is blood bank blood. Transfusion therapy is the safest it has ever been in the practice of medicine. Before blood is taken from a donor, a complete history is taken to  make sure that person has no history of diseases nor engages in risky social behavior (examples are intravenous drug use or sexual activity with multiple partners). The donor's travel history is screened to minimize risk of transmitting infections, such as malaria. The donated blood is tested for signs of infectious diseases, such as HIV and hepatitis. The blood is then tested to be sure it is compatible with you in order to minimize the chance of a transfusion reaction. If you or a relative donates blood, this is often done in anticipation of surgery and is not appropriate for emergency situations. It takes many days to process the donated blood. RISKS AND COMPLICATIONS Although transfusion therapy is very safe and saves many lives, the main dangers of transfusion include:   Getting an infectious disease.  Developing a transfusion reaction. This is an allergic reaction to something in the blood you were given. Every precaution is taken to prevent this. The decision to have a blood transfusion has been considered carefully by your caregiver before blood is given. Blood is not given unless the benefits outweigh the risks. AFTER THE TRANSFUSION  Right after receiving a blood transfusion, you will usually  feel much better and more energetic. This is especially true if your red blood cells have gotten low (anemic). The transfusion raises the level of the red blood cells which carry oxygen, and this usually causes an energy increase.  The nurse administering the transfusion will monitor you carefully for complications. HOME CARE INSTRUCTIONS  No special instructions are needed after a transfusion. You may find your energy is better. Speak with your caregiver about any limitations on activity for underlying diseases you may have. SEEK MEDICAL CARE IF:   Your condition is not improving after your transfusion.  You develop redness or irritation at the intravenous (IV) site. SEEK IMMEDIATE MEDICAL CARE  IF:  Any of the following symptoms occur over the next 12 hours:  Shaking chills.  You have a temperature by mouth above 102 F (38.9 C), not controlled by medicine.  Chest, back, or muscle pain.  People around you feel you are not acting correctly or are confused.  Shortness of breath or difficulty breathing.  Dizziness and fainting.  You get a rash or develop hives.  You have a decrease in urine output.  Your urine turns a dark color or changes to pink, red, or brown. Any of the following symptoms occur over the next 10 days:  You have a temperature by mouth above 102 F (38.9 C), not controlled by medicine.  Shortness of breath.  Weakness after normal activity.  The white part of the eye turns yellow (jaundice).  You have a decrease in the amount of urine or are urinating less often.  Your urine turns a dark color or changes to pink, red, or brown. Document Released: 10/10/2000 Document Revised: 01/05/2012 Document Reviewed: 05/29/2008 [facility] Patient Information 2014 [place], [place].  _______________________________________________________________________

## 2018-08-27 NOTE — Progress Notes (Signed)
07-22-18 (Epic) Cardiac Clearance from Dr. Caryl Comes in Telephone Encounter  05-03-18 (Epic) ECHO  04-30-18 (Epic) EKG,

## 2018-08-31 ENCOUNTER — Encounter (HOSPITAL_COMMUNITY)
Admission: RE | Admit: 2018-08-31 | Discharge: 2018-08-31 | Disposition: A | Discharge status: Home / Self Care | Payer: BLUE CROSS/BLUE SHIELD | Source: Ambulatory Visit | Attending: Orthopedic Surgery | Admitting: Orthopedic Surgery

## 2018-08-31 ENCOUNTER — Other Ambulatory Visit: Payer: Self-pay

## 2018-08-31 ENCOUNTER — Encounter (HOSPITAL_COMMUNITY): Payer: Self-pay

## 2018-08-31 DIAGNOSIS — Z01812 Encounter for preprocedural laboratory examination: Secondary | ICD-10-CM | POA: Insufficient documentation

## 2018-08-31 LAB — SURGICAL PCR SCREEN
MRSA, PCR: NEGATIVE
STAPHYLOCOCCUS AUREUS: NEGATIVE

## 2018-08-31 LAB — BASIC METABOLIC PANEL
ANION GAP: 7 (ref 5–15)
BUN: 16 mg/dL (ref 6–20)
CALCIUM: 9.7 mg/dL (ref 8.9–10.3)
CO2: 24 mmol/L (ref 22–32)
Chloride: 111 mmol/L (ref 98–111)
Creatinine, Ser: 0.98 mg/dL (ref 0.44–1.00)
GFR calc Af Amer: >60 mL/min (ref >60)
GFR calc non Af Amer: >60 mL/min (ref >60)
GLUCOSE: 87 mg/dL (ref 70–99)
Potassium: 3.8 mmol/L (ref 3.5–5.1)
SODIUM: 142 mmol/L (ref 135–145)

## 2018-08-31 LAB — CBC
HCT: 41.3 % (ref 36.0–46.0)
Hemoglobin: 13.2 g/dL (ref 12.0–15.0)
MCH: 30.2 pg (ref 26.0–34.0)
MCHC: 32 g/dL (ref 30.0–36.0)
MCV: 94.5 fL (ref 80.0–100.0)
PLATELETS: 213 10*3/uL (ref 150–400)
RBC: 4.37 MIL/uL (ref 3.87–5.11)
RDW: 13 % (ref 11.5–15.5)
WBC: 4.1 10*3/uL (ref 4.0–10.5)
nRBC: 0 % (ref 0.0–0.2)

## 2018-09-02 ENCOUNTER — Other Ambulatory Visit: Payer: Self-pay | Admitting: Obstetrics & Gynecology

## 2018-09-02 DIAGNOSIS — Z1231 Encounter for screening mammogram for malignant neoplasm of breast: Secondary | ICD-10-CM

## 2018-09-06 MED ORDER — BUPIVACAINE LIPOSOME 1.3 % IJ SUSP
20.0000 mL | INTRAMUSCULAR | Status: DC
  Filled 2018-09-06: qty 20

## 2018-09-07 ENCOUNTER — Ambulatory Visit (HOSPITAL_COMMUNITY): Payer: BLUE CROSS/BLUE SHIELD | Admitting: Certified Registered Nurse Anesthetist

## 2018-09-07 ENCOUNTER — Encounter (HOSPITAL_COMMUNITY)
Admission: RE | Disposition: A | Discharge status: Home / Self Care | Payer: Self-pay | Source: Ambulatory Visit | Attending: Orthopedic Surgery

## 2018-09-07 ENCOUNTER — Observation Stay (HOSPITAL_COMMUNITY): Payer: BLUE CROSS/BLUE SHIELD

## 2018-09-07 ENCOUNTER — Observation Stay (HOSPITAL_COMMUNITY)
Admission: RE | Admit: 2018-09-07 | Discharge: 2018-09-08 | Disposition: A | Discharge status: Home / Self Care | Payer: BLUE CROSS/BLUE SHIELD | Source: Ambulatory Visit | Attending: Orthopedic Surgery | Admitting: Orthopedic Surgery

## 2018-09-07 ENCOUNTER — Other Ambulatory Visit: Payer: Self-pay

## 2018-09-07 ENCOUNTER — Encounter (HOSPITAL_COMMUNITY): Payer: Self-pay | Admitting: *Deleted

## 2018-09-07 DIAGNOSIS — R002 Palpitations: Secondary | ICD-10-CM | POA: Diagnosis not present

## 2018-09-07 DIAGNOSIS — R202 Paresthesia of skin: Secondary | ICD-10-CM | POA: Insufficient documentation

## 2018-09-07 DIAGNOSIS — Z90710 Acquired absence of both cervix and uterus: Secondary | ICD-10-CM | POA: Diagnosis not present

## 2018-09-07 DIAGNOSIS — M1990 Unspecified osteoarthritis, unspecified site: Secondary | ICD-10-CM | POA: Insufficient documentation

## 2018-09-07 DIAGNOSIS — F419 Anxiety disorder, unspecified: Secondary | ICD-10-CM | POA: Insufficient documentation

## 2018-09-07 DIAGNOSIS — M542 Cervicalgia: Secondary | ICD-10-CM | POA: Insufficient documentation

## 2018-09-07 DIAGNOSIS — Z471 Aftercare following joint replacement surgery: Secondary | ICD-10-CM | POA: Diagnosis not present

## 2018-09-07 DIAGNOSIS — M545 Low back pain, unspecified: Secondary | ICD-10-CM | POA: Diagnosis present

## 2018-09-07 DIAGNOSIS — R739 Hyperglycemia, unspecified: Secondary | ICD-10-CM | POA: Insufficient documentation

## 2018-09-07 DIAGNOSIS — M1712 Unilateral primary osteoarthritis, left knee: Secondary | ICD-10-CM | POA: Diagnosis not present

## 2018-09-07 DIAGNOSIS — K219 Gastro-esophageal reflux disease without esophagitis: Secondary | ICD-10-CM | POA: Diagnosis not present

## 2018-09-07 DIAGNOSIS — R112 Nausea with vomiting, unspecified: Secondary | ICD-10-CM | POA: Insufficient documentation

## 2018-09-07 DIAGNOSIS — Z885 Allergy status to narcotic agent status: Secondary | ICD-10-CM | POA: Diagnosis not present

## 2018-09-07 DIAGNOSIS — N393 Stress incontinence (female) (male): Secondary | ICD-10-CM | POA: Insufficient documentation

## 2018-09-07 DIAGNOSIS — E669 Obesity, unspecified: Secondary | ICD-10-CM | POA: Insufficient documentation

## 2018-09-07 DIAGNOSIS — Z888 Allergy status to other drugs, medicaments and biological substances status: Secondary | ICD-10-CM | POA: Insufficient documentation

## 2018-09-07 DIAGNOSIS — M5441 Lumbago with sciatica, right side: Secondary | ICD-10-CM

## 2018-09-07 DIAGNOSIS — G43909 Migraine, unspecified, not intractable, without status migrainosus: Secondary | ICD-10-CM | POA: Diagnosis not present

## 2018-09-07 DIAGNOSIS — K5900 Constipation, unspecified: Secondary | ICD-10-CM | POA: Insufficient documentation

## 2018-09-07 DIAGNOSIS — F429 Obsessive-compulsive disorder, unspecified: Secondary | ICD-10-CM | POA: Diagnosis not present

## 2018-09-07 DIAGNOSIS — Z7982 Long term (current) use of aspirin: Secondary | ICD-10-CM | POA: Diagnosis not present

## 2018-09-07 DIAGNOSIS — Z881 Allergy status to other antibiotic agents status: Secondary | ICD-10-CM | POA: Insufficient documentation

## 2018-09-07 DIAGNOSIS — I10 Essential (primary) hypertension: Secondary | ICD-10-CM | POA: Diagnosis not present

## 2018-09-07 DIAGNOSIS — Z91041 Radiographic dye allergy status: Secondary | ICD-10-CM | POA: Insufficient documentation

## 2018-09-07 DIAGNOSIS — M5442 Lumbago with sciatica, left side: Secondary | ICD-10-CM

## 2018-09-07 DIAGNOSIS — R748 Abnormal levels of other serum enzymes: Secondary | ICD-10-CM | POA: Insufficient documentation

## 2018-09-07 DIAGNOSIS — M170 Bilateral primary osteoarthritis of knee: Secondary | ICD-10-CM | POA: Diagnosis present

## 2018-09-07 DIAGNOSIS — R1012 Left upper quadrant pain: Secondary | ICD-10-CM | POA: Diagnosis not present

## 2018-09-07 DIAGNOSIS — G8918 Other acute postprocedural pain: Secondary | ICD-10-CM | POA: Diagnosis not present

## 2018-09-07 DIAGNOSIS — Z79899 Other long term (current) drug therapy: Secondary | ICD-10-CM | POA: Diagnosis not present

## 2018-09-07 DIAGNOSIS — Z96652 Presence of left artificial knee joint: Secondary | ICD-10-CM | POA: Diagnosis not present

## 2018-09-07 DIAGNOSIS — G950 Syringomyelia and syringobulbia: Secondary | ICD-10-CM | POA: Diagnosis not present

## 2018-09-07 DIAGNOSIS — G43719 Chronic migraine without aura, intractable, without status migrainosus: Secondary | ICD-10-CM

## 2018-09-07 DIAGNOSIS — R0600 Dyspnea, unspecified: Secondary | ICD-10-CM | POA: Diagnosis not present

## 2018-09-07 DIAGNOSIS — Z96659 Presence of unspecified artificial knee joint: Secondary | ICD-10-CM

## 2018-09-07 DIAGNOSIS — Z6829 Body mass index (BMI) 29.0-29.9, adult: Secondary | ICD-10-CM | POA: Insufficient documentation

## 2018-09-07 DIAGNOSIS — G8929 Other chronic pain: Secondary | ICD-10-CM

## 2018-09-07 DIAGNOSIS — M25561 Pain in right knee: Secondary | ICD-10-CM | POA: Insufficient documentation

## 2018-09-07 DIAGNOSIS — G43109 Migraine with aura, not intractable, without status migrainosus: Secondary | ICD-10-CM | POA: Diagnosis present

## 2018-09-07 HISTORY — PX: PARTIAL KNEE ARTHROPLASTY: SHX2174

## 2018-09-07 SURGERY — ARTHROPLASTY, KNEE, UNICOMPARTMENTAL
Anesthesia: General | Site: Knee | Laterality: Left

## 2018-09-07 MED ORDER — DEXAMETHASONE SODIUM PHOSPHATE 10 MG/ML IJ SOLN
10.0000 mg | Freq: Once | INTRAMUSCULAR | Status: AC
  Administered 2018-09-08: 10 mg via INTRAVENOUS
  Filled 2018-09-07: qty 1

## 2018-09-07 MED ORDER — DIPHENHYDRAMINE HCL 12.5 MG/5ML PO ELIX: 12.5000 mg | ORAL_SOLUTION | ORAL | Status: DC | PRN

## 2018-09-07 MED ORDER — 0.9 % SODIUM CHLORIDE (POUR BTL) OPTIME
TOPICAL | Status: DC | PRN
  Administered 2018-09-07: 1000 mL

## 2018-09-07 MED ORDER — SODIUM CHLORIDE (PF) 0.9 % IJ SOLN
INTRAMUSCULAR | Status: AC
  Filled 2018-09-07: qty 50

## 2018-09-07 MED ORDER — TOPIRAMATE 100 MG PO TABS
200.0000 mg | ORAL_TABLET | Freq: Every day | ORAL | Status: DC
  Administered 2018-09-07: 200 mg via ORAL

## 2018-09-07 MED ORDER — ACETAMINOPHEN 500 MG PO TABS
1000.0000 mg | ORAL_TABLET | Freq: Three times a day (TID) | ORAL | Status: DC
  Administered 2018-09-07 – 2018-09-08 (×3): 1000 mg via ORAL
  Filled 2018-09-07 (×3): qty 2

## 2018-09-07 MED ORDER — OXYCODONE HCL 5 MG PO TABS
5.0000 mg | ORAL_TABLET | ORAL | Status: DC | PRN
  Administered 2018-09-07: 5 mg via ORAL
  Administered 2018-09-07 – 2018-09-08 (×2): 10 mg via ORAL
  Filled 2018-09-07 (×2): qty 2
  Filled 2018-09-07: qty 1

## 2018-09-07 MED ORDER — PANTOPRAZOLE SODIUM 40 MG PO TBEC
40.0000 mg | DELAYED_RELEASE_TABLET | Freq: Every day | ORAL | Status: DC
  Administered 2018-09-08: 40 mg via ORAL
  Filled 2018-09-07: qty 1

## 2018-09-07 MED ORDER — FENTANYL CITRATE (PF) 250 MCG/5ML IJ SOLN
INTRAMUSCULAR | Status: AC
  Filled 2018-09-07: qty 5

## 2018-09-07 MED ORDER — LACTATED RINGERS IV SOLN
INTRAVENOUS | Status: DC
  Administered 2018-09-07 – 2018-09-08 (×2): via INTRAVENOUS

## 2018-09-07 MED ORDER — STERILE WATER FOR IRRIGATION IR SOLN
Status: DC | PRN
  Administered 2018-09-07: 2000 mL

## 2018-09-07 MED ORDER — TRANEXAMIC ACID-NACL 1000-0.7 MG/100ML-% IV SOLN
1000.0000 mg | INTRAVENOUS | Status: AC
  Administered 2018-09-07: 1000 mg via INTRAVENOUS
  Filled 2018-09-07: qty 100

## 2018-09-07 MED ORDER — PROPOFOL 10 MG/ML IV BOLUS
INTRAVENOUS | Status: DC | PRN
  Administered 2018-09-07: 200 mg via INTRAVENOUS

## 2018-09-07 MED ORDER — LACTATED RINGERS IV SOLN
INTRAVENOUS | Status: DC
  Administered 2018-09-07 (×2): via INTRAVENOUS

## 2018-09-07 MED ORDER — SODIUM CHLORIDE 0.9 % IR SOLN
Status: DC | PRN
  Administered 2018-09-07: 3000 mL

## 2018-09-07 MED ORDER — ONDANSETRON HCL 4 MG/2ML IJ SOLN
4.0000 mg | Freq: Four times a day (QID) | INTRAMUSCULAR | Status: DC | PRN
  Administered 2018-09-07: 4 mg via INTRAVENOUS
  Filled 2018-09-07: qty 2

## 2018-09-07 MED ORDER — HYDROMORPHONE HCL 1 MG/ML IJ SOLN
INTRAMUSCULAR | Status: AC
  Filled 2018-09-07: qty 1

## 2018-09-07 MED ORDER — PHENOL 1.4 % MT LIQD: 1.0000 | OROMUCOSAL | Status: DC | PRN

## 2018-09-07 MED ORDER — HYDROMORPHONE HCL 1 MG/ML IJ SOLN: 0.5000 mg | INTRAMUSCULAR | Status: DC | PRN

## 2018-09-07 MED ORDER — VERAPAMIL HCL ER 120 MG PO TBCR
120.0000 mg | EXTENDED_RELEASE_TABLET | Freq: Every day | ORAL | Status: DC
  Administered 2018-09-07: 120 mg via ORAL
  Filled 2018-09-07: qty 1

## 2018-09-07 MED ORDER — MENTHOL 3 MG MT LOZG: 1.0000 | LOZENGE | OROMUCOSAL | Status: DC | PRN

## 2018-09-07 MED ORDER — SUMATRIPTAN SUCCINATE 50 MG PO TABS
100.0000 mg | ORAL_TABLET | ORAL | Status: DC | PRN
  Administered 2018-09-07 (×2): 100 mg via ORAL
  Filled 2018-09-07 (×4): qty 2

## 2018-09-07 MED ORDER — ACETAMINOPHEN 325 MG PO TABS: 325.0000 mg | ORAL_TABLET | Freq: Four times a day (QID) | ORAL | Status: DC | PRN

## 2018-09-07 MED ORDER — ACETAMINOPHEN 500 MG PO TABS: 1000.0000 mg | ORAL_TABLET | Freq: Three times a day (TID) | ORAL | 0 refills | Status: AC

## 2018-09-07 MED ORDER — METHOCARBAMOL 500 MG IVPB - SIMPLE MED
500.0000 mg | Freq: Once | INTRAVENOUS | Status: AC
  Administered 2018-09-07: 500 mg via INTRAVENOUS

## 2018-09-07 MED ORDER — DEXAMETHASONE SODIUM PHOSPHATE 10 MG/ML IJ SOLN
INTRAMUSCULAR | Status: DC | PRN
  Administered 2018-09-07: 10 mg via INTRAVENOUS

## 2018-09-07 MED ORDER — GABAPENTIN 300 MG PO CAPS
300.0000 mg | ORAL_CAPSULE | Freq: Once | ORAL | Status: AC
  Administered 2018-09-07: 300 mg via ORAL
  Filled 2018-09-07: qty 1

## 2018-09-07 MED ORDER — OXYCODONE HCL 5 MG PO TABS: 5.0000 mg | ORAL_TABLET | Freq: Once | ORAL | Status: DC | PRN

## 2018-09-07 MED ORDER — POVIDONE-IODINE 10 % EX SWAB: 2.0000 "application " | Freq: Once | CUTANEOUS | Status: DC

## 2018-09-07 MED ORDER — HYDROMORPHONE HCL 2 MG/ML IJ SOLN
INTRAMUSCULAR | Status: AC
  Filled 2018-09-07: qty 1

## 2018-09-07 MED ORDER — SODIUM CHLORIDE FLUSH 0.9 % IV SOLN
INTRAVENOUS | Status: DC | PRN
  Administered 2018-09-07: 30 mL

## 2018-09-07 MED ORDER — DEXAMETHASONE SODIUM PHOSPHATE 10 MG/ML IJ SOLN
INTRAMUSCULAR | Status: AC
  Filled 2018-09-07: qty 1

## 2018-09-07 MED ORDER — ASPIRIN 81 MG PO CHEW
81.0000 mg | CHEWABLE_TABLET | Freq: Two times a day (BID) | ORAL | Status: DC
  Administered 2018-09-07 – 2018-09-08 (×2): 81 mg via ORAL
  Filled 2018-09-07 (×2): qty 1

## 2018-09-07 MED ORDER — METOCLOPRAMIDE HCL 5 MG/ML IJ SOLN: 5.0000 mg | Freq: Three times a day (TID) | INTRAMUSCULAR | Status: DC | PRN

## 2018-09-07 MED ORDER — HYDROMORPHONE HCL 1 MG/ML IJ SOLN
0.2500 mg | INTRAMUSCULAR | Status: DC | PRN
  Administered 2018-09-07 (×2): 0.25 mg via INTRAVENOUS
  Administered 2018-09-07: 0.5 mg via INTRAVENOUS

## 2018-09-07 MED ORDER — GABAPENTIN 300 MG PO CAPS
300.0000 mg | ORAL_CAPSULE | Freq: Three times a day (TID) | ORAL | Status: DC
  Administered 2018-09-07 – 2018-09-08 (×3): 300 mg via ORAL
  Filled 2018-09-07 (×3): qty 1

## 2018-09-07 MED ORDER — GABAPENTIN 300 MG PO CAPS: 300.0000 mg | ORAL_CAPSULE | Freq: Two times a day (BID) | ORAL | 0 refills | Status: DC

## 2018-09-07 MED ORDER — CYCLOBENZAPRINE HCL 5 MG PO TABS
5.0000 mg | ORAL_TABLET | Freq: Three times a day (TID) | ORAL | Status: DC | PRN
  Administered 2018-09-07 – 2018-09-08 (×2): 5 mg via ORAL
  Filled 2018-09-07 (×2): qty 1

## 2018-09-07 MED ORDER — ONDANSETRON HCL 4 MG/2ML IJ SOLN
INTRAMUSCULAR | Status: AC
  Filled 2018-09-07: qty 2

## 2018-09-07 MED ORDER — PROMETHAZINE HCL 25 MG/ML IJ SOLN
6.2500 mg | INTRAMUSCULAR | Status: DC | PRN
  Administered 2018-09-07: 6.25 mg via INTRAVENOUS

## 2018-09-07 MED ORDER — PROMETHAZINE HCL 25 MG/ML IJ SOLN
INTRAMUSCULAR | Status: AC
  Filled 2018-09-07: qty 1

## 2018-09-07 MED ORDER — PROPOFOL 10 MG/ML IV BOLUS
INTRAVENOUS | Status: AC
  Filled 2018-09-07: qty 20

## 2018-09-07 MED ORDER — DOCUSATE SODIUM 100 MG PO CAPS
100.0000 mg | ORAL_CAPSULE | Freq: Two times a day (BID) | ORAL | Status: DC
  Administered 2018-09-07 – 2018-09-08 (×2): 100 mg via ORAL
  Filled 2018-09-07 (×2): qty 1

## 2018-09-07 MED ORDER — MAGNESIUM CITRATE PO SOLN: 1.0000 | Freq: Once | ORAL | Status: DC | PRN

## 2018-09-07 MED ORDER — HYDROMORPHONE HCL 1 MG/ML IJ SOLN
INTRAMUSCULAR | Status: DC | PRN
  Administered 2018-09-07 (×8): 0.5 mg via INTRAVENOUS

## 2018-09-07 MED ORDER — ASPIRIN EC 81 MG PO TBEC: 81.0000 mg | DELAYED_RELEASE_TABLET | Freq: Two times a day (BID) | ORAL | 0 refills | Status: DC

## 2018-09-07 MED ORDER — ROPIVACAINE HCL 7.5 MG/ML IJ SOLN
INTRAMUSCULAR | Status: DC | PRN
  Administered 2018-09-07: 20 mL via PERINEURAL

## 2018-09-07 MED ORDER — OXYCODONE HCL 5 MG/5ML PO SOLN: 5.0000 mg | Freq: Once | ORAL | Status: DC | PRN

## 2018-09-07 MED ORDER — SODIUM CHLORIDE (PF) 0.9 % IJ SOLN
INTRAMUSCULAR | Status: AC
  Filled 2018-09-07: qty 10

## 2018-09-07 MED ORDER — LIDOCAINE 2% (20 MG/ML) 5 ML SYRINGE
INTRAMUSCULAR | Status: AC
  Filled 2018-09-07: qty 5

## 2018-09-07 MED ORDER — KETOROLAC TROMETHAMINE 15 MG/ML IJ SOLN
15.0000 mg | Freq: Four times a day (QID) | INTRAMUSCULAR | Status: DC
  Administered 2018-09-07 – 2018-09-08 (×4): 15 mg via INTRAVENOUS
  Filled 2018-09-07 (×4): qty 1

## 2018-09-07 MED ORDER — FENTANYL CITRATE (PF) 100 MCG/2ML IJ SOLN
INTRAMUSCULAR | Status: AC
  Filled 2018-09-07: qty 2

## 2018-09-07 MED ORDER — METHOCARBAMOL 500 MG IVPB - SIMPLE MED
INTRAVENOUS | Status: AC
  Filled 2018-09-07: qty 50

## 2018-09-07 MED ORDER — ACETAMINOPHEN 10 MG/ML IV SOLN
INTRAVENOUS | Status: AC
  Filled 2018-09-07: qty 100

## 2018-09-07 MED ORDER — SORBITOL 70 % SOLN
30.0000 mL | Freq: Every day | Status: DC | PRN
  Filled 2018-09-07: qty 30

## 2018-09-07 MED ORDER — LIDOCAINE 2% (20 MG/ML) 5 ML SYRINGE
INTRAMUSCULAR | Status: DC | PRN
  Administered 2018-09-07: 80 mg via INTRAVENOUS

## 2018-09-07 MED ORDER — ONDANSETRON HCL 4 MG/2ML IJ SOLN
INTRAMUSCULAR | Status: DC | PRN
  Administered 2018-09-07: 4 mg via INTRAVENOUS

## 2018-09-07 MED ORDER — ONDANSETRON HCL 4 MG PO TABS
4.0000 mg | ORAL_TABLET | Freq: Four times a day (QID) | ORAL | Status: DC | PRN
  Administered 2018-09-07: 4 mg via ORAL
  Filled 2018-09-07: qty 1

## 2018-09-07 MED ORDER — MIDAZOLAM HCL 2 MG/2ML IJ SOLN
1.0000 mg | INTRAMUSCULAR | Status: DC
  Administered 2018-09-07: 2 mg via INTRAVENOUS
  Filled 2018-09-07: qty 2

## 2018-09-07 MED ORDER — CHLORHEXIDINE GLUCONATE 4 % EX LIQD: 60.0000 mL | Freq: Once | CUTANEOUS | Status: DC

## 2018-09-07 MED ORDER — METOCLOPRAMIDE HCL 5 MG PO TABS: 5.0000 mg | ORAL_TABLET | Freq: Three times a day (TID) | ORAL | Status: DC | PRN

## 2018-09-07 MED ORDER — POLYETHYLENE GLYCOL 3350 17 G PO PACK: 17.0000 g | PACK | Freq: Every day | ORAL | Status: DC | PRN

## 2018-09-07 MED ORDER — OXYCODONE HCL 5 MG PO TABS: 5.0000 mg | ORAL_TABLET | ORAL | 0 refills | Status: AC | PRN

## 2018-09-07 MED ORDER — CEFAZOLIN SODIUM-DEXTROSE 2-4 GM/100ML-% IV SOLN
2.0000 g | Freq: Four times a day (QID) | INTRAVENOUS | Status: AC
  Administered 2018-09-07 (×2): 2 g via INTRAVENOUS
  Filled 2018-09-07 (×2): qty 100

## 2018-09-07 MED ORDER — CEFAZOLIN SODIUM-DEXTROSE 2-4 GM/100ML-% IV SOLN
2.0000 g | INTRAVENOUS | Status: AC
  Administered 2018-09-07: 2 g via INTRAVENOUS
  Filled 2018-09-07: qty 100

## 2018-09-07 MED ORDER — ACETAMINOPHEN 500 MG PO TABS
1000.0000 mg | ORAL_TABLET | Freq: Once | ORAL | Status: AC
  Administered 2018-09-07: 1000 mg via ORAL
  Filled 2018-09-07: qty 2

## 2018-09-07 MED ORDER — BUPIVACAINE LIPOSOME 1.3 % IJ SUSP
INTRAMUSCULAR | Status: DC | PRN
  Administered 2018-09-07: 20 mL

## 2018-09-07 SURGICAL SUPPLY — 49 items
BEARING MENISCAL TIBIAL 3 SM L (Orthopedic Implant) ×2 IMPLANT
BNDG ELASTIC 6X10 VLCR STRL LF (GAUZE/BANDAGES/DRESSINGS) ×2 IMPLANT
BOWL SMART MIX CTS (DISPOSABLE) ×2 IMPLANT
CEMENT BONE R 1X40 (Cement) ×2 IMPLANT
CHLORAPREP W/TINT 26ML (MISCELLANEOUS) ×2 IMPLANT
COVER SURGICAL LIGHT HANDLE (MISCELLANEOUS) ×2 IMPLANT
COVER WAND RF STERILE (DRAPES) IMPLANT
CUFF TOURN SGL QUICK 34 (TOURNIQUET CUFF) ×1
CUFF TRNQT CYL 34X4X40X1 (TOURNIQUET CUFF) ×1 IMPLANT
DRAPE ARTHROSCOPY W/POUCH 114 (DRAPES) ×2 IMPLANT
DRAPE IMP U-DRAPE 54X76 (DRAPES) ×4 IMPLANT
DRAPE U-SHAPE 47X51 STRL (DRAPES) ×2 IMPLANT
DRSG MEPILEX BORDER 4X8 (GAUZE/BANDAGES/DRESSINGS) ×2 IMPLANT
ELECT REM PT RETURN 15FT ADLT (MISCELLANEOUS) ×2 IMPLANT
GLOVE BIO SURGEON STRL SZ7.5 (GLOVE) ×4 IMPLANT
GLOVE BIOGEL PI IND STRL 7.0 (GLOVE) ×2 IMPLANT
GLOVE BIOGEL PI IND STRL 7.5 (GLOVE) ×3 IMPLANT
GLOVE BIOGEL PI IND STRL 8 (GLOVE) ×2 IMPLANT
GLOVE BIOGEL PI INDICATOR 7.0 (GLOVE) ×2
GLOVE BIOGEL PI INDICATOR 7.5 (GLOVE) ×3
GLOVE BIOGEL PI INDICATOR 8 (GLOVE) ×2
GOWN STRL REUS W/ TWL LRG LVL3 (GOWN DISPOSABLE) ×2 IMPLANT
GOWN STRL REUS W/ TWL XL LVL3 (GOWN DISPOSABLE) ×1 IMPLANT
GOWN STRL REUS W/TWL LRG LVL3 (GOWN DISPOSABLE) ×2
GOWN STRL REUS W/TWL XL LVL3 (GOWN DISPOSABLE) ×1
HANDPIECE INTERPULSE COAX TIP (DISPOSABLE) ×1
IMMOBILIZER KNEE 20 (SOFTGOODS) ×2
IMMOBILIZER KNEE 20 THIGH 36 (SOFTGOODS) ×1 IMPLANT
INSERT TIBIAL OXFORD SZ B LF (Joint) ×2 IMPLANT
MANIFOLD NEPTUNE II (INSTRUMENTS) ×2 IMPLANT
PACK BLADE SAW RECIP 70 3 PT (BLADE) ×2 IMPLANT
PACK ICE MAXI GEL EZY WRAP (MISCELLANEOUS) ×2 IMPLANT
PACK TOTAL KNEE CUSTOM (KITS) ×2 IMPLANT
PEG FEMORAL PEGGED STRL SM (Knees) ×2 IMPLANT
POSITIONER SURGICAL ARM (MISCELLANEOUS) ×2 IMPLANT
SET HNDPC FAN SPRY TIP SCT (DISPOSABLE) ×1 IMPLANT
STRIP CLOSURE SKIN 1/2X4 (GAUZE/BANDAGES/DRESSINGS) ×2 IMPLANT
SUCTION FRAZIER HANDLE 10FR (MISCELLANEOUS) ×1
SUCTION TUBE FRAZIER 10FR DISP (MISCELLANEOUS) ×1 IMPLANT
SUT MNCRL AB 4-0 PS2 18 (SUTURE) ×2 IMPLANT
SUT VIC AB 0 CT1 27 (SUTURE) ×1
SUT VIC AB 0 CT1 27XBRD ANTBC (SUTURE) ×1 IMPLANT
SUT VIC AB 1 CT1 27 (SUTURE) ×1
SUT VIC AB 1 CT1 27XBRD ANTBC (SUTURE) ×1 IMPLANT
SUT VIC AB 2-0 CT1 27 (SUTURE) ×1
SUT VIC AB 2-0 CT1 TAPERPNT 27 (SUTURE) ×1 IMPLANT
TRAY FOLEY MTR SLVR 14FR STAT (SET/KITS/TRAYS/PACK) ×2 IMPLANT
WRAP KNEE MAXI GEL POST OP (GAUZE/BANDAGES/DRESSINGS) ×2 IMPLANT
YANKAUER SUCT BULB TIP 10FT TU (MISCELLANEOUS) ×2 IMPLANT

## 2018-09-07 NOTE — Discharge Instructions (Signed)

## 2018-09-07 NOTE — Anesthesia Procedure Notes (Signed)
Anesthesia Regional Block: Adductor canal block   Pre-Anesthetic Checklist: ,, timeout performed, Correct Patient, Correct Site, Correct Laterality, Correct Procedure, Correct Position, site marked, Risks and benefits discussed,  Surgical consent,  Pre-op evaluation,  At surgeon's request and post-op pain management  Laterality: Left  Prep: chloraprep       Needles:  Injection technique: Single-shot  Needle Type: Echogenic Needle     Needle Length: 10cm  Needle Gauge: 21     Additional Needles:   Narrative:  Start time: 09/07/2018 8:48 AM End time: 09/07/2018 8:52 AM Injection made incrementally with aspirations every 5 mL.  Performed by: Personally  Anesthesiologist: Audry Pili, MD  Additional Notes: No pain on injection. No increased resistance to injection. Injection made in 5cc increments. Good needle visualization. Patient tolerated the procedure well.

## 2018-09-07 NOTE — Transfer of Care (Signed)
Immediate Anesthesia Transfer of Care Note  Patient: Beth Huffman  Procedure(s) Performed: UNICOMPARTMENTAL LEFT KNEE (Left Knee)  Patient Location: PACU  Anesthesia Type:GA combined with regional for post-op pain  Level of Consciousness: drowsy and patient cooperative  Airway & Oxygen Therapy: Patient Spontanous Breathing and Patient connected to face mask oxygen  Post-op Assessment: Report given to RN and Post -op Vital signs reviewed and stable  Post vital signs: Reviewed and stable  Last Vitals:  Vitals Value Taken Time  BP 121/75 09/07/2018 12:01 PM  Temp    Pulse 81 09/07/2018 12:05 PM  Resp 9 09/07/2018 12:05 PM  SpO2 100 % 09/07/2018 12:05 PM  Vitals shown include unvalidated device data.  Last Pain:  Vitals:   09/07/18 0744  TempSrc: Oral         Complications: No apparent anesthesia complications

## 2018-09-07 NOTE — Anesthesia Postprocedure Evaluation (Signed)
Anesthesia Post Note  Patient: TYREONA PANJWANI  Procedure(s) Performed: UNICOMPARTMENTAL LEFT KNEE (Left Knee)     Patient location during evaluation: PACU Anesthesia Type: General Level of consciousness: awake and alert Pain management: pain level controlled Vital Signs Assessment: post-procedure vital signs reviewed and stable Respiratory status: spontaneous breathing, nonlabored ventilation, respiratory function stable and patient connected to nasal cannula oxygen Cardiovascular status: blood pressure returned to baseline and stable Postop Assessment: no apparent nausea or vomiting Anesthetic complications: no    Last Vitals:  Vitals:   09/07/18 1330 09/07/18 1352  BP:  124/85  Pulse:  74  Resp:  14  Temp: 36.7 C   SpO2:  99%    Last Pain:  Vitals:   09/07/18 1215  TempSrc:   PainSc: Asleep    LLE Motor Response: Purposeful movement (09/07/18 1400)   RLE Motor Response: Purposeful movement (09/07/18 1400)        Audry Pili

## 2018-09-07 NOTE — Anesthesia Preprocedure Evaluation (Addendum)
Anesthesia Evaluation  Patient identified by MRN, date of birth, ID band Patient awake    Reviewed: Allergy & Precautions, NPO status , Patient's Chart, lab work & pertinent test results  History of Anesthesia Complications Negative for: history of anesthetic complications  Airway Mallampati: II  TM Distance: >3 FB Neck ROM: Full    Dental  (+) Dental Advisory Given, Teeth Intact   Pulmonary neg pulmonary ROS,    breath sounds clear to auscultation       Cardiovascular hypertension, Pt. on medications  Rhythm:Regular Rate:Normal   '19 Cath - Normal coronary arteries Normal left ventricular size and function.  EF 65%  '19 TTE - EF 60% to 65%. There is a mobile echobright small density in the mid LV cavity consistent with redundant chordae tendinae. Trivial TR.    Neuro/Psych  Headaches, PSYCHIATRIC DISORDERS Anxiety  OCD  Syringomyelia S/p intracranial decompression surgery    GI/Hepatic Neg liver ROS, GERD  Medicated and Controlled,  Endo/Other   Obesity   Renal/GU negative Renal ROS  Female GU complaint     Musculoskeletal  (+) Arthritis ,   Abdominal (+) + obese,   Peds  Hematology negative hematology ROS (+)   Anesthesia Other Findings   Reproductive/Obstetrics  S/p hysterectomy                            Anesthesia Physical Anesthesia Plan  ASA: III  Anesthesia Plan: General   Post-op Pain Management:  Regional for Post-op pain   Induction: Intravenous  PONV Risk Score and Plan: 3 and Treatment may vary due to age or medical condition, Ondansetron, Dexamethasone and Midazolam  Airway Management Planned: LMA  Additional Equipment: None  Intra-op Plan:   Post-operative Plan: Extubation in OR  Informed Consent: I have reviewed the patients History and Physical, chart, labs and discussed the procedure including the risks, benefits and alternatives for the  proposed anesthesia with the patient or authorized representative who has indicated his/her understanding and acceptance.   Dental advisory given  Plan Discussed with: CRNA and Anesthesiologist  Anesthesia Plan Comments:        Anesthesia Quick Evaluation

## 2018-09-07 NOTE — Progress Notes (Signed)
AssistedDr. Brock with left, ultrasound guided, adductor canal block. Side rails up, monitors on throughout procedure. See vital signs in flow sheet. Tolerated Procedure well.  

## 2018-09-07 NOTE — Op Note (Signed)
09/07/2018  11:14 AM  PATIENT:  Beth Huffman    PRE-OPERATIVE DIAGNOSIS:  OA LEFT KNEE  POST-OPERATIVE DIAGNOSIS:  Same  PROCEDURE:  UNICOMPARTMENTAL LEFT KNEE  SURGEON:  Renette Butters, MD  PHYSICIAN ASSISTANT: Roxan Hockey, PA-C, he was present and scrubbed throughout the case, critical for completion in a timely fashion, and for retraction, instrumentation, and closure.   ANESTHESIA:   General  PREOPERATIVE INDICATIONS:  Beth Huffman is a  57 y.o. female with a diagnosis of OA LEFT KNEE who failed conservative measures and elected for surgical management.    The risks benefits and alternatives were discussed with the patient preoperatively including but not limited to the risks of infection, bleeding, nerve injury, cardiopulmonary complications, blood clots, the need for revision surgery, among others, and the patient was willing to proceed.  OPERATIVE IMPLANTS: Biomet [place] mobile bearing medial compartment arthroplasty. Femoral Component: small. Tibial tray: B, Size 3 poly.   OPERATIVE FINDINGS: Endstage grade 4 medial compartment osteoarthritis. No significant changes in the lateral or patellofemoral joint  OPERATIVE PROCEDURE: The patient was brought to the operating room placed in supine position. General anesthesia was administered. IV antibiotics were given. The lower extremity was placed in the legholder and prepped and draped in usual sterile fashion.  Time out was performed.  The leg was elevated and exsanguinated and the tourniquet was inflated. Anteromedial incision was performed, and I took care to preserve the MCL. Parapatellar incision was carried out, and the osteophytes were excised, along with the medial meniscus and a small portion of the fat pad.  The extra medullary tibial cutting jig was applied, using the spoon and the 40mm G-Clamp, and I took care to protect the anterior cruciate ligament insertion and the tibial spine. The medial collateral  ligament was also protected, and I resected my proximal tibia, matching the anatomic slope.   The proximal tibial bony cut was removed in one piece, and I turned my attention to the femur.  The intramedullary femoral rod was placed using the drill, and then using the appropriate reference, I assembled the femoral jig, setting my posterior cutting block. I resected my posterior femur, and then measured my gap.   I then used the mill to match the extension gap to the flexion gap. The gaps were then measured again with the appropriate feeler gauges. Once I had balanced flexion and extension gaps, I then completed the preparation of the femur.  I milled off the anterior aspect of the distal femur to prevent impingement. I also exposed the tibia, and selected the above-named component, and then used the cutting jig to prepare the keel slot on the tibia. I also used the awl to curette out the bone to complete the preparation of the keel. The back wall was intact.  I then placed trial components, and it was found to have excellent motion, and appropriate balance.  I then cemented the components into place, cementing the tibia first, removing all excess cement, and then cementing the femur.  All loose cement was removed.  The real polyethylene insert was applied manually, and the knee was taken through functional range of motion, and found to have excellent stability and restoration of joint motion, with excellent balance.  The wounds were irrigated copiously, and the parapatellar tissue closed with Vicryl, followed by Vicryl for the subcutaneous tissue, with routine closure with Steri-Strips and sterile gauze.  The tourniquet was released, and the patient was awakened and extubated and returned to PACU  in stable and satisfactory condition. There were no complications.  POSTOPERATIVE PLAN: DVT px will consist of SCD's, mobiliation and chemical px, WBAT     Renette Butters, MD

## 2018-09-07 NOTE — Anesthesia Procedure Notes (Signed)
Procedure Name: LMA Insertion Date/Time: 09/07/2018 10:01 AM Performed by: Montel Clock, CRNA Pre-anesthesia Checklist: Patient identified, Emergency Drugs available, Suction available, Patient being monitored and Timeout performed Patient Re-evaluated:Patient Re-evaluated prior to induction Oxygen Delivery Method: Circle system utilized Preoxygenation: Pre-oxygenation with 100% oxygen Induction Type: IV induction LMA: LMA with gastric port inserted LMA Size: 4.0 Number of attempts: 1 Dental Injury: Teeth and Oropharynx as per pre-operative assessment

## 2018-09-07 NOTE — Evaluation (Signed)
Physical Therapy Evaluation Patient Details Name: Beth Huffman MRN: [id number] DOB: 06-23-1961 Today's Date: 09/07/2018   History of Present Illness  57 yo female s/p L UKR on 09/07/18. PMH includes GERD, HTN, LBP, neck pain due to syringomyelia, OCD, stress, UTI.   Clinical Impression   Pt presents with L knee pain, baseline nausea post-surgery not exacerbated by mobility, L knee decreased ROM, and increased time and effort to perform mobility tasks. Pt to benefit from acute PT to address deficits. Pt ambulated 20 ft with RW with min guard assist and verbal cuing. PT to progress mobility as tolerated, and will continue to follow acutely.      Follow Up Recommendations Follow surgeon's recommendation for DC plan and follow-up therapies;Supervision for mobility/OOB    Equipment Recommendations  Rolling walker with 5" wheels    Recommendations for Other Services       Precautions / Restrictions Precautions Precautions: Fall Restrictions Weight Bearing Restrictions: No Other Position/Activity Restrictions: WBAT       Mobility  Bed Mobility Overal bed mobility: Needs Assistance Bed Mobility: Supine to Sit     Supine to sit: Min guard;HOB elevated     General bed mobility comments: Min guard for safety. Verbal cuing for sequencing. Increased time. Pt encouraged to sit EOB for 1-2 minutes due to pt feeling nauseous this afternoon post-surgery. Pt with no reports of increased nausea.   Transfers Overall transfer level: Needs assistance Equipment used: Rolling walker (2 wheeled) Transfers: Sit to/from Stand Sit to Stand: Min guard;From elevated surface         General transfer comment: Min guard for safety. Verbal cuing for hand placement.   Ambulation/Gait Ambulation/Gait assistance: Min guard Gait Distance (Feet): 25 Feet Assistive device: Rolling walker (2 wheeled) Gait Pattern/deviations: Step-to pattern;Decreased stride length Gait velocity: decr    General  Gait Details: Min guard for safety. Verbal cuing for placement in RW, sequencing, turning.   Stairs            Wheelchair Mobility    Modified Rankin (Stroke Patients Only)       Balance Overall balance assessment: Mild deficits observed, not formally tested                                           Pertinent Vitals/Pain Pain Assessment: 0-10 Pain Score: 5  Pain Location: L knee, headache  Pain Descriptors / Indicators: Aching;Sore;Headache Pain Intervention(s): Limited activity within patient's tolerance;Repositioned;Ice applied;Monitored during session;Premedicated before session    Home Living Family/patient expects to be discharged to:: Private residence Living Arrangements: Spouse/significant other Available Help at Discharge: Family;Available PRN/intermittently Type of Home: House Home Access: Stairs to enter Entrance Stairs-Rails: None Entrance Stairs-Number of Steps: 1 Home Layout: One level Home Equipment: Shower seat - built in      Prior Function Level of Independence: Independent               Hand Dominance   Dominant Hand: Left    Extremity/Trunk Assessment   Upper Extremity Assessment Upper Extremity Assessment: Overall WFL for tasks assessed    Lower Extremity Assessment Lower Extremity Assessment: Overall WFL for tasks assessed;LLE deficits/detail;RLE deficits/detail RLE Deficits / Details: Pt reported R sided weakness due to syringomyelia, but PT did not observe this during session  LLE Deficits / Details: suspected post-surgical weakness; able to perform quad set, ankle pump, heel slide, SLR with <  10* quad lag  LLE Sensation: WNL    Cervical / Trunk Assessment Cervical / Trunk Assessment: Normal  Communication   Communication: No difficulties  Cognition Arousal/Alertness: Awake/alert Behavior During Therapy: WFL for tasks assessed/performed Overall Cognitive Status: Within Functional Limits for tasks  assessed                                        General Comments      Exercises Total Joint Exercises Goniometric ROM: knee aarom ~5-50*, limited by pain    Assessment/Plan    PT Assessment Patient needs continued PT services  PT Problem List Decreased strength;Pain;Decreased range of motion;Decreased activity tolerance;Decreased knowledge of use of DME;Decreased balance;Decreased mobility       PT Treatment Interventions DME instruction;Therapeutic activities;Gait training;Therapeutic exercise;Patient/family education;Stair training;Balance training;Functional mobility training    PT Goals (Current goals can be found in the Care Plan section)  Acute Rehab PT Goals Patient Stated Goal: none stated  PT Goal Formulation: With patient Time For Goal Achievement: 09/14/18 Potential to Achieve Goals: Good    Frequency 7X/week   Barriers to discharge        Co-evaluation               AM-PAC PT "6 Clicks" Daily Activity  Outcome Measure Difficulty turning over in bed (including adjusting bedclothes, sheets and blankets)?: Unable Difficulty moving from lying on back to sitting on the side of the bed? : Unable Difficulty sitting down on and standing up from a chair with arms (e.g., wheelchair, bedside commode, etc,.)?: Unable Help needed moving to and from a bed to chair (including a wheelchair)?: A Little Help needed walking in hospital room?: A Little Help needed climbing 3-5 steps with a railing? : A Little 6 Click Score: 12    End of Session Equipment Utilized During Treatment: Gait belt Activity Tolerance: Patient tolerated treatment well Patient left: in chair;with call bell/phone within reach;with family/visitor present;with SCD's reapplied(Pt agrees to press call bell button and wait for assistance before mobilizing. family in room. ) Nurse Communication: Mobility status PT Visit Diagnosis: Other abnormalities of gait and mobility  (R26.89);Difficulty in walking, not elsewhere classified (R26.2)    Time: 1884-1660 PT Time Calculation (min) (ACUTE ONLY): 27 min   Charges:   PT Evaluation $PT Eval Low Complexity: 1 Low PT Treatments $Gait Training: 8-22 mins       Julien Girt, PT Acute Rehabilitation Services Pager [phone]  Office [phone]   Roxine Caddy D Elonda Husky 09/07/2018, 7:37 PM

## 2018-09-08 ENCOUNTER — Encounter (HOSPITAL_COMMUNITY): Payer: Self-pay | Admitting: Orthopedic Surgery

## 2018-09-08 DIAGNOSIS — G43909 Migraine, unspecified, not intractable, without status migrainosus: Secondary | ICD-10-CM | POA: Diagnosis not present

## 2018-09-08 DIAGNOSIS — Z96652 Presence of left artificial knee joint: Secondary | ICD-10-CM | POA: Diagnosis not present

## 2018-09-08 DIAGNOSIS — R112 Nausea with vomiting, unspecified: Secondary | ICD-10-CM | POA: Diagnosis not present

## 2018-09-08 DIAGNOSIS — K5900 Constipation, unspecified: Secondary | ICD-10-CM | POA: Diagnosis not present

## 2018-09-08 DIAGNOSIS — N393 Stress incontinence (female) (male): Secondary | ICD-10-CM | POA: Diagnosis not present

## 2018-09-08 DIAGNOSIS — R002 Palpitations: Secondary | ICD-10-CM | POA: Diagnosis not present

## 2018-09-08 DIAGNOSIS — R1012 Left upper quadrant pain: Secondary | ICD-10-CM | POA: Diagnosis not present

## 2018-09-08 DIAGNOSIS — K219 Gastro-esophageal reflux disease without esophagitis: Secondary | ICD-10-CM | POA: Diagnosis not present

## 2018-09-08 DIAGNOSIS — I10 Essential (primary) hypertension: Secondary | ICD-10-CM | POA: Diagnosis not present

## 2018-09-08 DIAGNOSIS — M542 Cervicalgia: Secondary | ICD-10-CM | POA: Diagnosis not present

## 2018-09-08 DIAGNOSIS — M1712 Unilateral primary osteoarthritis, left knee: Secondary | ICD-10-CM | POA: Diagnosis not present

## 2018-09-08 DIAGNOSIS — G950 Syringomyelia and syringobulbia: Secondary | ICD-10-CM | POA: Diagnosis not present

## 2018-09-08 DIAGNOSIS — M545 Low back pain: Secondary | ICD-10-CM | POA: Diagnosis not present

## 2018-09-08 DIAGNOSIS — R0600 Dyspnea, unspecified: Secondary | ICD-10-CM | POA: Diagnosis not present

## 2018-09-08 DIAGNOSIS — R748 Abnormal levels of other serum enzymes: Secondary | ICD-10-CM | POA: Diagnosis not present

## 2018-09-08 DIAGNOSIS — F429 Obsessive-compulsive disorder, unspecified: Secondary | ICD-10-CM | POA: Diagnosis not present

## 2018-09-08 DIAGNOSIS — M25561 Pain in right knee: Secondary | ICD-10-CM | POA: Diagnosis not present

## 2018-09-08 MED ORDER — OXYCODONE HCL 5 MG PO TABS
5.0000 mg | ORAL_TABLET | ORAL | Status: DC | PRN
  Administered 2018-09-08 (×2): 10 mg via ORAL
  Filled 2018-09-08 (×2): qty 2

## 2018-09-08 NOTE — Evaluation (Signed)
Occupational Therapy Evaluation Patient Details Name: Beth Huffman MRN: [id number] DOB: Jan 25, 1961 Today's Date: 09/08/2018    History of Present Illness 57 yo female s/p L UKR on 09/07/18. PMH includes GERD, HTN, LBP, neck pain due to syringomyelia, OCD, stress, UTI.    Clinical Impression   Pt was admitted for the above sx. All education was completed. No further OT is needed at this time     Follow Up Recommendations  No OT follow up    Equipment Recommendations  None recommended by OT    Recommendations for Other Services       Precautions / Restrictions Precautions Precautions: Fall Restrictions Other Position/Activity Restrictions: WBAT       Mobility Bed Mobility         Supine to sit: Supervision        Transfers   Equipment used: Rolling walker (2 wheeled)   Sit to Stand: Min guard;Min assist         General transfer comment: min guard from bed and light min A from comfort height commode. Cues for UE/LE placement    Balance                                           ADL either performed or assessed with clinical judgement   ADL Overall ADL's : Needs assistance/impaired     Grooming: Wash/dry hands;Supervision/safety;Standing       Lower Body Bathing: Minimal assistance;Sit to/from stand       Lower Body Dressing: Minimal assistance;Sit to/from stand   Toilet Transfer: Minimal assistance;Ambulation;RW;Comfort height toilet   Toileting- Clothing Manipulation and Hygiene: Sitting/lateral lean;Minimal assistance   Tub/ Shower Transfer: Walk-in shower;Min guard;Ambulation;3 in 1(simulated small ledge)     General ADL Comments: pt can perform UB adls with set up. Ambulated to bathroom, used commode with steadying assist to rise/lower and completed bathing/LB dressing. Simulated small shower ledge when leaving bathroom.  Educated on safety with RW and knee precautions     Vision         Perception     Praxis       Pertinent Vitals/Pain Pain Score: 5  Pain Location: L knee Pain Descriptors / Indicators: Aching Pain Intervention(s): Limited activity within patient's tolerance;Monitored during session;Premedicated before session;Repositioned     Hand Dominance Left   Extremity/Trunk Assessment Upper Extremity Assessment Upper Extremity Assessment: Overall WFL for tasks assessed           Communication Communication Communication: No difficulties   Cognition Arousal/Alertness: Awake/alert Behavior During Therapy: WFL for tasks assessed/performed Overall Cognitive Status: Within Functional Limits for tasks assessed                                     General Comments       Exercises     Shoulder Instructions      Home Living Family/patient expects to be discharged to:: Private residence Living Arrangements: Spouse/significant other Available Help at Discharge: Family;Available PRN/intermittently               Bathroom Shower/Tub: Walk-in shower   Bathroom Toilet: Handicapped height     Home Equipment: Shower seat - built in          Prior Functioning/Environment Level of Independence: Independent  OT Problem List:        OT Treatment/Interventions:      OT Goals(Current goals can be found in the care plan section) Acute Rehab OT Goals Patient Stated Goal: none stated  OT Goal Formulation: All assessment and education complete, DC therapy  OT Frequency:     Barriers to D/C:            Co-evaluation              AM-PAC PT "6 Clicks" Daily Activity     Outcome Measure Help from another person eating meals?: None Help from another person taking care of personal grooming?: A Little Help from another person toileting, which includes using toliet, bedpan, or urinal?: A Little Help from another person bathing (including washing, rinsing, drying)?: A Little Help from another person to put on and taking off  regular upper body clothing?: A Little Help from another person to put on and taking off regular lower body clothing?: A Little 6 Click Score: 19   End of Session    Activity Tolerance: Patient tolerated treatment well Patient left: in chair;with call bell/phone within reach;with family/visitor present  OT Visit Diagnosis: Pain Pain - Right/Left: Left Pain - part of body: Knee                Time: 8867-7373 OT Time Calculation (min): 22 min Charges:  OT General Charges $OT Visit: 1 Visit OT Evaluation $OT Eval Low Complexity: 1 Low  Lesle Chris, OTR/L Acute Rehabilitation Services [phone] [facility] pager [phone] office 09/08/2018  [place] 09/08/2018, 10:12 AM

## 2018-09-08 NOTE — Plan of Care (Signed)
  Problem: Health Behavior/Discharge Planning: Goal: Ability to manage health-related needs will improve Outcome: Progressing   Problem: Clinical Measurements: Goal: Ability to maintain clinical measurements within normal limits will improve Outcome: Progressing Goal: Will remain free from infection Outcome: Progressing Goal: Diagnostic test results will improve Outcome: Progressing Goal: Respiratory complications will improve Outcome: Progressing Goal: Cardiovascular complication will be avoided Outcome: Progressing   Problem: Activity: Goal: Risk for activity intolerance will decrease Outcome: Progressing   Problem: Nutrition: Goal: Adequate nutrition will be maintained Outcome: Progressing   Problem: Coping: Goal: Level of anxiety will decrease Outcome: Progressing   Problem: Elimination: Goal: Will not experience complications related to bowel motility Outcome: Progressing Goal: Will not experience complications related to urinary retention Outcome: Progressing   Problem: Pain Managment: Goal: General experience of comfort will improve Outcome: Progressing   Problem: Safety: Goal: Ability to remain free from injury will improve Outcome: Progressing   Problem: Skin Integrity: Goal: Risk for impaired skin integrity will decrease Outcome: Progressing   Problem: Activity: Goal: Ability to avoid complications of mobility impairment will improve Outcome: Progressing Goal: Range of joint motion will improve Outcome: Progressing   Problem: Clinical Measurements: Goal: Postoperative complications will be avoided or minimized Outcome: Progressing   Problem: Pain Management: Goal: Pain level will decrease with appropriate interventions Outcome: Progressing   Problem: Skin Integrity: Goal: Will show signs of wound healing Outcome: Progressing

## 2018-09-08 NOTE — Care Management Note (Signed)
Case Management Note  Patient Details  Name: Beth Huffman MRN: [id number] Date of Birth: [birth year]  Subjective/Objective:     Spoke with patient at bedside. Confirmed plan for OP PT, already arranged. Needs a RW and 3n1, [employer] to deliver to room. [phone]               Action/Plan:   Expected Discharge Date:  09/08/18               Expected Discharge Plan:  OP Rehab  In-House Referral:  NA  Discharge planning Services  CM Consult  Post Acute Care Choice:  Durable Medical Equipment Choice offered to:  Patient, Spouse  DME Arranged:  3-N-1, Walker rolling DME Agency:  [employer]  HH Arranged:  NA HH Agency:  NA  Status of Service:  Completed, signed off  If discussed at [employer] of Stay Meetings, dates discussed:    Additional Comments:  Guadalupe Maple, RN 09/08/2018, 9:36 AM

## 2018-09-08 NOTE — Progress Notes (Signed)
    Subjective: Patient reports pain as moderate to severe.  Tolerating diet.  Urinating.  No CP, SOB.  Some headache and nausea yesterday resolved.  OOB therapy.  Objective:   VITALS:   Vitals:   09/07/18 1720 09/07/18 2126 09/08/18 0154 09/08/18 0535  BP: 135/77 130/70 128/79 135/71  Pulse: 91 80 80 82  Resp: 16 16 14 16   Temp:  98 F (36.7 C) 98 F (36.7 C) 98.2 F (36.8 C)  TempSrc:  Oral  Oral  SpO2: 98% 98% 96% 96%  Weight:      Height:       CBC Latest Ref Rng & Units 08/31/2018 04/28/2018 03/31/2018  WBC 4.0 - 10.5 K/uL 4.1 7.5 5.7  Hemoglobin 12.0 - 15.0 g/dL 13.2 12.7 12.7  Hematocrit 36.0 - 46.0 % 41.3 40.2 37.2  Platelets 150 - 400 K/uL 213 257 209.0   BMP Latest Ref Rng & Units 08/31/2018 08/03/2018 04/28/2018  Glucose 70 - 99 mg/dL 87 101(H) 92  BUN 6 - 20 mg/dL 16 20 20   Creatinine 0.44 - 1.00 mg/dL 0.98 1.11 0.98  BUN/Creat Ratio 9 - 23 - - 20  Sodium 135 - 145 mmol/L 142 139 145(H)  Potassium 3.5 - 5.1 mmol/L 3.8 3.8 4.2  Chloride 98 - 111 mmol/L 111 107 106  CO2 22 - 32 mmol/L 24 24 23   Calcium 8.9 - 10.3 mg/dL 9.7 9.5 9.8   Intake/Output      11/12 0701 - 11/13 0700 11/13 0701 - 11/14 0700   P.O. 440    I.V. (mL/kg) 1346.6 (16.6)    IV Piggyback 200    Total Intake(mL/kg) 1986.6 (24.4)    Urine (mL/kg/hr) 3600    Blood 50    Total Output 3650    Net -1663.5            Physical Exam: General: NAD.  Supine in bed on arrival.  Calm, conversant. Resp: No increased wob Cardio: regular rate and rhythm ABD soft Neurologically intact MSK LLE: Neurovascularly intact Sensation intact distally Feet warm Dorsiflexion/Plantar flexion intact Incision: dressing C/D/I scant stable drainage   Assessment: 1 Day Post-Op  S/P Procedure(s) (LRB): UNICOMPARTMENTAL LEFT KNEE (Left) by Dr. Ernesta Amble. Percell Miller on 09/2017  Principal Problem:   Primary osteoarthritis of left knee Active Problems:   SYRINGOMYELIA   GERD   LOW BACK PAIN   Migraine, chronic,  without aura, intractable   Primary osteoarthritis, status post left unicompartmental knee arthroplasty Doing well postop day 1 Eating, drinking, and voiding Pain controlled Mobilizing with therapy  Plan: Advance diet Up with therapy Incentive Spirometry Elevate and Apply ice CPM, bone foam  Weight Bearing: Weight Bearing as Tolerated (WBAT) LLE Dressings: Maintain Mepilex.  Please apply thigh high TED hose to operative leg prior to discharge. VTE prophylaxis: Aspirin, SCDs, ambulation Dispo: Home today after therapy session.   Patient's anticipated LOS is less than 2 midnights, meeting these requirements: - Younger than 53 - Lives within 1 hour of care - Has a competent adult at home to recover with post-op recover - NO history of  - Diabetes  - Coronary Artery Disease  - Heart failure  - Heart attack  - Stroke  - DVT/VTE  - Cardiac arrhythmia  - Respiratory Failure/COPD  - Renal failure  - Anemia  - Advanced Liver disease    Beth Burly III, PA-C 09/08/2018, 7:24 AM
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
## 2018-09-08 NOTE — Progress Notes (Signed)
Physical Therapy Treatment Patient Details Name: Beth Huffman MRN: [id number] DOB: 1961/07/16 Today's Date: 09/08/2018    History of Present Illness 57 yo female s/p L UKR on 09/07/18. PMH includes GERD, HTN, LBP, neck pain due to syringomyelia, OCD, stress, UTI.     PT Comments    Pt is progressing well with mobility and is ready to DC home from PT standpoint. She ambulated 120' with RW, distance limited by pain, no loss of balance. She performed HEP with min assist and completed stair training. She reports she has outpt PT appointment tomorrow morning.    Follow Up Recommendations  Follow surgeon's recommendation for DC plan and follow-up therapies;Supervision for mobility/OOB;Outpatient PT     Equipment Recommendations  Rolling walker with 5" wheels    Recommendations for Other Services       Precautions / Restrictions Precautions Precautions: Fall;Knee Precaution Booklet Issued: Yes (comment) Precaution Comments: reviewed no pillow under knee Restrictions Other Position/Activity Restrictions: WBAT     Mobility  Bed Mobility         Supine to sit: Supervision     General bed mobility comments: up in recliner  Transfers   Equipment used: Rolling walker (2 wheeled) Transfers: Sit to/from Stand Sit to Stand: Supervision         General transfer comment:  Cues for UE/LE placement  Ambulation/Gait Ambulation/Gait assistance: Supervision Gait Distance (Feet): 120 Feet Assistive device: Rolling walker (2 wheeled) Gait Pattern/deviations: Step-to pattern;Decreased stride length Gait velocity: decr    General Gait Details: VCs sequencing, no LOB, 7-8/10 pain LLE walking   Stairs Stairs: Yes Stairs assistance: Min guard Stair Management: No rails;Step to pattern;Backwards;With walker Number of Stairs: 1 General stair comments: VCs sequencing, min/guard safety, practiced 1 step x 2 trials   Wheelchair Mobility    Modified Rankin (Stroke Patients  Only)       Balance Overall balance assessment: Mild deficits observed, not formally tested                                          Cognition Arousal/Alertness: Awake/alert Behavior During Therapy: WFL for tasks assessed/performed Overall Cognitive Status: Within Functional Limits for tasks assessed                                        Exercises Total Joint Exercises Ankle Circles/Pumps: AROM;Both;10 reps;Supine Quad Sets: AROM;Left;5 reps;Supine Short Arc Quad: AAROM;Left;10 reps;Supine Heel Slides: AAROM;Left;10 reps;Supine Hip ABduction/ADduction: AAROM;Left;10 reps;Supine Straight Leg Raises: AAROM;Left;10 reps;Supine;AROM Long Arc Quad: AAROM;Left;5 reps;Seated Knee Flexion: AAROM;AROM;Left;Seated;5 reps Goniometric ROM: 5-50* AAROM L knee    General Comments        Pertinent Vitals/Pain Pain Score: 7  Pain Location: L knee while walking Pain Descriptors / Indicators: Sharp Pain Intervention(s): Limited activity within patient's tolerance;Monitored during session;Premedicated before session;Ice applied    Home Living Family/patient expects to be discharged to:: Private residence Living Arrangements: Spouse/significant other Available Help at Discharge: Family;Available PRN/intermittently         Home Equipment: Shower seat - built in      Prior Function Level of Independence: Independent          PT Goals (current goals can now be found in the care plan section) Acute Rehab PT Goals Patient Stated Goal: none stated  PT  Goal Formulation: With patient Time For Goal Achievement: 09/14/18 Potential to Achieve Goals: Good Progress towards PT goals: Progressing toward goals    Frequency    7X/week      PT Plan Current plan remains appropriate    Co-evaluation              AM-PAC PT "6 Clicks" Daily Activity  Outcome Measure  Difficulty turning over in bed (including adjusting bedclothes, sheets and  blankets)?: A Little Difficulty moving from lying on back to sitting on the side of the bed? : A Little Difficulty sitting down on and standing up from a chair with arms (e.g., wheelchair, bedside commode, etc,.)?: A Little Help needed moving to and from a bed to chair (including a wheelchair)?: A Little Help needed walking in hospital room?: A Little Help needed climbing 3-5 steps with a railing? : A Little 6 Click Score: 18    End of Session Equipment Utilized During Treatment: Gait belt Activity Tolerance: Patient tolerated treatment well Patient left: in chair;with call bell/phone within reach;with family/visitor present(Pt agrees to press call bell button and wait for assistance before mobilizing. family in room. ) Nurse Communication: Mobility status PT Visit Diagnosis: Other abnormalities of gait and mobility (R26.89);Difficulty in walking, not elsewhere classified (R26.2)     Time: 2482-5003 PT Time Calculation (min) (ACUTE ONLY): 38 min  Charges:  $Gait Training: 8-22 mins $Therapeutic Exercise: 8-22 mins $Therapeutic Activity: 8-22 mins                     Blondell Reveal Kistler PT 09/08/2018  Acute Rehabilitation Services Pager [phone] Office [phone]

## 2018-09-09 ENCOUNTER — Telehealth: Payer: Self-pay | Admitting: *Deleted

## 2018-09-09 DIAGNOSIS — M25662 Stiffness of left knee, not elsewhere classified: Secondary | ICD-10-CM | POA: Diagnosis not present

## 2018-09-09 DIAGNOSIS — M6281 Muscle weakness (generalized): Secondary | ICD-10-CM | POA: Diagnosis not present

## 2018-09-09 DIAGNOSIS — R262 Difficulty in walking, not elsewhere classified: Secondary | ICD-10-CM | POA: Diagnosis not present

## 2018-09-09 DIAGNOSIS — M25562 Pain in left knee: Secondary | ICD-10-CM | POA: Diagnosis not present

## 2018-09-09 NOTE — Telephone Encounter (Signed)
Pt was on TCM report was admitted 09/07/2018 with a diagnosis of Primary osteoarthritis of left knee and went to the operating room on 09/07/2018 and underwent UNICOMPARTMENTAL of  LEFT KNEE. Pt D/C 09/08/18, and will follow-up w/specialist../lmb

## 2018-09-13 DIAGNOSIS — M6281 Muscle weakness (generalized): Secondary | ICD-10-CM | POA: Diagnosis not present

## 2018-09-13 DIAGNOSIS — R262 Difficulty in walking, not elsewhere classified: Secondary | ICD-10-CM | POA: Diagnosis not present

## 2018-09-13 DIAGNOSIS — M25562 Pain in left knee: Secondary | ICD-10-CM | POA: Diagnosis not present

## 2018-09-13 DIAGNOSIS — M25662 Stiffness of left knee, not elsewhere classified: Secondary | ICD-10-CM | POA: Diagnosis not present

## 2018-09-15 DIAGNOSIS — M25662 Stiffness of left knee, not elsewhere classified: Secondary | ICD-10-CM | POA: Diagnosis not present

## 2018-09-15 DIAGNOSIS — M6281 Muscle weakness (generalized): Secondary | ICD-10-CM | POA: Diagnosis not present

## 2018-09-15 DIAGNOSIS — M25562 Pain in left knee: Secondary | ICD-10-CM | POA: Diagnosis not present

## 2018-09-15 DIAGNOSIS — R262 Difficulty in walking, not elsewhere classified: Secondary | ICD-10-CM | POA: Diagnosis not present

## 2018-09-17 DIAGNOSIS — M25562 Pain in left knee: Secondary | ICD-10-CM | POA: Diagnosis not present

## 2018-09-17 DIAGNOSIS — M25662 Stiffness of left knee, not elsewhere classified: Secondary | ICD-10-CM | POA: Diagnosis not present

## 2018-09-17 DIAGNOSIS — R262 Difficulty in walking, not elsewhere classified: Secondary | ICD-10-CM | POA: Diagnosis not present

## 2018-09-17 DIAGNOSIS — M6281 Muscle weakness (generalized): Secondary | ICD-10-CM | POA: Diagnosis not present

## 2018-09-20 DIAGNOSIS — M25662 Stiffness of left knee, not elsewhere classified: Secondary | ICD-10-CM | POA: Diagnosis not present

## 2018-09-20 DIAGNOSIS — R262 Difficulty in walking, not elsewhere classified: Secondary | ICD-10-CM | POA: Diagnosis not present

## 2018-09-20 DIAGNOSIS — M6281 Muscle weakness (generalized): Secondary | ICD-10-CM | POA: Diagnosis not present

## 2018-09-20 DIAGNOSIS — M25562 Pain in left knee: Secondary | ICD-10-CM | POA: Diagnosis not present

## 2018-09-21 ENCOUNTER — Other Ambulatory Visit: Payer: Self-pay | Admitting: Internal Medicine

## 2018-09-22 DIAGNOSIS — M6281 Muscle weakness (generalized): Secondary | ICD-10-CM | POA: Diagnosis not present

## 2018-09-22 DIAGNOSIS — R262 Difficulty in walking, not elsewhere classified: Secondary | ICD-10-CM | POA: Diagnosis not present

## 2018-09-22 DIAGNOSIS — M25662 Stiffness of left knee, not elsewhere classified: Secondary | ICD-10-CM | POA: Diagnosis not present

## 2018-09-22 DIAGNOSIS — M25562 Pain in left knee: Secondary | ICD-10-CM | POA: Diagnosis not present

## 2018-09-27 ENCOUNTER — Encounter: Payer: Self-pay | Admitting: Obstetrics & Gynecology

## 2018-09-27 ENCOUNTER — Ambulatory Visit: Payer: BLUE CROSS/BLUE SHIELD | Admitting: Obstetrics & Gynecology

## 2018-09-27 VITALS — BP 126/84

## 2018-09-27 DIAGNOSIS — N951 Menopausal and female climacteric states: Secondary | ICD-10-CM

## 2018-09-27 DIAGNOSIS — M6281 Muscle weakness (generalized): Secondary | ICD-10-CM | POA: Diagnosis not present

## 2018-09-27 DIAGNOSIS — M25662 Stiffness of left knee, not elsewhere classified: Secondary | ICD-10-CM | POA: Diagnosis not present

## 2018-09-27 DIAGNOSIS — R262 Difficulty in walking, not elsewhere classified: Secondary | ICD-10-CM | POA: Diagnosis not present

## 2018-09-27 DIAGNOSIS — M25562 Pain in left knee: Secondary | ICD-10-CM | POA: Diagnosis not present

## 2018-09-27 MED ORDER — ESTRADIOL 0.05 MG/24HR TD PTTW: 1.0000 | MEDICATED_PATCH | TRANSDERMAL | 4 refills | Status: DC

## 2018-09-27 NOTE — Patient Instructions (Signed)
1. Menopausal syndrome Very symptomatic menopause off estradiol patch.  Would like to restart at the same dosage of estradiol 0.05.  No contraindication to restart on hormone replacement therapy.  Was on hormone replacement therapy for 7 to 8 years before stopping.  Had an left knee surgery 3 weeks ago with no complication and has resumed physical activities.  Decision to prescribe estradiol 0.05 twice weekly.  Usage reviewed and prescription sent to pharmacy.  Will schedule annual gynecologic exam.  Other orders - estradiol (VIVELLE-DOT) 0.05 MG/24HR patch; Place 1 patch (0.05 mg total) onto the skin 2 (two) times a week.  [name], good seeing you today!

## 2018-09-27 NOTE — Progress Notes (Signed)
    Beth Huffman 12-May-1961 [id number]        57 y.o.  G2P2L2 Married  RP: Menopausal syndrome off HRT  HPI: S/P Vaginal Hysterectomy.  Was well on Estradiol 0.05 patch, but stopped to see how would feel off Hormones.  Since then, gained weight, doesn't feel good and is experiencing hot flushes and night sweats.  Wants to start back on HRT.  No CI to HRT.  Had been on HRT x about 7-8 yrs.  Had a Left knee surgery 3 weeks ago, no Cx and has resumed physical activities.   OB History  Gravida Para Term Preterm AB Living  2 2       2   SAB TAB Ectopic Multiple Live Births               # Outcome Date GA Lbr Len/2nd Weight Sex Delivery Anes PTL Lv  2 Para           1 Para             Past medical history,surgical history, problem list, medications, allergies, family history and social history were all reviewed and documented in the EPIC chart.   Directed ROS with pertinent positives and negatives documented in the history of present illness/assessment and plan.  Exam:  Vitals:   09/27/18 1003  BP: 126/84   General appearance:  Normal  Gyn exam Deferred    Assessment/Plan:  57 y.o. G2P2   1. Menopausal syndrome Very symptomatic menopause off estradiol patch.  Would like to restart at the same dosage of estradiol 0.05.  No contraindication to restart on hormone replacement therapy.  Was on hormone replacement therapy for 7 to 8 years before stopping.  Had an left knee surgery 3 weeks ago with no complication and has resumed physical activities.  Decision to prescribe estradiol 0.05 twice weekly.  Usage reviewed and prescription sent to pharmacy.  Will schedule annual gynecologic exam.  Other orders - estradiol (VIVELLE-DOT) 0.05 MG/24HR patch; Place 1 patch (0.05 mg total) onto the skin 2 (two) times a week.  Counseling on above issues and coordination of care more than 50% for 25 minutes.  Princess Bruins MD, 10:10 AM 09/27/2018

## 2018-09-29 DIAGNOSIS — M25662 Stiffness of left knee, not elsewhere classified: Secondary | ICD-10-CM | POA: Diagnosis not present

## 2018-09-29 DIAGNOSIS — R262 Difficulty in walking, not elsewhere classified: Secondary | ICD-10-CM | POA: Diagnosis not present

## 2018-09-29 DIAGNOSIS — M25562 Pain in left knee: Secondary | ICD-10-CM | POA: Diagnosis not present

## 2018-09-29 DIAGNOSIS — M6281 Muscle weakness (generalized): Secondary | ICD-10-CM | POA: Diagnosis not present

## 2018-10-01 DIAGNOSIS — M25562 Pain in left knee: Secondary | ICD-10-CM | POA: Diagnosis not present

## 2018-10-01 DIAGNOSIS — R262 Difficulty in walking, not elsewhere classified: Secondary | ICD-10-CM | POA: Diagnosis not present

## 2018-10-01 DIAGNOSIS — M25662 Stiffness of left knee, not elsewhere classified: Secondary | ICD-10-CM | POA: Diagnosis not present

## 2018-10-01 DIAGNOSIS — M6281 Muscle weakness (generalized): Secondary | ICD-10-CM | POA: Diagnosis not present

## 2018-10-04 DIAGNOSIS — M6281 Muscle weakness (generalized): Secondary | ICD-10-CM | POA: Diagnosis not present

## 2018-10-04 DIAGNOSIS — M25662 Stiffness of left knee, not elsewhere classified: Secondary | ICD-10-CM | POA: Diagnosis not present

## 2018-10-04 DIAGNOSIS — R262 Difficulty in walking, not elsewhere classified: Secondary | ICD-10-CM | POA: Diagnosis not present

## 2018-10-04 DIAGNOSIS — M25562 Pain in left knee: Secondary | ICD-10-CM | POA: Diagnosis not present

## 2018-10-05 ENCOUNTER — Other Ambulatory Visit: Payer: Self-pay | Admitting: Internal Medicine

## 2018-10-08 DIAGNOSIS — R262 Difficulty in walking, not elsewhere classified: Secondary | ICD-10-CM | POA: Diagnosis not present

## 2018-10-08 DIAGNOSIS — M6281 Muscle weakness (generalized): Secondary | ICD-10-CM | POA: Diagnosis not present

## 2018-10-08 DIAGNOSIS — M25662 Stiffness of left knee, not elsewhere classified: Secondary | ICD-10-CM | POA: Diagnosis not present

## 2018-10-08 DIAGNOSIS — M25562 Pain in left knee: Secondary | ICD-10-CM | POA: Diagnosis not present

## 2018-10-13 DIAGNOSIS — R262 Difficulty in walking, not elsewhere classified: Secondary | ICD-10-CM | POA: Diagnosis not present

## 2018-10-13 DIAGNOSIS — M25662 Stiffness of left knee, not elsewhere classified: Secondary | ICD-10-CM | POA: Diagnosis not present

## 2018-10-13 DIAGNOSIS — M6281 Muscle weakness (generalized): Secondary | ICD-10-CM | POA: Diagnosis not present

## 2018-10-13 DIAGNOSIS — M25562 Pain in left knee: Secondary | ICD-10-CM | POA: Diagnosis not present

## 2018-10-15 ENCOUNTER — Ambulatory Visit: Payer: BLUE CROSS/BLUE SHIELD

## 2018-10-22 DIAGNOSIS — M6281 Muscle weakness (generalized): Secondary | ICD-10-CM | POA: Diagnosis not present

## 2018-10-22 DIAGNOSIS — R262 Difficulty in walking, not elsewhere classified: Secondary | ICD-10-CM | POA: Diagnosis not present

## 2018-10-22 DIAGNOSIS — M25562 Pain in left knee: Secondary | ICD-10-CM | POA: Diagnosis not present

## 2018-10-22 DIAGNOSIS — M25662 Stiffness of left knee, not elsewhere classified: Secondary | ICD-10-CM | POA: Diagnosis not present

## 2018-11-03 ENCOUNTER — Ambulatory Visit (INDEPENDENT_AMBULATORY_CARE_PROVIDER_SITE_OTHER): Payer: BLUE CROSS/BLUE SHIELD | Admitting: Internal Medicine

## 2018-11-03 ENCOUNTER — Encounter: Payer: Self-pay | Admitting: Internal Medicine

## 2018-11-03 DIAGNOSIS — G43719 Chronic migraine without aura, intractable, without status migrainosus: Secondary | ICD-10-CM | POA: Diagnosis not present

## 2018-11-03 DIAGNOSIS — G8929 Other chronic pain: Secondary | ICD-10-CM

## 2018-11-03 DIAGNOSIS — M5442 Lumbago with sciatica, left side: Secondary | ICD-10-CM | POA: Diagnosis not present

## 2018-11-03 DIAGNOSIS — G950 Syringomyelia and syringobulbia: Secondary | ICD-10-CM | POA: Diagnosis not present

## 2018-11-03 DIAGNOSIS — M5441 Lumbago with sciatica, right side: Secondary | ICD-10-CM

## 2018-11-03 MED ORDER — OXYCODONE HCL 10 MG PO TABS: 10.0000 mg | ORAL_TABLET | Freq: Four times a day (QID) | ORAL | 0 refills | Status: DC | PRN

## 2018-11-03 NOTE — Assessment & Plan Note (Signed)
Resolved on Gabapentin and later on Nortriptyline

## 2018-11-03 NOTE — Assessment & Plan Note (Signed)
On Oxycodone  Potential benefits of a long term opioids use as well as potential risks (i.e. addiction risk, apnea etc) and complications (i.e. Somnolence, constipation and others) were explained to the patient and were aknowledged.  

## 2018-11-03 NOTE — Assessment & Plan Note (Signed)
Oxy and Nortriptyline

## 2018-11-03 NOTE — Progress Notes (Signed)
Subjective:  Patient ID: Beth Huffman, female    DOB: May 28, 1961  Age: 58 y.o. MRN: [id number]  CC: No chief complaint on file.   HPI ABBRIELLE BATTS presents for s/p R TKR F/u migraines - resolved on Gabapentin - off; and nortriptyline  S/p L TKR - better F/u chronic pain   Outpatient Medications Prior to Visit  Medication Sig Dispense Refill  . cholecalciferol (VITAMIN D) 1000 UNITS tablet Take 1,000 Units by mouth daily.      . cyclobenzaprine (FLEXERIL) 5 MG tablet TAKE 1 TABLET THREE TIMES DAILY AS NEEDED FOR MUSCLE SPASMS. (Patient taking differently: Take 5 mg by mouth 3 (three) times daily as needed for muscle spasms. ) 90 tablet 0  . cyclobenzaprine (FLEXERIL) 5 MG tablet TAKE 1 TABLET THREE TIMES DAILY AS NEEDED FOR MUSCLE SPASMS. 90 tablet 0  . docusate sodium (COLACE) 250 MG capsule Take 500 mg by mouth every other day.    . estradiol (VIVELLE-DOT) 0.05 MG/24HR patch Place 1 patch (0.05 mg total) onto the skin 2 (two) times a week. 24 patch 4  . Galcanezumab-gnlm (EMGALITY) 120 MG/ML SOAJ Inject 120 mg into the skin every 30 (thirty) days. For migraine prophylaxis 1 pen 11  . ibuprofen (ADVIL,MOTRIN) 600 MG tablet TAKE 1 TABLET BY MOUTH 3 TIMES A DAY. 90 tablet 0  . Multiple Vitamin (MULTIVITAMIN PO) Take 1 tablet by mouth daily.      . ondansetron (ZOFRAN) 8 MG tablet TAKE 1 TABLET EVERY 12 HOURS IF NEEDED FOR NAUSEA. (Patient taking differently: Take 8 mg by mouth every 12 (twelve) hours as needed for nausea or vomiting. ) 20 tablet 11  . pantoprazole (PROTONIX) 40 MG tablet TAKE 1 TABLET EACH DAY. (Patient taking differently: Take 40 mg by mouth daily. ) 30 tablet 11  . polyethylene glycol (MIRALAX / GLYCOLAX) packet Take 8.5 g by mouth every other day.    . rizatriptan (MAXALT) 10 MG tablet Take 1 tablet (10 mg total) by mouth once as needed for up to 1 dose for migraine. May repeat in 2 hours if needed 8 tablet 12  . SUMAtriptan (IMITREX) 100 MG tablet Take 100 mg  by mouth every 2 (two) hours as needed for migraine. May repeat in 2 hours if headache persists or recurs.    . topiramate (TOPAMAX) 200 MG tablet TAKE 1 TABLET BY MOUTH TWICE DAILY. (Patient taking differently: Take 200 mg by mouth at bedtime. ) 60 tablet 11  . Topiramate ER (TROKENDI XR) 200 MG CP24 Take 200 mg by mouth at bedtime. Please notice dosage change, now only 200mg  at night 90 capsule 4  . verapamil (CALAN-SR) 120 MG CR tablet TAKE ONE TABLET AT BEDTIME. (Patient taking differently: Take 120 mg by mouth at bedtime. ) 30 tablet 10  . gabapentin (NEURONTIN) 300 MG capsule Take 1 capsule (300 mg total) by mouth 2 (two) times daily for 14 days. For 2 weeks post op for pain. 28 capsule 0  . ibuprofen (ADVIL,MOTRIN) 600 MG tablet TAKE 1 TABLET BY MOUTH 3 TIMES A DAY. (Patient not taking: Reported on 11/03/2018) 90 tablet 1   No facility-administered medications prior to visit.     ROS: Review of Systems  Constitutional: Negative.  Negative for activity change, appetite change, chills, diaphoresis, fatigue, fever and unexpected weight change.  HENT: Negative for congestion, ear pain, facial swelling, hearing loss, mouth sores, nosebleeds, postnasal drip, rhinorrhea, sinus pressure, sneezing, sore throat, tinnitus and trouble swallowing.   Eyes:  Negative for pain, discharge, redness, itching and visual disturbance.  Respiratory: Negative for cough, chest tightness, shortness of breath, wheezing and stridor.   Cardiovascular: Negative for chest pain, palpitations and leg swelling.  Gastrointestinal: Negative for abdominal distention, anal bleeding, blood in stool, constipation, diarrhea, nausea and rectal pain.  Genitourinary: Negative for difficulty urinating, dysuria, flank pain, frequency, genital sores, hematuria, pelvic pain, urgency, vaginal bleeding and vaginal discharge.  Musculoskeletal: Positive for back pain and gait problem. Negative for arthralgias, joint swelling, neck pain and neck  stiffness.  Skin: Negative.  Negative for rash.  Neurological: Negative for dizziness, tremors, seizures, syncope, speech difficulty, weakness, numbness and headaches.  Hematological: Negative for adenopathy. Does not bruise/bleed easily.  Psychiatric/Behavioral: Negative for behavioral problems, decreased concentration, dysphoric mood, sleep disturbance and suicidal ideas. The patient is nervous/anxious.     Objective:  BP 132/84 (BP Location: Left Arm, Patient Position: Sitting, Cuff Size: Normal)   Pulse 88   Temp 98.2 F (36.8 C) (Oral)   Ht 5\' 5"  (1.651 m)   Wt 175 lb (79.4 kg)   SpO2 98%   BMI 29.12 kg/m   BP Readings from Last 3 Encounters:  11/03/18 132/84  09/27/18 126/84  09/08/18 122/66    Wt Readings from Last 3 Encounters:  11/03/18 175 lb (79.4 kg)  09/07/18 179 lb 4 oz (81.3 kg)  08/31/18 179 lb 4 oz (81.3 kg)    Physical Exam Constitutional:      General: She is not in acute distress.    Appearance: She is well-developed.  HENT:     Head: Normocephalic.     Right Ear: External ear normal.     Left Ear: External ear normal.     Nose: Nose normal.  Eyes:     General:        Right eye: No discharge.        Left eye: No discharge.     Conjunctiva/sclera: Conjunctivae normal.     Pupils: Pupils are equal, round, and reactive to light.  Neck:     Musculoskeletal: Normal range of motion and neck supple.     Thyroid: No thyromegaly.     Vascular: No JVD.     Trachea: No tracheal deviation.  Cardiovascular:     Rate and Rhythm: Normal rate and regular rhythm.     Heart sounds: Normal heart sounds.  Pulmonary:     Effort: No respiratory distress.     Breath sounds: No stridor. No wheezing.  Abdominal:     General: Bowel sounds are normal. There is no distension.     Palpations: Abdomen is soft. There is no mass.     Tenderness: There is no abdominal tenderness. There is no guarding or rebound.  Musculoskeletal:        General: Tenderness present.    Lymphadenopathy:     Cervical: No cervical adenopathy.  Skin:    Findings: No erythema or rash.  Neurological:     Cranial Nerves: No cranial nerve deficit.     Motor: No abnormal muscle tone.     Coordination: Coordination normal.     Deep Tendon Reflexes: Reflexes normal.  Psychiatric:        Behavior: Behavior normal.        Thought Content: Thought content normal.        Judgment: Judgment normal.   LS w/pain R knee w/post-op swelling  Lab Results  Component Value Date   WBC 4.1 08/31/2018   HGB 13.2 08/31/2018  HCT 41.3 08/31/2018   PLT 213 08/31/2018   GLUCOSE 87 08/31/2018   CHOL 189 05/04/2017   TRIG 129.0 05/04/2017   HDL 52.40 05/04/2017   LDLDIRECT 130.2 12/21/2013   LDLCALC 111 (H) 05/04/2017   ALT 35 08/03/2018   AST 29 08/03/2018   NA 142 08/31/2018   K 3.8 08/31/2018   CL 111 08/31/2018   CREATININE 0.98 08/31/2018   BUN 16 08/31/2018   CO2 24 08/31/2018   TSH 1.26 03/31/2018   HGBA1C 5.5 03/31/2018    No results found.  Assessment & Plan:   There are no diagnoses linked to this encounter.   No orders of the defined types were placed in this encounter.    Follow-up: No follow-ups on file.  Walker Kehr, MD

## 2018-11-05 DIAGNOSIS — M25562 Pain in left knee: Secondary | ICD-10-CM | POA: Diagnosis not present

## 2018-11-09 NOTE — Telephone Encounter (Signed)
Copied from [place] [id number]. Topic: General - Inquiry >> Nov 09, 2018  9:17 AM Sheran Luz wrote: Reason for CRM: Patient called inquiring about parking pass she has requested. See patient message from 01/08. She states it was also discussed on OV. Please advise.

## 2018-11-19 ENCOUNTER — Ambulatory Visit: Payer: BLUE CROSS/BLUE SHIELD

## 2018-11-23 ENCOUNTER — Other Ambulatory Visit: Payer: Self-pay | Admitting: Neurology

## 2018-11-25 ENCOUNTER — Other Ambulatory Visit: Payer: Self-pay | Admitting: Internal Medicine

## 2018-11-25 ENCOUNTER — Telehealth: Payer: Self-pay | Admitting: Neurology

## 2018-11-25 NOTE — Telephone Encounter (Signed)
Returned call to the patient.  Her PCP has been providing her refills on topiramate and rizatriptan.  She is going to request he also refill her sumatriptan.  She does not wish to restart Botox at this time and would like to cancel the follow up on 12/23/2018 with Dr. Krista Blue.  She sees her PCP every three months and will discuss continuing the refills with him.

## 2018-11-25 NOTE — Telephone Encounter (Signed)
Dr. Krista Blue said the appointment can be changed to Botox that day, if insurance will allow coverage.

## 2018-11-25 NOTE — Telephone Encounter (Signed)
Sharyn Lull, could she be injected on this day?

## 2018-11-25 NOTE — Telephone Encounter (Signed)
Patient requesting to schedule her Botox with Dr Krista Blue. She has scheduled a f/u office visit for 12/23/2018 in order to have her medication refilled. Please call her. She also will have a change in her insurance in February.

## 2018-11-25 NOTE — Telephone Encounter (Signed)
Patient calling for refills of SUMAtriptan (IMITREX)  100 MG tablet and also her Maxalt. Please send both to [facility]. She has scheduled a follow up visit with Dr Krista Blue for 12/23/2018

## 2018-11-26 NOTE — Telephone Encounter (Signed)
Noted, will change apt. DW

## 2018-12-06 ENCOUNTER — Other Ambulatory Visit: Payer: Self-pay | Admitting: Internal Medicine

## 2018-12-07 ENCOUNTER — Ambulatory Visit (INDEPENDENT_AMBULATORY_CARE_PROVIDER_SITE_OTHER): Payer: PPO | Admitting: Obstetrics & Gynecology

## 2018-12-07 ENCOUNTER — Encounter: Payer: Self-pay | Admitting: Obstetrics & Gynecology

## 2018-12-07 VITALS — BP 126/86 | Ht 63.5 in | Wt 166.0 lb

## 2018-12-07 DIAGNOSIS — Z01419 Encounter for gynecological examination (general) (routine) without abnormal findings: Secondary | ICD-10-CM

## 2018-12-07 DIAGNOSIS — E663 Overweight: Secondary | ICD-10-CM

## 2018-12-07 DIAGNOSIS — Z90710 Acquired absence of both cervix and uterus: Secondary | ICD-10-CM

## 2018-12-07 DIAGNOSIS — Z79890 Hormone replacement therapy (postmenopausal): Secondary | ICD-10-CM

## 2018-12-07 MED ORDER — ESTRADIOL 0.05 MG/24HR TD PTTW: 1.0000 | MEDICATED_PATCH | TRANSDERMAL | 4 refills | Status: DC

## 2018-12-07 NOTE — Progress Notes (Signed)
Beth Huffman Mar 05, 1961 [id number]   History:    58 y.o. G2P2L2 Married.  Daughter 65 yo, son 23 yo.  2 grand-children.  RP:  Established patient presenting for annual gyn exam   HPI: Menopause, well on Estradiol Patch 0.05 currently.  No Menopausal Sx.  S/P Vaginal Hyst in 90's and Sacrocolpopexy/BSO/Ant Repair/Perineoplasty/TVT 06/2016.  Rare mild urgency.  No SUI.  No pelvic pain. Feels that suspension is very good.  Breasts wnl.  Loosing weight on a low calorie diet.  BMI 28.94.  Walking the dog.  Health labs with Fam MD.  Past medical history,surgical history, family history and social history were all reviewed and documented in the EPIC chart.  Gynecologic History No LMP recorded. Patient is postmenopausal. Contraception: status post hysterectomy Last Pap: 02/2017. Results were: Negative. Last mammogram: 01/2017. Results were: Negative.   Scheduled 11/2018. Bone Density: 2004 Normal Colonoscopy: 2017  Obstetric History OB History  Gravida Para Term Preterm AB Living  2 2       2   SAB TAB Ectopic Multiple Live Births               # Outcome Date GA Lbr Len/2nd Weight Sex Delivery Anes PTL Lv  2 Para           1 Para              ROS: A ROS was performed and pertinent positives and negatives are included in the history.  GENERAL: No fevers or chills. HEENT: No change in vision, no earache, sore throat or sinus congestion. NECK: No pain or stiffness. CARDIOVASCULAR: No chest pain or pressure. No palpitations. PULMONARY: No shortness of breath, cough or wheeze. GASTROINTESTINAL: No abdominal pain, nausea, vomiting or diarrhea, melena or bright red blood per rectum. GENITOURINARY: No urinary frequency, urgency, hesitancy or dysuria. MUSCULOSKELETAL: No joint or muscle pain, no back pain, no recent trauma. DERMATOLOGIC: No rash, no itching, no lesions. ENDOCRINE: No polyuria, polydipsia, no heat or cold intolerance. No recent change in weight. HEMATOLOGICAL: No anemia or easy  bruising or bleeding. NEUROLOGIC: No headache, seizures, numbness, tingling or weakness. PSYCHIATRIC: No depression, no loss of interest in normal activity or change in sleep pattern.     Exam:   BP 126/86   Ht 5' 3.5" (1.613 m)   Wt 166 lb (75.3 kg)   BMI 28.94 kg/m   Body mass index is 28.94 kg/m.  General appearance : Well developed well nourished female. No acute distress HEENT: Eyes: no retinal hemorrhage or exudates,  Neck supple, trachea midline, no carotid bruits, no thyroidmegaly Lungs: Clear to auscultation, no rhonchi or wheezes, or rib retractions  Heart: Regular rate and rhythm, no murmurs or gallops Breast:Examined in sitting and supine position were symmetrical in appearance, no palpable masses or tenderness,  no skin retraction, no nipple inversion, no nipple discharge, no skin discoloration, no axillary or supraclavicular lymphadenopathy Abdomen: no palpable masses or tenderness, no rebound or guarding Extremities: no edema or skin discoloration or tenderness  Pelvic: Vulva: Normal             Vagina: No gross lesions or discharge  Cervix/Uterus absent  Adnexa  Without masses or tenderness  Anus: Normal   Assessment/Plan:  58 y.o. female for annual exam   1. Well female exam with routine gynecological exam Gynecologic exam status post total hysterectomy in menopause.  Last Pap test May 2018 was negative, no indication to repeat a Pap test this year.  Breast  exam normal.  Last screening mammogram 01/2017 was negative, screening mammogram scheduled this month.  Last colonoscopy in 2017.  Health labs with family physician.  2. S/P total hysterectomy  3. Post-menopause on HRT (hormone replacement therapy) Postmenopausal well on hormone replacement therapy with estradiol patch 0.05 twice weekly.  Status post total hysterectomy.  No contraindication to continue on hormone replacement therapy.  Re-prescription sent to pharmacy.  Recommend vitamin D supplements, calcium  intake of 1.5 g/day including nutritional and supplemental and regular weightbearing physical activities.  4. Overweight (BMI 25.0-29.9) Loosing weight on a low calorie diet.  Recommend aerobic physical activities 5 times a week and weightlifting every 2 days.  Other orders - estradiol (VIVELLE-DOT) 0.05 MG/24HR patch; Place 1 patch (0.05 mg total) onto the skin 2 (two) times a week.  Princess Bruins MD, 3:17 PM 12/07/2018

## 2018-12-10 ENCOUNTER — Encounter: Payer: Self-pay | Admitting: Obstetrics & Gynecology

## 2018-12-10 NOTE — Patient Instructions (Signed)
1. Well female exam with routine gynecological exam Gynecologic exam status post total hysterectomy in menopause.  Last Pap test May 2018 was negative, no indication to repeat a Pap test this year.  Breast exam normal.  Last screening mammogram 01/2017 was negative, screening mammogram scheduled this month.  Last colonoscopy in 2017.  Health labs with family physician.  2. S/P total hysterectomy  3. Post-menopause on HRT (hormone replacement therapy) Postmenopausal well on hormone replacement therapy with estradiol patch 0.05 twice weekly.  Status post total hysterectomy.  No contraindication to continue on hormone replacement therapy.  Re-prescription sent to pharmacy.  Recommend vitamin D supplements, calcium intake of 1.5 g/day including nutritional and supplemental and regular weightbearing physical activities.  4. Overweight (BMI 25.0-29.9) Loosing weight on a low calorie diet.  Recommend aerobic physical activities 5 times a week and weightlifting every 2 days.  Other orders - estradiol (VIVELLE-DOT) 0.05 MG/24HR patch; Place 1 patch (0.05 mg total) onto the skin 2 (two) times a week.  Beth Huffman, it was a pleasure seeing you today!

## 2018-12-14 ENCOUNTER — Telehealth: Payer: Self-pay | Admitting: Internal Medicine

## 2018-12-14 NOTE — Telephone Encounter (Signed)
Request for nortriptyline; not on pt's medication list; also see CRM # [phone]; pt last seen by Dr Alain Marion, [facility], 11/03/2018;  will route to office for final disposition.

## 2018-12-14 NOTE — Telephone Encounter (Signed)
Copied from [place] [id number]. Topic: Quick Communication - Rx Refill/Question >> Dec 14, 2018  2:10 PM Reyne Dumas L wrote: Medication: Nortriptyline HCl 10mg   Has the patient contacted their pharmacy? No - states that at last visit she discussed having PCP fill this since she is in the process of looking for a new neurologist. (Agent: If no, request that the patient contact the pharmacy for the refill.) (Agent: If yes, when and what did the pharmacy advise?)  Preferred Pharmacy (with phone number or street name): [place], [place]. [phone] (Phone) [phone] (Fax)  Agent: Please be advised that RX refills may take up to 3 business days. We ask that you follow-up with your pharmacy.

## 2018-12-15 ENCOUNTER — Other Ambulatory Visit: Payer: Self-pay

## 2018-12-15 MED ORDER — NORTRIPTYLINE HCL 10 MG PO CAPS: 20.0000 mg | ORAL_CAPSULE | Freq: Every day | ORAL | 11 refills | Status: DC

## 2018-12-15 NOTE — Telephone Encounter (Signed)
Please advise 

## 2018-12-15 NOTE — Telephone Encounter (Signed)
Ok. Done 

## 2018-12-21 ENCOUNTER — Ambulatory Visit
Admission: RE | Admit: 2018-12-21 | Discharge: 2018-12-21 | Disposition: A | Discharge status: Home / Self Care | Payer: PPO | Source: Ambulatory Visit | Attending: Obstetrics & Gynecology | Admitting: Obstetrics & Gynecology

## 2018-12-21 DIAGNOSIS — Z1231 Encounter for screening mammogram for malignant neoplasm of breast: Secondary | ICD-10-CM

## 2018-12-23 ENCOUNTER — Ambulatory Visit: Payer: BLUE CROSS/BLUE SHIELD | Admitting: Neurology

## 2018-12-27 ENCOUNTER — Encounter: Payer: Self-pay | Admitting: Internal Medicine

## 2018-12-27 ENCOUNTER — Ambulatory Visit (INDEPENDENT_AMBULATORY_CARE_PROVIDER_SITE_OTHER): Payer: PPO | Admitting: Internal Medicine

## 2018-12-27 DIAGNOSIS — G950 Syringomyelia and syringobulbia: Secondary | ICD-10-CM

## 2018-12-27 DIAGNOSIS — G43719 Chronic migraine without aura, intractable, without status migrainosus: Secondary | ICD-10-CM

## 2018-12-27 DIAGNOSIS — M5441 Lumbago with sciatica, right side: Secondary | ICD-10-CM

## 2018-12-27 DIAGNOSIS — M5442 Lumbago with sciatica, left side: Secondary | ICD-10-CM | POA: Diagnosis not present

## 2018-12-27 DIAGNOSIS — G8929 Other chronic pain: Secondary | ICD-10-CM | POA: Diagnosis not present

## 2018-12-27 DIAGNOSIS — M542 Cervicalgia: Secondary | ICD-10-CM | POA: Diagnosis not present

## 2018-12-27 DIAGNOSIS — R635 Abnormal weight gain: Secondary | ICD-10-CM

## 2018-12-27 MED ORDER — OXYCODONE HCL 10 MG PO TABS: 10.0000 mg | ORAL_TABLET | Freq: Four times a day (QID) | ORAL | 0 refills | Status: DC | PRN

## 2018-12-27 MED ORDER — OSELTAMIVIR PHOSPHATE 75 MG PO CAPS: 75.0000 mg | ORAL_CAPSULE | Freq: Every day | ORAL | 0 refills | Status: DC

## 2018-12-27 NOTE — Assessment & Plan Note (Signed)
Wt Readings from Last 3 Encounters:  12/27/18 165 lb (74.8 kg)  12/07/18 166 lb (75.3 kg)  11/03/18 175 lb (79.4 kg)

## 2018-12-27 NOTE — Assessment & Plan Note (Signed)
Botox. 

## 2018-12-27 NOTE — Assessment & Plan Note (Signed)
  Chronic, lately (2016) disabling Due to syringomyelia On Oxycodone  Potential benefits of a long term opioids use as well as potential risks (i.e. addiction risk, apnea etc) and complications (i.e. Somnolence, constipation and others) were explained to the patient and were aknowledged.

## 2018-12-27 NOTE — Assessment & Plan Note (Signed)
Chronic, lately (2016) disabling Due to syringomyelia On Oxycodone  Potential benefits of a long term opioids use as well as potential risks (i.e. addiction risk, apnea etc) and complications (i.e. Somnolence, constipation and others) were explained to the patient and were aknowledged.

## 2018-12-27 NOTE — Progress Notes (Signed)
Subjective:  Patient ID: Beth Huffman, female    DOB: 22-May-1961  Age: 58 y.o. MRN: [id number]  CC: No chief complaint on file.   HPI RHEANA CASEBOLT presents for LBP, HAs, HTN f/u Flu exposure  Outpatient Medications Prior to Visit  Medication Sig Dispense Refill  . cholecalciferol (VITAMIN D) 1000 UNITS tablet Take 1,000 Units by mouth daily.      . cyclobenzaprine (FLEXERIL) 5 MG tablet TAKE 1 TABLET THREE TIMES DAILY AS NEEDED FOR MUSCLE SPASMS. (Patient taking differently: Take 5 mg by mouth 3 (three) times daily as needed for muscle spasms. ) 90 tablet 0  . cyclobenzaprine (FLEXERIL) 5 MG tablet TAKE 1 TABLET THREE TIMES DAILY AS NEEDED FOR MUSCLE SPASMS. 90 tablet 0  . cyclobenzaprine (FLEXERIL) 5 MG tablet TAKE 1 TABLET THREE TIMES DAILY AS NEEDED FOR MUSCLE SPASMS. 90 tablet 0  . estradiol (VIVELLE-DOT) 0.05 MG/24HR patch Place 1 patch (0.05 mg total) onto the skin 2 (two) times a week. 24 patch 4  . Galcanezumab-gnlm (EMGALITY) 120 MG/ML SOAJ Inject 120 mg into the skin every 30 (thirty) days. For migraine prophylaxis 1 pen 11  . ibuprofen (ADVIL,MOTRIN) 600 MG tablet TAKE 1 TABLET BY MOUTH 3 TIMES A DAY. 90 tablet 0  . Multiple Vitamin (MULTIVITAMIN PO) Take 1 tablet by mouth daily.      . nortriptyline (PAMELOR) 10 MG capsule Take 2 capsules (20 mg total) by mouth at bedtime. 60 capsule 11  . ondansetron (ZOFRAN) 8 MG tablet TAKE 1 TABLET EVERY 12 HOURS IF NEEDED FOR NAUSEA. (Patient taking differently: Take 8 mg by mouth every 12 (twelve) hours as needed for nausea or vomiting. ) 20 tablet 11  . Oxycodone HCl 10 MG TABS Take 1 tablet (10 mg total) by mouth 4 (four) times daily as needed. Please fill on or after 01/04/19 120 tablet 0  . pantoprazole (PROTONIX) 40 MG tablet TAKE 1 TABLET EACH DAY. (Patient taking differently: Take 40 mg by mouth daily. ) 30 tablet 11  . polyethylene glycol (MIRALAX / GLYCOLAX) packet Take 8.5 g by mouth every other day.    . rizatriptan  (MAXALT) 10 MG tablet Take 1 tablet (10 mg total) by mouth once as needed for up to 1 dose for migraine. May repeat in 2 hours if needed 8 tablet 12  . SUMAtriptan (IMITREX) 100 MG tablet TAKE 1 TABLET AT ONSET OF MIGRAINE- MAY REPEAT ONCE IN 2 HOURS. LIMIT2/24 HOURS. AVOID DAILY USE. 9 tablet 5  . topiramate (TOPAMAX) 200 MG tablet TAKE 1 TABLET BY MOUTH TWICE DAILY. (Patient taking differently: Take 200 mg by mouth at bedtime. ) 60 tablet 11  . verapamil (CALAN-SR) 120 MG CR tablet TAKE ONE TABLET AT BEDTIME. (Patient taking differently: Take 120 mg by mouth at bedtime. ) 30 tablet 10   No facility-administered medications prior to visit.     ROS: Review of Systems  Constitutional: Positive for fatigue. Negative for activity change, appetite change, chills and unexpected weight change.  HENT: Negative for congestion, mouth sores and sinus pressure.   Eyes: Negative for visual disturbance.  Respiratory: Negative for cough and chest tightness.   Gastrointestinal: Negative for abdominal pain and nausea.  Genitourinary: Negative for difficulty urinating, frequency and vaginal pain.  Musculoskeletal: Positive for back pain and neck pain. Negative for gait problem.  Skin: Negative for pallor and rash.  Neurological: Positive for headaches. Negative for dizziness, tremors, weakness and numbness.  Psychiatric/Behavioral: Negative for confusion, sleep disturbance and  suicidal ideas.    Objective:  BP 124/86 (BP Location: Left Arm, Patient Position: Sitting, Cuff Size: Normal)   Pulse 87   Temp 98.7 F (37.1 C) (Oral)   Ht 5' 3.5" (1.613 m)   Wt 165 lb (74.8 kg)   SpO2 98%   BMI 28.77 kg/m   BP Readings from Last 3 Encounters:  12/27/18 124/86  12/07/18 126/86  11/03/18 132/84    Wt Readings from Last 3 Encounters:  12/27/18 165 lb (74.8 kg)  12/07/18 166 lb (75.3 kg)  11/03/18 175 lb (79.4 kg)    Physical Exam Constitutional:      General: She is not in acute distress.     Appearance: She is well-developed.  HENT:     Head: Normocephalic.     Right Ear: External ear normal.     Left Ear: External ear normal.     Nose: Nose normal.  Eyes:     General:        Right eye: No discharge.        Left eye: No discharge.     Conjunctiva/sclera: Conjunctivae normal.     Pupils: Pupils are equal, round, and reactive to light.  Neck:     Musculoskeletal: Normal range of motion. Neck rigidity and muscular tenderness present.     Thyroid: No thyromegaly.     Vascular: No JVD.     Trachea: No tracheal deviation.  Cardiovascular:     Rate and Rhythm: Normal rate and regular rhythm.     Heart sounds: Normal heart sounds.  Pulmonary:     Effort: No respiratory distress.     Breath sounds: No stridor. No wheezing.  Abdominal:     General: Bowel sounds are normal. There is no distension.     Palpations: Abdomen is soft. There is no mass.     Tenderness: There is no abdominal tenderness. There is no guarding or rebound.  Musculoskeletal:        General: Tenderness present.  Lymphadenopathy:     Cervical: No cervical adenopathy.  Skin:    Findings: No erythema or rash.  Neurological:     Cranial Nerves: No cranial nerve deficit.     Motor: No abnormal muscle tone.     Coordination: Coordination normal.     Gait: Gait abnormal.     Deep Tendon Reflexes: Reflexes normal.  Psychiatric:        Behavior: Behavior normal.        Thought Content: Thought content normal.        Judgment: Judgment normal.     Lab Results  Component Value Date   WBC 4.1 08/31/2018   HGB 13.2 08/31/2018   HCT 41.3 08/31/2018   PLT 213 08/31/2018   GLUCOSE 87 08/31/2018   CHOL 189 05/04/2017   TRIG 129.0 05/04/2017   HDL 52.40 05/04/2017   LDLDIRECT 130.2 12/21/2013   LDLCALC 111 (H) 05/04/2017   ALT 35 08/03/2018   AST 29 08/03/2018   NA 142 08/31/2018   K 3.8 08/31/2018   CL 111 08/31/2018   CREATININE 0.98 08/31/2018   BUN 16 08/31/2018   CO2 24 08/31/2018   TSH 1.26  03/31/2018   HGBA1C 5.5 03/31/2018    Mm 3d Screen Breast Bilateral  Result Date: 12/21/2018 CLINICAL DATA:  Screening. EXAM: DIGITAL SCREENING BILATERAL MAMMOGRAM WITH TOMO AND CAD COMPARISON:  Previous exam(s). ACR Breast Density Category b: There are scattered areas of fibroglandular density. FINDINGS: There are no findings suspicious  for malignancy. Images were processed with CAD. IMPRESSION: No mammographic evidence of malignancy. A result letter of this screening mammogram will be mailed directly to the patient. RECOMMENDATION: Screening mammogram in one year. (Code:SM-B-01Y) BI-RADS CATEGORY  1: Negative. Electronically Signed   By: Curlene Dolphin M.D.   On: 12/21/2018 11:41    Assessment & Plan:   There are no diagnoses linked to this encounter.   No orders of the defined types were placed in this encounter.    Follow-up: No follow-ups on file.  Walker Kehr, MD

## 2018-12-27 NOTE — Assessment & Plan Note (Signed)
Chronic Dr Krista Blue [name] wants to switch Dr Jalene Mullet at [facility] Chronic pain

## 2018-12-30 ENCOUNTER — Telehealth: Payer: Self-pay | Admitting: Neurology

## 2018-12-30 NOTE — Telephone Encounter (Signed)
Patient's insurance changed in Feb. She  to [employer] . Patient would like to schedule apt. But patient would like to know if another physician can give her the injection . I relayed to patient I would have Danielle call her on Monday. Patient was fine to wait. Thanks Hinton Dyer.

## 2019-01-03 NOTE — Telephone Encounter (Signed)
That's fine. thanks

## 2019-01-04 ENCOUNTER — Other Ambulatory Visit: Payer: Self-pay | Admitting: Internal Medicine

## 2019-01-07 ENCOUNTER — Encounter: Payer: Self-pay | Admitting: Neurology

## 2019-01-17 ENCOUNTER — Telehealth: Payer: Self-pay | Admitting: Neurology

## 2019-01-17 NOTE — Telephone Encounter (Signed)
Scheduled apt and sent patient a mychart message making her aware.

## 2019-01-21 ENCOUNTER — Encounter: Payer: Self-pay | Admitting: Internal Medicine

## 2019-01-21 ENCOUNTER — Ambulatory Visit (INDEPENDENT_AMBULATORY_CARE_PROVIDER_SITE_OTHER): Payer: PPO | Admitting: Internal Medicine

## 2019-01-21 DIAGNOSIS — J029 Acute pharyngitis, unspecified: Secondary | ICD-10-CM | POA: Insufficient documentation

## 2019-01-21 MED ORDER — AMOXICILLIN-POT CLAVULANATE 875-125 MG PO TABS: 1.0000 | ORAL_TABLET | Freq: Two times a day (BID) | ORAL | 0 refills | Status: DC

## 2019-01-21 MED ORDER — FLUTICASONE PROPIONATE 50 MCG/ACT NA SUSP: 2.0000 | Freq: Every day | NASAL | 0 refills | Status: DC

## 2019-01-21 NOTE — Assessment & Plan Note (Signed)
Is likely viral and discussed this but will finish course of antibiotics since she already started. Rx for flonase as well for congestion symptoms.

## 2019-01-21 NOTE — Progress Notes (Signed)
Virtual Visit via Video Note  I connected with Beth Huffman on 01/21/19 at 10:20 AM EDT by a video enabled telemedicine application and verified that I am speaking with the correct person using two identifiers.   I discussed the limitations of evaluation and management by telemedicine and the availability of in person appointments. The patient expressed understanding and agreed to proceed.  History of Present Illness: The patient is a 58 y.o. female presents with cold symptoms. Started about 4-5 days ago. Main symptoms are: sore throat, running nose, sinus congestion, left ear pain and popping. Denies fevers or chills, denies cough or SOB. Overall it is improving. Has tried amoxicillin BID which she has leftover from dentist that she started taking Tuesday and took 3-4 days of before running out of pills. This has helped her throat feel much better.    Observations/Objective: Appearance: well, breathing appears normal, normal grooming, abdomen does not appear distended, throat red, mental status is A and O times 3  Allergies and history relevant verified and reviewed  Assessment and Plan: See problem oriented charting  Follow Up Instructions: Augmentin 3 days and flonase, call back if no resolution as expected  I discussed the assessment and treatment plan with the patient. The patient was provided an opportunity to ask questions and all were answered. The patient agreed with the plan and demonstrated an understanding of the instructions.   The patient was advised to call back or seek an in-person evaluation if the symptoms worsen or if the condition fails to improve as anticipated.  I provided 15 minutes of non-face-to-face time during this encounter.   Hoyt Koch, MD

## 2019-02-02 ENCOUNTER — Ambulatory Visit: Payer: BLUE CROSS/BLUE SHIELD | Admitting: Internal Medicine

## 2019-02-02 ENCOUNTER — Telehealth: Payer: Self-pay | Admitting: Neurology

## 2019-02-05 ENCOUNTER — Other Ambulatory Visit: Payer: Self-pay | Admitting: Internal Medicine

## 2019-02-09 NOTE — Consult Note (Signed)
Virtual Visit via Video Note The purpose of this virtual visit is to provide medical care while limiting exposure to the novel coronavirus.    Consent was obtained for video visit:  Yes.   Answered questions that patient had about telehealth interaction:  Yes.   I discussed the limitations, risks, security and privacy concerns of performing an evaluation and management service by telemedicine. I also discussed with the patient that there may be a patient responsible charge related to this service. The patient expressed understanding and agreed to proceed.  Pt location: Home Physician Location: office Name of referring provider:  Plotnikov, Evie Lacks, MD I connected with Beth Huffman at patients initiation/request on 02/10/2019 at  2:30 PM EDT by video enabled telemedicine application and verified that I am speaking with the correct person using two identifiers. Pt MRN:  [id number] Pt DOB:  02-Jul-1961 Video Participants:  Beth Huffman   History of Present Illness:  Beth Huffman is a 58 year old right-handed Caucasian woman who presents for migraines.  History supplemented by prior neurologist's notes.  Onset:  Since the early 1990s.  She was evaluated at [facility].  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    MRI of spine from 2005 showed a syrinx from T3 to T12 levels.  She subsequently underwent occipital posterior fossa decompression surgery that year.  A CT cystogram in 2007 showed residual cyst in the posterior fossa with postoperative changes but no hydrocephalus or other intracranial mass.  Headaches subsided following decompression.  They started to return in May 2016.  She was previously followed by Dr. Krista Blue at [facility]. Location:  Bifrontal/posterior neck pain Quality:  Pressure, throbbing Intensity:   10/10.  she denies new headache, thunderclap headache or severe headache that wakes her from sleep. Aura:  no Premonitory Phase:  no Postdrome:  no Associated symptoms:  With or without nausea, photophobia, phonophobia.  She denies associated unilateral numbness or weakness. Duration:  1-2 days.  Often in the morning when she wakes up Frequency:  15 days a month Frequency of abortive medication: sumatriptan or rizatriptan 12-15 days a month Triggers:  Prolonged standing or activity/lifting Relieving factors:  Just medication Activity:  Can't function  Current NSAIDS:  ibuprofen Current analgesics:  oxycodone Current triptans:  Maxalt 10mg , sumatriptan 100mg  Current ergotamine:  none Current anti-emetic:  Zofran 8mg  Current muscle relaxants:  Flexeril 5mg  Current anti-anxiolytic:  none Current sleep aide:  none Current Antihypertensive medications:  Verapamil CR 128mg  Current Antidepressant medications:  none Current Anticonvulsant medications:  topiramate 200mg  twice daily Current anti-CGRP:  none Current Vitamins/Herbal/Supplements:  none Current Antihistamines/Decongestants:  Flonase Other therapy:  none Hormone/birth control:  estradiol  Past NSAIDS:  Cambia, naproxen Past analgesics:  Excedrin, Tylenol Past abortive triptans:  Relpax 40mg , Zomig 5mg  NS, sumatriptan [place] Past abortive ergotamine:  none Past muscle relaxants:  none Past anti-emetic:  none Past antihypertensive medications:  atenolol Past antidepressant medications:  Nortriptyline (did not feel well on it) Past anticonvulsant medications:  Trokendi XR, gabapentin, Lyrica Past anti-CGRP: Emgality (palpitations) Past vitamins/Herbal/Supplements:  none Past antihistamines/decongestants:  none Other past therapies:  Botox (effective)  Caffeine:  1 1/2 cups coffee daily Diet:  Keeps hydrated.  Does not skip meals.  On Optivia diet.  Lost 30 lbs since January Exercise:  Walks dogs Depression:  no; Anxiety:  no  Other pain:  Spinal pain related to perineural cysts.  Recent knee replacement, still with mild soreness Sleep hygiene:  good Family history of headache:  No  08/31/18 LABS: CBC with WBC 4.1, Hgb 13.2, HCT 41.3, PLT 213; BMP with NA 142, K3.8, CL 111, CO2 24, glucose 87, BUN 16, CR 0.98 08/03/18 LABS:  Hepatic panel with t bili 0.5, ALP 94, AST 29, ALT 35.  Past Medical History: Past Medical History:  Diagnosis Date  . Abnormal weight gain   . Acute sinusitis 12/05/2008   1/18 8/18  . Arachnoid cyst 08/19/2011  . Chest tightness or pressure 05/07/2018  . Constipation 09/08/2012   Chronic - opioid related 11/17 [place]  . Contact dermatitis 03/16/2013   5/14 relapsing - poison ivy   . Dyspnea 05/07/2018  . Elevated liver enzymes 03/04/2011   Chronic off and on   . Female stress incontinence 02/06/2009   Qualifier: Diagnosis of  By: Plotnikov MD, Evie Lacks   . FEVER UNSPECIFIED 09/29/2007   Qualifier: Diagnosis of  By: Plotnikov MD, Evie Lacks   . GERD 09/29/2007   Chronic  Protonix prn  Potential benefits of a long term PPI use as well as potential risks  and complications were explained to the patient and were aknowledged.    Marland Kitchen GERD (gastroesophageal reflux disease)   . Headache 12/20/2007   Dr Hassell Done Migraines (1 per 2 wks) Topamax 400 mg/d; Imitrex prn; Ibuprofen prn, Zofran prn   . Hyperglycemia 08/19/2011   Mild    . Hypertension   . Increased blood pressure (not hypertension) 08/23/2014  . Intractable migraine 06/14/2009   Chronic Ibuprofen, Imitrex, Topamax,  Nortriptyline - intolerant  . Knee pain 03/04/2011   R knee 2018 L knee pain - L knee b anserina and L knee is tender w/ROM: L knee pain is worse - patellofemoral syndrome vs other  . LBP (low back pain)   . LOW BACK PAIN 09/29/2007   Chronic, lately (2016) disabling Due to syringomyelia On Oxycodone  Potential benefits of a long term opioids use as well as potential risks (i.e. addiction risk, apnea etc) and complications (i.e.  Somnolence, constipation and others) were explained to the patient and were aknowledged.    . LUQ PAIN 05/23/2010   Qualifier: Diagnosis of  By: Plotnikov MD, Evie Lacks   . Migraine, chronic, without aura, intractable 10/28/2017   Botox approved diagnosis. 7/19 Worse. Start Emagility inj. Relpax  . Migraines   . Nausea & vomiting 08/19/2011  . Nausea without vomiting 06/14/2009   Due to migraines    . NECK PAIN 06/14/2009    Chronic, lately (2016) disabling Due to syringomyelia On Oxycodone  Potential benefits of a long term opioids use as well as potential risks (i.e. addiction risk, apnea etc) and complications (i.e. Somnolence, constipation and others) were explained to the patient and were aknowledged.    Marland Kitchen NONSPECIFIC ABNORMAL RESULTS LIVR FUNCTION STUDY 09/28/2009   Qualifier: Diagnosis of  By: Plotnikov MD, [place] 05/23/2010   Chronic - compulsive shopping   . Onychomycosis 01/30/2016   4/17   . Orthostatic hypotension 06/30/2013   9/14 likely Rapaflo induced   . OTITIS EXTERNA 05/10/2008   Qualifier: Diagnosis of  By: Plotnikov MD, Evie Lacks   . Palpitations 06/21/2014  . PARESTHESIA 07/13/2008   Qualifier: Diagnosis of  By: Plotnikov MD, Evie Lacks   . Partial thickness burn of left wrist 09/11/2015  . Postoperative state 07/24/2016  .  Pyelonephritis 2008  . Rash 07/27/2017  . Stress 2009  . Syncope, near 06/30/2013   9/14 likely Rapaflo induced 12/14 relapsing off Rapaflo x 7 2/15 no relapsing since on 1/2 dose of Topamax   . Syringobulbia ([place])   . SYRINGOMYELIA 08/20/2007   Chronic Dr Krista Blue Dr Jalene Mullet at [facility] Chronic pain   . Syringomyelia ([place])   . Tachycardia   . Tonsillitis, chronic 02/26/2017   2018 worse  . URINARY TRACT INFECTION (UTI) 03/23/2008   Qualifier: Diagnosis of  By: Plotnikov MD, Evie Lacks   . UTI (lower urinary tract infection)   . Well adult exam 12/21/2013   We discussed age appropriate health related issues, including  available/recomended screening tests and vaccinations. We discussed a need for adhering to healthy diet and exercise. Labs/EKG were reviewed/ordered. All questions were answered.       Medications: Outpatient Encounter Medications as of 02/10/2019  Medication Sig  . cholecalciferol (VITAMIN D) 1000 UNITS tablet Take 1,000 Units by mouth daily.    . cyclobenzaprine (FLEXERIL) 5 MG tablet TAKE 1 TABLET THREE TIMES DAILY AS NEEDED FOR MUSCLE SPASMS. (Patient taking differently: Take 5 mg by mouth 3 (three) times daily as needed for muscle spasms. )  . estradiol (VIVELLE-DOT) 0.05 MG/24HR patch Place 1 patch (0.05 mg total) onto the skin 2 (two) times a week.  . fluticasone (FLONASE) 50 MCG/ACT nasal spray Place 2 sprays into both nostrils daily.  Marland Kitchen ibuprofen (ADVIL,MOTRIN) 600 MG tablet TAKE 1 TABLET EVERY 8 HOURS AS NEEDED FOR HEADACHE.  . Multiple Vitamin (MULTIVITAMIN PO) Take 1 tablet by mouth daily.    . ondansetron (ZOFRAN) 8 MG tablet TAKE 1 TABLET EVERY 12 HOURS IF NEEDED FOR NAUSEA. (Patient taking differently: Take 8 mg by mouth every 12 (twelve) hours as needed for nausea or vomiting. )  . Oxycodone HCl 10 MG TABS Take 1 tablet (10 mg total) by mouth 4 (four) times daily as needed. Please fill on or after 02/04/19  . pantoprazole (PROTONIX) 40 MG tablet TAKE 1 TABLET EACH DAY. (Patient taking differently: Take 40 mg by mouth daily. )  . polyethylene glycol (MIRALAX / GLYCOLAX) packet Take 8.5 g by mouth every other day.  . rizatriptan (MAXALT) 10 MG tablet Take 1 tablet (10 mg total) by mouth once as needed for up to 1 dose for migraine. May repeat in 2 hours if needed  . SUMAtriptan (IMITREX) 100 MG tablet TAKE 1 TABLET AT ONSET OF MIGRAINE- MAY REPEAT ONCE IN 2 HOURS. LIMIT2/24 HOURS. AVOID DAILY USE.  . topiramate (TOPAMAX) 200 MG tablet TAKE 1 TABLET BY MOUTH TWICE DAILY. (Patient taking differently: Take 200 mg by mouth at bedtime. )  . verapamil (CALAN-SR) 120 MG CR tablet TAKE ONE  TABLET AT BEDTIME. (Patient taking differently: Take 120 mg by mouth at bedtime. )  . amoxicillin-clavulanate (AUGMENTIN) 875-125 MG tablet Take 1 tablet by mouth 2 (two) times daily. (Patient not taking: Reported on 02/10/2019)  . cyclobenzaprine (FLEXERIL) 5 MG tablet TAKE 1 TABLET THREE TIMES DAILY AS NEEDED FOR MUSCLE SPASMS.  . cyclobenzaprine (FLEXERIL) 5 MG tablet TAKE 1 TABLET THREE TIMES DAILY AS NEEDED FOR MUSCLE SPASMS.  . Galcanezumab-gnlm (EMGALITY) 120 MG/ML SOAJ Inject 120 mg into the skin every 30 (thirty) days. For migraine prophylaxis (Patient not taking: Reported on 02/10/2019)  . naproxen (NAPROSYN) 500 MG tablet Take 1 tablet (500 mg total) by mouth 2 (two) times daily as needed.  . nortriptyline (PAMELOR) 10 MG capsule Take 2  capsules (20 mg total) by mouth at bedtime. (Patient not taking: Reported on 02/10/2019)  . oseltamivir (TAMIFLU) 75 MG capsule Take 1 capsule (75 mg total) by mouth daily. (Patient not taking: Reported on 02/10/2019)  . Oxycodone HCl 10 MG TABS Take 1 tablet (10 mg total) by mouth every 6 (six) hours as needed. (Patient not taking: Reported on 02/10/2019)  . Oxycodone HCl 10 MG TABS Take 1 tablet (10 mg total) by mouth every 6 (six) hours as needed. (Patient not taking: Reported on 02/10/2019)   No facility-administered encounter medications on file as of 02/10/2019.     Allergies: Allergies  Allergen Reactions  . Atenolol Other (See Comments)    Wt gain  . Cefuroxime Axetil Nausea Only and Other (See Comments)    REACTION: nausea - but tolerates PCN  . Codeine Sulfate Nausea And Vomiting and Nausea Only  . Fentanyl Nausea And Vomiting and Other (See Comments)    REACTION: bad reaction  . Nitrofurantoin Other (See Comments)    Unknown  . Pregabalin Other (See Comments)    REACTION: cp  . Rapaflo [Silodosin] Other (See Comments)    Orthostatic BP drop, near syncope   . Tramadol Hcl Nausea And Vomiting    REACTION: sick  . Iodinated Diagnostic  Agents Rash    Family History: Family History  Problem Relation Age of Onset  . COPD Mother   . Peripheral vascular disease Mother   . Hypertension Mother   . Heart disease Mother   . Hypertension Father   . Anxiety disorder Other   . Heart attack Maternal Grandmother   . Stroke Maternal Grandfather   . Stroke Paternal Grandfather   . Colon cancer Maternal Uncle 12  . Breast cancer Maternal Aunt     Social History: Social History   Socioeconomic History  . Marital status: Married    Spouse name: Not on file  . Number of children: 2  . Years of education: College  . Highest education level: Not on file  Occupational History  . Occupation: Office manager: SELF-EMPLOYED  Social Needs  . Financial resource strain: Not on file  . Food insecurity:    Worry: Not on file    Inability: Not on file  . Transportation needs:    Medical: Not on file    Non-medical: Not on file  Tobacco Use  . Smoking status: Never Smoker  . Smokeless tobacco: Never Used  Substance and Sexual Activity  . Alcohol use: No  . Drug use: No  . Sexual activity: Yes  Lifestyle  . Physical activity:    Days per week: Not on file    Minutes per session: Not on file  . Stress: Not on file  Relationships  . Social connections:    Talks on phone: Not on file    Gets together: Not on file    Attends religious service: Not on file    Active member of club or organization: Not on file    Attends meetings of clubs or organizations: Not on file    Relationship status: Not on file  . Intimate partner violence:    Fear of current or ex partner: Not on file    Emotionally abused: Not on file    Physically abused: Not on file    Forced sexual activity: Not on file  Other Topics Concern  . Not on file  Social History Narrative   Lives at home with her husband.  Right-handed.   1 cup caffeine per day.   Observations/Objective:   There were no vitals filed for this visit. No  acute distress.  Alert and oriented.  Speech fluent and not dysarthric.  Language intact.  Eyes orthophoric and move in all directions.  Face symmetric.   Assessment and Plan:   Chronic migraine without aura, without status migrainosus, not intractable.   Syringomyelia  Ideally, I would like to continue Botox since she has 15 headache days a month for well-over 3 consecutive months, failed multiple preventatives and Botox previously effective.  However, due to the pandemic, in-office procedures are on-hold. 1.  Instead, we will try Aimovig 70mg  monthly 2.  For abortive therapy, she will try taking naproxen 500mg  along with sumatriptan or rizatripan.  Prescription sent to her pharmacy.  This may be more effective when she wakes up with a migraine.  If ineffective, sumatriptan [place] (previously effective) 3.  Limit use of pain relievers to no more than 2 days out of week to prevent risk of rebound or medication-overuse headache. 4.  Keep headache diary 5.  Follow up in 4 months.  Follow Up Instructions:    -I discussed the assessment and treatment plan with the patient. The patient was provided an opportunity to ask questions and all were answered. The patient agreed with the plan and demonstrated an understanding of the instructions.   The patient was advised to call back or seek an in-person evaluation if the symptoms worsen or if the condition fails to improve as anticipated.   Dudley Major, DO

## 2019-02-09 NOTE — Telephone Encounter (Signed)
I called the patient back to follow up on this. She stated that she left a message while I was on vacation for someone and never heard back. I notified her that per my phone note history she and I had spoken and discussed her changing treatment to Dr. Jaynee Eagles. I also told her I sent her a mychart message that was showing as "read" she did not recall reading that. I told her to let us know if she changes her mind. DW

## 2019-02-09 NOTE — Telephone Encounter (Signed)
FYI Pt has called back stating she was responding to a message she states she received a few days ago from [place].  The mychart message from 04-08 was read to pt.  Pt stated that she had not heard back from anyone from a message she left in March so she has gone to another practice for treatment. An apology was extended to pt for her not hearing back from anyone for whatever the reason was. No call back requested.

## 2019-02-10 ENCOUNTER — Encounter: Payer: Self-pay | Admitting: Neurology

## 2019-02-10 ENCOUNTER — Telehealth: Payer: Self-pay | Admitting: Neurology

## 2019-02-10 ENCOUNTER — Telehealth (INDEPENDENT_AMBULATORY_CARE_PROVIDER_SITE_OTHER): Payer: PPO | Admitting: Neurology

## 2019-02-10 ENCOUNTER — Other Ambulatory Visit: Payer: Self-pay

## 2019-02-10 VITALS — BP 128/70 | Ht 63.0 in | Wt 154.0 lb

## 2019-02-10 DIAGNOSIS — G43709 Chronic migraine without aura, not intractable, without status migrainosus: Secondary | ICD-10-CM

## 2019-02-10 DIAGNOSIS — G950 Syringomyelia and syringobulbia: Secondary | ICD-10-CM

## 2019-02-10 MED ORDER — NAPROXEN 500 MG PO TABS: 500.0000 mg | ORAL_TABLET | Freq: Two times a day (BID) | ORAL | 3 refills | Status: DC | PRN

## 2019-02-10 NOTE — Telephone Encounter (Signed)
Patient calling in about emgality shot and she is unable to get that. She said that she was supposed to let the doctor know this. Please call her back at [phone]. Thanks!

## 2019-02-11 ENCOUNTER — Ambulatory Visit: Payer: PPO | Admitting: Neurology

## 2019-02-11 NOTE — Telephone Encounter (Signed)
I will inform patient and have her pick up sample.

## 2019-02-11 NOTE — Telephone Encounter (Signed)
Your note from yesterday states she was going to try [place].  Please advise.

## 2019-02-11 NOTE — Telephone Encounter (Signed)
Yes.  I would like to start Aimovig 70mg  monthly for her.

## 2019-02-13 ENCOUNTER — Other Ambulatory Visit: Payer: Self-pay | Admitting: Neurology

## 2019-02-13 ENCOUNTER — Other Ambulatory Visit: Payer: Self-pay | Admitting: Internal Medicine

## 2019-02-16 ENCOUNTER — Other Ambulatory Visit: Payer: Self-pay | Admitting: Neurology

## 2019-03-08 NOTE — Telephone Encounter (Signed)
I called and spoke with the patient to make her aware that there is Botox in our office if she would like to pick it up. She is going to call us back and come by soon to pick it up.

## 2019-03-23 ENCOUNTER — Ambulatory Visit: Payer: PPO | Admitting: Neurology

## 2019-03-28 ENCOUNTER — Encounter: Payer: Self-pay | Admitting: Internal Medicine

## 2019-03-28 ENCOUNTER — Ambulatory Visit (INDEPENDENT_AMBULATORY_CARE_PROVIDER_SITE_OTHER): Payer: PPO | Admitting: Internal Medicine

## 2019-03-28 DIAGNOSIS — N3000 Acute cystitis without hematuria: Secondary | ICD-10-CM

## 2019-03-28 MED ORDER — FLUCONAZOLE 150 MG PO TABS: 150.0000 mg | ORAL_TABLET | Freq: Once | ORAL | 1 refills | Status: AC

## 2019-03-28 MED ORDER — CIPROFLOXACIN HCL 250 MG PO TABS: 250.0000 mg | ORAL_TABLET | Freq: Two times a day (BID) | ORAL | 0 refills | Status: DC

## 2019-03-28 NOTE — Assessment & Plan Note (Signed)
We will treat with Cipro for 5 days.  Diflucan if needed Follow-up with me on Wednesday-she has a regular appointment

## 2019-03-28 NOTE — Progress Notes (Signed)
Virtual Visit via Video Note  I connected with Beth Huffman on 03/28/19 at  4:00 PM EDT by a video enabled telemedicine application and verified that I am speaking with the correct person using two identifiers.   I discussed the limitations of evaluation and management by telemedicine and the availability of in person appointments. The patient expressed understanding and agreed to proceed.  History of Present Illness: Beth Huffman is complaining of urinary frequency, urgency, pressure with low-grade fever present since last Friday.  She tried cranberry juice and Monistat with no relief  There has been no runny nose, cough, chest pain, shortness of breath, abdominal pain, diarrhea, constipation, arthralgias, skin rashes.   Observations/Objective: The patient appears to be in no acute distress  Assessment and Plan:  See my Assessment and Plan. Follow Up Instructions:    I discussed the assessment and treatment plan with the patient. The patient was provided an opportunity to ask questions and all were answered. The patient agreed with the plan and demonstrated an understanding of the instructions.   The patient was advised to call back or seek an in-person evaluation if the symptoms worsen or if the condition fails to improve as anticipated.  I provided face-to-face time during this encounter. We were at different locations.   Walker Kehr, MD

## 2019-03-30 ENCOUNTER — Encounter: Payer: Self-pay | Admitting: Internal Medicine

## 2019-03-30 ENCOUNTER — Ambulatory Visit (INDEPENDENT_AMBULATORY_CARE_PROVIDER_SITE_OTHER): Payer: PPO | Admitting: Internal Medicine

## 2019-03-30 DIAGNOSIS — G950 Syringomyelia and syringobulbia: Secondary | ICD-10-CM

## 2019-03-30 DIAGNOSIS — G43719 Chronic migraine without aura, intractable, without status migrainosus: Secondary | ICD-10-CM

## 2019-03-30 DIAGNOSIS — N3000 Acute cystitis without hematuria: Secondary | ICD-10-CM | POA: Diagnosis not present

## 2019-03-30 DIAGNOSIS — M5441 Lumbago with sciatica, right side: Secondary | ICD-10-CM

## 2019-03-30 DIAGNOSIS — M5442 Lumbago with sciatica, left side: Secondary | ICD-10-CM | POA: Diagnosis not present

## 2019-03-30 DIAGNOSIS — G8929 Other chronic pain: Secondary | ICD-10-CM

## 2019-03-30 MED ORDER — OXYCODONE HCL 10 MG PO TABS: 10.0000 mg | ORAL_TABLET | Freq: Four times a day (QID) | ORAL | 0 refills | Status: DC | PRN

## 2019-03-30 MED ORDER — CYCLOBENZAPRINE HCL 5 MG PO TABS: ORAL_TABLET | ORAL | 1 refills | Status: DC

## 2019-03-30 NOTE — Assessment & Plan Note (Signed)
Oxycodone prescriptions were issued for July, August, September Flexeril as needed

## 2019-03-30 NOTE — Assessment & Plan Note (Signed)
Better on Cipro

## 2019-03-30 NOTE — Assessment & Plan Note (Signed)
The patient is weaning herself off Topamax.  Should thinks it was affecting her concentration

## 2019-03-30 NOTE — Progress Notes (Signed)
Virtual Visit via Video Note  I connected with Beth Huffman on 03/30/19 at  8:50 AM EDT by a video enabled telemedicine application and verified that I am speaking with the correct person using two identifiers.   I discussed the limitations of evaluation and management by telemedicine and the availability of in person appointments. The patient expressed understanding and agreed to proceed.  History of Present Illness: We need to follow-up on chronic pain, UTI, migraine headaches  Beth Huffman lost 30 pounds in 3 months on diet  Low back pain is better after treating UTI  The patient is weaning herself off Topamax.  Should thinks it was affecting her concentration  There has been no runny nose, cough, chest pain, shortness of breath, abdominal pain, diarrhea, constipation, arthralgias, skin rashes.   Observations/Objective: The patient appears to be in no acute distress, looks well.  Assessment and Plan:  See my Assessment and Plan. Follow Up Instructions:    I discussed the assessment and treatment plan with the patient. The patient was provided an opportunity to ask questions and all were answered. The patient agreed with the plan and demonstrated an understanding of the instructions.   The patient was advised to call back or seek an in-person evaluation if the symptoms worsen or if the condition fails to improve as anticipated.  I provided face-to-face time during this encounter. We were at different locations.   Walker Kehr, MD

## 2019-03-30 NOTE — Assessment & Plan Note (Signed)
Oxycodone prescriptions were issued for July, August, September

## 2019-04-21 ENCOUNTER — Other Ambulatory Visit: Payer: Self-pay | Admitting: Internal Medicine

## 2019-05-10 ENCOUNTER — Telehealth: Payer: Self-pay | Admitting: Internal Medicine

## 2019-05-10 NOTE — Telephone Encounter (Signed)
°*  STAT* If patient is at the pharmacy, call can be transferred to refill team.   1. Which medications need to be refilled? (please list name of each medication and dose if known) Verapamil 120mg  1 qd  2. Which pharmacy/location (including street and city if local pharmacy) is medication to be sent to? [place]  3. Do they need a 30 day or 90 day supply? 90 day appt 07-20-19 Caryl Comes

## 2019-06-07 ENCOUNTER — Other Ambulatory Visit: Payer: Self-pay

## 2019-06-07 DIAGNOSIS — H04123 Dry eye syndrome of bilateral lacrimal glands: Secondary | ICD-10-CM | POA: Diagnosis not present

## 2019-06-07 DIAGNOSIS — H2513 Age-related nuclear cataract, bilateral: Secondary | ICD-10-CM | POA: Diagnosis not present

## 2019-06-07 DIAGNOSIS — H43811 Vitreous degeneration, right eye: Secondary | ICD-10-CM | POA: Diagnosis not present

## 2019-06-08 ENCOUNTER — Telehealth: Payer: PPO | Admitting: Family

## 2019-06-08 DIAGNOSIS — R10A0 Flank pain, unspecified side: Secondary | ICD-10-CM

## 2019-06-08 DIAGNOSIS — R399 Unspecified symptoms and signs involving the genitourinary system: Secondary | ICD-10-CM

## 2019-06-08 DIAGNOSIS — R109 Unspecified abdominal pain: Secondary | ICD-10-CM

## 2019-06-08 DIAGNOSIS — N898 Other specified noninflammatory disorders of vagina: Secondary | ICD-10-CM

## 2019-06-08 NOTE — Progress Notes (Signed)
Based on what you shared with me, I feel your condition warrants further evaluation and I recommend that you be seen for a face to face office visit.  Given your symptoms of recurrent UTI symptoms with back pain, you need to be seen face to face for a urine culture.    NOTE: If you entered your credit card information for this eVisit, you will not be charged. You may see a "hold" on your card for the $35 but that hold will drop off and you will not have a charge processed.  If you are having a true medical emergency please call 911.     For an urgent face to face visit, [facility] has five urgent care centers for your convenience:    [email or web address] to reserve your spot online an avoid wait times  [facility] (New Address!) [address], [place], [place] [address] *Just off [address], across the road from [place] hours of operation: Monday-Friday, 12 PM to 6 PM  Closed Saturday & Sunday   The following sites will take your insurance:  . [facility]    [phone]                  Get Driving Directions  [address], [place] [address] . 10 am to 8 pm Monday-Friday . 12 pm to 8 pm Saturday-Sunday   . [facility] at [place]                  Get Driving Directions  [address], [place], [place] [address] . 8 am to 8 pm Monday-Friday . 9 am to 6 pm Saturday . 11 am to 6 pm Sunday   . [facility] at [place]                  Get Driving Directions   [address] [place], [place] [address] . 8 am to 8 pm Monday-Friday . 8 am to 4 pm Saturday-Sunday    . [facility] at [place]                    Get Driving Directions  [phone]  [address], [place], [place] [address]  . Monday-Friday, 12 PM to 6 PM    Your e-visit answers were reviewed  by a board certified advanced clinical practitioner to complete your personal care plan.  Thank you for using e-Visits.

## 2019-06-08 NOTE — Telephone Encounter (Signed)
Patient declined appointment. She inquired why this medication cannot be filled, as she has upcoming appointment on 9/1. Patient requesting call back from [place].

## 2019-06-09 ENCOUNTER — Other Ambulatory Visit (INDEPENDENT_AMBULATORY_CARE_PROVIDER_SITE_OTHER): Payer: PPO

## 2019-06-09 ENCOUNTER — Other Ambulatory Visit: Payer: Self-pay | Admitting: *Deleted

## 2019-06-09 DIAGNOSIS — R300 Dysuria: Secondary | ICD-10-CM

## 2019-06-09 LAB — URINALYSIS, ROUTINE W REFLEX MICROSCOPIC
Bilirubin Urine: NEGATIVE
Hgb urine dipstick: NEGATIVE
Ketones, ur: NEGATIVE
Leukocytes,Ua: NEGATIVE
Nitrite: NEGATIVE
RBC / HPF: NONE SEEN (ref 0–?)
Specific Gravity, Urine: 1.01 (ref 1.000–1.030)
Total Protein, Urine: NEGATIVE
Urine Glucose: NEGATIVE
Urobilinogen, UA: 0.2 (ref 0.0–1.0)
pH: 7 (ref 5.0–8.0)

## 2019-06-11 LAB — URINE CULTURE
MICRO NUMBER:: 768367
Result:: NO GROWTH
SPECIMEN QUALITY:: ADEQUATE

## 2019-06-13 NOTE — Progress Notes (Signed)
Virtual Visit via Video Note The purpose of this virtual visit is to provide medical care while limiting exposure to the novel coronavirus.    Consent was obtained for video visit:  Yes.   Answered questions that patient had about telehealth interaction:  Yes.   I discussed the limitations, risks, security and privacy concerns of performing an evaluation and management service by telemedicine. I also discussed with the patient that there may be a patient responsible charge related to this service. The patient expressed understanding and agreed to proceed.  Pt location: Home Physician Location: office Name of referring provider:  Plotnikov, Evie Lacks, MD I connected with Beth Huffman at patients initiation/request on 06/14/2019 at  8:50 AM EDT by video enabled telemedicine application and verified that I am speaking with the correct person using two identifiers. Pt MRN:  [id number] Pt DOB:  1961-08-15 Video Participants:  Beth Huffman   History of Present Illness:   Beth Huffman is a 58 year old right-handed Caucasian woman who follows up for migraines.  UPDATE: Never started Aimovig.  Tapered off of topiramate.  Taking naproxen with a triptan. Intensity:  severe Duration:  1 hour Frequency:  15 days a month Current NSAIDS:  ibuprofen, naproxen (take with triptan) Current analgesics:  oxycodone Current triptans:  Maxalt 10mg , sumatriptan 100mg  Current ergotamine:  none Current anti-emetic:  Zofran 8mg  Current muscle relaxants:  Flexeril 5mg  Current anti-anxiolytic:  none Current sleep aide:  none Current Antihypertensive medications:  Verapamil CR 128mg  Current Antidepressant medications:  none Current Anticonvulsant medications:  topiramate 200mg  twice daily Current anti-CGRP:  none Current Vitamins/Herbal/Supplements:  none Current Antihistamines/Decongestants:  Flonase Other therapy:  none Hormone/birth control:  estradiol  Caffeine:  1 1/2 cups coffee daily  Diet:  Keeps hydrated.  Does not skip meals.  On Optivia diet.  Lost 30 lbs since January Exercise:  Walks dogs Depression:  no; Anxiety:  no Other pain:  Spinal pain related to perineural cysts.  Recent knee replacement, still with mild soreness Sleep hygiene:  good  HISTORY: Onset:  Since the early 1990s.  She was evaluated at [facility].  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    MRI of spine from 2005 showed a syrinx from T3 to T12 levels.  She subsequently underwent occipital posterior fossa decompression surgery that year.  A CT cystogram in 2007 showed residual cyst in the posterior fossa with postoperative changes but no hydrocephalus or other intracranial mass.  Headaches subsided following decompression.  They started to return in May 2016.  She was previously followed by Dr. Krista Blue at [facility]. Location:  Bifrontal/posterior neck pain Quality:  Pressure, throbbing Initial intensity:  10/10.  she denies new headache, thunderclap headache or severe headache that wakes her from sleep. Aura:  no Premonitory Phase:  no Postdrome:  no Associated symptoms:  With or without nausea, photophobia, phonophobia.  She denies associated unilateral numbness or weakness. Initial Duration:  1-2 days.  Often in the morning when she wakes up Initial Frequency:  15 days a month Initial Frequency of abortive medication: sumatriptan or rizatriptan 12-15 days a month Triggers:  Prolonged standing or activity/lifting Relieving factors:  Just medications. Activity:  Can't function  Past NSAIDS:  Cambia, naproxen Past analgesics:  Excedrin, Tylenol Past abortive triptans:  Relpax 40mg , Zomig 5mg  NS, sumatriptan [place] Past abortive ergotamine:  none Past muscle relaxants:  none Past anti-emetic:  none Past antihypertensive medications:   atenolol Past antidepressant medications:  Nortriptyline (did not feel well on it) Past anticonvulsant medications:  Trokendi XR, gabapentin, Lyrica Past anti-CGRP: Emgality (palpitations) Past vitamins/Herbal/Supplements:  none Past antihistamines/decongestants:  none Other past therapies:  Botox (effective)  Family history of headache:  No  PAST MEDICAL HISTORY: Past Medical History:  Diagnosis Date  . Abnormal weight gain   . Acute sinusitis 12/05/2008   1/18 8/18  . Arachnoid cyst 08/19/2011  . Chest tightness or pressure 05/07/2018  . Constipation 09/08/2012   Chronic - opioid related 11/17 [place]  . Contact dermatitis 03/16/2013   5/14 relapsing - poison ivy   . Dyspnea 05/07/2018  . Elevated liver enzymes 03/04/2011   Chronic off and on   . Female stress incontinence 02/06/2009   Qualifier: Diagnosis of  By: Plotnikov MD, Evie Lacks   . FEVER UNSPECIFIED 09/29/2007   Qualifier: Diagnosis of  By: Plotnikov MD, Evie Lacks   . GERD 09/29/2007   Chronic  Protonix prn  Potential benefits of a long term PPI use as well as potential risks  and complications were explained to the patient and were aknowledged.    Marland Kitchen GERD (gastroesophageal reflux disease)   . Headache 12/20/2007   Dr Hassell Done Migraines (1 per 2 wks) Topamax 400 mg/d; Imitrex prn; Ibuprofen prn, Zofran prn   . Hyperglycemia 08/19/2011   Mild    . Hypertension   . Increased blood pressure (not hypertension) 08/23/2014  . Intractable migraine 06/14/2009   Chronic Ibuprofen, Imitrex, Topamax,  Nortriptyline - intolerant  . Knee pain 03/04/2011   R knee 2018 L knee pain - L knee b anserina and L knee is tender w/ROM: L knee pain is worse - patellofemoral syndrome vs other  . LBP (low back pain)   . LOW BACK PAIN 09/29/2007   Chronic, lately (2016) disabling Due to syringomyelia On Oxycodone  Potential benefits of a long term opioids use as well as potential risks (i.e. addiction risk, apnea etc) and complications (i.e. Somnolence,  constipation and others) were explained to the patient and were aknowledged.    . LUQ PAIN 05/23/2010   Qualifier: Diagnosis of  By: Plotnikov MD, Evie Lacks   . Migraine, chronic, without aura, intractable 10/28/2017   Botox approved diagnosis. 7/19 Worse. Start Emagility inj. Relpax  . Migraines   . Nausea & vomiting 08/19/2011  . Nausea without vomiting 06/14/2009   Due to migraines    . NECK PAIN 06/14/2009    Chronic, lately (2016) disabling Due to syringomyelia On Oxycodone  Potential benefits of a long term opioids use as well as potential risks (i.e. addiction risk, apnea etc) and complications (i.e. Somnolence, constipation and others) were explained to the patient and were aknowledged.    Marland Kitchen NONSPECIFIC ABNORMAL RESULTS LIVR FUNCTION STUDY 09/28/2009   Qualifier: Diagnosis of  By: Plotnikov MD, [place] 05/23/2010   Chronic - compulsive shopping   . Onychomycosis 01/30/2016   4/17   . Orthostatic hypotension 06/30/2013   9/14 likely Rapaflo induced   . OTITIS EXTERNA 05/10/2008   Qualifier: Diagnosis of  By: Plotnikov MD, Evie Lacks   . Palpitations 06/21/2014  . PARESTHESIA 07/13/2008   Qualifier: Diagnosis of  By: Plotnikov MD, Evie Lacks   . Partial thickness burn of left wrist 09/11/2015  . Postoperative state 07/24/2016  . Pyelonephritis 2008  . Rash 07/27/2017  . Stress 2009  .  Syncope, near 06/30/2013   9/14 likely Rapaflo induced 12/14 relapsing off Rapaflo x 7 2/15 no relapsing since on 1/2 dose of Topamax   . Syringobulbia ([place])   . SYRINGOMYELIA 08/20/2007   Chronic Dr Krista Blue Dr Jalene Mullet at [facility] Chronic pain   . Syringomyelia ([place])   . Tachycardia   . Tonsillitis, chronic 02/26/2017   2018 worse  . URINARY TRACT INFECTION (UTI) 03/23/2008   Qualifier: Diagnosis of  By: Plotnikov MD, Evie Lacks   . UTI (lower urinary tract infection)   . Well adult exam 12/21/2013   We discussed age appropriate health related issues, including available/recomended  screening tests and vaccinations. We discussed a need for adhering to healthy diet and exercise. Labs/EKG were reviewed/ordered. All questions were answered.       MEDICATIONS: Current Outpatient Medications on File Prior to Visit  Medication Sig Dispense Refill  . amoxicillin-clavulanate (AUGMENTIN) 875-125 MG tablet Take 1 tablet by mouth 2 (two) times daily. (Patient not taking: Reported on 02/10/2019) 6 tablet 0  . cholecalciferol (VITAMIN D) 1000 UNITS tablet Take 1,000 Units by mouth daily.      . ciprofloxacin (CIPRO) 250 MG tablet Take 1 tablet (250 mg total) by mouth 2 (two) times daily. 10 tablet 0  . cyclobenzaprine (FLEXERIL) 5 MG tablet TAKE 1 TABLET THREE TIMES DAILY AS NEEDED FOR MUSCLE SPASMS. 90 tablet 1  . estradiol (VIVELLE-DOT) 0.05 MG/24HR patch Place 1 patch (0.05 mg total) onto the skin 2 (two) times a week. 24 patch 4  . fluticasone (FLONASE) 50 MCG/ACT nasal spray Place 2 sprays into both nostrils daily. 16 g 0  . Galcanezumab-gnlm (EMGALITY) 120 MG/ML SOAJ Inject 120 mg into the skin every 30 (thirty) days. For migraine prophylaxis (Patient not taking: Reported on 02/10/2019) 1 pen 11  . ibuprofen (ADVIL) 600 MG tablet TAKE 1 TABLET EVERY 8 HOURS AS NEEDED FOR HEADACHE. 90 tablet 0  . Multiple Vitamin (MULTIVITAMIN PO) Take 1 tablet by mouth daily.      . naproxen (NAPROSYN) 500 MG tablet Take 1 tablet (500 mg total) by mouth 2 (two) times daily as needed. 20 tablet 3  . ondansetron (ZOFRAN) 8 MG tablet TAKE 1 TABLET EVERY 12 HOURS IF NEEDED FOR NAUSEA. 20 tablet 2  . oseltamivir (TAMIFLU) 75 MG capsule Take 1 capsule (75 mg total) by mouth daily. (Patient not taking: Reported on 02/10/2019) 10 capsule 0  . Oxycodone HCl 10 MG TABS Take 1 tablet (10 mg total) by mouth 4 (four) times daily as needed. 120 tablet 0  . Oxycodone HCl 10 MG TABS Take 1 tablet (10 mg total) by mouth every 6 (six) hours as needed. 120 tablet 0  . Oxycodone HCl 10 MG TABS Take 1 tablet (10 mg total)  by mouth every 6 (six) hours as needed. 120 tablet 0  . pantoprazole (PROTONIX) 40 MG tablet TAKE 1 TABLET EACH DAY. (Patient taking differently: Take 40 mg by mouth daily. ) 30 tablet 11  . polyethylene glycol (MIRALAX / GLYCOLAX) packet Take 8.5 g by mouth every other day.    . rizatriptan (MAXALT) 10 MG tablet Take 1 tablet (10 mg total) by mouth once as needed for up to 1 dose for migraine. May repeat in 2 hours if needed 8 tablet 12  . SUMAtriptan (IMITREX) 100 MG tablet TAKE 1 TABLET AT ONSET OF MIGRAINE- MAY REPEAT ONCE IN 2 HOURS. LIMIT2/24 HOURS. AVOID DAILY USE. 9 tablet 5  . topiramate (TOPAMAX) 200 MG tablet TAKE  1 TABLET BY MOUTH TWICE DAILY. (Patient taking differently: Take 200 mg by mouth at bedtime. ) 60 tablet 11  . verapamil (CALAN-SR) 120 MG CR tablet TAKE ONE TABLET AT BEDTIME. (Patient taking differently: Take 120 mg by mouth at bedtime. ) 30 tablet 10   No current facility-administered medications on file prior to visit.     ALLERGIES: Allergies  Allergen Reactions  . Atenolol Other (See Comments)    Wt gain  . Cefuroxime Axetil Nausea Only and Other (See Comments)    REACTION: nausea - but tolerates PCN  . Codeine Sulfate Nausea And Vomiting and Nausea Only  . Fentanyl Nausea And Vomiting and Other (See Comments)    REACTION: bad reaction  . Nitrofurantoin Other (See Comments)    Unknown  . Pregabalin Other (See Comments)    REACTION: cp  . Rapaflo [Silodosin] Other (See Comments)    Orthostatic BP drop, near syncope   . Tramadol Hcl Nausea And Vomiting    REACTION: sick  . Iodinated Diagnostic Agents Rash    FAMILY HISTORY: Family History  Problem Relation Age of Onset  . COPD Mother   . Peripheral vascular disease Mother   . Hypertension Mother   . Heart disease Mother   . Hypertension Father   . Anxiety disorder Other   . Heart attack Maternal Grandmother   . Stroke Maternal Grandfather   . Stroke Paternal Grandfather   . Colon cancer Maternal  Uncle 42  . Breast cancer Maternal Aunt     SOCIAL HISTORY: Social History   Socioeconomic History  . Marital status: Married    Spouse name: Not on file  . Number of children: 2  . Years of education: College  . Highest education level: Not on file  Occupational History  . Occupation: Office manager: SELF-EMPLOYED  Social Needs  . Financial resource strain: Not on file  . Food insecurity    Worry: Not on file    Inability: Not on file  . Transportation needs    Medical: Not on file    Non-medical: Not on file  Tobacco Use  . Smoking status: Never Smoker  . Smokeless tobacco: Never Used  Substance and Sexual Activity  . Alcohol use: No  . Drug use: No  . Sexual activity: Yes  Lifestyle  . Physical activity    Days per week: Not on file    Minutes per session: Not on file  . Stress: Not on file  Relationships  . Social Herbalist on phone: Not on file    Gets together: Not on file    Attends religious service: Not on file    Active member of club or organization: Not on file    Attends meetings of clubs or organizations: Not on file    Relationship status: Not on file  . Intimate partner violence    Fear of current or ex partner: Not on file    Emotionally abused: Not on file    Physically abused: Not on file    Forced sexual activity: Not on file  Other Topics Concern  . Not on file  Social History Narrative   Lives at home with her husband.   Right-handed.   1 cup caffeine per day.    REVIEW OF SYSTEMS: Constitutional: No fevers, chills, or sweats, no generalized fatigue, change in appetite Eyes: No visual changes, double vision, eye pain Ear, nose and throat: No hearing loss, ear  pain, nasal congestion, sore throat Cardiovascular: No chest pain, palpitations Respiratory:  No shortness of breath at rest or with exertion, wheezes GastrointestinaI: No nausea, vomiting, diarrhea, abdominal pain, fecal incontinence  Genitourinary:  No dysuria, urinary retention or frequency Musculoskeletal:  No neck pain, back pain Integumentary: No rash, pruritus, skin lesions Neurological: as above Psychiatric: No depression, insomnia, anxiety Endocrine: No palpitations, fatigue, diaphoresis, mood swings, change in appetite, change in weight, increased thirst Hematologic/Lymphatic:  No purpura, petechiae. Allergic/Immunologic: no itchy/runny eyes, nasal congestion, recent allergic reactions, rashes  PHYSICAL EXAM: Blood pressure 129/78, height 5\' 6"  (1.676 m), weight 155 lb (70.3 kg).  General: No acute distress.  Patient appears No acute distress.  Alert and oriented.  Speech fluent and not dysarthric.  Language intact.  Face symmetric.  IMPRESSION: 1.  Chronic migraine without aura, without status migrainosus, not intractable 2.  Syringomyelia  PLAN: 1.  For preventative management, we will start Botox as we are now performing it in office.  She has failed multiple preventatives, including anti-CGRP and previously has done well with Botox. 2.  For abortive therapy, sumatriptan or rizatriptan with naproxen 500mg  3.  Limit use of pain relievers to no more than 2 days out of week to prevent risk of rebound or medication-overuse headache. 4.  Keep headache diary 5.  Exercise, hydration, caffeine cessation, sleep hygiene, monitor for and avoid triggers 6.  Consider:  magnesium citrate 400mg  daily, riboflavin 400mg  daily, and coenzyme Q10 100mg  three times daily 7. Always keep in mind that currently taking a hormone or birth control may be a possible trigger or aggravating factor for migraine. 8. Follow up for Botox   Follow Up Instructions:    -I discussed the assessment and treatment plan with the patient. The patient was provided an opportunity to ask questions and all were answered. The patient agreed with the plan and demonstrated an understanding of the instructions.   The patient was advised to call back  or seek an in-person evaluation if the symptoms worsen or if the condition fails to improve as anticipated.   Dudley Major, DO

## 2019-06-14 ENCOUNTER — Encounter: Payer: Self-pay | Admitting: Internal Medicine

## 2019-06-14 ENCOUNTER — Ambulatory Visit (INDEPENDENT_AMBULATORY_CARE_PROVIDER_SITE_OTHER): Payer: PPO | Admitting: Internal Medicine

## 2019-06-14 ENCOUNTER — Encounter: Payer: Self-pay | Admitting: Neurology

## 2019-06-14 ENCOUNTER — Other Ambulatory Visit: Payer: Self-pay

## 2019-06-14 ENCOUNTER — Telehealth (INDEPENDENT_AMBULATORY_CARE_PROVIDER_SITE_OTHER): Payer: PPO | Admitting: Neurology

## 2019-06-14 VITALS — BP 129/78 | Ht 66.0 in | Wt 155.0 lb

## 2019-06-14 DIAGNOSIS — G950 Syringomyelia and syringobulbia: Secondary | ICD-10-CM | POA: Diagnosis not present

## 2019-06-14 DIAGNOSIS — J3501 Chronic tonsillitis: Secondary | ICD-10-CM

## 2019-06-14 DIAGNOSIS — G8929 Other chronic pain: Secondary | ICD-10-CM | POA: Diagnosis not present

## 2019-06-14 DIAGNOSIS — M5441 Lumbago with sciatica, right side: Secondary | ICD-10-CM | POA: Diagnosis not present

## 2019-06-14 DIAGNOSIS — M5442 Lumbago with sciatica, left side: Secondary | ICD-10-CM

## 2019-06-14 DIAGNOSIS — G43709 Chronic migraine without aura, not intractable, without status migrainosus: Secondary | ICD-10-CM

## 2019-06-14 MED ORDER — OXYCODONE HCL 10 MG PO TABS: 10.0000 mg | ORAL_TABLET | Freq: Four times a day (QID) | ORAL | 0 refills | Status: DC | PRN

## 2019-06-14 MED ORDER — AZITHROMYCIN 250 MG PO TABS: ORAL_TABLET | ORAL | 0 refills | Status: DC

## 2019-06-14 NOTE — Assessment & Plan Note (Signed)
Pain Rx ref for Sept 2020

## 2019-06-14 NOTE — Progress Notes (Signed)
Virtual Visit via Video Note  I connected with Beth Huffman on 06/14/19 at  9:50 AM EDT by a video enabled telemedicine application and verified that I am speaking with the correct person using two identifiers.   I discussed the limitations of evaluation and management by telemedicine and the availability of in person appointments. The patient expressed understanding and agreed to proceed.  History of Present Illness:  Beth Huffman presents for ST x 2 d and R earache, white on the R tonsil; chills. Feeling worse F/u LBP There has been no runny nose, cough, chest pain, shortness of breath, abdominal pain, diarrhea, constipation, skin rashes.   Observations/Objective: The patient appears to be in no acute distress, looks tired  Assessment and Plan:  See my Assessment and Plan. Follow Up Instructions:    I discussed the assessment and treatment plan with the patient. The patient was provided an opportunity to ask questions and all were answered. The patient agreed with the plan and demonstrated an understanding of the instructions.   The patient was advised to call back or seek an in-person evaluation if the symptoms worsen or if the condition fails to improve as anticipated.  I provided face-to-face time during this encounter. We were at different locations.   Walker Kehr, MD

## 2019-06-14 NOTE — Assessment & Plan Note (Addendum)
Rx renewed x 1 mo  - oxy

## 2019-06-14 NOTE — Progress Notes (Signed)
Subjective:  Patient ID: Beth Huffman, female    DOB: 01/20/1961  Age: 58 y.o. MRN: [id number]  CC: No chief complaint on file.   HPI Beth Huffman presents for ST x 2 d and R earache, white on the R tonsil; chills. F/u LBP  Outpatient Medications Prior to Visit  Medication Sig Dispense Refill  . cholecalciferol (VITAMIN D) 1000 UNITS tablet Take 1,000 Units by mouth daily.      . cyclobenzaprine (FLEXERIL) 5 MG tablet TAKE 1 TABLET THREE TIMES DAILY AS NEEDED FOR MUSCLE SPASMS. 90 tablet 1  . estradiol (VIVELLE-DOT) 0.05 MG/24HR patch Place 1 patch (0.05 mg total) onto the skin 2 (two) times a week. 24 patch 4  . fluticasone (FLONASE) 50 MCG/ACT nasal spray Place 2 sprays into both nostrils daily. 16 g 0  . Multiple Vitamin (MULTIVITAMIN PO) Take 1 tablet by mouth daily.      . naproxen (NAPROSYN) 500 MG tablet Take 1 tablet (500 mg total) by mouth 2 (two) times daily as needed. 20 tablet 3  . ondansetron (ZOFRAN) 8 MG tablet TAKE 1 TABLET EVERY 12 HOURS IF NEEDED FOR NAUSEA. 20 tablet 2  . oseltamivir (TAMIFLU) 75 MG capsule Take 1 capsule (75 mg total) by mouth daily. (Patient not taking: Reported on 02/10/2019) 10 capsule 0  . Oxycodone HCl 10 MG TABS Take 1 tablet (10 mg total) by mouth 4 (four) times daily as needed. 120 tablet 0  . Oxycodone HCl 10 MG TABS Take 1 tablet (10 mg total) by mouth every 6 (six) hours as needed. 120 tablet 0  . Oxycodone HCl 10 MG TABS Take 1 tablet (10 mg total) by mouth every 6 (six) hours as needed. 120 tablet 0  . pantoprazole (PROTONIX) 40 MG tablet TAKE 1 TABLET EACH DAY. (Patient taking differently: Take 40 mg by mouth daily. ) 30 tablet 11  . polyethylene glycol (MIRALAX / GLYCOLAX) packet Take 8.5 g by mouth every other day.    . rizatriptan (MAXALT) 10 MG tablet Take 1 tablet (10 mg total) by mouth once as needed for up to 1 dose for migraine. May repeat in 2 hours if needed 8 tablet 12  . SUMAtriptan (IMITREX) 100 MG tablet TAKE 1  TABLET AT ONSET OF MIGRAINE- MAY REPEAT ONCE IN 2 HOURS. LIMIT2/24 HOURS. AVOID DAILY USE. 9 tablet 5  . verapamil (CALAN-SR) 120 MG CR tablet TAKE ONE TABLET AT BEDTIME. (Patient taking differently: Take 120 mg by mouth at bedtime. ) 30 tablet 10   No facility-administered medications prior to visit.     ROS: Review of Systems  Objective:  There were no vitals taken for this visit.  BP Readings from Last 3 Encounters:  06/14/19 129/78  02/10/19 128/70  12/27/18 124/86    Wt Readings from Last 3 Encounters:  06/14/19 155 lb (70.3 kg)  02/10/19 154 lb (69.9 kg)  12/27/18 165 lb (74.8 kg)    Physical Exam  Lab Results  Component Value Date   WBC 4.1 08/31/2018   HGB 13.2 08/31/2018   HCT 41.3 08/31/2018   PLT 213 08/31/2018   GLUCOSE 87 08/31/2018   CHOL 189 05/04/2017   TRIG 129.0 05/04/2017   HDL 52.40 05/04/2017   LDLDIRECT 130.2 12/21/2013   LDLCALC 111 (H) 05/04/2017   ALT 35 08/03/2018   AST 29 08/03/2018   NA 142 08/31/2018   K 3.8 08/31/2018   CL 111 08/31/2018   CREATININE 0.98 08/31/2018   BUN 16  08/31/2018   CO2 24 08/31/2018   TSH 1.26 03/31/2018   HGBA1C 5.5 03/31/2018    Mm 3d Screen Breast Bilateral  Result Date: 12/21/2018 CLINICAL DATA:  Screening. EXAM: DIGITAL SCREENING BILATERAL MAMMOGRAM WITH TOMO AND CAD COMPARISON:  Previous exam(s). ACR Breast Density Category b: There are scattered areas of fibroglandular density. FINDINGS: There are no findings suspicious for malignancy. Images were processed with CAD. IMPRESSION: No mammographic evidence of malignancy. A result letter of this screening mammogram will be mailed directly to the patient. RECOMMENDATION: Screening mammogram in one year. (Code:SM-B-01Y) BI-RADS CATEGORY  1: Negative. Electronically Signed   By: Curlene Dolphin M.D.   On: 12/21/2018 11:41    Assessment & Plan:   There are no diagnoses linked to this encounter.   No orders of the defined types were placed in this encounter.     Follow-up: No follow-ups on file.  Walker Kehr, MD

## 2019-06-14 NOTE — Assessment & Plan Note (Addendum)
Acute 8/20 Zpac

## 2019-06-20 ENCOUNTER — Encounter: Payer: Self-pay | Admitting: *Deleted

## 2019-06-20 NOTE — Progress Notes (Signed)
Call ref# [place] phone# [phone].  She looked up [id number] and [phone] and stated neither of those codes require a prior authorization.

## 2019-06-24 ENCOUNTER — Ambulatory Visit (INDEPENDENT_AMBULATORY_CARE_PROVIDER_SITE_OTHER): Payer: PPO | Admitting: Neurology

## 2019-06-24 ENCOUNTER — Other Ambulatory Visit: Payer: Self-pay

## 2019-06-24 DIAGNOSIS — G43709 Chronic migraine without aura, not intractable, without status migrainosus: Secondary | ICD-10-CM

## 2019-06-24 MED ORDER — ONABOTULINUMTOXINA 100 UNITS IJ SOLR
155.0000 [IU] | Freq: Once | INTRAMUSCULAR | Status: AC
  Administered 2019-06-24: 155 [IU] via INTRAMUSCULAR

## 2019-06-24 NOTE — Progress Notes (Signed)
[facility]   Procedure Note Botox  Attending: Dr. Lakeyn Dokken  Preoperative Diagnosis(es): Chronic migraine  Consent obtained from: The patient Benefits discussed included, but were not limited to decreased muscle tightness, increased joint range of motion, and decreased pain.  Risk discussed included, but were not limited pain and discomfort, bleeding, bruising, excessive weakness, venous thrombosis, muscle atrophy and dysphagia.  Anticipated outcomes of the procedure as well as he risks and benefits of the alternatives to the procedure, and the roles and tasks of the personnel to be involved, were discussed with the patient, and the patient consents to the procedure and agrees to proceed. A copy of the patient medication guide was given to the patient which explains the blackbox warning.  Patients identity and treatment sites confirmed Yes.  Details of Procedure: Skin was cleaned with alcohol. Prior to injection, the needle plunger was aspirated to make sure the needle was not within a blood vessel.  There was no blood retrieved on aspiration.    Following is a summary of the muscles injected  And the amount of Botulinum toxin used:  Dilution 200 units of Botox was reconstituted with 4 ml of preservative free normal saline. Time of reconstitution: At the time of the office visit (<30 minutes prior to injection)   Injections  155 total units of Botox was injected with a 30 gauge needle.  Injection Sites: L occipitalis: 15 units- 3 sites  R occiptalis: 15 units- 3 sites  L upper trapezius: 15 units- 3 sites R upper trapezius: 15 units- 3 sits          L paraspinal: 10 units- 2 sites R paraspinal: 10 units- 2 sites  Face L frontalis(2 injection sites):10 units   R frontalis(2 injection sites):10 units         L corrugator: 5 units   R corrugator: 5 units           Procerus: 5 units   L temporalis: 20 units R temporalis: 20 units   Agent:  200 units of botulinum Type A  (Onobotulinum Toxin type A) was reconstituted with 4 ml of preservative free normal saline.  Time of reconstitution: At the time of the office visit (<30 minutes prior to injection)     Total injected (Units): 155  Total wasted (Units): 7.5  Patient tolerated procedure well without complications.   Reinjection is anticipated in 3 months.    

## 2019-06-24 NOTE — Progress Notes (Signed)
7.5 waste

## 2019-06-28 ENCOUNTER — Ambulatory Visit: Payer: PPO | Admitting: Internal Medicine

## 2019-07-06 NOTE — Telephone Encounter (Signed)
I called the patient to check status of her coming to get the medication and she stated she did not want the medication.

## 2019-07-20 ENCOUNTER — Other Ambulatory Visit: Payer: Self-pay

## 2019-07-20 ENCOUNTER — Ambulatory Visit: Payer: PPO | Admitting: Internal Medicine

## 2019-07-20 ENCOUNTER — Encounter: Payer: Self-pay | Admitting: Internal Medicine

## 2019-07-20 VITALS — BP 110/70 | HR 80 | Ht 66.0 in | Wt 157.6 lb

## 2019-07-20 DIAGNOSIS — I10 Essential (primary) hypertension: Secondary | ICD-10-CM

## 2019-07-20 DIAGNOSIS — I471 Supraventricular tachycardia: Secondary | ICD-10-CM

## 2019-07-20 MED ORDER — VERAPAMIL HCL ER 120 MG PO TBCR: 120.0000 mg | EXTENDED_RELEASE_TABLET | Freq: Every day | ORAL | 11 refills | Status: DC
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
## 2019-07-20 NOTE — Patient Instructions (Addendum)
Medication Instructions:  Your physician recommends that you continue on your current medications as directed. Please refer to the Current Medication list given to you today.  * If you need a refill on your cardiac medications before your next appointment, please call your pharmacy.   Labwork: None ordered.  Testing/Procedures: None ordered  Follow-Up: No follow up is needed at this time with Dr. Caryl Comes.  He will see you on an as needed basis.   Thank you for choosing [facility]!!

## 2019-07-26 ENCOUNTER — Encounter: Payer: Self-pay | Admitting: Gynecology

## 2019-07-28 ENCOUNTER — Encounter: Payer: Self-pay | Admitting: Internal Medicine

## 2019-07-28 ENCOUNTER — Ambulatory Visit (INDEPENDENT_AMBULATORY_CARE_PROVIDER_SITE_OTHER): Payer: PPO | Admitting: Internal Medicine

## 2019-07-28 DIAGNOSIS — M5441 Lumbago with sciatica, right side: Secondary | ICD-10-CM

## 2019-07-28 DIAGNOSIS — M5442 Lumbago with sciatica, left side: Secondary | ICD-10-CM | POA: Diagnosis not present

## 2019-07-28 DIAGNOSIS — G8929 Other chronic pain: Secondary | ICD-10-CM | POA: Diagnosis not present

## 2019-07-28 DIAGNOSIS — G43719 Chronic migraine without aura, intractable, without status migrainosus: Secondary | ICD-10-CM

## 2019-07-28 DIAGNOSIS — K219 Gastro-esophageal reflux disease without esophagitis: Secondary | ICD-10-CM

## 2019-07-28 MED ORDER — OXYCODONE HCL 10 MG PO TABS: 10.0000 mg | ORAL_TABLET | Freq: Four times a day (QID) | ORAL | 0 refills | Status: DC | PRN

## 2019-07-28 MED ORDER — VERAPAMIL HCL ER 120 MG PO TBCR: 120.0000 mg | EXTENDED_RELEASE_TABLET | Freq: Every day | ORAL | 3 refills | Status: DC

## 2019-07-28 MED ORDER — RIZATRIPTAN BENZOATE 10 MG PO TABS: 10.0000 mg | ORAL_TABLET | Freq: Once | ORAL | 3 refills | Status: DC | PRN

## 2019-07-28 MED ORDER — PANTOPRAZOLE SODIUM 40 MG PO TBEC: 40.0000 mg | DELAYED_RELEASE_TABLET | Freq: Every day | ORAL | 3 refills | Status: DC

## 2019-07-28 MED ORDER — ONDANSETRON HCL 8 MG PO TABS: 8.0000 mg | ORAL_TABLET | Freq: Three times a day (TID) | ORAL | 3 refills | Status: DC | PRN

## 2019-07-28 NOTE — Assessment & Plan Note (Signed)
On Oxycodone  Potential benefits of a long term opioids use as well as potential risks (i.e. addiction risk, apnea etc) and complications (i.e. Somnolence, constipation and others) were explained to the patient and were aknowledged.  

## 2019-07-28 NOTE — Progress Notes (Signed)
Virtual Visit via Video Note  I connected with Beth Huffman on 07/28/19 at  8:50 AM EDT by a video enabled telemedicine application and verified that I am speaking with the correct person using two identifiers.   I discussed the limitations of evaluation and management by telemedicine and the availability of in person appointments. The patient expressed understanding and agreed to proceed.  History of Present Illness: We need to follow-up on chronic pain, HAs, GERD f/u  There has been no runny nose, cough, chest pain, shortness of breath, abdominal pain, diarrhea, constipation,skin rashes.   Observations/Objective: The patient appears to be in no acute distress, looks well.  Assessment and Plan:  See my Assessment and Plan. Follow Up Instructions:    I discussed the assessment and treatment plan with the patient. The patient was provided an opportunity to ask questions and all were answered. The patient agreed with the plan and demonstrated an understanding of the instructions.   The patient was advised to call back or seek an in-person evaluation if the symptoms worsen or if the condition fails to improve as anticipated.  I provided face-to-face time during this encounter. We were at different locations.   Walker Kehr, MD

## 2019-07-28 NOTE — Assessment & Plan Note (Signed)
Protonix prn  Potential benefits of a long term PPI use as well as potential risks  and complications were explained to the patient and were aknowledged.

## 2019-07-28 NOTE — Assessment & Plan Note (Signed)
[place] 

## 2019-07-29 ENCOUNTER — Other Ambulatory Visit: Payer: Self-pay

## 2019-07-29 ENCOUNTER — Ambulatory Visit (INDEPENDENT_AMBULATORY_CARE_PROVIDER_SITE_OTHER): Payer: PPO

## 2019-07-29 ENCOUNTER — Telehealth: Payer: Self-pay

## 2019-07-29 DIAGNOSIS — Z23 Encounter for immunization: Secondary | ICD-10-CM

## 2019-07-29 NOTE — Telephone Encounter (Signed)
Ordered, pt notified.

## 2019-07-29 NOTE — Telephone Encounter (Signed)
Pt would like to know if she would be able to do the cologuard. Please advise

## 2019-07-29 NOTE — Telephone Encounter (Signed)
Okay with me.  Please order.  Thanks

## 2019-08-06 ENCOUNTER — Other Ambulatory Visit: Payer: Self-pay | Admitting: Internal Medicine

## 2019-09-06 ENCOUNTER — Other Ambulatory Visit: Payer: Self-pay | Admitting: Neurology

## 2019-09-20 ENCOUNTER — Ambulatory Visit (INDEPENDENT_AMBULATORY_CARE_PROVIDER_SITE_OTHER)
Admission: RE | Admit: 2019-09-20 | Discharge: 2019-09-20 | Disposition: A | Discharge status: Home / Self Care | Payer: PPO | Source: Ambulatory Visit | Attending: Family | Admitting: Family

## 2019-09-20 ENCOUNTER — Other Ambulatory Visit: Payer: Self-pay

## 2019-09-20 ENCOUNTER — Ambulatory Visit (HOSPITAL_COMMUNITY)
Admission: RE | Admit: 2019-09-20 | Discharge: 2019-09-20 | Disposition: A | Discharge status: Home / Self Care | Payer: PPO | Source: Ambulatory Visit | Attending: Cardiology | Admitting: Cardiology

## 2019-09-20 ENCOUNTER — Other Ambulatory Visit: Payer: Self-pay | Admitting: Family

## 2019-09-20 ENCOUNTER — Encounter: Payer: Self-pay | Admitting: Family

## 2019-09-20 ENCOUNTER — Ambulatory Visit (INDEPENDENT_AMBULATORY_CARE_PROVIDER_SITE_OTHER): Payer: PPO | Admitting: Family

## 2019-09-20 VITALS — BP 118/76 | HR 91 | Temp 98.2°F | Ht 66.0 in | Wt 160.4 lb

## 2019-09-20 DIAGNOSIS — M7989 Other specified soft tissue disorders: Secondary | ICD-10-CM | POA: Diagnosis not present

## 2019-09-20 DIAGNOSIS — Z1211 Encounter for screening for malignant neoplasm of colon: Secondary | ICD-10-CM | POA: Diagnosis not present

## 2019-09-20 DIAGNOSIS — M79642 Pain in left hand: Secondary | ICD-10-CM

## 2019-09-20 DIAGNOSIS — R2232 Localized swelling, mass and lump, left upper limb: Secondary | ICD-10-CM | POA: Diagnosis not present

## 2019-09-20 DIAGNOSIS — Z1212 Encounter for screening for malignant neoplasm of rectum: Secondary | ICD-10-CM | POA: Diagnosis not present
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
## 2019-09-21 LAB — COLOGUARD: Cologuard: NEGATIVE

## 2019-09-30 ENCOUNTER — Ambulatory Visit: Payer: PPO | Admitting: Neurology

## 2019-10-06 NOTE — Telephone Encounter (Signed)
Error

## 2019-10-14 ENCOUNTER — Ambulatory Visit: Payer: PPO | Admitting: Neurology

## 2019-10-25 ENCOUNTER — Encounter: Payer: Self-pay | Admitting: Internal Medicine

## 2019-10-31 ENCOUNTER — Other Ambulatory Visit: Payer: Self-pay

## 2019-10-31 ENCOUNTER — Encounter: Payer: Self-pay | Admitting: Internal Medicine

## 2019-10-31 ENCOUNTER — Ambulatory Visit (INDEPENDENT_AMBULATORY_CARE_PROVIDER_SITE_OTHER): Payer: PPO | Admitting: Internal Medicine

## 2019-10-31 VITALS — BP 134/82 | HR 69 | Temp 98.1°F | Ht 66.0 in | Wt 160.0 lb

## 2019-10-31 DIAGNOSIS — G8929 Other chronic pain: Secondary | ICD-10-CM | POA: Diagnosis not present

## 2019-10-31 DIAGNOSIS — E785 Hyperlipidemia, unspecified: Secondary | ICD-10-CM | POA: Diagnosis not present

## 2019-10-31 DIAGNOSIS — R531 Weakness: Secondary | ICD-10-CM | POA: Diagnosis not present

## 2019-10-31 DIAGNOSIS — M5441 Lumbago with sciatica, right side: Secondary | ICD-10-CM

## 2019-10-31 DIAGNOSIS — G43719 Chronic migraine without aura, intractable, without status migrainosus: Secondary | ICD-10-CM | POA: Diagnosis not present

## 2019-10-31 DIAGNOSIS — G950 Syringomyelia and syringobulbia: Secondary | ICD-10-CM | POA: Diagnosis not present

## 2019-10-31 DIAGNOSIS — M5442 Lumbago with sciatica, left side: Secondary | ICD-10-CM | POA: Diagnosis not present

## 2019-10-31 DIAGNOSIS — R635 Abnormal weight gain: Secondary | ICD-10-CM | POA: Diagnosis not present

## 2019-10-31 LAB — HEPATIC FUNCTION PANEL
ALT: 29 U/L (ref 0–35)
AST: 34 U/L (ref 0–37)
Albumin: 4.3 g/dL (ref 3.5–5.2)
Alkaline Phosphatase: 79 U/L (ref 39–117)
Bilirubin, Direct: 0.1 mg/dL (ref 0.0–0.3)
Total Bilirubin: 0.4 mg/dL (ref 0.2–1.2)
Total Protein: 6.7 g/dL (ref 6.0–8.3)

## 2019-10-31 LAB — LIPID PANEL
Cholesterol: 199 mg/dL (ref 0–200)
HDL: 64.6 mg/dL (ref >39.00)
LDL Cholesterol: 120 mg/dL — ABNORMAL HIGH (ref 0–99)
NonHDL: 134.26
Total CHOL/HDL Ratio: 3
Triglycerides: 72 mg/dL (ref 0.0–149.0)
VLDL: 14.4 mg/dL (ref 0.0–40.0)

## 2019-10-31 LAB — URINALYSIS
Bilirubin Urine: NEGATIVE
Hgb urine dipstick: NEGATIVE
Ketones, ur: NEGATIVE
Leukocytes,Ua: NEGATIVE
Nitrite: NEGATIVE
Specific Gravity, Urine: <=1.005 — AB (ref 1.000–1.030)
Total Protein, Urine: NEGATIVE
Urine Glucose: NEGATIVE
Urobilinogen, UA: 0.2 (ref 0.0–1.0)
pH: 6 (ref 5.0–8.0)

## 2019-10-31 LAB — BASIC METABOLIC PANEL
BUN: 14 mg/dL (ref 6–23)
CO2: 28 mEq/L (ref 19–32)
Calcium: 9.2 mg/dL (ref 8.4–10.5)
Chloride: 104 mEq/L (ref 96–112)
Creatinine, Ser: 0.81 mg/dL (ref 0.40–1.20)
GFR: 72.21 mL/min (ref >60.00)
Glucose, Bld: 86 mg/dL (ref 70–99)
Potassium: 4 mEq/L (ref 3.5–5.1)
Sodium: 139 mEq/L (ref 135–145)

## 2019-10-31 LAB — CBC WITH DIFFERENTIAL/PLATELET
Basophils Absolute: 0.1 10*3/uL (ref 0.0–0.1)
Basophils Relative: 1.3 % (ref 0.0–3.0)
Eosinophils Absolute: 0.1 10*3/uL (ref 0.0–0.7)
Eosinophils Relative: 2.2 % (ref 0.0–5.0)
HCT: 39.3 % (ref 36.0–46.0)
Hemoglobin: 13 g/dL (ref 12.0–15.0)
Lymphocytes Relative: 35.6 % (ref 12.0–46.0)
Lymphs Abs: 1.5 10*3/uL (ref 0.7–4.0)
MCHC: 33 g/dL (ref 30.0–36.0)
MCV: 93.7 fl (ref 78.0–100.0)
Monocytes Absolute: 0.2 10*3/uL (ref 0.1–1.0)
Monocytes Relative: 4 % (ref 3.0–12.0)
Neutro Abs: 2.5 10*3/uL (ref 1.4–7.7)
Neutrophils Relative %: 56.9 % (ref 43.0–77.0)
Platelets: 195 10*3/uL (ref 150.0–400.0)
RBC: 4.19 Mil/uL (ref 3.87–5.11)
RDW: 13 % (ref 11.5–15.5)
WBC: 4.3 10*3/uL (ref 4.0–10.5)

## 2019-10-31 LAB — TSH: TSH: 1.4 u[IU]/mL (ref 0.35–4.50)

## 2019-10-31 MED ORDER — OXYCODONE HCL 10 MG PO TABS: 10.0000 mg | ORAL_TABLET | Freq: Four times a day (QID) | ORAL | 0 refills | Status: DC | PRN

## 2019-10-31 NOTE — Assessment & Plan Note (Signed)
F/u w/Neurology Treating chronic pain

## 2019-10-31 NOTE — Assessment & Plan Note (Signed)
Wt Readings from Last 3 Encounters:  10/31/19 160 lb (72.6 kg)  09/20/19 160 lb 6.4 oz (72.8 kg)  07/20/19 157 lb 9.6 oz (71.5 kg)

## 2019-10-31 NOTE — Progress Notes (Signed)
Subjective:  Patient ID: Beth Huffman, female    DOB: October 11, 1961  Age: 59 y.o. MRN: [id number]  CC: No chief complaint on file.   HPI Beth Huffman presents for chronic LBP, HAs, HTN f/u  Outpatient Medications Prior to Visit  Medication Sig Dispense Refill  . cholecalciferol (VITAMIN D) 1000 UNITS tablet Take 1,000 Units by mouth daily.      . cyclobenzaprine (FLEXERIL) 5 MG tablet TAKE 1 TABLET THREE TIMES DAILY AS NEEDED FOR MUSCLE SPASMS. 90 tablet 1  . estradiol (VIVELLE-DOT) 0.05 MG/24HR patch Place 1 patch (0.05 mg total) onto the skin 2 (two) times a week. 24 patch 4  . Multiple Vitamin (MULTIVITAMIN PO) Take 1 tablet by mouth daily.      . naproxen (NAPROSYN) 500 MG tablet TAKE (1) TABLET TWICE A DAY AS NEEDED. 20 tablet 0  . ondansetron (ZOFRAN) 8 MG tablet Take 1 tablet (8 mg total) by mouth every 8 (eight) hours as needed for nausea or vomiting. 21 tablet 3  . Oxycodone HCl 10 MG TABS Take 1 tablet (10 mg total) by mouth 4 (four) times daily as needed. 120 tablet 0  . Oxycodone HCl 10 MG TABS Take 1 tablet (10 mg total) by mouth every 6 (six) hours as needed. 120 tablet 0  . Oxycodone HCl 10 MG TABS Take 1 tablet (10 mg total) by mouth every 6 (six) hours as needed. 120 tablet 0  . pantoprazole (PROTONIX) 40 MG tablet Take 1 tablet (40 mg total) by mouth daily. 90 tablet 3  . polyethylene glycol (MIRALAX / GLYCOLAX) packet Take 8.5 g by mouth every other day.    . rizatriptan (MAXALT) 10 MG tablet Take 1 tablet (10 mg total) by mouth once as needed for up to 1 dose for migraine. May repeat in 2 hours if needed 24 tablet 3  . SUMAtriptan (IMITREX) 100 MG tablet TAKE 1 TABLET AT ONSET OF MIGRAINE- MAY REPEAT ONCE IN 2 HOURS. LIMIT2/24 HOURS. AVOID DAILY USE. 9 tablet 5  . verapamil (CALAN-SR) 120 MG CR tablet Take 1 tablet (120 mg total) by mouth at bedtime. 90 tablet 3   No facility-administered medications prior to visit.    ROS: Review of Systems  Constitutional:  Negative for activity change, appetite change, chills, fatigue and unexpected weight change.  HENT: Negative for congestion, mouth sores and sinus pressure.   Eyes: Negative for visual disturbance.  Respiratory: Negative for cough and chest tightness.   Gastrointestinal: Negative for abdominal pain and nausea.  Genitourinary: Negative for difficulty urinating, frequency and vaginal pain.  Musculoskeletal: Positive for back pain and gait problem.  Skin: Negative for pallor and rash.  Neurological: Positive for headaches. Negative for dizziness, tremors, weakness and numbness.  Psychiatric/Behavioral: Negative for confusion, sleep disturbance and suicidal ideas.    Objective:  BP 134/82 (BP Location: Left Arm, Patient Position: Sitting, Cuff Size: Normal)   Pulse 69   Temp 98.1 F (36.7 C) (Oral) Comment (Src): o  Ht 5\' 6"  (1.676 m)   Wt 160 lb (72.6 kg)   SpO2 97%   BMI 25.82 kg/m   BP Readings from Last 3 Encounters:  10/31/19 134/82  09/20/19 118/76  07/20/19 110/70    Wt Readings from Last 3 Encounters:  10/31/19 160 lb (72.6 kg)  09/20/19 160 lb 6.4 oz (72.8 kg)  07/20/19 157 lb 9.6 oz (71.5 kg)    Physical Exam Constitutional:      General: She is not in acute  distress.    Appearance: She is well-developed.  HENT:     Head: Normocephalic.     Right Ear: External ear normal.     Left Ear: External ear normal.     Nose: Nose normal.  Eyes:     General:        Right eye: No discharge.        Left eye: No discharge.     Conjunctiva/sclera: Conjunctivae normal.     Pupils: Pupils are equal, round, and reactive to light.  Neck:     Thyroid: No thyromegaly.     Vascular: No JVD.     Trachea: No tracheal deviation.  Cardiovascular:     Rate and Rhythm: Normal rate and regular rhythm.     Heart sounds: Normal heart sounds.  Pulmonary:     Effort: No respiratory distress.     Breath sounds: No stridor. No wheezing.  Abdominal:     General: Bowel sounds are  normal. There is no distension.     Palpations: Abdomen is soft. There is no mass.     Tenderness: There is abdominal tenderness. There is no guarding or rebound.  Musculoskeletal:        General: No tenderness.     Cervical back: Normal range of motion and neck supple.  Lymphadenopathy:     Cervical: No cervical adenopathy.  Skin:    Findings: No erythema or rash.  Neurological:     Cranial Nerves: No cranial nerve deficit.     Motor: No abnormal muscle tone.     Coordination: Coordination normal.     Deep Tendon Reflexes: Reflexes normal.  Psychiatric:        Behavior: Behavior normal.        Thought Content: Thought content normal.        Judgment: Judgment normal.   LS tender  Lab Results  Component Value Date   WBC 4.1 08/31/2018   HGB 13.2 08/31/2018   HCT 41.3 08/31/2018   PLT 213 08/31/2018   GLUCOSE 87 08/31/2018   CHOL 189 05/04/2017   TRIG 129.0 05/04/2017   HDL 52.40 05/04/2017   LDLDIRECT 130.2 12/21/2013   LDLCALC 111 (H) 05/04/2017   ALT 35 08/03/2018   AST 29 08/03/2018   NA 142 08/31/2018   K 3.8 08/31/2018   CL 111 08/31/2018   CREATININE 0.98 08/31/2018   BUN 16 08/31/2018   CO2 24 08/31/2018   TSH 1.26 03/31/2018   HGBA1C 5.5 03/31/2018    DG Hand Complete Left  Result Date: 09/20/2019 CLINICAL DATA:  59 year old female with pain and swelling of the fourth MCP joint. No known injury. EXAM: LEFT HAND - COMPLETE 3+ VIEW COMPARISON:  None. FINDINGS: There is no acute fracture or dislocation. Mild juxta-articular osteopenia. Mild arthritic changes of the PIP and DIP joints. There is soft tissue swelling over the MCP joints. No radiopaque foreign object or soft tissue gas. IMPRESSION: 1. No acute fracture or dislocation. 2. Soft tissue swelling over the MCP joints. Electronically Signed   By: Anner Crete M.D.   On: 09/20/2019 09:42   VAS [place] UPPER EXTREMITY VENOUS DUPLEX  Result Date: 09/21/2019 UPPER VENOUS STUDY  Other Indications: Patient  woke up this morning with sudden onset swelling and bruising of the left hand. There is no pain or swelling of the upper arm. The swelling has gone down since this morning but the hand still remains tender and bruised. Risk Factors: None identified. Performing Technologist: Mariane Masters  RVT  Examination Guidelines: A complete evaluation includes B-mode imaging, spectral Doppler, color Doppler, and power Doppler as needed of all accessible portions of each vessel. Bilateral testing is considered an integral part of a complete examination. Limited examinations for reoccurring indications may be performed as noted.  Right Findings: +----------+------------+---------+-----------+----------+-------+ RIGHT     CompressiblePhasicitySpontaneousPropertiesSummary +----------+------------+---------+-----------+----------+-------+ IJV           Full       Yes       Yes                      +----------+------------+---------+-----------+----------+-------+ Subclavian               Yes       Yes                      +----------+------------+---------+-----------+----------+-------+  Left Findings: +----------+------------+---------+-----------+----------+-------+ LEFT      CompressiblePhasicitySpontaneousPropertiesSummary +----------+------------+---------+-----------+----------+-------+ IJV           Full       Yes       Yes                      +----------+------------+---------+-----------+----------+-------+ Subclavian               Yes       Yes                      +----------+------------+---------+-----------+----------+-------+ Axillary      Full       Yes       Yes                      +----------+------------+---------+-----------+----------+-------+ Brachial      Full       Yes       Yes                      +----------+------------+---------+-----------+----------+-------+ Radial        Full       Yes       Yes                       +----------+------------+---------+-----------+----------+-------+ Ulnar         Full       Yes       Yes                      +----------+------------+---------+-----------+----------+-------+ Cephalic      Full                                          +----------+------------+---------+-----------+----------+-------+ Basilic       Full       Yes       Yes                      +----------+------------+---------+-----------+----------+-------+ Innominate               Yes       Yes                      +----------+------------+---------+-----------+----------+-------+  Summary:  Right: No evidence of thrombosis in the subclavian.  Left: No evidence of deep vein thrombosis in the upper extremity. No evidence of superficial vein  thrombosis in the upper extremity.  *See table(s) above for measurements and observations.  Diagnosing physician: Kathlyn Sacramento MD Electronically signed by Kathlyn Sacramento MD on 09/21/2019 at 2:45:08 PM.    Final     Assessment & Plan:   There are no diagnoses linked to this encounter.   No orders of the defined types were placed in this encounter.    Follow-up: No follow-ups on file.  Walker Kehr, MD

## 2019-10-31 NOTE — Assessment & Plan Note (Signed)
Botox Maxalt prn

## 2019-10-31 NOTE — Assessment & Plan Note (Signed)
On Oxycodone  Potential benefits of a long term opioids use as well as potential risks (i.e. addiction risk, apnea etc) and complications (i.e. Somnolence, constipation and others) were explained to the patient and were aknowledged.  

## 2019-11-04 ENCOUNTER — Ambulatory Visit: Payer: PPO | Admitting: Neurology

## 2019-11-06 ENCOUNTER — Other Ambulatory Visit: Payer: Self-pay | Admitting: Internal Medicine

## 2019-11-06 ENCOUNTER — Other Ambulatory Visit: Payer: Self-pay | Admitting: Neurology

## 2019-11-07 ENCOUNTER — Telehealth: Payer: PPO | Admitting: Neurology

## 2019-11-28 NOTE — Progress Notes (Signed)
Virtual Visit via Video Note The purpose of this virtual visit is to provide medical care while limiting exposure to the novel coronavirus.    Consent was obtained for video visit:  Yes.   Answered questions that patient had about telehealth interaction:  Yes.   I discussed the limitations, risks, security and privacy concerns of performing an evaluation and management service by telemedicine. I also discussed with the patient that there may be a patient responsible charge related to this service. The patient expressed understanding and agreed to proceed.  Pt location: Home Physician Location: office Name of referring provider:  Plotnikov, Evie Lacks, MD I connected with Beth Huffman at patients initiation/request on 11/30/2019 at 11:10 AM EST by video enabled telemedicine application and verified that I am speaking with the correct person using two identifiers. Pt MRN:  [id number] Pt DOB:  June 16, 1961 Video Participants:  Beth Huffman   History of Present Illness:  Beth Huffman is a 59 year old right-handed Caucasian woman who follows up for migraines.  UPDATE: She had one round of Botox in August.  However, she cancelled subsequent appointments due to concerns regarding Covid.  She is ready to restart it. Intensity:  severe Duration:  1 hour Frequency:  15 days a month Current NSAIDS:ibuprofen, naproxen (take with triptan) Current analgesics:oxycodone Current triptans:Maxalt 10mg  OR sumatriptan 100mg  Current ergotamine:none Current anti-emetic:Zofran 8mg  Current muscle relaxants:Flexeril 5mg  Current anti-anxiolytic:none Current sleep aide:none Current Antihypertensive medications:Verapamil CR 128mg  Current Antidepressant medications:none Current Anticonvulsant medications:none Current anti-CGRP:none Current Vitamins/Herbal/Supplements:none Current Antihistamines/Decongestants:Flonase Other therapy:none Hormone/birth  control:estradiol  Caffeine:1 1/2 cups coffee daily Diet:Keeps hydrated. Does not skip meals. On Optivia diet. Lost 30 lbs since January Exercise:Walks dogs Depression:no; Anxiety:no Other pain:Spinal pain related to perineural cysts. Recent knee replacement, still with mild soreness Sleep hygiene:good  HISTORY: Onset:  Since the early 1990s. She was evaluated at [facility]. 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 MRI of spine from 2005 showed a syrinx from T3 to T12 levels. She subsequently underwent occipital posterior fossa decompression surgery that year. A CT cystogram in 2007 showed residual cyst in the posterior fossa with postoperative changes but no hydrocephalus or other intracranial mass. Headaches subsided following decompression. They started to return in May 2016. She was previouslyfollowed by Dr[facility] Krista Blue at [facility]. Location:Bifrontal/posterior neck pain Quality:Pressure, throbbing Initial intensity:10/10.shedenies new headache, thunderclap headache or severe headache that wakes herfrom sleep. Aura:no Premonitory Phase:no Postdrome:no Associated symptoms:With or without nausea, photophobia, phonophobia.Shedenies associated unilateral numbness or weakness. Initial Duration:1-2 days. Often in the morning when she wakes up Initial Frequency:15 days a month Initial Frequency of abortive medication:sumatriptan or rizatriptan 12-15 days a month Triggers:  Prolonged standing or activity/lifting Relieving factors:  Just medications. Activity:Can't function  Past NSAIDS:Cambia, naproxen Past analgesics:Excedrin, Tylenol Past abortive triptans:Relpax 40mg , Zomig 5mg  NS, sumatriptan [place] Past abortive ergotamine:none Past muscle  relaxants:none Past anti-emetic:none Past antihypertensive medications:atenolol Past antidepressant medications:Nortriptyline (did not feel well on it) Past anticonvulsant medications:Trokendi XR,topiramate 200mg  twice daily;  gabapentin, Lyrica Past anti-CGRP:Emgality (palpitations) Past vitamins/Herbal/Supplements:none Past antihistamines/decongestants:none Other past therapies:Botox(effective)  Family history of headache:No  Past Medical History: Past Medical History:  Diagnosis Date  . Abnormal weight gain   . Acute sinusitis 12/05/2008   1/18 8/18  . Arachnoid cyst 08/19/2011  . Chest tightness or pressure 05/07/2018  . Constipation 09/08/2012   Chronic -  opioid related 11/17 [place]  . Contact dermatitis 03/16/2013   5/14 relapsing - poison ivy   . Dyspnea 05/07/2018  . Elevated liver enzymes 03/04/2011   Chronic off and on   . Female stress incontinence 02/06/2009   Qualifier: Diagnosis of  By: Plotnikov MD, Evie Lacks   . FEVER UNSPECIFIED 09/29/2007   Qualifier: Diagnosis of  By: Plotnikov MD, Evie Lacks   . GERD 09/29/2007   Chronic  Protonix prn  Potential benefits of a long term PPI use as well as potential risks  and complications were explained to the patient and were aknowledged.    Marland Kitchen GERD (gastroesophageal reflux disease)   . Headache 12/20/2007   Dr Hassell Done Migraines (1 per 2 wks) Topamax 400 mg/d; Imitrex prn; Ibuprofen prn, Zofran prn   . Hyperglycemia 08/19/2011   Mild    . Hypertension   . Increased blood pressure (not hypertension) 08/23/2014  . Intractable migraine 06/14/2009   Chronic Ibuprofen, Imitrex, Topamax,  Nortriptyline - intolerant  . Knee pain 03/04/2011   R knee 2018 L knee pain - L knee b anserina and L knee is tender w/ROM: L knee pain is worse - patellofemoral syndrome vs other  . LBP (low back pain)   . LOW BACK PAIN 09/29/2007   Chronic, lately (2016) disabling Due to syringomyelia On Oxycodone  Potential benefits of a long term  opioids use as well as potential risks (i.e. addiction risk, apnea etc) and complications (i.e. Somnolence, constipation and others) were explained to the patient and were aknowledged.    . LUQ PAIN 05/23/2010   Qualifier: Diagnosis of  By: Plotnikov MD, Evie Lacks   . Migraine, chronic, without aura, intractable 10/28/2017   Botox approved diagnosis. 7/19 Worse. Start Emagility inj. Relpax  . Migraines   . Nausea & vomiting 08/19/2011  . Nausea without vomiting 06/14/2009   Due to migraines    . NECK PAIN 06/14/2009    Chronic, lately (2016) disabling Due to syringomyelia On Oxycodone  Potential benefits of a long term opioids use as well as potential risks (i.e. addiction risk, apnea etc) and complications (i.e. Somnolence, constipation and others) were explained to the patient and were aknowledged.    Marland Kitchen NONSPECIFIC ABNORMAL RESULTS LIVR FUNCTION STUDY 09/28/2009   Qualifier: Diagnosis of  By: Plotnikov MD, [place] 05/23/2010   Chronic - compulsive shopping   . Onychomycosis 01/30/2016   4/17   . Orthostatic hypotension 06/30/2013   9/14 likely Rapaflo induced   . OTITIS EXTERNA 05/10/2008   Qualifier: Diagnosis of  By: Plotnikov MD, Evie Lacks   . Palpitations 06/21/2014  . PARESTHESIA 07/13/2008   Qualifier: Diagnosis of  By: Plotnikov MD, Evie Lacks   . Partial thickness burn of left wrist 09/11/2015  . Postoperative state 07/24/2016  . Pyelonephritis 2008  . Rash 07/27/2017  . Stress 2009  . Syncope, near 06/30/2013   9/14 likely Rapaflo induced 12/14 relapsing off Rapaflo x 7 2/15 no relapsing since on 1/2 dose of Topamax   . Syringobulbia ([place])   . SYRINGOMYELIA 08/20/2007   Chronic Dr Krista Blue Dr Jalene Mullet at [facility] Chronic pain   . Syringomyelia ([place])   . Tachycardia   . Tonsillitis, chronic 02/26/2017   2018 worse  . URINARY TRACT INFECTION (UTI) 03/23/2008   Qualifier: Diagnosis of  By: Plotnikov MD, Evie Lacks   . UTI (lower urinary tract infection)   . Well  adult exam 12/21/2013   We discussed age  appropriate health related issues, including available/recomended screening tests and vaccinations. We discussed a need for adhering to healthy diet and exercise. Labs/EKG were reviewed/ordered. All questions were answered.       Medications: Outpatient Encounter Medications as of 11/30/2019  Medication Sig  . cholecalciferol (VITAMIN D) 1000 UNITS tablet Take 1,000 Units by mouth daily.    . cyclobenzaprine (FLEXERIL) 5 MG tablet TAKE 1 TABLET THREE TIMES DAILY AS NEEDED FOR MUSCLE SPASMS.  Marland Kitchen estradiol (VIVELLE-DOT) 0.05 MG/24HR patch Place 1 patch (0.05 mg total) onto the skin 2 (two) times a week.  Marland Kitchen ibuprofen (ADVIL) 600 MG tablet TAKE 1 TABLET EVERY 8 HOURS AS NEEDED FOR HEADACHE.  . Multiple Vitamin (MULTIVITAMIN PO) Take 1 tablet by mouth daily.    . naproxen (NAPROSYN) 500 MG tablet TAKE (1) TABLET TWICE A DAY AS NEEDED.  Marland Kitchen ondansetron (ZOFRAN) 8 MG tablet Take 1 tablet (8 mg total) by mouth every 8 (eight) hours as needed for nausea or vomiting.  . Oxycodone HCl 10 MG TABS Take 1 tablet (10 mg total) by mouth 4 (four) times daily as needed.  . Oxycodone HCl 10 MG TABS Take 1 tablet (10 mg total) by mouth every 6 (six) hours as needed.  . Oxycodone HCl 10 MG TABS Take 1 tablet (10 mg total) by mouth every 6 (six) hours as needed.  . pantoprazole (PROTONIX) 40 MG tablet Take 1 tablet (40 mg total) by mouth daily.  . polyethylene glycol (MIRALAX / GLYCOLAX) packet Take 8.5 g by mouth every other day.  . rizatriptan (MAXALT) 10 MG tablet Take 1 tablet (10 mg total) by mouth once as needed for up to 1 dose for migraine. May repeat in 2 hours if needed  . SUMAtriptan (IMITREX) 100 MG tablet TAKE 1 TABLET AT ONSET OF MIGRAINE- MAY REPEAT ONCE IN 2 HOURS. LIMIT2/24 HOURS. AVOID DAILY USE.  . verapamil (CALAN-SR) 120 MG CR tablet Take 1 tablet (120 mg total) by mouth at bedtime.   No facility-administered encounter medications on file as of 11/30/2019.     Allergies: Allergies  Allergen Reactions  . Atenolol Other (See Comments)    Wt gain  . Cefuroxime Axetil Nausea Only and Other (See Comments)    REACTION: nausea - but tolerates PCN  . Codeine Sulfate Nausea And Vomiting and Nausea Only  . Fentanyl Nausea And Vomiting and Other (See Comments)    REACTION: bad reaction  . Nitrofurantoin Other (See Comments)    Unknown  . Pregabalin Other (See Comments)    REACTION: cp  . Rapaflo [Silodosin] Other (See Comments)    Orthostatic BP drop, near syncope   . Tramadol Hcl Nausea And Vomiting    REACTION: sick  . Iodinated Diagnostic Agents Rash    Family History: Family History  Problem Relation Age of Onset  . COPD Mother   . Peripheral vascular disease Mother   . Hypertension Mother   . Heart disease Mother   . Hypertension Father   . Anxiety disorder Other   . Heart attack Maternal Grandmother   . Stroke Maternal Grandfather   . Stroke Paternal Grandfather   . Colon cancer Maternal Uncle 63  . Breast cancer Maternal Aunt     Social History: Social History   Socioeconomic History  . Marital status: Married    Spouse name: Not on file  . Number of children: 2  . Years of education: College  . Highest education level: Some college, no degree  Occupational History  .  Occupation: Office manager: SELF-EMPLOYED  Tobacco Use  . Smoking status: Never Smoker  . Smokeless tobacco: Never Used  Substance and Sexual Activity  . Alcohol use: No  . Drug use: No  . Sexual activity: Yes  Other Topics Concern  . Not on file  Social History Narrative   Lives at home with her husband.   Right-handed.   1 cup caffeine per day.   Social Determinants of Health   Financial Resource Strain:   . Difficulty of Paying Living Expenses: Not on file  Food Insecurity:   . Worried About Charity fundraiser in the Last Year: Not on file  . Ran Out of Food in the Last Year: Not on file  Transportation Needs:   .  Lack of Transportation (Medical): Not on file  . Lack of Transportation (Non-Medical): Not on file  Physical Activity:   . Days of Exercise per Week: Not on file  . Minutes of Exercise per Session: Not on file  Stress:   . Feeling of Stress : Not on file  Social Connections:   . Frequency of Communication with Friends and Family: Not on file  . Frequency of Social Gatherings with Friends and Family: Not on file  . Attends Religious Services: Not on file  . Active Member of Clubs or Organizations: Not on file  . Attends Archivist Meetings: Not on file  . Marital Status: Not on file  Intimate Partner Violence:   . Fear of Current or Ex-Partner: Not on file  . Emotionally Abused: Not on file  . Physically Abused: Not on file  . Sexually Abused: Not on file    Observations/Objective:   Height 5\' 5"  (1.651 m), weight 140 lb (63.5 kg). No acute distress.  Alert and oriented.  Speech fluent and not dysarthric.  Language intact.  Eyes orthophoric on primary gaze.  Face symmetric.  Assessment and Plan:   1.  Chronic migraine without aura, without status migrainosus, not intractable 2.  Syringomyelia  1.  For preventative management, restart Botox (scheduled for next week). 2.  For abortive therapy, sumatriptan or rizatriptan with naproxen 500mg  3.  Limit use of pain relievers to no more than 2 days out of week to prevent risk of rebound or medication-overuse headache. 4.  Keep headache diary 5.  Exercise, hydration, caffeine cessation, sleep hygiene, monitor for and avoid triggers   Follow Up Instructions:    -I discussed the assessment and treatment plan with the patient. The patient was provided an opportunity to ask questions and all were answered. The patient agreed with the plan and demonstrated an understanding of the instructions.   The patient was advised to call back or seek an in-person evaluation if the symptoms worsen or if the condition fails to improve as  anticipated.  Dudley Major, DO

## 2019-11-29 ENCOUNTER — Encounter: Payer: Self-pay | Admitting: Neurology

## 2019-11-30 ENCOUNTER — Telehealth (INDEPENDENT_AMBULATORY_CARE_PROVIDER_SITE_OTHER): Payer: PPO | Admitting: Neurology

## 2019-11-30 ENCOUNTER — Other Ambulatory Visit: Payer: Self-pay

## 2019-11-30 ENCOUNTER — Encounter: Payer: Self-pay | Admitting: Neurology

## 2019-11-30 VITALS — Ht 65.0 in | Wt 140.0 lb

## 2019-11-30 DIAGNOSIS — G950 Syringomyelia and syringobulbia: Secondary | ICD-10-CM | POA: Diagnosis not present

## 2019-11-30 DIAGNOSIS — G43709 Chronic migraine without aura, not intractable, without status migrainosus: Secondary | ICD-10-CM

## 2019-12-02 ENCOUNTER — Other Ambulatory Visit: Payer: Self-pay

## 2019-12-02 ENCOUNTER — Ambulatory Visit (INDEPENDENT_AMBULATORY_CARE_PROVIDER_SITE_OTHER): Payer: PPO | Admitting: Internal Medicine

## 2019-12-02 ENCOUNTER — Encounter: Payer: Self-pay | Admitting: Internal Medicine

## 2019-12-02 ENCOUNTER — Ambulatory Visit (HOSPITAL_COMMUNITY)
Admission: RE | Admit: 2019-12-02 | Discharge: 2019-12-02 | Disposition: A | Discharge status: Home / Self Care | Payer: PPO | Source: Ambulatory Visit | Attending: Internal Medicine | Admitting: Internal Medicine

## 2019-12-02 VITALS — BP 138/80 | HR 72 | Temp 98.4°F | Ht 65.0 in | Wt 163.0 lb

## 2019-12-02 DIAGNOSIS — F429 Obsessive-compulsive disorder, unspecified: Secondary | ICD-10-CM

## 2019-12-02 DIAGNOSIS — R739 Hyperglycemia, unspecified: Secondary | ICD-10-CM

## 2019-12-02 DIAGNOSIS — M7989 Other specified soft tissue disorders: Secondary | ICD-10-CM

## 2019-12-02 DIAGNOSIS — M25562 Pain in left knee: Secondary | ICD-10-CM | POA: Diagnosis not present

## 2019-12-02 DIAGNOSIS — M25561 Pain in right knee: Secondary | ICD-10-CM | POA: Diagnosis not present

## 2019-12-02 NOTE — Progress Notes (Signed)
Lower extremity venous has been completed.   Preliminary results in CV Proc.   Abram Sander 12/02/2019 3:16 PM

## 2019-12-02 NOTE — Patient Instructions (Signed)
You will be contacted regarding the referral for: right leg venous doppler (to check for blood clot and possible right knee bakers cyst)  Please continue all other medications as before, and refills have been done if requested.  Please have the pharmacy call with any other refills you may need.  Please keep your appointments with your specialists as you may have planned
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
## 2019-12-02 NOTE — Assessment & Plan Note (Signed)
Has bilateral venous insufficiency, difficult to say if worse

## 2019-12-04 ENCOUNTER — Encounter: Payer: Self-pay | Admitting: Internal Medicine

## 2019-12-04 NOTE — Assessment & Plan Note (Signed)
stable overall by history and exam, recent data reviewed with pt, and pt to continue medical treatment as before,  to f/u any worsening symptoms or concerns  

## 2019-12-04 NOTE — Assessment & Plan Note (Addendum)
Etiology unclear, cant r/o bakers cyst, for RLE venous doppler r/o dvt and baker cyst, if neg pt should see sport medicine  I spent 31 minutes preparing to see the patient by review of recent labs, imaging and procedures, obtaining and reviewing separately obtained history, communicating with the patient and family or caregiver, ordering medications, tests or procedures, and documenting clinical information in the EHR including the differential Dx, treatment, and any further evaluation and other management of right knee pain leg swelling, hyperglycemia, OCD

## 2019-12-09 ENCOUNTER — Encounter: Payer: Self-pay | Admitting: Neurology

## 2019-12-09 ENCOUNTER — Ambulatory Visit (INDEPENDENT_AMBULATORY_CARE_PROVIDER_SITE_OTHER): Payer: PPO | Admitting: Neurology

## 2019-12-09 ENCOUNTER — Other Ambulatory Visit: Payer: Self-pay

## 2019-12-09 DIAGNOSIS — G43709 Chronic migraine without aura, not intractable, without status migrainosus: Secondary | ICD-10-CM

## 2019-12-09 MED ORDER — ONABOTULINUMTOXINA 100 UNITS IJ SOLR
155.0000 [IU] | Freq: Once | INTRAMUSCULAR | Status: AC
  Administered 2019-12-09: 155 [IU] via INTRAMUSCULAR

## 2019-12-12 NOTE — Progress Notes (Signed)
[facility]   Procedure Note Botox  Attending: Dr. Brianny Soulliere  Preoperative Diagnosis(es): Chronic migraine  Consent obtained from: The patient Benefits discussed included, but were not limited to decreased muscle tightness, increased joint range of motion, and decreased pain.  Risk discussed included, but were not limited pain and discomfort, bleeding, bruising, excessive weakness, venous thrombosis, muscle atrophy and dysphagia.  Anticipated outcomes of the procedure as well as he risks and benefits of the alternatives to the procedure, and the roles and tasks of the personnel to be involved, were discussed with the patient, and the patient consents to the procedure and agrees to proceed. A copy of the patient medication guide was given to the patient which explains the blackbox warning.  Patients identity and treatment sites confirmed Yes.  .  Details of Procedure: Skin was cleaned with alcohol. Prior to injection, the needle plunger was aspirated to make sure the needle was not within a blood vessel.  There was no blood retrieved on aspiration.    Following is a summary of the muscles injected  And the amount of Botulinum toxin used:  Dilution 200 units of Botox was reconstituted with 4 ml of preservative free normal saline. Time of reconstitution: At the time of the office visit (<30 minutes prior to injection)   Injections  155 total units of Botox was injected with a 30 gauge needle.  Injection Sites: L occipitalis: 15 units- 3 sites  R occiptalis: 15 units- 3 sites  L upper trapezius: 15 units- 3 sites R upper trapezius: 15 units- 3 sits          L paraspinal: 10 units- 2 sites R paraspinal: 10 units- 2 sites  Face L frontalis(2 injection sites):10 units   R frontalis(2 injection sites):10 units         L corrugator: 5 units   R corrugator: 5 units           Procerus: 5 units   L temporalis: 20 units R temporalis: 20 units   Agent:  200 units of botulinum Type  A (Onobotulinum Toxin type A) was reconstituted with 4 ml of preservative free normal saline.  Time of reconstitution: At the time of the office visit (<30 minutes prior to injection)     Total injected (Units): 155  Total wasted (Units): none wasted  Patient tolerated procedure well without complications.   Reinjection is anticipated in 3 months.    

## 2019-12-14 ENCOUNTER — Encounter: Payer: Self-pay | Admitting: *Deleted

## 2019-12-14 NOTE — Progress Notes (Signed)
BOTOX(onabotulinumtoxinA) Benefits Verification Results  Phone: [phone]     PATIENT INFORMATION PROVIDER INFORMATION Name:   Beth Huffman Prescriber:  Metta Clines DOB:  Jun 18, 1961 HCP-Reported Network Status:    In Network SR #:  [id number] Setting:     [place] #:    [id number] Referral Required:    No Date Verified:    12/09/2019 Plan Name:    [employer] PPO Referring Physician:      Payer Phone:    [phone] Group Name:    [employer] Referring Physician Phone #:      Payer Contact:    Lewis Shock Plan Type:    Managed Care - PPO Referral Dates:      Call Reference#:    [id number] Policy Start Date:    0/03/3867 Referral #:       Policy End Date:      # Visits Approved:       Major Medical Benefits Individual Deductible:     $0.00 ($0.00  met) Individual Out of Pocket Maximum:     $3,400.00 ($0.00  met) PATIENT COVERAGE - MAJOR MEDICAL BENEFITS ICD Code(s) : G43.709    # of BOTOX Units: 155 BV Result  PA/Pre-D Required  No Copay Coinsurance 54883 Covered - Per Insurer Guidelines   $0.00 20% BOTOX (G1415) Buy and Bill Covered - Per Insurer Guidelines  No $0.00 20% BOTOX (F7331) Specialty Pharmacy Covered - Per Insurer Guidelines  No $0.00 20%

## 2020-01-03 ENCOUNTER — Other Ambulatory Visit: Payer: Self-pay | Admitting: Neurology

## 2020-01-06 ENCOUNTER — Ambulatory Visit: Payer: PPO | Admitting: Neurology

## 2020-01-16 ENCOUNTER — Other Ambulatory Visit: Payer: Self-pay | Admitting: Obstetrics & Gynecology

## 2020-01-16 DIAGNOSIS — Z1231 Encounter for screening mammogram for malignant neoplasm of breast: Secondary | ICD-10-CM

## 2020-01-31 ENCOUNTER — Ambulatory Visit: Payer: PPO | Admitting: Internal Medicine

## 2020-02-02 ENCOUNTER — Encounter: Payer: Self-pay | Admitting: Internal Medicine

## 2020-02-02 ENCOUNTER — Ambulatory Visit (INDEPENDENT_AMBULATORY_CARE_PROVIDER_SITE_OTHER): Payer: PPO | Admitting: Internal Medicine

## 2020-02-02 ENCOUNTER — Other Ambulatory Visit: Payer: Self-pay

## 2020-02-02 DIAGNOSIS — M5441 Lumbago with sciatica, right side: Secondary | ICD-10-CM

## 2020-02-02 DIAGNOSIS — G43719 Chronic migraine without aura, intractable, without status migrainosus: Secondary | ICD-10-CM | POA: Diagnosis not present

## 2020-02-02 DIAGNOSIS — K219 Gastro-esophageal reflux disease without esophagitis: Secondary | ICD-10-CM | POA: Diagnosis not present

## 2020-02-02 DIAGNOSIS — G8929 Other chronic pain: Secondary | ICD-10-CM

## 2020-02-02 DIAGNOSIS — M5442 Lumbago with sciatica, left side: Secondary | ICD-10-CM | POA: Diagnosis not present

## 2020-02-02 MED ORDER — RIZATRIPTAN BENZOATE 10 MG PO TABS: 10.0000 mg | ORAL_TABLET | Freq: Once | ORAL | 3 refills | Status: DC | PRN

## 2020-02-02 MED ORDER — KETOROLAC TROMETHAMINE 60 MG/2ML IM SOLN
60.0000 mg | Freq: Once | INTRAMUSCULAR | Status: AC
  Administered 2020-02-02: 60 mg via INTRAMUSCULAR

## 2020-02-02 MED ORDER — OXYCODONE HCL 10 MG PO TABS: 10.0000 mg | ORAL_TABLET | Freq: Four times a day (QID) | ORAL | 0 refills | Status: DC | PRN

## 2020-02-02 MED ORDER — UBRELVY 100 MG PO TABS: 100.0000 mg | ORAL_TABLET | Freq: Every day | ORAL | 11 refills | Status: DC | PRN

## 2020-02-02 MED ORDER — NAPROXEN 500 MG PO TABS: 500.0000 mg | ORAL_TABLET | Freq: Two times a day (BID) | ORAL | 3 refills | Status: DC

## 2020-02-02 NOTE — Assessment & Plan Note (Signed)
Protonix po 

## 2020-02-02 NOTE — Assessment & Plan Note (Signed)
Severe HA now - ?post -COVID vaccine Ubrelvi Toradol  60 mg IM

## 2020-02-02 NOTE — Assessment & Plan Note (Signed)
On Oxycodone  Potential benefits of a long term opioids use as well as potential risks (i.e. addiction risk, apnea etc) and complications (i.e. Somnolence, constipation and others) were explained to the patient and were aknowledged.  

## 2020-02-02 NOTE — Progress Notes (Signed)
Subjective:  Patient ID: Beth Huffman, female    DOB: 05/27/1961  Age: 59 y.o. MRN: [id number]  CC: No chief complaint on file.   HPI Beth Huffman presents for chronic pain, HAs, GERD f/u C/o severe HA x 3 d Pt had Moderna shot last wk   Outpatient Medications Prior to Visit  Medication Sig Dispense Refill  . cholecalciferol (VITAMIN D) 1000 UNITS tablet Take 1,000 Units by mouth daily.      . cyclobenzaprine (FLEXERIL) 5 MG tablet TAKE 1 TABLET THREE TIMES DAILY AS NEEDED FOR MUSCLE SPASMS. 90 tablet 1  . estradiol (VIVELLE-DOT) 0.05 MG/24HR patch Place 1 patch (0.05 mg total) onto the skin 2 (two) times a week. 24 patch 4  . ibuprofen (ADVIL) 600 MG tablet TAKE 1 TABLET EVERY 8 HOURS AS NEEDED FOR HEADACHE. 90 tablet 0  . Multiple Vitamin (MULTIVITAMIN PO) Take 1 tablet by mouth daily.      . ondansetron (ZOFRAN) 8 MG tablet Take 1 tablet (8 mg total) by mouth every 8 (eight) hours as needed for nausea or vomiting. 21 tablet 3  . pantoprazole (PROTONIX) 40 MG tablet Take 1 tablet (40 mg total) by mouth daily. 90 tablet 3  . polyethylene glycol (MIRALAX / GLYCOLAX) packet Take 8.5 g by mouth every other day.    . SUMAtriptan (IMITREX) 100 MG tablet TAKE 1 TABLET AT ONSET OF MIGRAINE- MAY REPEAT ONCE IN 2 HOURS. LIMIT2/24 HOURS. AVOID DAILY USE. 9 tablet 5  . verapamil (CALAN-SR) 120 MG CR tablet Take 1 tablet (120 mg total) by mouth at bedtime. 90 tablet 3  . naproxen (NAPROSYN) 500 MG tablet TAKE (1) TABLET TWICE A DAY AS NEEDED. 20 tablet 0  . Oxycodone HCl 10 MG TABS Take 1 tablet (10 mg total) by mouth 4 (four) times daily as needed. 120 tablet 0  . Oxycodone HCl 10 MG TABS Take 1 tablet (10 mg total) by mouth every 6 (six) hours as needed. 120 tablet 0  . Oxycodone HCl 10 MG TABS Take 1 tablet (10 mg total) by mouth every 6 (six) hours as needed. 120 tablet 0  . rizatriptan (MAXALT) 10 MG tablet Take 1 tablet (10 mg total) by mouth once as needed for up to 1 dose for  migraine. May repeat in 2 hours if needed 24 tablet 3   No facility-administered medications prior to visit.    ROS: Review of Systems  Constitutional: Positive for fatigue. Negative for activity change, appetite change, chills and unexpected weight change.  HENT: Negative for congestion, mouth sores and sinus pressure.   Eyes: Negative for visual disturbance.  Respiratory: Negative for cough and chest tightness.   Gastrointestinal: Negative for abdominal pain and nausea.  Genitourinary: Negative for difficulty urinating, frequency and vaginal pain.  Musculoskeletal: Positive for back pain. Negative for gait problem.  Skin: Negative for pallor and rash.  Neurological: Positive for numbness and headaches. Negative for dizziness, tremors and weakness.  Psychiatric/Behavioral: Negative for confusion and sleep disturbance.    Objective:  BP 134/78 (BP Location: Left Arm, Patient Position: Sitting, Cuff Size: Normal)   Pulse 66   Temp 98.7 F (37.1 C) (Oral)   Ht 5\' 5"  (1.651 m)   Wt 168 lb (76.2 kg)   SpO2 97%   BMI 27.96 kg/m   BP Readings from Last 3 Encounters:  02/02/20 134/78  12/02/19 138/80  10/31/19 134/82    Wt Readings from Last 3 Encounters:  02/02/20 168 lb (76.2 kg)  12/02/19 163 lb (73.9 kg)  11/29/19 140 lb (63.5 kg)    Physical Exam Constitutional:      General: She is not in acute distress.    Appearance: She is well-developed.  HENT:     Head: Normocephalic.     Right Ear: External ear normal.     Left Ear: External ear normal.     Nose: Nose normal.  Eyes:     General:        Right eye: No discharge.        Left eye: No discharge.     Conjunctiva/sclera: Conjunctivae normal.     Pupils: Pupils are equal, round, and reactive to light.  Neck:     Thyroid: No thyromegaly.     Vascular: No JVD.     Trachea: No tracheal deviation.  Cardiovascular:     Rate and Rhythm: Normal rate and regular rhythm.     Heart sounds: Normal heart sounds.    Pulmonary:     Effort: No respiratory distress.     Breath sounds: No stridor. No wheezing.  Abdominal:     General: Bowel sounds are normal. There is no distension.     Palpations: Abdomen is soft. There is no mass.     Tenderness: There is no abdominal tenderness. There is no guarding or rebound.  Musculoskeletal:        General: No tenderness.     Cervical back: Normal range of motion and neck supple.  Lymphadenopathy:     Cervical: No cervical adenopathy.  Skin:    Findings: No erythema or rash.  Neurological:     Cranial Nerves: No cranial nerve deficit.     Motor: No abnormal muscle tone.     Coordination: Coordination normal.     Deep Tendon Reflexes: Reflexes normal.  Psychiatric:        Behavior: Behavior normal.        Thought Content: Thought content normal.        Judgment: Judgment normal.     Lab Results  Component Value Date   WBC 4.3 10/31/2019   HGB 13.0 10/31/2019   HCT 39.3 10/31/2019   PLT 195.0 10/31/2019   GLUCOSE 86 10/31/2019   CHOL 199 10/31/2019   TRIG 72.0 10/31/2019   HDL 64.60 10/31/2019   LDLDIRECT 130.2 12/21/2013   LDLCALC 120 (H) 10/31/2019   ALT 29 10/31/2019   AST 34 10/31/2019   NA 139 10/31/2019   K 4.0 10/31/2019   CL 104 10/31/2019   CREATININE 0.81 10/31/2019   BUN 14 10/31/2019   CO2 28 10/31/2019   TSH 1.40 10/31/2019   HGBA1C 5.5 03/31/2018    VAS [place] LOWER EXTREMITY VENOUS (DVT)  Result Date: 12/04/2019  Lower Venous DVTStudy Indications: Swelling, and Pain.  Comparison Study: no prior Performing Technologist: Abram Sander RVS  Examination Guidelines: A complete evaluation includes B-mode imaging, spectral Doppler, color Doppler, and power Doppler as needed of all accessible portions of each vessel. Bilateral testing is considered an integral part of a complete examination. Limited examinations for reoccurring indications may be performed as noted. The reflux portion of the exam is performed with the patient in reverse  Trendelenburg.  +---------+---------------+---------+-----------+----------+--------------+ RIGHT    CompressibilityPhasicitySpontaneityPropertiesThrombus Aging +---------+---------------+---------+-----------+----------+--------------+ CFV      Full           Yes      Yes                                 +---------+---------------+---------+-----------+----------+--------------+  SFJ      Full                                                        +---------+---------------+---------+-----------+----------+--------------+ FV Prox  Full                                                        +---------+---------------+---------+-----------+----------+--------------+ FV Mid   Full                                                        +---------+---------------+---------+-----------+----------+--------------+ FV DistalFull                                                        +---------+---------------+---------+-----------+----------+--------------+ PFV      Full                                                        +---------+---------------+---------+-----------+----------+--------------+ POP      Full           Yes      Yes                                 +---------+---------------+---------+-----------+----------+--------------+ PTV      Full                                                        +---------+---------------+---------+-----------+----------+--------------+ PERO                                                  Not visualized +---------+---------------+---------+-----------+----------+--------------+   +----+---------------+---------+-----------+----------+--------------+ LEFTCompressibilityPhasicitySpontaneityPropertiesThrombus Aging +----+---------------+---------+-----------+----------+--------------+ CFV Full           Yes      Yes                                  +----+---------------+---------+-----------+----------+--------------+     Summary: RIGHT: - There is no evidence of deep vein thrombosis in the lower extremity.  - No cystic structure found in the popliteal fossa.  LEFT: - No evidence of common femoral vein obstruction.  *See table(s) above for measurements and observations. Electronically signed by Ruta Hinds MD on 12/04/2019 at 11:36:05 AM.    Final  Assessment & Plan:   There are no diagnoses linked to this encounter.   Meds ordered this encounter  Medications  . rizatriptan (MAXALT) 10 MG tablet    Sig: Take 1 tablet (10 mg total) by mouth once as needed for up to 1 dose for migraine. May repeat in 2 hours if needed    Dispense:  24 tablet    Refill:  3  . Oxycodone HCl 10 MG TABS    Sig: Take 1 tablet (10 mg total) by mouth 4 (four) times daily as needed.    Dispense:  120 tablet    Refill:  0    Please fill on or after 04/03/20  . Oxycodone HCl 10 MG TABS    Sig: Take 1 tablet (10 mg total) by mouth every 6 (six) hours as needed.    Dispense:  120 tablet    Refill:  0    Please fill on or after 03/04/20  . Oxycodone HCl 10 MG TABS    Sig: Take 1 tablet (10 mg total) by mouth every 6 (six) hours as needed.    Dispense:  120 tablet    Refill:  0    Please fill on or after 02/03/20  . naproxen (NAPROSYN) 500 MG tablet    Sig: Take 1 tablet (500 mg total) by mouth 2 (two) times daily with a meal.    Dispense:  60 tablet    Refill:  3  . Ubrogepant (UBRELVY) 100 MG TABS    Sig: Take 100 mg by mouth daily as needed.    Dispense:  6 tablet    Refill:  11     Follow-up: No follow-ups on file.  Walker Kehr, MD

## 2020-02-10 ENCOUNTER — Ambulatory Visit (INDEPENDENT_AMBULATORY_CARE_PROVIDER_SITE_OTHER): Payer: PPO | Admitting: Obstetrics & Gynecology

## 2020-02-10 ENCOUNTER — Encounter: Payer: Self-pay | Admitting: Obstetrics & Gynecology

## 2020-02-10 ENCOUNTER — Other Ambulatory Visit: Payer: Self-pay

## 2020-02-10 VITALS — BP 126/84 | Ht 63.5 in | Wt 168.6 lb

## 2020-02-10 DIAGNOSIS — Z90710 Acquired absence of both cervix and uterus: Secondary | ICD-10-CM

## 2020-02-10 DIAGNOSIS — Z01419 Encounter for gynecological examination (general) (routine) without abnormal findings: Secondary | ICD-10-CM

## 2020-02-10 DIAGNOSIS — Z1272 Encounter for screening for malignant neoplasm of vagina: Secondary | ICD-10-CM | POA: Diagnosis not present

## 2020-02-10 DIAGNOSIS — Z9189 Other specified personal risk factors, not elsewhere classified: Secondary | ICD-10-CM

## 2020-02-10 DIAGNOSIS — Z79890 Hormone replacement therapy (postmenopausal): Secondary | ICD-10-CM

## 2020-02-10 DIAGNOSIS — N3946 Mixed incontinence: Secondary | ICD-10-CM | POA: Diagnosis not present

## 2020-02-10 DIAGNOSIS — E663 Overweight: Secondary | ICD-10-CM

## 2020-02-10 MED ORDER — ESTRADIOL 0.05 MG/24HR TD PTTW: 1.0000 | MEDICATED_PATCH | TRANSDERMAL | 4 refills | Status: DC

## 2020-02-10 NOTE — Patient Instructions (Signed)
1. Encounter for Papanicolaou smear of vagina as part of routine gynecological examination Gynecologic exam status post total hysterectomy and sacrocolpopexy with BSO.  Pap reflex done on the vaginal vault.  Breast exam normal.  Screening mammogram now.  Cologuard last year.  Health labs with family physician.  2. Post-menopause on HRT (hormone replacement therapy) Well on Vivelle dot 0.05 twice a week x about 6 years.  No contraindication to continue on hormone replacement therapy.  Prescription sent to pharmacy.  Vitamin D supplements, calcium intake of 1200 mg daily and regular weightbearing physical activities to continue.  3. S/P total hysterectomy/Sacrocolpopexy, BSO, Cystocele repair and Sling procedure Good suspension of the apex of the vagina.  Cystocele grade 2/4 and rectocele grade 1/3.  4. Mixed stress and urge urinary incontinence Mixed stress and urge incontinence progressing since last year.  Decision to refer patient back to Dr. Matilde Sprang for further investigation and management.  5. Overweight (BMI 25.0-29.9) Lower calorie/carb diet and aerobic physical activities 5 times a week with light weightlifting every 2 days recommended.  [name], it was a pleasure seeing you today!  I will inform you of your results as soon as they are available.

## 2020-02-10 NOTE — Progress Notes (Signed)
Beth Huffman 1961-01-12 [id number]   History:    59 y.o. G2P2L2 Married.  Daughter 28 yo, son 2 yo.  2 grand-children.  RP:  Established patient presenting for annual gyn exam   HPI: Menopause, well on Estradiol Patch 0.05 currently x about 6 yrs. No Menopausal Sx. S/P Vaginal Hyst in 90's and Sacrocolpopexy/BSO/Ant Repair/Perineoplasty/TVT 06/2016.  Feels urgency frequently and has progressively more urine leakage with minimal pressure.  Urine leaking mainly on a full bladder while heading to the restroom. Rectal irritation with constipation.  No pelvic pain. Breasts wnl. BMI mild increase to 29.4.  Walking the dog 3x a week.  Health labs with Fam MD.  Cologard last year.  Past medical history,surgical history, family history and social history were all reviewed and documented in the EPIC chart.  Gynecologic History No LMP recorded. Patient is postmenopausal.  Obstetric History OB History  Gravida Para Term Preterm AB Living  2 2       2   SAB TAB Ectopic Multiple Live Births               # Outcome Date GA Lbr Len/2nd Weight Sex Delivery Anes PTL Lv  2 Para           1 Para              ROS: A ROS was performed and pertinent positives and negatives are included in the history.  GENERAL: No fevers or chills. HEENT: No change in vision, no earache, sore throat or sinus congestion. NECK: No pain or stiffness. CARDIOVASCULAR: No chest pain or pressure. No palpitations. PULMONARY: No shortness of breath, cough or wheeze. GASTROINTESTINAL: No abdominal pain, nausea, vomiting or diarrhea, melena or bright red blood per rectum. GENITOURINARY: No urinary frequency, urgency, hesitancy or dysuria. MUSCULOSKELETAL: No joint or muscle pain, no back pain, no recent trauma. DERMATOLOGIC: No rash, no itching, no lesions. ENDOCRINE: No polyuria, polydipsia, no heat or cold intolerance. No recent change in weight. HEMATOLOGICAL: No anemia or easy bruising or bleeding. NEUROLOGIC: No  headache, seizures, numbness, tingling or weakness. PSYCHIATRIC: No depression, no loss of interest in normal activity or change in sleep pattern.     Exam:   BP 126/84   Ht 5' 3.5" (1.613 m)   Wt 168 lb 9.6 oz (76.5 kg)   BMI 29.40 kg/m   Body mass index is 29.4 kg/m.  General appearance : Well developed well nourished female. No acute distress HEENT: Eyes: no retinal hemorrhage or exudates,  Neck supple, trachea midline, no carotid bruits, no thyroidmegaly Lungs: Clear to auscultation, no rhonchi or wheezes, or rib retractions  Heart: Regular rate and rhythm, no murmurs or gallops Breast:Examined in sitting and supine position were symmetrical in appearance, no palpable masses or tenderness,  no skin retraction, no nipple inversion, no nipple discharge, no skin discoloration, no axillary or supraclavicular lymphadenopathy Abdomen: no palpable masses or tenderness, no rebound or guarding Extremities: no edema or skin discoloration or tenderness  Pelvic: Vulva: Normal             Vagina: No gross lesions or discharge.  Pap reflex done.  Cystocele grade 2/4.  Rectocele grade 1/3.  Apex holding with good suspension.  Cervix/Uterus absent  Adnexa  Without masses or tenderness  Anus: No fissure/lesion.  Rectal exam Normal.   Assessment/Plan:  59 y.o. female for annual exam   1. Encounter for Papanicolaou smear of vagina as part of routine gynecological examination Gynecologic exam status post  total hysterectomy and sacrocolpopexy with BSO.  Pap reflex done on the vaginal vault.  Breast exam normal.  Screening mammogram now.  Cologuard last year.  Health labs with family physician.  2. Post-menopause on HRT (hormone replacement therapy) Well on Vivelle dot 0.05 twice a week x about 6 years.  No contraindication to continue on hormone replacement therapy.  Prescription sent to pharmacy.  Vitamin D supplements, calcium intake of 1200 mg daily and regular weightbearing physical activities  to continue.  3. S/P total hysterectomy/Sacrocolpopexy, BSO, Cystocele repair and Sling procedure Good suspension of the apex of the vagina.  Cystocele grade 2/4 and rectocele grade 1/3.  4. Mixed stress and urge urinary incontinence Mixed stress and urge incontinence progressing since last year.  Decision to refer patient back to Dr. Matilde Sprang for further investigation and management.  5. Overweight (BMI 25.0-29.9) Lower calorie/carb diet and aerobic physical activities 5 times a week with light weightlifting every 2 days recommended.  Princess Bruins MD, 9:49 AM 02/10/2020

## 2020-02-13 ENCOUNTER — Telehealth: Payer: Self-pay | Admitting: *Deleted

## 2020-02-13 NOTE — Telephone Encounter (Signed)
Office notes faxed to [facility] and place in [place] they will call to schedule.

## 2020-02-13 NOTE — Telephone Encounter (Signed)
-----   Message from Princess Bruins, MD sent at 02/10/2020 10:28 AM EDT ----- Regarding: Refer back to Dr Matilde Sprang Recurrence of Urinary Urgency/SUI post TVT in 2017.  S/P Total Hyst/BSO, Sacrocolpopexy.  Cystocele grade 2/4 and Rectocele grade 1/3.

## 2020-02-14 ENCOUNTER — Telehealth: Payer: Self-pay | Admitting: *Deleted

## 2020-02-14 LAB — PAP IG W/ RFLX HPV ASCU

## 2020-02-14 NOTE — Telephone Encounter (Signed)
PA form filled out and faxed back to insurance for generic estradiol patch twice weekly patch. Will wait for response.

## 2020-02-20 ENCOUNTER — Telehealth: Payer: Self-pay

## 2020-02-20 NOTE — Telephone Encounter (Signed)
Agree with Estradiol 0.5 mg/tab 1 tab PO daily #90, refill x 4.

## 2020-02-20 NOTE — Telephone Encounter (Signed)
PA approved for Dotti 0.05 mg patch until 10/26/20.

## 2020-02-20 NOTE — Telephone Encounter (Signed)
Note from the pharmacy  "High copayment for the patch. Insurance and patient prefer change to Estradiol tablets. Please send Rx for tablets."

## 2020-02-21 MED ORDER — ESTRADIOL 0.5 MG PO TABS: 0.5000 mg | ORAL_TABLET | Freq: Every day | ORAL | 4 refills | Status: DC

## 2020-02-21 NOTE — Telephone Encounter (Signed)
New Rx sent.

## 2020-02-28 NOTE — Telephone Encounter (Addendum)
[facility] has called x 2 and left message for patient to call. I called patient and asked to call urology to schedule.

## 2020-03-06 NOTE — Telephone Encounter (Signed)
Enounter closed patient was given # to call and schedule .

## 2020-03-09 ENCOUNTER — Ambulatory Visit (INDEPENDENT_AMBULATORY_CARE_PROVIDER_SITE_OTHER): Payer: PPO | Admitting: Neurology

## 2020-03-09 ENCOUNTER — Other Ambulatory Visit: Payer: Self-pay

## 2020-03-09 DIAGNOSIS — G43709 Chronic migraine without aura, not intractable, without status migrainosus: Secondary | ICD-10-CM

## 2020-03-09 MED ORDER — ONABOTULINUMTOXINA 100 UNITS IJ SOLR
200.0000 [IU] | Freq: Once | INTRAMUSCULAR | Status: AC
  Administered 2020-03-09: 155 [IU] via INTRAMUSCULAR

## 2020-03-09 NOTE — Progress Notes (Signed)
[facility]   Procedure Note Botox  Attending: Dr. Shakiyah Cirilo  Preoperative Diagnosis(es): Chronic migraine  Consent obtained from: The patient Benefits discussed included, but were not limited to decreased muscle tightness, increased joint range of motion, and decreased pain.  Risk discussed included, but were not limited pain and discomfort, bleeding, bruising, excessive weakness, venous thrombosis, muscle atrophy and dysphagia.  Anticipated outcomes of the procedure as well as he risks and benefits of the alternatives to the procedure, and the roles and tasks of the personnel to be involved, were discussed with the patient, and the patient consents to the procedure and agrees to proceed. A copy of the patient medication guide was given to the patient which explains the blackbox warning.  Patients identity and treatment sites confirmed Yes.  .  Details of Procedure: Skin was cleaned with alcohol. Prior to injection, the needle plunger was aspirated to make sure the needle was not within a blood vessel.  There was no blood retrieved on aspiration.    Following is a summary of the muscles injected  And the amount of Botulinum toxin used:  Dilution 200 units of Botox was reconstituted with 4 ml of preservative free normal saline. Time of reconstitution: At the time of the office visit (<30 minutes prior to injection)   Injections  155 total units of Botox was injected with a 30 gauge needle.  Injection Sites: L occipitalis: 15 units- 3 sites  R occiptalis: 15 units- 3 sites  L upper trapezius: 15 units- 3 sites R upper trapezius: 15 units- 3 sits          L paraspinal: 10 units- 2 sites R paraspinal: 10 units- 2 sites  Face L frontalis(2 injection sites):10 units   R frontalis(2 injection sites):10 units         L corrugator: 5 units   R corrugator: 5 units           Procerus: 5 units   L temporalis: 20 units R temporalis: 20 units   Agent:  200 units of botulinum Type  A (Onobotulinum Toxin type A) was reconstituted with 4 ml of preservative free normal saline.  Time of reconstitution: At the time of the office visit (<30 minutes prior to injection)     Total injected (Units): 155  Total wasted (Units): 8  Patient tolerated procedure well without complications.   Reinjection is anticipated in 3 months.    

## 2020-03-12 ENCOUNTER — Other Ambulatory Visit: Payer: Self-pay | Admitting: Internal Medicine

## 2020-03-12 ENCOUNTER — Ambulatory Visit: Payer: PPO

## 2020-04-23 ENCOUNTER — Other Ambulatory Visit: Payer: Self-pay

## 2020-04-23 ENCOUNTER — Other Ambulatory Visit: Payer: Self-pay | Admitting: Internal Medicine

## 2020-04-23 ENCOUNTER — Ambulatory Visit
Admission: RE | Admit: 2020-04-23 | Discharge: 2020-04-23 | Disposition: A | Discharge status: Home / Self Care | Payer: PPO | Source: Ambulatory Visit | Attending: Obstetrics & Gynecology | Admitting: Obstetrics & Gynecology

## 2020-04-23 DIAGNOSIS — Z1231 Encounter for screening mammogram for malignant neoplasm of breast: Secondary | ICD-10-CM | POA: Diagnosis not present

## 2020-04-23 NOTE — Telephone Encounter (Signed)
   1.Medication Requested: Oxycodone HCl 10 MG TABS ibuprofen (ADVIL) 600 MG tablet rizatriptan (MAXALT) 10 MG tablet   2. Pharmacy ([place], [place], [place]):[facility] [place], [place]  3. On Med List: yes  4. Last Visit with PCP: 02/02/20  5. Next visit date with PCP: 05/07/20    Agent: Please be advised that RX refills may take up to 3 business days. We ask that you follow-up with your pharmacy.

## 2020-04-23 NOTE — Progress Notes (Signed)
NEUROLOGY FOLLOW UP OFFICE NOTE  Beth Huffman [id number]  HISTORY OF PRESENT ILLNESS: Beth Huffman is a 59year old right-handed Caucasian woman who follows up for migraines.  UPDATE: First round of Botox in February, status post two rounds.  Headaches less severe and frequent but migraines start creeping up about 2 to 3 weeks prior to next injection.    Intensity:severe Duration:1 hour Frequency:6 days in past 30 days Current NSAIDS:ibuprofen, naproxen (take with triptan) Current analgesics:oxycodone Current triptans:Maxalt 10mg  OR sumatriptan 100mg  Current ergotamine:none Current anti-emetic:Zofran 8mg  Current muscle relaxants:Flexeril 5mg  Current anti-anxiolytic:none Current sleep aide:none Current Antihypertensive medications:Verapamil CR 128mg  Current Antidepressant medications:none Current Anticonvulsant medications:none Current anti-CGRP:none Current Vitamins/Herbal/Supplements:none Current Antihistamines/Decongestants:Flonase Other therapy:Botox status post 2 rounds Hormone/birth control:estradiol  Caffeine:1 1/2 cups coffee daily Diet:Keeps hydrated. Does not skip meals. On Optivia diet. Lost 30 lbs since January Exercise:Walks dogs Depression:no; Anxiety:no Other pain:Spinal pain related to perineural cysts. Recent knee replacement, still with mild soreness Sleep hygiene:good  HISTORY: Onset: Since the early 1990s. She was evaluated at [facility]. 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 MRI of spine from 2005 showed a syrinx from T3 to T12 levels. She subsequently underwent occipital posterior fossa decompression surgery that year. A CT cystogram in 2007 showed residual cyst in the posterior fossa with postoperative changes but no  hydrocephalus or other intracranial mass. Headaches subsided following decompression. They started to return in May 2016. She was previouslyfollowed by Dr. Krista Blue at [facility]. Location:Bifrontal/posterior neck pain Quality:Pressure, throbbing Initial intensity:10/10.shedenies new headache, thunderclap headache or severe headache that wakes herfrom sleep. Aura:no Premonitory Phase:no Postdrome:no Associated symptoms:With or withoutnausea,photophobia, phonophobia.Shedenies associated unilateral numbness or weakness. InitialDuration:1-2 days. Often in the morning when she wakes up InitialFrequency:15 days a month InitialFrequency of abortive medication:sumatriptan or rizatriptan 12-15 days a month Triggers: Prolonged standingor activity/lifting Relieving factors: Just medications. Activity:Can't function  Past NSAIDS:Cambia, naproxen Past analgesics:Excedrin, Tylenol Past abortive triptans:Relpax 40mg , Zomig 5mg  NS, sumatriptan Clover Past abortive ergotamine:none Past muscle relaxants:none Past anti-emetic:none Past antihypertensive medications:atenolol Past antidepressant medications:Nortriptyline (did not feel well on it) Past anticonvulsant medications:Trokendi XR,topiramate 200mg  twice daily;  gabapentin, Lyrica Past anti-CGRP:Emgality (palpitations) Past vitamins/Herbal/Supplements:none Past antihistamines/decongestants:none Other past therapies:Botox(effective)  Family history of headache:No  PAST MEDICAL HISTORY: Past Medical History:  Diagnosis Date  . Abnormal weight gain   . Acute sinusitis 12/05/2008   1/18 8/18  . Arachnoid cyst 08/19/2011  . Chest tightness or pressure 05/07/2018  . Constipation 09/08/2012   Chronic - opioid related 11/17 Amitiza  . Contact dermatitis 03/16/2013   5/14 relapsing - poison ivy   . Dyspnea 05/07/2018  . Elevated liver enzymes 03/04/2011   Chronic off  and on   . Female stress incontinence 02/06/2009   Qualifier: Diagnosis of  By: Plotnikov MD, Evie Lacks   . FEVER UNSPECIFIED 09/29/2007   Qualifier: Diagnosis of  By: Plotnikov MD, Evie Lacks   . GERD 09/29/2007   Chronic  Protonix prn  Potential benefits of a long term PPI use as well as potential risks  and complications were explained to the patient and were aknowledged.    Marland Kitchen GERD (gastroesophageal reflux disease)   . Headache 12/20/2007   Dr Hassell Done Migraines (1 per 2 wks) Topamax 400 mg/d; Imitrex prn; Ibuprofen prn, Zofran prn   . Hyperglycemia 08/19/2011   Mild    . Hypertension   .  Increased blood pressure (not hypertension) 08/23/2014  . Intractable migraine 06/14/2009   Chronic Ibuprofen, Imitrex, Topamax,  Nortriptyline - intolerant  . Knee pain 03/04/2011   R knee 2018 L knee pain - L knee b anserina and L knee is tender w/ROM: L knee pain is worse - patellofemoral syndrome vs other  . LBP (low back pain)   . LOW BACK PAIN 09/29/2007   Chronic, lately (2016) disabling Due to syringomyelia On Oxycodone  Potential benefits of a long term opioids use as well as potential risks (i.e. addiction risk, apnea etc) and complications (i.e. Somnolence, constipation and others) were explained to the patient and were aknowledged.    . LUQ PAIN 05/23/2010   Qualifier: Diagnosis of  By: Plotnikov MD, Evie Lacks   . Migraine, chronic, without aura, intractable 10/28/2017   Botox approved diagnosis. 7/19 Worse. Start Emagility inj. Relpax  . Migraines   . Nausea & vomiting 08/19/2011  . Nausea without vomiting 06/14/2009   Due to migraines    . NECK PAIN 06/14/2009    Chronic, lately (2016) disabling Due to syringomyelia On Oxycodone  Potential benefits of a long term opioids use as well as potential risks (i.e. addiction risk, apnea etc) and complications (i.e. Somnolence, constipation and others) were explained to the patient and were aknowledged.    Marland Kitchen NONSPECIFIC ABNORMAL RESULTS LIVR FUNCTION STUDY  09/28/2009   Qualifier: Diagnosis of  By: Plotnikov MD, [place] 05/23/2010   Chronic - compulsive shopping   . Onychomycosis 01/30/2016   4/17   . Orthostatic hypotension 06/30/2013   9/14 likely Rapaflo induced   . OTITIS EXTERNA 05/10/2008   Qualifier: Diagnosis of  By: Plotnikov MD, Evie Lacks   . Palpitations 06/21/2014  . PARESTHESIA 07/13/2008   Qualifier: Diagnosis of  By: Plotnikov MD, Evie Lacks   . Partial thickness burn of left wrist 09/11/2015  . Postoperative state 07/24/2016  . Pyelonephritis 2008  . Rash 07/27/2017  . Stress 2009  . Syncope, near 06/30/2013   9/14 likely Rapaflo induced 12/14 relapsing off Rapaflo x 7 2/15 no relapsing since on 1/2 dose of Topamax   . Syringobulbia (Strong)   . SYRINGOMYELIA 08/20/2007   Chronic Dr Krista Blue Dr Jalene Mullet at [facility] Chronic pain   . Syringomyelia ([place])   . Tachycardia   . Tonsillitis, chronic 02/26/2017   2018 worse  . URINARY TRACT INFECTION (UTI) 03/23/2008   Qualifier: Diagnosis of  By: Plotnikov MD, Evie Lacks   . UTI (lower urinary tract infection)   . Well adult exam 12/21/2013   We discussed age appropriate health related issues, including available/recomended screening tests and vaccinations. We discussed a need for adhering to healthy diet and exercise. Labs/EKG were reviewed/ordered. All questions were answered.       MEDICATIONS: Current Outpatient Medications on File Prior to Visit  Medication Sig Dispense Refill  . cholecalciferol (VITAMIN D) 1000 UNITS tablet Take 1,000 Units by mouth daily.      . cyclobenzaprine (FLEXERIL) 5 MG tablet TAKE 1 TABLET THREE TIMES DAILY AS NEEDED FOR MUSCLE SPASMS. 90 tablet 0  . estradiol (ESTRACE) 0.5 MG tablet Take 1 tablet (0.5 mg total) by mouth daily. 90 tablet 4  . ibuprofen (ADVIL) 600 MG tablet TAKE 1 TABLET EVERY 8 HOURS AS NEEDED FOR HEADACHE. 90 tablet 0  . Multiple Vitamin (MULTIVITAMIN PO) Take 1 tablet by mouth daily.      . naproxen (NAPROSYN) 500  MG tablet Take 1  tablet (500 mg total) by mouth 2 (two) times daily with a meal. 60 tablet 3  . ondansetron (ZOFRAN) 8 MG tablet Take 1 tablet (8 mg total) by mouth every 8 (eight) hours as needed for nausea or vomiting. 21 tablet 3  . Oxycodone HCl 10 MG TABS Take 1 tablet (10 mg total) by mouth 4 (four) times daily as needed. 120 tablet 0  . Oxycodone HCl 10 MG TABS Take 1 tablet (10 mg total) by mouth every 6 (six) hours as needed. 120 tablet 0  . Oxycodone HCl 10 MG TABS Take 1 tablet (10 mg total) by mouth every 6 (six) hours as needed. 120 tablet 0  . pantoprazole (PROTONIX) 40 MG tablet Take 1 tablet (40 mg total) by mouth daily. 90 tablet 3  . polyethylene glycol (MIRALAX / GLYCOLAX) packet Take 8.5 g by mouth every other day.    . rizatriptan (MAXALT) 10 MG tablet Take 1 tablet (10 mg total) by mouth once as needed for up to 1 dose for migraine. May repeat in 2 hours if needed 24 tablet 3  . SUMAtriptan (IMITREX) 100 MG tablet TAKE 1 TABLET AT ONSET OF MIGRAINE- MAY REPEAT ONCE IN 2 HOURS. LIMIT2/24 HOURS. AVOID DAILY USE. 9 tablet 5  . Ubrogepant (UBRELVY) 100 MG TABS Take 100 mg by mouth daily as needed. 6 tablet 11  . verapamil (CALAN-SR) 120 MG CR tablet Take 1 tablet (120 mg total) by mouth at bedtime. 90 tablet 3   No current facility-administered medications on file prior to visit.    ALLERGIES: Allergies  Allergen Reactions  . Atenolol Other (See Comments)    Wt gain  . Cefuroxime Axetil Nausea Only and Other (See Comments)    REACTION: nausea - but tolerates PCN  . Codeine Sulfate Nausea And Vomiting and Nausea Only  . Fentanyl Nausea And Vomiting and Other (See Comments)    REACTION: bad reaction  . Nitrofurantoin Other (See Comments)    Unknown  . Pregabalin Other (See Comments)    REACTION: cp  . Rapaflo [Silodosin] Other (See Comments)    Orthostatic BP drop, near syncope   . Tramadol Hcl Nausea And Vomiting    REACTION: sick  . Iodinated Diagnostic Agents Rash      FAMILY HISTORY: Family History  Problem Relation Age of Onset  . COPD Mother   . Peripheral vascular disease Mother   . Hypertension Mother   . Heart disease Mother   . Hypertension Father   . Anxiety disorder Other   . Heart attack Maternal Grandmother   . Stroke Maternal Grandfather   . Stroke Paternal Grandfather   . Colon cancer Maternal Uncle 23  . Breast cancer Maternal Aunt     SOCIAL HISTORY: Social History   Socioeconomic History  . Marital status: Married    Spouse name: Not on file  . Number of children: 2  . Years of education: College  . Highest education level: Some college, no degree  Occupational History  . Occupation: Office manager: SELF-EMPLOYED  Tobacco Use  . Smoking status: Never Smoker  . Smokeless tobacco: Never Used  Vaping Use  . Vaping Use: Never used  Substance and Sexual Activity  . Alcohol use: No  . Drug use: No  . Sexual activity: Yes  Other Topics Concern  . Not on file  Social History Narrative   Lives at home with her husband.   Left handed   1 cup caffeine  per day.   Social Determinants of Health   Financial Resource Strain:   . Difficulty of Paying Living Expenses:   Food Insecurity:   . Worried About Charity fundraiser in the Last Year:   . Arboriculturist in the Last Year:   Transportation Needs:   . Film/video editor (Medical):   Marland Kitchen Lack of Transportation (Non-Medical):   Physical Activity:   . Days of Exercise per Week:   . Minutes of Exercise per Session:   Stress:   . Feeling of Stress :   Social Connections:   . Frequency of Communication with Friends and Family:   . Frequency of Social Gatherings with Friends and Family:   . Attends Religious Services:   . Active Member of Clubs or Organizations:   . Attends Archivist Meetings:   Marland Kitchen Marital Status:   Intimate Partner Violence:   . Fear of Current or Ex-Partner:   . Emotionally Abused:   Marland Kitchen Physically Abused:   .  Sexually Abused:     PHYSICAL EXAM: Blood pressure 116/79, pulse 72, height 5\' 5"  (1.651 m), weight 152 lb 12.8 oz (69.3 kg), SpO2 97 %. General: No acute distress.  Patient appears well-groomed.   Head:  Normocephalic/atraumatic Eyes:  Fundi examined but not visualized Neck: supple, no paraspinal tenderness, full range of motion Heart:  Regular rate and rhythm Lungs:  Clear to auscultation bilaterally Back: No paraspinal tenderness Neurological Exam: alert and oriented to person, place, and time. Attention span and concentration intact, recent and remote memory intact, fund of knowledge intact.  Speech fluent and not dysarthric, language intact.  CN II-XII intact. Bulk and tone normal, muscle strength 5-/5 left ankle dorsiflexion 4+/5 left EHL and 5-/5 right EHL.  Otherwise 5/5.  Pinprick and vibration reduced in left foot.  Deep tendon reflexes 2+ throughout, toes downgoing.  Finger to nose testing intact.  Gait normal, Romberg negative.  IMPRESSION: 1.  Migraine without aura, without status migrainosus, not intractable 2.  History of arachnoid cyst and syringomyelia.  Has left foot numbness and weakness, which is chronic, likely radiculopathy or related to syringomyelia.    PLAN: 1.  For preventative management, Botox.  To try and achieve further headache reduction, will add topiramate 25mg  at bedtime 2.  For abortive therapy, sumatriptan or rizatriptan 3.  Limit use of pain relievers to no more than 2 days out of week to prevent risk of rebound or medication-overuse headache. 4.  Keep headache diary 5.  Exercise, hydration, caffeine cessation, sleep hygiene, monitor for and avoid triggers 6.  Follow up for next Botox in August.   Metta Clines, DO  CC: Lew Dawes, MD

## 2020-04-24 ENCOUNTER — Encounter: Payer: Self-pay | Admitting: Neurology

## 2020-04-24 ENCOUNTER — Ambulatory Visit (INDEPENDENT_AMBULATORY_CARE_PROVIDER_SITE_OTHER): Payer: PPO | Admitting: Neurology

## 2020-04-24 VITALS — BP 116/79 | HR 72 | Ht 65.0 in | Wt 152.8 lb

## 2020-04-24 DIAGNOSIS — G43009 Migraine without aura, not intractable, without status migrainosus: Secondary | ICD-10-CM

## 2020-04-24 DIAGNOSIS — G930 Cerebral cysts: Secondary | ICD-10-CM

## 2020-04-24 DIAGNOSIS — G950 Syringomyelia and syringobulbia: Secondary | ICD-10-CM | POA: Diagnosis not present

## 2020-04-24 MED ORDER — RIZATRIPTAN BENZOATE 10 MG PO TABS: 10.0000 mg | ORAL_TABLET | Freq: Once | ORAL | 3 refills | Status: DC | PRN

## 2020-04-24 MED ORDER — TOPIRAMATE 25 MG PO TABS: 25.0000 mg | ORAL_TABLET | Freq: Every day | ORAL | 5 refills | Status: DC

## 2020-04-24 MED ORDER — IBUPROFEN 600 MG PO TABS: ORAL_TABLET | ORAL | 0 refills | Status: DC

## 2020-04-24 NOTE — Telephone Encounter (Signed)
Maxalt and ibuprofen sent. [place] Controlled Database Checked Last filled: 04/03/2020 120 LOV w/you: 02/02/2020 Next appt w/you: 05/07/2020

## 2020-04-24 NOTE — Patient Instructions (Addendum)
1.  Start topiramate 25mg  at bedtime 2.  Continue Botox 3.  Use sumatriptan or rizatriptan as needed.  Limit use of pain relievers to no more than 2 days out of week to prevent risk of rebound or medication-overuse headache. 4. Keep headache diary 5.  Follow up for Botox

## 2020-04-26 MED ORDER — OXYCODONE HCL 10 MG PO TABS: 10.0000 mg | ORAL_TABLET | Freq: Four times a day (QID) | ORAL | 0 refills | Status: DC | PRN

## 2020-04-26 NOTE — Telephone Encounter (Signed)
See 04/26/2020 med refill

## 2020-05-07 ENCOUNTER — Ambulatory Visit (INDEPENDENT_AMBULATORY_CARE_PROVIDER_SITE_OTHER): Payer: PPO | Admitting: Internal Medicine

## 2020-05-07 ENCOUNTER — Encounter: Payer: Self-pay | Admitting: Internal Medicine

## 2020-05-07 ENCOUNTER — Other Ambulatory Visit: Payer: Self-pay

## 2020-05-07 DIAGNOSIS — G8929 Other chronic pain: Secondary | ICD-10-CM

## 2020-05-07 DIAGNOSIS — G43719 Chronic migraine without aura, intractable, without status migrainosus: Secondary | ICD-10-CM

## 2020-05-07 DIAGNOSIS — G950 Syringomyelia and syringobulbia: Secondary | ICD-10-CM | POA: Diagnosis not present

## 2020-05-07 DIAGNOSIS — M5441 Lumbago with sciatica, right side: Secondary | ICD-10-CM

## 2020-05-07 DIAGNOSIS — M5442 Lumbago with sciatica, left side: Secondary | ICD-10-CM

## 2020-05-07 DIAGNOSIS — E785 Hyperlipidemia, unspecified: Secondary | ICD-10-CM | POA: Diagnosis not present

## 2020-05-07 MED ORDER — OXYCODONE HCL 10 MG PO TABS: 10.0000 mg | ORAL_TABLET | Freq: Four times a day (QID) | ORAL | 0 refills | Status: DC | PRN

## 2020-05-07 MED ORDER — UBRELVY 100 MG PO TABS: 100.0000 mg | ORAL_TABLET | Freq: Every day | ORAL | 11 refills | Status: DC | PRN

## 2020-05-07 NOTE — Assessment & Plan Note (Signed)
Chronic pain 

## 2020-05-07 NOTE — Assessment & Plan Note (Signed)
Due to syringomyelia On Oxycodone  Potential benefits of a long term opioids use as well as potential risks (i.e. addiction risk, apnea etc) and complications (i.e. Somnolence, constipation and others) were explained to the patient and were aknowledged. 

## 2020-05-07 NOTE — Progress Notes (Signed)
Subjective:  Patient ID: Beth Huffman, female    DOB: Mar 11, 1961  Age: 59 y.o. MRN: [id number]  CC: No chief complaint on file.   HPI [facility] presents for LBP, menopause, migraines f/u    Outpatient Medications Prior to Visit  Medication Sig Dispense Refill  . cholecalciferol (VITAMIN D) 1000 UNITS tablet Take 1,000 Units by mouth daily.      . cyclobenzaprine (FLEXERIL) 5 MG tablet TAKE 1 TABLET THREE TIMES DAILY AS NEEDED FOR MUSCLE SPASMS. 90 tablet 0  . estradiol (ESTRACE) 0.5 MG tablet Take 1 tablet (0.5 mg total) by mouth daily. 90 tablet 4  . ibuprofen (ADVIL) 600 MG tablet TAKE 1 TABLET EVERY 8 HOURS AS NEEDED FOR HEADACHE. 90 tablet 0  . Multiple Vitamin (MULTIVITAMIN PO) Take 1 tablet by mouth daily.      . ondansetron (ZOFRAN) 8 MG tablet Take 1 tablet (8 mg total) by mouth every 8 (eight) hours as needed for nausea or vomiting. 21 tablet 3  . Oxycodone HCl 10 MG TABS Take 1 tablet (10 mg total) by mouth 4 (four) times daily as needed. 120 tablet 0  . Oxycodone HCl 10 MG TABS Take 1 tablet (10 mg total) by mouth every 6 (six) hours as needed. 120 tablet 0  . Oxycodone HCl 10 MG TABS Take 1 tablet (10 mg total) by mouth every 6 (six) hours as needed. 120 tablet 0  . pantoprazole (PROTONIX) 40 MG tablet Take 1 tablet (40 mg total) by mouth daily. 90 tablet 3  . polyethylene glycol (MIRALAX / GLYCOLAX) packet Take 8.5 g by mouth every other day.    . rizatriptan (MAXALT) 10 MG tablet Take 1 tablet (10 mg total) by mouth once as needed for up to 1 dose for migraine. May repeat in 2 hours if needed 24 tablet 3  . SUMAtriptan (IMITREX) 100 MG tablet TAKE 1 TABLET AT ONSET OF MIGRAINE- MAY REPEAT ONCE IN 2 HOURS. LIMIT2/24 HOURS. AVOID DAILY USE. 9 tablet 5  . topiramate (TOPAMAX) 25 MG tablet Take 1 tablet (25 mg total) by mouth at bedtime. 30 tablet 5  . Ubrogepant (UBRELVY) 100 MG TABS Take 100 mg by mouth daily as needed. 6 tablet 11  . verapamil (CALAN-SR) 120 MG  CR tablet Take 1 tablet (120 mg total) by mouth at bedtime. (Patient taking differently: Take 60 mg by mouth at bedtime. Pt taking 1/2 tabs) 90 tablet 3  . naproxen (NAPROSYN) 500 MG tablet Take 1 tablet (500 mg total) by mouth 2 (two) times daily with a meal. (Patient not taking: Reported on 04/24/2020) 60 tablet 3   No facility-administered medications prior to visit.    ROS: Review of Systems  Constitutional: Positive for unexpected weight change. Negative for activity change, appetite change, chills and fatigue.  HENT: Negative for congestion, mouth sores and sinus pressure.   Eyes: Negative for visual disturbance.  Respiratory: Negative for cough and chest tightness.   Gastrointestinal: Negative for abdominal pain and nausea.  Genitourinary: Negative for difficulty urinating, frequency and vaginal pain.  Musculoskeletal: Positive for back pain and gait problem.  Skin: Negative for pallor and rash.  Neurological: Negative for dizziness, tremors, weakness, numbness and headaches.  Psychiatric/Behavioral: Negative for confusion, sleep disturbance and suicidal ideas.    Objective:  BP 122/84 (BP Location: Right Arm, Patient Position: Sitting, Cuff Size: Normal)   Pulse 70   Temp 98.4 F (36.9 C) (Oral)   Ht 5\' 5"  (1.651 m)  Wt 153 lb (69.4 kg)   SpO2 99%   BMI 25.46 kg/m   BP Readings from Last 3 Encounters:  05/07/20 122/84  04/24/20 116/79  02/10/20 126/84    Wt Readings from Last 3 Encounters:  05/07/20 153 lb (69.4 kg)  04/24/20 152 lb 12.8 oz (69.3 kg)  02/10/20 168 lb 9.6 oz (76.5 kg)    Physical Exam Constitutional:      General: She is not in acute distress.    Appearance: She is well-developed.  HENT:     Head: Normocephalic.     Right Ear: External ear normal.     Left Ear: External ear normal.     Nose: Nose normal.  Eyes:     General:        Right eye: No discharge.        Left eye: No discharge.     Conjunctiva/sclera: Conjunctivae normal.      Pupils: Pupils are equal, round, and reactive to light.  Neck:     Thyroid: No thyromegaly.     Vascular: No JVD.     Trachea: No tracheal deviation.  Cardiovascular:     Rate and Rhythm: Normal rate and regular rhythm.     Heart sounds: Normal heart sounds.  Pulmonary:     Effort: No respiratory distress.     Breath sounds: No stridor. No wheezing.  Abdominal:     General: Bowel sounds are normal. There is no distension.     Palpations: Abdomen is soft. There is no mass.     Tenderness: There is no abdominal tenderness. There is no guarding or rebound.  Musculoskeletal:        General: Tenderness present.     Cervical back: Normal range of motion and neck supple.  Lymphadenopathy:     Cervical: No cervical adenopathy.  Skin:    Findings: No erythema or rash.  Neurological:     Cranial Nerves: No cranial nerve deficit.     Motor: No abnormal muscle tone.     Coordination: Coordination normal.     Deep Tendon Reflexes: Reflexes normal.  Psychiatric:        Behavior: Behavior normal.        Thought Content: Thought content normal.        Judgment: Judgment normal.     Lab Results  Component Value Date   WBC 4.3 10/31/2019   HGB 13.0 10/31/2019   HCT 39.3 10/31/2019   PLT 195.0 10/31/2019   GLUCOSE 86 10/31/2019   CHOL 199 10/31/2019   TRIG 72.0 10/31/2019   HDL 64.60 10/31/2019   LDLDIRECT 130.2 12/21/2013   LDLCALC 120 (H) 10/31/2019   ALT 29 10/31/2019   AST 34 10/31/2019   NA 139 10/31/2019   K 4.0 10/31/2019   CL 104 10/31/2019   CREATININE 0.81 10/31/2019   BUN 14 10/31/2019   CO2 28 10/31/2019   TSH 1.40 10/31/2019   HGBA1C 5.5 03/31/2018    MM 3D SCREEN BREAST BILATERAL  Result Date: 04/25/2020 CLINICAL DATA:  Screening. EXAM: DIGITAL SCREENING BILATERAL MAMMOGRAM WITH TOMO AND CAD COMPARISON:  Previous exam(s). ACR Breast Density Category c: The breast tissue is heterogeneously dense, which may obscure small masses. FINDINGS: There are no findings  suspicious for malignancy. Images were processed with CAD. IMPRESSION: No mammographic evidence of malignancy. A result letter of this screening mammogram will be mailed directly to the patient. RECOMMENDATION: Screening mammogram in one year. (Code:SM-B-01Y) BI-RADS CATEGORY  1: Negative. Electronically Signed  By: Abelardo Diesel M.D.   On: 04/25/2020 10:32    Assessment & Plan:    Walker Kehr, MD

## 2020-05-07 NOTE — Assessment & Plan Note (Signed)
On Topamax now

## 2020-05-07 NOTE — Assessment & Plan Note (Signed)
Dyslipidemia

## 2020-05-08 LAB — CBC WITH DIFFERENTIAL/PLATELET
Absolute Monocytes: 217 cells/uL (ref 200–950)
Basophils Absolute: 50 cells/uL (ref 0–200)
Basophils Relative: 0.8 %
Eosinophils Absolute: 87 cells/uL (ref 15–500)
Eosinophils Relative: 1.4 %
HCT: 39.5 % (ref 35.0–45.0)
Hemoglobin: 13.1 g/dL (ref 11.7–15.5)
Lymphs Abs: 2765 cells/uL (ref 850–3900)
MCH: 31.2 pg (ref 27.0–33.0)
MCHC: 33.2 g/dL (ref 32.0–36.0)
MCV: 94 fL (ref 80.0–100.0)
MPV: 12 fL (ref 7.5–12.5)
Monocytes Relative: 3.5 %
Neutro Abs: 3081 cells/uL (ref 1500–7800)
Neutrophils Relative %: 49.7 %
Platelets: 193 10*3/uL (ref 140–400)
RBC: 4.2 10*6/uL (ref 3.80–5.10)
RDW: 12.1 % (ref 11.0–15.0)
Total Lymphocyte: 44.6 %
WBC: 6.2 10*3/uL (ref 3.8–10.8)

## 2020-05-08 LAB — BASIC METABOLIC PANEL
BUN: 18 mg/dL (ref 7–25)
CO2: 29 mmol/L (ref 20–32)
Calcium: 10.1 mg/dL (ref 8.6–10.4)
Chloride: 104 mmol/L (ref 98–110)
Creat: 0.89 mg/dL (ref 0.50–0.99)
Glucose, Bld: 81 mg/dL (ref 65–99)
Potassium: 4 mmol/L (ref 3.5–5.3)
Sodium: 141 mmol/L (ref 135–146)

## 2020-05-08 LAB — HEPATIC FUNCTION PANEL
AG Ratio: 2 (calc) (ref 1.0–2.5)
ALT: 23 U/L (ref 6–29)
AST: 25 U/L (ref 10–35)
Albumin: 4.7 g/dL (ref 3.6–5.1)
Alkaline phosphatase (APISO): 58 U/L (ref 37–153)
Bilirubin, Direct: 0.1 mg/dL (ref 0.0–0.2)
Globulin: 2.3 g/dL (calc) (ref 1.9–3.7)
Indirect Bilirubin: 0.4 mg/dL (calc) (ref 0.2–1.2)
Total Bilirubin: 0.5 mg/dL (ref 0.2–1.2)
Total Protein: 7 g/dL (ref 6.1–8.1)

## 2020-05-08 LAB — TSH: TSH: 2.43 mIU/L (ref 0.40–4.50)

## 2020-05-22 DIAGNOSIS — H43811 Vitreous degeneration, right eye: Secondary | ICD-10-CM | POA: Diagnosis not present

## 2020-05-22 DIAGNOSIS — H2513 Age-related nuclear cataract, bilateral: Secondary | ICD-10-CM | POA: Diagnosis not present

## 2020-05-22 DIAGNOSIS — T1582XA Foreign body in other and multiple parts of external eye, left eye, initial encounter: Secondary | ICD-10-CM | POA: Diagnosis not present

## 2020-05-22 DIAGNOSIS — H04123 Dry eye syndrome of bilateral lacrimal glands: Secondary | ICD-10-CM | POA: Diagnosis not present

## 2020-06-08 ENCOUNTER — Ambulatory Visit (INDEPENDENT_AMBULATORY_CARE_PROVIDER_SITE_OTHER): Payer: PPO | Admitting: Neurology

## 2020-06-08 ENCOUNTER — Other Ambulatory Visit: Payer: Self-pay

## 2020-06-08 DIAGNOSIS — G43709 Chronic migraine without aura, not intractable, without status migrainosus: Secondary | ICD-10-CM

## 2020-06-08 MED ORDER — ONABOTULINUMTOXINA 100 UNITS IJ SOLR
170.0000 [IU] | Freq: Once | INTRAMUSCULAR | Status: AC
Start: 2020-06-08 — End: 2020-06-08
  Administered 2020-06-08: 155 [IU] via INTRAMUSCULAR

## 2020-06-08 NOTE — Progress Notes (Signed)
[facility]   Procedure Note Botox  Attending: Dr. Metta Clines  Preoperative Diagnosis(es): Chronic migraine  Consent obtained from: The patient Benefits discussed included, but were not limited to decreased muscle tightness, increased joint range of motion, and decreased pain.  Risk discussed included, but were not limited pain and discomfort, bleeding, bruising, excessive weakness, venous thrombosis, muscle atrophy and dysphagia.  Anticipated outcomes of the procedure as well as he risks and benefits of the alternatives to the procedure, and the roles and tasks of the personnel to be involved, were discussed with the patient, and the patient consents to the procedure and agrees to proceed. A copy of the patient medication guide was given to the patient which explains the blackbox warning.  Patients identity and treatment sites confirmed Yes.  .  Details of Procedure: Skin was cleaned with alcohol. Prior to injection, the needle plunger was aspirated to make sure the needle was not within a blood vessel.  There was no blood retrieved on aspiration.    Following is a summary of the muscles injected  And the amount of Botulinum toxin used:  Dilution 200 units of Botox was reconstituted with 4 ml of preservative free normal saline. Time of reconstitution: At the time of the office visit (<30 minutes prior to injection)   Injections  155 total units of Botox was injected with a 30 gauge needle.  Injection Sites: L occipitalis: 15 units- 3 sites  R occiptalis: 15 units- 3 sites  L upper trapezius: 15 units- 3 sites R upper trapezius: 15 units- 3 sits          L paraspinal: 10 units- 2 sites R paraspinal: 10 units- 2 sites  Face L frontalis(2 injection sites):10 units   R frontalis(2 injection sites):10 units         L corrugator: 5 units   R corrugator: 5 units           Procerus: 5 units   L temporalis: 20 units R temporalis: 20 units   Agent:  200 units of botulinum Type  A (Onobotulinum Toxin type A) was reconstituted with 4 ml of preservative free normal saline.  Time of reconstitution: At the time of the office visit (<30 minutes prior to injection)     Total injected (Units): 155  Total wasted (Units): 15  Patient tolerated procedure well without complications.   Reinjection is anticipated in 3 months.

## 2020-07-03 ENCOUNTER — Other Ambulatory Visit: Payer: Self-pay | Admitting: Internal Medicine

## 2020-07-18 ENCOUNTER — Other Ambulatory Visit: Payer: Self-pay | Admitting: Neurology

## 2020-07-18 ENCOUNTER — Other Ambulatory Visit: Payer: Self-pay | Admitting: Internal Medicine

## 2020-07-19 ENCOUNTER — Other Ambulatory Visit: Payer: Self-pay | Admitting: Neurology

## 2020-07-31 ENCOUNTER — Encounter: Payer: Self-pay | Admitting: Internal Medicine

## 2020-07-31 ENCOUNTER — Other Ambulatory Visit: Payer: Self-pay

## 2020-07-31 ENCOUNTER — Telehealth: Payer: Self-pay | Admitting: Internal Medicine

## 2020-07-31 ENCOUNTER — Ambulatory Visit (INDEPENDENT_AMBULATORY_CARE_PROVIDER_SITE_OTHER): Payer: PPO | Admitting: Internal Medicine

## 2020-07-31 VITALS — BP 150/108 | HR 73 | Temp 98.2°F | Ht 65.0 in | Wt 149.0 lb

## 2020-07-31 DIAGNOSIS — R635 Abnormal weight gain: Secondary | ICD-10-CM

## 2020-07-31 DIAGNOSIS — R030 Elevated blood-pressure reading, without diagnosis of hypertension: Secondary | ICD-10-CM

## 2020-07-31 DIAGNOSIS — G950 Syringomyelia and syringobulbia: Secondary | ICD-10-CM

## 2020-07-31 DIAGNOSIS — Z23 Encounter for immunization: Secondary | ICD-10-CM

## 2020-07-31 DIAGNOSIS — G43719 Chronic migraine without aura, intractable, without status migrainosus: Secondary | ICD-10-CM

## 2020-07-31 DIAGNOSIS — R21 Rash and other nonspecific skin eruption: Secondary | ICD-10-CM

## 2020-07-31 MED ORDER — OXYCODONE HCL 10 MG PO TABS
10.0000 mg | ORAL_TABLET | Freq: Four times a day (QID) | ORAL | 0 refills | Status: DC | PRN
Start: 2020-07-31 — End: 2020-10-30

## 2020-07-31 MED ORDER — CLOTRIMAZOLE-BETAMETHASONE 1-0.05 % EX CREA: 1.0000 "application " | TOPICAL_CREAM | Freq: Two times a day (BID) | CUTANEOUS | 1 refills | Status: DC

## 2020-07-31 NOTE — Assessment & Plan Note (Signed)
Beth Huffman

## 2020-07-31 NOTE — Assessment & Plan Note (Signed)
Wt Readings from Last 3 Encounters:  07/31/20 149 lb (67.6 kg)  05/07/20 153 lb (69.4 kg)  04/24/20 152 lb 12.8 oz (69.3 kg)   Lost wt

## 2020-07-31 NOTE — Assessment & Plan Note (Signed)
Chronic Dr Krista Blue Dr Jalene Mullet at [facility] Dr Tomi Likens Chronic pain

## 2020-07-31 NOTE — Assessment & Plan Note (Signed)
Check BP at home

## 2020-07-31 NOTE — Assessment & Plan Note (Signed)
Lotrisone 

## 2020-07-31 NOTE — Telephone Encounter (Signed)
    Pharmacy calling to clarify start dates on Oxycodone HCl 10 MG TABS  Please call Ph: [phone], ext [phone], ask for Maudie Mercury

## 2020-07-31 NOTE — Progress Notes (Signed)
Subjective:  Patient ID: Beth Huffman, female    DOB: [birth year]  Age: 59 y.o. MRN: [id number]  CC: No chief complaint on file.   HPI Beth Huffman presents for chronic pain, depression C/o rash on the abdomen Beth Huffman is complaining of stress with her granddaughter who is 54 years old and will need a feeding tube due to poor nutrition   Outpatient Medications Prior to Visit  Medication Sig Dispense Refill  . cholecalciferol (VITAMIN D) 1000 UNITS tablet Take 1,000 Units by mouth daily.      . cyclobenzaprine (FLEXERIL) 5 MG tablet TAKE 1 TABLET THREE TIMES DAILY AS NEEDED FOR MUSCLE SPASMS. 90 tablet 0  . estradiol (ESTRACE) 0.5 MG tablet Take 1 tablet (0.5 mg total) by mouth daily. 90 tablet 4  . ibuprofen (IBU) 600 MG tablet TAKE 1 TABLET EVERY 8 HOURS AS NEEDED FOR HEADACHE. 90 tablet 0  . Multiple Vitamin (MULTIVITAMIN PO) Take 1 tablet by mouth daily.      . naproxen (NAPROSYN) 500 MG tablet Take 1 tablet (500 mg total) by mouth 2 (two) times daily with a meal. 60 tablet 3  . ondansetron (ZOFRAN) 8 MG tablet Take 1 tablet (8 mg total) by mouth every 8 (eight) hours as needed for nausea or vomiting. 21 tablet 3  . Oxycodone HCl 10 MG TABS Take 1 tablet (10 mg total) by mouth 4 (four) times daily as needed. 120 tablet 0  . Oxycodone HCl 10 MG TABS Take 1 tablet (10 mg total) by mouth every 6 (six) hours as needed. 120 tablet 0  . Oxycodone HCl 10 MG TABS Take 1 tablet (10 mg total) by mouth every 6 (six) hours as needed. 120 tablet 0  . pantoprazole (PROTONIX) 40 MG tablet Take 1 tablet (40 mg total) by mouth daily. 90 tablet 3  . polyethylene glycol (MIRALAX / GLYCOLAX) packet Take 8.5 g by mouth every other day.    . rizatriptan (MAXALT) 10 MG tablet Take 1 tablet (10 mg total) by mouth once as needed for up to 1 dose for migraine. May repeat in 2 hours if needed 24 tablet 3  . SUMAtriptan (IMITREX) 100 MG tablet TAKE 1 TABLET AT ONSET OF MIGRAINE- MAY REPEAT ONCE IN 2 HOURS.  LIMIT 2/24 HOURS. AVOID DAILY USE. 9 tablet 3  . topiramate (TOPAMAX) 25 MG tablet TAKE ONE TABLET AT BEDTIME. 30 tablet 0  . Ubrogepant (UBRELVY) 100 MG TABS Take 100 mg by mouth daily as needed. 6 tablet 11  . verapamil (CALAN-SR) 120 MG CR tablet Take 1 tablet (120 mg total) by mouth at bedtime. (Patient taking differently: Take 60 mg by mouth at bedtime. Pt taking 1/2 tabs) 90 tablet 3   No facility-administered medications prior to visit.    ROS: Review of Systems  Constitutional: Negative for activity change, appetite change, chills, fatigue and unexpected weight change.  HENT: Negative for congestion, mouth sores and sinus pressure.   Eyes: Negative for visual disturbance.  Respiratory: Negative for cough and chest tightness.   Gastrointestinal: Negative for abdominal pain and nausea.  Genitourinary: Negative for difficulty urinating, frequency and vaginal pain.  Musculoskeletal: Positive for back pain. Negative for gait problem.  Skin: Positive for rash. Negative for pallor.  Neurological: Negative for dizziness, tremors, weakness, numbness and headaches.  Psychiatric/Behavioral: Negative for confusion, sleep disturbance and suicidal ideas. The patient is nervous/anxious.     Objective:  BP (!) 150/108 (BP Location: Left Arm, Patient Position: Sitting, Cuff Size:  Normal)   Pulse 73   Temp 98.2 F (36.8 C) (Oral)   Ht 5\' 5"  (1.651 m)   Wt 149 lb (67.6 kg)   SpO2 99%   BMI 24.79 kg/m   BP Readings from Last 3 Encounters:  07/31/20 (!) 150/108  05/07/20 122/84  04/24/20 116/79    Wt Readings from Last 3 Encounters:  07/31/20 149 lb (67.6 kg)  05/07/20 153 lb (69.4 kg)  04/24/20 152 lb 12.8 oz (69.3 kg)    Physical Exam Constitutional:      General: She is not in acute distress.    Appearance: She is well-developed.  HENT:     Head: Normocephalic.     Right Ear: External ear normal.     Left Ear: External ear normal.     Nose: Nose normal.  Eyes:      General:        Right eye: No discharge.        Left eye: No discharge.     Conjunctiva/sclera: Conjunctivae normal.     Pupils: Pupils are equal, round, and reactive to light.  Neck:     Thyroid: No thyromegaly.     Vascular: No JVD.     Trachea: No tracheal deviation.  Cardiovascular:     Rate and Rhythm: Normal rate and regular rhythm.     Heart sounds: Normal heart sounds.  Pulmonary:     Effort: No respiratory distress.     Breath sounds: No stridor. No wheezing.  Abdominal:     General: Bowel sounds are normal. There is no distension.     Palpations: Abdomen is soft. There is no mass.     Tenderness: There is no abdominal tenderness. There is no guarding or rebound.  Musculoskeletal:        General: Tenderness present.     Cervical back: Normal range of motion and neck supple.  Lymphadenopathy:     Cervical: No cervical adenopathy.  Skin:    Findings: Rash present. No erythema.  Neurological:     Mental Status: She is oriented to person, place, and time.     Cranial Nerves: No cranial nerve deficit.     Motor: No abnormal muscle tone.     Coordination: Coordination normal.     Deep Tendon Reflexes: Reflexes normal.  Psychiatric:        Behavior: Behavior normal.        Thought Content: Thought content normal.        Judgment: Judgment normal.    Mild rash patch - epig area  Lab Results  Component Value Date   WBC 6.2 05/07/2020   HGB 13.1 05/07/2020   HCT 39.5 05/07/2020   PLT 193 05/07/2020   GLUCOSE 81 05/07/2020   CHOL 199 10/31/2019   TRIG 72.0 10/31/2019   HDL 64.60 10/31/2019   LDLDIRECT 130.2 12/21/2013   LDLCALC 120 (H) 10/31/2019   ALT 23 05/07/2020   AST 25 05/07/2020   NA 141 05/07/2020   K 4.0 05/07/2020   CL 104 05/07/2020   CREATININE 0.89 05/07/2020   BUN 18 05/07/2020   CO2 29 05/07/2020   TSH 2.43 05/07/2020   HGBA1C 5.5 03/31/2018    MM 3D SCREEN BREAST BILATERAL  Result Date: 04/25/2020 CLINICAL DATA:  Screening. EXAM: DIGITAL  SCREENING BILATERAL MAMMOGRAM WITH TOMO AND CAD COMPARISON:  Previous exam(s). ACR Breast Density Category c: The breast tissue is heterogeneously dense, which may obscure small masses. FINDINGS: There are no findings suspicious  for malignancy. Images were processed with CAD. IMPRESSION: No mammographic evidence of malignancy. A result letter of this screening mammogram will be mailed directly to the patient. RECOMMENDATION: Screening mammogram in one year. (Code:SM-B-01Y) BI-RADS CATEGORY  1: Negative. Electronically Signed   By: Abelardo Diesel M.D.   On: 04/25/2020 10:32    Assessment & Plan:    Follow-up: No follow-ups on file.  Walker Kehr, MD

## 2020-08-01 NOTE — Telephone Encounter (Signed)
Lynnae Sandhoff she states pt had already had a script back in July that was filled 07/31/20. The scripts that was received yesterday wanting to know what to do since pt had script for Oct already. Inform Kim to cancel the 10/5 script, and the other two will be for 11/5, and 12/5...Johny Chess

## 2020-08-07 ENCOUNTER — Encounter: Payer: Self-pay | Admitting: Neurology

## 2020-08-07 NOTE — Progress Notes (Addendum)
Patient's BV said no preference for SP; Chose [facility] for patient. Called them and set up her account and gave verbal script for Botox. Awaiting insurance verification and patient consent to set up delivery.   [facility]ener Key: [id number] - [place] Case ID: [id number] Need help? Call us at [phone] Outcome Approvedon October 14 PA Case: [id number], Status: Approved, Coverage Starts on: 08/09/2020 12:00:00 AM, Coverage Ends on: 10/26/2020 12:00:00 AM. Drug Botox 200UNIT solution Form [employer] 4-Part Electronic [place] Form [id number] NCPDP)

## 2020-08-16 ENCOUNTER — Other Ambulatory Visit: Payer: Self-pay | Admitting: Internal Medicine

## 2020-08-20 ENCOUNTER — Other Ambulatory Visit: Payer: Self-pay | Admitting: Internal Medicine

## 2020-09-06 ENCOUNTER — Telehealth: Payer: PPO | Admitting: Internal Medicine

## 2020-09-07 ENCOUNTER — Other Ambulatory Visit: Payer: Self-pay

## 2020-09-07 ENCOUNTER — Ambulatory Visit (INDEPENDENT_AMBULATORY_CARE_PROVIDER_SITE_OTHER): Payer: PPO | Admitting: Neurology

## 2020-09-07 DIAGNOSIS — G43709 Chronic migraine without aura, not intractable, without status migrainosus: Secondary | ICD-10-CM | POA: Diagnosis not present

## 2020-09-07 MED ORDER — ONABOTULINUMTOXINA 100 UNITS IJ SOLR
200.0000 [IU] | Freq: Once | INTRAMUSCULAR | Status: AC
  Administered 2020-09-07: 155 [IU] via INTRAMUSCULAR

## 2020-09-07 NOTE — Progress Notes (Signed)
[facility]  ° °Procedure Note Botox ° °Attending: Dr. Tahiri Shareef ° °Preoperative Diagnosis(es): Chronic migraine ° °Consent obtained from: The patient °Benefits discussed included, but were not limited to decreased muscle tightness, increased joint range of motion, and decreased pain.  Risk discussed included, but were not limited pain and discomfort, bleeding, bruising, excessive weakness, venous thrombosis, muscle atrophy and dysphagia.  Anticipated outcomes of the procedure as well as he risks and benefits of the alternatives to the procedure, and the roles and tasks of the personnel to be involved, were discussed with the patient, and the patient consents to the procedure and agrees to proceed. A copy of the patient medication guide was given to the patient which explains the blackbox warning. ° °Patients identity and treatment sites confirmed Yes.  . ° °Details of Procedure: °Skin was cleaned with alcohol. Prior to injection, the needle plunger was aspirated to make sure the needle was not within a blood vessel.  There was no blood retrieved on aspiration.   ° °Following is a summary of the muscles injected  And the amount of Botulinum toxin used: ° °Dilution °200 units of Botox was reconstituted with 4 ml of preservative free normal saline. °Time of reconstitution: At the time of the office visit (<30 minutes prior to injection)  ° °Injections  °155 total units of Botox was injected with a 30 gauge needle. ° °Injection Sites: °L occipitalis: 15 units- 3 sites  °R occiptalis: 15 units- 3 sites ° °L upper trapezius: 15 units- 3 sites °R upper trapezius: 15 units- 3 sits          °L paraspinal: 10 units- 2 sites °R paraspinal: 10 units- 2 sites ° °Face °L frontalis(2 injection sites):10 units   °R frontalis(2 injection sites):10 units         °L corrugator: 5 units   °R corrugator: 5 units           °Procerus: 5 units   °L temporalis: 20 units °R temporalis: 20 units  ° °Agent:  °200 units of botulinum Type  A (Onobotulinum Toxin type A) was reconstituted with 4 ml of preservative free normal saline.  °Time of reconstitution: At the time of the office visit (<30 minutes prior to injection)  ° ° ° Total injected (Units):  155 ° Total wasted (Units):  45 ° °Patient tolerated procedure well without complications.   °Reinjection is anticipated in 3 months. ° ° °

## 2020-09-14 ENCOUNTER — Other Ambulatory Visit: Payer: Self-pay | Admitting: Internal Medicine

## 2020-09-30 ENCOUNTER — Other Ambulatory Visit: Payer: Self-pay | Admitting: Internal Medicine

## 2020-10-14 ENCOUNTER — Other Ambulatory Visit: Payer: Self-pay | Admitting: Internal Medicine

## 2020-10-30 ENCOUNTER — Encounter: Payer: Self-pay | Admitting: Internal Medicine

## 2020-10-30 ENCOUNTER — Telehealth (INDEPENDENT_AMBULATORY_CARE_PROVIDER_SITE_OTHER): Payer: PPO | Admitting: Internal Medicine

## 2020-10-30 DIAGNOSIS — G950 Syringomyelia and syringobulbia: Secondary | ICD-10-CM

## 2020-10-30 DIAGNOSIS — G4700 Insomnia, unspecified: Secondary | ICD-10-CM | POA: Insufficient documentation

## 2020-10-30 DIAGNOSIS — G43719 Chronic migraine without aura, intractable, without status migrainosus: Secondary | ICD-10-CM

## 2020-10-30 DIAGNOSIS — F5101 Primary insomnia: Secondary | ICD-10-CM

## 2020-10-30 MED ORDER — OXYCODONE HCL 10 MG PO TABS: 10.0000 mg | ORAL_TABLET | Freq: Four times a day (QID) | ORAL | 0 refills | Status: DC | PRN

## 2020-10-30 MED ORDER — TRAZODONE HCL 50 MG PO TABS
25.0000 mg | ORAL_TABLET | Freq: Every evening | ORAL | 5 refills | Status: DC | PRN
Start: 2020-10-30 — End: 2021-10-30

## 2020-10-30 NOTE — Progress Notes (Signed)
Virtual Visit via Video Note  I connected with Beth Huffman on 10/30/20 at  8:50 AM EST by a video enabled telemedicine application and verified that I am speaking with the correct person using two identifiers.   I discussed the limitations of evaluation and management by telemedicine and the availability of in person appointments. The patient expressed understanding and agreed to proceed.  I was located at our [facility] office. The patient was at home. There was no one else present in the visit.   History of Present Illness: We need to follow-up on chronic pain, syringomyelia, headaches [name] is complaining of insomnia, hard to stay asleep. There has been no runny nose, cough, chest pain, shortness of breath, abdominal pain, diarrhea, skin rashes.   Observations/Objective: The patient appears to be in no acute distress  Assessment and Plan:  See my Assessment and Plan. Follow Up Instructions:    I discussed the assessment and treatment plan with the patient. The patient was provided an opportunity to ask questions and all were answered. The patient agreed with the plan and demonstrated an understanding of the instructions.   The patient was advised to call back or seek an in-person evaluation if the symptoms worsen or if the condition fails to improve as anticipated.  I provided face-to-face time during this encounter. We were at different locations.   Sonda Primes, MD

## 2020-10-30 NOTE — Assessment & Plan Note (Addendum)
Worse.  We'll try a low-dose trazodone 25-50 mg at at bedtime

## 2020-10-30 NOTE — Assessment & Plan Note (Signed)
On verapamil Maxalt as needed

## 2020-10-30 NOTE — Assessment & Plan Note (Signed)
Follow-up with neurology.  Treat chronic pain.

## 2020-11-19 NOTE — Progress Notes (Signed)
Beth Huffman KeyVerneita Griffes - PA Case ID: [id number] Need help? Call us at [phone] Outcome Approvedon January 23 PA Case: [id number], Status: Approved, Coverage Starts on: 11/18/2020 12:00:00 AM, Coverage Ends on: 10/26/2021 12:00:00 AM. Drug Botox 200UNIT solution Form [employer] 4-Part Electronic [place] Form [phone] NCPDP)

## 2020-11-29 ENCOUNTER — Other Ambulatory Visit: Payer: Self-pay | Admitting: Internal Medicine

## 2020-12-04 ENCOUNTER — Ambulatory Visit: Payer: PPO | Admitting: Internal Medicine

## 2020-12-07 ENCOUNTER — Other Ambulatory Visit: Payer: Self-pay

## 2020-12-07 ENCOUNTER — Ambulatory Visit (INDEPENDENT_AMBULATORY_CARE_PROVIDER_SITE_OTHER): Payer: PPO | Admitting: Neurology

## 2020-12-07 DIAGNOSIS — G43709 Chronic migraine without aura, not intractable, without status migrainosus: Secondary | ICD-10-CM | POA: Diagnosis not present

## 2020-12-07 MED ORDER — ONABOTULINUMTOXINA 100 UNITS IJ SOLR
200.0000 [IU] | Freq: Once | INTRAMUSCULAR | Status: AC
Start: 2020-12-07 — End: 2020-12-07
  Administered 2020-12-07: 155 [IU] via INTRAMUSCULAR

## 2020-12-07 NOTE — Progress Notes (Signed)
[facility] Clinic  ° °Procedure Note Botox ° °Attending: Dr. Zahi Plaskett ° °Preoperative Diagnosis(es): Chronic migraine ° °Consent obtained from: The patient °Benefits discussed included, but were not limited to decreased muscle tightness, increased joint range of motion, and decreased pain.  Risk discussed included, but were not limited pain and discomfort, bleeding, bruising, excessive weakness, venous thrombosis, muscle atrophy and dysphagia.  Anticipated outcomes of the procedure as well as he risks and benefits of the alternatives to the procedure, and the roles and tasks of the personnel to be involved, were discussed with the patient, and the patient consents to the procedure and agrees to proceed. A copy of the patient medication guide was given to the patient which explains the blackbox warning. ° °Patients identity and treatment sites confirmed Yes.  . ° °Details of Procedure: °Skin was cleaned with alcohol. Prior to injection, the needle plunger was aspirated to make sure the needle was not within a blood vessel.  There was no blood retrieved on aspiration.   ° °Following is a summary of the muscles injected  And the amount of Botulinum toxin used: ° °Dilution °200 units of Botox was reconstituted with 4 ml of preservative free normal saline. °Time of reconstitution: At the time of the office visit (<30 minutes prior to injection)  ° °Injections  °155 total units of Botox was injected with a 30 gauge needle. ° °Injection Sites: °L occipitalis: 15 units- 3 sites  °R occiptalis: 15 units- 3 sites ° °L upper trapezius: 15 units- 3 sites °R upper trapezius: 15 units- 3 sits          °L paraspinal: 10 units- 2 sites °R paraspinal: 10 units- 2 sites ° °Face °L frontalis(2 injection sites):10 units   °R frontalis(2 injection sites):10 units         °L corrugator: 5 units   °R corrugator: 5 units           °Procerus: 5 units   °L temporalis: 20 units °R temporalis: 20 units  ° °Agent:  °200 units of botulinum Type  A (Onobotulinum Toxin type A) was reconstituted with 4 ml of preservative free normal saline.  °Time of reconstitution: At the time of the office visit (<30 minutes prior to injection)  ° ° ° Total injected (Units):  155 ° Total wasted (Units):  45 ° °Patient tolerated procedure well without complications.   °Reinjection is anticipated in 3 months. ° ° °

## 2020-12-11 ENCOUNTER — Ambulatory Visit: Payer: PPO | Admitting: Neurology

## 2020-12-12 ENCOUNTER — Other Ambulatory Visit: Payer: Self-pay | Admitting: Internal Medicine

## 2020-12-18 NOTE — Progress Notes (Signed)
NEUROLOGY FOLLOW UP OFFICE NOTE  Beth Huffman [id number]  Assessment/Plan:   Chronic migraine without aura, without status migrainosus, not intractable.  1.  Migraine prevention:  Botox.  I will see if we can start her on Nurtec as well to supplement increased headaches during the last 6 weeks before next round of Botox 2.  Migraine rescue:  Sumatriptan or rizatriptan 3.  Limit use of pain relievers to no more than 2 days out of week to prevent risk of rebound or medication-overuse headache. 4.  Keep headache diary 5.  Follow up for next Botox  Subjective:  Beth Huffman is a 61year old right-handed Caucasian woman who follows up for migraines.  UPDATE: Reports dry eye in her left eye with pulsating sensation.  Has upcoming eye appointment.  Increased headaches since discontinuation of topiramate (she was unable to tolerate it). Intensity:severe Duration:1 hour Frequency:15 to 20 days a month for the second half of 2nd month and all of 3rd month before next round of Botox Current NSAIDS:ibuprofen, naproxen (take with triptan) Current analgesics:oxycodone Current triptans:Maxalt 10mg ORsumatriptan 100mg  Current ergotamine:none Current anti-emetic:Zofran 8mg  Current muscle relaxants:Flexeril 5mg  Current anti-anxiolytic:none Current sleep aide:none Current Antihypertensive medications:Verapamil CR 128mg  Current Antidepressant medications:none Current Anticonvulsant medications:none Current anti-CGRP:none Current Vitamins/Herbal/Supplements:none Current Antihistamines/Decongestants:Flonase Other therapy:Botox status Hormone/birth control:estradiol  Caffeine:1 1/2 cups coffee daily Diet:Keeps hydrated. Does not skip meals. On Optivia diet. Lost 30 lbs since January Exercise:Walks dogs Depression:no; Anxiety:no Other pain:Spinal pain related to perineural cysts. Recent knee replacement, still with mild  soreness Sleep hygiene:good  HISTORY: Onset: Since the early 1990s. She was evaluated at [facility] 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 MRI of spine from 2005 showed a syrinx from T3 to T12 levels. She subsequently underwent occipital posterior fossa decompression surgery that year. A CT cystogram in 2007 showed residual cyst in the posterior fossa with postoperative changes but no hydrocephalus or other intracranial mass. Headaches subsided following decompression. They started to return in May 2016. She was previouslyfollowed by Dr. Krista [facility]. Location:Bifrontal/posterior neck pain Quality:Pressure, throbbing Initial intensity:10/10.shedenies new headache, thunderclap headache or severe headache that wakes herfrom sleep. Aura:no Premonitory Phase:no Postdrome:no Associated symptoms:With or withoutnausea,photophobia, phonophobia.Shedenies associated unilateral numbness or weakness. InitialDuration:1-2 days. Often in the morning when she wakes up InitialFrequency:15 days a month InitialFrequency of abortive medication:sumatriptan or rizatriptan 12-15 days a month Triggers: Prolonged standingor activity/lifting Relieving factors: Just medications. Activity:Can't function  Past NSAIDS:Cambia, naproxen Past analgesics:Excedrin, Tylenol Past abortive triptans:Relpax 40mg , Zomig 5mg  NS, sumatriptan [place] Past abortive ergotamine:none Past muscle relaxants:none Past anti-emetic:none Past antihypertensive medications:atenolol Past antidepressant medications:Nortriptyline (did not feel well on it) Past anticonvulsant medications:Trokendi XR,topiramate 200mg  twice daily;gabapentin, Lyrica Past anti-CGRP:Emgality (palpitations) Past  vitamins/Herbal/Supplements:none Past antihistamines/decongestants:none Other past therapies:Botox(effective)  Family history of headache:No  PAST MEDICAL HISTORY: Past Medical History:  Diagnosis Date  . Abnormal weight gain   . Acute sinusitis 12/05/2008   1/18 8/18  . Arachnoid cyst 08/19/2011  . Chest tightness or pressure 05/07/2018  . Constipation 09/08/2012   Chronic - opioid related 11/17 [place]  . Contact dermatitis 03/16/2013   5/14 relapsing - poison ivy   . Dyspnea 05/07/2018  . Elevated liver enzymes 03/04/2011   Chronic off and on   . Female stress incontinence 02/06/2009   Qualifier: Diagnosis of  By: Plotnikov MD, Evie Lacks   . FEVER UNSPECIFIED  09/29/2007   Qualifier: Diagnosis of  By: Plotnikov MD, Evie Lacks   . GERD 09/29/2007   Chronic  Protonix prn  Potential benefits of a long term PPI use as well as potential risks  and complications were explained to the patient and were aknowledged.    Marland Kitchen GERD (gastroesophageal reflux disease)   . Headache 12/20/2007   Dr Hassell Done Migraines (1 per 2 wks) Topamax 400 mg/d; Imitrex prn; Ibuprofen prn, Zofran prn   . Hyperglycemia 08/19/2011   Mild    . Hypertension   . Increased blood pressure (not hypertension) 08/23/2014  . Intractable migraine 06/14/2009   Chronic Ibuprofen, Imitrex, Topamax,  Nortriptyline - intolerant  . Knee pain 03/04/2011   R knee 2018 L knee pain - L knee b anserina and L knee is tender w/ROM: L knee pain is worse - patellofemoral syndrome vs other  . LBP (low back pain)   . LOW BACK PAIN 09/29/2007   Chronic, lately (2016) disabling Due to syringomyelia On Oxycodone  Potential benefits of a long term opioids use as well as potential risks (i.e. addiction risk, apnea etc) and complications (i.e. Somnolence, constipation and others) were explained to the patient and were aknowledged.    . LUQ PAIN 05/23/2010   Qualifier: Diagnosis of  By: Plotnikov MD, Evie Lacks   . Migraine, chronic, without aura,  intractable 10/28/2017   Botox approved diagnosis. 7/19 Worse. Start Emagility inj. Relpax  . Migraines   . Nausea & vomiting 08/19/2011  . Nausea without vomiting 06/14/2009   Due to migraines    . NECK PAIN 06/14/2009    Chronic, lately (2016) disabling Due to syringomyelia On Oxycodone  Potential benefits of a long term opioids use as well as potential risks (i.e. addiction risk, apnea etc) and complications (i.e. Somnolence, constipation and others) were explained to the patient and were aknowledged.    Marland Kitchen NONSPECIFIC ABNORMAL RESULTS LIVR FUNCTION STUDY 09/28/2009   Qualifier: Diagnosis of  By: Plotnikov MD, [place] 05/23/2010   Chronic - compulsive shopping   . Onychomycosis 01/30/2016   4/17   . Orthostatic hypotension 06/30/2013   9/14 likely Rapaflo induced   . OTITIS EXTERNA 05/10/2008   Qualifier: Diagnosis of  By: Plotnikov MD, Evie Lacks   . Palpitations 06/21/2014  . PARESTHESIA 07/13/2008   Qualifier: Diagnosis of  By: Plotnikov MD, Evie Lacks   . Partial thickness burn of left wrist 09/11/2015  . Postoperative state 07/24/2016  . Pyelonephritis 2008  . Rash 07/27/2017  . Stress 2009  . Syncope, near 06/30/2013   9/14 likely Rapaflo induced 12/14 relapsing off Rapaflo x 7 2/15 no relapsing since on 1/2 dose of Topamax   . Syringobulbia ([place])   . SYRINGOMYELIA 08/20/2007   Chronic Dr Krista Blue Dr Jalene Mullet at [facility] Chronic pain   . Syringomyelia ([place])   . Tachycardia   . Tonsillitis, chronic 02/26/2017   2018 worse  . URINARY TRACT INFECTION (UTI) 03/23/2008   Qualifier: Diagnosis of  By: Plotnikov MD, Evie Lacks   . UTI (lower urinary tract infection)   . Well adult exam 12/21/2013   We discussed age appropriate health related issues, including available/recomended screening tests and vaccinations. We discussed a need for adhering to healthy diet and exercise. Labs/EKG were reviewed/ordered. All questions were answered.       MEDICATIONS: Current  Outpatient Medications on File Prior to Visit  Medication Sig Dispense Refill  . cholecalciferol (VITAMIN D) 1000 UNITS tablet  Take 1,000 Units by mouth daily.      . clotrimazole-betamethasone (LOTRISONE) cream Apply 1 application topically 2 (two) times daily. 45 g 1  . cyclobenzaprine (FLEXERIL) 5 MG tablet TAKE 1 TABLET THREE TIMES DAILY AS NEEDED FOR MUSCLE SPASMS. 90 tablet 0  . estradiol (ESTRACE) 0.5 MG tablet Take 1 tablet (0.5 mg total) by mouth daily. 90 tablet 4  . ibuprofen (IBU) 600 MG tablet TAKE 1 TABLET EVERY 8 HOURS AS NEEDED FOR HEADACHE. 90 tablet 1  . Multiple Vitamin (MULTIVITAMIN PO) Take 1 tablet by mouth daily.      . ondansetron (ZOFRAN) 8 MG tablet TAKE 1 TABLET EVERY 8 HOURS AS NEEDED FOR NAUSEA AND VOMITING. 21 tablet 3  . Oxycodone HCl 10 MG TABS Take 1 tablet (10 mg total) by mouth 4 (four) times daily as needed. 120 tablet 0  . Oxycodone HCl 10 MG TABS Take 1 tablet (10 mg total) by mouth every 6 (six) hours as needed. 120 tablet 0  . Oxycodone HCl 10 MG TABS Take 1 tablet (10 mg total) by mouth every 6 (six) hours as needed. 120 tablet 0  . pantoprazole (PROTONIX) 40 MG tablet TAKE 1 TABLET ONCE DAILY. 90 tablet 3  . polyethylene glycol (MIRALAX / GLYCOLAX) packet Take 8.5 g by mouth every other day.    . rizatriptan (MAXALT) 10 MG tablet TAKE 1 TABLET AT ONSET OF MIGRAINE- MAY REPEAT ONCE IN 2 HOURS. LIMIT 2/24 HOURS. AVOID DAILY USE. 8 tablet 3  . SUMAtriptan (IMITREX) 100 MG tablet TAKE 1 TABLET AT ONSET OF MIGRAINE- MAY REPEAT ONCE IN 2 HOURS. LIMIT 2/24 HOURS. AVOID DAILY USE. 9 tablet 3  . topiramate (TOPAMAX) 25 MG tablet TAKE ONE TABLET AT BEDTIME. 30 tablet 0  . traZODone (DESYREL) 50 MG tablet Take 0.5-1 tablets (25-50 mg total) by mouth at bedtime as needed for sleep. 30 tablet 5  . Ubrogepant (UBRELVY) 100 MG TABS Take 100 mg by mouth daily as needed. 6 tablet 11  . verapamil (CALAN-SR) 120 MG CR tablet Take 1 tablet (120 mg total) by mouth at bedtime.  (Patient taking differently: Take 60 mg by mouth at bedtime. Pt taking 1/2 tabs) 90 tablet 3   No current facility-administered medications on file prior to visit.    ALLERGIES: Allergies  Allergen Reactions  . Atenolol Other (See Comments)    Wt gain  . Cefuroxime Axetil Nausea Only and Other (See Comments)    REACTION: nausea - but tolerates PCN  . Codeine Sulfate Nausea And Vomiting and Nausea Only  . Fentanyl Nausea And Vomiting and Other (See Comments)    REACTION: bad reaction  . Nitrofurantoin Other (See Comments)    Unknown  . Pregabalin Other (See Comments)    REACTION: cp  . Rapaflo [Silodosin] Other (See Comments)    Orthostatic BP drop, near syncope   . Tramadol Hcl Nausea And Vomiting    REACTION: sick  . Iodinated Diagnostic Agents Rash    FAMILY HISTORY: Family History  Problem Relation Age of Onset  . COPD Mother   . Peripheral vascular disease Mother   . Hypertension Mother   . Heart disease Mother   . Hypertension Father   . Anxiety disorder Other   . Heart attack Maternal Grandmother   . Stroke Maternal Grandfather   . Stroke Paternal Grandfather   . Colon cancer Maternal Uncle 66  . Breast cancer Maternal Aunt      Objective:  Blood pressure 111/74, pulse  77, height 5\' 5"  (1.651 m), weight 152 lb 9.6 oz (69.2 kg), SpO2 97 %. General: No acute distress.  Patient appears well-groomed.       Metta Clines, DO  CC: Lew Dawes, MD

## 2020-12-19 ENCOUNTER — Ambulatory Visit: Payer: PPO | Admitting: Neurology

## 2020-12-19 ENCOUNTER — Encounter: Payer: Self-pay | Admitting: Neurology

## 2020-12-19 ENCOUNTER — Other Ambulatory Visit: Payer: Self-pay

## 2020-12-19 VITALS — BP 111/74 | HR 77 | Ht 65.0 in | Wt 152.6 lb

## 2020-12-19 DIAGNOSIS — G43709 Chronic migraine without aura, not intractable, without status migrainosus: Secondary | ICD-10-CM

## 2020-12-19 MED ORDER — NURTEC 75 MG PO TBDP: 75.0000 mg | ORAL_TABLET | Freq: Every day | ORAL | 5 refills | Status: DC | PRN

## 2020-12-19 NOTE — Patient Instructions (Signed)
1.  Continue Botox 2.  Plan is to start you on Nurtec - 1 tablet every other day.  Will let you know if approved. 3.  Sumatriptan or rizatripan as needed.  Limit use of pain relievers to no more than 2 days out of week to prevent risk of rebound or medication-overuse headache. 4.  Keep headache diary 5.  Follow up for next Botox

## 2020-12-24 ENCOUNTER — Encounter: Payer: Self-pay | Admitting: Neurology

## 2020-12-24 NOTE — Progress Notes (Signed)
Beth Huffman (Key: [id number]) Nurtec 75MG  dispersible tablets   Form [employer] 4-Part Electronic PA Form (2017 NCPDP) Created 5 days ago Sent to Plan 5 days ago Plan Response 5 days ago Submit Clinical Questions 5 days ago Determination Favorable 3 days ago Message from Plan PA Case: [id number], Status: Approved, Coverage Starts on: 12/21/2020 12:00:00 AM, Coverage Ends on: 12/31/ 2022 12:00:00 AM.  This is for the 16 tablets per 30 days.

## 2021-01-08 DIAGNOSIS — H0102B Squamous blepharitis left eye, upper and lower eyelids: Secondary | ICD-10-CM | POA: Diagnosis not present

## 2021-01-08 DIAGNOSIS — H43813 Vitreous degeneration, bilateral: Secondary | ICD-10-CM | POA: Diagnosis not present

## 2021-01-08 DIAGNOSIS — H2513 Age-related nuclear cataract, bilateral: Secondary | ICD-10-CM | POA: Diagnosis not present

## 2021-01-08 DIAGNOSIS — H0102A Squamous blepharitis right eye, upper and lower eyelids: Secondary | ICD-10-CM | POA: Diagnosis not present

## 2021-01-08 DIAGNOSIS — H04123 Dry eye syndrome of bilateral lacrimal glands: Secondary | ICD-10-CM | POA: Diagnosis not present

## 2021-01-15 ENCOUNTER — Other Ambulatory Visit: Payer: Self-pay | Admitting: Internal Medicine

## 2021-01-24 ENCOUNTER — Other Ambulatory Visit: Payer: Self-pay

## 2021-01-24 ENCOUNTER — Ambulatory Visit (INDEPENDENT_AMBULATORY_CARE_PROVIDER_SITE_OTHER): Payer: PPO | Admitting: Internal Medicine

## 2021-01-24 ENCOUNTER — Encounter: Payer: Self-pay | Admitting: Internal Medicine

## 2021-01-24 DIAGNOSIS — G8929 Other chronic pain: Secondary | ICD-10-CM | POA: Diagnosis not present

## 2021-01-24 DIAGNOSIS — B372 Candidiasis of skin and nail: Secondary | ICD-10-CM

## 2021-01-24 DIAGNOSIS — G43719 Chronic migraine without aura, intractable, without status migrainosus: Secondary | ICD-10-CM | POA: Diagnosis not present

## 2021-01-24 DIAGNOSIS — M5441 Lumbago with sciatica, right side: Secondary | ICD-10-CM

## 2021-01-24 DIAGNOSIS — M5442 Lumbago with sciatica, left side: Secondary | ICD-10-CM

## 2021-01-24 MED ORDER — OXYCODONE HCL 10 MG PO TABS: 10.0000 mg | ORAL_TABLET | Freq: Four times a day (QID) | ORAL | 0 refills | Status: DC | PRN

## 2021-01-24 MED ORDER — OXYCODONE HCL 10 MG PO TABS
10.0000 mg | ORAL_TABLET | Freq: Four times a day (QID) | ORAL | 0 refills | Status: DC | PRN
Start: 2021-01-24 — End: 2021-01-24

## 2021-01-24 MED ORDER — KETOCONAZOLE 200 MG PO TABS: 200.0000 mg | ORAL_TABLET | Freq: Every day | ORAL | 1 refills | Status: DC

## 2021-01-24 MED ORDER — KETOCONAZOLE 2 % EX CREA: 1.0000 "application " | TOPICAL_CREAM | Freq: Every day | CUTANEOUS | 1 refills | Status: DC

## 2021-01-24 MED ORDER — OXYCODONE HCL 10 MG PO TABS
10.0000 mg | ORAL_TABLET | Freq: Four times a day (QID) | ORAL | 0 refills | Status: DC | PRN
Start: 2021-01-24 — End: 2021-04-25

## 2021-01-24 NOTE — Progress Notes (Signed)
Subjective:  Patient ID: Beth Huffman, female    DOB: Aug 10, 1961  Age: 60 y.o. MRN: [id number]  CC: Follow-up (3 month)   HPI Beth Huffman presents for chronic pain, OA, HAs C/o rash between breasts, buttocks  Outpatient Medications Prior to Visit  Medication Sig Dispense Refill  . BOTOX 200 units SOLR     . cholecalciferol (VITAMIN D) 1000 UNITS tablet Take 1,000 Units by mouth daily.    . cyclobenzaprine (FLEXERIL) 5 MG tablet TAKE 1 TABLET THREE TIMES DAILY AS NEEDED FOR MUSCLE SPASMS. 90 tablet 0  . estradiol (ESTRACE) 0.5 MG tablet Take 1 tablet (0.5 mg total) by mouth daily. 90 tablet 4  . ibuprofen (IBU) 600 MG tablet TAKE 1 TABLET EVERY 8 HOURS AS NEEDED FOR HEADACHE. 90 tablet 1  . Multiple Vitamin (MULTIVITAMIN PO) Take 1 tablet by mouth daily.    . ondansetron (ZOFRAN) 8 MG tablet TAKE 1 TABLET EVERY 8 HOURS AS NEEDED FOR NAUSEA AND VOMITING. 21 tablet 3  . Oxycodone HCl 10 MG TABS Take 1 tablet (10 mg total) by mouth 4 (four) times daily as needed. 120 tablet 0  . Oxycodone HCl 10 MG TABS Take 1 tablet (10 mg total) by mouth every 6 (six) hours as needed. 120 tablet 0  . pantoprazole (PROTONIX) 40 MG tablet TAKE 1 TABLET ONCE DAILY. 90 tablet 3  . polyethylene glycol (MIRALAX / GLYCOLAX) packet Take 8.5 g by mouth every other day.    . Rimegepant Sulfate (NURTEC) 75 MG TBDP Take 75 mg by mouth daily as needed. 16 tablet 5  . rizatriptan (MAXALT) 10 MG tablet TAKE 1 TABLET AT ONSET OF MIGRAINE- MAY REPEAT ONCE IN 2 HOURS. LIMIT 2/24 HOURS. AVOID DAILY USE. 8 tablet 3  . SUMAtriptan (IMITREX) 100 MG tablet TAKE 1 TABLET AT ONSET OF MIGRAINE- MAY REPEAT ONCE IN 2 HOURS. LIMIT 2/24 HOURS. AVOID DAILY USE. 9 tablet 3  . traZODone (DESYREL) 50 MG tablet Take 0.5-1 tablets (25-50 mg total) by mouth at bedtime as needed for sleep. 30 tablet 5  . verapamil (CALAN-SR) 120 MG CR tablet Take 1 tablet (120 mg total) by mouth at bedtime. (Patient taking differently: Take 60 mg by  mouth at bedtime. Pt taking 1/2 tabs) 90 tablet 3  . clotrimazole-betamethasone (LOTRISONE) cream Apply 1 application topically 2 (two) times daily. (Patient not taking: Reported on 01/24/2021) 45 g 1  . topiramate (TOPAMAX) 25 MG tablet TAKE ONE TABLET AT BEDTIME. (Patient not taking: Reported on 01/24/2021) 30 tablet 0  . Ubrogepant (UBRELVY) 100 MG TABS Take 100 mg by mouth daily as needed. (Patient not taking: Reported on 01/24/2021) 6 tablet 11   No facility-administered medications prior to visit.    ROS: Review of Systems  Constitutional: Positive for fatigue. Negative for activity change, appetite change, chills and unexpected weight change.  HENT: Negative for congestion, mouth sores and sinus pressure.   Eyes: Negative for visual disturbance.  Respiratory: Negative for cough and chest tightness.   Gastrointestinal: Negative for abdominal pain and nausea.  Genitourinary: Negative for difficulty urinating, frequency and vaginal pain.  Musculoskeletal: Positive for arthralgias, back pain and gait problem.  Skin: Positive for rash. Negative for pallor.  Neurological: Negative for dizziness, tremors, weakness, numbness and headaches.  Psychiatric/Behavioral: Negative for confusion, sleep disturbance and suicidal ideas.    Objective:  BP 112/82 (BP Location: Left Arm, Patient Position: Sitting, Cuff Size: Normal)   Pulse 68   Temp 98.3 F (36.8 C) (  Oral)   Wt 157 lb (71.2 kg)   SpO2 97%   BMI 26.13 kg/m   BP Readings from Last 3 Encounters:  01/24/21 112/82  12/19/20 111/74  07/31/20 (!) 150/108    Wt Readings from Last 3 Encounters:  01/24/21 157 lb (71.2 kg)  12/19/20 152 lb 9.6 oz (69.2 kg)  07/31/20 149 lb (67.6 kg)    Physical Exam Constitutional:      General: She is not in acute distress.    Appearance: She is well-developed.  HENT:     Head: Normocephalic.     Right Ear: External ear normal.     Left Ear: External ear normal.     Nose: Nose normal.  Eyes:      General:        Right eye: No discharge.        Left eye: No discharge.     Conjunctiva/sclera: Conjunctivae normal.     Pupils: Pupils are equal, round, and reactive to light.  Neck:     Thyroid: No thyromegaly.     Vascular: No JVD.     Trachea: No tracheal deviation.  Cardiovascular:     Rate and Rhythm: Normal rate and regular rhythm.     Heart sounds: Normal heart sounds.  Pulmonary:     Effort: No respiratory distress.     Breath sounds: No stridor. No wheezing.  Abdominal:     General: Bowel sounds are normal. There is no distension.     Palpations: Abdomen is soft. There is no mass.     Tenderness: There is no abdominal tenderness. There is no guarding or rebound.  Musculoskeletal:        General: Tenderness present.     Cervical back: Normal range of motion and neck supple.  Lymphadenopathy:     Cervical: No cervical adenopathy.  Skin:    Findings: Rash present. No erythema.  Neurological:     Mental Status: She is oriented to person, place, and time.     Cranial Nerves: No cranial nerve deficit.     Motor: No abnormal muscle tone.     Coordination: Coordination normal.     Deep Tendon Reflexes: Reflexes normal.  Psychiatric:        Behavior: Behavior normal.        Thought Content: Thought content normal.        Judgment: Judgment normal.    Feet w/painful L 1st MTP  Lab Results  Component Value Date   WBC 6.2 05/07/2020   HGB 13.1 05/07/2020   HCT 39.5 05/07/2020   PLT 193 05/07/2020   GLUCOSE 81 05/07/2020   CHOL 199 10/31/2019   TRIG 72.0 10/31/2019   HDL 64.60 10/31/2019   LDLDIRECT 130.2 12/21/2013   LDLCALC 120 (H) 10/31/2019   ALT 23 05/07/2020   AST 25 05/07/2020   NA 141 05/07/2020   K 4.0 05/07/2020   CL 104 05/07/2020   CREATININE 0.89 05/07/2020   BUN 18 05/07/2020   CO2 29 05/07/2020   TSH 2.43 05/07/2020   HGBA1C 5.5 03/31/2018    MM 3D SCREEN BREAST BILATERAL  Result Date: 04/25/2020 CLINICAL DATA:  Screening. EXAM:  DIGITAL SCREENING BILATERAL MAMMOGRAM WITH TOMO AND CAD COMPARISON:  Previous exam(s). ACR Breast Density Category c: The breast tissue is heterogeneously dense, which may obscure small masses. FINDINGS: There are no findings suspicious for malignancy. Images were processed with CAD. IMPRESSION: No mammographic evidence of malignancy. A result letter of this screening mammogram  will be mailed directly to the patient. RECOMMENDATION: Screening mammogram in one year. (Code:SM-B-01Y) BI-RADS CATEGORY  1: Negative. Electronically Signed   By: Abelardo Diesel M.D.   On: 04/25/2020 10:32    Assessment & Plan:   There are no diagnoses linked to this encounter.   No orders of the defined types were placed in this encounter.    Follow-up: No follow-ups on file.  Walker Kehr, MD

## 2021-01-24 NOTE — Patient Instructions (Signed)
Spenco inserts [place] Birkinstock

## 2021-01-24 NOTE — Assessment & Plan Note (Signed)
Doing better on Nurtec

## 2021-01-24 NOTE — Assessment & Plan Note (Signed)
Nizoral po and cream

## 2021-01-24 NOTE — Assessment & Plan Note (Signed)
Due to syringomyelia On Oxycodone Rx refilled  Potential benefits of a long term opioids use as well as potential risks (i.e. addiction risk, apnea etc) and complications (i.e. Somnolence, constipation and others) were explained to the patient and were aknowledged.

## 2021-01-28 ENCOUNTER — Other Ambulatory Visit: Payer: Self-pay | Admitting: Internal Medicine

## 2021-03-08 ENCOUNTER — Ambulatory Visit: Payer: PPO | Admitting: Neurology

## 2021-03-20 ENCOUNTER — Other Ambulatory Visit: Payer: Self-pay | Admitting: Internal Medicine

## 2021-04-20 ENCOUNTER — Other Ambulatory Visit: Payer: Self-pay | Admitting: Internal Medicine

## 2021-04-25 ENCOUNTER — Ambulatory Visit (INDEPENDENT_AMBULATORY_CARE_PROVIDER_SITE_OTHER): Payer: PPO | Admitting: Internal Medicine

## 2021-04-25 ENCOUNTER — Encounter: Payer: Self-pay | Admitting: Internal Medicine

## 2021-04-25 ENCOUNTER — Other Ambulatory Visit: Payer: Self-pay

## 2021-04-25 VITALS — BP 132/84 | HR 77 | Temp 98.0°F | Ht 65.0 in | Wt 162.0 lb

## 2021-04-25 DIAGNOSIS — G8929 Other chronic pain: Secondary | ICD-10-CM

## 2021-04-25 DIAGNOSIS — M5441 Lumbago with sciatica, right side: Secondary | ICD-10-CM

## 2021-04-25 DIAGNOSIS — K5903 Drug induced constipation: Secondary | ICD-10-CM | POA: Diagnosis not present

## 2021-04-25 DIAGNOSIS — Z0000 Encounter for general adult medical examination without abnormal findings: Secondary | ICD-10-CM

## 2021-04-25 DIAGNOSIS — G950 Syringomyelia and syringobulbia: Secondary | ICD-10-CM

## 2021-04-25 DIAGNOSIS — M5442 Lumbago with sciatica, left side: Secondary | ICD-10-CM

## 2021-04-25 DIAGNOSIS — G43719 Chronic migraine without aura, intractable, without status migrainosus: Secondary | ICD-10-CM

## 2021-04-25 DIAGNOSIS — E785 Hyperlipidemia, unspecified: Secondary | ICD-10-CM | POA: Diagnosis not present

## 2021-04-25 LAB — URINALYSIS
Bilirubin Urine: NEGATIVE
Hgb urine dipstick: NEGATIVE
Ketones, ur: NEGATIVE
Leukocytes,Ua: NEGATIVE
Nitrite: NEGATIVE
Specific Gravity, Urine: <=1.005 — AB (ref 1.000–1.030)
Total Protein, Urine: NEGATIVE
Urine Glucose: NEGATIVE
Urobilinogen, UA: 0.2 (ref 0.0–1.0)
pH: 6.5 (ref 5.0–8.0)

## 2021-04-25 LAB — COMPREHENSIVE METABOLIC PANEL
ALT: 27 U/L (ref 0–35)
AST: 25 U/L (ref 0–37)
Albumin: 4.4 g/dL (ref 3.5–5.2)
Alkaline Phosphatase: 100 U/L (ref 39–117)
BUN: 21 mg/dL (ref 6–23)
CO2: 29 mEq/L (ref 19–32)
Calcium: 9.7 mg/dL (ref 8.4–10.5)
Chloride: 102 mEq/L (ref 96–112)
Creatinine, Ser: 0.9 mg/dL (ref 0.40–1.20)
GFR: 69.18 mL/min (ref >60.00)
Glucose, Bld: 85 mg/dL (ref 70–99)
Potassium: 4.5 mEq/L (ref 3.5–5.1)
Sodium: 139 mEq/L (ref 135–145)
Total Bilirubin: 0.5 mg/dL (ref 0.2–1.2)
Total Protein: 7.2 g/dL (ref 6.0–8.3)

## 2021-04-25 LAB — CBC WITH DIFFERENTIAL/PLATELET
Basophils Absolute: 0 10*3/uL (ref 0.0–0.1)
Basophils Relative: 0.9 % (ref 0.0–3.0)
Eosinophils Absolute: 0.1 10*3/uL (ref 0.0–0.7)
Eosinophils Relative: 2.7 % (ref 0.0–5.0)
HCT: 39.6 % (ref 36.0–46.0)
Hemoglobin: 13.3 g/dL (ref 12.0–15.0)
Lymphocytes Relative: 33.9 % (ref 12.0–46.0)
Lymphs Abs: 1.8 10*3/uL (ref 0.7–4.0)
MCHC: 33.6 g/dL (ref 30.0–36.0)
MCV: 93.4 fl (ref 78.0–100.0)
Monocytes Absolute: 0.3 10*3/uL (ref 0.1–1.0)
Monocytes Relative: 5.4 % (ref 3.0–12.0)
Neutro Abs: 3 10*3/uL (ref 1.4–7.7)
Neutrophils Relative %: 57.1 % (ref 43.0–77.0)
Platelets: 210 10*3/uL (ref 150.0–400.0)
RBC: 4.23 Mil/uL (ref 3.87–5.11)
RDW: 12.6 % (ref 11.5–15.5)
WBC: 5.2 10*3/uL (ref 4.0–10.5)

## 2021-04-25 LAB — LIPID PANEL
Cholesterol: 225 mg/dL — ABNORMAL HIGH (ref 0–200)
HDL: 70 mg/dL (ref >39.00)
LDL Cholesterol: 133 mg/dL — ABNORMAL HIGH (ref 0–99)
NonHDL: 154.66
Total CHOL/HDL Ratio: 3
Triglycerides: 110 mg/dL (ref 0.0–149.0)
VLDL: 22 mg/dL (ref 0.0–40.0)

## 2021-04-25 LAB — TSH: TSH: 1.81 u[IU]/mL (ref 0.35–5.50)

## 2021-04-25 MED ORDER — LINACLOTIDE 290 MCG PO CAPS: 290.0000 ug | ORAL_CAPSULE | Freq: Every day | ORAL | 11 refills | Status: DC

## 2021-04-25 MED ORDER — OXYCODONE HCL 10 MG PO TABS: 10.0000 mg | ORAL_TABLET | Freq: Four times a day (QID) | ORAL | 0 refills | Status: DC | PRN

## 2021-04-25 MED ORDER — PANTOPRAZOLE SODIUM 40 MG PO TBEC: 40.0000 mg | DELAYED_RELEASE_TABLET | Freq: Every day | ORAL | 3 refills | Status: DC

## 2021-04-25 MED ORDER — OXYCODONE HCL 10 MG PO TABS
10.0000 mg | ORAL_TABLET | Freq: Four times a day (QID) | ORAL | 0 refills | Status: DC | PRN
Start: 2021-04-25 — End: 2021-07-31

## 2021-04-25 NOTE — Assessment & Plan Note (Signed)
Chronic pain.  Continue with pain control

## 2021-04-25 NOTE — Assessment & Plan Note (Signed)
Dyslipidemia Diet, exercise weight loss Patient declined statins in the past

## 2021-04-25 NOTE — Assessment & Plan Note (Signed)
Due to syringomyelia Continue with oxycodone  Potential benefits of a long term opioids use as well as potential risks (i.e. addiction risk, apnea etc) and complications (i.e. Somnolence, constipation and others) were explained to the patient and were aknowledged.

## 2021-04-25 NOTE — Assessment & Plan Note (Signed)
Worse.  Ordered.  Reviewed use related.  Start Linzess as needed

## 2021-04-25 NOTE — Progress Notes (Signed)
Subjective:  Patient ID: Beth Huffman, female    DOB: [birth year]  Age: 60 y.o. MRN: [id number]  CC: Follow-up (3 month f/u- Req refills on Oxycodone & Pantoprazole)   HPI Beth Huffman presents for chronic pain, HAs, stress at home.  Her mother is ill and currently is recovering from C. difficile colitis.  Baby A may need to care for her mother at her home. C/o constipation - worse  Outpatient Medications Prior to Visit  Medication Sig Dispense Refill   BOTOX 200 units SOLR      cholecalciferol (VITAMIN D) 1000 UNITS tablet Take 1,000 Units by mouth daily.     cyclobenzaprine (FLEXERIL) 5 MG tablet TAKE 1 TABLET THREE TIMES DAILY AS NEEDED FOR MUSCLE SPASMS. 90 tablet 0   estradiol (ESTRACE) 0.5 MG tablet Take 1 tablet (0.5 mg total) by mouth daily. 90 tablet 4   ibuprofen (IBU) 600 MG tablet TAKE 1 TABLET EVERY 8 HOURS AS NEEDED FOR HEADACHE. 90 tablet 1   ketoconazole (NIZORAL) 2 % cream Apply 1 application topically daily. 45 g 1   ketoconazole (NIZORAL) 200 MG tablet Take 1 tablet (200 mg total) by mouth daily. 14 tablet 1   Multiple Vitamin (MULTIVITAMIN PO) Take 1 tablet by mouth daily.     ondansetron (ZOFRAN) 8 MG tablet TAKE 1 TABLET EVERY 8 HOURS AS NEEDED FOR NAUSEA AND VOMITING. 21 tablet 3   Oxycodone HCl 10 MG TABS Take 1 tablet (10 mg total) by mouth 4 (four) times daily as needed. 120 tablet 0   Oxycodone HCl 10 MG TABS Take 1 tablet (10 mg total) by mouth every 6 (six) hours as needed. 120 tablet 0   Oxycodone HCl 10 MG TABS Take 1 tablet (10 mg total) by mouth every 6 (six) hours as needed. 120 tablet 0   pantoprazole (PROTONIX) 40 MG tablet TAKE 1 TABLET ONCE DAILY. 90 tablet 3   polyethylene glycol (MIRALAX / GLYCOLAX) packet Take 8.5 g by mouth every other day.     Rimegepant Sulfate (NURTEC) 75 MG TBDP Take 75 mg by mouth daily as needed. 16 tablet 5   rizatriptan (MAXALT) 10 MG tablet TAKE 1 TABLET AT ONSET OF MIGRAINE- MAY REPEAT ONCE IN 2 HOURS. LIMIT  2/24 HOURS. AVOID DAILY USE. 8 tablet 3   SUMAtriptan (IMITREX) 100 MG tablet TAKE 1 TABLET AT ONSET OF MIGRAINE- MAY REPEAT ONCE IN 2 HOURS. LIMIT 2/24 HOURS. AVOID DAILY USE. 9 tablet 3   topiramate (TOPAMAX) 25 MG tablet TAKE ONE TABLET AT BEDTIME. 30 tablet 0   traZODone (DESYREL) 50 MG tablet Take 0.5-1 tablets (25-50 mg total) by mouth at bedtime as needed for sleep. 30 tablet 5   verapamil (CALAN-SR) 120 MG CR tablet TAKE ONE TABLET AT BEDTIME. 90 tablet 3   clotrimazole-betamethasone (LOTRISONE) cream Apply 1 application topically 2 (two) times daily. (Patient not taking: Reported on 04/25/2021) 45 g 1   No facility-administered medications prior to visit.    ROS: Review of Systems  Constitutional:  Negative for activity change, appetite change, chills, fatigue and unexpected weight change.  HENT:  Negative for congestion, mouth sores and sinus pressure.   Eyes:  Negative for visual disturbance.  Respiratory:  Negative for cough and chest tightness.   Gastrointestinal:  Negative for abdominal pain and nausea.  Genitourinary:  Negative for difficulty urinating, frequency and vaginal pain.  Musculoskeletal:  Positive for back pain. Negative for gait problem.  Skin:  Negative for pallor and rash.  Neurological:  Positive for headaches. Negative for dizziness, tremors, weakness and numbness.  Psychiatric/Behavioral:  Negative for confusion, sleep disturbance and suicidal ideas.    Objective:  BP 132/84 (BP Location: Left Arm)   Pulse 77   Temp 98 F (36.7 C) (Oral)   Ht 5\' 5"  (1.651 m)   Wt 162 lb (73.5 kg)   SpO2 96%   BMI 26.96 kg/m   BP Readings from Last 3 Encounters:  04/25/21 132/84  01/24/21 112/82  12/19/20 111/74    Wt Readings from Last 3 Encounters:  04/25/21 162 lb (73.5 kg)  01/24/21 157 lb (71.2 kg)  12/19/20 152 lb 9.6 oz (69.2 kg)    Physical Exam Constitutional:      General: She is not in acute distress.    Appearance: She is well-developed.   HENT:     Head: Normocephalic.     Right Ear: External ear normal.     Left Ear: External ear normal.     Nose: Nose normal.  Eyes:     General:        Right eye: No discharge.        Left eye: No discharge.     Conjunctiva/sclera: Conjunctivae normal.     Pupils: Pupils are equal, round, and reactive to light.  Neck:     Thyroid: No thyromegaly.     Vascular: No JVD.     Trachea: No tracheal deviation.  Cardiovascular:     Rate and Rhythm: Normal rate and regular rhythm.     Heart sounds: Normal heart sounds.  Pulmonary:     Effort: No respiratory distress.     Breath sounds: No stridor. No wheezing.  Abdominal:     General: Bowel sounds are normal. There is no distension.     Palpations: Abdomen is soft. There is no mass.     Tenderness: There is no abdominal tenderness. There is no guarding or rebound.  Musculoskeletal:        General: Tenderness present.     Cervical back: Normal range of motion and neck supple. No rigidity.  Lymphadenopathy:     Cervical: No cervical adenopathy.  Skin:    Findings: No erythema or rash.  Neurological:     Cranial Nerves: No cranial nerve deficit.     Motor: No abnormal muscle tone.     Coordination: Coordination normal.     Deep Tendon Reflexes: Reflexes normal.  Psychiatric:        Behavior: Behavior normal.        Thought Content: Thought content normal.        Judgment: Judgment normal.    Lab Results  Component Value Date   WBC 6.2 05/07/2020   HGB 13.1 05/07/2020   HCT 39.5 05/07/2020   PLT 193 05/07/2020   GLUCOSE 81 05/07/2020   CHOL 199 10/31/2019   TRIG 72.0 10/31/2019   HDL 64.60 10/31/2019   LDLDIRECT 130.2 12/21/2013   LDLCALC 120 (H) 10/31/2019   ALT 23 05/07/2020   AST 25 05/07/2020   NA 141 05/07/2020   K 4.0 05/07/2020   CL 104 05/07/2020   CREATININE 0.89 05/07/2020   BUN 18 05/07/2020   CO2 29 05/07/2020   TSH 2.43 05/07/2020   HGBA1C 5.5 03/31/2018    MM 3D SCREEN BREAST BILATERAL  Result  Date: 04/25/2020 CLINICAL DATA:  Screening. EXAM: DIGITAL SCREENING BILATERAL MAMMOGRAM WITH TOMO AND CAD COMPARISON:  Previous exam(s). ACR Breast Density Category c: The breast tissue is  heterogeneously dense, which may obscure small masses. FINDINGS: There are no findings suspicious for malignancy. Images were processed with CAD. IMPRESSION: No mammographic evidence of malignancy. A result letter of this screening mammogram will be mailed directly to the patient. RECOMMENDATION: Screening mammogram in one year. (Code:SM-B-01Y) BI-RADS CATEGORY  1: Negative. Electronically Signed   By: Abelardo Diesel M.D.   On: 04/25/2020 10:32    Assessment & Plan:   Walker Kehr, MD

## 2021-04-25 NOTE — Assessment & Plan Note (Signed)
Continue with Botox injections - Dr Tomi Likens On verapamil, trazodone Maxalt as needed

## 2021-05-28 ENCOUNTER — Other Ambulatory Visit: Payer: Self-pay | Admitting: Internal Medicine

## 2021-06-08 ENCOUNTER — Other Ambulatory Visit: Payer: Self-pay | Admitting: Obstetrics & Gynecology

## 2021-06-14 ENCOUNTER — Ambulatory Visit: Payer: PPO | Admitting: Neurology

## 2021-06-17 ENCOUNTER — Telehealth: Payer: Self-pay | Admitting: Internal Medicine

## 2021-06-17 NOTE — Telephone Encounter (Signed)
Left message for patient to call me back at [phone] to schedule Medicare Annual Wellness Visit   No hx of AWV eligible as of 10/28/19  Please schedule at anytime with [facility] Advisor if patient calls the office back.    34 Minutes appointment   Any questions, please call me at [phone]

## 2021-06-22 ENCOUNTER — Other Ambulatory Visit: Payer: Self-pay | Admitting: Internal Medicine

## 2021-06-27 ENCOUNTER — Other Ambulatory Visit: Payer: Self-pay | Admitting: Internal Medicine

## 2021-07-25 ENCOUNTER — Other Ambulatory Visit: Payer: Self-pay | Admitting: Internal Medicine

## 2021-07-30 ENCOUNTER — Other Ambulatory Visit: Payer: Self-pay

## 2021-07-31 ENCOUNTER — Ambulatory Visit (INDEPENDENT_AMBULATORY_CARE_PROVIDER_SITE_OTHER): Payer: PPO | Admitting: Internal Medicine

## 2021-07-31 ENCOUNTER — Encounter: Payer: Self-pay | Admitting: Internal Medicine

## 2021-07-31 DIAGNOSIS — D485 Neoplasm of uncertain behavior of skin: Secondary | ICD-10-CM | POA: Diagnosis not present

## 2021-07-31 DIAGNOSIS — F4321 Adjustment disorder with depressed mood: Secondary | ICD-10-CM | POA: Diagnosis not present

## 2021-07-31 DIAGNOSIS — R635 Abnormal weight gain: Secondary | ICD-10-CM

## 2021-07-31 DIAGNOSIS — R202 Paresthesia of skin: Secondary | ICD-10-CM | POA: Diagnosis not present

## 2021-07-31 DIAGNOSIS — G950 Syringomyelia and syringobulbia: Secondary | ICD-10-CM | POA: Diagnosis not present

## 2021-07-31 DIAGNOSIS — M5442 Lumbago with sciatica, left side: Secondary | ICD-10-CM

## 2021-07-31 DIAGNOSIS — G8929 Other chronic pain: Secondary | ICD-10-CM | POA: Diagnosis not present

## 2021-07-31 DIAGNOSIS — Z23 Encounter for immunization: Secondary | ICD-10-CM | POA: Diagnosis not present

## 2021-07-31 DIAGNOSIS — M5441 Lumbago with sciatica, right side: Secondary | ICD-10-CM | POA: Diagnosis not present

## 2021-07-31 DIAGNOSIS — G43719 Chronic migraine without aura, intractable, without status migrainosus: Secondary | ICD-10-CM | POA: Diagnosis not present

## 2021-07-31 MED ORDER — OXYCODONE HCL 10 MG PO TABS: 10.0000 mg | ORAL_TABLET | Freq: Four times a day (QID) | ORAL | 0 refills | Status: DC | PRN

## 2021-07-31 MED ORDER — UBRELVY 100 MG PO TABS: 100.0000 mg | ORAL_TABLET | Freq: Every day | ORAL | 11 refills | Status: DC | PRN

## 2021-07-31 NOTE — Patient Instructions (Addendum)
Wrist splint   The Obesity Code book by Sharman Cheek   These suggestions will probably help you to improve your metabolism if you are not overweight and to lose weight if you are overweight: 1.  Reduce your consumption of sugars and starches.  Eliminate high fructose corn syrup from your diet.  Reduce your consumption of processed foods.  For desserts try to have seasonal fruits, berries, nuts, cheeses or dark chocolate with more than 70% cacao. 2.  Do not snack 3.  You do not have to eat breakfast.  If you choose to have breakfast - eat plain greek yogurt, eggs, oatmeal (without sugar) - use honey if you need to. 4.  Drink water, freshly brewed unsweetened tea (green, black or herbal) or coffee.  Do not drink sodas including diet sodas , juices, beverages sweetened with artificial sweeteners. 5.  Reduce your consumption of refined grains. 6.  Avoid protein drinks such as Optifast, Slim fast etc. Eat chicken, fish, meat, dairy and beans for your sources of protein. 7.  Natural unprocessed fats like cold pressed virgin olive oil, butter, coconut oil are good for you.  Eat avocados. 8.  Increase your consumption of fiber.  Fruits, berries, vegetables, whole grains, flaxseed, chia seeds, beans, popcorn, nuts, oatmeal are good sources of fiber 9.  Use vinegar in your diet, i.e. apple cider vinegar, red wine or balsamic vinegar 10.  You can try fasting.  For example you can skip breakfast and lunch every other day (24-hour fast) 11.  Stress reduction, good night sleep, relaxation, meditation, yoga and other physical activity is likely to help you to maintain low weight too. 12.  If you drink alcohol, limit your alcohol intake to no more than 2 drinks a day.

## 2021-07-31 NOTE — Progress Notes (Signed)
Subjective:  Patient ID: Beth Huffman, female    DOB: [birth year]  Age: 60 y.o. MRN: [id number]  CC: Annual Exam (Want to discuss chaging her Nurtec to [place])   HPI Beth Huffman presents for chronic back pain, R hand weakness, tingling, dropping things Grieving C/o HAs - worse C/o wt gain - 30 lbs C/o cyst  Outpatient Medications Prior to Visit  Medication Sig Dispense Refill   BOTOX 200 units SOLR      cholecalciferol (VITAMIN D) 1000 UNITS tablet Take 1,000 Units by mouth daily.     cyclobenzaprine (FLEXERIL) 5 MG tablet TAKE 1 TABLET THREE TIMES DAILY AS NEEDED FOR MUSCLE SPASMS. 90 tablet 0   estradiol (ESTRACE) 0.5 MG tablet Take 1 tablet (0.5 mg total) by mouth daily. 90 tablet 4   ibuprofen (IBU) 600 MG tablet TAKE 1 TABLET EVERY 8 HOURS AS NEEDED FOR HEADACHE. 90 tablet 1   ketoconazole (NIZORAL) 2 % cream Apply 1 application topically daily. 45 g 1   ketoconazole (NIZORAL) 200 MG tablet Take 1 tablet (200 mg total) by mouth daily. 14 tablet 1   linaclotide (LINZESS) 290 MCG CAPS capsule Take 1 capsule (290 mcg total) by mouth daily. 30 capsule 11   Multiple Vitamin (MULTIVITAMIN PO) Take 1 tablet by mouth daily.     ondansetron (ZOFRAN) 8 MG tablet TAKE 1 TABLET EVERY 8 HOURS AS NEEDED FOR NAUSEA AND VOMITING. 21 tablet 3   pantoprazole (PROTONIX) 40 MG tablet Take 1 tablet (40 mg total) by mouth daily. 90 tablet 3   polyethylene glycol (MIRALAX / GLYCOLAX) packet Take 8.5 g by mouth every other day.     Rimegepant Sulfate (NURTEC) 75 MG TBDP Take 75 mg by mouth daily as needed. 16 tablet 5   rizatriptan (MAXALT) 10 MG tablet TAKE 1 TABLET AT ONSET OF MIGRAINE- MAY REPEAT ONCE IN 2 HOURS. LIMIT 2/24 HOURS. AVOID DAILY USE. 8 tablet 3   SUMAtriptan (IMITREX) 100 MG tablet TAKE 1 TABLET AT ONSET OF MIGRAINE- MAY REPEAT ONCE IN 2 HOURS. LIMIT 2/24 HOURS. AVOID DAILY USE. 9 tablet 3   traZODone (DESYREL) 50 MG tablet Take 0.5-1 tablets (25-50 mg total) by mouth at  bedtime as needed for sleep. 30 tablet 5   verapamil (CALAN-SR) 120 MG CR tablet TAKE ONE TABLET AT BEDTIME. 90 tablet 3   Oxycodone HCl 10 MG TABS Take 1 tablet (10 mg total) by mouth every 6 (six) hours as needed. 120 tablet 0   topiramate (TOPAMAX) 25 MG tablet TAKE ONE TABLET AT BEDTIME. 30 tablet 0   Oxycodone HCl 10 MG TABS Take 1 tablet (10 mg total) by mouth 4 (four) times daily as needed. 120 tablet 0   Oxycodone HCl 10 MG TABS Take 1 tablet (10 mg total) by mouth every 6 (six) hours as needed. 120 tablet 0   No facility-administered medications prior to visit.    ROS: Review of Systems  Constitutional:  Negative for activity change, appetite change, chills, fatigue and unexpected weight change.  HENT:  Negative for congestion, mouth sores and sinus pressure.   Eyes:  Negative for visual disturbance.  Respiratory:  Negative for cough and chest tightness.   Gastrointestinal:  Negative for abdominal pain and nausea.  Genitourinary:  Negative for difficulty urinating, frequency and vaginal pain.  Musculoskeletal:  Positive for arthralgias, back pain and neck pain. Negative for gait problem.  Skin:  Negative for pallor and rash.  Neurological:  Positive for weakness and numbness.  Negative for dizziness, tremors and headaches.  Psychiatric/Behavioral:  Negative for confusion, sleep disturbance and suicidal ideas. The patient is nervous/anxious.    Objective:  BP 120/80 (BP Location: Left Arm)   Pulse 81   Temp 98.3 F (36.8 C) (Oral)   Ht 5\' 5"  (1.651 m)   Wt 168 lb (76.2 kg)   SpO2 97%   BMI 27.96 kg/m   BP Readings from Last 3 Encounters:  07/31/21 120/80  04/25/21 132/84  01/24/21 112/82    Wt Readings from Last 3 Encounters:  07/31/21 168 lb (76.2 kg)  04/25/21 162 lb (73.5 kg)  01/24/21 157 lb (71.2 kg)    Physical Exam Constitutional:      General: She is not in acute distress.    Appearance: She is well-developed.  HENT:     Head: Normocephalic.      Right Ear: External ear normal.     Left Ear: External ear normal.     Nose: Nose normal.  Eyes:     General:        Right eye: No discharge.        Left eye: No discharge.     Conjunctiva/sclera: Conjunctivae normal.     Pupils: Pupils are equal, round, and reactive to light.  Neck:     Thyroid: No thyromegaly.     Vascular: No JVD.     Trachea: No tracheal deviation.  Cardiovascular:     Rate and Rhythm: Normal rate and regular rhythm.     Heart sounds: Normal heart sounds.  Pulmonary:     Effort: No respiratory distress.     Breath sounds: No stridor. No wheezing.  Abdominal:     General: Bowel sounds are normal. There is no distension.     Palpations: Abdomen is soft. There is no mass.     Tenderness: There is no abdominal tenderness. There is no guarding or rebound.  Musculoskeletal:        General: Tenderness present.     Cervical back: Normal range of motion and neck supple. No rigidity.  Lymphadenopathy:     Cervical: No cervical adenopathy.  Skin:    Findings: No erythema or rash.  Neurological:     Mental Status: She is oriented to person, place, and time.     Cranial Nerves: No cranial nerve deficit.     Motor: No abnormal muscle tone.     Coordination: Coordination normal.     Deep Tendon Reflexes: Reflexes normal.  Psychiatric:        Behavior: Behavior normal.        Thought Content: Thought content normal.        Judgment: Judgment normal.  6 mm cyst R upper chest CTS signs (+) R hand  Lab Results  Component Value Date   WBC 5.2 04/25/2021   HGB 13.3 04/25/2021   HCT 39.6 04/25/2021   PLT 210.0 04/25/2021   GLUCOSE 85 04/25/2021   CHOL 225 (H) 04/25/2021   TRIG 110.0 04/25/2021   HDL 70.00 04/25/2021   LDLDIRECT 130.2 12/21/2013   LDLCALC 133 (H) 04/25/2021   ALT 27 04/25/2021   AST 25 04/25/2021   NA 139 04/25/2021   K 4.5 04/25/2021   CL 102 04/25/2021   CREATININE 0.90 04/25/2021   BUN 21 04/25/2021   CO2 29 04/25/2021   TSH 1.81  04/25/2021   HGBA1C 5.5 03/31/2018    MM 3D SCREEN BREAST BILATERAL  Result Date: 04/25/2020 CLINICAL DATA:  Screening. EXAM:  DIGITAL SCREENING BILATERAL MAMMOGRAM WITH TOMO AND CAD COMPARISON:  Previous exam(s). ACR Breast Density Category c: The breast tissue is heterogeneously dense, which may obscure small masses. FINDINGS: There are no findings suspicious for malignancy. Images were processed with CAD. IMPRESSION: No mammographic evidence of malignancy. A result letter of this screening mammogram will be mailed directly to the patient. RECOMMENDATION: Screening mammogram in one year. (Code:SM-B-01Y) BI-RADS CATEGORY  1: Negative. Electronically Signed   By: Abelardo Diesel M.D.   On: 04/25/2020 10:32    Assessment & Plan:   Problem List Items Addressed This Visit     Migraine, chronic, without aura, intractable (Chronic)    Worse Nurtec was $300/mo Alfonse Alpers is less $$$ Maxalt prn      Relevant Medications   Ubrogepant (UBRELVY) 100 MG TABS   Oxycodone HCl 10 MG TABS   Oxycodone HCl 10 MG TABS   Oxycodone HCl 10 MG TABS   Grief    Mom died - summer 2022 Discussed      LOW BACK PAIN    On Oxycodone  Potential benefits of a long term opioids use as well as potential risks (i.e. addiction risk, apnea etc) and complications (i.e. Somnolence, constipation and others) were explained to the patient and were aknowledged.      Relevant Medications   Oxycodone HCl 10 MG TABS   Oxycodone HCl 10 MG TABS   Oxycodone HCl 10 MG TABS   SYRINGOMYELIA    Possibly worsening pain F/u w/Dr Tomi Likens      Weight gain    Worse Low carb diet, fasting         Follow-up: Return in about 3 months (around 10/31/2021) for a follow-up visit.  Walker Kehr, MD

## 2021-07-31 NOTE — Assessment & Plan Note (Signed)
Mom died - summer 2022 Discussed

## 2021-07-31 NOTE — Assessment & Plan Note (Signed)
R chest wall cyst Derm ref

## 2021-07-31 NOTE — Assessment & Plan Note (Signed)
Possibly worsening pain F/u w/Dr Tomi Likens

## 2021-07-31 NOTE — Assessment & Plan Note (Signed)
R hand - ?CTS vs other Wrist splint

## 2021-07-31 NOTE — Assessment & Plan Note (Signed)
Worse Nurtec was $300/mo Ubrelvi is less $$$ Maxalt prn

## 2021-07-31 NOTE — Assessment & Plan Note (Signed)
Worse Low carb diet, fasting

## 2021-07-31 NOTE — Assessment & Plan Note (Signed)
On Oxycodone  Potential benefits of a long term opioids use as well as potential risks (i.e. addiction risk, apnea etc) and complications (i.e. Somnolence, constipation and others) were explained to the patient and were aknowledged.  

## 2021-08-08 ENCOUNTER — Telehealth: Payer: Self-pay

## 2021-08-08 NOTE — Telephone Encounter (Signed)
PA started for Beth Huffman  (Key: [id number])

## 2021-08-09 ENCOUNTER — Other Ambulatory Visit: Payer: Self-pay | Admitting: Obstetrics & Gynecology

## 2021-08-09 DIAGNOSIS — Z1231 Encounter for screening mammogram for malignant neoplasm of breast: Secondary | ICD-10-CM

## 2021-08-09 NOTE — Telephone Encounter (Signed)
Pt called ? About PA. She states per pharmacy she could get med for less than $100. Inform per 1st PA stated they made seversl  # attempt to contact prescriber for clinical questions. Resubmitted via cover-my-med w/ Key#(Key: [id number])...Johny Chess

## 2021-08-12 NOTE — Telephone Encounter (Signed)
Rec'd PA determination med has been approved w/ approval dates 08/08/21-08/08/22. Faxed approval to pof.Marland KitchenJohny Chess

## 2021-08-13 MED ORDER — UBRELVY 100 MG PO TABS: 100.0000 mg | ORAL_TABLET | Freq: Every day | ORAL | 0 refills | Status: DC | PRN

## 2021-08-13 NOTE — Telephone Encounter (Signed)
Notified pt of ubrelvy samples 2 boxes Lot # [id number] exp 04/2022.Marland KitchenJohny Huffman

## 2021-08-20 ENCOUNTER — Other Ambulatory Visit: Payer: Self-pay | Admitting: Internal Medicine

## 2021-08-23 ENCOUNTER — Ambulatory Visit: Payer: PPO | Admitting: Neurology

## 2021-08-29 ENCOUNTER — Encounter: Payer: Self-pay | Admitting: Internal Medicine

## 2021-08-29 ENCOUNTER — Other Ambulatory Visit: Payer: Self-pay

## 2021-08-29 ENCOUNTER — Ambulatory Visit (INDEPENDENT_AMBULATORY_CARE_PROVIDER_SITE_OTHER): Payer: PPO | Admitting: Internal Medicine

## 2021-08-29 DIAGNOSIS — G43719 Chronic migraine without aura, intractable, without status migrainosus: Secondary | ICD-10-CM | POA: Diagnosis not present

## 2021-08-29 DIAGNOSIS — R635 Abnormal weight gain: Secondary | ICD-10-CM

## 2021-08-29 DIAGNOSIS — G8929 Other chronic pain: Secondary | ICD-10-CM

## 2021-08-29 DIAGNOSIS — M5442 Lumbago with sciatica, left side: Secondary | ICD-10-CM

## 2021-08-29 DIAGNOSIS — M5441 Lumbago with sciatica, right side: Secondary | ICD-10-CM

## 2021-08-29 MED ORDER — PHENTERMINE HCL 37.5 MG PO TABS: 37.5000 mg | ORAL_TABLET | Freq: Every day | ORAL | 2 refills | Status: DC

## 2021-08-29 MED ORDER — ESTRADIOL 0.5 MG PO TABS: 0.5000 mg | ORAL_TABLET | Freq: Every day | ORAL | 0 refills | Status: DC

## 2021-08-29 NOTE — Assessment & Plan Note (Addendum)
Refractory Re-start Estrace Wt loss - try Adipex to assist

## 2021-08-29 NOTE — Progress Notes (Signed)
Subjective:  Patient ID: Beth Huffman, female    DOB: Feb 14, 1961  Age: 60 y.o. MRN: [id number]  CC: Weight Gain (/) and Headache   HPI ALIYANNA WASSMER presents for hot flashes - ran out of estrogens 6 months ago, Mammo/PAP - pending C/o wt gain - 30 lbs in 6 months  Outpatient Medications Prior to Visit  Medication Sig Dispense Refill   BOTOX 200 units SOLR      cholecalciferol (VITAMIN D) 1000 UNITS tablet Take 1,000 Units by mouth daily.     cyclobenzaprine (FLEXERIL) 5 MG tablet TAKE 1 TABLET THREE TIMES DAILY AS NEEDED FOR MUSCLE SPASMS. 90 tablet 3   ibuprofen (IBU) 600 MG tablet TAKE 1 TABLET EVERY 8 HOURS AS NEEDED FOR HEADACHE. 90 tablet 1   ketoconazole (NIZORAL) 2 % cream Apply 1 application topically daily. 45 g 1   linaclotide (LINZESS) 290 MCG CAPS capsule Take 1 capsule (290 mcg total) by mouth daily. 30 capsule 11   Multiple Vitamin (MULTIVITAMIN PO) Take 1 tablet by mouth daily.     naproxen (NAPROSYN) 500 MG tablet TAKE 1 TABLET BY MOUTH TWICE DAILY WITH A MEAL. 60 tablet 3   ondansetron (ZOFRAN) 8 MG tablet TAKE ONE TABLET BY MOUTH EVERY 8 HOURS AS NEEDED FOR NAUSEA OR FOR VOMITING 21 tablet 3   Oxycodone HCl 10 MG TABS Take 1 tablet (10 mg total) by mouth every 6 (six) hours as needed. 120 tablet 0   pantoprazole (PROTONIX) 40 MG tablet Take 1 tablet (40 mg total) by mouth daily. 90 tablet 3   polyethylene glycol (MIRALAX / GLYCOLAX) packet Take 8.5 g by mouth every other day.     Rimegepant Sulfate (NURTEC) 75 MG TBDP Take 75 mg by mouth daily as needed. 16 tablet 5   rizatriptan (MAXALT) 10 MG tablet TAKE 1 TABLET AT ONSET OF MIGRAINE- MAY REPEAT ONCE IN 2 HOURS. LIMIT 2/24 HOURS. AVOID DAILY USE. 8 tablet 3   SUMAtriptan (IMITREX) 100 MG tablet TAKE 1 TABLET AT ONSET OF MIGRAINE- MAY REPEAT ONCE IN 2 HOURS. LIMIT 2/24 HOURS. AVOID DAILY USE. 9 tablet 3   traZODone (DESYREL) 50 MG tablet Take 0.5-1 tablets (25-50 mg total) by mouth at bedtime as needed for  sleep. 30 tablet 5   Ubrogepant (UBRELVY) 100 MG TABS Take 100 mg by mouth daily as needed. 2 tablet 0   verapamil (CALAN-SR) 120 MG CR tablet TAKE ONE TABLET AT BEDTIME. 90 tablet 3   ketoconazole (NIZORAL) 200 MG tablet Take 1 tablet (200 mg total) by mouth daily. 14 tablet 1   Oxycodone HCl 10 MG TABS Take 1 tablet (10 mg total) by mouth 4 (four) times daily as needed. 120 tablet 0   Oxycodone HCl 10 MG TABS Take 1 tablet (10 mg total) by mouth every 6 (six) hours as needed. 120 tablet 0   estradiol (ESTRACE) 0.5 MG tablet Take 1 tablet (0.5 mg total) by mouth daily. (Patient not taking: Reported on 08/29/2021) 90 tablet 4   No facility-administered medications prior to visit.    ROS: Review of Systems  Constitutional:  Positive for diaphoresis and unexpected weight change. Negative for activity change, appetite change, chills and fatigue.  HENT:  Negative for congestion, mouth sores and sinus pressure.   Eyes:  Negative for visual disturbance.  Respiratory:  Negative for cough and chest tightness.   Gastrointestinal:  Negative for abdominal pain and nausea.  Genitourinary:  Negative for difficulty urinating, frequency and vaginal pain.  Musculoskeletal:  Positive for back pain and neck stiffness. Negative for gait problem.  Skin:  Negative for pallor and rash.  Neurological:  Negative for dizziness, tremors, weakness, numbness and headaches.  Psychiatric/Behavioral:  Negative for confusion and sleep disturbance.    Objective:  BP 138/84 (BP Location: Left Arm)   Pulse 78   Temp 98.1 F (36.7 C) (Oral)   Ht 5\' 5"  (1.651 m)   Wt 170 lb (77.1 kg)   SpO2 97%   BMI 28.29 kg/m   BP Readings from Last 3 Encounters:  08/29/21 138/84  07/31/21 120/80  04/25/21 132/84    Wt Readings from Last 3 Encounters:  08/29/21 170 lb (77.1 kg)  07/31/21 168 lb (76.2 kg)  04/25/21 162 lb (73.5 kg)    Physical Exam Constitutional:      General: She is not in acute distress.     Appearance: She is well-developed.  HENT:     Head: Normocephalic.     Right Ear: External ear normal.     Left Ear: External ear normal.     Nose: Nose normal.  Eyes:     General:        Right eye: No discharge.        Left eye: No discharge.     Conjunctiva/sclera: Conjunctivae normal.     Pupils: Pupils are equal, round, and reactive to light.  Neck:     Thyroid: No thyromegaly.     Vascular: No JVD.     Trachea: No tracheal deviation.  Cardiovascular:     Rate and Rhythm: Normal rate and regular rhythm.     Heart sounds: Normal heart sounds.  Pulmonary:     Effort: No respiratory distress.     Breath sounds: No stridor. No wheezing.  Abdominal:     General: Bowel sounds are normal. There is no distension.     Palpations: Abdomen is soft. There is no mass.     Tenderness: There is abdominal tenderness. There is no guarding or rebound.  Musculoskeletal:        General: No tenderness.     Cervical back: Normal range of motion and neck supple. No rigidity.  Lymphadenopathy:     Cervical: No cervical adenopathy.  Skin:    Findings: No erythema or rash.  Neurological:     Cranial Nerves: No cranial nerve deficit.     Motor: No abnormal muscle tone.     Coordination: Coordination normal.     Deep Tendon Reflexes: Reflexes normal.  Psychiatric:        Behavior: Behavior normal.        Thought Content: Thought content normal.        Judgment: Judgment normal.    Lab Results  Component Value Date   WBC 5.2 04/25/2021   HGB 13.3 04/25/2021   HCT 39.6 04/25/2021   PLT 210.0 04/25/2021   GLUCOSE 85 04/25/2021   CHOL 225 (H) 04/25/2021   TRIG 110.0 04/25/2021   HDL 70.00 04/25/2021   LDLDIRECT 130.2 12/21/2013   LDLCALC 133 (H) 04/25/2021   ALT 27 04/25/2021   AST 25 04/25/2021   NA 139 04/25/2021   K 4.5 04/25/2021   CL 102 04/25/2021   CREATININE 0.90 04/25/2021   BUN 21 04/25/2021   CO2 29 04/25/2021   TSH 1.81 04/25/2021   HGBA1C 5.5 03/31/2018    MM 3D  SCREEN BREAST BILATERAL  Result Date: 04/25/2020 CLINICAL DATA:  Screening. EXAM: DIGITAL SCREENING BILATERAL MAMMOGRAM WITH  TOMO AND CAD COMPARISON:  Previous exam(s). ACR Breast Density Category c: The breast tissue is heterogeneously dense, which may obscure small masses. FINDINGS: There are no findings suspicious for malignancy. Images were processed with CAD. IMPRESSION: No mammographic evidence of malignancy. A result letter of this screening mammogram will be mailed directly to the patient. RECOMMENDATION: Screening mammogram in one year. (Code:SM-B-01Y) BI-RADS CATEGORY  1: Negative. Electronically Signed   By: Abelardo Diesel M.D.   On: 04/25/2020 10:32    Assessment & Plan:   Problem List Items Addressed This Visit     Migraine, chronic, without aura, intractable (Chronic)    Refractory Re-start Estrace Wt loss - try Adipex to assist      LOW BACK PAIN    On Oxycodone  Potential benefits of a long term opioids use as well as potential risks (i.e. addiction risk, apnea etc) and complications (i.e. Somnolence, constipation and others) were explained to the patient and were aknowledged.      Weight gain    Worse Low carb diet, fasting Start Adipex po, Estrace  Potential benefits of a long term adipex use as well as potential risks  and complications were explained to the patient and were aknowledged.          Meds ordered this encounter  Medications   estradiol (ESTRACE) 0.5 MG tablet    Sig: Take 1 tablet (0.5 mg total) by mouth daily.    Dispense:  90 tablet    Refill:  0    This is to replace the Est Patch that was too expensive. thanks   phentermine (ADIPEX-P) 37.5 MG tablet    Sig: Take 1 tablet (37.5 mg total) by mouth daily before breakfast.    Dispense:  30 tablet    Refill:  2       Follow-up: Return in about 3 months (around 11/29/2021).  Walker Kehr, MD

## 2021-08-29 NOTE — Assessment & Plan Note (Signed)
On Oxycodone  Potential benefits of a long term opioids use as well as potential risks (i.e. addiction risk, apnea etc) and complications (i.e. Somnolence, constipation and others) were explained to the patient and were aknowledged.  

## 2021-08-29 NOTE — Assessment & Plan Note (Addendum)
Worse Low carb diet, fasting Start Adipex po, Estrace  Potential benefits of a long term adipex use as well as potential risks  and complications were explained to the patient and were aknowledged.

## 2021-09-09 ENCOUNTER — Ambulatory Visit
Admission: RE | Admit: 2021-09-09 | Discharge: 2021-09-09 | Disposition: A | Discharge status: Home / Self Care | Payer: PPO | Source: Ambulatory Visit | Attending: Obstetrics & Gynecology | Admitting: Obstetrics & Gynecology

## 2021-09-09 DIAGNOSIS — Z1231 Encounter for screening mammogram for malignant neoplasm of breast: Secondary | ICD-10-CM

## 2021-09-10 DIAGNOSIS — L570 Actinic keratosis: Secondary | ICD-10-CM | POA: Diagnosis not present

## 2021-09-10 DIAGNOSIS — Z884 Allergy status to anesthetic agent status: Secondary | ICD-10-CM | POA: Diagnosis not present

## 2021-09-10 DIAGNOSIS — L814 Other melanin hyperpigmentation: Secondary | ICD-10-CM | POA: Diagnosis not present

## 2021-09-10 DIAGNOSIS — D171 Benign lipomatous neoplasm of skin and subcutaneous tissue of trunk: Secondary | ICD-10-CM | POA: Diagnosis not present

## 2021-09-10 DIAGNOSIS — D225 Melanocytic nevi of trunk: Secondary | ICD-10-CM | POA: Diagnosis not present

## 2021-09-13 ENCOUNTER — Ambulatory Visit: Payer: PPO | Admitting: Neurology

## 2021-09-13 ENCOUNTER — Other Ambulatory Visit: Payer: Self-pay

## 2021-09-13 DIAGNOSIS — G43709 Chronic migraine without aura, not intractable, without status migrainosus: Secondary | ICD-10-CM

## 2021-09-13 NOTE — Progress Notes (Signed)
[facility]   Procedure Note Botox  Attending: Dr. Metta Clines  Preoperative Diagnosis(es): Chronic migraine  Consent obtained from: The patient Benefits discussed included, but were not limited to decreased muscle tightness, increased joint range of motion, and decreased pain.  Risk discussed included, but were not limited pain and discomfort, bleeding, bruising, excessive weakness, venous thrombosis, muscle atrophy and dysphagia.  Anticipated outcomes of the procedure as well as he risks and benefits of the alternatives to the procedure, and the roles and tasks of the personnel to be involved, were discussed with the patient, and the patient consents to the procedure and agrees to proceed. A copy of the patient medication guide was given to the patient which explains the blackbox warning.  Patients identity and treatment sites confirmed Yes.  .  Details of Procedure: Skin was cleaned with alcohol. Prior to injection, the needle plunger was aspirated to make sure the needle was not within a blood vessel.  There was no blood retrieved on aspiration.    Following is a summary of the muscles injected  And the amount of Botulinum toxin used:  Dilution 200 units of Botox was reconstituted with 4 ml of preservative free normal saline. Time of reconstitution: At the time of the office visit (<30 minutes prior to injection)   Injections  155 total units of Botox was injected with a 30 gauge needle.  Injection Sites: L occipitalis: 15 units- 3 sites  R occiptalis: 15 units- 3 sites  L upper trapezius: 15 units- 3 sites R upper trapezius: 15 units- 3 sits          L paraspinal: 10 units- 2 sites R paraspinal: 10 units- 2 sites  Face L frontalis(2 injection sites):10 units   R frontalis(2 injection sites):10 units         L corrugator: 5 units   R corrugator: 5 units           Procerus: 5 units   L temporalis: 20 units R temporalis: 20 units   Agent:  200 units of botulinum Type  A (Onobotulinum Toxin type A) was reconstituted with 4 ml of preservative free normal saline.  Time of reconstitution: At the time of the office visit (<30 minutes prior to injection)     Total injected (Units):  155  Total wasted (Units): none wasted  Patient tolerated procedure well without complications.   Reinjection is anticipated in 3 months.

## 2021-10-01 ENCOUNTER — Telehealth: Payer: PPO | Admitting: Family Medicine

## 2021-10-01 DIAGNOSIS — M1712 Unilateral primary osteoarthritis, left knee: Secondary | ICD-10-CM

## 2021-10-01 NOTE — Progress Notes (Signed)
[place]  Needs to be seen for referral to Ortho for knee-

## 2021-10-10 ENCOUNTER — Telehealth: Payer: Self-pay | Admitting: Internal Medicine

## 2021-10-10 DIAGNOSIS — M79673 Pain in unspecified foot: Secondary | ICD-10-CM

## 2021-10-10 NOTE — Telephone Encounter (Signed)
Patient states she is having pain in her right foot and toes  Patient is requesting a referral to a podiatrist  Patient's next ov is 11-04-2021  Patient is requesting a call back to discuss symptoms and referral request

## 2021-10-13 NOTE — Telephone Encounter (Signed)
Okay. Thank you.

## 2021-10-14 NOTE — Telephone Encounter (Signed)
Called pt there was no answer LMOM w/MD response../lmb 

## 2021-10-25 ENCOUNTER — Other Ambulatory Visit: Payer: Self-pay | Admitting: Internal Medicine

## 2021-10-25 ENCOUNTER — Encounter: Payer: Self-pay | Admitting: Internal Medicine

## 2021-10-26 ENCOUNTER — Other Ambulatory Visit: Payer: Self-pay | Admitting: Internal Medicine

## 2021-10-26 ENCOUNTER — Encounter: Payer: Self-pay | Admitting: Internal Medicine

## 2021-10-26 MED ORDER — OXYCODONE HCL 10 MG PO TABS: 10.0000 mg | ORAL_TABLET | Freq: Four times a day (QID) | ORAL | 0 refills | Status: DC | PRN

## 2021-10-27 HISTORY — PX: FOOT SURGERY: SHX648

## 2021-10-28 ENCOUNTER — Encounter: Payer: Self-pay | Admitting: Internal Medicine

## 2021-10-29 ENCOUNTER — Telehealth: Payer: Self-pay

## 2021-10-29 NOTE — Telephone Encounter (Signed)
New message   [facility] (Key: [id number]) Nurtec 75MG  dispersible tablets   Form [employer] 4-Part Electronic PA Form (2017 NCPDP) Created 1 day ago Sent to Plan 1 minute ago Determination Wait for Questions [employer] NCPDP 2017 typically responds with questions in less than 15 minutes, but may take up to 24 hours.   [employer] is processing your PA request and will respond shortly with next steps. You may close this dialog, return to your dashboard, and perform other tasks. To check for an update later, open this request again from your dashboard.  If you need assistance, please chat with CoverMyMeds or call us at [phone].

## 2021-10-29 NOTE — Telephone Encounter (Signed)
FYI.Marland KitchenMarland KitchenMarland KitchenPls see email...Johny Chess

## 2021-10-30 ENCOUNTER — Ambulatory Visit (INDEPENDENT_AMBULATORY_CARE_PROVIDER_SITE_OTHER): Payer: PPO | Admitting: Internal Medicine

## 2021-10-30 ENCOUNTER — Encounter: Payer: Self-pay | Admitting: Internal Medicine

## 2021-10-30 ENCOUNTER — Other Ambulatory Visit: Payer: Self-pay

## 2021-10-30 ENCOUNTER — Other Ambulatory Visit: Payer: Self-pay | Admitting: Internal Medicine

## 2021-10-30 DIAGNOSIS — M5441 Lumbago with sciatica, right side: Secondary | ICD-10-CM

## 2021-10-30 DIAGNOSIS — R739 Hyperglycemia, unspecified: Secondary | ICD-10-CM | POA: Diagnosis not present

## 2021-10-30 DIAGNOSIS — G43719 Chronic migraine without aura, intractable, without status migrainosus: Secondary | ICD-10-CM

## 2021-10-30 DIAGNOSIS — G8929 Other chronic pain: Secondary | ICD-10-CM

## 2021-10-30 DIAGNOSIS — G950 Syringomyelia and syringobulbia: Secondary | ICD-10-CM | POA: Diagnosis not present

## 2021-10-30 DIAGNOSIS — Z780 Asymptomatic menopausal state: Secondary | ICD-10-CM | POA: Diagnosis not present

## 2021-10-30 DIAGNOSIS — M5442 Lumbago with sciatica, left side: Secondary | ICD-10-CM | POA: Diagnosis not present

## 2021-10-30 DIAGNOSIS — B351 Tinea unguium: Secondary | ICD-10-CM

## 2021-10-30 MED ORDER — OXYCODONE HCL 10 MG PO TABS: 10.0000 mg | ORAL_TABLET | Freq: Four times a day (QID) | ORAL | 0 refills | Status: DC | PRN

## 2021-10-30 MED ORDER — ESTRADIOL 0.5 MG PO TABS: 0.5000 mg | ORAL_TABLET | Freq: Every day | ORAL | 0 refills | Status: DC

## 2021-10-30 MED ORDER — PANTOPRAZOLE SODIUM 40 MG PO TBEC: 40.0000 mg | DELAYED_RELEASE_TABLET | Freq: Every day | ORAL | 3 refills | Status: DC

## 2021-10-30 MED ORDER — FAMOTIDINE 40 MG PO TABS: 40.0000 mg | ORAL_TABLET | Freq: Every day | ORAL | 3 refills | Status: DC

## 2021-10-30 NOTE — Assessment & Plan Note (Signed)
Pt lost wt Cont w/Phentermine

## 2021-10-30 NOTE — Assessment & Plan Note (Signed)
[facility] appt

## 2021-10-30 NOTE — Assessment & Plan Note (Signed)
On Oxycodone  Potential benefits of a long term opioids use as well as potential risks (i.e. addiction risk, apnea etc) and complications (i.e. Somnolence, constipation and others) were explained to the patient and were aknowledged.  

## 2021-10-30 NOTE — Assessment & Plan Note (Signed)
F/u w/Dr Tomi Likens Chronic pain

## 2021-10-30 NOTE — Progress Notes (Signed)
Subjective:  Patient ID: Beth Huffman, female    DOB: 08/14/1961  Age: 61 y.o. MRN: [id number]  CC: Foot Pain ((R) Foot along w/ knee pain)   HPI Beth Huffman presents for chronic pain, HAs, menopause, obesity  Outpatient Medications Prior to Visit  Medication Sig Dispense Refill   BOTOX 200 units SOLR      cholecalciferol (VITAMIN D) 1000 UNITS tablet Take 1,000 Units by mouth daily.     cyclobenzaprine (FLEXERIL) 5 MG tablet TAKE 1 TABLET THREE TIMES DAILY AS NEEDED FOR MUSCLE SPASMS. 90 tablet 3   ibuprofen (IBU) 600 MG tablet TAKE 1 TABLET EVERY 8 HOURS AS NEEDED FOR HEADACHE. 90 tablet 1   Multiple Vitamin (MULTIVITAMIN PO) Take 1 tablet by mouth daily.     naproxen (NAPROSYN) 500 MG tablet TAKE 1 TABLET BY MOUTH TWICE DAILY WITH A MEAL. 60 tablet 3   ondansetron (ZOFRAN) 8 MG tablet TAKE ONE TABLET BY MOUTH EVERY 8 HOURS AS NEEDED FOR NAUSEA OR FOR VOMITING 21 tablet 3   polyethylene glycol (MIRALAX / GLYCOLAX) packet Take 8.5 g by mouth every other day.     rizatriptan (MAXALT) 10 MG tablet TAKE 1 TABLET AT ONSET OF MIGRAINE- MAY REPEAT ONCE IN 2 HOURS. LIMIT 2/24 HOURS. AVOID DAILY USE. 8 tablet 3   SUMAtriptan (IMITREX) 100 MG tablet TAKE 1 TABLET AT ONSET OF MIGRAINE- MAY REPEAT ONCE IN 2 HOURS. LIMIT 2/24 HOURS. AVOID DAILY USE. 9 tablet 3   verapamil (CALAN-SR) 120 MG CR tablet TAKE ONE TABLET AT BEDTIME. 90 tablet 3   estradiol (ESTRACE) 0.5 MG tablet Take 1 tablet (0.5 mg total) by mouth daily. 90 tablet 0   Oxycodone HCl 10 MG TABS Take 1 tablet (10 mg total) by mouth every 6 (six) hours as needed. 120 tablet 0   Oxycodone HCl 10 MG TABS Take 1 tablet (10 mg total) by mouth 4 (four) times daily as needed. 120 tablet 0   pantoprazole (PROTONIX) 40 MG tablet Take 1 tablet (40 mg total) by mouth daily. 90 tablet 3   phentermine (ADIPEX-P) 37.5 MG tablet Take 1 tablet (37.5 mg total) by mouth daily before breakfast. 30 tablet 2   ketoconazole (NIZORAL) 2 % cream Apply  1 application topically daily. 45 g 1   linaclotide (LINZESS) 290 MCG CAPS capsule Take 1 capsule (290 mcg total) by mouth daily. 30 capsule 11   Rimegepant Sulfate (NURTEC) 75 MG TBDP Take 75 mg by mouth daily as needed. 16 tablet 5   traZODone (DESYREL) 50 MG tablet Take 0.5-1 tablets (25-50 mg total) by mouth at bedtime as needed for sleep. 30 tablet 5   Ubrogepant (UBRELVY) 100 MG TABS Take 100 mg by mouth daily as needed. 2 tablet 0   No facility-administered medications prior to visit.    ROS: Review of Systems  Constitutional:  Negative for activity change, appetite change, chills, fatigue and unexpected weight change.  HENT:  Negative for congestion, mouth sores and sinus pressure.   Eyes:  Negative for visual disturbance.  Respiratory:  Negative for cough and chest tightness.   Gastrointestinal:  Negative for abdominal pain and nausea.  Genitourinary:  Negative for difficulty urinating, frequency and vaginal pain.  Musculoskeletal:  Positive for back pain. Negative for gait problem.  Skin:  Negative for pallor and rash.  Neurological:  Positive for headaches. Negative for dizziness, tremors, weakness and numbness.  Psychiatric/Behavioral:  Negative for confusion and sleep disturbance.    Objective:  BP (!) 142/82 (BP Location: Left Arm)    Pulse 83    Temp 98.9 F (37.2 C) (Oral)    Ht 5\' 5"  (1.651 m)    Wt 159 lb (72.1 kg)    SpO2 98%    BMI 26.46 kg/m   BP Readings from Last 3 Encounters:  10/30/21 (!) 142/82  08/29/21 138/84  07/31/21 120/80    Wt Readings from Last 3 Encounters:  10/30/21 159 lb (72.1 kg)  08/29/21 170 lb (77.1 kg)  07/31/21 168 lb (76.2 kg)    Physical Exam Constitutional:      General: She is not in acute distress.    Appearance: She is well-developed.  HENT:     Head: Normocephalic.     Right Ear: External ear normal.     Left Ear: External ear normal.     Nose: Nose normal.  Eyes:     General:        Right eye: No discharge.         Left eye: No discharge.     Conjunctiva/sclera: Conjunctivae normal.     Pupils: Pupils are equal, round, and reactive to light.  Neck:     Thyroid: No thyromegaly.     Vascular: No JVD.     Trachea: No tracheal deviation.  Cardiovascular:     Rate and Rhythm: Normal rate and regular rhythm.     Heart sounds: Normal heart sounds.  Pulmonary:     Effort: No respiratory distress.     Breath sounds: No stridor. No wheezing.  Abdominal:     General: Bowel sounds are normal. There is no distension.     Palpations: Abdomen is soft. There is no mass.     Tenderness: There is no abdominal tenderness. There is no right CVA tenderness, guarding or rebound.  Musculoskeletal:        General: Tenderness present.     Cervical back: Normal range of motion and neck supple. No rigidity.  Lymphadenopathy:     Cervical: No cervical adenopathy.  Skin:    Findings: No erythema or rash.  Neurological:     Cranial Nerves: No cranial nerve deficit.     Motor: No abnormal muscle tone.     Coordination: Coordination normal.     Deep Tendon Reflexes: Reflexes normal.  Psychiatric:        Behavior: Behavior normal.        Thought Content: Thought content normal.        Judgment: Judgment normal.    Lab Results  Component Value Date   WBC 5.2 04/25/2021   HGB 13.3 04/25/2021   HCT 39.6 04/25/2021   PLT 210.0 04/25/2021   GLUCOSE 85 04/25/2021   CHOL 225 (H) 04/25/2021   TRIG 110.0 04/25/2021   HDL 70.00 04/25/2021   LDLDIRECT 130.2 12/21/2013   LDLCALC 133 (H) 04/25/2021   ALT 27 04/25/2021   AST 25 04/25/2021   NA 139 04/25/2021   K 4.5 04/25/2021   CL 102 04/25/2021   CREATININE 0.90 04/25/2021   BUN 21 04/25/2021   CO2 29 04/25/2021   TSH 1.81 04/25/2021   HGBA1C 5.5 03/31/2018    MM 3D SCREEN BREAST BILATERAL  Result Date: 09/10/2021 CLINICAL DATA:  Screening. EXAM: DIGITAL SCREENING BILATERAL MAMMOGRAM WITH TOMOSYNTHESIS AND CAD TECHNIQUE: Bilateral screening digital  craniocaudal and mediolateral oblique mammograms were obtained. Bilateral screening digital breast tomosynthesis was performed. The images were evaluated with computer-aided detection. COMPARISON:  Previous exam(s). ACR Breast  Density Category c: The breast tissue is heterogeneously dense, which may obscure small masses. FINDINGS: There are no findings suspicious for malignancy. IMPRESSION: No mammographic evidence of malignancy. A result letter of this screening mammogram will be mailed directly to the patient. RECOMMENDATION: Screening mammogram in one year. (Code:SM-B-01Y) BI-RADS CATEGORY  1: Negative. Electronically Signed   By: Lovey Newcomer M.D.   On: 09/10/2021 08:30    Assessment & Plan:   Problem List Items Addressed This Visit     Migraine, chronic, without aura, intractable (Chronic)    F/u w/ Dr Margorie John On verapamil, trazodone Maxalt as needed      Relevant Medications   Oxycodone HCl 10 MG TABS   Oxycodone HCl 10 MG TABS   Hyperglycemia    Pt lost wt Cont w/Phentermine      LOW BACK PAIN    On Oxycodone  Potential benefits of a long term opioids use as well as potential risks (i.e. addiction risk, apnea etc) and complications (i.e. Somnolence, constipation and others) were explained to the patient and were aknowledged.      Relevant Medications   Oxycodone HCl 10 MG TABS   Oxycodone HCl 10 MG TABS   Menopause    [facility] appt      Onychomycosis    Podiatry ref Dr Amalia Hailey' appt on 11/04/20      SYRINGOMYELIA    F/u w/Dr Tomi Likens Chronic pain         Meds ordered this encounter  Medications   estradiol (ESTRACE) 0.5 MG tablet    Sig: Take 1 tablet (0.5 mg total) by mouth daily.    Dispense:  90 tablet    Refill:  0    This is to replace the Est Patch that was too expensive. thanks   Oxycodone HCl 10 MG TABS    Sig: Take 1 tablet (10 mg total) by mouth every 6 (six) hours as needed.    Dispense:  120 tablet    Refill:  0    Please fill on or  after 12/23/21   Oxycodone HCl 10 MG TABS    Sig: Take 1 tablet (10 mg total) by mouth 4 (four) times daily as needed.    Dispense:  120 tablet    Refill:  0    Please fill on or after 11/23/21   famotidine (PEPCID) 40 MG tablet    Sig: Take 1 tablet (40 mg total) by mouth daily.    Dispense:  90 tablet    Refill:  3   pantoprazole (PROTONIX) 40 MG tablet    Sig: Take 1 tablet (40 mg total) by mouth at bedtime.    Dispense:  90 tablet    Refill:  3      Follow-up: Return in about 2 months (around 12/28/2021) for a follow-up visit.  Walker Kehr, MD

## 2021-10-30 NOTE — Assessment & Plan Note (Signed)
Podiatry ref Dr Amalia Hailey' appt on 11/04/20

## 2021-10-30 NOTE — Patient Instructions (Signed)
Try GLYTONE exfoliating body lotion by Desmond Dike (free acid value 17.5) for rough skin

## 2021-10-30 NOTE — Assessment & Plan Note (Signed)
F/u w/ Dr Margorie John On verapamil, trazodone Maxalt as needed

## 2021-11-04 ENCOUNTER — Ambulatory Visit: Payer: PPO | Admitting: Internal Medicine

## 2021-11-04 ENCOUNTER — Ambulatory Visit: Payer: PPO | Admitting: Podiatry

## 2021-11-06 ENCOUNTER — Other Ambulatory Visit: Payer: Self-pay | Admitting: Internal Medicine

## 2021-11-06 NOTE — Telephone Encounter (Signed)
F/u  Beth Huffman (Key: [id number]) Nurtec 75MG  dispersible tablets   Form [employer] 4-Part Electronic PA Form (2017 NCPDP) Created 10 days ago Sent to Plan 8 days ago Plan Response 8 days ago Submit Clinical Questions Determination 1 day ago  Message from Plan Patient not eligible (does not have coverage with the PBM/payer)

## 2021-11-12 ENCOUNTER — Telehealth: Payer: Self-pay | Admitting: *Deleted

## 2021-11-12 NOTE — Telephone Encounter (Signed)
Patient called requesting refill on estradiol 0.5 mg tablet,patient overdue for annual exam. Message sent to appointment to call and schedule then Rx will be sent to [facility] for response.

## 2021-11-14 NOTE — Telephone Encounter (Signed)
Appointment reached out to patient several times to schedule and not response.

## 2021-11-15 NOTE — Telephone Encounter (Signed)
Patient is scheduled for 02/13/22 for aex.

## 2021-11-15 NOTE — Telephone Encounter (Signed)
It appears Dr.Plotnikov refilled patient estradiol 0.5 mg on 10/2021.   Encounter closed.

## 2021-11-22 ENCOUNTER — Encounter: Payer: Self-pay | Admitting: Internal Medicine

## 2021-11-22 ENCOUNTER — Other Ambulatory Visit: Payer: Self-pay | Admitting: Internal Medicine

## 2021-11-25 ENCOUNTER — Ambulatory Visit (INDEPENDENT_AMBULATORY_CARE_PROVIDER_SITE_OTHER): Payer: PPO

## 2021-11-25 ENCOUNTER — Other Ambulatory Visit: Payer: Self-pay

## 2021-11-25 ENCOUNTER — Ambulatory Visit: Payer: PPO | Admitting: Podiatry

## 2021-11-25 ENCOUNTER — Other Ambulatory Visit: Payer: Self-pay | Admitting: Internal Medicine

## 2021-11-25 DIAGNOSIS — R3915 Urgency of urination: Secondary | ICD-10-CM

## 2021-11-25 DIAGNOSIS — M7741 Metatarsalgia, right foot: Secondary | ICD-10-CM | POA: Diagnosis not present

## 2021-11-25 DIAGNOSIS — M2041 Other hammer toe(s) (acquired), right foot: Secondary | ICD-10-CM

## 2021-11-25 DIAGNOSIS — M2042 Other hammer toe(s) (acquired), left foot: Secondary | ICD-10-CM

## 2021-11-25 DIAGNOSIS — M2011 Hallux valgus (acquired), right foot: Secondary | ICD-10-CM | POA: Diagnosis not present

## 2021-11-25 NOTE — Progress Notes (Signed)
Subjective: 61 y.o. female presenting today as a new patient for evaluation of a symptomatic bunion and hammertoes to the right foot.  Patient states that she has had bunions and hammertoes to the right foot for several years.  Unfortunately she has been taking care of her mother who lived in her home for quite some time and she did not prioritize her own health at the time.  Her mother passed away 05-23-21 and she presents today to address her own health and discuss treatment for the bunion and hammertoe deformities.  She tries different shoes with no relief or improvement of her symptoms.  She says that she is ready to have surgery to have them corrected for.  They continue to be painful and very symptomatic on a daily basis.  Past Medical History:  Diagnosis Date   Abnormal weight gain    Acute sinusitis 12/05/2008   1/18 8/18   Arachnoid cyst 08/19/2011   Chest tightness or pressure 05/07/2018   Constipation 09/08/2012   Chronic - opioid related 11/17 Amitiza   Contact dermatitis 03/16/2013   5/14 relapsing - poison ivy    Dyspnea 05/07/2018   Elevated liver enzymes 03/04/2011   Chronic off and on    Female stress incontinence 02/06/2009   Qualifier: Diagnosis of  By: Alain Marion MD, Evie Lacks    FEVER UNSPECIFIED 09/29/2007   Qualifier: Diagnosis of  By: Alain Marion MD, Evie Lacks    GERD 09/29/2007   Chronic  Protonix prn  Potential benefits of a long term PPI use as well as potential risks  and complications were explained to the patient and were aknowledged.     GERD (gastroesophageal reflux disease)    Headache 12/20/2007   Dr Hassell Done Migraines (1 per 2 wks) Topamax 400 mg/d; Imitrex prn; Ibuprofen prn, Zofran prn    Hyperglycemia 08/19/2011   Mild     Hypertension    Increased blood pressure (not hypertension) 08/23/2014   Intractable migraine 06/14/2009   Chronic Ibuprofen, Imitrex, Topamax,  Nortriptyline - intolerant   Knee pain 03/04/2011   R knee 2018 L knee pain - L knee b anserina  and L knee is tender w/ROM: L knee pain is worse - patellofemoral syndrome vs other   LBP (low back pain)    LOW BACK PAIN 09/29/2007   Chronic, lately (2016) disabling Due to syringomyelia On Oxycodone  Potential benefits of a long term opioids use as well as potential risks (i.e. addiction risk, apnea etc) and complications (i.e. Somnolence, constipation and others) were explained to the patient and were aknowledged.     LUQ PAIN 05/23/2010   Qualifier: Diagnosis of  By: Plotnikov MD, Evie Lacks    Migraine, chronic, without aura, intractable 10/28/2017   Botox approved diagnosis. 7/19 Worse. Start Emagility inj. Relpax   Migraines    Nausea & vomiting 08/19/2011   Nausea without vomiting 06/14/2009   Due to migraines     NECK PAIN 06/14/2009    Chronic, lately (2016) disabling Due to syringomyelia On Oxycodone  Potential benefits of a long term opioids use as well as potential risks (i.e. addiction risk, apnea etc) and complications (i.e. Somnolence, constipation and others) were explained to the patient and were aknowledged.     NONSPECIFIC ABNORMAL RESULTS LIVR FUNCTION STUDY 09/28/2009   Qualifier: Diagnosis of  By: Alain Marion MD, Evie Lacks    OBSESSIVE-COMPULSIVE DISORDER 05/23/2010   Chronic - compulsive shopping    Onychomycosis 01/30/2016   4/17  Orthostatic hypotension 06/30/2013   9/14 likely Rapaflo induced    OTITIS EXTERNA 05/10/2008   Qualifier: Diagnosis of  By: Alain Marion MD, Evie Lacks    Palpitations 06/21/2014   PARESTHESIA 07/13/2008   Qualifier: Diagnosis of  By: Alain Marion MD, Evie Lacks    Partial thickness burn of left wrist 09/11/2015   Postoperative state 07/24/2016   Pyelonephritis 2008   Rash 07/27/2017   Stress 2009   Syncope, near 06/30/2013   9/14 likely Rapaflo induced 12/14 relapsing off Rapaflo x 7 2/15 no relapsing since on 1/2 dose of Topamax    Syringobulbia ([place])    SYRINGOMYELIA 08/20/2007   Chronic Dr Krista Blue Dr Jalene Mullet at [facility] Chronic pain    Syringomyelia ([place])     Tachycardia    Tonsillitis, chronic 02/26/2017   2018 worse   URINARY TRACT INFECTION (UTI) 03/23/2008   Qualifier: Diagnosis of  By: Alain Marion MD, Evie Lacks    UTI (lower urinary tract infection)    Well adult exam 12/21/2013   We discussed age appropriate health related issues, including available/recomended screening tests and vaccinations. We discussed a need for adhering to healthy diet and exercise. Labs/EKG were reviewed/ordered. All questions were answered.      Past Surgical History:  Procedure Laterality Date   ABDOMINAL HYSTERECTOMY     ANTERIOR AND POSTERIOR REPAIR N/A 07/24/2016   Procedure: Possible ANTERIOR (CYSTOCELE) repair, perineoplasty;  Surgeon: Princess Bruins, MD;  Location: [facility];  Service: Gynecology;  Laterality: N/A;   BLADDER SUSPENSION N/A 07/24/2016   Procedure: TRANSVAGINAL TAPE (TVT) PROCEDURE;  Surgeon: Princess Bruins, MD;  Location: [facility];  Service: Gynecology;  Laterality: N/A;   COLONOSCOPY     CYSTOSCOPY N/A 07/24/2016   Procedure: CYSTOSCOPY;  Surgeon: Princess Bruins, MD;  Location: [facility];  Service: Gynecology;  Laterality: N/A;   intracranial decompression surgery  2006   LEFT HEART CATH AND CORONARY ANGIOGRAPHY N/A 05/07/2018   Procedure: LEFT HEART CATH AND CORONARY ANGIOGRAPHY;  Surgeon: Belva Crome, MD;  Location: [facility];  Service: Cardiovascular;  Laterality: N/A;   PARTIAL KNEE ARTHROPLASTY Left 09/07/2018   Procedure: UNICOMPARTMENTAL LEFT KNEE;  Surgeon: Renette Butters, MD;  Location: [facility];  Service: Orthopedics;  Laterality: Left;  Adductor Block   ROBOTIC ASSISTED LAPAROSCOPIC SACROCOLPOPEXY N/A 07/24/2016   Procedure: ROBOTIC ASSISTED LAPAROSCOPIC SACROCOLPOPEXY WITH PERINEOPLASTY;  Surgeon: Princess Bruins, MD;  Location: [facility];  Service: Gynecology;  Laterality: N/A;   TUBAL LIGATION     Allergies  Allergen Reactions   Atenolol Other (See Comments)    Wt gain   Cefuroxime Axetil Nausea Only and Other (See  Comments)    REACTION: nausea - but tolerates PCN   Codeine Sulfate Nausea And Vomiting and Nausea Only   Fentanyl Nausea And Vomiting and Other (See Comments)    REACTION: bad reaction   Nitrofurantoin Other (See Comments)    Unknown   Pregabalin Other (See Comments)    REACTION: cp   Rapaflo [Silodosin] Other (See Comments)    Orthostatic BP drop, near syncope    Topamax [Topiramate]     Tight chest    Tramadol Hcl Nausea And Vomiting    REACTION: sick   Iodinated Contrast Media Rash    Objective: Physical Exam General: The patient is alert and oriented x3 in no acute distress.  Dermatology: Skin is cool, dry and supple bilateral lower extremities. Negative for open lesions or macerations.  There are some hyperkeratotic very symptomatic calluses to the  plantar aspect of the second third metatarsal heads which are palpable.  Vascular: Palpable pedal pulses bilaterally. No edema or erythema noted. Capillary refill within normal limits.  Neurological: Epicritic and protective threshold grossly intact bilaterally.   Musculoskeletal Exam: Clinical evidence of bunion deformity noted to the respective foot. There is moderate pain on palpation range of motion of the first MPJ. Lateral deviation of the hallux noted consistent with hallux abductovalgus which is pressing against the second toe. Hammertoe contracture also noted on clinical exam to the second digit of the right foot as well. Symptomatic pain on palpation and range of motion also noted to the metatarsal phalangeal joints of the respective hammertoe digits.  Patient also has significant tenderness to palpation along the plantar aspect of the second third metatarsal heads consistent with lesser metatarsalgia.  Patient developed symptomatic calluses in this area as well due to the pressure that was applied in this area with ambulation There is some lateral deviation of the third digit of the right foot at the level of the DIPJ.  She  denies any issues or symptoms with this area so we will not address it surgically.  Radiographic Exam: Increased intermetatarsal angle greater than 15 with a hallux abductus angle greater than 30 noted on AP view. Moderate degenerative changes noted within the first MPJ. Contracture deformity also noted to the interphalangeal joints and MPJs of the digits of the respective hammertoes.  Assessment: 1. HAV w/ bunion deformity right 2. Hammertoe deformity second digit right 3.  Metatarsalgia second and third metatarsals right  Plan of Care:  1. Patient was evaluated. X-Rays reviewed. 2. Today we discussed the conservative versus surgical management of the presenting pathology. The patient opts for surgical management. All possible complications and details of the procedure were explained. All patient questions were answered. No guarantees were expressed or implied. 3. Authorization for surgery was initiated today. Surgery will consist of bunionectomy with osteotomy right.  PIPJ arthroplasty second digit right.  Weil shortening metatarsal osteotomies 2, 3 right 4.  Patient states that she does have a history of tachycardia and currently takes verapamil from her cardiologist.  We will need cardiology clearance prior to surgery  5.  Return to clinic 1 week postop     Edrick Kins, DPM [facility]  Dr. Edrick Kins, DPM    [address], [place] [address]                Office [phone]  Fax [phone]

## 2021-11-26 ENCOUNTER — Telehealth: Payer: Self-pay

## 2021-11-26 MED ORDER — PHENTERMINE HCL 37.5 MG PO TABS: 37.5000 mg | ORAL_TABLET | Freq: Every day | ORAL | 2 refills | Status: DC

## 2021-11-26 NOTE — Telephone Encounter (Signed)
New message   Elias Bordner Key: [id number] help? Call us at [phone] Outcome Additional Information Required Prior Authorization duplicate/approved Drug Botox 200UNIT solution Form [facility] [employer] Electronic Prior Authorization Form 2017 NCPDP

## 2021-11-26 NOTE — Telephone Encounter (Signed)
New message   Benefit Verification [id number] Submitted! For BV Basic submissions, please allow 2 business days for results.  For BV Full submissions, please allow 4 business days for results.

## 2021-11-28 NOTE — Telephone Encounter (Signed)
BOTOX (onabotulinumtoxinA)  ACQUISITION - [employer] BENEFITS List of Specialty Pharmacies may not include all available options. If preferred Specialty Pharmacy is not listed, check with payer.  [ ]  Buy and Bill [ ]  Buy and Weed Available [ ]  Specialty Pharmacy Required [ ]  Prescription Coverage Only - See Pharmacy Benefits Section [?]Payer will not release information Specialty Pharmacies:     [place] Medical benefit coverage is unknown at this time because the insurer will not release information to a third party. The insurer will only release medical information directly to the patient or provider. Please contact the payer at [phone] .

## 2021-12-02 ENCOUNTER — Ambulatory Visit: Payer: PPO | Admitting: Internal Medicine

## 2021-12-02 ENCOUNTER — Telehealth: Payer: Self-pay

## 2021-12-02 DIAGNOSIS — G43709 Chronic migraine without aura, not intractable, without status migrainosus: Secondary | ICD-10-CM

## 2021-12-02 MED ORDER — BOTOX 200 UNITS IJ SOLR: INTRAMUSCULAR | 4 refills | Status: DC

## 2021-12-02 NOTE — Addendum Note (Signed)
Addended by: Venetia Night on: 12/02/2021 09:48 AM   Modules accepted: Orders

## 2021-12-02 NOTE — Telephone Encounter (Signed)
New message   Call [facility] order Botox - the last notation was made from the patient in May 2022. Inform [facility] of delivery in Nov 2022.   Call and left a detailed voicemail for the patient to call me back at  [phone].

## 2021-12-04 NOTE — Telephone Encounter (Signed)
F/u  Call the patient [phone] left a detailed message to contacted [facility] for delivery of Botox.   Upcoming appt on 12/13/21

## 2021-12-05 ENCOUNTER — Telehealth: Payer: Self-pay | Admitting: Internal Medicine

## 2021-12-05 NOTE — Telephone Encounter (Signed)
° °  Pre-operative Risk Assessment    Patient Name: Beth Huffman  DOB: [birth year] MRN: [id number]      Request for Surgical Clearance    Procedure:   [place] bunionectomy  Metatarsal osteotomy Hammer toe repair  Date of Surgery:  Clearance 01/09/22  all done on the same day                     Surgeon:  Dr. Daylene Katayama Surgeon's Group or Practice Name:  [facility] Phone number:  [phone] Fax number:  [phone]   Type of Clearance Requested:   - Medical  - Pharmacy:  requests office let them know if a medication needs to be held   Type of Anesthesia:   Choice   Additional requests/questions:    Signed, Selena Zobro   12/05/2021, 1:58 PM

## 2021-12-05 NOTE — Telephone Encounter (Signed)
Patient contacted and unavailable to speak today for surgical clearance.  Patient advised to give a call back at earliest convenience.

## 2021-12-06 NOTE — Telephone Encounter (Signed)
Pt has appt with Dr. Caryl Comes 12/16/21. I have added pre op clearance is needed. I will update Dr. Caryl Comes and his nurse pt has appt 12/16/21 and will need pre op clearance. I will send FYI to requesting office the pt has appt 12/16/21

## 2021-12-06 NOTE — Telephone Encounter (Signed)
Thank you :)

## 2021-12-06 NOTE — Telephone Encounter (Signed)
° °  Name: Beth Huffman  DOB: Mar 19, 1961  MRN: [id number]  Primary Cardiologist: Dr. Caryl Comes  Chart reviewed as part of pre-operative protocol coverage. Because of Beth Huffman's past medical history and time since last visit, she will require a follow-up visit in order to better assess preoperative cardiovascular risk.  Pre-op covering staff: - Please schedule appointment and call patient to inform them. If patient already had an upcoming appointment within acceptable timeframe, please add "pre-op clearance" to the appointment notes so provider is aware. - Please contact requesting surgeon's office via preferred method (i.e, phone, fax) to inform them of need for appointment prior to surgery.  If applicable, this message will also be routed to pharmacy pool and/or primary cardiologist for input on holding anticoagulant/antiplatelet agent as requested below so that this information is available to the clearing provider at time of patient's appointment.   Page, [place]  12/06/2021, 11:12 AM

## 2021-12-06 NOTE — Telephone Encounter (Signed)
Patient is returning NP's call.

## 2021-12-12 DIAGNOSIS — J309 Allergic rhinitis, unspecified: Secondary | ICD-10-CM | POA: Diagnosis not present

## 2021-12-12 DIAGNOSIS — T413X5A Adverse effect of local anesthetics, initial encounter: Secondary | ICD-10-CM | POA: Diagnosis not present

## 2021-12-13 ENCOUNTER — Telehealth: Payer: Self-pay | Admitting: Urology

## 2021-12-13 ENCOUNTER — Ambulatory Visit: Payer: PPO | Admitting: Neurology

## 2021-12-13 ENCOUNTER — Other Ambulatory Visit: Payer: Self-pay

## 2021-12-13 DIAGNOSIS — G43709 Chronic migraine without aura, not intractable, without status migrainosus: Secondary | ICD-10-CM

## 2021-12-13 MED ORDER — ONABOTULINUMTOXINA 100 UNITS IJ SOLR
200.0000 [IU] | Freq: Once | INTRAMUSCULAR | Status: AC
  Administered 2021-12-13: 155 [IU] via INTRAMUSCULAR

## 2021-12-13 NOTE — Progress Notes (Signed)
[facility]  ° °Procedure Note Botox ° °Attending: Dr. Makya Phillis ° °Preoperative Diagnosis(es): Chronic migraine ° °Consent obtained from: The patient °Benefits discussed included, but were not limited to decreased muscle tightness, increased joint range of motion, and decreased pain.  Risk discussed included, but were not limited pain and discomfort, bleeding, bruising, excessive weakness, venous thrombosis, muscle atrophy and dysphagia.  Anticipated outcomes of the procedure as well as he risks and benefits of the alternatives to the procedure, and the roles and tasks of the personnel to be involved, were discussed with the patient, and the patient consents to the procedure and agrees to proceed. A copy of the patient medication guide was given to the patient which explains the blackbox warning. ° °Patients identity and treatment sites confirmed Yes.  . ° °Details of Procedure: °Skin was cleaned with alcohol. Prior to injection, the needle plunger was aspirated to make sure the needle was not within a blood vessel.  There was no blood retrieved on aspiration.   ° °Following is a summary of the muscles injected  And the amount of Botulinum toxin used: ° °Dilution °200 units of Botox was reconstituted with 4 ml of preservative free normal saline. °Time of reconstitution: At the time of the office visit (<30 minutes prior to injection)  ° °Injections  °155 total units of Botox was injected with a 30 gauge needle. ° °Injection Sites: °L occipitalis: 15 units- 3 sites  °R occiptalis: 15 units- 3 sites ° °L upper trapezius: 15 units- 3 sites °R upper trapezius: 15 units- 3 sits          °L paraspinal: 10 units- 2 sites °R paraspinal: 10 units- 2 sites ° °Face °L frontalis(2 injection sites):10 units   °R frontalis(2 injection sites):10 units         °L corrugator: 5 units   °R corrugator: 5 units           °Procerus: 5 units   °L temporalis: 20 units °R temporalis: 20 units  ° °Agent:  °200 units of botulinum Type  A (Onobotulinum Toxin type A) was reconstituted with 4 ml of preservative free normal saline.  °Time of reconstitution: At the time of the office visit (<30 minutes prior to injection)  ° ° ° Total injected (Units):  155 ° Total wasted (Units):  45 ° °Patient tolerated procedure well without complications.   °Reinjection is anticipated in 3 months. ° ° °

## 2021-12-13 NOTE — Telephone Encounter (Signed)
DOS - 01/09/22  [place] BUNIONECTOMY RIGHT --- 05110 METATARSAL OSTEOTOMY 2,3 RIGHT --- 21117 HAMMERTOE REPAIR 2ND RIGHT --- 35670  [facility] EFFECTIVE DATE - 11/27/2018   RECEIVED FAX FROM [facility] STATING THAT CPT CODES 14103, 28308 AND 01314 HAVE BEEN APPROVED, AUTH # [id number], GOOD FROM 01/09/22 - 04/09/22.

## 2021-12-16 ENCOUNTER — Ambulatory Visit: Payer: PPO | Admitting: Internal Medicine

## 2021-12-16 ENCOUNTER — Encounter: Payer: Self-pay | Admitting: Internal Medicine

## 2021-12-16 ENCOUNTER — Other Ambulatory Visit: Payer: Self-pay

## 2021-12-16 VITALS — BP 120/90 | HR 83 | Ht 65.0 in | Wt 153.8 lb

## 2021-12-16 DIAGNOSIS — I471 Supraventricular tachycardia: Secondary | ICD-10-CM

## 2021-12-16 DIAGNOSIS — R072 Precordial pain: Secondary | ICD-10-CM | POA: Diagnosis not present

## 2021-12-16 DIAGNOSIS — Z01812 Encounter for preprocedural laboratory examination: Secondary | ICD-10-CM

## 2021-12-16 LAB — BASIC METABOLIC PANEL
BUN/Creatinine Ratio: 17 (ref 12–28)
BUN: 14 mg/dL (ref 8–27)
CO2: 27 mmol/L (ref 20–29)
Calcium: 9.7 mg/dL (ref 8.7–10.3)
Chloride: 101 mmol/L (ref 96–106)
Creatinine, Ser: 0.84 mg/dL (ref 0.57–1.00)
Glucose: 79 mg/dL (ref 70–99)
Potassium: 4.4 mmol/L (ref 3.5–5.2)
Sodium: 141 mmol/L (ref 134–144)
eGFR: 79 mL/min/{1.73_m2} (ref >59)

## 2021-12-16 MED ORDER — METOPROLOL TARTRATE 100 MG PO TABS: ORAL_TABLET | ORAL | 0 refills | Status: DC

## 2021-12-16 MED ORDER — PREDNISONE 50 MG PO TABS: ORAL_TABLET | ORAL | 0 refills | Status: DC

## 2021-12-16 MED ORDER — ISOSORBIDE MONONITRATE ER 30 MG PO TB24: 30.0000 mg | ORAL_TABLET | Freq: Every day | ORAL | 3 refills | Status: DC

## 2021-12-16 NOTE — Progress Notes (Signed)
-     Patient Care Team: Plotnikov, Evie Lacks, MD as PCP - General Princess Bruins, MD as Consulting Physician (Obstetrics and Gynecology) Pieter Partridge, DO as Consulting Physician (Neurology)   HPI  Beth Huffman is a 61 y.o. female Seen in follow-up for symptomatic nonsustained atrial tachycardia and hypertension  She has hypertension.   Seen for surgical clearance.    She has however noted over the last 3-6 months progressive and predictable dyspnea with exertion as well as chest discomfort that is nonradiating.  She notes this both while walking,  (being walked by) her 80 pound dog as well as with swimming.  Both are relieved by rest.  Positive family history.  Positive dyslipidemia.  Has also had 2 episodes in the last 3 months while seated accompanied also by some lightheadedness.  No palpitations.     DATE TEST EF   7/19 Echo   60-65 %   7/19 LHC  65 % Normal CAs        Date Cr K Hgb  11/19 0.98 3.8 13.2   6/22 0.9 4.5 13.3       Records and Results Reviewed   Past Medical History:  Diagnosis Date   Abnormal weight gain    Acute sinusitis 12/05/2008   1/18 8/18   Arachnoid cyst 08/19/2011   Chest tightness or pressure 05/07/2018   Constipation 09/08/2012   Chronic - opioid related 11/17 Amitiza   Contact dermatitis 03/16/2013   5/14 relapsing - poison ivy    Dyspnea 05/07/2018   Elevated liver enzymes 03/04/2011   Chronic off and on    Female stress incontinence 02/06/2009   Qualifier: Diagnosis of  By: Alain Marion MD, Evie Lacks    FEVER UNSPECIFIED 09/29/2007   Qualifier: Diagnosis of  By: Alain Marion MD, Evie Lacks    GERD 09/29/2007   Chronic  Protonix prn  Potential benefits of a long term PPI use as well as potential risks  and complications were explained to the patient and were aknowledged.     GERD (gastroesophageal reflux disease)    Headache 12/20/2007   Dr Hassell Done Migraines (1 per 2 wks) Topamax 400 mg/d; Imitrex prn; Ibuprofen prn, Zofran prn     Hyperglycemia 08/19/2011   Mild     Hypertension    Increased blood pressure (not hypertension) 08/23/2014   Intractable migraine 06/14/2009   Chronic Ibuprofen, Imitrex, Topamax,  Nortriptyline - intolerant   Knee pain 03/04/2011   R knee 2018 L knee pain - L knee b anserina and L knee is tender w/ROM: L knee pain is worse - patellofemoral syndrome vs other   LBP (low back pain)    LOW BACK PAIN 09/29/2007   Chronic, lately (2016) disabling Due to syringomyelia On Oxycodone  Potential benefits of a long term opioids use as well as potential risks (i.e. addiction risk, apnea etc) and complications (i.e. Somnolence, constipation and others) were explained to the patient and were aknowledged.     LUQ PAIN 05/23/2010   Qualifier: Diagnosis of  By: Plotnikov MD, Evie Lacks    Migraine, chronic, without aura, intractable 10/28/2017   Botox approved diagnosis. 7/19 Worse. Start Emagility inj. Relpax   Migraines    Nausea & vomiting 08/19/2011   Nausea without vomiting 06/14/2009   Due to migraines     NECK PAIN 06/14/2009    Chronic, lately (2016) disabling Due to syringomyelia On Oxycodone  Potential benefits of a long term opioids use as well as potential  risks (i.e. addiction risk, apnea etc) and complications (i.e. Somnolence, constipation and others) were explained to the patient and were aknowledged.     NONSPECIFIC ABNORMAL RESULTS LIVR FUNCTION STUDY 09/28/2009   Qualifier: Diagnosis of  By: Alain Marion MD, Evie Lacks    OBSESSIVE-COMPULSIVE DISORDER 05/23/2010   Chronic - compulsive shopping    Onychomycosis 01/30/2016   4/17    Orthostatic hypotension 06/30/2013   9/14 likely Rapaflo induced    OTITIS EXTERNA 05/10/2008   Qualifier: Diagnosis of  By: Alain Marion MD, Evie Lacks    Palpitations 06/21/2014   PARESTHESIA 07/13/2008   Qualifier: Diagnosis of  By: Alain Marion MD, Evie Lacks    Partial thickness burn of left wrist 09/11/2015   Postoperative state 07/24/2016   Pyelonephritis 2008   Rash  07/27/2017   Stress 2009   Syncope, near 06/30/2013   9/14 likely Rapaflo induced 12/14 relapsing off Rapaflo x 7 2/15 no relapsing since on 1/2 dose of Topamax    Syringobulbia ([place])    SYRINGOMYELIA 08/20/2007   Chronic Dr Krista Blue Dr Jalene Mullet [facility] Chronic pain    Syringomyelia ([place])    Tachycardia    Tonsillitis, chronic 02/26/2017   2018 worse   URINARY TRACT INFECTION (UTI) 03/23/2008   Qualifier: Diagnosis of  By: Alain Marion MD, Evie Lacks    UTI (lower urinary tract infection)    Well adult exam 12/21/2013   We discussed age appropriate health related issues, including available/recomended screening tests and vaccinations. We discussed a need for adhering to healthy diet and exercise. Labs/EKG were reviewed/ordered. All questions were answered.       Past Surgical History:  Procedure Laterality Date   ABDOMINAL HYSTERECTOMY     ANTERIOR AND POSTERIOR REPAIR N/A 07/24/2016   Procedure: Possible ANTERIOR (CYSTOCELE) repair, perineoplasty;  Surgeon: Princess Bruins, MD;  Location: [facility];  Service: Gynecology;  Laterality: N/A;   BLADDER SUSPENSION N/A 07/24/2016   Procedure: TRANSVAGINAL TAPE (TVT) PROCEDURE;  Surgeon: Princess Bruins, MD;  Location: [facility];  Service: Gynecology;  Laterality: N/A;   COLONOSCOPY     CYSTOSCOPY N/A 07/24/2016   Procedure: CYSTOSCOPY;  Surgeon: Princess Bruins, MD;  Location: [facility];  Service: Gynecology;  Laterality: N/A;   intracranial decompression surgery  2006   LEFT HEART CATH AND CORONARY ANGIOGRAPHY N/A 05/07/2018   Procedure: LEFT HEART CATH AND CORONARY ANGIOGRAPHY;  Surgeon: Belva Crome, MD;  Location: [facility];  Service: Cardiovascular;  Laterality: N/A;   PARTIAL KNEE ARTHROPLASTY Left 09/07/2018   Procedure: UNICOMPARTMENTAL LEFT KNEE;  Surgeon: Renette Butters, MD;  Location: [facility];  Service: Orthopedics;  Laterality: Left;  Adductor Block   ROBOTIC ASSISTED LAPAROSCOPIC SACROCOLPOPEXY N/A 07/24/2016   Procedure: ROBOTIC  ASSISTED LAPAROSCOPIC SACROCOLPOPEXY WITH PERINEOPLASTY;  Surgeon: Princess Bruins, MD;  Location: [facility];  Service: Gynecology;  Laterality: N/A;   TUBAL LIGATION      Current Outpatient Medications  Medication Sig Dispense Refill   BOTOX 200 units SOLR Inject 155 units IM into multiple site in the face,neck and head once every 90 days 1 each 4   cholecalciferol (VITAMIN D) 1000 UNITS tablet Take 1,000 Units by mouth daily.     cyclobenzaprine (FLEXERIL) 5 MG tablet TAKE 1 TABLET THREE TIMES DAILY AS NEEDED FOR MUSCLE SPASMS. 90 tablet 3   estradiol (ESTRACE) 0.5 MG tablet Take 1 tablet (0.5 mg total) by mouth daily. 90 tablet 0   famotidine (PEPCID) 40 MG tablet Take 1 tablet (40 mg total) by  mouth daily. 90 tablet 3   ibuprofen (IBU) 600 MG tablet TAKE 1 TABLET EVERY 8 HOURS AS NEEDED FOR HEADACHE. 90 tablet 1   Multiple Vitamin (MULTIVITAMIN PO) Take 1 tablet by mouth daily.     naproxen (NAPROSYN) 500 MG tablet TAKE 1 TABLET BY MOUTH TWICE DAILY WITH A MEAL. 60 tablet 3   ondansetron (ZOFRAN) 8 MG tablet TAKE ONE TABLET BY MOUTH EVERY 8 HOURS AS NEEDED FOR NAUSEA OR FOR VOMITING 21 tablet 3   Oxycodone HCl 10 MG TABS Take 1 tablet (10 mg total) by mouth every 6 (six) hours as needed. 120 tablet 0   Oxycodone HCl 10 MG TABS Take 1 tablet (10 mg total) by mouth 4 (four) times daily as needed. 120 tablet 0   pantoprazole (PROTONIX) 40 MG tablet Take 1 tablet (40 mg total) by mouth at bedtime. 90 tablet 3   phentermine (ADIPEX-P) 37.5 MG tablet Take 1 tablet (37.5 mg total) by mouth daily before breakfast. 30 tablet 2   polyethylene glycol (MIRALAX / GLYCOLAX) packet Take 8.5 g by mouth every other day.     rizatriptan (MAXALT) 10 MG tablet TAKE 1 TABLET AT ONSET OF MIGRAINE- MAY REPEAT ONCE IN 2 HOURS. LIMIT 2/24 HOURS. AVOID DAILY USE. 8 tablet 3   SUMAtriptan (IMITREX) 100 MG tablet TAKE 1 TABLET AT ONSET OF MIGRAINE- MAY REPEAT ONCE IN 2 HOURS. LIMIT 2/24 HOURS. AVOID DAILY USE. 9 tablet  3   verapamil (CALAN-SR) 120 MG CR tablet TAKE ONE TABLET AT BEDTIME. 90 tablet 3   amoxicillin (AMOXIL) 500 MG capsule SMARTSIG:4 Capsule(s) By Mouth     No current facility-administered medications for this visit.    Allergies  Allergen Reactions   Atenolol Other (See Comments)    Wt gain   Cefuroxime Axetil Nausea Only and Other (See Comments)    REACTION: nausea - but tolerates PCN   Codeine Sulfate Nausea And Vomiting and Nausea Only   Fentanyl Nausea And Vomiting and Other (See Comments)    REACTION: bad reaction   Nitrofurantoin Other (See Comments)    Unknown   Pregabalin Other (See Comments)    REACTION: cp   Rapaflo [Silodosin] Other (See Comments)    Orthostatic BP drop, near syncope    Topamax [Topiramate]     Tight chest    Tramadol Hcl Nausea And Vomiting    REACTION: sick   Iodinated Contrast Media Rash      Review of Systems negative except from HPI and PMH  Physical Exam BP 120/90    Pulse 83    Ht 5\' 5"  (1.651 m)    Wt 153 lb 12.8 oz (69.8 kg)    SpO2 96%    BMI 25.59 kg/m  Well developed and nourished in no acute distress HENT normal Neck supple with JVP-  flat  Clear Regular rate and rhythm, no murmurs or gallops Abd-soft with active BS No Clubbing cyanosis edema Skin-warm and dry A & Oriented  Grossly normal sensory and motor function  ECG sinus at 83 Intervals 13/10/37   Assessment and  Plan  Exertional chest discomfort and shortness of breath  Hypertension   Atrial tachycardia  HA migraines  Syringomyelia   Blood pressure is well controlled.  Concerned about the dyspnea on exertion accompanied by chest discomfort that this could be coronary artery disease given the predictable nature associated with exertion.  We will undertake CTA.  If this is unrevealing, would undertake Myoview scanning given the pretest probability  and the issues of women with nonepicardial coronary disease.  We will hold off on lipid lowering therapy until we  have some idea whether she has coronary artery calcium, we will begin her on baby aspirin.  As well as isosorbide 30  Risk should be acceptable.  We will try to have the information clarified prior to that.

## 2021-12-16 NOTE — Patient Instructions (Addendum)
Medication Instructions:  Your physician has recommended you make the following change in your medication:   Begin Aspirin 81mg  - 1 tablet by mouth daily  Begin Isosorbide 30mg  - 1 tablet by mouth daily   *If you need a refill on your cardiac medications before your next appointment, please call your pharmacy*   Lab Work: BMET today If you have labs (blood work) drawn today and your tests are completely normal, you will receive your results only by: [place] (if you have MyChart) OR A paper copy in the mail If you have any lab test that is abnormal or we need to change your treatment, we will call you to review the results.   Testing/Procedures: Your physician has requested that you have cardiac CT. Cardiac computed tomography (CT) is a painless test that uses an x-ray machine to take clear, detailed pictures of your heart. For further information please visit [email or web address]. Please follow instruction sheet as given.     Follow-Up: At [facility], you and your health needs are our priority.  As part of our continuing mission to provide you with exceptional heart care, we have created designated Provider Care Teams.  These Care Teams include your primary Cardiologist (physician) and Advanced Practice Providers (APPs -  Physician Assistants and Nurse Practitioners) who all work together to provide you with the care you need, when you need it.  We recommend signing up for the patient portal called "MyChart".  Sign up information is provided on this After Visit Summary.  MyChart is used to connect with patients for Virtual Visits (Telemedicine).  Patients are able to view lab/test results, encounter notes, upcoming appointments, etc.  Non-urgent messages can be sent to your provider as well.   To learn more about what you can do with MyChart, go to [email or web address].    Your next appointment:   12 months with Dr Caryl Comes    Your cardiac CT will be scheduled at one of  the below locations:   [facility] [address] [place], [place] [address] [phone]   If scheduled at [facility], please arrive at the [facility] main entrance (entrance A) of [facility] 30 minutes prior to test start time. You can use the FREE valet parking offered at the main entrance (encouraged to control the heart rate for the test) Proceed to the [facility]iology Department (first floor) to check-in and test prep.  If scheduled at [facility], please arrive 15 mins early for check-in and test prep.  Please follow these instructions carefully (unless otherwise directed):  Hold all erectile dysfunction medications at least 3 days (72 hrs) prior to test.  On the Night Before the Test: Be sure to Drink plenty of water. Do not consume any caffeinated/decaffeinated beverages or chocolate 12 hours prior to your test. Do not take any antihistamines 12 hours prior to your test. If the patient has contrast allergy: Patient will need a prescription for Prednisone and very clear instructions (as follows): Prednisone 50 mg - take 13 hours prior to test Take another Prednisone 50 mg 7 hours prior to test Take another Prednisone 50 mg 1 hour prior to test Take Benadryl 50 mg 1 hour prior to test  Patient will need a ride after test due to Benadryl. Metoprolol Tartrate 100mg  - 1 tablet by mouth 1.5-2 hours prior to test.   On the Day of the Test: Drink plenty of water until 1 hour prior to the test.  Do not eat any food 4 hours prior to the test. You may take your regular medications prior to the test.  Take metoprolol (Lopressor) two hours prior to test. HOLD Furosemide/Hydrochlorothiazide morning of the test. FEMALES- please wear underwire-free bra if available, avoid dresses & tight clothing        After the Test: Drink plenty of water. After receiving IV contrast, you may experience a mild flushed feeling. This  is normal. On occasion, you may experience a mild rash up to 24 hours after the test. This is not dangerous. If this occurs, you can take Benadryl 25 mg and increase your fluid intake. If you experience trouble breathing, this can be serious. If it is severe call 911 IMMEDIATELY. If it is mild, please call our office. If you take any of these medications: Glipizide/Metformin, Avandament, Glucavance, please do not take 48 hours after completing test unless otherwise instructed.  We will call to schedule your test 2-4 weeks out understanding that some insurance companies will need an authorization prior to the service being performed.   For non-scheduling related questions, please contact the cardiac imaging nurse navigator should you have any questions/concerns: Marchia Bond, Cardiac Imaging Nurse Navigator Gordy Clement, Cardiac Imaging Nurse Navigator [facility] Direct Office Dial: [phone]   For scheduling needs, including cancellations and rescheduling, please call [place], [phone].

## 2021-12-17 ENCOUNTER — Telehealth: Payer: Self-pay | Admitting: Internal Medicine

## 2021-12-17 NOTE — Telephone Encounter (Signed)
° °  Per MyChart scheduling  message:  I cannot take the new medication, Imdur. I have been sick with a headache all morning. Please give Dr. Caryl Comes the message.

## 2021-12-18 NOTE — Telephone Encounter (Signed)
Spoke with pt who reports she cannot take Imdur 30mg  due to severe headache and dizziness.  Pt advised side effects may subside after taking medication for a few days.  Pt states she cannot tolerate medication and is not willing to continue.  Imdur removed from pt's medication list.  Will make Dr Caryl Comes aware.  Pt thanked Therapist, sports for the callback.

## 2021-12-24 ENCOUNTER — Ambulatory Visit (INDEPENDENT_AMBULATORY_CARE_PROVIDER_SITE_OTHER): Payer: PPO | Admitting: Internal Medicine

## 2021-12-24 ENCOUNTER — Other Ambulatory Visit: Payer: Self-pay | Admitting: Neurology

## 2021-12-24 ENCOUNTER — Other Ambulatory Visit: Payer: Self-pay

## 2021-12-24 ENCOUNTER — Other Ambulatory Visit: Payer: Self-pay | Admitting: Internal Medicine

## 2021-12-24 ENCOUNTER — Encounter: Payer: Self-pay | Admitting: Internal Medicine

## 2021-12-24 DIAGNOSIS — G43719 Chronic migraine without aura, intractable, without status migrainosus: Secondary | ICD-10-CM | POA: Diagnosis not present

## 2021-12-24 DIAGNOSIS — G8929 Other chronic pain: Secondary | ICD-10-CM

## 2021-12-24 DIAGNOSIS — M5442 Lumbago with sciatica, left side: Secondary | ICD-10-CM

## 2021-12-24 DIAGNOSIS — M5441 Lumbago with sciatica, right side: Secondary | ICD-10-CM | POA: Diagnosis not present

## 2021-12-24 DIAGNOSIS — R002 Palpitations: Secondary | ICD-10-CM | POA: Diagnosis not present

## 2021-12-24 DIAGNOSIS — R635 Abnormal weight gain: Secondary | ICD-10-CM

## 2021-12-24 MED ORDER — OXYCODONE HCL 10 MG PO TABS: 10.0000 mg | ORAL_TABLET | Freq: Four times a day (QID) | ORAL | 0 refills | Status: DC | PRN

## 2021-12-24 NOTE — Assessment & Plan Note (Signed)
On verapamil, trazodone Maxalt as needed

## 2021-12-24 NOTE — Assessment & Plan Note (Signed)
Wt Readings from Last 3 Encounters:  12/24/21 152 lb 12.8 oz (69.3 kg)  12/16/21 153 lb 12.8 oz (69.8 kg)  10/30/21 159 lb (72.1 kg)

## 2021-12-24 NOTE — Progress Notes (Signed)
Subjective:  Patient ID: Beth Huffman, female    DOB: Jun 21, 1961  Age: 61 y.o. MRN: [id number]  CC: Follow-up (Would like to medical support host. )   HPI HARRIETT AZAR presents for chronic pain, LBP, HAs  Outpatient Medications Prior to Visit  Medication Sig Dispense Refill   amoxicillin (AMOXIL) 500 MG capsule SMARTSIG:4 Capsule(s) By Mouth     BOTOX 200 units SOLR Inject 155 units IM into multiple site in the face,neck and head once every 90 days 1 each 4   cholecalciferol (VITAMIN D) 1000 UNITS tablet Take 1,000 Units by mouth daily.     cyclobenzaprine (FLEXERIL) 5 MG tablet TAKE 1 TABLET THREE TIMES DAILY AS NEEDED FOR MUSCLE SPASMS. 90 tablet 3   estradiol (ESTRACE) 0.5 MG tablet Take 1 tablet (0.5 mg total) by mouth daily. 90 tablet 0   famotidine (PEPCID) 40 MG tablet Take 1 tablet (40 mg total) by mouth daily. 90 tablet 3   ibuprofen (IBU) 600 MG tablet TAKE 1 TABLET EVERY 8 HOURS AS NEEDED FOR HEADACHE. 90 tablet 1   metoprolol tartrate (LOPRESSOR) 100 MG tablet Take 1 tablet by mouth 1.5-2 hours prior to testing. 1 tablet 0   Multiple Vitamin (MULTIVITAMIN PO) Take 1 tablet by mouth daily.     naproxen (NAPROSYN) 500 MG tablet TAKE 1 TABLET BY MOUTH TWICE DAILY WITH A MEAL. 60 tablet 3   ondansetron (ZOFRAN) 8 MG tablet TAKE ONE TABLET BY MOUTH EVERY 8 HOURS AS NEEDED FOR NAUSEA OR FOR VOMITING 21 tablet 3   Oxycodone HCl 10 MG TABS Take 1 tablet (10 mg total) by mouth every 6 (six) hours as needed. 120 tablet 0   Oxycodone HCl 10 MG TABS Take 1 tablet (10 mg total) by mouth 4 (four) times daily as needed. 120 tablet 0   pantoprazole (PROTONIX) 40 MG tablet Take 1 tablet (40 mg total) by mouth at bedtime. 90 tablet 3   phentermine (ADIPEX-P) 37.5 MG tablet Take 1 tablet (37.5 mg total) by mouth daily before breakfast. 30 tablet 2   polyethylene glycol (MIRALAX / GLYCOLAX) packet Take 8.5 g by mouth every other day.     predniSONE (DELTASONE) 50 MG tablet 1. Prednisone  50 mg - take 1 tablet by mouth 13 hours prior to testing   2. Take another Prednisone 50 mg 1 tablet by mouth 7 hours prior to testing   3. Take another Prednisone 50 mg 1 tablet by mouth 1 hour prior to testing 3 tablet 0   rizatriptan (MAXALT) 10 MG tablet TAKE 1 TABLET AT ONSET OF MIGRAINE- MAY REPEAT ONCE IN 2 HOURS. LIMIT 2/24 HOURS. AVOID DAILY USE. 8 tablet 3   SUMAtriptan (IMITREX) 100 MG tablet TAKE 1 TABLET AT ONSET OF MIGRAINE- MAY REPEAT ONCE IN 2 HOURS. LIMIT 2/24 HOURS. AVOID DAILY USE. 9 tablet 3   verapamil (CALAN-SR) 120 MG CR tablet TAKE ONE TABLET AT BEDTIME. 90 tablet 3   phentermine (ADIPEX-P) 37.5 MG tablet Take 1 tablet by mouth daily before breakfast.     No facility-administered medications prior to visit.    ROS: Review of Systems  Constitutional:  Positive for fatigue and fever. Negative for activity change, appetite change, chills and unexpected weight change.  HENT:  Negative for congestion, mouth sores and sinus pressure.   Eyes:  Negative for visual disturbance.  Respiratory:  Negative for cough and chest tightness.   Gastrointestinal:  Negative for abdominal pain and nausea.  Genitourinary:  Negative  for difficulty urinating, frequency and vaginal pain.  Musculoskeletal:  Positive for arthralgias, back pain and neck pain. Negative for gait problem.  Skin:  Negative for pallor and rash.  Neurological:  Positive for headaches. Negative for dizziness, tremors, weakness and numbness.  Psychiatric/Behavioral:  Negative for confusion, sleep disturbance and suicidal ideas. The patient is not nervous/anxious.    Objective:  BP 118/70 (BP Location: Right Arm, Patient Position: Sitting, Cuff Size: Large)    Pulse 88    Temp 98.7 F (37.1 C) (Oral)    Ht 5\' 5"  (1.651 m)    Wt 152 lb 12.8 oz (69.3 kg)    SpO2 96%    BMI 25.43 kg/m   BP Readings from Last 3 Encounters:  12/24/21 118/70  12/16/21 120/90  10/30/21 (!) 142/82    Wt Readings from Last 3 Encounters:   12/24/21 152 lb 12.8 oz (69.3 kg)  12/16/21 153 lb 12.8 oz (69.8 kg)  10/30/21 159 lb (72.1 kg)    Physical Exam Constitutional:      General: She is not in acute distress.    Appearance: Normal appearance. She is well-developed.  HENT:     Head: Normocephalic.     Right Ear: External ear normal.     Left Ear: External ear normal.     Nose: Nose normal.  Eyes:     General:        Right eye: No discharge.        Left eye: No discharge.     Conjunctiva/sclera: Conjunctivae normal.     Pupils: Pupils are equal, round, and reactive to light.  Neck:     Thyroid: No thyromegaly.     Vascular: No JVD.     Trachea: No tracheal deviation.  Cardiovascular:     Rate and Rhythm: Normal rate and regular rhythm.     Heart sounds: Normal heart sounds.  Pulmonary:     Effort: No respiratory distress.     Breath sounds: No stridor. No wheezing.  Abdominal:     General: Bowel sounds are normal. There is no distension.     Palpations: Abdomen is soft. There is no mass.     Tenderness: There is no abdominal tenderness. There is no guarding or rebound.  Musculoskeletal:        General: Tenderness present.     Cervical back: Normal range of motion and neck supple. No rigidity.  Lymphadenopathy:     Cervical: No cervical adenopathy.  Skin:    Findings: No erythema or rash.  Neurological:     Cranial Nerves: No cranial nerve deficit.     Motor: No abnormal muscle tone.     Coordination: Coordination normal.     Deep Tendon Reflexes: Reflexes normal.  Psychiatric:        Behavior: Behavior normal.        Thought Content: Thought content normal.        Judgment: Judgment normal.  Neck w/pain  Lab Results  Component Value Date   WBC 5.2 04/25/2021   HGB 13.3 04/25/2021   HCT 39.6 04/25/2021   PLT 210.0 04/25/2021   GLUCOSE 79 12/16/2021   CHOL 225 (H) 04/25/2021   TRIG 110.0 04/25/2021   HDL 70.00 04/25/2021   LDLDIRECT 130.2 12/21/2013   LDLCALC 133 (H) 04/25/2021   ALT 27  04/25/2021   AST 25 04/25/2021   NA 141 12/16/2021   K 4.4 12/16/2021   CL 101 12/16/2021   CREATININE 0.84 12/16/2021  BUN 14 12/16/2021   CO2 27 12/16/2021   TSH 1.81 04/25/2021   HGBA1C 5.5 03/31/2018    MM 3D SCREEN BREAST BILATERAL  Result Date: 09/10/2021 CLINICAL DATA:  Screening. EXAM: DIGITAL SCREENING BILATERAL MAMMOGRAM WITH TOMOSYNTHESIS AND CAD TECHNIQUE: Bilateral screening digital craniocaudal and mediolateral oblique mammograms were obtained. Bilateral screening digital breast tomosynthesis was performed. The images were evaluated with computer-aided detection. COMPARISON:  Previous exam(s). ACR Breast Density Category c: The breast tissue is heterogeneously dense, which may obscure small masses. FINDINGS: There are no findings suspicious for malignancy. IMPRESSION: No mammographic evidence of malignancy. A result letter of this screening mammogram will be mailed directly to the patient. RECOMMENDATION: Screening mammogram in one year. (Code:SM-B-01Y) BI-RADS CATEGORY  1: Negative. Electronically Signed   By: Lovey Newcomer M.D.   On: 09/10/2021 08:30    Assessment & Plan:   Problem List Items Addressed This Visit     Migraine, chronic, without aura, intractable (Chronic)    On verapamil, trazodone Maxalt as needed      LOW BACK PAIN    On Oxycodone  Potential benefits of a long term opioids use as well as potential risks (i.e. addiction risk, apnea etc) and complications (i.e. Somnolence, constipation and others) were explained to the patient and were aknowledged.      Palpitations    F/u Dr Caryl Comes      Weight gain    Wt Readings from Last 3 Encounters:  12/24/21 152 lb 12.8 oz (69.3 kg)  12/16/21 153 lb 12.8 oz (69.8 kg)  10/30/21 159 lb (72.1 kg)           No orders of the defined types were placed in this encounter.     Follow-up: No follow-ups on file.  Walker Kehr, MD

## 2021-12-24 NOTE — Assessment & Plan Note (Signed)
On Oxycodone  Potential benefits of a long term opioids use as well as potential risks (i.e. addiction risk, apnea etc) and complications (i.e. Somnolence, constipation and others) were explained to the patient and were aknowledged.  

## 2021-12-24 NOTE — Assessment & Plan Note (Signed)
F/u Dr Caryl Comes

## 2021-12-25 ENCOUNTER — Telehealth (HOSPITAL_COMMUNITY): Payer: Self-pay | Admitting: Emergency Medicine

## 2021-12-25 DIAGNOSIS — J300 Vasomotor rhinitis: Secondary | ICD-10-CM | POA: Diagnosis not present

## 2021-12-25 DIAGNOSIS — H1045 Other chronic allergic conjunctivitis: Secondary | ICD-10-CM | POA: Diagnosis not present

## 2021-12-25 DIAGNOSIS — T413X5A Adverse effect of local anesthetics, initial encounter: Secondary | ICD-10-CM | POA: Diagnosis not present

## 2021-12-25 DIAGNOSIS — R21 Rash and other nonspecific skin eruption: Secondary | ICD-10-CM | POA: Diagnosis not present

## 2021-12-25 DIAGNOSIS — T413X5D Adverse effect of local anesthetics, subsequent encounter: Secondary | ICD-10-CM | POA: Diagnosis not present

## 2021-12-25 NOTE — Telephone Encounter (Signed)
Attempted to call patient regarding upcoming cardiac CT appointment. °Left message on voicemail with name and callback number °Florencia Zaccaro RN Navigator Cardiac Imaging °[facility] °[phone] Office °[phone] Cell ° °

## 2021-12-26 ENCOUNTER — Other Ambulatory Visit: Payer: Self-pay

## 2021-12-26 ENCOUNTER — Ambulatory Visit (HOSPITAL_COMMUNITY)
Admission: RE | Admit: 2021-12-26 | Discharge: 2021-12-26 | Disposition: A | Discharge status: Home / Self Care | Payer: PPO | Source: Ambulatory Visit | Attending: Internal Medicine | Admitting: Internal Medicine

## 2021-12-26 DIAGNOSIS — R072 Precordial pain: Secondary | ICD-10-CM | POA: Insufficient documentation

## 2021-12-26 MED ORDER — NITROGLYCERIN 0.4 MG SL SUBL
0.8000 mg | SUBLINGUAL_TABLET | Freq: Once | SUBLINGUAL | Status: AC
  Administered 2021-12-26: 0.8 mg via SUBLINGUAL

## 2021-12-26 MED ORDER — NITROGLYCERIN 0.4 MG SL SUBL
SUBLINGUAL_TABLET | SUBLINGUAL | Status: AC
  Filled 2021-12-26: qty 2

## 2021-12-26 NOTE — Progress Notes (Signed)
Pt states feeling okay. Pt states a headache. Coffee provided to patient. Pt states her headache is nothing new ?

## 2021-12-27 ENCOUNTER — Telehealth: Payer: Self-pay | Admitting: Internal Medicine

## 2021-12-27 MED ORDER — IOHEXOL 350 MG/ML SOLN
100.0000 mL | Freq: Once | INTRAVENOUS | Status: AC | PRN
  Administered 2021-12-27: 100 mL via INTRAVENOUS

## 2021-12-27 NOTE — Telephone Encounter (Signed)
? ?  Valarie Merino with Radiology calling to give CT morph result ?

## 2021-12-27 NOTE — Telephone Encounter (Signed)
1. Abnormal soft tissue density prevascular lesion 1.5 cm in short ?axis, possibly a pathologically enlarged lymph node mediastinal ?mass. Malignancy is not excluded. This is only partially included on ?today's examination. Dedicated CT of the chest with contrast is ?recommended for complete characterization. ?2. Arterial phase enhancing lesion in the dome of the right hepatic ?lobe measuring 0.8 cm in diameter. Although most likely a flash ?filling hemangioma or similar benign lesion, hypervascular ?metastatic lesion or malignancy such as hepatocellular carcinoma ?cannot be readily excluded. Consider hepatic protocol MRI with and ?without contrast for definitive characterization. ?

## 2022-01-02 NOTE — Telephone Encounter (Signed)
Dr Caryl Comes has attempted phone call to pt to discuss results and left a voicemail that he will attempt to contact her 01/03/2022. ?

## 2022-01-03 ENCOUNTER — Encounter: Payer: Self-pay | Admitting: Internal Medicine

## 2022-01-03 NOTE — Progress Notes (Unsigned)
Have tried again on 3 occasions again today to reach the patient ?I have decided if unable to reach again this afternoon I will call her husband DPR in the am ?

## 2022-01-04 ENCOUNTER — Encounter: Payer: Self-pay | Admitting: Internal Medicine

## 2022-01-04 NOTE — Progress Notes (Signed)
Pt called back and relayed the concerns of the CT ?1) chest lesion needing CT chest with contrast ?2) hepatic lesion needing hepatic MRI with contrast ? ?We will schedule and have told her if we have problem with communication because of her phone we will call her husband  ? ? ?

## 2022-01-04 NOTE — Progress Notes (Unsigned)
Tried again and failed to reach pt -- straight to VM  called husband and he said he would call her and have her call me   gave her my cell  ?

## 2022-01-06 ENCOUNTER — Telehealth: Payer: Self-pay | Admitting: Internal Medicine

## 2022-01-06 ENCOUNTER — Encounter: Payer: Self-pay | Admitting: Internal Medicine

## 2022-01-06 DIAGNOSIS — J9859 Other diseases of mediastinum, not elsewhere classified: Secondary | ICD-10-CM

## 2022-01-06 DIAGNOSIS — R160 Hepatomegaly, not elsewhere classified: Secondary | ICD-10-CM

## 2022-01-06 DIAGNOSIS — K769 Liver disease, unspecified: Secondary | ICD-10-CM

## 2022-01-06 NOTE — Progress Notes (Signed)
Per Dr Olin Pia discussion with pt's PCP, Dr Alain Marion will contact pt and schedule further testing and follow up for pt.   ?

## 2022-01-06 NOTE — Telephone Encounter (Signed)
I will forward this to Dr. Caryl Comes in regard to if he has cleared the pt.  ?

## 2022-01-06 NOTE — Telephone Encounter (Signed)
Note ?1. Abnormal soft tissue density prevascular lesion 1.5 cm in short ?axis, possibly a pathologically enlarged lymph node mediastinal ?mass. Malignancy is not excluded. This is only partially included on ?today's examination. Dedicated CT of the chest with contrast is ?recommended for complete characterization. ?2. Arterial phase enhancing lesion in the dome of the right hepatic ?lobe measuring 0.8 cm in diameter. Although most likely a flash ?filling hemangioma or similar benign lesion, hypervascular ?metastatic lesion or malignancy such as hepatocellular carcinoma ?cannot be readily excluded. Consider hepatic protocol MRI with and ?without contrast for definitive characterization.  ?  ? ?Dr Caryl Comes asked me to take over and order tests. I spoke w/[name]. She will move her foot surgery date. ? ?Tests are ordered ? ?

## 2022-01-06 NOTE — Telephone Encounter (Signed)
?  Per MyChart scheduling message: ? ?Dr. Caryl Comes called me on Saturday concerning my CT results. I also forgot to ask if I am cleared to have foot surgery with Dr. Amalia Hailey on Thursday? ?Thank you, ?Beth Huffman  ? ? ?

## 2022-01-07 ENCOUNTER — Telehealth: Payer: Self-pay

## 2022-01-07 ENCOUNTER — Encounter: Payer: Self-pay | Admitting: Internal Medicine

## 2022-01-07 ENCOUNTER — Other Ambulatory Visit: Payer: Self-pay | Admitting: Internal Medicine

## 2022-01-07 DIAGNOSIS — M542 Cervicalgia: Secondary | ICD-10-CM

## 2022-01-07 MED ORDER — DIPHENHYDRAMINE HCL 50 MG PO TABS: ORAL_TABLET | ORAL | 1 refills | Status: DC

## 2022-01-07 MED ORDER — PREDNISONE 10 MG PO TABS: ORAL_TABLET | ORAL | 1 refills | Status: DC

## 2022-01-07 NOTE — Telephone Encounter (Signed)
Beth Huffman called to reschedule her surgery with Dr. Amalia Hailey on 01/09/2022. She stated her PCP and Cardiologist recommended her to wait 1 month for surgery. I have her rescheduled to 02/13/2022. Notified Dr. Amalia Hailey and Caren Griffins with [facility]  ?

## 2022-01-08 ENCOUNTER — Telehealth: Payer: Self-pay | Admitting: Internal Medicine

## 2022-01-08 ENCOUNTER — Other Ambulatory Visit: Payer: Self-pay | Admitting: Internal Medicine

## 2022-01-08 NOTE — Telephone Encounter (Signed)
Pt informing cma her cat scan is scheduled for Mon 01-08-2022 @ 2pm ? ?Pt states she has not been contacted for mri ?

## 2022-01-09 ENCOUNTER — Ambulatory Visit: Payer: PPO | Admitting: Internal Medicine

## 2022-01-09 ENCOUNTER — Telehealth: Payer: Self-pay | Admitting: Internal Medicine

## 2022-01-09 ENCOUNTER — Other Ambulatory Visit (INDEPENDENT_AMBULATORY_CARE_PROVIDER_SITE_OTHER): Payer: PPO

## 2022-01-09 ENCOUNTER — Other Ambulatory Visit: Payer: Self-pay

## 2022-01-09 DIAGNOSIS — M542 Cervicalgia: Secondary | ICD-10-CM

## 2022-01-09 LAB — BASIC METABOLIC PANEL
BUN: 14 mg/dL (ref 6–23)
CO2: 29 mEq/L (ref 19–32)
Calcium: 9.4 mg/dL (ref 8.4–10.5)
Chloride: 99 mEq/L (ref 96–112)
Creatinine, Ser: 0.81 mg/dL (ref 0.40–1.20)
GFR: 78.11 mL/min (ref >60.00)
Glucose, Bld: 109 mg/dL — ABNORMAL HIGH (ref 70–99)
Potassium: 4.1 mEq/L (ref 3.5–5.1)
Sodium: 134 mEq/L — ABNORMAL LOW (ref 135–145)

## 2022-01-09 NOTE — Telephone Encounter (Signed)
PT Visits today and leaves proof of immunization! PT had me make a copy and leave it in Dr.Plotnikov's mailbox!  ?

## 2022-01-13 ENCOUNTER — Ambulatory Visit (INDEPENDENT_AMBULATORY_CARE_PROVIDER_SITE_OTHER)
Admission: RE | Admit: 2022-01-13 | Discharge: 2022-01-13 | Disposition: A | Discharge status: Home / Self Care | Payer: PPO | Source: Ambulatory Visit | Attending: Internal Medicine | Admitting: Internal Medicine

## 2022-01-13 ENCOUNTER — Other Ambulatory Visit: Payer: Self-pay

## 2022-01-13 DIAGNOSIS — R918 Other nonspecific abnormal finding of lung field: Secondary | ICD-10-CM | POA: Diagnosis not present

## 2022-01-13 DIAGNOSIS — R160 Hepatomegaly, not elsewhere classified: Secondary | ICD-10-CM

## 2022-01-13 DIAGNOSIS — J9859 Other diseases of mediastinum, not elsewhere classified: Secondary | ICD-10-CM

## 2022-01-13 MED ORDER — DIPHENHYDRAMINE HCL 50 MG/ML IJ SOLN: 50.0000 mg | Freq: Once | INTRAMUSCULAR | Status: DC

## 2022-01-13 MED ORDER — DIPHENHYDRAMINE HCL 25 MG PO CAPS: 50.0000 mg | ORAL_CAPSULE | Freq: Once | ORAL | Status: DC

## 2022-01-13 MED ORDER — PREDNISONE 1 MG PO TABS: 50.0000 mg | ORAL_TABLET | Freq: Four times a day (QID) | ORAL | Status: DC

## 2022-01-13 MED ORDER — IOHEXOL 300 MG/ML  SOLN
75.0000 mL | Freq: Once | INTRAMUSCULAR | Status: AC | PRN
Start: 2022-01-13 — End: 2022-01-13
  Administered 2022-01-13: 75 mL via INTRAVENOUS

## 2022-01-14 ENCOUNTER — Encounter: Payer: Self-pay | Admitting: Internal Medicine

## 2022-01-14 ENCOUNTER — Ambulatory Visit: Payer: PPO | Admitting: Internal Medicine

## 2022-01-15 ENCOUNTER — Encounter: Payer: Self-pay | Admitting: Internal Medicine

## 2022-01-15 ENCOUNTER — Encounter: Payer: PPO | Admitting: Podiatry

## 2022-01-15 ENCOUNTER — Telehealth: Payer: Self-pay | Admitting: Internal Medicine

## 2022-01-15 NOTE — Telephone Encounter (Signed)
Pt requesting a cb w/ imaging results ?

## 2022-01-16 NOTE — Telephone Encounter (Signed)
Chest CT - unclear of the diagnosis yet. I'm waiting on her MRI report to come back. ?Thx ?

## 2022-01-20 ENCOUNTER — Encounter: Payer: Self-pay | Admitting: Internal Medicine

## 2022-01-20 ENCOUNTER — Other Ambulatory Visit: Payer: Self-pay | Admitting: Internal Medicine

## 2022-01-20 DIAGNOSIS — J9859 Other diseases of mediastinum, not elsewhere classified: Secondary | ICD-10-CM

## 2022-01-20 NOTE — Progress Notes (Signed)
PETCT

## 2022-01-21 ENCOUNTER — Encounter: Payer: Self-pay | Admitting: Internal Medicine

## 2022-01-21 ENCOUNTER — Ambulatory Visit (INDEPENDENT_AMBULATORY_CARE_PROVIDER_SITE_OTHER): Payer: PPO | Admitting: Internal Medicine

## 2022-01-21 ENCOUNTER — Telehealth: Payer: Self-pay | Admitting: Internal Medicine

## 2022-01-21 DIAGNOSIS — G43709 Chronic migraine without aura, not intractable, without status migrainosus: Secondary | ICD-10-CM

## 2022-01-21 DIAGNOSIS — Z780 Asymptomatic menopausal state: Secondary | ICD-10-CM | POA: Diagnosis not present

## 2022-01-21 DIAGNOSIS — J9859 Other diseases of mediastinum, not elsewhere classified: Secondary | ICD-10-CM

## 2022-01-21 DIAGNOSIS — J309 Allergic rhinitis, unspecified: Secondary | ICD-10-CM | POA: Insufficient documentation

## 2022-01-21 DIAGNOSIS — H1045 Other chronic allergic conjunctivitis: Secondary | ICD-10-CM | POA: Insufficient documentation

## 2022-01-21 DIAGNOSIS — T413X5A Adverse effect of local anesthetics, initial encounter: Secondary | ICD-10-CM | POA: Insufficient documentation

## 2022-01-21 DIAGNOSIS — J300 Vasomotor rhinitis: Secondary | ICD-10-CM | POA: Insufficient documentation

## 2022-01-21 HISTORY — DX: Adverse effect of local anesthetics, initial encounter: T41.3X5A

## 2022-01-21 MED ORDER — RIZATRIPTAN BENZOATE 10 MG PO TABS: ORAL_TABLET | ORAL | 3 refills | Status: DC

## 2022-01-21 MED ORDER — UBRELVY 100 MG PO TABS: 100.0000 mg | ORAL_TABLET | Freq: Every day | ORAL | 5 refills | Status: DC | PRN

## 2022-01-21 MED ORDER — ESTRADIOL 0.5 MG PO TABS: 0.5000 mg | ORAL_TABLET | Freq: Every day | ORAL | 0 refills | Status: DC

## 2022-01-21 NOTE — Assessment & Plan Note (Addendum)
Chronic and recurrent.  Rx for ubrelvy, Maxalt given ?

## 2022-01-21 NOTE — Assessment & Plan Note (Signed)
On HRT ?Premarin prescription renewed ?GYN appointment is pending ?

## 2022-01-21 NOTE — Telephone Encounter (Signed)
Called and states order was sent over for PSMA Scan- which is for prostate cancer.  ? ? ?Order is incorrect, and needs one refaxed for PET Scan is what is need,ed.  ? ?Fax #- [phone] ?

## 2022-01-21 NOTE — Progress Notes (Signed)
? ?Subjective:  ?Patient ID: Beth Huffman, female    DOB: [birth year]  Age: 61 y.o. MRN: [id number] ? ?CC: No chief complaint on file. ? ? ?HPI ?Beth Huffman presents for abn CT - mediastinal mass. She is here w/Beth Huffman, her husband ?F/u HAs, menopause ? ?Outpatient Medications Prior to Visit  ?Medication Sig Dispense Refill  ? BOTOX 200 units SOLR Inject 155 units IM into multiple site in the face,neck and head once every 90 days 1 each 4  ? cholecalciferol (VITAMIN D) 1000 UNITS tablet Take 1,000 Units by mouth daily.    ? cyclobenzaprine (FLEXERIL) 5 MG tablet TAKE 1 TABLET THREE TIMES DAILY AS NEEDED FOR MUSCLE SPASMS. 90 tablet 3  ? diphenhydrAMINE (BENADRYL) 50 MG tablet Take 50 mg of benadryl 1 hour before contrast injection 1 tablet 1  ? famotidine (PEPCID) 40 MG tablet Take 1 tablet (40 mg total) by mouth daily. 90 tablet 3  ? ibuprofen (IBU) 600 MG tablet TAKE 1 TABLET EVERY 8 HOURS AS NEEDED FOR HEADACHE. 90 tablet 0  ? Multiple Vitamin (MULTIVITAMIN PO) Take 1 tablet by mouth daily.    ? naproxen (NAPROSYN) 500 MG tablet TAKE 1 TABLET BY MOUTH TWICE DAILY WITH A MEAL. 60 tablet 3  ? ondansetron (ZOFRAN) 8 MG tablet TAKE ONE TABLET BY MOUTH EVERY 8 HOURS AS NEEDED FOR NAUSEA OR FOR VOMITING 21 tablet 3  ? Oxycodone HCl 10 MG TABS Take 1 tablet (10 mg total) by mouth every 6 (six) hours as needed. 120 tablet 0  ? Oxycodone HCl 10 MG TABS Take 1 tablet (10 mg total) by mouth 4 (four) times daily as needed. 120 tablet 0  ? pantoprazole (PROTONIX) 40 MG tablet Take 1 tablet (40 mg total) by mouth at bedtime. 90 tablet 3  ? polyethylene glycol (MIRALAX / GLYCOLAX) packet Take 8.5 g by mouth every other day.    ? predniSONE (DELTASONE) 10 MG tablet Take '50mg'$  of prednisone at hours 13, 7 and 1 prior to CT, and 50 mg of benadryl 1 hour before contrast injection 15 tablet 1  ? predniSONE (DELTASONE) 50 MG tablet 1. Prednisone 50 mg - take 1 tablet by mouth 13 hours prior to testing ?  2. Take another  Prednisone 50 mg 1 tablet by mouth 7 hours prior to testing ?  3. Take another Prednisone 50 mg 1 tablet by mouth 1 hour prior to testing 3 tablet 0  ? SHINGRIX injection     ? SUMAtriptan (IMITREX) 100 MG tablet TAKE 1 TABLET AT ONSET OF MIGRAINE- MAY REPEAT ONCE IN 2 HOURS. LIMIT 2/24 HOURS. AVOID DAILY USE. 9 tablet 3  ? verapamil (CALAN-SR) 120 MG CR tablet TAKE ONE TABLET AT BEDTIME. 90 tablet 3  ? estradiol (ESTRACE) 0.5 MG tablet Take 1 tablet (0.5 mg total) by mouth daily. 90 tablet 0  ? NURTEC 75 MG TBDP TAKE ONE TABLET BY MOUTH AS NEEDED. 16 tablet 5  ? rizatriptan (MAXALT) 10 MG tablet TAKE 1 TABLET AT ONSET OF MIGRAINE- MAY REPEAT ONCE IN 2 HOURS. LIMIT 2/24 HOURS. AVOID DAILY USE. 8 tablet 3  ? phentermine (ADIPEX-P) 37.5 MG tablet Take 1 tablet (37.5 mg total) by mouth daily before breakfast. 30 tablet 2  ? amoxicillin (AMOXIL) 500 MG capsule SMARTSIG:4 Capsule(s) By Mouth    ? metoprolol tartrate (LOPRESSOR) 100 MG tablet Take 1 tablet by mouth 1.5-2 hours prior to testing. 1 tablet 0  ? ?No facility-administered medications prior to visit.  ? ? ?  ROS: ?Review of Systems  ?Constitutional:  Positive for fatigue. Negative for activity change, appetite change, chills and unexpected weight change.  ?HENT:  Negative for congestion, mouth sores and sinus pressure.   ?Eyes:  Negative for visual disturbance.  ?Respiratory:  Negative for cough, chest tightness and wheezing.   ?Cardiovascular:  Negative for leg swelling.  ?Gastrointestinal:  Negative for abdominal pain and nausea.  ?Genitourinary:  Negative for difficulty urinating, frequency and vaginal pain.  ?Musculoskeletal:  Positive for back pain, neck pain and neck stiffness. Negative for gait problem.  ?Skin:  Negative for pallor and rash.  ?Neurological:  Positive for headaches. Negative for dizziness, tremors, weakness and numbness.  ?Psychiatric/Behavioral:  Negative for confusion and sleep disturbance.   ? ?Objective:  ?BP 122/82 (BP Location: Left  Arm, Patient Position: Sitting, Cuff Size: Large)   Pulse 76   Temp 98.1 ?F (36.7 ?C) (Oral)   Ht '5\' 5"'$  (1.651 m)   Wt 142 lb (64.4 kg)   SpO2 96%   BMI 23.63 kg/m?  ? ?BP Readings from Last 3 Encounters:  ?01/21/22 122/82  ?12/26/21 116/71  ?12/24/21 118/70  ? ? ?Wt Readings from Last 3 Encounters:  ?01/21/22 142 lb (64.4 kg)  ?12/24/21 152 lb 12.8 oz (69.3 kg)  ?12/16/21 153 lb 12.8 oz (69.8 kg)  ? ? ?Physical Exam ?Constitutional:   ?   General: She is not in acute distress. ?   Appearance: She is well-developed.  ?HENT:  ?   Head: Normocephalic.  ?   Right Ear: External ear normal.  ?   Left Ear: External ear normal.  ?   Nose: Nose normal.  ?Eyes:  ?   General:     ?   Right eye: No discharge.     ?   Left eye: No discharge.  ?   Conjunctiva/sclera: Conjunctivae normal.  ?   Pupils: Pupils are equal, round, and reactive to light.  ?Neck:  ?   Thyroid: No thyromegaly.  ?   Vascular: No JVD.  ?   Trachea: No tracheal deviation.  ?Cardiovascular:  ?   Rate and Rhythm: Normal rate and regular rhythm.  ?   Heart sounds: Normal heart sounds.  ?Pulmonary:  ?   Effort: No respiratory distress.  ?   Breath sounds: No stridor. No wheezing.  ?Abdominal:  ?   General: Bowel sounds are normal. There is no distension.  ?   Palpations: Abdomen is soft. There is no mass.  ?   Tenderness: There is no abdominal tenderness. There is no guarding or rebound.  ?Musculoskeletal:     ?   General: No tenderness.  ?   Cervical back: Normal range of motion and neck supple. No rigidity.  ?Lymphadenopathy:  ?   Cervical: No cervical adenopathy.  ?Skin: ?   Findings: No erythema or rash.  ?Neurological:  ?   Cranial Nerves: No cranial nerve deficit.  ?   Motor: No abnormal muscle tone.  ?   Coordination: Coordination normal.  ?   Deep Tendon Reflexes: Reflexes normal.  ?Psychiatric:     ?   Behavior: Behavior normal.     ?   Thought Content: Thought content normal.     ?   Judgment: Judgment normal.  ? ? ?Lab Results  ?Component Value  Date  ? WBC 5.2 04/25/2021  ? HGB 13.3 04/25/2021  ? HCT 39.6 04/25/2021  ? PLT 210.0 04/25/2021  ? GLUCOSE 109 (H) 01/09/2022  ? CHOL 225 (  H) 04/25/2021  ? TRIG 110.0 04/25/2021  ? HDL 70.00 04/25/2021  ? LDLDIRECT 130.2 12/21/2013  ? LDLCALC 133 (H) 04/25/2021  ? ALT 27 04/25/2021  ? AST 25 04/25/2021  ? NA 134 (L) 01/09/2022  ? K 4.1 01/09/2022  ? CL 99 01/09/2022  ? CREATININE 0.81 01/09/2022  ? BUN 14 01/09/2022  ? CO2 29 01/09/2022  ? TSH 1.81 04/25/2021  ? HGBA1C 5.5 03/31/2018  ? ? ?CT Chest W Contrast ? ?Result Date: 01/16/2022 ?CLINICAL DATA:  Prevascular mass on cardiac CT. EXAM: CT CHEST WITH CONTRAST TECHNIQUE: Multidetector CT imaging of the chest was performed during intravenous contrast administration. RADIATION DOSE REDUCTION: This exam was performed according to the departmental dose-optimization program which includes automated exposure control, adjustment of the mA and/or kV according to patient size and/or use of iterative reconstruction technique. CONTRAST:  51m OMNIPAQUE IOHEXOL 300 MG/ML  SOLN COMPARISON:  Partial chest CT for cardiac workup on 12/26/2021 is the only prior chest imaging. FINDINGS: Cardiovascular: The cardiac size is normal. There is no pericardial effusion. There is no visible coronary artery calcification. Small amount of calcific atherosclerosis seen in the arch and descending aorta. No dissection, aneurysm or penetrating ulcer is seen. The great vessels are within normal limits. Mediastinum/Nodes: 8.2 mm heterogeneous nodule left lobe of the thyroid gland. Thyroid is otherwise unremarkable. No follow-up imaging recommended. The trachea, thoracic esophagus and axillary spaces are unremarkable. There is a prevascular space ovoid slightly lobular mass measuring 2.0 x 1.3 cm, post-contrast enhancement density of 60 Hounsfield units. There is no further evidence of intrathoracic adenopathy. Lungs/Pleura: In the left mid lung, there is a 5 mm fissural noncalcified nodule on 3:62  and a 3.0 mm fissural nodule on 3:78. There are no further nodules.  No infiltrates or fibrotic changes. The central airways and lungs are otherwise clear. There is no pleural effusion, thickening or pneumothorax

## 2022-01-21 NOTE — Assessment & Plan Note (Addendum)
New.  Discussed possible etiologies and work-up with the patient and her husband ?PET-CT is ordered; labs ordered ?MRI is on hold, likely we will not needed. ?It is a complex situation. ?

## 2022-01-22 ENCOUNTER — Other Ambulatory Visit: Payer: Self-pay | Admitting: Internal Medicine

## 2022-01-24 ENCOUNTER — Other Ambulatory Visit: Payer: PPO

## 2022-01-24 NOTE — Telephone Encounter (Signed)
Done. Thx.

## 2022-01-25 ENCOUNTER — Encounter: Payer: Self-pay | Admitting: Internal Medicine

## 2022-01-27 ENCOUNTER — Encounter: Payer: PPO | Admitting: Podiatry

## 2022-01-30 ENCOUNTER — Ambulatory Visit: Payer: PPO | Admitting: Neurology

## 2022-02-03 NOTE — Telephone Encounter (Signed)
Pt was advise of PET SCAN.. old msg. Closing encounter.Marland KitchenJohny Chess ? ? ? ? ?Asa Saunas "[name]" ?to [place] [facility] (supporting Deboraha Sprang, MD)   ?   9:49 PM ?Thank you, Rosann Auerbach. It will be here soon now. I appreciate your help and efforts. ?[name] ?Thora Lance, RN ?to Asa Saunas "[name]" and proxy Thyra Breed Hullinger)   ?   6:35 PM ?Good Evening, ?  ?I certainly understand.  I would plan to keep the appointment for the PET at [facility]  The turn around time is about the same everywhere and even longer at [facility] but I will do some checking for you.   ?  ?Take Care, ?  ?Rosann Auerbach R,RN ?

## 2022-02-10 ENCOUNTER — Ambulatory Visit (HOSPITAL_COMMUNITY)
Admission: RE | Admit: 2022-02-10 | Discharge: 2022-02-10 | Disposition: A | Discharge status: Home / Self Care | Payer: PPO | Source: Ambulatory Visit | Attending: Internal Medicine | Admitting: Internal Medicine

## 2022-02-10 ENCOUNTER — Encounter: Payer: PPO | Admitting: Podiatry

## 2022-02-10 DIAGNOSIS — J9859 Other diseases of mediastinum, not elsewhere classified: Secondary | ICD-10-CM | POA: Diagnosis not present

## 2022-02-10 DIAGNOSIS — N8110 Cystocele, unspecified: Secondary | ICD-10-CM | POA: Diagnosis not present

## 2022-02-10 DIAGNOSIS — I700 Atherosclerosis of aorta: Secondary | ICD-10-CM | POA: Diagnosis not present

## 2022-02-10 DIAGNOSIS — R222 Localized swelling, mass and lump, trunk: Secondary | ICD-10-CM | POA: Diagnosis not present

## 2022-02-10 LAB — GLUCOSE, CAPILLARY: Glucose-Capillary: 125 mg/dL — ABNORMAL HIGH (ref 70–99)

## 2022-02-10 MED ORDER — FLUDEOXYGLUCOSE F - 18 (FDG) INJECTION
6.8500 | Freq: Once | INTRAVENOUS | Status: AC
  Administered 2022-02-10: 6.85 via INTRAVENOUS

## 2022-02-11 ENCOUNTER — Encounter: Payer: Self-pay | Admitting: Internal Medicine

## 2022-02-13 ENCOUNTER — Ambulatory Visit (INDEPENDENT_AMBULATORY_CARE_PROVIDER_SITE_OTHER): Payer: PPO | Admitting: Obstetrics & Gynecology

## 2022-02-13 ENCOUNTER — Encounter: Payer: Self-pay | Admitting: Obstetrics & Gynecology

## 2022-02-13 VITALS — BP 122/76 | HR 74 | Resp 16 | Ht 63.25 in | Wt 147.0 lb

## 2022-02-13 DIAGNOSIS — Z79890 Hormone replacement therapy (postmenopausal): Secondary | ICD-10-CM

## 2022-02-13 DIAGNOSIS — Z90710 Acquired absence of both cervix and uterus: Secondary | ICD-10-CM | POA: Diagnosis not present

## 2022-02-13 DIAGNOSIS — Z01419 Encounter for gynecological examination (general) (routine) without abnormal findings: Secondary | ICD-10-CM | POA: Diagnosis not present

## 2022-02-13 MED ORDER — ESTRADIOL 0.5 MG PO TABS: 0.5000 mg | ORAL_TABLET | Freq: Every day | ORAL | 4 refills | Status: DC

## 2022-02-13 NOTE — Progress Notes (Signed)
? ? ?Beth Huffman 08/27/1961 [id number] ? ? ?History:    61 y.o. G2P2L2 Married.  Daughter 59 yo, son 72 yo.  3 grand-children. ?  ?RP:  Established patient presenting for annual gyn exam  ?  ?HPI: Postmenopause, well on Estradiol 0.5 mg tab daily currently x about 8 yrs.  No Menopausal Sx.  S/P Vaginal Hyst in 90's and Sacrocolpopexy/BSO/Ant Repair/Perineoplasty/TVT 06/2016.  Urinary urgency stable.  No pelvic pain.  Pap Neg 01/2020. Breasts wnl. Mammo Neg 08/2021. BMI 25.83.  Swimming and walking the dog 3x a week.  Health labs with Fam MD.  COLONOSCOPY: 10-22-16, COLOGUARD 09-21-19 ? ?Past medical history,surgical history, family history and social history were all reviewed and documented in the EPIC chart. ? ?Gynecologic History ?No LMP recorded. Patient has had a hysterectomy. ? ?Obstetric History ?OB History  ?Gravida Para Term Preterm AB Living  ?'2 2       2  '$ ?SAB IAB Ectopic Multiple Live Births  ?           ?  ?# Outcome Date GA Lbr Len/2nd Weight Sex Delivery Anes PTL Lv  ?2 Para           ?1 Para           ? ? ? ?ROS: A ROS was performed and pertinent positives and negatives are included in the history. ? GENERAL: No fevers or chills. HEENT: No change in vision, no earache, sore throat or sinus congestion. NECK: No pain or stiffness. CARDIOVASCULAR: No chest pain or pressure. No palpitations. PULMONARY: No shortness of breath, cough or wheeze. GASTROINTESTINAL: No abdominal pain, nausea, vomiting or diarrhea, melena or bright red blood per rectum. GENITOURINARY: No urinary frequency, urgency, hesitancy or dysuria. MUSCULOSKELETAL: No joint or muscle pain, no back pain, no recent trauma. DERMATOLOGIC: No rash, no itching, no lesions. ENDOCRINE: No polyuria, polydipsia, no heat or cold intolerance. No recent change in weight. HEMATOLOGICAL: No anemia or easy bruising or bleeding. NEUROLOGIC: No headache, seizures, numbness, tingling or weakness. PSYCHIATRIC: No depression, no loss of interest in normal  activity or change in sleep pattern.  ?  ? ?Exam: ? ? ?BP 122/76   Pulse 74   Resp 16   Ht 5' 3.25" (1.607 m)   Wt 147 lb (66.7 kg)   BMI 25.83 kg/m?  ? ?Body mass index is 25.83 kg/m?. ? ?General appearance : Well developed well nourished female. No acute distress ?HEENT: Eyes: no retinal hemorrhage or exudates,  Neck supple, trachea midline, no carotid bruits, no thyroidmegaly ?Lungs: Clear to auscultation, no rhonchi or wheezes, or rib retractions  ?Heart: Regular rate and rhythm, no murmurs or gallops ?Breast:Examined in sitting and supine position were symmetrical in appearance, no palpable masses or tenderness,  no skin retraction, no nipple inversion, no nipple discharge, no skin discoloration, no axillary or supraclavicular lymphadenopathy ?Abdomen: no palpable masses or tenderness, no rebound or guarding ?Extremities: no edema or skin discoloration or tenderness ? ?Pelvic: Vulva: Normal ?            Vagina: No gross lesions or discharge ? Cervix/Uterus absent ? Adnexa  Without masses or tenderness ? Anus: Normal ? ? ?Assessment/Plan:  61 y.o. female for annual exam  ? ?1. Well female exam with routine gynecological exam ?Postmenopause, well on Estradiol 0.5 mg tab daily currently x about 8 yrs.  No Menopausal Sx.  S/P Vaginal Hyst in 90's and Sacrocolpopexy/BSO/Ant Repair/Perineoplasty/TVT 06/2016.  Urinary urgency stable.  No pelvic pain.  Pap  Neg 01/2020. Breasts wnl. Mammo Neg 08/2021. BMI 25.83.  Swimming and walking the dog 3x a week.  Health labs with Fam MD.  COLONOSCOPY: 10-22-16, COLOGUARD 09-21-19 ? ?2. S/P total hysterectomy ? ?3. Postmenopausal hormone replacement therapy ?Postmenopause, well on Estradiol 0.5 mg tab daily currently x about 8 yrs.  No Menopausal Sx on that Estradiol dosage.  S/P Total Hysterectomy. No CI to continue.  Prescription sent to pharmacy. ? ?Other orders ?- NURTEC 75 MG TBDP; Take 1 tablet by mouth every other day. ?- estradiol (ESTRACE) 0.5 MG tablet; Take 1 tablet  (0.5 mg total) by mouth daily.  ? ?Princess Bruins MD, 8:45 AM 02/13/2022 ? ?  ?

## 2022-02-17 ENCOUNTER — Telehealth: Payer: PPO | Admitting: Physician Assistant

## 2022-02-17 DIAGNOSIS — Z20818 Contact with and (suspected) exposure to other bacterial communicable diseases: Secondary | ICD-10-CM | POA: Diagnosis not present

## 2022-02-17 DIAGNOSIS — J029 Acute pharyngitis, unspecified: Secondary | ICD-10-CM | POA: Diagnosis not present

## 2022-02-17 MED ORDER — AMOXICILLIN 500 MG PO TABS: 500.0000 mg | ORAL_TABLET | Freq: Two times a day (BID) | ORAL | 0 refills | Status: AC

## 2022-02-17 NOTE — Progress Notes (Signed)
I have spent 5 minutes in review of e-visit questionnaire, review and updating patient chart, medical decision making and response to patient.   Yachet Mattson Cody Destynie Toomey, PA-C    

## 2022-02-17 NOTE — Progress Notes (Signed)

## 2022-02-19 ENCOUNTER — Encounter: Payer: PPO | Admitting: Podiatry

## 2022-02-22 ENCOUNTER — Other Ambulatory Visit: Payer: Self-pay | Admitting: Internal Medicine

## 2022-02-24 ENCOUNTER — Other Ambulatory Visit: Payer: Self-pay | Admitting: Internal Medicine

## 2022-02-24 MED ORDER — OXYCODONE HCL 20 MG PO TABS: 10.0000 mg | ORAL_TABLET | Freq: Four times a day (QID) | ORAL | 0 refills | Status: DC | PRN

## 2022-02-24 NOTE — Telephone Encounter (Signed)
Pt states pharmacy does not have oxycodone 10 mg in stock,but does have 20 mg ? ?Pt states she has been trying to have rx filled since 4-27 and is in pain  ? ?Pt states oxycodone is scored and can half equaling 10 mg can be taken ? ?Please advise ?

## 2022-02-24 NOTE — Progress Notes (Signed)
Rx

## 2022-02-25 ENCOUNTER — Telehealth: Payer: Self-pay | Admitting: Internal Medicine

## 2022-02-25 ENCOUNTER — Telehealth: Payer: Self-pay | Admitting: *Deleted

## 2022-02-25 NOTE — Telephone Encounter (Signed)
I did on 02/24/2022.  Thanks ?

## 2022-02-25 NOTE — Telephone Encounter (Signed)
? ?  Pre-operative Risk Assessment  ?  ?Patient Name: Beth Huffman  ?DOB: [birth year] ?MRN: [id number]  ? ?  ? ?Request for Surgical Clearance   ? ?Procedure:   [place] Bunionectomy and Metatarsal Osteotomy and Hammer Toe Repair ? ?Date of Surgery:  Clearance 05/15/22                              ?   ?Surgeon:  Dr. Daylene Katayama ?Surgeon's Group or Practice Name:  [facility] ?Phone number:  [phone] ?Fax number:  [phone] ?  ?Type of Clearance Requested:   ?- Medical  ?  ?Type of Anesthesia:   Choice ?  ?Additional requests/questions:  Please advise surgeon/provider what medications should be held. ? ?Signed, ?Belisicia T Harris   ?02/25/2022, 2:51 PM  ? ?

## 2022-02-25 NOTE — Telephone Encounter (Signed)
? ? ?  Name: Beth Huffman  ?DOB: 05/17/1961  ?MRN: [id number] ? ?Primary Cardiologist: None ? ? ?Preoperative team, please contact this patient and set up a phone call appointment for further preoperative risk assessment. Please obtain consent and complete medication review. Thank you for your help. ? ?I confirm that guidance regarding antiplatelet and oral anticoagulation therapy has been completed and, if necessary, noted below. ? ? ? ?Mable Fill, Marissa Nestle, NP ?02/25/2022, 3:06 PM ?[place] ?[address] ?[place], [place] [address] ?  ?

## 2022-02-25 NOTE — Telephone Encounter (Signed)
Rec'd fax stating pt is requesting 20 mg of oxy, but since we are not able to get twenty can you send rx for 10 mg../lmb ?

## 2022-02-26 ENCOUNTER — Telehealth: Payer: Self-pay

## 2022-02-26 ENCOUNTER — Encounter: Payer: PPO | Admitting: Podiatry

## 2022-02-26 ENCOUNTER — Other Ambulatory Visit: Payer: Self-pay | Admitting: Internal Medicine

## 2022-02-26 NOTE — Telephone Encounter (Signed)
Left a message for the patient to contact the office.  ?

## 2022-02-26 NOTE — Telephone Encounter (Signed)
Patient is returning call, transferred to [facility] ?

## 2022-02-26 NOTE — Telephone Encounter (Signed)
?  Patient Consent for Virtual Visit  ? ? ?    ? ?Beth Huffman has provided verbal consent on 02/26/2022 for a virtual visit (video or telephone). ? ? ?CONSENT FOR VIRTUAL VISIT FOR:  Beth Huffman  ?By participating in this virtual visit I agree to the following: ? ?I hereby voluntarily request, consent and authorize [facility] and its employed or contracted physicians, physician assistants, nurse practitioners or other licensed health care professionals (the Practitioner), to provide me with telemedicine health care services (the ?Services") as deemed necessary by the treating Practitioner. I acknowledge and consent to receive the Services by the Practitioner via telemedicine. I understand that the telemedicine visit will involve communicating with the Practitioner through live audiovisual communication technology and the disclosure of certain medical information by electronic transmission. I acknowledge that I have been given the opportunity to request an in-person assessment or other available alternative prior to the telemedicine visit and am voluntarily participating in the telemedicine visit. ? ?I understand that I have the right to withhold or withdraw my consent to the use of telemedicine in the course of my care at any time, without affecting my right to future care or treatment, and that the Practitioner or I may terminate the telemedicine visit at any time. I understand that I have the right to inspect all information obtained and/or recorded in the course of the telemedicine visit and may receive copies of available information for a reasonable fee.  I understand that some of the potential risks of receiving the Services via telemedicine include:  ?Delay or interruption in medical evaluation due to technological equipment failure or disruption; ?Information transmitted may not be sufficient (e.g. poor resolution of images) to allow for appropriate medical decision making by the Practitioner;  and/or  ?In rare instances, security protocols could fail, causing a breach of personal health information. ? ?Furthermore, I acknowledge that it is my responsibility to provide information about my medical history, conditions and care that is complete and accurate to the best of my ability. I acknowledge that Practitioner's advice, recommendations, and/or decision may be based on factors not within their control, such as incomplete or inaccurate data provided by me or distortions of diagnostic images or specimens that may result from electronic transmissions. I understand that the practice of medicine is not an exact science and that Practitioner makes no warranties or guarantees regarding treatment outcomes. I acknowledge that a copy of this consent can be made available to me via my patient portal ([facility]), or I can request a printed copy by calling the office of [facility].   ? ?I understand that my insurance will be billed for this visit.  ? ?I have read or had this consent read to me. ?I understand the contents of this consent, which adequately explains the benefits and risks of the Services being provided via telemedicine.  ?I have been provided ample opportunity to ask questions regarding this consent and the Services and have had my questions answered to my satisfaction. ?I give my informed consent for the services to be provided through the use of telemedicine in my medical care ? ? ?

## 2022-02-26 NOTE — Telephone Encounter (Signed)
Spoke with patient and schedule telehealth appt. Medication list reviewed and consent given.  ?Patient voiced understanding.  ?

## 2022-02-27 NOTE — Telephone Encounter (Signed)
The original prescription was discontinued on 02/13/2022 by Susy Manor, CMA. Check [place] registry last filled  02/24/2022. Denied refill MD filled '20mg'$  on 02/24/22.../lmb ? ?

## 2022-03-05 ENCOUNTER — Ambulatory Visit (INDEPENDENT_AMBULATORY_CARE_PROVIDER_SITE_OTHER): Payer: PPO | Admitting: Internal Medicine

## 2022-03-05 ENCOUNTER — Encounter: Payer: Self-pay | Admitting: Internal Medicine

## 2022-03-05 ENCOUNTER — Telehealth: Payer: Self-pay

## 2022-03-05 DIAGNOSIS — M5442 Lumbago with sciatica, left side: Secondary | ICD-10-CM | POA: Diagnosis not present

## 2022-03-05 DIAGNOSIS — M5441 Lumbago with sciatica, right side: Secondary | ICD-10-CM | POA: Diagnosis not present

## 2022-03-05 DIAGNOSIS — G43719 Chronic migraine without aura, intractable, without status migrainosus: Secondary | ICD-10-CM | POA: Diagnosis not present

## 2022-03-05 DIAGNOSIS — G43709 Chronic migraine without aura, not intractable, without status migrainosus: Secondary | ICD-10-CM | POA: Diagnosis not present

## 2022-03-05 DIAGNOSIS — R0609 Other forms of dyspnea: Secondary | ICD-10-CM

## 2022-03-05 DIAGNOSIS — G8929 Other chronic pain: Secondary | ICD-10-CM | POA: Diagnosis not present

## 2022-03-05 MED ORDER — OXYCODONE HCL 20 MG PO TABS: 10.0000 mg | ORAL_TABLET | Freq: Four times a day (QID) | ORAL | 0 refills | Status: DC | PRN

## 2022-03-05 NOTE — Patient Instructions (Signed)
Stop Phentermine for 1-2 weeks ?

## 2022-03-05 NOTE — Assessment & Plan Note (Signed)
On Rx 

## 2022-03-05 NOTE — Assessment & Plan Note (Signed)
On Oxycodone  Potential benefits of a long term opioids use as well as potential risks (i.e. addiction risk, apnea etc) and complications (i.e. Somnolence, constipation and others) were explained to the patient and were aknowledged.  

## 2022-03-05 NOTE — Telephone Encounter (Signed)
Telephone call to [facility], to check on status of Botox delivery. ? ?Per REP Dwyane Luo T, Patient was called earlier today to collect copay, Patient advised she will not be able to afford the copay $300 at this time and declined Assistance program offered to her. ? ?Tried calling patient to speak to her and find out if she wanted to come on the 19th to speak to [facility] instead doing a Botox that day. ?No answer. LMOVM to call the office back.  ?

## 2022-03-05 NOTE — Progress Notes (Signed)
? ?Subjective:  ?Patient ID: Beth Huffman, female    DOB: 08/01/1961  Age: 61 y.o. MRN: [id number] ? ?CC: No chief complaint on file. ? ? ?HPI ?Beth Huffman presents for chronic pain, DOE ?Pt lost 30 lbs on Phentermine ? ?Outpatient Medications Prior to Visit  ?Medication Sig Dispense Refill  ? BOTOX 200 units SOLR Inject 155 units IM into multiple site in the face,neck and head once every 90 days 1 each 4  ? cholecalciferol (VITAMIN D) 1000 UNITS tablet Take 1,000 Units by mouth daily.    ? cyclobenzaprine (FLEXERIL) 5 MG tablet TAKE 1 TABLET THREE TIMES DAILY AS NEEDED FOR MUSCLE SPASMS. 90 tablet 3  ? diphenhydrAMINE (BENADRYL) 50 MG tablet Take 50 mg of benadryl 1 hour before contrast injection 1 tablet 1  ? estradiol (ESTRACE) 0.5 MG tablet Take 1 tablet (0.5 mg total) by mouth daily. 90 tablet 4  ? famotidine (PEPCID) 40 MG tablet Take 1 tablet (40 mg total) by mouth daily. 90 tablet 3  ? ibuprofen (IBU) 600 MG tablet TAKE 1 TABLET EVERY 8 HOURS AS NEEDED FOR HEADACHE. 90 tablet 1  ? Multiple Vitamin (MULTIVITAMIN PO) Take 1 tablet by mouth daily.    ? NURTEC 75 MG TBDP Take 1 tablet by mouth every other day.    ? ondansetron (ZOFRAN) 8 MG tablet TAKE ONE TABLET BY MOUTH EVERY 8 HOURS AS NEEDED FOR NAUSEA OR FOR VOMITING 21 tablet 3  ? pantoprazole (PROTONIX) 40 MG tablet Take 1 tablet (40 mg total) by mouth at bedtime. 90 tablet 3  ? polyethylene glycol (MIRALAX / GLYCOLAX) packet Take 8.5 g by mouth every other day.    ? rizatriptan (MAXALT) 10 MG tablet TAKE 1 TABLET AT ONSET OF MIGRAINE- MAY REPEAT ONCE IN 2 HOURS. LIMIT 2/24 HOURS. AVOID DAILY USE. 8 tablet 3  ? SUMAtriptan (IMITREX) 100 MG tablet TAKE 1 TABLET AT ONSET OF MIGRAINE- MAY REPEAT ONCE IN 2 HOURS. LIMIT 2/24 HOURS. AVOID DAILY USE. 9 tablet 3  ? verapamil (CALAN-SR) 120 MG CR tablet TAKE ONE TABLET AT BEDTIME. 90 tablet 3  ? Oxycodone HCl 20 MG TABS Take 0.5 tablets (10 mg total) by mouth 4 (four) times daily as needed. 60 tablet 0  ?  phentermine (ADIPEX-P) 37.5 MG tablet Take 1 tablet (37.5 mg total) by mouth daily before breakfast. 30 tablet 2  ? ?No facility-administered medications prior to visit.  ? ? ?ROS: ?Review of Systems  ?Constitutional:  Negative for activity change, appetite change, chills, fatigue and unexpected weight change.  ?HENT:  Negative for congestion, mouth sores and sinus pressure.   ?Eyes:  Negative for visual disturbance.  ?Respiratory:  Positive for shortness of breath. Negative for cough and chest tightness.   ?Gastrointestinal:  Negative for abdominal pain and nausea.  ?Genitourinary:  Negative for difficulty urinating, frequency and vaginal pain.  ?Musculoskeletal:  Positive for arthralgias and back pain. Negative for gait problem.  ?Skin:  Negative for pallor and rash.  ?Neurological:  Positive for headaches. Negative for dizziness, tremors, weakness and numbness.  ?Psychiatric/Behavioral:  Negative for confusion and sleep disturbance.   ? ?Objective:  ?BP 130/70 (BP Location: Left Arm, Patient Position: Sitting, Cuff Size: Normal)   Pulse 90   Temp 98.7 ?F (37.1 ?C) (Oral)   Ht 5' 3.25" (1.607 m)   Wt 148 lb (67.1 kg)   SpO2 97%   BMI 26.01 kg/m?  ? ?BP Readings from Last 3 Encounters:  ?03/05/22 130/70  ?02/13/22  122/76  ?01/21/22 122/82  ? ? ?Wt Readings from Last 3 Encounters:  ?03/05/22 148 lb (67.1 kg)  ?02/13/22 147 lb (66.7 kg)  ?01/21/22 142 lb (64.4 kg)  ? ? ?Physical Exam ?Constitutional:   ?   General: She is not in acute distress. ?   Appearance: She is well-developed.  ?HENT:  ?   Head: Normocephalic.  ?   Right Ear: External ear normal.  ?   Left Ear: External ear normal.  ?   Nose: Nose normal.  ?Eyes:  ?   General:     ?   Right eye: No discharge.     ?   Left eye: No discharge.  ?   Conjunctiva/sclera: Conjunctivae normal.  ?   Pupils: Pupils are equal, round, and reactive to light.  ?Neck:  ?   Thyroid: No thyromegaly.  ?   Vascular: No JVD.  ?   Trachea: No tracheal deviation.   ?Cardiovascular:  ?   Rate and Rhythm: Normal rate and regular rhythm.  ?   Heart sounds: Normal heart sounds.  ?Pulmonary:  ?   Effort: No respiratory distress.  ?   Breath sounds: No stridor. No wheezing.  ?Abdominal:  ?   General: Bowel sounds are normal. There is no distension.  ?   Palpations: Abdomen is soft. There is no mass.  ?   Tenderness: There is no abdominal tenderness. There is no guarding or rebound.  ?Musculoskeletal:  ?   Cervical back: Normal range of motion and neck supple. Tenderness present. No rigidity.  ?Lymphadenopathy:  ?   Cervical: No cervical adenopathy.  ?Skin: ?   Findings: No erythema or rash.  ?Neurological:  ?   Cranial Nerves: No cranial nerve deficit.  ?   Motor: No abnormal muscle tone.  ?   Coordination: Coordination normal.  ?   Deep Tendon Reflexes: Reflexes normal.  ?Psychiatric:     ?   Behavior: Behavior normal.     ?   Thought Content: Thought content normal.     ?   Judgment: Judgment normal.  ? ?LS w/pain ?Lab Results  ?Component Value Date  ? WBC 5.2 04/25/2021  ? HGB 13.3 04/25/2021  ? HCT 39.6 04/25/2021  ? PLT 210.0 04/25/2021  ? GLUCOSE 109 (H) 01/09/2022  ? CHOL 225 (H) 04/25/2021  ? TRIG 110.0 04/25/2021  ? HDL 70.00 04/25/2021  ? LDLDIRECT 130.2 12/21/2013  ? LDLCALC 133 (H) 04/25/2021  ? ALT 27 04/25/2021  ? AST 25 04/25/2021  ? NA 134 (L) 01/09/2022  ? K 4.1 01/09/2022  ? CL 99 01/09/2022  ? CREATININE 0.81 01/09/2022  ? BUN 14 01/09/2022  ? CO2 29 01/09/2022  ? TSH 1.81 04/25/2021  ? HGBA1C 5.5 03/31/2018  ? ? ?NM PET Image Initial (PI) Skull Base To Thigh (F-18 FDG) ? ?Result Date: 02/11/2022 ?CLINICAL DATA:  Initial treatment strategy for mediastinal mass. EXAM: NUCLEAR MEDICINE PET SKULL BASE TO THIGH TECHNIQUE: 6.8 mCi F-18 FDG was injected intravenously. Full-ring PET imaging was performed from the skull base to thigh after the radiotracer. CT data was obtained and used for attenuation correction and anatomic localization. Fasting blood glucose: 125 mg/dl  COMPARISON:  Chest CT and cardiac CT from March of 2023 FINDINGS: Mediastinal blood pool activity: SUV max 2.41 Liver activity: SUV max 2.8 NECK: No hypermetabolic lymph nodes in the neck. Incidental CT findings: none CHEST: Lobular soft tissue density in the anterior mediastinum measures 18 x 13 mm with density  of 57 Hounsfield units on the current study and baseline density of 45 Hounsfield units on the prior study. This displays no increased metabolic activity with maximum SUV of 0.7. No mediastinal lymphadenopathy or hypermetabolic lesion in the chest. Incidental CT findings: Mild calcified atheromatous plaque in the thoracic aorta. Normal heart size without pericardial effusion. Normal caliber of central pulmonary vessels. Limited assessment of cardiovascular structures given lack of intravenous contrast. Lungs are clear and there is no sign of adenopathy in the chest by size criteria. ABDOMEN/PELVIS: No abnormal hypermetabolic activity within the liver, pancreas, adrenal glands, or spleen. No hypermetabolic lymph nodes in the abdomen or pelvis. Incidental CT findings: No acute findings relative to liver, gallbladder, pancreas, spleen, adrenal glands or kidneys. Urinary bladder is decompressed without signs of adjacent stranding. Descent of bladder base towards the pelvic floor well below the pubococcygeal line on sagittal images. No acute bowel process. Aortic atherosclerosis. No sign of aneurysm. Smooth contour of the IVC. There is no gastrohepatic or hepatoduodenal ligament lymphadenopathy. No retroperitoneal or mesenteric lymphadenopathy. No pelvic sidewall lymphadenopathy. Limited assessment of retroperitoneal structures due to lack of intravenous contrast. SKELETON: No focal hypermetabolic activity to suggest skeletal metastasis. Incidental CT findings: Spinal degenerative changes without acute or destructive bone process. Two up to 2.8 cm below the pubococcygeal line on the current study. IMPRESSION:  Lobular soft tissue density anterior mediastinal mass without signs of increased metabolic activity favors a benign process. 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

## 2022-03-05 NOTE — Assessment & Plan Note (Signed)
Botox is too $$$ now ?

## 2022-03-05 NOTE — Assessment & Plan Note (Addendum)
2023 since Feb 2023 ?(-) work up by Dr Caryl Comes ?Stop Phentermine for 1-2 weeks ?Pt lost 30 lbs on Phentermine ?

## 2022-03-12 ENCOUNTER — Encounter: Payer: PPO | Admitting: Podiatry

## 2022-03-14 ENCOUNTER — Ambulatory Visit: Payer: PPO | Admitting: Neurology

## 2022-03-18 ENCOUNTER — Other Ambulatory Visit: Payer: Self-pay | Admitting: Internal Medicine

## 2022-03-18 ENCOUNTER — Encounter: Payer: Self-pay | Admitting: Internal Medicine

## 2022-03-19 ENCOUNTER — Encounter: Payer: Self-pay | Admitting: Internal Medicine

## 2022-03-19 MED ORDER — PHENTERMINE HCL 37.5 MG PO TABS: 37.5000 mg | ORAL_TABLET | Freq: Every day | ORAL | 0 refills | Status: DC

## 2022-03-19 NOTE — Telephone Encounter (Signed)
PT calls today with request on status for this RX. PT stated she would be alright with picking up the paper script again if needed.

## 2022-03-21 ENCOUNTER — Other Ambulatory Visit: Payer: Self-pay | Admitting: Internal Medicine

## 2022-03-21 ENCOUNTER — Encounter: Payer: Self-pay | Admitting: Internal Medicine

## 2022-03-21 MED ORDER — OXYCODONE HCL 20 MG PO TABS: 10.0000 mg | ORAL_TABLET | Freq: Four times a day (QID) | ORAL | 0 refills | Status: DC | PRN

## 2022-03-27 ENCOUNTER — Encounter: Payer: Self-pay | Admitting: Internal Medicine

## 2022-03-27 ENCOUNTER — Encounter: Payer: Self-pay | Admitting: *Deleted

## 2022-03-27 ENCOUNTER — Telehealth: Payer: PPO

## 2022-03-27 ENCOUNTER — Telehealth: Payer: Self-pay | Admitting: *Deleted

## 2022-03-27 ENCOUNTER — Ambulatory Visit: Payer: PPO | Admitting: Internal Medicine

## 2022-03-27 VITALS — Ht 63.25 in | Wt 138.0 lb

## 2022-03-27 DIAGNOSIS — R0609 Other forms of dyspnea: Secondary | ICD-10-CM | POA: Diagnosis not present

## 2022-03-27 DIAGNOSIS — I471 Supraventricular tachycardia: Secondary | ICD-10-CM | POA: Diagnosis not present

## 2022-03-27 NOTE — Progress Notes (Signed)
Electrophysiology TeleHealth Note   Due to national recommendations of social distancing due to COVID 19, an audio/video telehealth visit is felt to be most appropriate for this patient at this time.  See MyChart message from today for the patient's consent to telehealth for [facility].   Date:  03/27/2022   ID:  Beth Huffman, DOB May 26, 1961, MRN [id number]  Location: patient's home  Provider location: [address] [place]  Evaluation Performed: Follow-up visit  PCP:  Plotnikov, Evie Lacks, MD  Cardiologist:    Electrophysiologist:  SK   Chief Complaint:  (See Below)   History of Present Illness:    Beth Huffman is a 61 y.o. female who presents via audio/video conferencing for a telehealth visit today.  Since last being seen in our clinic for atrial tachycardia and hypertension, progressive dyspnea with eval as below  the patient reports dyspnea on exertion which is worsening  Hx of syringomyelia surgery 2005   CTA>> soft tissue density prevascular lesion 1.5 cm in short axis, possibly a pathologically enlarged lymph node mediastinal Mass  CT >>  2.0 x 1.0 cm ovoid slightly lobular prevascular space mass. No other mediastinal abnormality or hilar adenopathy. This could be a pathologic lymph node PET>> without increased metabolic activity   DATE TEST EF    7/19 Echo   60-65 %    7/19 LHC  65 % Normal CAs   3/23 CTA   CaScore = 0     Date Cr K Hgb  11/19 0.98 3.8 13.2   6/22 0.9 4.5 13.3  3/23 0.81 4.1        The patient denies symptoms of fevers, chills, cough, or new SOB worrisome for COVID 19.    Past Medical History:  Diagnosis Date   Abnormal weight gain    Acute sinusitis 12/05/2008   1/18 8/18   Arachnoid cyst 08/19/2011   Chest tightness or pressure 05/07/2018   Constipation 09/08/2012   Chronic - opioid related 11/17 [place]   Contact dermatitis 03/16/2013   5/14 relapsing - poison ivy    Dyspnea 05/07/2018   Elevated liver  enzymes 03/04/2011   Chronic off and on    Female stress incontinence 02/06/2009   Qualifier: Diagnosis of  By: Alain Marion MD, Evie Lacks    FEVER UNSPECIFIED 09/29/2007   Qualifier: Diagnosis of  By: Alain Marion MD, Evie Lacks    GERD 09/29/2007   Chronic  Protonix prn  Potential benefits of a long term PPI use as well as potential risks  and complications were explained to the patient and were aknowledged.     GERD (gastroesophageal reflux disease)    Headache 12/20/2007   Dr Hassell Done Migraines (1 per 2 wks) Topamax 400 mg/d; Imitrex prn; Ibuprofen prn, Zofran prn    Hyperglycemia 08/19/2011   Mild     Hypertension    Increased blood pressure (not hypertension) 08/23/2014   Intractable migraine 06/14/2009   Chronic Ibuprofen, Imitrex, Topamax,  Nortriptyline - intolerant   Knee pain 03/04/2011   R knee 2018 L knee pain - L knee b anserina and L knee is tender w/ROM: L knee pain is worse - patellofemoral syndrome vs other   LBP (low back pain)    Local anesthetic drug adverse reaction 01/21/2022   LOW BACK PAIN 09/29/2007   Chronic, lately (2016) disabling Due to syringomyelia On Oxycodone  Potential benefits of a long term opioids use as well as potential risks (i.e. addiction risk,  apnea etc) and complications (i.e. Somnolence, constipation and others) were explained to the patient and were aknowledged.     LUQ PAIN 05/23/2010   Qualifier: Diagnosis of  By: Plotnikov MD, Evie Lacks    Migraine, chronic, without aura, intractable 10/28/2017   Botox approved diagnosis. 7/19 Worse. Start Emagility inj. Relpax   Migraines    Nausea & vomiting 08/19/2011   Nausea without vomiting 06/14/2009   Due to migraines     NECK PAIN 06/14/2009    Chronic, lately (2016) disabling Due to syringomyelia On Oxycodone  Potential benefits of a long term opioids use as well as potential risks (i.e. addiction risk, apnea etc) and complications (i.e. Somnolence, constipation and others) were explained to the patient and were  aknowledged.     NONSPECIFIC ABNORMAL RESULTS LIVR FUNCTION STUDY 09/28/2009   Qualifier: Diagnosis of  By: Alain Marion MD, Evie Lacks    OBSESSIVE-COMPULSIVE DISORDER 05/23/2010   Chronic - compulsive shopping    Onychomycosis 01/30/2016   4/17    Orthostatic hypotension 06/30/2013   9/14 likely Rapaflo induced    OTITIS EXTERNA 05/10/2008   Qualifier: Diagnosis of  By: Alain Marion MD, Evie Lacks    Palpitations 06/21/2014   PARESTHESIA 07/13/2008   Qualifier: Diagnosis of  By: Alain Marion MD, Evie Lacks    Partial thickness burn of left wrist 09/11/2015   Postoperative state 07/24/2016   Pyelonephritis 2008   Rash 07/27/2017   Stress 2009   Syncope, near 06/30/2013   9/14 likely Rapaflo induced 12/14 relapsing off Rapaflo x 7 2/15 no relapsing since on 1/2 dose of Topamax    Syringobulbia ([place])    SYRINGOMYELIA 08/20/2007   Chronic Dr Krista Blue Dr Jalene Mullet at [facility] Chronic pain    Syringomyelia ([place])    Tachycardia    Tonsillitis, chronic 02/26/2017   2018 worse   URINARY TRACT INFECTION (UTI) 03/23/2008   Qualifier: Diagnosis of  By: Alain Marion MD, Evie Lacks    UTI (lower urinary tract infection)    Well adult exam 12/21/2013   We discussed age appropriate health related issues, including available/recomended screening tests and vaccinations. We discussed a need for adhering to healthy diet and exercise. Labs/EKG were reviewed/ordered. All questions were answered.       Past Surgical History:  Procedure Laterality Date   ABDOMINAL HYSTERECTOMY     ANTERIOR AND POSTERIOR REPAIR N/A 07/24/2016   Procedure: Possible ANTERIOR (CYSTOCELE) repair, perineoplasty;  Surgeon: Princess Bruins, MD;  Location: [facility];  Service: Gynecology;  Laterality: N/A;   BLADDER SUSPENSION N/A 07/24/2016   Procedure: TRANSVAGINAL TAPE (TVT) PROCEDURE;  Surgeon: Princess Bruins, MD;  Location: [facility];  Service: Gynecology;  Laterality: N/A;   COLONOSCOPY     CYSTOSCOPY N/A 07/24/2016   Procedure: CYSTOSCOPY;  Surgeon:  Princess Bruins, MD;  Location: [facility];  Service: Gynecology;  Laterality: N/A;   intracranial decompression surgery  2006   LEFT HEART CATH AND CORONARY ANGIOGRAPHY N/A 05/07/2018   Procedure: LEFT HEART CATH AND CORONARY ANGIOGRAPHY;  Surgeon: Belva Crome, MD;  Location: [facility];  Service: Cardiovascular;  Laterality: N/A;   PARTIAL KNEE ARTHROPLASTY Left 09/07/2018   Procedure: UNICOMPARTMENTAL LEFT KNEE;  Surgeon: Renette Butters, MD;  Location: [facility];  Service: Orthopedics;  Laterality: Left;  Adductor Block   ROBOTIC ASSISTED LAPAROSCOPIC SACROCOLPOPEXY N/A 07/24/2016   Procedure: ROBOTIC ASSISTED LAPAROSCOPIC SACROCOLPOPEXY WITH PERINEOPLASTY;  Surgeon: Princess Bruins, MD;  Location: [facility];  Service: Gynecology;  Laterality: N/A;   TUBAL LIGATION  Current Outpatient Medications  Medication Sig Dispense Refill   cholecalciferol (VITAMIN D) 1000 UNITS tablet Take 1,000 Units by mouth daily.     cyclobenzaprine (FLEXERIL) 5 MG tablet TAKE 1 TABLET THREE TIMES DAILY AS NEEDED FOR MUSCLE SPASMS. 90 tablet 3   diphenhydrAMINE (BENADRYL) 50 MG tablet Take 50 mg of benadryl 1 hour before contrast injection 1 tablet 1   estradiol (ESTRACE) 0.5 MG tablet Take 1 tablet (0.5 mg total) by mouth daily. 90 tablet 4   famotidine (PEPCID) 40 MG tablet Take 1 tablet (40 mg total) by mouth daily. 90 tablet 3   ibuprofen (IBU) 600 MG tablet TAKE 1 TABLET EVERY 8 HOURS AS NEEDED FOR HEADACHE. 90 tablet 1   Multiple Vitamin (MULTIVITAMIN PO) Take 1 tablet by mouth daily.     ondansetron (ZOFRAN) 8 MG tablet TAKE ONE TABLET BY MOUTH EVERY 8 HOURS AS NEEDED FOR NAUSEA OR FOR VOMITING 21 tablet 3   Oxycodone HCl 20 MG TABS Take 0.5 tablets (10 mg total) by mouth 4 (four) times daily as needed. Please fill on or after 03/23/22 -please fill early due to travel plans. 60 tablet 0   pantoprazole (PROTONIX) 40 MG tablet Take 1 tablet (40 mg total) by mouth at bedtime. 90 tablet 3    polyethylene glycol (MIRALAX / GLYCOLAX) packet Take 8.5 g by mouth every other day.     rizatriptan (MAXALT) 10 MG tablet TAKE 1 TABLET AT ONSET OF MIGRAINE- MAY REPEAT ONCE IN 2 HOURS. LIMIT 2/24 HOURS. AVOID DAILY USE. 8 tablet 3   SUMAtriptan (IMITREX) 100 MG tablet TAKE 1 TABLET AT ONSET OF MIGRAINE- MAY REPEAT ONCE IN 2 HOURS. LIMIT 2/24 HOURS. AVOID DAILY USE. 9 tablet 3   UBRELVY 100 MG TABS Take 100 mg by mouth as needed.     verapamil (CALAN-SR) 120 MG CR tablet TAKE ONE TABLET AT BEDTIME. 90 tablet 3   BOTOX 200 units SOLR Inject 155 units IM into multiple site in the face,neck and head once every 90 days (Patient not taking: Reported on 03/27/2022) 1 each 4   No current facility-administered medications for this visit.    Allergies:   Atenolol, Cefuroxime axetil, Codeine sulfate, Fentanyl, Imdur [isosorbide nitrate], Nitrofurantoin, Pregabalin, Rapaflo [silodosin], Topamax [topiramate], Tramadol, Tramadol hcl, and Iodinated contrast media   Social History:  The patient  reports that she has never smoked. She has never used smokeless tobacco. She reports that she does not drink alcohol and does not use drugs.   Family History:  The patient's   family history includes Anxiety disorder in an other family member; Breast cancer in her maternal aunt; COPD in her mother; Colon cancer (age of onset: 63) in her maternal uncle; Heart attack in her maternal grandmother; Heart disease in her mother; Hypertension in her father and mother; Peripheral vascular disease in her mother; Stroke in her maternal grandfather and paternal grandfather.   ROS:  Please see the history of present illness.   All other systems are personally reviewed and negative.    Exam:    Vital Signs:  Ht 5' 3.25" (1.607 m)   Wt 138 lb (62.6 kg)   BMI 24.25 kg/m        Labs/Other Tests and Data Reviewed:    Recent Labs: 04/25/2021: ALT 27; Hemoglobin 13.3; Platelets 210.0; TSH 1.81 01/09/2022: BUN 14; Creatinine, Ser  0.81; Potassium 4.1; Sodium 134   Wt Readings from Last 3 Encounters:  03/27/22 138 lb (62.6 kg)  03/05/22 148  lb (67.1 kg)  02/13/22 147 lb (66.7 kg)     Other studies personally reviewed: Additional studies/ records that were reviewed today include: (As above)  Review of the above records today demonstrates: (As above)  Prior radiographs:      ASSESSMENT & PLAN:     Exertional chest discomfort and shortness of breath   Hypertension   Atrial tachycardia   HA migraines   Syringomyelia/Arachnoid Cyst  Facial flushing without itching--and no diarrhea   Swallowing difficulty  ? Globus   coughing   Hoarseness    Worsening dyspnea with bendopnea--chest pain  Need further evaluation and will include echo, stress myoview, and BNP   With hoarseness issue, will refer her to voice, and ENT.  Need to get the name.  The swallowing issue is not clear to me, to 1 uneducated like me sounds like globus.  The coughing is concerning however and this may need further evaluation or perhaps ENT as a place to start also.  Encouraging that the lung mass was without increased metabolic activity  I would not undertake elective surgery at this time   COVID 19 screen The patient denies symptoms of COVID 19 at this time.  The importance of social distancing was discussed today.  Follow-up:       Current medicines are reviewed at length with the patient today.   The patient  concerns regarding her medicines.  The following changes were made today:    Labs/ tests ordered today include: (As above)  No orders of the defined types were placed in this encounter.     months  Patient Risk:  after full review of this patients clinical status, I feel that they are at moderate  risk at this time.  Today, I have spent 24 minutes with the patient with telehealth technology discussing the above.  Signed, Virl Axe, MD  03/27/2022 8:35 AM     [place] [place] [address] [phone] (office) [phone] (fax)

## 2022-03-27 NOTE — Telephone Encounter (Signed)
Per Almyra Deforest, PAC, pre op provider today instructed me to please cancel the tele appt today. Pt just saw Dr. Caryl Comes today as well and she did not need the pre op also. Per pre op provider to please refund the pt her co-pay for the telephone pre op appt today. I assured PAC I will send a message to the billing dept to refund the co-pay from the pre op call today with Almyra Deforest, PAC. The appt with Dr. Caryl Comes is correct and that co-pay stands. Pre op provider asked for me to remove pt from the pre op schedule today. This has been done as well. Almyra Deforest, [facility] states he s/w the pt and apologized for the call from the pre op team as well and that we will refund her co-pay for the pre op appt. Pre op provider thanked me for my help.   I will send this phone note to our billing dept to reach out to the pt.

## 2022-03-27 NOTE — Addendum Note (Signed)
Addended by: Alvis Lemmings C on: 03/27/2022 04:06 PM   Modules accepted: Orders

## 2022-03-27 NOTE — Patient Instructions (Addendum)
Medication Instructions:  - Your physician recommends that you continue on your current medications as directed. Please refer to the Current Medication list given to you today.  *If you need a refill on your cardiac medications before your next appointment, please call your pharmacy*   Lab Work: - Your physician recommends that you return for lab work when you are back in town: please call [phone] to schedule a day to have this done  BNP  If you have labs (blood work) drawn today and your tests are completely normal, you will receive your results only by: MyChart Message (if you have MyChart) OR A paper copy in the mail If you have any lab test that is abnormal or we need to change your treatment, we will call you to review the results.   Testing/Procedures:  1) Echocardiogram: - Your physician has requested that you have an echocardiogram. Echocardiography is a painless test that uses sound waves to create images of your heart. It provides your doctor with information about the size and shape of your heart and how well your heart's chambers and valves are working. This procedure takes approximately one hour. There are no restrictions for this procedure.There is a possibility that an IV may need to be started during your test to inject an image enhancing agent. This is done to obtain more optimal pictures of your heart. Therefore we ask that you do at least drink some water prior to coming in to hydrate your veins.    2) Exercise Stress Myoview: -Your physician has requested that you have an exercise stress myoview.   Date:_______________  Time:_______________   Please arrive 15 minutes prior to your appointment time for registration and insurance purposes.  [address] [place], [place] [address]  The test will take approximately 3 to 4 hours to complete; you may bring reading material.  If someone comes with you to your appointment, they will need to  remain in the main lobby due to limited space in the testing area. **If you are pregnant or breastfeeding, please notify the nuclear lab prior to your appointment**  How to prepare for your Myocardial Perfusion Test: Do not eat or drink 3 hours prior to your test, except you may have water. Do not consume products containing caffeine (regular or decaffeinated) 12 hours prior to your test. (ex: coffee, chocolate, sodas, tea). Do bring a list of your current medications with you.  If not listed below, you may take your medications as normal. Do wear comfortable clothes (no dresses or overalls) and walking shoes, tennis shoes preferred (No heels or open toe shoes are allowed). Do NOT wear cologne, perfume, aftershave, or lotions (deodorant is allowed). If these instructions are not followed, your test will have to be rescheduled.  Please report to [address] for your test.  If you have questions or concerns about your appointment, you can call the Nuclear Lab at [phone].  If you cannot keep your appointment, please provide 24 hours notification to the Nuclear Lab, to avoid a possible $50 charge to your account.   3) ENT contact:  - Dr. Mila Homer at [facility] - Phone: [phone]    Follow-Up: At [facility], you and your health needs are our priority.  As part of our continuing mission to provide you with exceptional heart care, we have created designated Provider Care Teams.  These Care Teams  include your primary Cardiologist (physician) and Advanced Practice Providers (APPs -  Physician Assistants and Nurse Practitioners) who all work together to provide you with the care you need, when you need it.  We recommend signing up for the patient portal called "MyChart".  Sign up information is provided on this After Visit Summary.  MyChart is used to connect with patients for Virtual Visits (Telemedicine).  Patients are able to view  lab/test results, encounter notes, upcoming appointments, etc.  Non-urgent messages can be sent to your provider as well.   To learn more about what you can do with MyChart, go to [email or web address].     Your next appointment:   February 2024   The format for your next appointment:   In Person  Provider:   Virl Axe, MD   Other Instructions  Echocardiogram An echocardiogram is a test that uses sound waves (ultrasound) to produce images of the heart. Images from an echocardiogram can provide important information about: Heart size and shape. The size and thickness and movement of your heart's walls. Heart muscle function and strength. Heart valve function or if you have stenosis. Stenosis is when the heart valves are too narrow. If blood is flowing backward through the heart valves (regurgitation). A tumor or infectious growth around the heart valves. Areas of heart muscle that are not working well because of poor blood flow or injury from a heart attack. Aneurysm detection. An aneurysm is a weak or damaged part of an artery wall. The wall bulges out from the normal force of blood pumping through the body. Tell a health care provider about: Any allergies you have. All medicines you are taking, including vitamins, herbs, eye drops, creams, and over-the-counter medicines. Any blood disorders you have. Any surgeries you have had. Any medical conditions you have. Whether you are pregnant or may be pregnant. What are the risks? Generally, this is a safe test. However, problems may occur, including an allergic reaction to dye (contrast) that may be used during the test. What happens before the test? No specific preparation is needed. You may eat and drink normally. What happens during the test?  You will take off your clothes from the waist up and put on a hospital gown. Electrodes or electrocardiogram (ECG)patches may be placed on your chest. The electrodes or patches are  then connected to a device that monitors your heart rate and rhythm. You will lie down on a table for an ultrasound exam. A gel will be applied to your chest to help sound waves pass through your skin. A handheld device, called a transducer, will be pressed against your chest and moved over your heart. The transducer produces sound waves that travel to your heart and bounce back (or "echo" back) to the transducer. These sound waves will be captured in real-time and changed into images of your heart that can be viewed on a video monitor. The images will be recorded on a computer and reviewed by your health care provider. You may be asked to change positions or hold your breath for a short time. This makes it easier to get different views or better views of your heart. In some cases, you may receive contrast through an IV in one of your veins. This can improve the quality of the pictures from your heart. The procedure may vary among health care providers and hospitals. What can I expect after the test? You may return to your normal, everyday life, including diet, activities, and medicines, unless your  health care provider tells you not to do that. Follow these instructions at home: It is up to you to get the results of your test. Ask your health care provider, or the department that is doing the test, when your results will be ready. Keep all follow-up visits. This is important. Summary An echocardiogram is a test that uses sound waves (ultrasound) to produce images of the heart. Images from an echocardiogram can provide important information about the size and shape of your heart, heart muscle function, heart valve function, and other possible heart problems. You do not need to do anything to prepare before this test. You may eat and drink normally. After the echocardiogram is completed, you may return to your normal, everyday life, unless your health care provider tells you not to do that. This  information is not intended to replace advice given to you by your health care provider. Make sure you discuss any questions you have with your health care provider. Document Revised: 06/26/2021 Document Reviewed: 06/05/2020 Elsevier Patient Education  [place].   Cardiac Nuclear Scan A cardiac nuclear scan is a test that is done to check the flow of blood to your heart. It is done when you are resting and when you are exercising. The test looks for problems such as: Not enough blood reaching a portion of the heart. The heart muscle not working as it should. You may need this test if: You have heart disease. You have had lab results that are not normal. You have had heart surgery or a balloon procedure to open up blocked arteries (angioplasty). You have chest pain. You have shortness of breath. In this test, a special dye (tracer) is put into your bloodstream. The tracer will travel to your heart. A camera will then take pictures of your heart to see how the tracer moves through your heart. This test is usually done at a hospital and takes 2-4 hours. Tell a doctor about: Any allergies you have. All medicines you are taking, including vitamins, herbs, eye drops, creams, and over-the-counter medicines. Any problems you or family members have had with anesthetic medicines. Any blood disorders you have. Any surgeries you have had. Any medical conditions you have. Whether you are pregnant or may be pregnant. What are the risks? Generally, this is a safe test. However, problems may occur, such as: Serious chest pain and heart attack. This is only a risk if the stress portion of the test is done. Rapid heartbeat. A feeling of warmth in your chest. This feeling usually does not last long. Allergic reaction to the tracer. What happens before the test? Ask your doctor about changing or stopping your normal medicines. This is important. Follow instructions from your doctor about what  you cannot eat or drink. Remove your jewelry on the day of the test. What happens during the test? An IV tube will be inserted into one of your veins. Your doctor will give you a small amount of tracer through the IV tube. You will wait for 20-40 minutes while the tracer moves through your bloodstream. Your heart will be monitored with an electrocardiogram (ECG). You will lie down on an exam table. Pictures of your heart will be taken for about 15-20 minutes. You may also have a stress test. For this test, one of these things may be done: You will be asked to exercise on a treadmill or a stationary bike. You will be given medicines that will make your heart work harder. This  is done if you are unable to exercise. When blood flow to your heart has peaked, a tracer will again be given through the IV tube. After 20-40 minutes, you will get back on the exam table. More pictures will be taken of your heart. Depending on the tracer that is used, more pictures may need to be taken 3-4 hours later. Your IV tube will be removed when the test is over. The test may vary among doctors and hospitals. What happens after the test? Ask your doctor: Whether you can return to your normal schedule, including diet, activities, and medicines. Whether you should drink more fluids. This will help to remove the tracer from your body. Drink enough fluid to keep your pee (urine) pale yellow. Ask your doctor, or the department that is doing the test: When will my results be ready? How will I get my results? Summary A cardiac nuclear scan is a test that is done to check the flow of blood to your heart. Tell your doctor whether you are pregnant or may be pregnant. Before the test, ask your doctor about changing or stopping your normal medicines. This is important. Ask your doctor whether you can return to your normal activities. You may be asked to drink more fluids. This information is not intended to replace  advice given to you by your health care provider. Make sure you discuss any questions you have with your health care provider. Document Revised: 06/26/2021 Document Reviewed: 03/27/2021 Elsevier Patient Education  [place]

## 2022-03-28 ENCOUNTER — Telehealth (HOSPITAL_COMMUNITY): Payer: Self-pay | Admitting: *Deleted

## 2022-03-28 NOTE — Telephone Encounter (Signed)
Left message on voicemail per DPR in reference to upcoming appointment scheduled on 03/31/2022 at 10:30 with detailed instructions given per Myocardial Perfusion Study Information Sheet for the test. LM to arrive 15 minutes early, and that it is imperative to arrive on time for appointment to keep from having the test rescheduled. If you need to cancel or reschedule your appointment, please call the office within 24 hours of your appointment. Failure to do so may result in a cancellation of your appointment, and a $50 no show fee. Phone number given for call back for any questions.

## 2022-03-31 ENCOUNTER — Other Ambulatory Visit: Payer: PPO

## 2022-03-31 ENCOUNTER — Ambulatory Visit (HOSPITAL_COMMUNITY): Payer: PPO | Attending: Cardiology

## 2022-03-31 ENCOUNTER — Other Ambulatory Visit: Payer: Self-pay | Admitting: *Deleted

## 2022-03-31 DIAGNOSIS — R0609 Other forms of dyspnea: Secondary | ICD-10-CM | POA: Insufficient documentation

## 2022-03-31 LAB — MYOCARDIAL PERFUSION IMAGING
Angina Index: 0
Duke Treadmill Score: 2
Estimated workload: 7
Exercise duration (min): 7 min
LV dias vol: 73 mL (ref 46–106)
LV sys vol: 31 mL
MPHR: 158 {beats}/min
Nuc Stress EF: 57 %
Peak HR: 134 {beats}/min
Percent HR: 85 %
RPE: 19
Rest HR: 83 {beats}/min
Rest Nuclear Isotope Dose: 10.2 mCi
SDS: 0
SRS: 0
SSS: 0
ST Elevation (mm): 1 mm
Stress Nuclear Isotope Dose: 29.3 mCi
TID: 1.04

## 2022-03-31 MED ORDER — TECHNETIUM TC 99M TETROFOSMIN IV KIT
10.2000 | PACK | Freq: Once | INTRAVENOUS | Status: AC | PRN
  Administered 2022-03-31: 10.2 via INTRAVENOUS

## 2022-03-31 MED ORDER — TECHNETIUM TC 99M TETROFOSMIN IV KIT
29.3000 | PACK | Freq: Once | INTRAVENOUS | Status: AC | PRN
  Administered 2022-03-31: 29.3 via INTRAVENOUS

## 2022-03-31 NOTE — Progress Notes (Unsigned)
NEUROLOGY FOLLOW UP OFFICE NOTE  Beth Huffman [id number]  Assessment/Plan:   Migraine without aura, without status migrainosus, not intractable.   1.  Migraine prevention:  Due to cost, will discontinue Botox and try Aimovig '140mg'$  2.  Migraine rescue:  Roselyn Meier.  She has sumatriptan or rizatriptan for second line 3.  Limit use of pain relievers to no more than 2 days out of week to prevent risk of rebound or medication-overuse headache. 4.  Keep headache diary 5.  Follow up 4 months.   Subjective:  Beth Huffman is a 61 year old right-handed Caucasian woman who follows up for migraines.   UPDATE: She changed insurance and now copay for Botox is $400.  Does not qualify for assistance. Nurtec caused her headaches.  Roselyn Meier effective Intensity:  severe Duration:  1 hour with Ubrelvy Frequency: 15 to 20 days a month for the second half of 2nd month and all of 3rd month before next round of Botox Current NSAIDS:  ibuprofen, naproxen (take with triptan) Current analgesics:  oxycodone Current triptans:  Maxalt '10mg'$  OR sumatriptan '100mg'$  (makes her chest feel tight) Current ergotamine:  none Current anti-emetic:  Zofran '8mg'$  Current muscle relaxants:  Flexeril '5mg'$  Current anti-anxiolytic:  none Current sleep aide:  none Current Antihypertensive medications:  Verapamil CR '128mg'$  Current Antidepressant medications:  none Current Anticonvulsant medications:  none Current anti-CGRP:  none Current Vitamins/Herbal/Supplements:  none Current Antihistamines/Decongestants:  Flonase Other therapy:  Botox status Hormone/birth control:  estradiol   Caffeine:  1 1/2 cups coffee daily Diet:  Keeps hydrated.  Does not skip meals.  On Optivia diet.  Lost 30 lbs since January Exercise:  Walks dogs Depression:  no; Anxiety:  no Other pain:  Spinal pain related to perineural cysts.  Recent knee replacement, still with mild soreness Sleep hygiene:  good   HISTORY:  Onset:  Since the early  1990s.  She was evaluated at [facility].  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    MRI of spine from 2005 showed a syrinx from T3 to T12 levels.  She subsequently underwent occipital posterior fossa decompression surgery that year.  A CT cystogram in 2007 showed residual cyst in the posterior fossa with postoperative changes but no hydrocephalus or other intracranial mass.  Headaches subsided following decompression.  They started to return in May 2016.  She was previously followed by Dr. Krista Blue at [facility]. Location:  Bifrontal/posterior neck pain Quality:  Pressure, throbbing Initial intensity:  10/10.  she denies new headache, thunderclap headache or severe headache that wakes her from sleep. Aura:  no Premonitory Phase:  no Postdrome:  no Associated symptoms:  With or without nausea, photophobia, phonophobia.  She denies associated unilateral numbness or weakness. Initial Duration:  1-2 days.  Often in the morning when she wakes up Initial Frequency:  15 days a month Initial Frequency of abortive medication: sumatriptan or rizatriptan 12-15 days a month Triggers:  Prolonged standing or activity/lifting Relieving factors:  Just medications. Activity:  Can't function   Past NSAIDS:  Cambia, naproxen Past analgesics:  Excedrin, Tylenol Past abortive triptans:  Relpax '40mg'$ , Zomig '5mg'$  NS, sumatriptan [place] Past abortive ergotamine:  none Past muscle relaxants:  none Past anti-emetic:  none Past antihypertensive medications:  atenolol Past antidepressant medications:  Nortriptyline (did not feel well on it) Past anticonvulsant medications:  Trokendi XR,topiramate  $'200mg'g$  twice daily;  gabapentin, Lyrica Past anti-CGRP: Emgality (palpitations) Past vitamins/Herbal/Supplements:  none Past antihistamines/decongestants:   none Other past therapies:  Botox (effective)   Family history of headache:  No  PAST MEDICAL HISTORY: Past Medical History:  Diagnosis Date   Abnormal weight gain    Acute sinusitis 12/05/2008   1/18 8/18   Arachnoid cyst 08/19/2011   Chest tightness or pressure 05/07/2018   Constipation 09/08/2012   Chronic - opioid related 11/17 Amitiza   Contact dermatitis 03/16/2013   5/14 relapsing - [place] iv[place]    Dyspnea 05/07/2018   Elevated liver enzymes 03/04/2011   Chronic off and on    Female stress incontinence 02/06/2009   Qualifier: Diagnosis of  By: Plotnikov MD, Evie Lacks    FEVER UNSPECIFIED 09/29/2007   Qualifier: Diagnosis of  By: Alain Marion MD, Evie Lacks    GERD 09/29/2007   Chronic  Protonix prn  Potential benefits of a long term PPI use as well as potential risks  and complications were explained to the patient and were aknowledged.     GERD (gastroesophageal reflux disease)    Headache 12/20/2007   Dr Hassell Done Migraines (1 per 2 wks) Topamax 400 mg/d; Imitrex prn; Ibuprofen prn, Zofran prn    Hyperglycemia 08/19/2011   Mild     Hypertension    Increased blood pressure (not hypertension) 08/23/2014   Intractable migraine 06/14/2009   Chronic Ibuprofen, Imitrex, Topamax,  Nortriptyline - intolerant   Knee pain 03/04/2011   R knee 2018 L knee pain - L knee b anserina and L knee is tender w/ROM: L knee pain is worse - patellofemoral syndrome vs other   LBP (low back pain)    Local anesthetic drug adverse reaction 01/21/2022   LOW BACK PAIN 09/29/2007   Chronic, lately (2016) disabling Due to syringomyelia On Oxycodone  Potential benefits of a long term opioids use as well as potential risks (i.e. addiction risk, apnea etc) and complications (i.e. Somnolence, constipation and others) were explained to the patient and were aknowledged.     LUQ PAIN 05/23/2010   Qualifier: Diagnosis of  By: Plotnikov MD, Evie Lacks    Migraine, chronic, without aura, intractable 10/28/2017   Botox approved  diagnosis. 7/19 Worse. Start Emagility inj. Relpax   Migraines    Nausea & vomiting 08/19/2011   Nausea without vomiting 06/14/2009   Due to migraines     NECK PAIN 06/14/2009    Chronic, lately (2016) disabling Due to syringomyelia On Oxycodone  Potential benefits of a long term opioids use as well as potential risks (i.e. addiction risk, apnea etc) and complications (i.e. Somnolence, constipation and others) were explained to the patient and were aknowledged.     NONSPECIFIC ABNORMAL RESULTS LIVR FUNCTION STUDY 09/28/2009   Qualifier: Diagnosis of  By: Alain Marion MD, Evie Lacks    OBSESSIVE-COMPULSIVE DISORDER 05/23/2010   Chronic - compulsive shopping    Onychomycosis 01/30/2016   4/17    Orthostatic hypotension 06/30/2013   9/14 likely Rapaflo induced    OTITIS EXTERNA 05/10/2008   Qualifier: Diagnosis of  By: Alain Marion MD, Evie Lacks    Palpitations 06/21/2014   PARESTHESIA 07/13/2008   Qualifier: Diagnosis of  By: Alain Marion MD, Evie Lacks    Partial thickness burn of left wrist 09/11/2015   Postoperative state 07/24/2016   Pyelonephritis 2008   Rash 07/27/2017   Stress 2009   Syncope, near 06/30/2013   9/14 likely Rapaflo induced 12/14 relapsing off Rapaflo x 7 2/15 no  relapsing since on 1/2 dose of Topamax    Syringobulbia ([place])    SYRINGOMYELIA 08/20/2007   Chronic Dr Krista Blue Dr [facility]ene [facility]let [facility] Chronic pain    Syringomyelia [facility])    Tachycardia    Tonsillitis, chronic 02/26/2017   2018 worse   URINARY TRACT INFECTION (UTI) 03/23/2008   Qualifier: Diagnosis of  By: Alain Marion MD, Evie Lacks    UTI (lower urinary tract infection)    Well adult exam 12/21/2013   We discussed age appropriate health related issues, including available/recomended screening tests and vaccinations. We discussed a need for adhering to healthy diet and exercise. Labs/EKG were reviewed/ordered. All questions were answered.       MEDICATIONS: Current Outpatient Medications on File Prior to Visit  Medication Sig Dispense  Refill   BOTOX 200 units SOLR Inject 155 units IM into multiple site in the face,neck and head once every 90 days (Patient not taking: Reported on 03/27/2022) 1 each 4   cholecalciferol (VITAMIN D) 1000 UNITS tablet Take 1,000 Units by mouth daily.     cyclobenzaprine (FLEXERIL) 5 MG tablet TAKE 1 TABLET THREE TIMES DAILY AS NEEDED FOR MUSCLE SPASMS. 90 tablet 3   diphenhydrAMINE (BENADRYL) 50 MG tablet Take 50 mg of benadryl 1 hour before contrast injection 1 tablet 1   estradiol (ESTRACE) 0.5 MG tablet Take 1 tablet (0.5 mg total) by mouth daily. 90 tablet 4   famotidine (PEPCID) 40 MG tablet Take 1 tablet (40 mg total) by mouth daily. 90 tablet 3   ibuprofen (IBU) 600 MG tablet TAKE 1 TABLET EVERY 8 HOURS AS NEEDED FOR HEADACHE. 90 tablet 1   Multiple Vitamin (MULTIVITAMIN PO) Take 1 tablet by mouth daily.     ondansetron (ZOFRAN) 8 MG tablet TAKE ONE TABLET BY MOUTH EVERY 8 HOURS AS NEEDED FOR NAUSEA OR FOR VOMITING 21 tablet 3   Oxycodone HCl 20 MG TABS Take 0.5 tablets (10 mg total) by mouth 4 (four) times daily as needed. Please fill on or after 03/23/22 -please fill early due to travel plans. 60 tablet 0   pantoprazole (PROTONIX) 40 MG tablet Take 1 tablet (40 mg total) by mouth at bedtime. 90 tablet 3   polyethylene glycol (MIRALAX / GLYCOLAX) packet Take 8.5 g by mouth every other day.     rizatriptan (MAXALT) 10 MG tablet TAKE 1 TABLET AT ONSET OF MIGRAINE- MAY REPEAT ONCE IN 2 HOURS. LIMIT 2/24 HOURS. AVOID DAILY USE. 8 tablet 3   SUMAtriptan (IMITREX) 100 MG tablet TAKE 1 TABLET AT ONSET OF MIGRAINE- MAY REPEAT ONCE IN 2 HOURS. LIMIT 2/24 HOURS. AVOID DAILY USE. 9 tablet 3   UBRELVY 100 MG TABS Take 100 mg by mouth as needed.     verapamil (CALAN-SR) 120 MG CR tablet TAKE ONE TABLET AT BEDTIME. 90 tablet 3   No current facility-administered medications on file prior to visit.    ALLERGIES: Allergies  Allergen Reactions   Atenolol Other (See Comments)    Wt gain   Cefuroxime Axetil  Nausea Only and Other (See Comments)    REACTION: nausea - but tolerates PCN   Codeine Sulfate Nausea And Vomiting and Nausea Only   Fentanyl Nausea And Vomiting and Other (See Comments)    REACTION: bad reaction   Imdur [Isosorbide Nitrate] Other (See Comments)    Headaches   Nitrofurantoin Other (See Comments)    Unknown   Pregabalin Other (See Comments)    REACTION: cp   Rapaflo [Silodosin] Other (See Comments)  Orthostatic BP drop, near syncope    Topamax [Topiramate]     Tight chest    Tramadol Other (See Comments)   Tramadol Hcl Nausea And Vomiting    REACTION: sick   Iodinated Contrast Media Rash    FAMILY HISTORY: Family History  Problem Relation Age of Onset   COPD Mother    Peripheral vascular disease Mother    Hypertension Mother    Heart disease Mother    Hypertension Father    Breast cancer Maternal Aunt    Colon cancer Maternal Uncle 58   Heart attack Maternal Grandmother    Stroke Maternal Grandfather    Stroke Paternal Grandfather    Anxiety disorder Other       Objective:  Blood pressure (!) 143/78, pulse 90, height '5\' 4"'$  (1.626 m), weight 166 lb 6.4 oz (75.5 kg), SpO2 94 %. General: No acute distress.  Patient appears well-groomed.   Head:  Normocephalic/atraumatic Eyes:  Fundi examined but not visualized Neck: supple, no paraspinal tenderness, full range of motion Heart:  Regular rate and rhythm Lungs:  Clear to auscultation bilaterally Back: No paraspinal tenderness Neurological Exam: alert and oriented to person, place, and time.  Speech fluent and not dysarthric, language intact.  CN II-XII intact. Bulk and tone normal, muscle strength 5/5 throughout.  Sensation to light touch intact.  Deep tendon reflexes 2+ throughout, toes downgoing.  Finger to nose testing intact.  Gait normal, Romberg negative.   Metta Clines, DO  CC: Lew Dawes, MD

## 2022-04-01 ENCOUNTER — Other Ambulatory Visit: Payer: PPO

## 2022-04-01 ENCOUNTER — Encounter: Payer: Self-pay | Admitting: Internal Medicine

## 2022-04-01 ENCOUNTER — Encounter: Payer: Self-pay | Admitting: Neurology

## 2022-04-01 ENCOUNTER — Ambulatory Visit: Payer: PPO | Admitting: Neurology

## 2022-04-01 ENCOUNTER — Telehealth (HOSPITAL_COMMUNITY): Payer: Self-pay | Admitting: Pharmacy Technician

## 2022-04-01 VITALS — BP 143/78 | HR 90 | Ht 64.0 in | Wt 166.4 lb

## 2022-04-01 DIAGNOSIS — G43009 Migraine without aura, not intractable, without status migrainosus: Secondary | ICD-10-CM | POA: Diagnosis not present

## 2022-04-01 DIAGNOSIS — R0609 Other forms of dyspnea: Secondary | ICD-10-CM

## 2022-04-01 LAB — PRO B NATRIURETIC PEPTIDE: NT-Pro BNP: 150 pg/mL (ref 0–287)

## 2022-04-01 MED ORDER — AIMOVIG 140 MG/ML ~~LOC~~ SOAJ: 140.0000 mg | SUBCUTANEOUS | 5 refills | Status: DC

## 2022-04-01 NOTE — Patient Instructions (Signed)
Will start Aimovig '140mg'$  every 28 days Ubrelvy as needed.  Try to limit sumatriptan or rizatriptan to no more than 2 days out of week Follow up 4 months.

## 2022-04-01 NOTE — Telephone Encounter (Signed)
Patient Advocate Encounter   Received notification that prior authorization for Aimovig '140MG'$ /ML auto-injectors is required.   PA submitted on 04/01/2022 Key [id number] Status is pending       Lyndel Safe, [place] Patient Advocate Specialist [place] Patient Advocate Team Direct Number: [phone]  Fax: [phone]

## 2022-04-01 NOTE — Telephone Encounter (Signed)
Patient Advocate Encounter  Prior Authorization for Aimovig '140MG'$ /ML auto-injectors has been approved.    PA# [id number] Effective dates: 04/01/2022 through 10/01/2022     Lyndel Safe, [place] Patient Advocate Specialist [place] Patient Advocate Team Direct Number: [phone]  Fax: [phone]

## 2022-04-02 NOTE — Telephone Encounter (Signed)
Thnks  I dont feel strongly at the echo at this point with negative stress and normal BNP It the dyspnea continues perhaps she would benefit from pulm eval I did not understand the request for thoracic specialist /Thanks SK

## 2022-04-02 NOTE — Telephone Encounter (Signed)
Tina,Could you do me a favor could you do me a favor and let the patient know that I have reached out to pulmonary to see if we can expeditiously get somebody to see her and review her images with her.  I do not know if she is back in town yet.  If she is, could we also arrange for CTA on a pulmonary embolism protocol to make sure there is no blood clots.  Thanks Richardson Landry

## 2022-04-03 ENCOUNTER — Telehealth: Payer: Self-pay

## 2022-04-03 MED ORDER — PREDNISONE 50 MG PO TABS: ORAL_TABLET | ORAL | 0 refills | Status: DC

## 2022-04-03 NOTE — Telephone Encounter (Signed)
S/w Tonya ([facility] front office) and she will handle scheduling for patient since this may be a self referral

## 2022-04-03 NOTE — Addendum Note (Signed)
Addended by: Darrell Jewel on: 04/03/2022 10:23 AM   Modules accepted: Orders

## 2022-04-03 NOTE — Telephone Encounter (Signed)
Tried to call patient to set up a consult with [place] office and left message on vm.  Let her know that she could call the [place] number to schedule and if her insurance required a referral she could contact cardiology since there was not a referral in her chart.

## 2022-04-03 NOTE — Addendum Note (Signed)
Addended by: Darrell Jewel on: 04/03/2022 09:54 AM   Modules accepted: Orders

## 2022-04-03 NOTE — Telephone Encounter (Signed)
-----   Message from Chesley Mires, MD sent at 04/03/2022  8:05 AM EDT ----- Received message from cardiology.  Patient with progressive dyspnea and needs earliest available consult visit in [place] office.  Can be with any physician.  Thanks.  V

## 2022-04-04 ENCOUNTER — Other Ambulatory Visit: Payer: PPO

## 2022-04-04 ENCOUNTER — Other Ambulatory Visit: Payer: Self-pay | Admitting: *Deleted

## 2022-04-04 DIAGNOSIS — R0609 Other forms of dyspnea: Secondary | ICD-10-CM | POA: Diagnosis not present

## 2022-04-04 LAB — BASIC METABOLIC PANEL
BUN/Creatinine Ratio: 15 (ref 12–28)
BUN: 12 mg/dL (ref 8–27)
CO2: 26 mmol/L (ref 20–29)
Calcium: 9.6 mg/dL (ref 8.7–10.3)
Chloride: 102 mmol/L (ref 96–106)
Creatinine, Ser: 0.79 mg/dL (ref 0.57–1.00)
Glucose: 84 mg/dL (ref 70–99)
Potassium: 4.6 mmol/L (ref 3.5–5.2)
Sodium: 138 mmol/L (ref 134–144)
eGFR: 85 mL/min/{1.73_m2} (ref >59)

## 2022-04-08 NOTE — Telephone Encounter (Signed)
Left voicemail for patient to call back to schedule consultation and sent mychart message- closing encounter.

## 2022-04-09 ENCOUNTER — Telehealth: Payer: Self-pay

## 2022-04-09 NOTE — Telephone Encounter (Signed)
Spoke with pt and confirmed she has picked up prednisone and has benadryl to take prior to her CT scan scheduled for tomorrow 04/10/2022.  Reviewed instructions.  Pt verbalizes understanding and thanked Therapist, sports for the phone call.

## 2022-04-10 ENCOUNTER — Ambulatory Visit (HOSPITAL_COMMUNITY)
Admission: RE | Admit: 2022-04-10 | Discharge: 2022-04-10 | Disposition: A | Discharge status: Home / Self Care | Payer: PPO | Source: Ambulatory Visit | Attending: Internal Medicine | Admitting: Internal Medicine

## 2022-04-10 DIAGNOSIS — J9811 Atelectasis: Secondary | ICD-10-CM | POA: Diagnosis not present

## 2022-04-10 DIAGNOSIS — R0609 Other forms of dyspnea: Secondary | ICD-10-CM | POA: Insufficient documentation

## 2022-04-10 MED ORDER — IOHEXOL 350 MG/ML SOLN
80.0000 mL | Freq: Once | INTRAVENOUS | Status: AC | PRN
  Administered 2022-04-10: 80 mL via INTRAVENOUS

## 2022-04-10 MED ORDER — SODIUM CHLORIDE (PF) 0.9 % IJ SOLN
INTRAMUSCULAR | Status: AC
  Filled 2022-04-10: qty 50

## 2022-04-11 ENCOUNTER — Other Ambulatory Visit (HOSPITAL_COMMUNITY): Payer: PPO

## 2022-04-18 ENCOUNTER — Encounter: Payer: Self-pay | Admitting: Internal Medicine

## 2022-04-18 ENCOUNTER — Ambulatory Visit: Payer: PPO | Admitting: Internal Medicine

## 2022-04-18 VITALS — BP 120/80 | HR 77 | Temp 97.5°F | Ht 64.0 in | Wt 148.2 lb

## 2022-04-18 DIAGNOSIS — R0602 Shortness of breath: Secondary | ICD-10-CM

## 2022-04-18 MED ORDER — ALBUTEROL SULFATE HFA 108 (90 BASE) MCG/ACT IN AERS: 2.0000 | INHALATION_SPRAY | Freq: Four times a day (QID) | RESPIRATORY_TRACT | 2 refills | Status: DC | PRN

## 2022-04-21 ENCOUNTER — Telehealth: Payer: Self-pay | Admitting: Urology

## 2022-04-21 NOTE — Telephone Encounter (Addendum)
DOS - 06/19/22   [place] BUNIONECTOMY RIGHT --- 03888 METATARSAL OSTEOTOMY 2,3 RIGHT --- 28003 HAMMERTOE REPAIR 2ND RIGHT --- 49179   HTA EFFECTIVE DATE - 11/27/2018     RECEIVED FAX FROM [facility] STATING THAT CPT CODES 15056, 28308 AND 97948 HAVE BEEN APPROVED, AUTH # [id number], GOOD FROM 05/15/22 - 08/13/22.

## 2022-04-22 ENCOUNTER — Encounter: Payer: Self-pay | Admitting: Internal Medicine

## 2022-04-23 ENCOUNTER — Encounter: Payer: Self-pay | Admitting: Internal Medicine

## 2022-04-23 ENCOUNTER — Ambulatory Visit (INDEPENDENT_AMBULATORY_CARE_PROVIDER_SITE_OTHER): Payer: PPO | Admitting: Internal Medicine

## 2022-04-23 ENCOUNTER — Ambulatory Visit: Payer: PPO | Admitting: Neurology

## 2022-04-23 VITALS — BP 130/70 | HR 76 | Temp 97.7°F | Ht 64.0 in | Wt 144.0 lb

## 2022-04-23 DIAGNOSIS — G950 Syringomyelia and syringobulbia: Secondary | ICD-10-CM | POA: Diagnosis not present

## 2022-04-23 DIAGNOSIS — G441 Vascular headache, not elsewhere classified: Secondary | ICD-10-CM

## 2022-04-23 DIAGNOSIS — H538 Other visual disturbances: Secondary | ICD-10-CM

## 2022-04-23 DIAGNOSIS — R0602 Shortness of breath: Secondary | ICD-10-CM | POA: Diagnosis not present

## 2022-04-23 DIAGNOSIS — M542 Cervicalgia: Secondary | ICD-10-CM | POA: Diagnosis not present

## 2022-04-23 NOTE — Assessment & Plan Note (Signed)
?  progression. C/o SOB w/bending down (starting 3-4 months ago), face would turn flushed, red - resolves in 1 min after standing strait. SOB w/walking up the hill. Chest is tight all the time. Can't lay on the stomach due to SOB. C/o dropping things now.  We reproduced SOB w/bending down to pick up a magazine, face turned flushed, red - resolved in 1 min after standing strait.  Will order MRI of the brain, C spine, T-spine RTC in 1 mo F/u w/Dr Jalene Mullet ([facility]) if needed

## 2022-04-23 NOTE — Progress Notes (Signed)
Subjective:  Patient ID: Beth Huffman, female    DOB: 11-05-1960  Age: 61 y.o. MRN: [id number]  CC: No chief complaint on file.   HPI Beth Huffman presents for SOB w/bending down (starting 3-4 months ago), face would turn flushed, red - resolves in 1 min after standing strait. SOB w/walking up the hill. Chest is tight all the time. Can't lay on the stomach due to SOB. C/o dropping things now.   Outpatient Medications Prior to Visit  Medication Sig Dispense Refill   albuterol (VENTOLIN HFA) 108 (90 Base) MCG/ACT inhaler Inhale 2 puffs into the lungs every 6 (six) hours as needed for wheezing or shortness of breath. 18 g 2   cholecalciferol (VITAMIN D) 1000 UNITS tablet Take 1,000 Units by mouth daily.     cyclobenzaprine (FLEXERIL) 5 MG tablet TAKE 1 TABLET THREE TIMES DAILY AS NEEDED FOR MUSCLE SPASMS. 90 tablet 3   estradiol (ESTRACE) 0.5 MG tablet Take 1 tablet (0.5 mg total) by mouth daily. 90 tablet 4   famotidine (PEPCID) 40 MG tablet Take 1 tablet (40 mg total) by mouth daily. 90 tablet 3   ibuprofen (IBU) 600 MG tablet TAKE 1 TABLET EVERY 8 HOURS AS NEEDED FOR HEADACHE. 90 tablet 1   Multiple Vitamin (MULTIVITAMIN PO) Take 1 tablet by mouth daily.     Oxycodone HCl 20 MG TABS Take 0.5 tablets (10 mg total) by mouth 4 (four) times daily as needed. Please fill on or after 03/23/22 -please fill early due to travel plans. 60 tablet 0   pantoprazole (PROTONIX) 40 MG tablet Take 1 tablet (40 mg total) by mouth at bedtime. 90 tablet 3   polyethylene glycol (MIRALAX / GLYCOLAX) packet Take 8.5 g by mouth every other day.     rizatriptan (MAXALT) 10 MG tablet TAKE 1 TABLET AT ONSET OF MIGRAINE- MAY REPEAT ONCE IN 2 HOURS. LIMIT 2/24 HOURS. AVOID DAILY USE. 8 tablet 3   senna (SENOKOT) 8.6 MG tablet Take 1 tablet by mouth as needed for constipation.     Turmeric (QC TUMERIC COMPLEX) 500 MG CAPS Take by mouth.     Erenumab-aooe (AIMOVIG) 140 MG/ML SOAJ Inject 140 mg into the skin every  28 (twenty-eight) days. (Patient not taking: Reported on 04/18/2022) 1.12 mL 5   naproxen (NAPROSYN) 500 MG tablet Take 500 mg by mouth 2 (two) times daily.     SUMAtriptan (IMITREX) 100 MG tablet TAKE 1 TABLET AT ONSET OF MIGRAINE- MAY REPEAT ONCE IN 2 HOURS. LIMIT 2/24 HOURS. AVOID DAILY USE. 9 tablet 3   UBRELVY 100 MG TABS Take 100 mg by mouth as needed. (Patient not taking: Reported on 04/18/2022)     No facility-administered medications prior to visit.    ROS: Review of Systems  Constitutional:  Negative for activity change, appetite change, chills, fatigue and unexpected weight change.  HENT:  Negative for congestion, mouth sores and sinus pressure.   Eyes:  Positive for visual disturbance.  Respiratory:  Positive for chest tightness and shortness of breath. Negative for cough.   Gastrointestinal:  Negative for abdominal pain and nausea.  Genitourinary:  Negative for difficulty urinating, frequency and vaginal pain.  Musculoskeletal:  Positive for gait problem. Negative for back pain.  Skin:  Negative for pallor and rash.  Neurological:  Negative for dizziness, tremors, weakness, numbness and headaches.  Psychiatric/Behavioral:  Negative for confusion, decreased concentration, sleep disturbance and suicidal ideas.     Objective:  BP 130/70 (BP Location: Right Arm,  Patient Position: Sitting, Cuff Size: Normal)   Pulse 76   Temp 97.7 F (36.5 C) (Oral)   Ht '5\' 4"'$  (1.626 m)   Wt 144 lb (65.3 kg)   SpO2 98%   BMI 24.72 kg/m   BP Readings from Last 3 Encounters:  04/23/22 130/70  04/18/22 120/80  04/01/22 (!) 143/78    Wt Readings from Last 3 Encounters:  04/23/22 144 lb (65.3 kg)  04/18/22 148 lb 3.2 oz (67.2 kg)  04/01/22 166 lb 6.4 oz (75.5 kg)    Physical Exam Constitutional:      General: She is not in acute distress.    Appearance: She is well-developed. She is obese.  HENT:     Head: Normocephalic.     Right Ear: External ear normal.     Left Ear: External  ear normal.     Nose: Nose normal.  Eyes:     General:        Right eye: No discharge.        Left eye: No discharge.     Conjunctiva/sclera: Conjunctivae normal.     Pupils: Pupils are equal, round, and reactive to light.  Neck:     Thyroid: No thyromegaly.     Vascular: No JVD.     Trachea: No tracheal deviation.  Cardiovascular:     Rate and Rhythm: Normal rate and regular rhythm.     Heart sounds: Normal heart sounds.  Pulmonary:     Effort: No respiratory distress.     Breath sounds: No stridor. No wheezing.  Abdominal:     General: Bowel sounds are normal. There is no distension.     Palpations: Abdomen is soft. There is no mass.     Tenderness: There is no abdominal tenderness. There is no guarding or rebound.  Musculoskeletal:        General: No tenderness.     Cervical back: Normal range of motion and neck supple. No rigidity.  Lymphadenopathy:     Cervical: No cervical adenopathy.  Skin:    Findings: No erythema or rash.  Neurological:     Cranial Nerves: No cranial nerve deficit.     Motor: No abnormal muscle tone.     Coordination: Coordination normal.     Deep Tendon Reflexes: Reflexes normal.  Psychiatric:        Behavior: Behavior normal.        Thought Content: Thought content normal.        Judgment: Judgment normal.   We reproduced SOB w/bending down to pick up a magazine, face turned flushed, red - resolved in 1 min after standing strait.  R knee DTR +++ Grip is slightly weak B    A total time of 45 minutes was spent preparing to see the patient, reviewing tests, x-rays, operative reports and other medical records.  Also, obtaining history and performing comprehensive physical exam.  Additionally, counseling the patient regarding the above listed issues.   Finally, documenting clinical information in the health records, coordination of care, educating the patient re syringomyelia possible progression, SOB. It is a complex case.   Lab Results   Component Value Date   WBC 5.2 04/25/2021   HGB 13.3 04/25/2021   HCT 39.6 04/25/2021   PLT 210.0 04/25/2021   GLUCOSE 84 04/04/2022   CHOL 225 (H) 04/25/2021   TRIG 110.0 04/25/2021   HDL 70.00 04/25/2021   LDLDIRECT 130.2 12/21/2013   LDLCALC 133 (H) 04/25/2021   ALT 27 04/25/2021  AST 25 04/25/2021   NA 138 04/04/2022   K 4.6 04/04/2022   CL 102 04/04/2022   CREATININE 0.79 04/04/2022   BUN 12 04/04/2022   CO2 26 04/04/2022   TSH 1.81 04/25/2021   HGBA1C 5.5 03/31/2018    CT Angio Chest Pulmonary Embolism (PE) W or WO Contrast  Result Date: 04/10/2022 CLINICAL DATA:  A 61 year old female presents for evaluation of dyspnea. EXAM: CT ANGIOGRAPHY CHEST WITH CONTRAST TECHNIQUE: Multidetector CT imaging of the chest was performed using the standard protocol during bolus administration of intravenous contrast. Multiplanar CT image reconstructions and MIPs were obtained to evaluate the vascular anatomy. RADIATION DOSE REDUCTION: This exam was performed according to the departmental dose-optimization program which includes automated exposure control, adjustment of the mA and/or kV according to patient size and/or use of iterative reconstruction technique. CONTRAST:  59m OMNIPAQUE IOHEXOL 350 MG/ML SOLN COMPARISON:  January 13, 2022. FINDINGS: Cardiovascular: The aorta a displays a normal caliber with smooth contours and standard three-vessel branching pattern. Scattered aortic calcifications. No acute aortic process. Heart size mildly enlarged.  No pericardial effusion or nodularity. Central pulmonary vasculature is of normal caliber and is opacified to 4 and 3 Hounsfield units and is negative four evidence of pulmonary embolism. Mediastinum/Nodes: Anterior mediastinal nodularity measuring 16 x 14 mm previously 20 x 13 mm overall unchanged in size. No thoracic inlet adenopathy. No signs of additional mediastinal adenopathy or lesion. Esophagus mildly patulous. No axillary lymphadenopathy.  Lungs/Pleura: Basilar atelectasis. No effusion. No consolidation. Airways are patent. Upper Abdomen: Incidental imaging of upper abdominal contents is unremarkable. Imaged portions the liver, pancreas, spleen, adrenal glands and kidneys without acute findings. Musculoskeletal: No chest wall abnormality. No acute or significant osseous findings. Review of the MIP images confirms the above findings. IMPRESSION: 1. Negative for pulmonary embolism. 2. No acute cardiopulmonary process. 3. Anterior mediastinal nodularity measuring 16 x 14 mm previously 20 x 13 mm overall perhaps slightly changed in configuration, potentially related to cystic nature though not clear based on density. As such MRI on a nonemergent basis is suggested for further evaluation as outlined in the PET-CT report from April of 2023. Aortic Atherosclerosis (ICD10-I70.0). Electronically Signed   By: GZetta BillsM.D.   On: 04/10/2022 09:33    Assessment & Plan:   Problem List Items Addressed This Visit     Blurry vision    Ordered brain MRI      Dyspnea    SOB w/bending down (starting 3-4 months ago), face would turn flushed, red - resolves in 1 min after standing strait. SOB w/walking up the hill. Chest is tight all the time. Can't lay on the stomach due to SOB. C/o dropping things now.  We reproduced SOB w/bending down to pick up a magazine, face turned flushed, red - resolved in 1 min after standing strait.  Will order MRI of the brain, C spine, T-spine RTC in 1 mo F/u w/Dr FJalene Mullet([facility]) if needed      Relevant Orders   MR Brain Wo Contrast   MR Cervical Spine Wo Contrast   MR Thoracic Spine Wo Contrast   Headache    Ordered brain MRI      Relevant Medications   naproxen (NAPROSYN) 500 MG tablet   Other Relevant Orders   MR Brain Wo Contrast   NECK PAIN   Relevant Orders   MR Brain Wo Contrast   MR Cervical Spine Wo Contrast   SYRINGOMYELIA - Primary    ?progression. C/o SOB  w/bending down (starting 3-4  months ago), face would turn flushed, red - resolves in 1 min after standing strait. SOB w/walking up the hill. Chest is tight all the time. Can't lay on the stomach due to SOB. C/o dropping things now.  We reproduced SOB w/bending down to pick up a magazine, face turned flushed, red - resolved in 1 min after standing strait.  Will order MRI of the brain, C spine, T-spine RTC in 1 mo F/u w/Dr Jalene Mullet ([facility]) if needed      Relevant Orders   MR Brain Wo Contrast   MR Cervical Spine Wo Contrast   MR Thoracic Spine Wo Contrast      No orders of the defined types were placed in this encounter.     Follow-up: Return in about 4 weeks (around 05/21/2022) for a follow-up visit.  Walker Kehr, MD

## 2022-04-23 NOTE — Assessment & Plan Note (Addendum)
SOB w/bending down (starting 3-4 months ago), face would turn flushed, red - resolves in 1 min after standing strait. SOB w/walking up the hill. Chest is tight all the time. Can't lay on the stomach due to SOB. C/o dropping things now.  We reproduced SOB w/bending down to pick up a magazine, face turned flushed, red - resolved in 1 min after standing strait. Will order MRI of the brain, C spine, T-spine RTC in 1 mo F/u w/Dr Jalene Mullet ([facility]) if needed

## 2022-04-23 NOTE — Assessment & Plan Note (Signed)
Ordered brain MRI

## 2022-05-07 ENCOUNTER — Ambulatory Visit
Admission: RE | Admit: 2022-05-07 | Discharge: 2022-05-07 | Disposition: A | Discharge status: Home / Self Care | Payer: PPO | Source: Ambulatory Visit | Attending: Internal Medicine | Admitting: Internal Medicine

## 2022-05-07 ENCOUNTER — Encounter: Payer: Self-pay | Admitting: Internal Medicine

## 2022-05-07 DIAGNOSIS — R0602 Shortness of breath: Secondary | ICD-10-CM

## 2022-05-07 DIAGNOSIS — G441 Vascular headache, not elsewhere classified: Secondary | ICD-10-CM

## 2022-05-07 DIAGNOSIS — G950 Syringomyelia and syringobulbia: Secondary | ICD-10-CM

## 2022-05-07 DIAGNOSIS — Z9889 Other specified postprocedural states: Secondary | ICD-10-CM | POA: Diagnosis not present

## 2022-05-07 DIAGNOSIS — M5124 Other intervertebral disc displacement, thoracic region: Secondary | ICD-10-CM | POA: Diagnosis not present

## 2022-05-07 DIAGNOSIS — Z8669 Personal history of other diseases of the nervous system and sense organs: Secondary | ICD-10-CM | POA: Diagnosis not present

## 2022-05-07 DIAGNOSIS — R519 Headache, unspecified: Secondary | ICD-10-CM | POA: Diagnosis not present

## 2022-05-07 DIAGNOSIS — M47812 Spondylosis without myelopathy or radiculopathy, cervical region: Secondary | ICD-10-CM | POA: Diagnosis not present

## 2022-05-07 DIAGNOSIS — M542 Cervicalgia: Secondary | ICD-10-CM

## 2022-05-08 ENCOUNTER — Encounter: Payer: Self-pay | Admitting: Internal Medicine

## 2022-05-08 NOTE — Telephone Encounter (Signed)
Opened in error

## 2022-05-12 ENCOUNTER — Encounter: Payer: Self-pay | Admitting: Internal Medicine

## 2022-05-12 ENCOUNTER — Other Ambulatory Visit: Payer: Self-pay | Admitting: Internal Medicine

## 2022-05-12 DIAGNOSIS — G950 Syringomyelia and syringobulbia: Secondary | ICD-10-CM

## 2022-05-12 NOTE — Telephone Encounter (Signed)
   Patient Name: Beth Huffman  DOB: Mar 23, 1961 MRN: [id number]  Primary Cardiologist: None  Chart reviewed as part of pre-operative protocol coverage. Patient was recently seen by Dr. Caryl Comes for dyspnea.   Had reassuring stress test and BNP. Referred to pulmonary for further evaluation,   Given past medical history and time since last visit, based on ACC/AHA guidelines, Beth Huffman would be at acceptable risk for the planned procedure without further cardiovascular testing.   I will route this recommendation to the requesting party via Epic fax function and remove from pre-op pool.  Please call with questions.  [place], [place] 05/12/2022, 12:15 PM

## 2022-05-12 NOTE — Telephone Encounter (Signed)
They are calling for clearance patient has surgery on 7/20. They states needed clearance back today

## 2022-05-13 ENCOUNTER — Telehealth: Payer: Self-pay | Admitting: Internal Medicine

## 2022-05-13 NOTE — Telephone Encounter (Signed)
Hi Amanda, This patient is having ongoing dyspnea but her appointment for PFT and with me is still a few weeks out. Can you call and offer her appointment for PFT sooner if available? She can see me on a different day for follow up if we can't coordinate.

## 2022-05-13 NOTE — Telephone Encounter (Signed)
There are currently no opening for Full PFT before her already scheduled appointment on 06/03/22.

## 2022-05-13 NOTE — Telephone Encounter (Signed)
Calling to f/u on Medical Clearance for pt. Office would like to know if Clearance can be printed off and faxed to them asap. They state that they need this today being that pt is scheduled to have procedure on 05/15/22. Please advise

## 2022-05-13 NOTE — Telephone Encounter (Signed)
Per pre op provider I will re-fax clearance notes that were previously faxed.

## 2022-05-13 NOTE — Telephone Encounter (Signed)
[facility] states the y did not get the clearance for the patient. Please re-send fax to [phone]

## 2022-05-14 ENCOUNTER — Telehealth: Payer: Self-pay | Admitting: Urology

## 2022-05-14 NOTE — Telephone Encounter (Signed)
Pt called and stated that she has to have a pulmonary function tests (PFT) done on 05/30/22. We have gotten clearance from Cardio. Waiting to see what test results say from pulmonary and if they will clear her for sx. Pt stated that she will just call back after test results and pulmonary clearance to reschedule her sx with Dr. Amalia Hailey. I have notified Dr. Amalia Hailey and Caren Griffins with [facility] of this change.

## 2022-05-19 ENCOUNTER — Other Ambulatory Visit: Payer: Self-pay | Admitting: Internal Medicine

## 2022-05-21 ENCOUNTER — Encounter: Payer: PPO | Admitting: Podiatry

## 2022-05-21 ENCOUNTER — Other Ambulatory Visit: Payer: Self-pay | Admitting: *Deleted

## 2022-05-21 ENCOUNTER — Telehealth: Payer: Self-pay

## 2022-05-21 MED ORDER — OXYCODONE HCL 10 MG PO TABS: 10.0000 mg | ORAL_TABLET | Freq: Four times a day (QID) | ORAL | 0 refills | Status: DC | PRN

## 2022-05-21 MED ORDER — RIZATRIPTAN BENZOATE 10 MG PO TABS
ORAL_TABLET | ORAL | 3 refills | Status: DC
Start: 2022-05-21 — End: 2022-09-04

## 2022-05-21 NOTE — Telephone Encounter (Signed)
Pharmacy is calling due to being out of stock of Oxycodone HCl 20 MG TABS. They currently only have Oxycodone HCl 10 MG TABS. Pharmacist is wanting the ok to use the 10 mg. Pt only has 1 Tab remaining.   Please advise

## 2022-05-21 NOTE — Telephone Encounter (Signed)
Ok done erx 

## 2022-05-21 NOTE — Addendum Note (Signed)
Addended by: Biagio Borg on: 05/21/2022 03:13 PM   Modules accepted: Orders

## 2022-05-21 NOTE — Telephone Encounter (Signed)
Check [place] registry last filled Oxycodone 10 mg 04/20/2022. MD out of office pls advise.Marland KitchenChryl Heck

## 2022-05-28 ENCOUNTER — Encounter: Payer: PPO | Admitting: Podiatry

## 2022-05-30 ENCOUNTER — Ambulatory Visit (INDEPENDENT_AMBULATORY_CARE_PROVIDER_SITE_OTHER): Payer: PPO | Admitting: Internal Medicine

## 2022-05-30 ENCOUNTER — Encounter: Payer: Self-pay | Admitting: Internal Medicine

## 2022-05-30 ENCOUNTER — Ambulatory Visit: Payer: PPO | Admitting: Internal Medicine

## 2022-05-30 VITALS — BP 110/70 | HR 74

## 2022-05-30 DIAGNOSIS — R0602 Shortness of breath: Secondary | ICD-10-CM

## 2022-05-30 DIAGNOSIS — J4520 Mild intermittent asthma, uncomplicated: Secondary | ICD-10-CM | POA: Diagnosis not present

## 2022-05-30 LAB — PULMONARY FUNCTION TEST
DL/VA % pred: 102 %
DL/VA: 4.29 ml/min/mmHg/L
DLCO cor % pred: 116 %
DLCO cor: 23.44 ml/min/mmHg
DLCO unc % pred: 116 %
DLCO unc: 23.44 ml/min/mmHg
FEF 25-75 Post: 3.46 L/sec
FEF 25-75 Pre: 2.71 L/sec
FEF2575-%Change-Post: 27 %
FEF2575-%Pred-Post: 152 %
FEF2575-%Pred-Pre: 119 %
FEV1-%Change-Post: 7 %
FEV1-%Pred-Post: 120 %
FEV1-%Pred-Pre: 112 %
FEV1-Post: 3.02 L
FEV1-Pre: 2.82 L
FEV1FVC-%Change-Post: 3 %
FEV1FVC-%Pred-Pre: 102 %
FEV6-%Change-Post: 3 %
FEV6-%Pred-Post: 117 %
FEV6-%Pred-Pre: 113 %
FEV6-Post: 3.67 L
FEV6-Pre: 3.55 L
FEV6FVC-%Pred-Post: 103 %
FEV6FVC-%Pred-Pre: 103 %
FVC-%Change-Post: 3 %
FVC-%Pred-Post: 112 %
FVC-%Pred-Pre: 109 %
FVC-Post: 3.67 L
FVC-Pre: 3.55 L
Post FEV1/FVC ratio: 82 %
Post FEV6/FVC ratio: 100 %
Pre FEV1/FVC ratio: 79 %
Pre FEV6/FVC Ratio: 100 %
RV % pred: 75 %
RV: 1.52 L
TLC % pred: 102 %
TLC: 5.17 L

## 2022-05-30 NOTE — Patient Instructions (Signed)
Please schedule follow up scheduled with myself in 7 months.  If my schedule is not open yet, we will contact you with a reminder closer to that time. Please call [phone] if you haven't heard from [place] a month before.   I will see you back in the spring. I suspect most of your symptoms are related to mild asthma. Lung function was otherwise normal.  No problem with you having foot surgery - good luck! Let me know if any issues with your breathing before we touch base again.   Take the albuterol rescue inhaler every 4 to 6 hours as needed for wheezing or shortness of breath. You can also take it 15 minutes before exercise or exertional activity. Side effects include heart racing or pounding, jitters or anxiety. If you have a history of an irregular heart rhythm, it can make this worse. Can also give some patients a hard time sleeping.

## 2022-05-30 NOTE — Progress Notes (Signed)
Full PFT performed today. °

## 2022-05-30 NOTE — Patient Instructions (Signed)
Full PFT performed today. °

## 2022-05-30 NOTE — Progress Notes (Signed)
Beth Huffman    [id number]    1961/05/27  Primary Care Physician:Plotnikov, Evie Lacks, MD Date of Appointment: 05/30/2022 Established Patient Visit  Chief complaint:   Chief Complaint  Patient presents with   Follow-up    Pt f/u has had some flare ups while gardening, but pt states everything has been good     HPI: Beth Huffman is a 61 y.o. 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   Interval Updates: Here for follow up after PFTs.   Has used albuterol 2-3 times since last visit, usually with gardening. Thinks it has helped.  Otherwise she is feeling pretty good!   We reviewed her most recent imaging from Dr. Alain Marion as well as her PET scan for evaluation of her anterior mediastinal mass.   She denies coughing, weakness, fatigue. She would like to get back to swimming and gardening.   I have reviewed the patient's family social and past medical history and updated as appropriate.   Past Medical History:  Diagnosis Date   Abnormal weight gain    Acute sinusitis 12/05/2008   1/18 8/18   Arachnoid cyst 08/19/2011   Chest tightness or pressure 05/07/2018   Constipation 09/08/2012   Chronic - opioid related 11/17 Amitiza   Contact dermatitis 03/16/2013   5/14 relapsing - poison ivy    Dyspnea 05/07/2018   Elevated liver enzymes 03/04/2011   Chronic off and on    Female stress incontinence 02/06/2009   Qualifier: Diagnosis of  By: Alain Marion MD, Evie Lacks    FEVER UNSPECIFIED 09/29/2007   Qualifier: Diagnosis of  By: Alain Marion MD, Evie Lacks    GERD 09/29/2007   Chronic  Protonix prn  Potential benefits of a long term PPI use as well as potential risks  and complications were explained to the patient and were aknowledged.     GERD (gastroesophageal reflux disease)    Headache 12/20/2007   Dr Hassell Done Migraines (1 per 2 wks) Topamax 400 mg/d;  Imitrex prn; Ibuprofen prn, Zofran prn    Hyperglycemia 08/19/2011   Mild     Hypertension    Increased blood pressure (not hypertension) 08/23/2014   Intractable migraine 06/14/2009   Chronic Ibuprofen, Imitrex, Topamax,  Nortriptyline - intolerant   Knee pain 03/04/2011   R knee 2018 L knee pain - L knee b anserina and L knee is tender w/ROM: L knee pain is worse - patellofemoral syndrome vs other   LBP (low back pain)    Local anesthetic drug adverse reaction 01/21/2022   LOW BACK PAIN 09/29/2007   Chronic, lately (2016) disabling Due to syringomyelia On Oxycodone  Potential benefits of a long term opioids use as well as potential risks (i.e. addiction risk, apnea etc) and complications (i.e. Somnolence, constipation and others) were explained to the patient and were aknowledged.     LUQ PAIN 05/23/2010   Qualifier: Diagnosis of  By: Plotnikov MD, Evie Lacks    Migraine, chronic, without aura, intractable 10/28/2017   Botox approved diagnosis. 7/19 Worse. Start Emagility inj. Relpax   Migraines    Nausea & vomiting 08/19/2011   Nausea without vomiting 06/14/2009   Due to migraines     NECK PAIN 06/14/2009    Chronic, lately (2016) disabling Due to syringomyelia On Oxycodone  Potential benefits of a long term opioids use as well as potential risks (  i.e. addiction risk, apnea etc) and complications (i.e. Somnolence, constipation and others) were explained to the patient and were aknowledged.     NONSPECIFIC ABNORMAL RESULTS LIVR FUNCTION STUDY 09/28/2009   Qualifier: Diagnosis of  By: Alain Marion MD, Evie Lacks    OBSESSIVE-COMPULSIVE DISORDER 05/23/2010   Chronic - compulsive shopping    Onychomycosis 01/30/2016   4/17    Orthostatic hypotension 06/30/2013   9/14 likely Rapaflo induced    OTITIS EXTERNA 05/10/2008   Qualifier: Diagnosis of  By: Alain Marion MD, Evie Lacks    Palpitations 06/21/2014   PARESTHESIA 07/13/2008   Qualifier: Diagnosis of  By: Alain Marion MD, Evie Lacks    Partial thickness burn of  left wrist 09/11/2015   Postoperative state 07/24/2016   Pyelonephritis 2008   Rash 07/27/2017   Stress 2009   Syncope, near 06/30/2013   9/14 likely Rapaflo induced 12/14 relapsing off Rapaflo x 7 2/15 no relapsing since on 1/2 dose of Topamax    Syringobulbia ([place])    SYRINGOMYELIA 08/20/2007   Chronic Dr Krista Blue Dr Jalene Mullet at [facility] Chronic pain    Syringomyelia ([place])    Tachycardia    Tonsillitis, chronic 02/26/2017   2018 worse   URINARY TRACT INFECTION (UTI) 03/23/2008   Qualifier: Diagnosis of  By: Alain Marion MD, Evie Lacks    UTI (lower urinary tract infection)    Well adult exam 12/21/2013   We discussed age appropriate health related issues, including available/recomended screening tests and vaccinations. We discussed a need for adhering to healthy diet and exercise. Labs/EKG were reviewed/ordered. All questions were answered.       Past Surgical History:  Procedure Laterality Date   ABDOMINAL HYSTERECTOMY     ANTERIOR AND POSTERIOR REPAIR N/A 07/24/2016   Procedure: Possible ANTERIOR (CYSTOCELE) repair, perineoplasty;  Surgeon: Princess Bruins, MD;  Location: [facility];  Service: Gynecology;  Laterality: N/A;   BLADDER SUSPENSION N/A 07/24/2016   Procedure: TRANSVAGINAL TAPE (TVT) PROCEDURE;  Surgeon: Princess Bruins, MD;  Location: [facility];  Service: Gynecology;  Laterality: N/A;   COLONOSCOPY     CYSTOSCOPY N/A 07/24/2016   Procedure: CYSTOSCOPY;  Surgeon: Princess Bruins, MD;  Location: [facility];  Service: Gynecology;  Laterality: N/A;   intracranial decompression surgery  2006   LEFT HEART CATH AND CORONARY ANGIOGRAPHY N/A 05/07/2018   Procedure: LEFT HEART CATH AND CORONARY ANGIOGRAPHY;  Surgeon: Belva Crome, MD;  Location: [facility];  Service: Cardiovascular;  Laterality: N/A;   PARTIAL KNEE ARTHROPLASTY Left 09/07/2018   Procedure: UNICOMPARTMENTAL LEFT KNEE;  Surgeon: Renette Butters, MD;  Location: [facility];  Service: Orthopedics;  Laterality: Left;  Adductor Block    ROBOTIC ASSISTED LAPAROSCOPIC SACROCOLPOPEXY N/A 07/24/2016   Procedure: ROBOTIC ASSISTED LAPAROSCOPIC SACROCOLPOPEXY WITH PERINEOPLASTY;  Surgeon: Princess Bruins, MD;  Location: [facility];  Service: Gynecology;  Laterality: N/A;   TUBAL LIGATION      Family History  Problem Relation Age of Onset   COPD Mother    Peripheral vascular disease Mother    Hypertension Mother    Heart disease Mother    Hypertension Father    Heart attack Maternal Grandmother    Stroke Maternal Grandfather    Stroke Paternal Grandfather    Breast cancer Maternal Aunt    Colon cancer Maternal Uncle 60   Anxiety disorder Other    Asthma Granddaughter     Social History   Occupational History   Occupation: Office manager: SELF-EMPLOYED  Tobacco Use  Smoking status: Never    Passive exposure: Past   Smokeless tobacco: Never  Vaping Use   Vaping Use: Never used  Substance and Sexual Activity   Alcohol use: No   Drug use: No   Sexual activity: Yes    Partners: Male    Birth control/protection: Surgical    Comment: btl, hysterectomy     Physical Exam: Blood pressure 110/70, pulse 74, SpO2 97 %.  Gen:      No acute distress Lungs:    No increased respiratory effort, symmetric chest wall excursion, clear to auscultation bilaterally, no wheezes or crackles CV:         Regular rate and rhythm; no murmurs, rubs, or gallops.  No pedal edema   Data Reviewed: Imaging: I have personally reviewed the PET CT from April 2023 - shows thymic cyst, not pet avid. CTPE study June 2023 shows negative PE study.   PFTs:     Latest Ref Rng & Units 05/30/2022    9:58 AM  PFT Results  FVC-Pre L 3.55  P  FVC-Predicted Pre % 109  P  FVC-Post L 3.67  P  FVC-Predicted Post % 112  P  Pre FEV1/FVC % % 79  P  Post FEV1/FCV % % 82  P  FEV1-Pre L 2.82  P  FEV1-Predicted Pre % 112  P  FEV1-Post L 3.02  P  DLCO uncorrected ml/min/mmHg 23.44  P  DLCO UNC% % 116  P  DLCO corrected ml/min/mmHg  23.44  P  DLCO COR %Predicted % 116  P  DLVA Predicted % 102  P  TLC L 5.17  P  TLC % Predicted % 102  P  RV % Predicted % 75  P    P Preliminary result   I have personally reviewed the patient's PFTs and normal PFT with +BD response  Labs:  Immunization status: Immunization History  Administered Date(s) Administered   Influenza, High Dose Seasonal PF 12/12/2021   Influenza,inj,Quad PF,6+ Mos 09/14/2013, 06/27/2015, 07/25/2016, 07/27/2017, 08/03/2018, 07/29/2019, 07/31/2020, 07/31/2021   Influenza-Unspecified 09/13/2014   Moderna Sars-Covid-2 Vaccination 01/04/2020, 01/25/2020   PFIZER(Purple Top)SARS-COV-2 Vaccination 08/19/2020, 12/12/2021   Pfizer Covid-19 Vaccine Bivalent Booster 43yr & up 07/15/2021   Pneumococcal Polysaccharide-23 07/25/2016   Tdap 09/28/2015   Zoster Recombinat (Shingrix) 09/04/2021, 11/18/2021    External Records Personally Reviewed: Cardiology, PCP  Assessment:  Mild intermittent asthma Passive smoke exposure in childhood  Plan/Recommendations: Ms. Bitner's symptoms are well controlled on current prn albuterol use which is less than once a week. I recommend she continue this for now. She has no restrictions on activity or exertion from a pulmonary perspective. Her PFTs show normal pulmonary function with some bronchial hyperresponsiveness. No evidence of NM weakness.   I will see her back in a few months and monitor her response to therapy over time.   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 And is not likely to be contributing to any respiratory symptoms.     Return to Care: Return in about 7 months (around 12/29/2022).   NLenice Llamas MD [facility]

## 2022-06-09 ENCOUNTER — Ambulatory Visit (INDEPENDENT_AMBULATORY_CARE_PROVIDER_SITE_OTHER): Payer: PPO | Admitting: Internal Medicine

## 2022-06-09 ENCOUNTER — Encounter: Payer: Self-pay | Admitting: Internal Medicine

## 2022-06-09 ENCOUNTER — Ambulatory Visit (INDEPENDENT_AMBULATORY_CARE_PROVIDER_SITE_OTHER): Payer: PPO

## 2022-06-09 DIAGNOSIS — G8929 Other chronic pain: Secondary | ICD-10-CM

## 2022-06-09 DIAGNOSIS — G43719 Chronic migraine without aura, intractable, without status migrainosus: Secondary | ICD-10-CM

## 2022-06-09 DIAGNOSIS — G950 Syringomyelia and syringobulbia: Secondary | ICD-10-CM | POA: Diagnosis not present

## 2022-06-09 DIAGNOSIS — R0602 Shortness of breath: Secondary | ICD-10-CM | POA: Diagnosis not present

## 2022-06-09 DIAGNOSIS — M25521 Pain in right elbow: Secondary | ICD-10-CM

## 2022-06-09 DIAGNOSIS — M5441 Lumbago with sciatica, right side: Secondary | ICD-10-CM

## 2022-06-09 DIAGNOSIS — M5442 Lumbago with sciatica, left side: Secondary | ICD-10-CM | POA: Diagnosis not present

## 2022-06-09 MED ORDER — ALBUTEROL SULFATE HFA 108 (90 BASE) MCG/ACT IN AERS: 2.0000 | INHALATION_SPRAY | Freq: Four times a day (QID) | RESPIRATORY_TRACT | 2 refills | Status: DC | PRN

## 2022-06-09 MED ORDER — OXYCODONE HCL 10 MG PO TABS: 10.0000 mg | ORAL_TABLET | Freq: Four times a day (QID) | ORAL | 0 refills | Status: DC | PRN

## 2022-06-09 NOTE — Assessment & Plan Note (Signed)
On Oxycodone  Potential benefits of a long term opioids use as well as potential risks (i.e. addiction risk, apnea etc) and complications (i.e. Somnolence, constipation and others) were explained to the patient and were aknowledged.

## 2022-06-09 NOTE — Progress Notes (Signed)
Subjective:  Patient ID: Beth Huffman, female    DOB: Oct 10, 1961  Age: 61 y.o. MRN: [id number]  CC: No chief complaint on file.   HPI REYONNA HAACK presents for R elbow pain (pt fell in May and hit the curb at [employer]) F/u LBP, neck pain C/o stress w/dtr    Outpatient Medications Prior to Visit  Medication Sig Dispense Refill   albuterol (VENTOLIN HFA) 108 (90 Base) MCG/ACT inhaler Inhale 2 puffs into the lungs every 6 (six) hours as needed for wheezing or shortness of breath. 18 g 2   cholecalciferol (VITAMIN D) 1000 UNITS tablet Take 1,000 Units by mouth daily.     cyclobenzaprine (FLEXERIL) 5 MG tablet TAKE 1 TABLET THREE TIMES DAILY AS NEEDED FOR MUSCLE SPASMS. 90 tablet 3   Erenumab-aooe (AIMOVIG) 140 MG/ML SOAJ Inject 140 mg into the skin every 28 (twenty-eight) days. 1.12 mL 5   estradiol (ESTRACE) 0.5 MG tablet Take 1 tablet (0.5 mg total) by mouth daily. 90 tablet 4   famotidine (PEPCID) 40 MG tablet Take 1 tablet (40 mg total) by mouth daily. 90 tablet 3   ibuprofen (IBU) 600 MG tablet TAKE ONE TABLET EVERY 8 HOURS AS NEEDED FOR HEADACHE 90 tablet 1   Multiple Vitamin (MULTIVITAMIN PO) Take 1 tablet by mouth daily.     Oxycodone HCl 10 MG TABS Take 1 tablet (10 mg total) by mouth 4 (four) times daily as needed. 120 tablet 0   pantoprazole (PROTONIX) 40 MG tablet Take 1 tablet (40 mg total) by mouth at bedtime. 90 tablet 3   polyethylene glycol (MIRALAX / GLYCOLAX) packet Take 8.5 g by mouth every other day.     rizatriptan (MAXALT) 10 MG tablet TAKE 1 TABLET AT ONSET OF MIGRAINE- MAY REPEAT ONCE IN 2 HOURS. LIMIT 2/24 HOURS. AVOID DAILY USE. 8 tablet 3   senna (SENOKOT) 8.6 MG tablet Take 1 tablet by mouth as needed for constipation.     Turmeric (QC TUMERIC COMPLEX) 500 MG CAPS Take by mouth.     UBRELVY 100 MG TABS Take by mouth.     verapamil (CALAN-SR) 120 MG CR tablet Take 120 mg by mouth at bedtime.     No facility-administered medications prior to visit.     ROS: Review of Systems  Constitutional:  Positive for fatigue. Negative for activity change, appetite change, chills and unexpected weight change.  HENT:  Negative for congestion, mouth sores and sinus pressure.   Eyes:  Negative for visual disturbance.  Respiratory:  Negative for cough and chest tightness.   Gastrointestinal:  Negative for abdominal pain and nausea.  Genitourinary:  Negative for difficulty urinating, frequency and vaginal pain.  Musculoskeletal:  Positive for arthralgias, back pain, neck pain and neck stiffness. Negative for gait problem.  Skin:  Negative for pallor and rash.  Neurological:  Negative for dizziness, tremors, weakness, numbness and headaches.  Psychiatric/Behavioral:  Negative for confusion and sleep disturbance. The patient is nervous/anxious.     Objective:  BP 112/78 (BP Location: Right Arm, Patient Position: Sitting, Cuff Size: Normal)   Pulse 79   Temp 98.1 F (36.7 C) (Oral)   Ht '5\' 4"'$  (1.626 m)   Wt 147 lb (66.7 kg)   SpO2 95%   BMI 25.23 kg/m   BP Readings from Last 3 Encounters:  06/09/22 112/78  05/30/22 110/70  04/23/22 130/70    Wt Readings from Last 3 Encounters:  06/09/22 147 lb (66.7 kg)  04/23/22 144 lb (  65.3 kg)  04/18/22 148 lb 3.2 oz (67.2 kg)    Physical Exam Constitutional:      General: She is not in acute distress.    Appearance: She is well-developed. She is obese.  HENT:     Head: Normocephalic.     Right Ear: External ear normal.     Left Ear: External ear normal.     Nose: Nose normal.  Eyes:     General:        Right eye: No discharge.        Left eye: No discharge.     Conjunctiva/sclera: Conjunctivae normal.     Pupils: Pupils are equal, round, and reactive to light.  Neck:     Thyroid: No thyromegaly.     Vascular: No JVD.     Trachea: No tracheal deviation.  Cardiovascular:     Rate and Rhythm: Normal rate and regular rhythm.     Heart sounds: Normal heart sounds.  Pulmonary:     Effort:  No respiratory distress.     Breath sounds: No stridor. No wheezing.  Abdominal:     General: Bowel sounds are normal. There is no distension.     Palpations: Abdomen is soft. There is no mass.     Tenderness: There is no abdominal tenderness. There is no guarding or rebound.  Musculoskeletal:        General: No tenderness.     Cervical back: Normal range of motion and neck supple. No rigidity.  Lymphadenopathy:     Cervical: No cervical adenopathy.  Skin:    Findings: No erythema or rash.  Neurological:     Mental Status: She is oriented to person, place, and time.     Cranial Nerves: No cranial nerve deficit.     Motor: No abnormal muscle tone.     Coordination: Coordination normal.     Deep Tendon Reflexes: Reflexes normal.  Psychiatric:        Behavior: Behavior normal.        Thought Content: Thought content normal.        Judgment: Judgment normal.     Lab Results  Component Value Date   WBC 5.2 04/25/2021   HGB 13.3 04/25/2021   HCT 39.6 04/25/2021   PLT 210.0 04/25/2021   GLUCOSE 84 04/04/2022   CHOL 225 (H) 04/25/2021   TRIG 110.0 04/25/2021   HDL 70.00 04/25/2021   LDLDIRECT 130.2 12/21/2013   LDLCALC 133 (H) 04/25/2021   ALT 27 04/25/2021   AST 25 04/25/2021   NA 138 04/04/2022   K 4.6 04/04/2022   CL 102 04/04/2022   CREATININE 0.79 04/04/2022   BUN 12 04/04/2022   CO2 26 04/04/2022   TSH 1.81 04/25/2021   HGBA1C 5.5 03/31/2018    MR Thoracic Spine Wo Contrast  Result Date: 05/08/2022 CLINICAL DATA:  Syringomyelia EXAM: MRI THORACIC SPINE WITHOUT CONTRAST TECHNIQUE: Multiplanar, multisequence MR imaging of the thoracic spine was performed. No intravenous contrast was administered. COMPARISON:  MRI 04/18/2008 FINDINGS: Alignment:  Normal thoracic alignment. Vertebrae: No fracture or focal bone lesion. Cord: The cord is normal from T1 to the level of the T3-4 disc. There is dilatation of the central canal or a thoracic cord syrinx extending from the T3-4  disc level as far as the T11-12 disc level. Largest diameter is at the lower T4 level measuring 3 mm and at the T10 level measuring 3.6 mm. This is actually smaller than was seen in 2009 when the  diameter at lower T4 measured 3.2 mm and at T10 with a maximal diameter was also 3.6 mm but there was a fuller/more rounded configuration. Paraspinal and other soft tissues: Otherwise normal. Few incidental root sleeve cysts. Disc levels: No compressive disc pathology. Minimal midthoracic disc bulges but wide patency of the canal and foramina. IMPRESSION: No progressive or worsening finding compared to the study of 2009. Thoracic syrinx extending from the level of the T3-4 disc to the T11-12 disc level. The syrinx is actually slightly smaller when compared to the prior study, maximal transverse diameter at T4 measuring 3 mm today compared with 3.2 mm previously and with slight reduction in transverse diameter in the lower thoracic region, though the maximal measurement at the T10 level remains 3.6 mm. Electronically Signed   By: Nelson Chimes M.D.   On: 05/08/2022 15:55   MR Cervical Spine Wo Contrast  Result Date: 05/08/2022 CLINICAL DATA:  Syringomyelia.  Prior brain surgery. EXAM: MRI CERVICAL SPINE WITHOUT CONTRAST TECHNIQUE: Multiplanar, multisequence MR imaging of the cervical spine was performed. No intravenous contrast was administered. COMPARISON:  04/18/2008 partial examination FINDINGS: Alignment: Normal Vertebrae: Normal Cord: As described previously, there is mild prominence of the central canal from the upper C6 vertebral body level to the C7-T1 disc level. Maximal transverse diameter is 2 mm, no larger than what was described in 2009. Otherwise the cord signal is normal. 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 Disc levels: No significant disc level pathology. No disc herniation or stenosis of the canal or foramina. The patient does have facet  osteoarthritis on the right at C3-4 and C4-5 with small synovial cysts but no compressive effect upon the neural structures. IMPRESSION: Prominence of the central canal from upper C6 to the level of the C7-T1 disc, maximal transverse diameter 2 mm, the same as was described in 2009. No compressive pathology of the canal or foramina. Mild facet osteoarthritis on the right at C3-4 and C4-5. Electronically Signed   By: Nelson Chimes M.D.   On: 05/08/2022 15:48   MR Brain Wo Contrast  Result Date: 05/08/2022 CLINICAL DATA:  Syringomyelia or syringobulbia. Headaches. History of brain surgery. EXAM: MRI HEAD WITHOUT CONTRAST TECHNIQUE: Multiplanar, multiecho pulse sequences of the brain and surrounding structures were obtained without intravenous contrast. COMPARISON:  Head CT 08/14/2011.  MRI 05/20/2007. FINDINGS: Brain: Distant previous suboccipital craniotomy. Mega cisterna magna is unchanged since the study of 2008. No mass effect. The obex is normal. No focal abnormality otherwise affecting the brainstem or cerebellum. Cerebral hemispheres are again normal with a single punctate focus of T2 and FLAIR signal in the right temporal lobe, not significant. No mass lesion, hemorrhage, hydrocephalus or extra-axial collection. Vascular: Major vessels at the base of the brain show flow. Skull and upper cervical spine: Negative Sinuses/Orbits: Clear/normal Other: None IMPRESSION: Distant suboccipital craniotomy. Mega cisterna magna, unchanged in appearance compared to the examination of 05/20/2007. Otherwise normal appearance of the brain with exception of a punctate focus of T2 and FLAIR signal in the right temporal lobe, unchanged since 2008, not significant. Electronically Signed   By: Nelson Chimes M.D.   On: 05/08/2022 15:43    Assessment & Plan:   Problem List Items Addressed This Visit     Dyspnea    No change PFTs OK      Elbow pain, chronic, right    Fell in May. R elbow pain - lateral. R/o fx X ray  LOW BACK PAIN    On Oxycodone  Potential benefits of a long term opioids use as well as potential risks (i.e. addiction risk, apnea etc) and complications (i.e. Somnolence, constipation and others) were explained to the patient and were aknowledged.       Migraine, chronic, without aura, intractable (Chronic)    Ubrelvi On verapamil, trazodone Maxalt as needed      Relevant Medications   UBRELVY 100 MG TABS   SYRINGOMYELIA    On Rx         No orders of the defined types were placed in this encounter.     Follow-up: Return in about 3 months (around 09/09/2022) for a follow-up visit.  Walker Kehr, MD

## 2022-06-09 NOTE — Assessment & Plan Note (Signed)
No change PFTs OK

## 2022-06-09 NOTE — Assessment & Plan Note (Signed)
On Rx 

## 2022-06-09 NOTE — Assessment & Plan Note (Addendum)
Ubrelvi On verapamil, trazodone Maxalt as needed Stress w/dtr discussed

## 2022-06-09 NOTE — Assessment & Plan Note (Signed)
[place] in May. R elbow pain - lateral. R/o fx X ray

## 2022-06-11 ENCOUNTER — Encounter: Payer: PPO | Admitting: Podiatry

## 2022-06-11 ENCOUNTER — Other Ambulatory Visit: Payer: Self-pay | Admitting: Internal Medicine

## 2022-06-13 ENCOUNTER — Ambulatory Visit: Payer: PPO | Admitting: Neurology

## 2022-06-19 DIAGNOSIS — M21541 Acquired clubfoot, right foot: Secondary | ICD-10-CM | POA: Diagnosis not present

## 2022-06-19 DIAGNOSIS — M2011 Hallux valgus (acquired), right foot: Secondary | ICD-10-CM | POA: Diagnosis not present

## 2022-06-19 DIAGNOSIS — M2041 Other hammer toe(s) (acquired), right foot: Secondary | ICD-10-CM | POA: Diagnosis not present

## 2022-06-19 DIAGNOSIS — M21611 Bunion of right foot: Secondary | ICD-10-CM | POA: Diagnosis not present

## 2022-06-20 ENCOUNTER — Ambulatory Visit (INDEPENDENT_AMBULATORY_CARE_PROVIDER_SITE_OTHER): Payer: PPO

## 2022-06-20 ENCOUNTER — Telehealth: Payer: Self-pay | Admitting: *Deleted

## 2022-06-20 DIAGNOSIS — Z0000 Encounter for general adult medical examination without abnormal findings: Secondary | ICD-10-CM

## 2022-06-20 NOTE — Patient Instructions (Signed)
Beth Huffman , Thank you for taking time to come for your Medicare Wellness Visit. I appreciate your ongoing commitment to your health goals. Please review the following plan we discussed and let me know if I can assist you in the future.   Screening recommendations/referrals: Cologuard: 09/21/2019; due every 3 years Mammogram: 09/09/2021; due ever year Bone Density: never done Recommended yearly ophthalmology/optometry visit for glaucoma screening and checkup Recommended yearly dental visit for hygiene and checkup  Vaccinations: Influenza vaccine: due Fall 2023 Pneumococcal vaccine: 07/25/2016 Tdap vaccine: 10/02/2015; due every 10 years Shingles vaccine: 09/04/2021, 11/18/2021  Covid-19: 01/04/2020, 01/25/2020, 08/19/2020, 07/15/2021, 12/12/2021  Advanced directives: Yes; Please bring a copy of your health care power of attorney and living will to the office at your convenience.  Conditions/risks identified: Yes  Next appointment: Please schedule your next Medicare Wellness Visit with your Nurse Health Advisor in 1 year by calling [phone].  Preventive Care 40-64 Years, Female Preventive care refers to lifestyle choices and visits with your health care provider that can promote health and wellness. What does preventive care include? A yearly physical exam. This is also called an annual well check. Dental exams once or twice a year. Routine eye exams. Ask your health care provider how often you should have your eyes checked. Personal lifestyle choices, including: Daily care of your teeth and gums. Regular physical activity. Eating a healthy diet. Avoiding tobacco and drug use. Limiting alcohol use. Practicing safe sex. Taking low-dose aspirin daily starting at age 73. Taking vitamin and mineral supplements as recommended by your health care provider. What happens during an annual well check? The services and screenings done by your health care provider during your annual well check  will depend on your age, overall health, lifestyle risk factors, and family history of disease. Counseling  Your health care provider may ask you questions about your: Alcohol use. Tobacco use. Drug use. Emotional well-being. Home and relationship well-being. Sexual activity. Eating habits. Work and work Statistician. Method of birth control. Menstrual cycle. Pregnancy history. Screening  You may have the following tests or measurements: Height, weight, and BMI. Blood pressure. Lipid and cholesterol levels. These may be checked every 5 years, or more frequently if you are over 62 years old. Skin check. Lung cancer screening. You may have this screening every year starting at age 73 if you have a 30-pack-year history of smoking and currently smoke or have quit within the past 15 years. Fecal occult blood test (FOBT) of the stool. You may have this test every year starting at age 19. Flexible sigmoidoscopy or colonoscopy. You may have a sigmoidoscopy every 5 years or a colonoscopy every 10 years starting at age 25. Hepatitis C blood test. Hepatitis B blood test. Sexually transmitted disease (STD) testing. Diabetes screening. This is done by checking your blood sugar (glucose) after you have not eaten for a while (fasting). You may have this done every 1-3 years. Mammogram. This may be done every 1-2 years. Talk to your health care provider about when you should start having regular mammograms. This may depend on whether you have a family history of breast cancer. BRCA-related cancer screening. This may be done if you have a family history of breast, ovarian, tubal, or peritoneal cancers. Pelvic exam and Pap test. This may be done every 3 years starting at age 56. Starting at age 52, this may be done every 5 years if you have a Pap test in combination with an HPV test. Bone density scan. This  is done to screen for osteoporosis. You may have this scan if you are at high risk for  osteoporosis. Discuss your test results, treatment options, and if necessary, the need for more tests with your health care provider. Vaccines  Your health care provider may recommend certain vaccines, such as: Influenza vaccine. This is recommended every year. Tetanus, diphtheria, and acellular pertussis (Tdap, Td) vaccine. You may need a Td booster every 10 years. Zoster vaccine. You may need this after age 18. Pneumococcal 13-valent conjugate (PCV13) vaccine. You may need this if you have certain conditions and were not previously vaccinated. Pneumococcal polysaccharide (PPSV23) vaccine. You may need one or two doses if you smoke cigarettes or if you have certain conditions. Talk to your health care provider about which screenings and vaccines you need and how often you need them. This information is not intended to replace advice given to you by your health care provider. Make sure you discuss any questions you have with your health care provider. Document Released: 11/09/2015 Document Revised: 07/02/2016 Document Reviewed: 08/14/2015 Elsevier Interactive Patient Education  2017 [place] Prevention in the Home Falls can cause injuries. They can happen to people of all ages. There are many things you can do to make your home safe and to help prevent falls. What can I do on the outside of my home? Regularly fix the edges of walkways and driveways and fix any cracks. Remove anything that might make you trip as you walk through a door, such as a raised step or threshold. Trim any bushes or trees on the path to your home. Use bright outdoor lighting. Clear any walking paths of anything that might make someone trip, such as rocks or tools. Regularly check to see if handrails are loose or broken. Make sure that both sides of any steps have handrails. Any raised decks and porches should have guardrails on the edges. Have any leaves, snow, or ice cleared regularly. Use sand or  salt on walking paths during winter. Clean up any spills in your garage right away. This includes oil or grease spills. What can I do in the bathroom? Use night lights. Install grab bars by the toilet and in the tub and shower. Do not use towel bars as grab bars. Use non-skid mats or decals in the tub or shower. If you need to sit down in the shower, use a plastic, non-slip stool. Keep the floor dry. Clean up any water that spills on the floor as soon as it happens. Remove soap buildup in the tub or shower regularly. Attach bath mats securely with double-sided non-slip rug tape. Do not have throw rugs and other things on the floor that can make you trip. What can I do in the bedroom? Use night lights. Make sure that you have a light by your bed that is easy to reach. Do not use any sheets or blankets that are too big for your bed. They should not hang down onto the floor. Have a firm chair that has side arms. You can use this for support while you get dressed. Do not have throw rugs and other things on the floor that can make you trip. What can I do in the kitchen? Clean up any spills right away. Avoid walking on wet floors. Keep items that you use a lot in easy-to-reach places. If you need to reach something above you, use a strong step stool that has a grab bar. Keep electrical cords out  of the way. Do not use floor polish or wax that makes floors slippery. If you must use wax, use non-skid floor wax. Do not have throw rugs and other things on the floor that can make you trip. What can I do with my stairs? Do not leave any items on the stairs. Make sure that there are handrails on both sides of the stairs and use them. Fix handrails that are broken or loose. Make sure that handrails are as long as the stairways. Check any carpeting to make sure that it is firmly attached to the stairs. Fix any carpet that is loose or worn. Avoid having throw rugs at the top or bottom of the stairs. If  you do have throw rugs, attach them to the floor with carpet tape. Make sure that you have a light switch at the top of the stairs and the bottom of the stairs. If you do not have them, ask someone to add them for you. What else can I do to help prevent falls? Wear shoes that: Do not have high heels. Have rubber bottoms. Are comfortable and fit you well. Are closed at the toe. Do not wear sandals. If you use a stepladder: Make sure that it is fully opened. Do not climb a closed stepladder. Make sure that both sides of the stepladder are locked into place. Ask someone to hold it for you, if possible. Clearly mark and make sure that you can see: Any grab bars or handrails. First and last steps. Where the edge of each step is. Use tools that help you move around (mobility aids) if they are needed. These include: Canes. Walkers. Scooters. Crutches. Turn on the lights when you go into a dark area. Replace any light bulbs as soon as they burn out. Set up your furniture so you have a clear path. Avoid moving your furniture around. If any of your floors are uneven, fix them. If there are any pets around you, be aware of where they are. Review your medicines with your doctor. Some medicines can make you feel dizzy. This can increase your chance of falling. Ask your doctor what other things that you can do to help prevent falls. This information is not intended to replace advice given to you by your health care provider. Make sure you discuss any questions you have with your health care provider. Document Released: 08/09/2009 Document Revised: 03/20/2016 Document Reviewed: 11/17/2014 Elsevier Interactive Patient Education  2017 [employer].

## 2022-06-20 NOTE — Progress Notes (Signed)
I connected with Beth Huffman today by telephone and verified that I am speaking with the correct person using two identifiers. Location patient: home Location provider: work Persons participating in the virtual visit: patient, provider.   I discussed the limitations, risks, security and privacy concerns of performing an evaluation and management service by telephone and the availability of in person appointments. I also discussed with the patient that there may be a patient responsible charge related to this service. The patient expressed understanding and verbally consented to this telephonic visit.    Interactive audio and video telecommunications were attempted between this provider and patient, however failed, due to patient having technical difficulties OR patient did not have access to video capability.  We continued and completed visit with audio only.  Some vital signs may be absent or patient reported.   Time Spent with patient on telephone encounter: 30 minutes  Subjective:   Beth Huffman is a 61 y.o. female who presents for an Initial Medicare Annual Wellness Visit.  Review of Systems     Cardiac Risk Factors include: family history of premature cardiovascular disease;hypertension     Objective:    Today's Vitals   06/20/22 1503  PainSc: 8    There is no height or weight on file to calculate BMI.     06/20/2022    3:06 PM 04/24/2020    9:40 AM 11/29/2019   10:21 AM 06/14/2019    8:06 AM 09/07/2018    2:00 PM 08/31/2018   10:04 AM 05/07/2018    6:13 AM  Advanced Directives  Does Patient Have a Medical Advance Directive? Yes Yes Yes Yes Yes Yes Yes  Type of Advance Directive Living will;Healthcare Power of [place];Living will  [place];Living will Healthcare Power of [place];Living will [place];Living will  Does patient want to make changes to medical advance  directive? No - Patient declined   No - Patient declined No - Patient declined No - Patient declined No - Patient declined  Copy of [place] in Chart? No - copy requested   Yes - validated most recent copy scanned in chart (See row information) Yes - validated most recent copy scanned in chart (See row information) No - copy requested No - copy requested    Current Medications (verified) Outpatient Encounter Medications as of 06/20/2022  Medication Sig   albuterol (VENTOLIN HFA) 108 (90 Base) MCG/ACT inhaler Inhale 2 puffs into the lungs every 6 (six) hours as needed for wheezing or shortness of breath.   cholecalciferol (VITAMIN D) 1000 UNITS tablet Take 1,000 Units by mouth daily.   cyclobenzaprine (FLEXERIL) 5 MG tablet TAKE 1 TABLET THREE TIMES DAILY AS NEEDED FOR MUSCLE SPASMS.   Erenumab-aooe (AIMOVIG) 140 MG/ML SOAJ Inject 140 mg into the skin every 28 (twenty-eight) days.   estradiol (ESTRACE) 0.5 MG tablet Take 1 tablet (0.5 mg total) by mouth daily.   famotidine (PEPCID) 40 MG tablet Take 1 tablet (40 mg total) by mouth daily.   ibuprofen (IBU) 600 MG tablet TAKE ONE TABLET EVERY 8 HOURS AS NEEDED FOR HEADACHE   Multiple Vitamin (MULTIVITAMIN PO) Take 1 tablet by mouth daily.   Oxycodone HCl 10 MG TABS Take 1 tablet (10 mg total) by mouth 4 (four) times daily as needed. Please fill on or after 08/19/22   Oxycodone HCl 10 MG TABS Take 1 tablet (10 mg total) by mouth 4 (four) times daily  as needed.   Oxycodone HCl 10 MG TABS Take 1 tablet (10 mg total) by mouth every 6 (six) hours as needed.   pantoprazole (PROTONIX) 40 MG tablet Take 1 tablet (40 mg total) by mouth at bedtime.   polyethylene glycol (MIRALAX / GLYCOLAX) packet Take 8.5 g by mouth every other day.   rizatriptan (MAXALT) 10 MG tablet TAKE 1 TABLET AT ONSET OF MIGRAINE- MAY REPEAT ONCE IN 2 HOURS. LIMIT 2/24 HOURS. AVOID DAILY USE.   senna (SENOKOT) 8.6 MG tablet Take 1 tablet by mouth as needed for  constipation.   Turmeric (QC TUMERIC COMPLEX) 500 MG CAPS Take by mouth.   UBRELVY 100 MG TABS Take by mouth.   verapamil (CALAN-SR) 120 MG CR tablet TAKE ONE TABLET AT BEDTIME   No facility-administered encounter medications on file as of 06/20/2022.    Allergies (verified) Atenolol, Cefuroxime axetil, Codeine sulfate, Fentanyl, Imdur [isosorbide nitrate], Nitrofurantoin, Pregabalin, Rapaflo [silodosin], Topamax [topiramate], Tramadol, Tramadol hcl, and Iodinated contrast media   History: Past Medical History:  Diagnosis Date   Abnormal weight gain    Acute sinusitis 12/05/2008   1/18 8/18   Arachnoid cyst 08/19/2011   Chest tightness or pressure 05/07/2018   Constipation 09/08/2012   Chronic - opioid related 11/17 [place]   Contact dermatitis 03/16/2013   5/14 relapsing - poison ivy    Dyspnea 05/07/2018   Elevated liver enzymes 03/04/2011   Chronic off and on    Female stress incontinence 02/06/2009   Qualifier: Diagnosis of  By: Plotnikov MD, Evie Lacks    FEVER UNSPECIFIED 09/29/2007   Qualifier: Diagnosis of  By: Alain Marion MD, Evie Lacks    GERD 09/29/2007   Chronic  Protonix prn  Potential benefits of a long term PPI use as well as potential risks  and complications were explained to the patient and were aknowledged.     GERD (gastroesophageal reflux disease)    Headache 12/20/2007   Dr Hassell Done Migraines (1 per 2 wks) Topamax 400 mg/d; Imitrex prn; Ibuprofen prn, Zofran prn    Hyperglycemia 08/19/2011   Mild     Hypertension    Increased blood pressure (not hypertension) 08/23/2014   Intractable migraine 06/14/2009   Chronic Ibuprofen, Imitrex, Topamax,  Nortriptyline - intolerant   Knee pain 03/04/2011   R knee 2018 L knee pain - L knee b anserina and L knee is tender w/ROM: L knee pain is worse - patellofemoral syndrome vs other   LBP (low back pain)    Local anesthetic drug adverse reaction 01/21/2022   LOW BACK PAIN 09/29/2007   Chronic, lately (2016) disabling Due to  syringomyelia On Oxycodone  Potential benefits of a long term opioids use as well as potential risks (i.e. addiction risk, apnea etc) and complications (i.e. Somnolence, constipation and others) were explained to the patient and were aknowledged.     LUQ PAIN 05/23/2010   Qualifier: Diagnosis of  By: Plotnikov MD, Evie Lacks    Migraine, chronic, without aura, intractable 10/28/2017   Botox approved diagnosis. 7/19 Worse. Start Emagility inj. Relpax   Migraines    Nausea & vomiting 08/19/2011   Nausea without vomiting 06/14/2009   Due to migraines     NECK PAIN 06/14/2009    Chronic, lately (2016) disabling Due to syringomyelia On Oxycodone  Potential benefits of a long term opioids use as well as potential risks (i.e. addiction risk, apnea etc) and complications (i.e. Somnolence, constipation and others) were explained to the patient and were aknowledged.  NONSPECIFIC ABNORMAL RESULTS LIVR FUNCTION STUDY 09/28/2009   Qualifier: Diagnosis of  By: Alain Marion MD, Evie Lacks    OBSESSIVE-COMPULSIVE DISORDER 05/23/2010   Chronic - compulsive shopping    Onychomycosis 01/30/2016   4/17    Orthostatic hypotension 06/30/2013   9/14 likely Rapaflo induced    OTITIS EXTERNA 05/10/2008   Qualifier: Diagnosis of  By: Alain Marion MD, Evie Lacks    Palpitations 06/21/2014   PARESTHESIA 07/13/2008   Qualifier: Diagnosis of  By: Alain Marion MD, Evie Lacks    Partial thickness burn of left wrist 09/11/2015   Postoperative state 07/24/2016   Pyelonephritis 2008   Rash 07/27/2017   Stress 2009   Syncope, near 06/30/2013   9/14 likely Rapaflo induced 12/14 relapsing off Rapaflo x 7 2/15 no relapsing since on 1/2 dose of Topamax    Syringobulbia ([place])    SYRINGOMYELIA 08/20/2007   Chronic Dr Krista Blue Dr [facility] Mullet [facility] Chronic pain    Syringomyelia ([place])    Tachycardia    Tonsillitis, chronic 02/26/2017   2018 worse   URINARY TRACT INFECTION (UTI) 03/23/2008   Qualifier: Diagnosis of  By: Alain Marion MD, Evie Lacks    UTI (lower  urinary tract infection)    Well adult exam 12/21/2013   We discussed age appropriate health related issues, including available/recomended screening tests and vaccinations. We discussed a need for adhering to healthy diet and exercise. Labs/EKG were reviewed/ordered. All questions were answered.      Past Surgical History:  Procedure Laterality Date   ABDOMINAL HYSTERECTOMY     ANTERIOR AND POSTERIOR REPAIR N/A 07/24/2016   Procedure: Possible ANTERIOR (CYSTOCELE) repair, perineoplasty;  Surgeon: Princess Bruins, MD;  Location: [facility];  Service: Gynecology;  Laterality: N/A;   BLADDER SUSPENSION N/A 07/24/2016   Procedure: TRANSVAGINAL TAPE (TVT) PROCEDURE;  Surgeon: Princess Bruins, MD;  Location: [facility];  Service: Gynecology;  Laterality: N/A;   COLONOSCOPY     CYSTOSCOPY N/A 07/24/2016   Procedure: CYSTOSCOPY;  Surgeon: Princess Bruins, MD;  Location: [facility];  Service: Gynecology;  Laterality: N/A;   intracranial decompression surgery  2006   LEFT HEART CATH AND CORONARY ANGIOGRAPHY N/A 05/07/2018   Procedure: LEFT HEART CATH AND CORONARY ANGIOGRAPHY;  Surgeon: Belva Crome, MD;  Location: [facility];  Service: Cardiovascular;  Laterality: N/A;   PARTIAL KNEE ARTHROPLASTY Left 09/07/2018   Procedure: UNICOMPARTMENTAL LEFT KNEE;  Surgeon: Renette Butters, MD;  Location: [facility];  Service: Orthopedics;  Laterality: Left;  Adductor Block   ROBOTIC ASSISTED LAPAROSCOPIC SACROCOLPOPEXY N/A 07/24/2016   Procedure: ROBOTIC ASSISTED LAPAROSCOPIC SACROCOLPOPEXY WITH PERINEOPLASTY;  Surgeon: Princess Bruins, MD;  Location: [facility];  Service: Gynecology;  Laterality: N/A;   TUBAL LIGATION     Family History  Problem Relation Age of Onset   COPD Mother    Peripheral vascular disease Mother    Hypertension Mother    Heart disease Mother    Hypertension Father    Heart attack Maternal Grandmother    Stroke Maternal Grandfather    Stroke Paternal Grandfather    Breast cancer  Maternal Aunt    Colon cancer Maternal Uncle 60   Anxiety disorder Other    Asthma Granddaughter    Social History   Socioeconomic History   Marital status: Married    Spouse name: Not on file   Number of children: 2   Years of education: College   Highest education level: Some college, no degree  Occupational History   Occupation: small business  owner    Employer: SELF-EMPLOYED  Tobacco Use   Smoking status: Never    Passive exposure: Past   Smokeless tobacco: Never  Vaping Use   Vaping Use: Never used  Substance and Sexual Activity   Alcohol use: No   Drug use: No   Sexual activity: Yes    Partners: Male    Birth control/protection: Surgical    Comment: btl, hysterectomy  Other Topics Concern   Not on file  Social History Narrative   Lives at home with her husband.   Left handed   1 cup caffeine per day.   Social Determinants of Health   Financial Resource Strain: Low Risk  (06/20/2022)   Overall Financial Resource Strain (CARDIA)    Difficulty of Paying Living Expenses: Not hard at all  Food Insecurity: No Food Insecurity (06/20/2022)   Hunger Vital Sign    Worried About Running Out of Food in the Last Year: Never true    Ran Out of Food in the Last Year: Never true  Transportation Needs: No Transportation Needs (06/20/2022)   PRAPARE - Hydrologist (Medical): No    Lack of Transportation (Non-Medical): No  Physical Activity: Sufficiently Active (06/20/2022)   Exercise Vital Sign    Days of Exercise per Week: 5 days    Minutes of Exercise per Session: 30 min  Stress: No Stress Concern Present (06/20/2022)   [place]    Feeling of Stress : Not at all  Social Connections: [place] (06/20/2022)   Social Connection and Isolation Panel [NHANES]    Frequency of Communication with Friends and Family: More than three times a week    Frequency of Social Gatherings  with Friends and Family: More than three times a week    Attends Religious Services: More than 4 times per year    Active Member of [employer] or Organizations: Yes    Attends Music therapist: More than 4 times per year    Marital Status: Married    Tobacco Counseling Counseling given: Not Answered   Clinical Intake:  Pre-visit preparation completed: Yes  Pain : 0-10 Pain Score: 8  Pain Location: Foot Pain Orientation: Right Pain Relieving Factors: Oxycodone  Pain Relieving Factors: Oxycodone  Nutritional Risks: None Diabetes: No  How often do you need to have someone help you when you read instructions, pamphlets, or other written materials from your doctor or pharmacy?: 1 - Never What is the last grade level you completed in school?: HSG; some college  Diabetic? no  Interpreter Needed?: No  Information entered by :: Lisette Abu, LPN.   Activities of Daily Living    06/20/2022    3:14 PM  In your present state of health, do you have any difficulty performing the following activities:  Hearing? 0  Vision? 0  Difficulty concentrating or making decisions? 0  Walking or climbing stairs? 0  Dressing or bathing? 0  Doing errands, shopping? 0  Preparing Food and eating ? N  Using the Toilet? N  In the past six months, have you accidently leaked urine? Y  Do you have problems with loss of bowel control? N  Managing your Medications? N  Managing your Finances? N  Housekeeping or managing your Housekeeping? N    Patient Care Team: Plotnikov, Evie Lacks, MD as PCP - General Princess Bruins, MD as Consulting Physician (Obstetrics and Gynecology) Pieter Partridge, DO as Consulting Physician (Neurology)  Deboraha Sprang, MD as Consulting Physician (Cardiology) Edrick Kins, DPM as Consulting Physician (Podiatry) Derrill Memo, MD as Referring Physician (Neurosurgery) Spero Geralds, MD as Consulting Physician (Pulmonary Disease)  Indicate any recent  Medical Services you may have received from other than [facility] providers in the past year (date may be approximate).     Assessment:   This is a routine wellness examination for Beth Huffman.  Hearing/Vision screen Hearing Screening - Comments:: Patient denied any hearing difficulty.   No hearing aids.  Vision Screening - Comments:: Patient does wear readers.  Eye exam done by: [place] issues and exercise activities discussed: Current Exercise Habits: Home exercise routine, Type of exercise: walking, Time (Minutes): 30, Frequency (Times/Week): 5, Weekly Exercise (Minutes/Week): 150, Intensity: Moderate, Exercise limited by: orthopedic condition(s)   Goals Addressed   None   Depression Screen    06/20/2022    3:11 PM 07/31/2021    8:18 AM 01/24/2021    9:23 AM 05/07/2020    2:32 PM 08/03/2018   10:02 AM 07/27/2017    8:58 AM  PHQ 2/9 Scores  PHQ - 2 Score 0 0 0 0 0 0  PHQ- 9 Score  2        Fall Risk    06/20/2022    3:08 PM 07/31/2021    8:18 AM 04/24/2020    9:40 AM 11/29/2019   10:21 AM 06/14/2019    8:06 AM  Fall Risk   Falls in the past year? 1 0 1 1 0  Number falls in past yr: 0 0 0 0   Injury with Fall? 1 0 0 0   Risk for fall due to :  No Fall Risks     Follow up Falls evaluation completed    Falls evaluation completed    [place]:  Any stairs in or around the home? No  If so, are there any without handrails? No  Home free of loose throw rugs in walkways, pet beds, electrical cords, etc? Yes  Adequate lighting in your home to reduce risk of falls? Yes   ASSISTIVE DEVICES UTILIZED TO PREVENT FALLS:  Life alert? No  Use of a cane, walker or w/c? No  Grab bars in the bathroom? Yes  Shower chair or bench in shower? Yes  Elevated toilet seat or a handicapped toilet? Yes   TIMED UP AND GO:  Was the test performed? No .  Length of time to ambulate 10 feet: n/a sec.   Appearance of gait: Gait not evaluated during this  visit.  Cognitive Function:        06/20/2022    3:14 PM  6CIT Screen  What Year? 0 points  What month? 0 points  What time? 0 points  Count back from 20 0 points  Months in reverse 0 points  Repeat phrase 0 points  Total Score 0 points    Immunizations Immunization History  Administered Date(s) Administered   Influenza, High Dose Seasonal PF 12/12/2021   Influenza,inj,Quad PF,6+ Mos 09/14/2013, 06/27/2015, 07/25/2016, 07/27/2017, 08/03/2018, 07/29/2019, 07/31/2020, 07/31/2021   Influenza-Unspecified 09/13/2014   Moderna Sars-Covid-2 Vaccination 01/04/2020, 01/25/2020   PFIZER(Purple Top)SARS-COV-2 Vaccination 08/19/2020, 12/12/2021   Pfizer Covid-19 Vaccine Bivalent Booster 48yr & up 07/15/2021   Pneumococcal Polysaccharide-23 07/25/2016   Tdap 09/28/2015   Zoster Recombinat (Shingrix) 09/04/2021, 11/18/2021    TDAP status: Up to date  Flu Vaccine status: Up to date  Pneumococcal vaccine status: Up  to date  Covid-19 vaccine status: Completed vaccines  Qualifies for Shingles Vaccine? Yes   Zostavax completed No   Shingrix Completed?: Yes  Screening Tests Health Maintenance  Topic Date Due   HIV Screening  Never done   Hepatitis C Screening  Never done   COVID-19 Vaccine (6 - Mixed Product risk series) 02/06/2022   INFLUENZA VACCINE  05/27/2022   Fecal DNA (Cologuard)  09/20/2022   MAMMOGRAM  09/10/2023   TETANUS/TDAP  09/27/2025   Zoster Vaccines- Shingrix  Completed   HPV VACCINES  Aged Out   COLONOSCOPY (Pts 45-49yr Insurance coverage will need to be confirmed)  Discontinued    Health Maintenance  Health Maintenance Due  Topic Date Due   HIV Screening  Never done   Hepatitis C Screening  Never done   COVID-19 Vaccine (6 - Mixed Product risk series) 02/06/2022   INFLUENZA VACCINE  05/27/2022    Colorectal cancer screening: Type of screening: Cologuard. Completed 09/21/2019. Repeat every 3 years  Mammogram status: Completed 09/09/2021. Repeat  every year  Bone Density status: never done  Lung Cancer Screening: (Low Dose CT Chest recommended if Age 62-80years, 30 pack-year currently smoking OR have quit w/in 15years.) does not qualify.   Lung Cancer Screening Referral: no  Additional Screening:  Hepatitis C Screening: does not qualify; Completed no  Vision Screening: Recommended annual ophthalmology exams for early detection of glaucoma and other disorders of the eye. Is the patient up to date with their annual eye exam?  Yes  Who is the provider or what is the name of the office in which the patient attends annual eye exams? CWarden Fillers MD. If pt is not established with a provider, would they like to be referred to a provider to establish care? No .   Dental Screening: Recommended annual dental exams for proper oral hygiene  Community Resource Referral / Chronic Care Management: CRR required this visit?  No   CCM required this visit?  No      Plan:     I have personally reviewed and noted the following in the patient's chart:   Medical and social history Use of alcohol, tobacco or illicit drugs  Current medications and supplements including opioid prescriptions. Patient is currently taking opioid prescriptions. Information provided to patient regarding non-opioid alternatives. Patient advised to discuss non-opioid treatment plan with their provider. Functional ability and status Nutritional status Physical activity Advanced directives List of other physicians Hospitalizations, surgeries, and ER visits in previous 12 months Vitals Screenings to include cognitive, depression, and falls Referrals and appointments  In addition, I have reviewed and discussed with patient certain preventive protocols, quality metrics, and best practice recommendations. A written personalized care plan for preventive services as well as general preventive health recommendations were provided to patient.     SSheral Flow LPN   [id number]/40/1027  Nurse Notes:  Patient is cogitatively intact. There were no vitals filed for this visit. There is no height or weight on file to calculate BMI.

## 2022-06-20 NOTE — Telephone Encounter (Signed)
Patient is calling because she didn't know if an antibiotic was supposed to be prescribed after her surgery,please advise.

## 2022-06-23 NOTE — Telephone Encounter (Signed)
Patient notified thru voice message

## 2022-06-25 ENCOUNTER — Ambulatory Visit (INDEPENDENT_AMBULATORY_CARE_PROVIDER_SITE_OTHER): Payer: PPO | Admitting: Podiatry

## 2022-06-25 ENCOUNTER — Ambulatory Visit (INDEPENDENT_AMBULATORY_CARE_PROVIDER_SITE_OTHER): Payer: PPO

## 2022-06-25 DIAGNOSIS — Z9889 Other specified postprocedural states: Secondary | ICD-10-CM

## 2022-06-25 DIAGNOSIS — M2041 Other hammer toe(s) (acquired), right foot: Secondary | ICD-10-CM | POA: Diagnosis not present

## 2022-06-25 NOTE — Progress Notes (Signed)
No chief complaint on file.   Subjective:  Patient presents today status post bunionectomy with Weil shortening osteotomies of the second and third digits right. DOS: 06/18/2022.  Patient states that she is doing very well.  Minimal pain.  She has been ambulating in the cam boot as instructed.  She does admit to doing excessive ambulation and walking.  Presenting for further treatment and evaluation  Past Medical History:  Diagnosis Date   Abnormal weight gain    Acute sinusitis 12/05/2008   1/18 8/18   Arachnoid cyst 08/19/2011   Chest tightness or pressure 05/07/2018   Constipation 09/08/2012   Chronic - opioid related 11/17 Amitiza   Contact dermatitis 03/16/2013   5/14 relapsing - poison ivy    Dyspnea 05/07/2018   Elevated liver enzymes 03/04/2011   Chronic off and on    Female stress incontinence 02/06/2009   Qualifier: Diagnosis of  By: Plotnikov MD, Evie Lacks    FEVER UNSPECIFIED 09/29/2007   Qualifier: Diagnosis of  By: Alain Marion MD, Evie Lacks    GERD 09/29/2007   Chronic  Protonix prn  Potential benefits of a long term PPI use as well as potential risks  and complications were explained to the patient and were aknowledged.     GERD (gastroesophageal reflux disease)    Headache 12/20/2007   Dr Hassell Done Migraines (1 per 2 wks) Topamax 400 mg/d; Imitrex prn; Ibuprofen prn, Zofran prn    Hyperglycemia 08/19/2011   Mild     Hypertension    Increased blood pressure (not hypertension) 08/23/2014   Intractable migraine 06/14/2009   Chronic Ibuprofen, Imitrex, Topamax,  Nortriptyline - intolerant   Knee pain 03/04/2011   R knee 2018 L knee pain - L knee b anserina and L knee is tender w/ROM: L knee pain is worse - patellofemoral syndrome vs other   LBP (low back pain)    Local anesthetic drug adverse reaction 01/21/2022   LOW BACK PAIN 09/29/2007   Chronic, lately (2016) disabling Due to syringomyelia On Oxycodone  Potential benefits of a long term opioids use as well as potential risks  (i.e. addiction risk, apnea etc) and complications (i.e. Somnolence, constipation and others) were explained to the patient and were aknowledged.     LUQ PAIN 05/23/2010   Qualifier: Diagnosis of  By: Plotnikov MD, Evie Lacks    Migraine, chronic, without aura, intractable 10/28/2017   Botox approved diagnosis. 7/19 Worse. Start Emagility inj. Relpax   Migraines    Nausea & vomiting 08/19/2011   Nausea without vomiting 06/14/2009   Due to migraines     NECK PAIN 06/14/2009    Chronic, lately (2016) disabling Due to syringomyelia On Oxycodone  Potential benefits of a long term opioids use as well as potential risks (i.e. addiction risk, apnea etc) and complications (i.e. Somnolence, constipation and others) were explained to the patient and were aknowledged.     NONSPECIFIC ABNORMAL RESULTS LIVR FUNCTION STUDY 09/28/2009   Qualifier: Diagnosis of  By: Alain Marion MD, Evie Lacks    OBSESSIVE-COMPULSIVE DISORDER 05/23/2010   Chronic - compulsive shopping    Onychomycosis 01/30/2016   4/17    Orthostatic hypotension 06/30/2013   9/14 likely Rapaflo induced    OTITIS EXTERNA 05/10/2008   Qualifier: Diagnosis of  By: Alain Marion MD, Evie Lacks    Palpitations 06/21/2014   PARESTHESIA 07/13/2008   Qualifier: Diagnosis of  By: Alain Marion MD, Evie Lacks    Partial thickness burn of left wrist 09/11/2015   Postoperative  state 07/24/2016   Pyelonephritis 2008   Rash 07/27/2017   Stress 2009   Syncope, near 06/30/2013   9/14 likely Rapaflo induced 12/14 relapsing off Rapaflo x 7 2/15 no relapsing since on 1/2 dose of Topamax    Syringobulbia ([place])    SYRINGOMYELIA 08/20/2007   Chronic Dr Krista Blue Dr Jalene Mullet at [facility] Chronic pain    Syringomyelia ([place])    Tachycardia    Tonsillitis, chronic 02/26/2017   2018 worse   URINARY TRACT INFECTION (UTI) 03/23/2008   Qualifier: Diagnosis of  By: Alain Marion MD, Evie Lacks    UTI (lower urinary tract infection)    Well adult exam 12/21/2013   We discussed age appropriate health related  issues, including available/recomended screening tests and vaccinations. We discussed a need for adhering to healthy diet and exercise. Labs/EKG were reviewed/ordered. All questions were answered.       Past Surgical History:  Procedure Laterality Date   ABDOMINAL HYSTERECTOMY     ANTERIOR AND POSTERIOR REPAIR N/A 07/24/2016   Procedure: Possible ANTERIOR (CYSTOCELE) repair, perineoplasty;  Surgeon: Princess Bruins, MD;  Location: [facility];  Service: Gynecology;  Laterality: N/A;   BLADDER SUSPENSION N/A 07/24/2016   Procedure: TRANSVAGINAL TAPE (TVT) PROCEDURE;  Surgeon: Princess Bruins, MD;  Location: [facility];  Service: Gynecology;  Laterality: N/A;   COLONOSCOPY     CYSTOSCOPY N/A 07/24/2016   Procedure: CYSTOSCOPY;  Surgeon: Princess Bruins, MD;  Location: [facility];  Service: Gynecology;  Laterality: N/A;   intracranial decompression surgery  2006   LEFT HEART CATH AND CORONARY ANGIOGRAPHY N/A 05/07/2018   Procedure: LEFT HEART CATH AND CORONARY ANGIOGRAPHY;  Surgeon: Belva Crome, MD;  Location: [facility];  Service: Cardiovascular;  Laterality: N/A;   PARTIAL KNEE ARTHROPLASTY Left 09/07/2018   Procedure: UNICOMPARTMENTAL LEFT KNEE;  Surgeon: Renette Butters, MD;  Location: [facility];  Service: Orthopedics;  Laterality: Left;  Adductor Block   ROBOTIC ASSISTED LAPAROSCOPIC SACROCOLPOPEXY N/A 07/24/2016   Procedure: ROBOTIC ASSISTED LAPAROSCOPIC SACROCOLPOPEXY WITH PERINEOPLASTY;  Surgeon: Princess Bruins, MD;  Location: [facility];  Service: Gynecology;  Laterality: N/A;   TUBAL LIGATION      Allergies  Allergen Reactions   Atenolol Other (See Comments)    Wt gain   Cefuroxime Axetil Nausea Only and Other (See Comments)    REACTION: nausea - but tolerates PCN   Codeine Sulfate Nausea And Vomiting and Nausea Only   Fentanyl Nausea And Vomiting and Other (See Comments)    REACTION: bad reaction   Imdur [Isosorbide Nitrate] Other (See Comments)    Headaches   Nitrofurantoin  Other (See Comments)    Unknown   Pregabalin Other (See Comments)    REACTION: cp   Rapaflo [Silodosin] Other (See Comments)    Orthostatic BP drop, near syncope    Topamax [Topiramate]     Tight chest    Tramadol Other (See Comments)   Tramadol Hcl Nausea And Vomiting    REACTION: sick   Iodinated Contrast Media Rash    Objective/Physical Exam Neurovascular status intact.  Skin incisions appear to be well coapted with sutures intact. No sign of infectious process noted. No dehiscence. No active bleeding noted. Moderate edema noted to the surgical extremity.  Radiographic Exam:  Orthopedic hardware and osteotomies sites appear to be stable with routine healing.  There does appear to be some slight translocation of the screws of the first metatarsal but overall good alignment of the first ray  Assessment: 1. s/p bunionectomy with  first metatarsal osteotomy and Weil shortening osteotomies of the second third digits with PIPJ arthroplasty second digit right. DOS: 06/18/2022   Plan of Care:  1. Patient was evaluated. X-rays reviewed 2.  Dressings changed.  Clean dry and intact x1 week 3.  Continue minimal weightbearing in the cam boot.  Stressed the importance of reducing activity 4.  Return to clinic 1 week   Edrick Kins, DPM [facility]  Dr. Edrick Kins, DPM    [address], [place] [address]                Office [phone]  Fax [phone]

## 2022-06-26 ENCOUNTER — Encounter: Payer: Self-pay | Admitting: Podiatry

## 2022-06-27 ENCOUNTER — Telehealth: Payer: Self-pay

## 2022-06-27 NOTE — Telephone Encounter (Signed)
Just sent patient a message regarding this. - Dr. Amalia Hailey

## 2022-07-02 ENCOUNTER — Ambulatory Visit (INDEPENDENT_AMBULATORY_CARE_PROVIDER_SITE_OTHER): Payer: PPO

## 2022-07-02 ENCOUNTER — Ambulatory Visit (INDEPENDENT_AMBULATORY_CARE_PROVIDER_SITE_OTHER): Payer: PPO | Admitting: Podiatry

## 2022-07-02 DIAGNOSIS — Z9889 Other specified postprocedural states: Secondary | ICD-10-CM | POA: Diagnosis not present

## 2022-07-02 NOTE — Progress Notes (Signed)
Chief Complaint  Patient presents with   Routine Post Op    POV #2 DOS 06/19/2022 BUNIONECTOMY W/OSTEOTOMY RT, HAMMERTOE ARTHROPLASTY 2ND RT, WEIL SHORTENING OSTEOTOMY 2ND, 3RD RT    Subjective:  Patient presents today status post bunionectomy with Weil shortening osteotomies of the second and third digits right. DOS: 06/18/2022.  Patient states that she is doing very well.  Patient states that she has reduced her activity significantly over the past week.  They also purchased a knee scooter and she has been mostly nonweightbearing.  No new complaints at this time  Past Medical History:  Diagnosis Date   Abnormal weight gain    Acute sinusitis 12/05/2008   1/18 8/18   Arachnoid cyst 08/19/2011   Chest tightness or pressure 05/07/2018   Constipation 09/08/2012   Chronic - opioid related 11/17 Amitiza   Contact dermatitis 03/16/2013   5/14 relapsing - poison ivy    Dyspnea 05/07/2018   Elevated liver enzymes 03/04/2011   Chronic off and on    Female stress incontinence 02/06/2009   Qualifier: Diagnosis of  By: Alain Marion MD, Evie Lacks    FEVER UNSPECIFIED 09/29/2007   Qualifier: Diagnosis of  By: Alain Marion MD, Evie Lacks    GERD 09/29/2007   Chronic  Protonix prn  Potential benefits of a long term PPI use as well as potential risks  and complications were explained to the patient and were aknowledged.     GERD (gastroesophageal reflux disease)    Headache 12/20/2007   Dr Hassell Done Migraines (1 per 2 wks) Topamax 400 mg/d; Imitrex prn; Ibuprofen prn, Zofran prn    Hyperglycemia 08/19/2011   Mild     Hypertension    Increased blood pressure (not hypertension) 08/23/2014   Intractable migraine 06/14/2009   Chronic Ibuprofen, Imitrex, Topamax,  Nortriptyline - intolerant   Knee pain 03/04/2011   R knee 2018 L knee pain - L knee b anserina and L knee is tender w/ROM: L knee pain is worse - patellofemoral syndrome vs other   LBP (low back pain)    Local anesthetic drug adverse reaction 01/21/2022    LOW BACK PAIN 09/29/2007   Chronic, lately (2016) disabling Due to syringomyelia On Oxycodone  Potential benefits of a long term opioids use as well as potential risks (i.e. addiction risk, apnea etc) and complications (i.e. Somnolence, constipation and others) were explained to the patient and were aknowledged.     LUQ PAIN 05/23/2010   Qualifier: Diagnosis of  By: Plotnikov MD, Evie Lacks    Migraine, chronic, without aura, intractable 10/28/2017   Botox approved diagnosis. 7/19 Worse. Start Emagility inj. Relpax   Migraines    Nausea & vomiting 08/19/2011   Nausea without vomiting 06/14/2009   Due to migraines     NECK PAIN 06/14/2009    Chronic, lately (2016) disabling Due to syringomyelia On Oxycodone  Potential benefits of a long term opioids use as well as potential risks (i.e. addiction risk, apnea etc) and complications (i.e. Somnolence, constipation and others) were explained to the patient and were aknowledged.     NONSPECIFIC ABNORMAL RESULTS LIVR FUNCTION STUDY 09/28/2009   Qualifier: Diagnosis of  By: Alain Marion MD, Evie Lacks    OBSESSIVE-COMPULSIVE DISORDER 05/23/2010   Chronic - compulsive shopping    Onychomycosis 01/30/2016   4/17    Orthostatic hypotension 06/30/2013   9/14 likely Rapaflo induced    OTITIS EXTERNA 05/10/2008   Qualifier: Diagnosis of  By: Alain Marion MD, Evie Lacks  Palpitations 06/21/2014   PARESTHESIA 07/13/2008   Qualifier: Diagnosis of  By: Plotnikov MD, Evie Lacks    Partial thickness burn of left wrist 09/11/2015   Postoperative state 07/24/2016   Pyelonephritis 2008   Rash 07/27/2017   Stress 2009   Syncope, near 06/30/2013   9/14 likely Rapaflo induced 12/14 relapsing off Rapaflo x 7 2/15 no relapsing since on 1/2 dose of Topamax    Syringobulbia ([place])    SYRINGOMYELIA 08/20/2007   Chronic Dr Krista Blue Dr Jalene Mullet at [facility] Chronic pain    Syringomyelia ([place])    Tachycardia    Tonsillitis, chronic 02/26/2017   2018 worse   URINARY TRACT INFECTION (UTI) 03/23/2008    Qualifier: Diagnosis of  By: Alain Marion MD, Evie Lacks    UTI (lower urinary tract infection)    Well adult exam 12/21/2013   We discussed age appropriate health related issues, including available/recomended screening tests and vaccinations. We discussed a need for adhering to healthy diet and exercise. Labs/EKG were reviewed/ordered. All questions were answered.       Past Surgical History:  Procedure Laterality Date   ABDOMINAL HYSTERECTOMY     ANTERIOR AND POSTERIOR REPAIR N/A 07/24/2016   Procedure: Possible ANTERIOR (CYSTOCELE) repair, perineoplasty;  Surgeon: Princess Bruins, MD;  Location: [facility];  Service: Gynecology;  Laterality: N/A;   BLADDER SUSPENSION N/A 07/24/2016   Procedure: TRANSVAGINAL TAPE (TVT) PROCEDURE;  Surgeon: Princess Bruins, MD;  Location: [facility];  Service: Gynecology;  Laterality: N/A;   COLONOSCOPY     CYSTOSCOPY N/A 07/24/2016   Procedure: CYSTOSCOPY;  Surgeon: Princess Bruins, MD;  Location: [facility];  Service: Gynecology;  Laterality: N/A;   intracranial decompression surgery  2006   LEFT HEART CATH AND CORONARY ANGIOGRAPHY N/A 05/07/2018   Procedure: LEFT HEART CATH AND CORONARY ANGIOGRAPHY;  Surgeon: Belva Crome, MD;  Location: [facility];  Service: Cardiovascular;  Laterality: N/A;   PARTIAL KNEE ARTHROPLASTY Left 09/07/2018   Procedure: UNICOMPARTMENTAL LEFT KNEE;  Surgeon: Renette Butters, MD;  Location: [facility];  Service: Orthopedics;  Laterality: Left;  Adductor Block   ROBOTIC ASSISTED LAPAROSCOPIC SACROCOLPOPEXY N/A 07/24/2016   Procedure: ROBOTIC ASSISTED LAPAROSCOPIC SACROCOLPOPEXY WITH PERINEOPLASTY;  Surgeon: Princess Bruins, MD;  Location: [facility];  Service: Gynecology;  Laterality: N/A;   TUBAL LIGATION      Allergies  Allergen Reactions   Atenolol Other (See Comments)    Wt gain   Cefuroxime Axetil Nausea Only and Other (See Comments)    REACTION: nausea - but tolerates PCN   Codeine Sulfate Nausea And Vomiting and Nausea Only    Fentanyl Nausea And Vomiting and Other (See Comments)    REACTION: bad reaction   Imdur [Isosorbide Nitrate] Other (See Comments)    Headaches   Nitrofurantoin Other (See Comments)    Unknown   Pregabalin Other (See Comments)    REACTION: cp   Rapaflo [Silodosin] Other (See Comments)    Orthostatic BP drop, near syncope    Topamax [Topiramate]     Tight chest    Tramadol Other (See Comments)   Tramadol Hcl Nausea And Vomiting    REACTION: sick   Iodinated Contrast Media Rash    Objective/Physical Exam Neurovascular status intact.  Skin incisions appear to be well coapted with sutures intact. No sign of infectious process noted. No dehiscence. No active bleeding noted.  Edema appears to be improved.  Minimal edema noted  Radiographic Exam 07/02/2022 RT foot:  No significant change since last week.  Orthopedic hardware and osteotomies sites appear to be stable with routine healing.  translocation of the screws of the first metatarsal but overall good alignment of the first ray  Assessment: 1. s/p bunionectomy with first metatarsal osteotomy and Weil shortening osteotomies of the second third digits with PIPJ arthroplasty second digit right. DOS: 06/18/2022   Plan of Care:  1. Patient was evaluated. X-rays reviewed 2.  Sutures removed.  Patient may begin washing and showering and getting the foot wet 3.  Continue minimal weightbearing in the cam boot 4.  Ace wrap is provided.  Continue compression with Ace wrap's daily 5.  Return to clinic 3 weeks for percutaneous pin removal and follow-up x-ray   Edrick Kins, DPM [facility]  Dr. Edrick Kins, DPM    [address], [place] [address]                Office [phone]  Fax [phone]

## 2022-07-07 ENCOUNTER — Encounter: Payer: Self-pay | Admitting: Podiatry

## 2022-07-09 ENCOUNTER — Telehealth: Payer: Self-pay

## 2022-07-09 NOTE — Telephone Encounter (Signed)
This message has been routed to the appropriate staff.

## 2022-07-09 NOTE — Telephone Encounter (Signed)
Spoke with patient on the phone.  We are going to work her in on 07/11/2022 this Friday before she leaves out of town.  Thanks, Dr. Amalia Hailey

## 2022-07-10 NOTE — Telephone Encounter (Signed)
Noted, thank 

## 2022-07-11 ENCOUNTER — Ambulatory Visit (INDEPENDENT_AMBULATORY_CARE_PROVIDER_SITE_OTHER): Payer: PPO

## 2022-07-11 ENCOUNTER — Ambulatory Visit: Payer: PPO | Admitting: Podiatry

## 2022-07-11 DIAGNOSIS — Z9889 Other specified postprocedural states: Secondary | ICD-10-CM

## 2022-07-11 DIAGNOSIS — M2042 Other hammer toe(s) (acquired), left foot: Secondary | ICD-10-CM

## 2022-07-11 DIAGNOSIS — M2041 Other hammer toe(s) (acquired), right foot: Secondary | ICD-10-CM

## 2022-07-11 MED ORDER — GABAPENTIN 100 MG PO CAPS: 100.0000 mg | ORAL_CAPSULE | Freq: Three times a day (TID) | ORAL | 3 refills | Status: DC

## 2022-07-11 MED ORDER — KETOROLAC TROMETHAMINE 10 MG PO TABS: 10.0000 mg | ORAL_TABLET | Freq: Four times a day (QID) | ORAL | 0 refills | Status: DC | PRN

## 2022-07-11 NOTE — Progress Notes (Signed)
Chief Complaint  Patient presents with   Routine Post Op    Subjective:  Patient presents today status post bunionectomy with Weil shortening osteotomies of the second and third digits right. DOS: 06/18/2022.  Patient states that she is doing very well.  She is doing well.  She is going away on a trip and just wanted to make sure everything is healing well.  Past Medical History:  Diagnosis Date   Abnormal weight gain    Acute sinusitis 12/05/2008   1/18 8/18   Arachnoid cyst 08/19/2011   Chest tightness or pressure 05/07/2018   Constipation 09/08/2012   Chronic - opioid related 11/17 Amitiza   Contact dermatitis 03/16/2013   5/14 relapsing - poison ivy    Dyspnea 05/07/2018   Elevated liver enzymes 03/04/2011   Chronic off and on    Female stress incontinence 02/06/2009   Qualifier: Diagnosis of  By: Alain Marion MD, Evie Lacks    FEVER UNSPECIFIED 09/29/2007   Qualifier: Diagnosis of  By: Alain Marion MD, Evie Lacks    GERD 09/29/2007   Chronic  Protonix prn  Potential benefits of a long term PPI use as well as potential risks  and complications were explained to the patient and were aknowledged.     GERD (gastroesophageal reflux disease)    Headache 12/20/2007   Dr Hassell Done Migraines (1 per 2 wks) Topamax 400 mg/d; Imitrex prn; Ibuprofen prn, Zofran prn    Hyperglycemia 08/19/2011   Mild     Hypertension    Increased blood pressure (not hypertension) 08/23/2014   Intractable migraine 06/14/2009   Chronic Ibuprofen, Imitrex, Topamax,  Nortriptyline - intolerant   Knee pain 03/04/2011   R knee 2018 L knee pain - L knee b anserina and L knee is tender w/ROM: L knee pain is worse - patellofemoral syndrome vs other   LBP (low back pain)    Local anesthetic drug adverse reaction 01/21/2022   LOW BACK PAIN 09/29/2007   Chronic, lately (2016) disabling Due to syringomyelia On Oxycodone  Potential benefits of a long term opioids use as well as potential risks (i.e. addiction risk, apnea etc) and  complications (i.e. Somnolence, constipation and others) were explained to the patient and were aknowledged.     LUQ PAIN 05/23/2010   Qualifier: Diagnosis of  By: Plotnikov MD, Evie Lacks    Migraine, chronic, without aura, intractable 10/28/2017   Botox approved diagnosis. 7/19 Worse. Start Emagility inj. Relpax   Migraines    Nausea & vomiting 08/19/2011   Nausea without vomiting 06/14/2009   Due to migraines     NECK PAIN 06/14/2009    Chronic, lately (2016) disabling Due to syringomyelia On Oxycodone  Potential benefits of a long term opioids use as well as potential risks (i.e. addiction risk, apnea etc) and complications (i.e. Somnolence, constipation and others) were explained to the patient and were aknowledged.     NONSPECIFIC ABNORMAL RESULTS LIVR FUNCTION STUDY 09/28/2009   Qualifier: Diagnosis of  By: Alain Marion MD, Evie Lacks    OBSESSIVE-COMPULSIVE DISORDER 05/23/2010   Chronic - compulsive shopping    Onychomycosis 01/30/2016   4/17    Orthostatic hypotension 06/30/2013   9/14 likely Rapaflo induced    OTITIS EXTERNA 05/10/2008   Qualifier: Diagnosis of  By: Alain Marion MD, Evie Lacks    Palpitations 06/21/2014   PARESTHESIA 07/13/2008   Qualifier: Diagnosis of  By: Alain Marion MD, Evie Lacks    Partial thickness burn of left wrist 09/11/2015   Postoperative state  07/24/2016   Pyelonephritis 2008   Rash 07/27/2017   Stress 2009   Syncope, near 06/30/2013   9/14 likely Rapaflo induced 12/14 relapsing off Rapaflo x 7 2/15 no relapsing since on 1/2 dose of Topamax    Syringobulbia ([place])    SYRINGOMYELIA 08/20/2007   Chronic Dr Krista Blue Dr Jalene Mullet at [facility] Chronic pain    Syringomyelia ([place])    Tachycardia    Tonsillitis, chronic 02/26/2017   2018 worse   URINARY TRACT INFECTION (UTI) 03/23/2008   Qualifier: Diagnosis of  By: Alain Marion MD, Evie Lacks    UTI (lower urinary tract infection)    Well adult exam 12/21/2013   We discussed age appropriate health related issues, including  available/recomended screening tests and vaccinations. We discussed a need for adhering to healthy diet and exercise. Labs/EKG were reviewed/ordered. All questions were answered.       Past Surgical History:  Procedure Laterality Date   ABDOMINAL HYSTERECTOMY     ANTERIOR AND POSTERIOR REPAIR N/A 07/24/2016   Procedure: Possible ANTERIOR (CYSTOCELE) repair, perineoplasty;  Surgeon: Princess Bruins, MD;  Location: [facility];  Service: Gynecology;  Laterality: N/A;   BLADDER SUSPENSION N/A 07/24/2016   Procedure: TRANSVAGINAL TAPE (TVT) PROCEDURE;  Surgeon: Princess Bruins, MD;  Location: [facility];  Service: Gynecology;  Laterality: N/A;   COLONOSCOPY     CYSTOSCOPY N/A 07/24/2016   Procedure: CYSTOSCOPY;  Surgeon: Princess Bruins, MD;  Location: [facility];  Service: Gynecology;  Laterality: N/A;   intracranial decompression surgery  2006   LEFT HEART CATH AND CORONARY ANGIOGRAPHY N/A 05/07/2018   Procedure: LEFT HEART CATH AND CORONARY ANGIOGRAPHY;  Surgeon: Belva Crome, MD;  Location: [facility];  Service: Cardiovascular;  Laterality: N/A;   PARTIAL KNEE ARTHROPLASTY Left 09/07/2018   Procedure: UNICOMPARTMENTAL LEFT KNEE;  Surgeon: Renette Butters, MD;  Location: [facility];  Service: Orthopedics;  Laterality: Left;  Adductor Block   ROBOTIC ASSISTED LAPAROSCOPIC SACROCOLPOPEXY N/A 07/24/2016   Procedure: ROBOTIC ASSISTED LAPAROSCOPIC SACROCOLPOPEXY WITH PERINEOPLASTY;  Surgeon: Princess Bruins, MD;  Location: [facility];  Service: Gynecology;  Laterality: N/A;   TUBAL LIGATION      Allergies  Allergen Reactions   Atenolol Other (See Comments)    Wt gain   Cefuroxime Axetil Nausea Only and Other (See Comments)    REACTION: nausea - but tolerates PCN   Codeine Sulfate Nausea And Vomiting and Nausea Only   Fentanyl Nausea And Vomiting and Other (See Comments)    REACTION: bad reaction   Imdur [Isosorbide Nitrate] Other (See Comments)    Headaches   Nitrofurantoin Other (See Comments)     Unknown   Pregabalin Other (See Comments)    REACTION: cp   Rapaflo [Silodosin] Other (See Comments)    Orthostatic BP drop, near syncope    Topamax [Topiramate]     Tight chest    Tramadol Other (See Comments)   Tramadol Hcl Nausea And Vomiting    REACTION: sick   Iodinated Contrast Media Rash    Objective/Physical Exam Neurovascular status intact.  Skin incisions appear to be well coapted with sutures intact. No sign of infectious process noted. No dehiscence. No active bleeding noted.  Edema appears to be improved.  Minimal edema noted  Radiographic Exam 07/02/2022 RT foot:  No significant change since last week.  Orthopedic hardware and osteotomies sites appear to be stable with routine healing.  translocation of the screws of the first metatarsal but overall good alignment of the first ray.  Slight elevation of the first metatarsal noted but no dislocation.  Assessment: 1. s/p bunionectomy with first metatarsal osteotomy and Weil shortening osteotomies of the second third digits with PIPJ arthroplasty second digit right. DOS: 06/18/2022   Plan of Care:  1. Patient was evaluated. X-rays reviewed 2.  Sutures removed.  Patient may begin washing and showering and getting the foot wet 3.  Continue minimal weightbearing in the cam boot 4.  Ace wrap is provided.  Continue compression with Ace wrap's daily 5.  Return to clinic 2 weeks for percutaneous pin removal and follow-up x-ray 6.  Toradol and gabapentin was sent to the [facility]. 7.  There is a slight dorsal elevation of the proximal fragment.  It does not appear to be causing any Bruising or tenting.  We will continue to clinically monitor.

## 2022-07-16 ENCOUNTER — Encounter: Payer: PPO | Admitting: Podiatry

## 2022-07-17 NOTE — Addendum Note (Signed)
Addended by: Deboraha Sprang on: 07/17/2022 03:44 PM   Modules accepted: Orders

## 2022-07-21 ENCOUNTER — Ambulatory Visit (INDEPENDENT_AMBULATORY_CARE_PROVIDER_SITE_OTHER): Payer: PPO | Admitting: Podiatry

## 2022-07-21 ENCOUNTER — Ambulatory Visit: Payer: PPO

## 2022-07-21 DIAGNOSIS — Z9889 Other specified postprocedural states: Secondary | ICD-10-CM

## 2022-07-21 NOTE — Progress Notes (Signed)
Chief Complaint  Patient presents with   Post-op Follow-up    POV #3 DOS 06/19/2022 BUNIONECTOMY W/OSTEOTOMY RT, HAMMERTOE ARTHROPLASTY 2ND RT, WEIL SHORTENING OSTEOTOMY 2ND, 3RD RT    Subjective:  Patient presents today status post bunionectomy with Weil shortening osteotomies of the second and third digits right. DOS: 06/18/2022.  Patient continues to do well.  She is weightbearing in the cam boot as instructed.  No new complaints at this time  Past Medical History:  Diagnosis Date   Abnormal weight gain    Acute sinusitis 12/05/2008   1/18 8/18   Arachnoid cyst 08/19/2011   Chest tightness or pressure 05/07/2018   Constipation 09/08/2012   Chronic - opioid related 11/17 Amitiza   Contact dermatitis 03/16/2013   5/14 relapsing - poison ivy    Dyspnea 05/07/2018   Elevated liver enzymes 03/04/2011   Chronic off and on    Female stress incontinence 02/06/2009   Qualifier: Diagnosis of  By: Alain Marion MD, Evie Lacks    FEVER UNSPECIFIED 09/29/2007   Qualifier: Diagnosis of  By: Alain Marion MD, Evie Lacks    GERD 09/29/2007   Chronic  Protonix prn  Potential benefits of a long term PPI use as well as potential risks  and complications were explained to the patient and were aknowledged.     GERD (gastroesophageal reflux disease)    Headache 12/20/2007   Dr Hassell Done Migraines (1 per 2 wks) Topamax 400 mg/d; Imitrex prn; Ibuprofen prn, Zofran prn    Hyperglycemia 08/19/2011   Mild     Hypertension    Increased blood pressure (not hypertension) 08/23/2014   Intractable migraine 06/14/2009   Chronic Ibuprofen, Imitrex, Topamax,  Nortriptyline - intolerant   Knee pain 03/04/2011   R knee 2018 L knee pain - L knee b anserina and L knee is tender w/ROM: L knee pain is worse - patellofemoral syndrome vs other   LBP (low back pain)    Local anesthetic drug adverse reaction 01/21/2022   LOW BACK PAIN 09/29/2007   Chronic, lately (2016) disabling Due to syringomyelia On Oxycodone  Potential benefits of a  long term opioids use as well as potential risks (i.e. addiction risk, apnea etc) and complications (i.e. Somnolence, constipation and others) were explained to the patient and were aknowledged.     LUQ PAIN 05/23/2010   Qualifier: Diagnosis of  By: Plotnikov MD, Evie Lacks    Migraine, chronic, without aura, intractable 10/28/2017   Botox approved diagnosis. 7/19 Worse. Start Emagility inj. Relpax   Migraines    Nausea & vomiting 08/19/2011   Nausea without vomiting 06/14/2009   Due to migraines     NECK PAIN 06/14/2009    Chronic, lately (2016) disabling Due to syringomyelia On Oxycodone  Potential benefits of a long term opioids use as well as potential risks (i.e. addiction risk, apnea etc) and complications (i.e. Somnolence, constipation and others) were explained to the patient and were aknowledged.     NONSPECIFIC ABNORMAL RESULTS [place] FUNCTION STUDY 09/28/2009   Qualifier: Diagnosis of  By: Alain Marion MD, Evie Lacks    OBSESSIVE-COMPULSIVE DISORDER 05/23/2010   Chronic - compulsive shopping    Onychomycosis 01/30/2016   4/17    Orthostatic hypotension 06/30/2013   9/14 likely Rapaflo induced    OTITIS EXTERNA 05/10/2008   Qualifier: Diagnosis of  By: Alain Marion MD, Evie Lacks    Palpitations 06/21/2014   PARESTHESIA 07/13/2008   Qualifier: Diagnosis of  By: Alain Marion MD, Evie Lacks    Partial  thickness burn of left wrist 09/11/2015   Postoperative state 07/24/2016   Pyelonephritis 2008   Rash 07/27/2017   Stress 2009   Syncope, near 06/30/2013   9/14 likely Rapaflo induced 12/14 relapsing off Rapaflo x 7 2/15 no relapsing since on 1/2 dose of Topamax    Syringobulbia ([place])    SYRINGOMYELIA 08/20/2007   Chronic Dr Krista Blue Dr Jalene Mullet at [facility] Chronic pain    Syringomyelia ([place])    Tachycardia    Tonsillitis, chronic 02/26/2017   2018 worse   URINARY TRACT INFECTION (UTI) 03/23/2008   Qualifier: Diagnosis of  By: Alain Marion MD, Evie Lacks    UTI (lower urinary tract infection)    Well adult exam  12/21/2013   We discussed age appropriate health related issues, including available/recomended screening tests and vaccinations. We discussed a need for adhering to healthy diet and exercise. Labs/EKG were reviewed/ordered. All questions were answered.       Past Surgical History:  Procedure Laterality Date   ABDOMINAL HYSTERECTOMY     ANTERIOR AND POSTERIOR REPAIR N/A 07/24/2016   Procedure: Possible ANTERIOR (CYSTOCELE) repair, perineoplasty;  Surgeon: Princess Bruins, MD;  Location: [facility];  Service: Gynecology;  Laterality: N/A;   BLADDER SUSPENSION N/A 07/24/2016   Procedure: TRANSVAGINAL TAPE (TVT) PROCEDURE;  Surgeon: Princess Bruins, MD;  Location: [facility];  Service: Gynecology;  Laterality: N/A;   COLONOSCOPY     CYSTOSCOPY N/A 07/24/2016   Procedure: CYSTOSCOPY;  Surgeon: Princess Bruins, MD;  Location: [facility];  Service: Gynecology;  Laterality: N/A;   intracranial decompression surgery  2006   LEFT HEART CATH AND CORONARY ANGIOGRAPHY N/A 05/07/2018   Procedure: LEFT HEART CATH AND CORONARY ANGIOGRAPHY;  Surgeon: Belva Crome, MD;  Location: [facility];  Service: Cardiovascular;  Laterality: N/A;   PARTIAL KNEE ARTHROPLASTY Left 09/07/2018   Procedure: UNICOMPARTMENTAL LEFT KNEE;  Surgeon: Renette Butters, MD;  Location: [facility];  Service: Orthopedics;  Laterality: Left;  Adductor Block   ROBOTIC ASSISTED LAPAROSCOPIC SACROCOLPOPEXY N/A 07/24/2016   Procedure: ROBOTIC ASSISTED LAPAROSCOPIC SACROCOLPOPEXY WITH PERINEOPLASTY;  Surgeon: Princess Bruins, MD;  Location: [facility];  Service: Gynecology;  Laterality: N/A;   TUBAL LIGATION      Allergies  Allergen Reactions   Atenolol Other (See Comments)    Wt gain   Cefuroxime Axetil Nausea Only and Other (See Comments)    REACTION: nausea - but tolerates PCN   Codeine Sulfate Nausea And Vomiting and Nausea Only   Fentanyl Nausea And Vomiting and Other (See Comments)    REACTION: bad reaction   Imdur [Isosorbide  Nitrate] Other (See Comments)    Headaches   Nitrofurantoin Other (See Comments)    Unknown   Pregabalin Other (See Comments)    REACTION: cp   Rapaflo [Silodosin] Other (See Comments)    Orthostatic BP drop, near syncope    Topamax [Topiramate]     Tight chest    Tramadol Other (See Comments)   Tramadol Hcl Nausea And Vomiting    REACTION: sick   Iodinated Contrast Media Rash    Objective/Physical Exam Neurovascular status intact.  Skin incisions healed.  Minimal edema noted.  Percutaneous pin is stable and intact.  Clinically overall well-healing surgical foot.  There is some slight palpable prominence noted at the base of the metatarsal consistent with findings on x-ray.  Nontender.  We will observe for now  Radiographic Exam 07/21/2022 RT foot:  No significant change since last x-rays taken.  No change in the  dorsal displacement of the proximal portion of the osteotomy.    Assessment: 1. s/p bunionectomy with first metatarsal osteotomy and Weil shortening osteotomies of the second third digits with PIPJ arthroplasty second digit right. DOS: 06/18/2022   Plan of Care:  1. Patient was evaluated. X-rays reviewed with patient.  We will simply observe for now the dorsal displacement of both bone created by the the osteotomy 2.  Percutaneous pin removed.  3.  Remain in the cam boot for an additional 2 weeks.  After that the patient may transition slowly out of the cam boot into good supportive sneakers in tennis shoes 4.  Continue compression ankle sleeve daily  5.  Return to clinic in 4 weeks for follow-up x-ray   Edrick Kins, DPM [facility]  Dr. Edrick Kins, DPM    [address], [place] [address]                Office [phone]  Fax [phone]

## 2022-08-05 NOTE — Progress Notes (Deleted)
NEUROLOGY FOLLOW UP OFFICE NOTE  Beth Huffman [id number]  Assessment/Plan:   Migraine without aura, without status migrainosus, not intractable.   1.  Migraine prevention:  Aimovig '140mg'$  2.  Migraine rescue:  Roselyn Meier.  She has sumatriptan or rizatriptan for second line 3.  Limit use of pain relievers to no more than 2 days out of week to prevent risk of rebound or medication-overuse headache. 4.  Keep headache diary 5.  Follow up 4 months.   Subjective:  Beth Huffman is a 61 year old right-handed Caucasian woman who follows up for migraines.   UPDATE: Started Aimovig last visit. Intensity:  severe Duration:  1 hour with Ubrelvy Frequency: 15 to 20 days a month for the second half of 2nd month and all of 3rd month before next round of Botox Current NSAIDS:  ibuprofen, naproxen (take with triptan) Current analgesics:  oxycodone Current triptans:  Maxalt '10mg'$  OR sumatriptan '100mg'$  (makes her chest feel tight) Current ergotamine:  none Current anti-emetic:  Zofran '8mg'$  Current muscle relaxants:  Flexeril '5mg'$  Current anti-anxiolytic:  none Current sleep aide:  none Current Antihypertensive medications:  Verapamil CR '128mg'$  Current Antidepressant medications:  none Current Anticonvulsant medications:  none Current anti-CGRP:  none Current Vitamins/Herbal/Supplements:  none Current Antihistamines/Decongestants:  Flonase Other therapy:  Botox status Hormone/birth control:  estradiol   Caffeine:  1 1/2 cups coffee daily Diet:  Keeps hydrated.  Does not skip meals.  On Optivia diet.  Lost 30 lbs since January Exercise:  Walks dogs Depression:  no; Anxiety:  no Other pain:  Spinal pain related to perineural cysts.  Recent knee replacement, still with mild soreness Sleep hygiene:  good   HISTORY:  Onset:  Since the early 1990s.  She was evaluated at [facility].  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    MRI of spine from 2005 showed a syrinx from T3 to T12 levels.  She subsequently underwent occipital posterior fossa decompression surgery that year.  A CT cystogram in 2007 showed residual cyst in the posterior fossa with postoperative changes but no hydrocephalus or other intracranial mass.  Headaches subsided following decompression.  They started to return in May 2016.  She was previously followed by Dr. Krista Blue at [facility]. Location:  Bifrontal/posterior neck pain Quality:  Pressure, throbbing Initial intensity:  10/10.  she denies new headache, thunderclap headache or severe headache that wakes her from sleep. Aura:  no Premonitory Phase:  no Postdrome:  no Associated symptoms:  With or without nausea, photophobia, phonophobia.  She denies associated unilateral numbness or weakness. Initial Duration:  1-2 days.  Often in the morning when she wakes up Initial Frequency:  15 days a month Initial Frequency of abortive medication: sumatriptan or rizatriptan 12-15 days a month Triggers:  Prolonged standing or activity/lifting Relieving factors:  Just medications. Activity:  Can't function   Past NSAIDS:  Cambia, naproxen Past analgesics:  Excedrin, Tylenol Past abortive triptans:  Relpax '40mg'$ , Zomig '5mg'$  NS, sumatriptan [facility] Past abortive ergotamine:  none Past muscle relaxants:  none Past anti-emetic:  none Past antihypertensive medications:  atenolol Past antidepressant medications:  Nortriptyline (did not feel well on it) Past anticonvulsant medications:  Trokendi XR,topiramate '200mg'$  twice daily;  gabapentin, Lyrica Past anti-CGRP: Emgality (palpitations) Past vitamins/Herbal/Supplements:  none Past antihistamines/decongestants:  none Other past therapies:  Botox (effective but expensive)  Family history of headache:  No  PAST MEDICAL HISTORY: Past Medical History:  Diagnosis Date    Abnormal weight gain    Acute sinusitis 12/05/2008   1/18 8/18   Arachnoid cyst 08/19/2011   Chest tightness or pressure 05/07/2018   Constipation 09/08/2012   Chronic - opioid related 11/17 Amitiza   Contact dermatitis 03/16/2013   5/14 relapsing - poison ivy    Dyspnea 05/07/2018   Elevated liver enzymes 03/04/2011   Chronic off and on    Female stress incontinence 02/06/2009   Qualifier: Diagnosis of  By: Alain Marion MD, Evie Lacks    FEVER UNSPECIFIED 09/29/2007   Qualifier: Diagnosis of  By: Alain Marion MD, Evie Lacks    GERD 09/29/2007   Chronic  Protonix prn  Potential benefits of a long term PPI use as well as potential risks  and complications were explained to the patient and were aknowledged.     GERD (gastroesophageal reflux disease)    Headache 12/20/2007   Dr Hassell Done Migraines (1 per 2 wks) Topamax 400 mg/d; Imitrex prn; Ibuprofen prn, Zofran prn    Hyperglycemia 08/19/2011   Mild     Hypertension    Increased blood pressure (not hypertension) 08/23/2014   Intractable migraine 06/14/2009   Chronic Ibuprofen, Imitrex, Topamax,  Nortriptyline - intolerant   Knee pain 03/04/2011   R knee 2018 L knee pain - L knee b anserina and L knee is tender w/ROM: L knee pain is worse - patellofemoral syndrome vs other   LBP (low back pain)    Local anesthetic drug adverse reaction 01/21/2022   LOW BACK PAIN 09/29/2007   Chronic, lately (2016) disabling Due to syringomyelia On Oxycodone  Potential benefits of a long term opioids use as well as potential risks (i.e. addiction risk, apnea etc) and complications (i.e. Somnolence, constipation and others) were explained to the patient and were aknowledged.     LUQ PAIN 05/23/2010   Qualifier: Diagnosis of  By: Plotnikov MD, Evie Lacks    Migraine, chronic, without aura, intractable 10/28/2017   Botox approved diagnosis. 7/19 Worse. Start Emagility inj. Relpax   Migraines    Nausea & vomiting 08/19/2011   Nausea without vomiting 06/14/2009   Due to migraines      NECK PAIN 06/14/2009    Chronic, lately (2016) disabling Due to syringomyelia On Oxycodone  Potential benefits of a long term opioids use as well as potential risks (i.e. addiction risk, apnea etc) and complications (i.e. Somnolence, constipation and others) were explained to the patient and were aknowledged.     NONSPECIFIC ABNORMAL RESULTS [place] FUNCTION STUDY 09/28/2009   Qualifier: Diagnosis of  By: Alain Marion MD, Evie Lacks    OBSESSIVE-COMPULSIVE DISORDER 05/23/2010   Chronic - compulsive shopping    Onychomycosis 01/30/2016   4/17    Orthostatic hypotension 06/30/2013   9/14 likely Rapaflo induced    OTITIS EXTERNA 05/10/2008   Qualifier: Diagnosis of  By: Alain Marion MD, Evie Lacks    Palpitations 06/21/2014   PARESTHESIA 07/13/2008   Qualifier: Diagnosis of  By: Alain Marion MD, Evie Lacks    Partial thickness burn of left wrist 09/11/2015   Postoperative state 07/24/2016   Pyelonephritis 2008   Rash 07/27/2017   Stress 2009   Syncope, near 06/30/2013   9/14 likely Rapaflo induced 12/14 relapsing off Rapaflo x 7 2/15 no relapsing since on 1/2 dose of Topamax    Syringobulbia ([place])    SYRINGOMYELIA 08/20/2007   Chronic Dr Krista Blue Dr Jalene Mullet at [facility]  Chronic pain    Syringomyelia ([facility])    Tachycardia    Tonsillitis, chronic 02/26/2017   2018 worse   URINARY TRACT INFECTION (UTI) 03/23/2008   Qualifier: Diagnosis of  By: Plotnikov MD, Evie Lacks    UTI (lower urinary tract infection)    Well adult exam 12/21/2013   We discussed age appropriate health related issues, including available/recomended screening tests and vaccinations. We discussed a need for adhering to healthy diet and exercise. Labs/EKG were reviewed/ordered. All questions were answered.       MEDICATIONS: Current Outpatient Medications on File Prior to Visit  Medication Sig Dispense Refill   albuterol (VENTOLIN HFA) 108 (90 Base) MCG/ACT inhaler Inhale 2 puffs into the lungs every 6 (six) hours as needed for wheezing or shortness of  breath. 18 g 2   cholecalciferol (VITAMIN D) 1000 UNITS tablet Take 1,000 Units by mouth daily.     cyclobenzaprine (FLEXERIL) 5 MG tablet TAKE 1 TABLET THREE TIMES DAILY AS NEEDED FOR MUSCLE SPASMS. 90 tablet 3   Erenumab-aooe (AIMOVIG) 140 MG/ML SOAJ Inject 140 mg into the skin every 28 (twenty-eight) days. 1.12 mL 5   estradiol (ESTRACE) 0.5 MG tablet Take 1 tablet (0.5 mg total) by mouth daily. 90 tablet 4   famotidine (PEPCID) 40 MG tablet Take 1 tablet (40 mg total) by mouth daily. 90 tablet 3   gabapentin (NEURONTIN) 100 MG capsule Take 1 capsule (100 mg total) by mouth 3 (three) times daily. 90 capsule 3   ibuprofen (IBU) 600 MG tablet TAKE ONE TABLET EVERY 8 HOURS AS NEEDED FOR HEADACHE 90 tablet 1   ketorolac (TORADOL) 10 MG tablet Take 1 tablet (10 mg total) by mouth every 6 (six) hours as needed. 20 tablet 0   Multiple Vitamin (MULTIVITAMIN PO) Take 1 tablet by mouth daily.     Oxycodone HCl 10 MG TABS Take 1 tablet (10 mg total) by mouth 4 (four) times daily as needed. Please fill on or after 08/19/22 120 tablet 0   Oxycodone HCl 10 MG TABS Take 1 tablet (10 mg total) by mouth 4 (four) times daily as needed. 120 tablet 0   Oxycodone HCl 10 MG TABS Take 1 tablet (10 mg total) by mouth every 6 (six) hours as needed. 120 tablet 0   pantoprazole (PROTONIX) 40 MG tablet Take 1 tablet (40 mg total) by mouth at bedtime. 90 tablet 3   polyethylene glycol (MIRALAX / GLYCOLAX) packet Take 8.5 g by mouth every other day.     rizatriptan (MAXALT) 10 MG tablet TAKE 1 TABLET AT ONSET OF MIGRAINE- MAY REPEAT ONCE IN 2 HOURS. LIMIT 2/24 HOURS. AVOID DAILY USE. 8 tablet 3   senna (SENOKOT) 8.6 MG tablet Take 1 tablet by mouth as needed for constipation.     Turmeric (QC TUMERIC COMPLEX) 500 MG CAPS Take by mouth.     UBRELVY 100 MG TABS Take by mouth.     verapamil (CALAN-SR) 120 MG CR tablet TAKE ONE TABLET AT BEDTIME 90 tablet 3   No current facility-administered medications on file prior to  visit.    ALLERGIES: Allergies  Allergen Reactions   Atenolol Other (See Comments)    Wt gain   Cefuroxime Axetil Nausea Only and Other (See Comments)    REACTION: nausea - but tolerates PCN   Codeine Sulfate Nausea And Vomiting and Nausea Only   Fentanyl Nausea And Vomiting and Other (See Comments)    REACTION: bad reaction   Imdur [Isosorbide Nitrate] Other (  See Comments)    Headaches   Nitrofurantoin Other (See Comments)    Unknown   Pregabalin Other (See Comments)    REACTION: cp   Rapaflo [Silodosin] Other (See Comments)    Orthostatic BP drop, near syncope    Topamax [Topiramate]     Tight chest    Tramadol Other (See Comments)   Tramadol Hcl Nausea And Vomiting    REACTION: sick   Iodinated Contrast Media Rash    FAMILY HISTORY: Family History  Problem Relation Age of Onset   COPD Mother    Peripheral vascular disease Mother    Hypertension Mother    Heart disease Mother    Hypertension Father    Heart attack Maternal Grandmother    Stroke Maternal Grandfather    Stroke Paternal Grandfather    Breast cancer Maternal Aunt    Colon cancer Maternal Uncle 60   Anxiety disorder Other    Asthma Granddaughter       Objective:  *** General: No acute distress.  Patient appears well-groomed.   Head:  Normocephalic/atraumatic Eyes:  Fundi examined but not visualized Neck: supple, no paraspinal tenderness, full range of motion Heart:  Regular rate and rhythm Neurological Exam: ***   Metta Clines, DO  CC: Lew Dawes, MD

## 2022-08-08 ENCOUNTER — Ambulatory Visit: Payer: PPO | Admitting: Neurology

## 2022-08-12 ENCOUNTER — Telehealth: Payer: Self-pay | Admitting: *Deleted

## 2022-08-12 ENCOUNTER — Other Ambulatory Visit: Payer: Self-pay | Admitting: Internal Medicine

## 2022-08-12 NOTE — Telephone Encounter (Signed)
Rec FAX PT IS NEEDING PA FOR ONDANSETRON 8 MG. Submitted w/ ([facility])

## 2022-08-12 NOTE — Telephone Encounter (Signed)
original prescription was discontinued on 02/13/2022 by Susy Manor, CMA. Pls advise on refill.Marland KitchenJohny Chess

## 2022-08-14 ENCOUNTER — Telehealth: Payer: Self-pay | Admitting: *Deleted

## 2022-08-14 MED ORDER — NAPROXEN 500 MG PO TABS: 500.0000 mg | ORAL_TABLET | Freq: Two times a day (BID) | ORAL | 0 refills | Status: DC | PRN

## 2022-08-14 NOTE — Telephone Encounter (Signed)
Rec'd fax pt is wanting refill on Naproxen 500 mg. Last filled 02/20/22. MD out of the office until Monday 10/23. Pls advise../l;mb

## 2022-08-14 NOTE — Telephone Encounter (Signed)
Ok done to [facility]

## 2022-08-14 NOTE — Telephone Encounter (Signed)
Rec'd denial on PA for ondansetron 8 mg. Resubmitted another PA w/(Key: [id number]) waiting on status.Marland KitchenJohny Chess

## 2022-08-15 ENCOUNTER — Other Ambulatory Visit: Payer: Self-pay | Admitting: Internal Medicine

## 2022-08-18 MED ORDER — NAPROXEN 500 MG PO TABS: 500.0000 mg | ORAL_TABLET | Freq: Two times a day (BID) | ORAL | 0 refills | Status: DC | PRN

## 2022-08-18 NOTE — Telephone Encounter (Signed)
Noted.  Beth Huffman should contact her plan and find out what her options are. Thanks

## 2022-08-18 NOTE — Telephone Encounter (Signed)
Rec'd determination med was still denied It states PA was denied under Medicare Part D because med not prescribe for medically accepted indication.Marland KitchenJohny Huffman

## 2022-08-19 ENCOUNTER — Encounter: Payer: Self-pay | Admitting: Internal Medicine

## 2022-08-19 ENCOUNTER — Ambulatory Visit (INDEPENDENT_AMBULATORY_CARE_PROVIDER_SITE_OTHER): Payer: PPO | Admitting: Internal Medicine

## 2022-08-19 DIAGNOSIS — G950 Syringomyelia and syringobulbia: Secondary | ICD-10-CM | POA: Diagnosis not present

## 2022-08-19 DIAGNOSIS — G43719 Chronic migraine without aura, intractable, without status migrainosus: Secondary | ICD-10-CM | POA: Diagnosis not present

## 2022-08-19 DIAGNOSIS — Z23 Encounter for immunization: Secondary | ICD-10-CM

## 2022-08-19 DIAGNOSIS — M25521 Pain in right elbow: Secondary | ICD-10-CM | POA: Diagnosis not present

## 2022-08-19 DIAGNOSIS — G8929 Other chronic pain: Secondary | ICD-10-CM

## 2022-08-19 DIAGNOSIS — M5441 Lumbago with sciatica, right side: Secondary | ICD-10-CM | POA: Diagnosis not present

## 2022-08-19 DIAGNOSIS — M5442 Lumbago with sciatica, left side: Secondary | ICD-10-CM | POA: Diagnosis not present

## 2022-08-19 MED ORDER — OXYCODONE HCL 20 MG PO TABS: 10.0000 mg | ORAL_TABLET | Freq: Four times a day (QID) | ORAL | 0 refills | Status: DC | PRN

## 2022-08-19 NOTE — Assessment & Plan Note (Signed)
Better on added Gabapentin

## 2022-08-19 NOTE — Progress Notes (Addendum)
Subjective:  Patient ID: Beth Huffman, female    DOB: 18-May-1961  Age: 61 y.o. MRN: [id number]  CC: Follow-up   HPI Beth Huffman presents for R foot pain post-op 8 wks ago F/u on SOB - better now since it is colder F/u LBP, HA - better on Gabapentin 100 mg 2 a day   Outpatient Medications Prior to Visit  Medication Sig Dispense Refill   albuterol (VENTOLIN HFA) 108 (90 Base) MCG/ACT inhaler Inhale 2 puffs into the lungs every 6 (six) hours as needed for wheezing or shortness of breath. 18 g 2   cholecalciferol (VITAMIN D) 1000 UNITS tablet Take 1,000 Units by mouth daily.     cyclobenzaprine (FLEXERIL) 5 MG tablet TAKE 1 TABLET THREE TIMES DAILY AS NEEDED FOR MUSCLE SPASMS. 90 tablet 3   Erenumab-aooe (AIMOVIG) 140 MG/ML SOAJ Inject 140 mg into the skin every 28 (twenty-eight) days. 1.12 mL 5   estradiol (ESTRACE) 0.5 MG tablet Take 1 tablet (0.5 mg total) by mouth daily. 90 tablet 4   famotidine (PEPCID) 40 MG tablet Take 1 tablet (40 mg total) by mouth daily. 90 tablet 3   gabapentin (NEURONTIN) 100 MG capsule Take 1 capsule (100 mg total) by mouth 3 (three) times daily. 90 capsule 3   Multiple Vitamin (MULTIVITAMIN PO) Take 1 tablet by mouth daily.     naproxen (NAPROSYN) 500 MG tablet TAKE 1 TABLET BY MOUTH TWICE DAILY WITH A MEAL. 60 tablet 3   pantoprazole (PROTONIX) 40 MG tablet Take 1 tablet (40 mg total) by mouth at bedtime. 90 tablet 3   polyethylene glycol (MIRALAX / GLYCOLAX) packet Take 8.5 g by mouth every other day.     rizatriptan (MAXALT) 10 MG tablet TAKE 1 TABLET AT ONSET OF MIGRAINE- MAY REPEAT ONCE IN 2 HOURS. LIMIT 2/24 HOURS. AVOID DAILY USE. 8 tablet 3   senna (SENOKOT) 8.6 MG tablet Take 1 tablet by mouth as needed for constipation.     Turmeric (QC TUMERIC COMPLEX) 500 MG CAPS Take by mouth.     UBRELVY 100 MG TABS Take by mouth.     verapamil (CALAN-SR) 120 MG CR tablet TAKE ONE TABLET AT BEDTIME 90 tablet 3   naproxen (NAPROSYN) 500 MG tablet Take  1 tablet (500 mg total) by mouth 2 (two) times daily as needed. 60 tablet 0   ondansetron (ZOFRAN) 8 MG tablet ORAL FOR 7 DAYS (Patient not taking: Reported on 08/19/2022)     No facility-administered medications prior to visit.    ROS: Review of Systems  Constitutional:  Positive for fatigue. Negative for activity change, appetite change, chills and unexpected weight change.  HENT:  Negative for congestion, mouth sores and sinus pressure.   Eyes:  Negative for visual disturbance.  Respiratory:  Negative for cough and chest tightness.   Cardiovascular:  Positive for leg swelling.  Gastrointestinal:  Negative for abdominal pain and nausea.  Genitourinary:  Negative for difficulty urinating, frequency and vaginal pain.  Musculoskeletal:  Positive for back pain and gait problem.  Skin:  Negative for pallor and rash.  Neurological:  Negative for dizziness, tremors, weakness, numbness and headaches.  Psychiatric/Behavioral:  Negative for confusion, sleep disturbance and suicidal ideas. The patient is nervous/anxious.     Objective:  BP 134/82 (BP Location: Left Arm)   Pulse 75   Temp 97.9 F (36.6 C) (Oral)   Ht '5\' 4"'$  (1.626 m)   Wt 148 lb (67.1 kg)   SpO2 97%  BMI 25.40 kg/m   BP Readings from Last 3 Encounters:  08/19/22 134/82  06/09/22 112/78  05/30/22 110/70    Wt Readings from Last 3 Encounters:  08/19/22 148 lb (67.1 kg)  06/09/22 147 lb (66.7 kg)  04/23/22 144 lb (65.3 kg)    Physical Exam Constitutional:      General: She is not in acute distress.    Appearance: She is well-developed.  HENT:     Head: Normocephalic.     Right Ear: External ear normal.     Left Ear: External ear normal.     Nose: Nose normal.  Eyes:     General:        Right eye: No discharge.        Left eye: No discharge.     Conjunctiva/sclera: Conjunctivae normal.     Pupils: Pupils are equal, round, and reactive to light.  Neck:     Thyroid: No thyromegaly.     Vascular: No JVD.      Trachea: No tracheal deviation.  Cardiovascular:     Rate and Rhythm: Normal rate and regular rhythm.     Heart sounds: Normal heart sounds.  Pulmonary:     Effort: No respiratory distress.     Breath sounds: No stridor. No wheezing.  Abdominal:     General: Bowel sounds are normal. There is no distension.     Palpations: Abdomen is soft. There is no mass.     Tenderness: There is no abdominal tenderness. There is no guarding or rebound.  Musculoskeletal:        General: No tenderness.     Cervical back: Normal range of motion and neck supple. No rigidity.  Lymphadenopathy:     Cervical: No cervical adenopathy.  Skin:    Findings: No erythema or rash.  Neurological:     Cranial Nerves: No cranial nerve deficit.     Motor: No abnormal muscle tone.     Coordination: Coordination normal.     Deep Tendon Reflexes: Reflexes normal.  Psychiatric:        Behavior: Behavior normal.        Thought Content: Thought content normal.        Judgment: Judgment normal.   L foot w/scars, swelling  Lab Results  Component Value Date   WBC 5.2 04/25/2021   HGB 13.3 04/25/2021   HCT 39.6 04/25/2021   PLT 210.0 04/25/2021   GLUCOSE 84 04/04/2022   CHOL 225 (H) 04/25/2021   TRIG 110.0 04/25/2021   HDL 70.00 04/25/2021   LDLDIRECT 130.2 12/21/2013   LDLCALC 133 (H) 04/25/2021   ALT 27 04/25/2021   AST 25 04/25/2021   NA 138 04/04/2022   K 4.6 04/04/2022   CL 102 04/04/2022   CREATININE 0.79 04/04/2022   BUN 12 04/04/2022   CO2 26 04/04/2022   TSH 1.81 04/25/2021   HGBA1C 5.5 03/31/2018    MR Thoracic Spine Wo Contrast  Result Date: 05/08/2022 CLINICAL DATA:  Syringomyelia EXAM: MRI THORACIC SPINE WITHOUT CONTRAST TECHNIQUE: Multiplanar, multisequence MR imaging of the thoracic spine was performed. No intravenous contrast was administered. COMPARISON:  MRI 04/18/2008 FINDINGS: Alignment:  Normal thoracic alignment. Vertebrae: No fracture or focal bone lesion. Cord: The cord is  normal from T1 to the level of the T3-4 disc. There is dilatation of the central canal or a thoracic cord syrinx extending from the T3-4 disc level as far as the T11-12 disc level. Largest diameter is at the lower T4  level measuring 3 mm and at the T10 level measuring 3.6 mm. This is actually smaller than was seen in 2009 when the diameter at lower T4 measured 3.2 mm and at T10 with a maximal diameter was also 3.6 mm but there was a fuller/more rounded configuration. Paraspinal and other soft tissues: Otherwise normal. Few incidental root sleeve cysts. Disc levels: No compressive disc pathology. Minimal midthoracic disc bulges but wide patency of the canal and foramina. IMPRESSION: No progressive or worsening finding compared to the study of 2009. Thoracic syrinx extending from the level of the T3-4 disc to the T11-12 disc level. The syrinx is actually slightly smaller when compared to the prior study, maximal transverse diameter at T4 measuring 3 mm today compared with 3.2 mm previously and with slight reduction in transverse diameter in the lower thoracic region, though the maximal measurement at the T10 level remains 3.6 mm. Electronically Signed   By: Nelson Chimes M.D.   On: 05/08/2022 15:55   MR Cervical Spine Wo Contrast  Result Date: 05/08/2022 CLINICAL DATA:  Syringomyelia.  Prior brain surgery. EXAM: MRI CERVICAL SPINE WITHOUT CONTRAST TECHNIQUE: Multiplanar, multisequence MR imaging of the cervical spine was performed. No intravenous contrast was administered. COMPARISON:  04/18/2008 partial examination FINDINGS: Alignment: Normal Vertebrae: Normal Cord: As described previously, there is mild prominence of the central canal from the upper C6 vertebral body level to the C7-T1 disc level. Maximal transverse diameter is 2 mm, no larger than what was described in 2009. Otherwise the cord signal is normal. Posterior Fossa, vertebral arteries, paraspinal tissues: Mega cisterna magna as described on brain  MRI. Disc levels: No significant disc level pathology. No disc herniation or stenosis of the canal or foramina. The patient does have facet osteoarthritis on the right at C3-4 and C4-5 with small synovial cysts but no compressive effect upon the neural structures. IMPRESSION: Prominence of the central canal from upper C6 to the level of the C7-T1 disc, maximal transverse diameter 2 mm, the same as was described in 2009. No compressive pathology of the canal or foramina. Mild facet osteoarthritis on the right at C3-4 and C4-5. Electronically Signed   By: Nelson Chimes M.D.   On: 05/08/2022 15:48   MR Brain Wo Contrast  Result Date: 05/08/2022 CLINICAL DATA:  Syringomyelia or syringobulbia. Headaches. History of brain surgery. EXAM: MRI HEAD WITHOUT CONTRAST TECHNIQUE: Multiplanar, multiecho pulse sequences of the brain and surrounding structures were obtained without intravenous contrast. COMPARISON:  Head CT 08/14/2011.  MRI 05/20/2007. FINDINGS: Brain: Distant previous suboccipital craniotomy. Mega cisterna magna is unchanged since the study of 2008. No mass effect. The obex is normal. No focal abnormality otherwise affecting the brainstem or cerebellum. Cerebral hemispheres are again normal with a single punctate focus of T2 and FLAIR signal in the right temporal lobe, not significant. No mass lesion, hemorrhage, hydrocephalus or extra-axial collection. Vascular: Major vessels at the base of the brain show flow. Skull and upper cervical spine: Negative Sinuses/Orbits: Clear/normal Other: None IMPRESSION: Distant suboccipital craniotomy. Mega cisterna magna, unchanged in appearance compared to the examination of 05/20/2007. Otherwise normal appearance of the brain with exception of a punctate focus of T2 and FLAIR signal in the right temporal lobe, unchanged since 2008, not significant. Electronically Signed   By: Nelson Chimes M.D.   On: 05/08/2022 15:43    Assessment & Plan:   Problem List Items Addressed  This Visit     Elbow pain, chronic, right    Cont w/pain  Relevant Medications   Oxycodone HCl 20 MG TABS   Oxycodone HCl 20 MG TABS   Oxycodone HCl 20 MG TABS   LOW BACK PAIN    Due to syringomyelia On Oxycodone  Potential benefits of a long term opioids use as well as potential risks (i.e. addiction risk, apnea etc) and complications (i.e. Somnolence, constipation and others) were explained to the patient and were aknowledged.      Relevant Medications   Oxycodone HCl 20 MG TABS   Oxycodone HCl 20 MG TABS   Oxycodone HCl 20 MG TABS   Migraine, chronic, without aura, intractable (Chronic)    Better on added Gabapentin      Relevant Medications   Oxycodone HCl 20 MG TABS   Oxycodone HCl 20 MG TABS   Oxycodone HCl 20 MG TABS   SYRINGOMYELIA     Will order MRI of the brain, C spine, T-spine RTC in 1 mo         Meds ordered this encounter  Medications   Oxycodone HCl 20 MG TABS    Sig: Take 0.5 tablets (10 mg total) by mouth 4 (four) times daily as needed.    Dispense:  60 tablet    Refill:  0    Please fill on or after 10/18/22   Oxycodone HCl 20 MG TABS    Sig: Take 0.5 tablets (10 mg total) by mouth 4 (four) times daily as needed. Please fill on or after 09/18/22    Dispense:  60 tablet    Refill:  0   Oxycodone HCl 20 MG TABS    Sig: Take 0.5 tablets (10 mg total) by mouth 4 (four) times daily as needed.    Dispense:  60 tablet    Refill:  0    Please fill on or after 08/19/22      Follow-up: Return in about 3 months (around 11/19/2022) for a follow-up visit.  Walker Kehr, MD

## 2022-08-19 NOTE — Patient Instructions (Signed)
Elevate foot Blue-Emu cream  -use 2-3 times a day

## 2022-08-19 NOTE — Addendum Note (Signed)
Addended by: Cassandria Anger on: 08/19/2022 09:50 AM   Modules accepted: Orders

## 2022-08-19 NOTE — Assessment & Plan Note (Signed)
Cont w/pain

## 2022-08-19 NOTE — Assessment & Plan Note (Signed)
  Will order MRI of the brain, C spine, T-spine RTC in 1 mo

## 2022-08-19 NOTE — Assessment & Plan Note (Signed)
Due to syringomyelia On Oxycodone  Potential benefits of a long term opioids use as well as potential risks (i.e. addiction risk, apnea etc) and complications (i.e. Somnolence, constipation and others) were explained to the patient and were aknowledged. 

## 2022-08-20 ENCOUNTER — Ambulatory Visit (INDEPENDENT_AMBULATORY_CARE_PROVIDER_SITE_OTHER): Payer: PPO

## 2022-08-20 ENCOUNTER — Ambulatory Visit: Payer: PPO | Admitting: Podiatry

## 2022-08-20 DIAGNOSIS — Z9889 Other specified postprocedural states: Secondary | ICD-10-CM

## 2022-08-20 NOTE — Progress Notes (Signed)
Chief Complaint  Patient presents with   Follow-up    Patient is here for right foot follow-up.patiet states that her healing is going well.    Subjective:  Patient presents today status post bunionectomy with Weil shortening osteotomies of the second and third digits right. DOS: 06/18/2022.  Patient states that she is doing very well with exception of the "bump" over the dorsum of the foot.  She says it rubs against her shoes and causes irritation and tenderness daily.  She continues to have some mild to moderate edema and she wears her compression daily.  Overall she is very satisfied with the surgery with exception of the symptomatic bump on the foot.  Past Medical History:  Diagnosis Date   Abnormal weight gain    Acute sinusitis 12/05/2008   1/18 8/18   Arachnoid cyst 08/19/2011   Chest tightness or pressure 05/07/2018   Constipation 09/08/2012   Chronic - opioid related 11/17 Amitiza   Contact dermatitis 03/16/2013   5/14 relapsing - poison ivy    Dyspnea 05/07/2018   Elevated liver enzymes 03/04/2011   Chronic off and on    Female stress incontinence 02/06/2009   Qualifier: Diagnosis of  By: Alain Marion MD, Evie Lacks    FEVER UNSPECIFIED 09/29/2007   Qualifier: Diagnosis of  By: Alain Marion MD, Evie Lacks    GERD 09/29/2007   Chronic  Protonix prn  Potential benefits of a long term PPI use as well as potential risks  and complications were explained to the patient and were aknowledged.     GERD (gastroesophageal reflux disease)    Headache 12/20/2007   Dr Hassell Done Migraines (1 per 2 wks) Topamax 400 mg/d; Imitrex prn; Ibuprofen prn, Zofran prn    Hyperglycemia 08/19/2011   Mild     Hypertension    Increased blood pressure (not hypertension) 08/23/2014   Intractable migraine 06/14/2009   Chronic Ibuprofen, Imitrex, Topamax,  Nortriptyline - intolerant   Knee pain 03/04/2011   R knee 2018 L knee pain - L knee b anserina and L knee is tender w/ROM: L knee pain is worse - patellofemoral  syndrome vs other   LBP (low back pain)    Local anesthetic drug adverse reaction 01/21/2022   LOW BACK PAIN 09/29/2007   Chronic, lately (2016) disabling Due to syringomyelia On Oxycodone  Potential benefits of a long term opioids use as well as potential risks (i.e. addiction risk, apnea etc) and complications (i.e. Somnolence, constipation and others) were explained to the patient and were aknowledged.     LUQ PAIN 05/23/2010   Qualifier: Diagnosis of  By: Plotnikov MD, Evie Lacks    Migraine, chronic, without aura, intractable 10/28/2017   Botox approved diagnosis. 7/19 Worse. Start Emagility inj. Relpax   Migraines    Nausea & vomiting 08/19/2011   Nausea without vomiting 06/14/2009   Due to migraines     NECK PAIN 06/14/2009    Chronic, lately (2016) disabling Due to syringomyelia On Oxycodone  Potential benefits of a long term opioids use as well as potential risks (i.e. addiction risk, apnea etc) and complications (i.e. Somnolence, constipation and others) were explained to the patient and were aknowledged.     NONSPECIFIC ABNORMAL RESULTS LIVR FUNCTION STUDY 09/28/2009   Qualifier: Diagnosis of  By: Alain Marion MD, Evie Lacks    OBSESSIVE-COMPULSIVE DISORDER 05/23/2010   Chronic - compulsive shopping    Onychomycosis 01/30/2016   4/17    Orthostatic hypotension 06/30/2013   9/14 likely Rapaflo induced  OTITIS EXTERNA 05/10/2008   Qualifier: Diagnosis of  By: Alain Marion MD, Evie Lacks    Palpitations 06/21/2014   PARESTHESIA 07/13/2008   Qualifier: Diagnosis of  By: Alain Marion MD, Evie Lacks    Partial thickness burn of left wrist 09/11/2015   Postoperative state 07/24/2016   Pyelonephritis 2008   Rash 07/27/2017   Stress 2009   Syncope, near 06/30/2013   9/14 likely Rapaflo induced 12/14 relapsing off Rapaflo x 7 2/15 no relapsing since on 1/2 dose of Topamax    Syringobulbia ([place])    SYRINGOMYELIA 08/20/2007   Chronic Dr Krista Blue Dr [facility] at [facility] Chronic pain    Syringomyelia ([place])     Tachycardia    Tonsillitis, chronic 02/26/2017   2018 worse   URINARY TRACT INFECTION (UTI) 03/23/2008   Qualifier: Diagnosis of  By: Alain Marion MD, Evie Lacks    UTI (lower urinary tract infection)    Well adult exam 12/21/2013   We discussed age appropriate health related issues, including available/recomended screening tests and vaccinations. We discussed a need for adhering to healthy diet and exercise. Labs/EKG were reviewed/ordered. All questions were answered.       Past Surgical History:  Procedure Laterality Date   ABDOMINAL HYSTERECTOMY     ANTERIOR AND POSTERIOR REPAIR N/A 07/24/2016   Procedure: Possible ANTERIOR (CYSTOCELE) repair, perineoplasty;  Surgeon: Princess Bruins, MD;  Location: [facility];  Service: Gynecology;  Laterality: N/A;   BLADDER SUSPENSION N/A 07/24/2016   Procedure: TRANSVAGINAL TAPE (TVT) PROCEDURE;  Surgeon: Princess Bruins, MD;  Location: [facility];  Service: Gynecology;  Laterality: N/A;   COLONOSCOPY     CYSTOSCOPY N/A 07/24/2016   Procedure: CYSTOSCOPY;  Surgeon: Princess Bruins, MD;  Location: [facility];  Service: Gynecology;  Laterality: N/A;   intracranial decompression surgery  2006   LEFT HEART CATH AND CORONARY ANGIOGRAPHY N/A 05/07/2018   Procedure: LEFT HEART CATH AND CORONARY ANGIOGRAPHY;  Surgeon: Belva Crome, MD;  Location: [facility];  Service: Cardiovascular;  Laterality: N/A;   PARTIAL KNEE ARTHROPLASTY Left 09/07/2018   Procedure: UNICOMPARTMENTAL LEFT KNEE;  Surgeon: Renette Butters, MD;  Location: [facility];  Service: Orthopedics;  Laterality: Left;  Adductor Block   ROBOTIC ASSISTED LAPAROSCOPIC SACROCOLPOPEXY N/A 07/24/2016   Procedure: ROBOTIC ASSISTED LAPAROSCOPIC SACROCOLPOPEXY WITH PERINEOPLASTY;  Surgeon: Princess Bruins, MD;  Location: [facility];  Service: Gynecology;  Laterality: N/A;   TUBAL LIGATION      Allergies  Allergen Reactions   Atenolol Other (See Comments)    Wt gain   Cefuroxime Axetil Nausea Only and Other (See  Comments)    REACTION: nausea - but tolerates PCN   Codeine Sulfate Nausea And Vomiting and Nausea Only   Fentanyl Nausea And Vomiting and Other (See Comments)    REACTION: bad reaction   Imdur [Isosorbide Nitrate] Other (See Comments)    Headaches   Nitrofurantoin Other (See Comments)    Unknown   Pregabalin Other (See Comments)    REACTION: cp   Rapaflo [Silodosin] Other (See Comments)    Orthostatic BP drop, near syncope    Topamax [Topiramate]     Tight chest    Tramadol Other (See Comments)   Tramadol Hcl Nausea And Vomiting    REACTION: sick   Iodinated Contrast Media Rash    Objective/Physical Exam Neurovascular status intact.  Skin incisions healed.  There continues to be some moderate edema to the forefoot.  Good range of motion.  Negative pain with palpation.  There is pain  however with the dorsal piece of bone overlying the dorsal base of the first metatarsal which correlates clinically on radiographic exam.  This is the only area of symptoms for the patient.  Radiographic Exam 08/20/2022 RT foot:  No significant change since last x-rays taken.  No change in the dorsal displacement of the proximal portion of the osteotomy.    Assessment: 1. s/p bunionectomy with first metatarsal osteotomy and Weil shortening osteotomies of the second third digits with PIPJ arthroplasty second digit right. DOS: 06/18/2022   Plan of Care:  1. Patient was evaluated. X-rays reviewed with patient.  We will simply observe for now the dorsal displacement of both bone created by the the osteotomy 2.  The dorsal displacement of bone fragment visualized on lateral view continues to be symptomatic and painful with close toed shoes.  She says that everything else around the surgical sites feels well.  At this time I do feel it is appropriate to return to the OR for exostectomy/excision of the dorsal bone spur that developed iatrogenically.  Risk benefits advantages and disadvantages as well as the  postoperative recovery were explained in detail.  No guarantees were expressed or implied.  Patient understands and is ready to proceed with the surgery 3.  Authorization for surgery was initiated today.  Surgery will consist of exostectomy right foot 4.  Return to clinic 1 week postop  *Hosting a gardening party at her home 09/04/2022 with 25 women   Edrick Kins, DPM [facility]  Dr. Edrick Kins, DPM    [address], [place] [address]                Office [phone]  Fax [phone]

## 2022-08-22 ENCOUNTER — Telehealth: Payer: Self-pay | Admitting: Podiatry

## 2022-08-22 NOTE — Telephone Encounter (Signed)
DOS: 09/11/2022  [employer]  Procedure: Exostectomy Rt ([id number])  DX: M25.774  [employer] Authorization #: [id number]  Authorization Valid: 09/11/2022 - 12/10/2022

## 2022-09-04 ENCOUNTER — Other Ambulatory Visit: Payer: Self-pay | Admitting: Internal Medicine

## 2022-09-11 DIAGNOSIS — M25774 Osteophyte, right foot: Secondary | ICD-10-CM | POA: Diagnosis not present

## 2022-09-11 DIAGNOSIS — M898X7 Other specified disorders of bone, ankle and foot: Secondary | ICD-10-CM | POA: Diagnosis not present

## 2022-09-17 ENCOUNTER — Ambulatory Visit (INDEPENDENT_AMBULATORY_CARE_PROVIDER_SITE_OTHER): Payer: PPO | Admitting: Podiatry

## 2022-09-17 ENCOUNTER — Ambulatory Visit: Payer: PPO

## 2022-09-17 DIAGNOSIS — Z9889 Other specified postprocedural states: Secondary | ICD-10-CM | POA: Diagnosis not present

## 2022-09-17 DIAGNOSIS — M2042 Other hammer toe(s) (acquired), left foot: Secondary | ICD-10-CM

## 2022-09-17 DIAGNOSIS — M2041 Other hammer toe(s) (acquired), right foot: Secondary | ICD-10-CM

## 2022-09-17 MED ORDER — GABAPENTIN 100 MG PO CAPS: 100.0000 mg | ORAL_CAPSULE | Freq: Three times a day (TID) | ORAL | 0 refills | Status: DC

## 2022-09-17 NOTE — Progress Notes (Signed)
Subjective:  Patient ID: Beth Huffman, female    DOB: 18-Feb-1961,  MRN: [id number]  Chief Complaint  Patient presents with   Routine Post Op    Pt states there has been no bleeding or drainage , she says she has no concerns     DOS: 09/11/22 Procedure: Right foot exostectomy  61 y.o. female returns for post-op check.  Patient states she is doing well.  Pain is controlled.  Gabapentin has been helping denies any other acute complaints bandages clean dry and intact known to Dr. Amalia Hailey  Review of Systems: Negative except as noted in the HPI. Denies N/V/F/Ch.  Past Medical History:  Diagnosis Date   Abnormal weight gain    Acute sinusitis 12/05/2008   1/18 8/18   Arachnoid cyst 08/19/2011   Chest tightness or pressure 05/07/2018   Constipation 09/08/2012   Chronic - opioid related 11/17 Amitiza   Contact dermatitis 03/16/2013   5/14 relapsing - poison ivy    Dyspnea 05/07/2018   Elevated liver enzymes 03/04/2011   Chronic off and on    Female stress incontinence 02/06/2009   Qualifier: Diagnosis of  By: Alain Marion MD, Evie Lacks    FEVER UNSPECIFIED 09/29/2007   Qualifier: Diagnosis of  By: Alain Marion MD, Evie Lacks    GERD 09/29/2007   Chronic  Protonix prn  Potential benefits of a long term PPI use as well as potential risks  and complications were explained to the patient and were aknowledged.     GERD (gastroesophageal reflux disease)    Headache 12/20/2007   Dr Hassell Done Migraines (1 per 2 wks) Topamax 400 mg/d; Imitrex prn; Ibuprofen prn, Zofran prn    Hyperglycemia 08/19/2011   Mild     Hypertension    Increased blood pressure (not hypertension) 08/23/2014   Intractable migraine 06/14/2009   Chronic Ibuprofen, Imitrex, Topamax,  Nortriptyline - intolerant   Knee pain 03/04/2011   R knee 2018 L knee pain - L knee b anserina and L knee is tender w/ROM: L knee pain is worse - patellofemoral syndrome vs other   LBP (low back pain)    Local anesthetic drug adverse reaction 01/21/2022    LOW BACK PAIN 09/29/2007   Chronic, lately (2016) disabling Due to syringomyelia On Oxycodone  Potential benefits of a long term opioids use as well as potential risks (i.e. addiction risk, apnea etc) and complications (i.e. Somnolence, constipation and others) were explained to the patient and were aknowledged.     LUQ PAIN 05/23/2010   Qualifier: Diagnosis of  By: Plotnikov MD, Evie Lacks    Migraine, chronic, without aura, intractable 10/28/2017   Botox approved diagnosis. 7/19 Worse. Start Emagility inj. Relpax   Migraines    Nausea & vomiting 08/19/2011   Nausea without vomiting 06/14/2009   Due to migraines     NECK PAIN 06/14/2009    Chronic, lately (2016) disabling Due to syringomyelia On Oxycodone  Potential benefits of a long term opioids use as well as potential risks (i.e. addiction risk, apnea etc) and complications (i.e. Somnolence, constipation and others) were explained to the patient and were aknowledged.     NONSPECIFIC ABNORMAL RESULTS LIVR FUNCTION STUDY 09/28/2009   Qualifier: Diagnosis of  By: Alain Marion MD, Evie Lacks    OBSESSIVE-COMPULSIVE DISORDER 05/23/2010   Chronic - compulsive shopping    Onychomycosis 01/30/2016   4/17    Orthostatic hypotension 06/30/2013   9/14 likely Rapaflo induced    OTITIS EXTERNA 05/10/2008   Qualifier: Diagnosis of  ByAlain Marion MD, Evie Lacks    Palpitations 06/21/2014   PARESTHESIA 07/13/2008   Qualifier: Diagnosis of  By: Alain Marion MD, Evie Lacks    Partial thickness burn of left wrist 09/11/2015   Postoperative state 07/24/2016   Pyelonephritis 2008   Rash 07/27/2017   Stress 2009   Syncope, near 06/30/2013   9/14 likely Rapaflo induced 12/14 relapsing off Rapaflo x 7 2/15 no relapsing since on 1/2 dose of Topamax    Syringobulbia ([place])    SYRINGOMYELIA 08/20/2007   Chronic Dr Krista Blue Dr Jalene Mullet at [facility] Chronic pain    Syringomyelia ([place])    Tachycardia    Tonsillitis, chronic 02/26/2017   2018 worse   URINARY TRACT INFECTION (UTI) 03/23/2008    Qualifier: Diagnosis of  By: Alain Marion MD, Evie Lacks    UTI (lower urinary tract infection)    Well adult exam 12/21/2013   We discussed age appropriate health related issues, including available/recomended screening tests and vaccinations. We discussed a need for adhering to healthy diet and exercise. Labs/EKG were reviewed/ordered. All questions were answered.       Current Outpatient Medications:    gabapentin (NEURONTIN) 100 MG capsule, Take 1 capsule (100 mg total) by mouth 3 (three) times daily., Disp: 90 capsule, Rfl: 0   albuterol (VENTOLIN HFA) 108 (90 Base) MCG/ACT inhaler, Inhale 2 puffs into the lungs every 6 (six) hours as needed for wheezing or shortness of breath., Disp: 18 g, Rfl: 2   cholecalciferol (VITAMIN D) 1000 UNITS tablet, Take 1,000 Units by mouth daily., Disp: , Rfl:    cyclobenzaprine (FLEXERIL) 5 MG tablet, TAKE 1 TABLET THREE TIMES DAILY AS NEEDED FOR MUSCLE SPASMS., Disp: 90 tablet, Rfl: 3   Erenumab-aooe (AIMOVIG) 140 MG/ML SOAJ, Inject 140 mg into the skin every 28 (twenty-eight) days., Disp: 1.12 mL, Rfl: 5   estradiol (ESTRACE) 0.5 MG tablet, Take 1 tablet (0.5 mg total) by mouth daily., Disp: 90 tablet, Rfl: 4   famotidine (PEPCID) 40 MG tablet, Take 1 tablet (40 mg total) by mouth daily., Disp: 90 tablet, Rfl: 3   gabapentin (NEURONTIN) 100 MG capsule, Take 1 capsule (100 mg total) by mouth 3 (three) times daily., Disp: 90 capsule, Rfl: 3   Multiple Vitamin (MULTIVITAMIN PO), Take 1 tablet by mouth daily., Disp: , Rfl:    naproxen (NAPROSYN) 500 MG tablet, TAKE 1 TABLET BY MOUTH TWICE DAILY WITH A MEAL., Disp: 60 tablet, Rfl: 3   Oxycodone HCl 20 MG TABS, Take 0.5 tablets (10 mg total) by mouth 4 (four) times daily as needed., Disp: 60 tablet, Rfl: 0   Oxycodone HCl 20 MG TABS, Take 0.5 tablets (10 mg total) by mouth 4 (four) times daily as needed., Disp: 60 tablet, Rfl: 0   Oxycodone HCl 20 MG TABS, Take 0.5 tablets (10 mg total) by mouth 4 (four) times daily as  needed., Disp: 60 tablet, Rfl: 0   pantoprazole (PROTONIX) 40 MG tablet, Take 1 tablet (40 mg total) by mouth at bedtime., Disp: 90 tablet, Rfl: 3   polyethylene glycol (MIRALAX / GLYCOLAX) packet, Take 8.5 g by mouth every other day., Disp: , Rfl:    rizatriptan (MAXALT) 10 MG tablet, TAKE 1 TABLET AT ONSET OF MIGRAINE- MAY REPEAT ONCE IN 2 HOURS. LIMIT 2/24 HOURS. AVOID DAILY USE., Disp: 8 tablet, Rfl: 3   senna (SENOKOT) 8.6 MG tablet, Take 1 tablet by mouth as needed for constipation., Disp: , Rfl:    Turmeric (QC TUMERIC COMPLEX) 500 MG CAPS,  Take by mouth., Disp: , Rfl:    UBRELVY 100 MG TABS, Take by mouth., Disp: , Rfl:    verapamil (CALAN-SR) 120 MG CR tablet, TAKE ONE TABLET AT BEDTIME, Disp: 90 tablet, Rfl: 3  Social History   Tobacco Use  Smoking Status Never   Passive exposure: Past  Smokeless Tobacco Never    Allergies  Allergen Reactions   Atenolol Other (See Comments)    Wt gain   Cefuroxime Axetil Nausea Only and Other (See Comments)    REACTION: nausea - but tolerates PCN   Codeine Sulfate Nausea And Vomiting and Nausea Only   Fentanyl Nausea And Vomiting and Other (See Comments)    REACTION: bad reaction   Imdur [Isosorbide Nitrate] Other (See Comments)    Headaches   Nitrofurantoin Other (See Comments)    Unknown   Pregabalin Other (See Comments)    REACTION: cp   Rapaflo [Silodosin] Other (See Comments)    Orthostatic BP drop, near syncope    Topamax [Topiramate]     Tight chest    Tramadol Other (See Comments)   Tramadol Hcl Nausea And Vomiting    REACTION: sick   Iodinated Contrast Media Rash   Objective:  There were no vitals filed for this visit. There is no height or weight on file to calculate BMI. Constitutional Well developed. Well nourished.  Vascular Foot warm and well perfused. Capillary refill normal to all digits.   Neurologic Normal speech. Oriented to person, place, and time. Epicritic sensation to light touch grossly present  bilaterally.  Dermatologic Skin healing well without signs of infection. Skin edges well coapted without signs of infection.  Orthopedic: Tenderness to palpation noted about the surgical site.   Radiographs: 3 views of skeletally mature the right foot status post exostectomy.  Hardware seems intact no signs of backing out or loosening noted Assessment:   1. Post-operative state   2. Hammertoe of right foot   3. Hammertoe of left foot    Plan:  Patient was evaluated and treated and all questions answered.  S/p foot surgery right -Progressing as expected post-operatively. -XR: See above -WB Status: Weightbearing as tolerated in surgical shoe -Sutures: Intact.  No clinical signs of dehiscence noted no complication noted. -Medications: Gabapentin -Foot redressed.  No follow-ups on file.

## 2022-09-24 ENCOUNTER — Ambulatory Visit (INDEPENDENT_AMBULATORY_CARE_PROVIDER_SITE_OTHER): Payer: PPO | Admitting: Podiatry

## 2022-09-24 DIAGNOSIS — Z9889 Other specified postprocedural states: Secondary | ICD-10-CM

## 2022-09-24 NOTE — Progress Notes (Signed)
Chief Complaint  Patient presents with   Routine Post Op    Subjective:  Patient presents today status post dorsal exostectomy of the right foot. DOS: 09/11/2022.  Patient states that she is doing well.  No pain.  Patient on chronic pain management.  She has been WBAT in the cam boot.  No new complaints this time  Past Medical History:  Diagnosis Date   Abnormal weight gain    Acute sinusitis 12/05/2008   1/18 8/18   Arachnoid cyst 08/19/2011   Chest tightness or pressure 05/07/2018   Constipation 09/08/2012   Chronic - opioid related 11/17 Amitiza   Contact dermatitis 03/16/2013   5/14 relapsing - poison ivy    Dyspnea 05/07/2018   Elevated liver enzymes 03/04/2011   Chronic off and on    Female stress incontinence 02/06/2009   Qualifier: Diagnosis of  By: Alain Marion MD, Evie Lacks    FEVER UNSPECIFIED 09/29/2007   Qualifier: Diagnosis of  By: Alain Marion MD, Evie Lacks    GERD 09/29/2007   Chronic  Protonix prn  Potential benefits of a long term PPI use as well as potential risks  and complications were explained to the patient and were aknowledged.     GERD (gastroesophageal reflux disease)    Headache 12/20/2007   Dr Hassell Done Migraines (1 per 2 wks) Topamax 400 mg/d; Imitrex prn; Ibuprofen prn, Zofran prn    Hyperglycemia 08/19/2011   Mild     Hypertension    Increased blood pressure (not hypertension) 08/23/2014   Intractable migraine 06/14/2009   Chronic Ibuprofen, Imitrex, Topamax,  Nortriptyline - intolerant   Knee pain 03/04/2011   R knee 2018 L knee pain - L knee b anserina and L knee is tender w/ROM: L knee pain is worse - patellofemoral syndrome vs other   LBP (low back pain)    Local anesthetic drug adverse reaction 01/21/2022   LOW BACK PAIN 09/29/2007   Chronic, lately (2016) disabling Due to syringomyelia On Oxycodone  Potential benefits of a long term opioids use as well as potential risks (i.e. addiction risk, apnea etc) and complications (i.e. Somnolence, constipation and  others) were explained to the patient and were aknowledged.     LUQ PAIN 05/23/2010   Qualifier: Diagnosis of  By: Plotnikov MD, Evie Lacks    Migraine, chronic, without aura, intractable 10/28/2017   Botox approved diagnosis. 7/19 Worse. Start Emagility inj. Relpax   Migraines    Nausea & vomiting 08/19/2011   Nausea without vomiting 06/14/2009   Due to migraines     NECK PAIN 06/14/2009    Chronic, lately (2016) disabling Due to syringomyelia On Oxycodone  Potential benefits of a long term opioids use as well as potential risks (i.e. addiction risk, apnea etc) and complications (i.e. Somnolence, constipation and others) were explained to the patient and were aknowledged.     NONSPECIFIC ABNORMAL RESULTS LIVR FUNCTION STUDY 09/28/2009   Qualifier: Diagnosis of  By: Alain Marion MD, Evie Lacks    OBSESSIVE-COMPULSIVE DISORDER 05/23/2010   Chronic - compulsive shopping    Onychomycosis 01/30/2016   4/17    Orthostatic hypotension 06/30/2013   9/14 likely Rapaflo induced    OTITIS EXTERNA 05/10/2008   Qualifier: Diagnosis of  By: Alain Marion MD, Evie Lacks    Palpitations 06/21/2014   PARESTHESIA 07/13/2008   Qualifier: Diagnosis of  By: Alain Marion MD, Evie Lacks    Partial thickness burn of left wrist 09/11/2015   Postoperative state 07/24/2016   Pyelonephritis 2008  Rash 07/27/2017   Stress 2009   Syncope, near 06/30/2013   9/14 likely Rapaflo induced 12/14 relapsing off Rapaflo x 7 2/15 no relapsing since on 1/2 dose of Topamax    Syringobulbia ([place])    SYRINGOMYELIA 08/20/2007   Chronic Dr Krista Blue Dr Jalene Mullet at [facility] Chronic pain    Syringomyelia ([place])    Tachycardia    Tonsillitis, chronic 02/26/2017   2018 worse   URINARY TRACT INFECTION (UTI) 03/23/2008   Qualifier: Diagnosis of  By: Alain Marion MD, Evie Lacks    UTI (lower urinary tract infection)    Well adult exam 12/21/2013   We discussed age appropriate health related issues, including available/recomended screening tests and vaccinations. We discussed  a need for adhering to healthy diet and exercise. Labs/EKG were reviewed/ordered. All questions were answered.       Past Surgical History:  Procedure Laterality Date   ABDOMINAL HYSTERECTOMY     ANTERIOR AND POSTERIOR REPAIR N/A 07/24/2016   Procedure: Possible ANTERIOR (CYSTOCELE) repair, perineoplasty;  Surgeon: Princess Bruins, MD;  Location: [facility];  Service: Gynecology;  Laterality: N/A;   BLADDER SUSPENSION N/A 07/24/2016   Procedure: TRANSVAGINAL TAPE (TVT) PROCEDURE;  Surgeon: Princess Bruins, MD;  Location: [facility];  Service: Gynecology;  Laterality: N/A;   COLONOSCOPY     CYSTOSCOPY N/A 07/24/2016   Procedure: CYSTOSCOPY;  Surgeon: Princess Bruins, MD;  Location: [facility] [place] [facility];  Service: Gynecology;  Laterality: N/A;   intracranial decompression surgery  2006   LEFT HEART CATH AND CORONARY ANGIOGRAPHY N/A 05/07/2018   Procedure: LEFT HEART CATH AND CORONARY ANGIOGRAPHY;  Surgeon: Belva Crome, MD;  Location: [place] [facility];  Service: Cardiovascular;  Laterality: N/A;   PARTIAL KNEE ARTHROPLASTY Left 09/07/2018   Procedure: UNICOMPARTMENTAL LEFT KNEE;  Surgeon: Renette Butters, MD;  Location: [facility];  Service: Orthopedics;  Laterality: Left;  Adductor Block   ROBOTIC ASSISTED LAPAROSCOPIC SACROCOLPOPEXY N/A 07/24/2016   Procedure: ROBOTIC ASSISTED LAPAROSCOPIC SACROCOLPOPEXY WITH PERINEOPLASTY;  Surgeon: Princess Bruins, MD;  Location: [facility];  Service: Gynecology;  Laterality: N/A;   TUBAL LIGATION      Allergies  Allergen Reactions   Atenolol Other (See Comments)    Wt gain   Cefuroxime Axetil Nausea Only and Other (See Comments)    REACTION: nausea - but tolerates PCN   Codeine Sulfate Nausea And Vomiting and Nausea Only   Fentanyl Nausea And Vomiting and Other (See Comments)    REACTION: bad reaction   Imdur [Isosorbide Nitrate] Other (See Comments)    Headaches   Nitrofurantoin Other (See Comments)    Unknown   Pregabalin Other (See Comments)    REACTION: cp    Rapaflo [Silodosin] Other (See Comments)    Orthostatic BP drop, near syncope    Topamax [Topiramate]     Tight chest    Tramadol Other (See Comments)   Tramadol Hcl Nausea And Vomiting    REACTION: sick   Iodinated Contrast Media Rash    Objective/Physical Exam Neurovascular status intact.  Skin incisions appear to be well coapted with sutures intact. No sign of infectious process noted. No dehiscence.  Overall well-healing surgical foot  Assessment: 1. s/p exostectomy right foot. DOS: 09/11/2022   Plan of Care:  1. Patient was evaluated.  2.  Sutures removed 3.  Patient may discontinue cam boot.  Transition into good supportive tennis shoes and sneakers 4.  Slowly increase to full activity no restrictions 5.  Return to clinic as needed  Edrick Kins,  DPM [facility]  Dr. Edrick Kins, DPM    [address], [place] [address]                Office [phone]  Fax [phone]

## 2022-10-06 ENCOUNTER — Other Ambulatory Visit: Payer: Self-pay | Admitting: Internal Medicine

## 2022-10-06 ENCOUNTER — Encounter: Payer: Self-pay | Admitting: Podiatry

## 2022-10-08 ENCOUNTER — Encounter: Payer: PPO | Admitting: Podiatry

## 2022-10-13 ENCOUNTER — Encounter: Payer: PPO | Admitting: Podiatry

## 2022-10-17 ENCOUNTER — Other Ambulatory Visit: Payer: Self-pay | Admitting: Internal Medicine

## 2022-10-24 ENCOUNTER — Other Ambulatory Visit: Payer: Self-pay | Admitting: Neurology

## 2022-10-28 ENCOUNTER — Ambulatory Visit (INDEPENDENT_AMBULATORY_CARE_PROVIDER_SITE_OTHER): Payer: PPO

## 2022-10-28 ENCOUNTER — Ambulatory Visit (INDEPENDENT_AMBULATORY_CARE_PROVIDER_SITE_OTHER): Payer: BLUE CROSS/BLUE SHIELD | Admitting: Podiatry

## 2022-10-28 DIAGNOSIS — Z9889 Other specified postprocedural states: Secondary | ICD-10-CM

## 2022-10-28 NOTE — Progress Notes (Signed)
Chief Complaint  Patient presents with   Follow-up    POV # 3 DOS 09/11/22 --- EXOSTECTOMY RIGHT FOOT    Subjective:  Patient presents today status post dorsal exostectomy of the right foot. DOS: 09/11/2022.  Patient states that she is doing well however she developed a symptomatic lesion to the dorsal aspect of the foot along the most distal portion of the incision site.  She says it is very tender and painful.  Presenting for further treatment and evaluation  Past Medical History:  Diagnosis Date   Abnormal weight gain    Acute sinusitis 12/05/2008   1/18 8/18   Arachnoid cyst 08/19/2011   Chest tightness or pressure 05/07/2018   Constipation 09/08/2012   Chronic - opioid related 11/17 Amitiza   Contact dermatitis 03/16/2013   5/14 relapsing - poison ivy    Dyspnea 05/07/2018   Elevated liver enzymes 03/04/2011   Chronic off and on    Female stress incontinence 02/06/2009   Qualifier: Diagnosis of  By: Plotnikov MD, Evie Lacks    FEVER UNSPECIFIED 09/29/2007   Qualifier: Diagnosis of  By: Alain Marion MD, Evie Lacks    GERD 09/29/2007   Chronic  Protonix prn  Potential benefits of a long term PPI use as well as potential risks  and complications were explained to the patient and were aknowledged.     GERD (gastroesophageal reflux disease)    Headache 12/20/2007   Dr Hassell Done Migraines (1 per 2 wks) Topamax 400 mg/d; Imitrex prn; Ibuprofen prn, Zofran prn    Hyperglycemia 08/19/2011   Mild     Hypertension    Increased blood pressure (not hypertension) 08/23/2014   Intractable migraine 06/14/2009   Chronic Ibuprofen, Imitrex, Topamax,  Nortriptyline - intolerant   Knee pain 03/04/2011   R knee 2018 L knee pain - L knee b anserina and L knee is tender w/ROM: L knee pain is worse - patellofemoral syndrome vs other   LBP (low back pain)    Local anesthetic drug adverse reaction 01/21/2022   LOW BACK PAIN 09/29/2007   Chronic, lately (2016) disabling Due to syringomyelia On Oxycodone  Potential  benefits of a long term opioids use as well as potential risks (i.e. addiction risk, apnea etc) and complications (i.e. Somnolence, constipation and others) were explained to the patient and were aknowledged.     LUQ PAIN 05/23/2010   Qualifier: Diagnosis of  By: Plotnikov MD, Evie Lacks    Migraine, chronic, without aura, intractable 10/28/2017   Botox approved diagnosis. 7/19 Worse. Start Emagility inj. Relpax   Migraines    Nausea & vomiting 08/19/2011   Nausea without vomiting 06/14/2009   Due to migraines     NECK PAIN 06/14/2009    Chronic, lately (2016) disabling Due to syringomyelia On Oxycodone  Potential benefits of a long term opioids use as well as potential risks (i.e. addiction risk, apnea etc) and complications (i.e. Somnolence, constipation and others) were explained to the patient and were aknowledged.     NONSPECIFIC ABNORMAL RESULTS LIVR FUNCTION STUDY 09/28/2009   Qualifier: Diagnosis of  By: Alain Marion MD, Evie Lacks    OBSESSIVE-COMPULSIVE DISORDER 05/23/2010   Chronic - compulsive shopping    Onychomycosis 01/30/2016   4/17    Orthostatic hypotension 06/30/2013   9/14 likely Rapaflo induced    OTITIS EXTERNA 05/10/2008   Qualifier: Diagnosis of  By: Alain Marion MD, Evie Lacks    Palpitations 06/21/2014   PARESTHESIA 07/13/2008   Qualifier: Diagnosis of  By:  Plotnikov MD, Tyrone Apple V    Partial thickness burn of left wrist 09/11/2015   Postoperative state 07/24/2016   Pyelonephritis 2008   Rash 07/27/2017   Stress 2009   Syncope, near 06/30/2013   9/14 likely Rapaflo induced 12/14 relapsing off Rapaflo x 7 2/15 no relapsing since on 1/2 dose of Topamax    Syringobulbia ([place])    SYRINGOMYELIA 08/20/2007   Chronic Dr Krista Blue Dr Jalene Mullet at [facility] Chronic pain    Syringomyelia ([place])    Tachycardia    Tonsillitis, chronic 02/26/2017   2018 worse   URINARY TRACT INFECTION (UTI) 03/23/2008   Qualifier: Diagnosis of  By: Alain Marion MD, Evie Lacks    UTI (lower urinary tract infection)    Well  adult exam 12/21/2013   We discussed age appropriate health related issues, including available/recomended screening tests and vaccinations. We discussed a need for adhering to healthy diet and exercise. Labs/EKG were reviewed/ordered. All questions were answered.       Past Surgical History:  Procedure Laterality Date   ABDOMINAL HYSTERECTOMY     ANTERIOR AND POSTERIOR REPAIR N/A 07/24/2016   Procedure: Possible ANTERIOR (CYSTOCELE) repair, perineoplasty;  Surgeon: Princess Bruins, MD;  Location: [facility];  Service: Gynecology;  Laterality: N/A;   BLADDER SUSPENSION N/A 07/24/2016   Procedure: TRANSVAGINAL TAPE (TVT) PROCEDURE;  Surgeon: Princess Bruins, MD;  Location: [facility];  Service: Gynecology;  Laterality: N/A;   COLONOSCOPY     CYSTOSCOPY N/A 07/24/2016   Procedure: CYSTOSCOPY;  Surgeon: Princess Bruins, MD;  Location: [facility];  Service: Gynecology;  Laterality: N/A;   intracranial decompression surgery  2006   LEFT HEART CATH AND CORONARY ANGIOGRAPHY N/A 05/07/2018   Procedure: LEFT HEART CATH AND CORONARY ANGIOGRAPHY;  Surgeon: Belva Crome, MD;  Location: [place] [facility];  Service: Cardiovascular;  Laterality: N/A;   PARTIAL KNEE ARTHROPLASTY Left 09/07/2018   Procedure: UNICOMPARTMENTAL LEFT KNEE;  Surgeon: Renette Butters, MD;  Location: [facility];  Service: Orthopedics;  Laterality: Left;  Adductor Block   ROBOTIC ASSISTED LAPAROSCOPIC SACROCOLPOPEXY N/A 07/24/2016   Procedure: ROBOTIC ASSISTED LAPAROSCOPIC SACROCOLPOPEXY WITH PERINEOPLASTY;  Surgeon: Princess Bruins, MD;  Location: [facility];  Service: Gynecology;  Laterality: N/A;   TUBAL LIGATION      Allergies  Allergen Reactions   Atenolol Other (See Comments)    Wt gain   Cefuroxime Axetil Nausea Only and Other (See Comments)    REACTION: nausea - but tolerates PCN   Codeine Sulfate Nausea And Vomiting and Nausea Only   Fentanyl Nausea And Vomiting and Other (See Comments)    REACTION: bad reaction   Imdur  [Isosorbide Nitrate] Other (See Comments)    Headaches   Nitrofurantoin Other (See Comments)    Unknown   Pregabalin Other (See Comments)    REACTION: cp   Rapaflo [Silodosin] Other (See Comments)    Orthostatic BP drop, near syncope    Topamax [Topiramate]     Tight chest    Tramadol Other (See Comments)   Tramadol Hcl Nausea And Vomiting    REACTION: sick   Iodinated Contrast Media Rash         Objective/Physical Exam Neurovascular status intact.  Skin incisions appear to be well coapted with sutures intact.  Not adhered malleable lesion noted along the distalmost aspect of the incision site possibly concerning for foreign body granuloma secondary to suture.  Assessment: 1. s/p exostectomy right foot. DOS: 09/11/2022 2.  Foreign body granuloma secondary to absorbable suture right foot  Plan of Care:  1. Patient was evaluated.  2.  The area was carefully inspected and a tissue nipper was utilized to probe the area and an absorbable Vicryl suture was removed from the area.  Please see above noted photo lesion likely foreign body granuloma secondary to suture 3.  Will observe over the next few weeks to see if the lesion calms down and resolves 4.  Return to clinic as needed, if it continues to be painful we may need to proceed with excision of the benign lesion performed here in the office procedure room  Edrick Kins, DPM [facility]  Dr. Edrick Kins, DPM    [address], [place] [address]                Office [phone]  Fax [phone]

## 2022-11-07 ENCOUNTER — Other Ambulatory Visit: Payer: Self-pay | Admitting: Obstetrics & Gynecology

## 2022-11-07 DIAGNOSIS — N3946 Mixed incontinence: Secondary | ICD-10-CM | POA: Diagnosis not present

## 2022-11-07 DIAGNOSIS — N319 Neuromuscular dysfunction of bladder, unspecified: Secondary | ICD-10-CM | POA: Diagnosis not present

## 2022-11-07 DIAGNOSIS — Z1231 Encounter for screening mammogram for malignant neoplasm of breast: Secondary | ICD-10-CM

## 2022-11-12 ENCOUNTER — Encounter: Payer: Self-pay | Admitting: Internal Medicine

## 2022-11-12 ENCOUNTER — Telehealth: Payer: Self-pay

## 2022-11-12 NOTE — Telephone Encounter (Signed)
Error

## 2022-11-13 ENCOUNTER — Other Ambulatory Visit: Payer: Self-pay | Admitting: Internal Medicine

## 2022-11-13 MED ORDER — OXYCODONE HCL 20 MG PO TABS: 10.0000 mg | ORAL_TABLET | Freq: Four times a day (QID) | ORAL | 0 refills | Status: DC | PRN

## 2022-11-19 ENCOUNTER — Encounter: Payer: Self-pay | Admitting: Internal Medicine

## 2022-11-19 ENCOUNTER — Ambulatory Visit (INDEPENDENT_AMBULATORY_CARE_PROVIDER_SITE_OTHER): Payer: Medicare Other | Admitting: Internal Medicine

## 2022-11-19 VITALS — BP 120/70 | HR 80 | Temp 98.2°F | Ht 64.0 in | Wt 155.0 lb

## 2022-11-19 DIAGNOSIS — M5442 Lumbago with sciatica, left side: Secondary | ICD-10-CM | POA: Diagnosis not present

## 2022-11-19 DIAGNOSIS — G43719 Chronic migraine without aura, intractable, without status migrainosus: Secondary | ICD-10-CM

## 2022-11-19 DIAGNOSIS — G950 Syringomyelia and syringobulbia: Secondary | ICD-10-CM

## 2022-11-19 DIAGNOSIS — M5441 Lumbago with sciatica, right side: Secondary | ICD-10-CM | POA: Diagnosis not present

## 2022-11-19 DIAGNOSIS — G8929 Other chronic pain: Secondary | ICD-10-CM

## 2022-11-19 MED ORDER — OXYCODONE HCL 20 MG PO TABS: 10.0000 mg | ORAL_TABLET | Freq: Four times a day (QID) | ORAL | 0 refills | Status: DC | PRN

## 2022-11-19 NOTE — Assessment & Plan Note (Signed)
Due to syringomyelia On Oxycodone  Potential benefits of a long term opioids use as well as potential risks (i.e. addiction risk, apnea etc) and complications (i.e. Somnolence, constipation and others) were explained to the patient and were aknowledged. 

## 2022-11-19 NOTE — Progress Notes (Signed)
Subjective:  Patient ID: Beth Huffman, female    DOB: 1960/12/11  Age: 62 y.o. MRN: [id number]  CC: No chief complaint on file.   HPI Beth Huffman presents for chronic pain, HAs, recent foot surgery Dr Posey Pronto gave Gabapentin for foot pain No SOB as much any more since it is cooler  Outpatient Medications Prior to Visit  Medication Sig Dispense Refill   AIMOVIG 140 MG/ML SOAJ Inject 140 mg into the skin every 28 (twenty-eight) days. 1.12 mL 5   albuterol (VENTOLIN HFA) 108 (90 Base) MCG/ACT inhaler Inhale 2 puffs into the lungs every 6 (six) hours as needed for wheezing or shortness of breath. 18 g 2   azelastine (OPTIVAR) 0.05 % ophthalmic solution Apply 1 drop to eye 2 (two) times daily.     cholecalciferol (VITAMIN D) 1000 UNITS tablet Take 1,000 Units by mouth daily.     cyclobenzaprine (FLEXERIL) 5 MG tablet TAKE 1 TABLET THREE TIMES DAILY AS NEEDED FOR MUSCLE SPASMS. 90 tablet 3   estradiol (ESTRACE) 0.5 MG tablet Take 1 tablet (0.5 mg total) by mouth daily. 90 tablet 4   famotidine (PEPCID) 40 MG tablet Take 1 tablet (40 mg total) by mouth daily. 90 tablet 3   Multiple Vitamin (MULTIVITAMIN PO) Take 1 tablet by mouth daily.     naproxen (NAPROSYN) 500 MG tablet TAKE 1 TABLET BY MOUTH TWICE DAILY WITH A MEAL. 60 tablet 3   pantoprazole (PROTONIX) 40 MG tablet Take 1 tablet (40 mg total) by mouth at bedtime. 90 tablet 3   polyethylene glycol (MIRALAX / GLYCOLAX) packet Take 8.5 g by mouth every other day.     rizatriptan (MAXALT) 10 MG tablet TAKE 1 TABLET AT ONSET OF MIGRAINE- MAY REPEAT ONCE IN 2 HOURS. LIMIT 2/24 HOURS. AVOID DAILY USE. 8 tablet 3   senna (SENOKOT) 8.6 MG tablet Take 1 tablet by mouth as needed for constipation.     Turmeric (QC TUMERIC COMPLEX) 500 MG CAPS Take by mouth.     UBRELVY 100 MG TABS Take 100 mg by mouth daily as needed. 16 tablet 5   verapamil (CALAN-SR) 120 MG CR tablet TAKE ONE TABLET AT BEDTIME 90 tablet 3   Oxycodone HCl 20 MG TABS Take  0.5 tablets (10 mg total) by mouth 4 (four) times daily as needed. 60 tablet 0   gabapentin (NEURONTIN) 100 MG capsule Take 1 capsule (100 mg total) by mouth 3 (three) times daily. 90 capsule 3   gabapentin (NEURONTIN) 100 MG capsule Take 1 capsule (100 mg total) by mouth 3 (three) times daily. 90 capsule 0   Oxycodone HCl 20 MG TABS Take 0.5 tablets (10 mg total) by mouth 4 (four) times daily as needed. 60 tablet 0   Oxycodone HCl 20 MG TABS Take 0.5 tablets (10 mg total) by mouth 4 (four) times daily as needed. 60 tablet 0   No facility-administered medications prior to visit.    ROS: Review of Systems  Constitutional:  Negative for activity change, appetite change, chills, fatigue and unexpected weight change.  HENT:  Negative for congestion, mouth sores and sinus pressure.   Eyes:  Negative for visual disturbance.  Respiratory:  Negative for cough and chest tightness.   Gastrointestinal:  Negative for abdominal pain and nausea.  Genitourinary:  Negative for difficulty urinating, frequency and vaginal pain.  Musculoskeletal:  Negative for back pain and gait problem.  Skin:  Negative for pallor and rash.  Neurological:  Negative for dizziness, tremors,  weakness, numbness and headaches.  Psychiatric/Behavioral:  Negative for confusion and sleep disturbance.     Objective:  BP 120/70 (BP Location: Left Arm, Patient Position: Sitting, Cuff Size: Normal)   Pulse 80   Temp 98.2 F (36.8 C) (Oral)   Ht '5\' 4"'$  (1.626 m)   Wt 155 lb (70.3 kg)   SpO2 95%   BMI 26.61 kg/m   BP Readings from Last 3 Encounters:  11/19/22 120/70  08/19/22 134/82  06/09/22 112/78    Wt Readings from Last 3 Encounters:  11/19/22 155 lb (70.3 kg)  08/19/22 148 lb (67.1 kg)  06/09/22 147 lb (66.7 kg)    Physical Exam Constitutional:      General: She is not in acute distress.    Appearance: Normal appearance. She is well-developed.  HENT:     Head: Normocephalic.     Right Ear: External ear  normal.     Left Ear: External ear normal.     Nose: Nose normal.  Eyes:     General:        Right eye: No discharge.        Left eye: No discharge.     Conjunctiva/sclera: Conjunctivae normal.     Pupils: Pupils are equal, round, and reactive to light.  Neck:     Thyroid: No thyromegaly.     Vascular: No JVD.     Trachea: No tracheal deviation.  Cardiovascular:     Rate and Rhythm: Normal rate and regular rhythm.     Heart sounds: Normal heart sounds.  Pulmonary:     Effort: No respiratory distress.     Breath sounds: No stridor. No wheezing.  Abdominal:     General: Bowel sounds are normal. There is no distension.     Palpations: Abdomen is soft. There is no mass.     Tenderness: There is no abdominal tenderness. There is no guarding or rebound.  Musculoskeletal:        General: No tenderness.     Cervical back: Normal range of motion and neck supple. No rigidity.  Lymphadenopathy:     Cervical: No cervical adenopathy.  Skin:    Findings: No erythema or rash.  Neurological:     Cranial Nerves: No cranial nerve deficit.     Motor: No abnormal muscle tone.     Coordination: Coordination normal.     Deep Tendon Reflexes: Reflexes normal.  Psychiatric:        Behavior: Behavior normal.        Thought Content: Thought content normal.        Judgment: Judgment normal.     Lab Results  Component Value Date   WBC 5.2 04/25/2021   HGB 13.3 04/25/2021   HCT 39.6 04/25/2021   PLT 210.0 04/25/2021   GLUCOSE 84 04/04/2022   CHOL 225 (H) 04/25/2021   TRIG 110.0 04/25/2021   HDL 70.00 04/25/2021   LDLDIRECT 130.2 12/21/2013   LDLCALC 133 (H) 04/25/2021   ALT 27 04/25/2021   AST 25 04/25/2021   NA 138 04/04/2022   K 4.6 04/04/2022   CL 102 04/04/2022   CREATININE 0.79 04/04/2022   BUN 12 04/04/2022   CO2 26 04/04/2022   TSH 1.81 04/25/2021   HGBA1C 5.5 03/31/2018    MR Thoracic Spine Wo Contrast  Result Date: 05/08/2022 CLINICAL DATA:  Syringomyelia EXAM: MRI  THORACIC SPINE WITHOUT CONTRAST TECHNIQUE: Multiplanar, multisequence MR imaging of the thoracic spine was performed. No intravenous contrast was administered. COMPARISON:  MRI 04/18/2008 FINDINGS: Alignment:  Normal thoracic alignment. Vertebrae: No fracture or focal bone lesion. Cord: The cord is normal from T1 to the level of the T3-4 disc. There is dilatation of the central canal or a thoracic cord syrinx extending from the T3-4 disc level as far as the T11-12 disc level. Largest diameter is at the lower T4 level measuring 3 mm and at the T10 level measuring 3.6 mm. This is actually smaller than was seen in 2009 when the diameter at lower T4 measured 3.2 mm and at T10 with a maximal diameter was also 3.6 mm but there was a fuller/more rounded configuration. Paraspinal and other soft tissues: Otherwise normal. Few incidental root sleeve cysts. Disc levels: No compressive disc pathology. Minimal midthoracic disc bulges but wide patency of the canal and foramina. IMPRESSION: No progressive or worsening finding compared to the study of 2009. Thoracic syrinx extending from the level of the T3-4 disc to the T11-12 disc level. The syrinx is actually slightly smaller when compared to the prior study, maximal transverse diameter at T4 measuring 3 mm today compared with 3.2 mm previously and with slight reduction in transverse diameter in the lower thoracic region, though the maximal measurement at the T10 level remains 3.6 mm. Electronically Signed   By: Nelson Chimes M.D.   On: 05/08/2022 15:55   MR Cervical Spine Wo Contrast  Result Date: 05/08/2022 CLINICAL DATA:  Syringomyelia.  Prior brain surgery. EXAM: MRI CERVICAL SPINE WITHOUT CONTRAST TECHNIQUE: Multiplanar, multisequence MR imaging of the cervical spine was performed. No intravenous contrast was administered. COMPARISON:  04/18/2008 partial examination FINDINGS: Alignment: Normal Vertebrae: Normal Cord: As described previously, there is mild prominence of  the central canal from the upper C6 vertebral body level to the C7-T1 disc level. Maximal transverse diameter is 2 mm, no larger than what was described in 2009. Otherwise the cord signal is normal. 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 Disc levels: No significant disc level pathology. No disc herniation or stenosis of the canal or foramina. The patient does have facet osteoarthritis on the right at C3-4 and C4-5 with small synovial cysts but no compressive effect upon the neural structures. IMPRESSION: Prominence of the central canal from upper C6 to the level of the C7-T1 disc, maximal transverse diameter 2 mm, the same as was described in 2009. No compressive pathology of the canal or foramina. Mild facet osteoarthritis on the right at C3-4 and C4-5. Electronically Signed   By: Nelson Chimes M.D.   On: 05/08/2022 15:48   MR Brain Wo Contrast  Result Date: 05/08/2022 CLINICAL DATA:  Syringomyelia or syringobulbia. Headaches. History of brain surgery. EXAM: MRI HEAD WITHOUT CONTRAST TECHNIQUE: Multiplanar, multiecho pulse sequences of the brain and surrounding structures were obtained without intravenous contrast. COMPARISON:  Head CT 08/14/2011.  MRI 05/20/2007. FINDINGS: Brain: Distant previous suboccipital craniotomy. Mega cisterna magna is unchanged since the study of 2008. No mass effect. The obex is normal. No focal abnormality otherwise affecting the brainstem or cerebellum. Cerebral hemispheres are again normal with a single punctate focus of T2 and FLAIR signal in the right temporal lobe, not significant. No mass lesion, hemorrhage, hydrocephalus or extra-axial collection. Vascular: Major vessels at the base of the brain show flow. Skull and upper cervical spine: Negative Sinuses/Orbits: Clear/normal Other: None IMPRESSION: Distant suboccipital craniotomy. Mega cisterna magna, unchanged in appearance compared to the examination of  05/20/2007. Otherwise normal appearance of the brain with exception of  a punctate focus of T2 and FLAIR signal in the right temporal lobe, unchanged since 2008, not significant. Electronically Signed   By: Nelson Chimes M.D.   On: 05/08/2022 15:43    Assessment & Plan:   Problem List Items Addressed This Visit       Cardiovascular and Mediastinum   Migraine, chronic, without aura, intractable - Primary (Chronic)    Ubrelvi On verapamil, trazodone Maxalt as needed Better on added Gabapentin      Relevant Medications   Oxycodone HCl 20 MG TABS   Oxycodone HCl 20 MG TABS   Oxycodone HCl 20 MG TABS     Nervous and Auditory   SYRINGOMYELIA    No SOB as much any more since it is cooler        Other   LOW BACK PAIN    Due to syringomyelia On Oxycodone  Potential benefits of a long term opioids use as well as potential risks (i.e. addiction risk, apnea etc) and complications (i.e. Somnolence, constipation and others) were explained to the patient and were aknowledged.      Relevant Medications   Oxycodone HCl 20 MG TABS   Oxycodone HCl 20 MG TABS   Oxycodone HCl 20 MG TABS      Meds ordered this encounter  Medications   Oxycodone HCl 20 MG TABS    Sig: Take 0.5 tablets (10 mg total) by mouth 4 (four) times daily as needed.    Dispense:  60 tablet    Refill:  0    Please fill on or after 12/16/22   Oxycodone HCl 20 MG TABS    Sig: Take 0.5 tablets (10 mg total) by mouth 4 (four) times daily as needed.    Dispense:  60 tablet    Refill:  0    Please fill on or after 01/15/23   Oxycodone HCl 20 MG TABS    Sig: Take 0.5 tablets (10 mg total) by mouth 4 (four) times daily as needed.    Dispense:  60 tablet    Refill:  0    Please fill on or after 02/14/23      Follow-up: Return in about 3 months (around 02/18/2023) for a follow-up visit.  Walker Kehr, MD

## 2022-11-19 NOTE — Assessment & Plan Note (Signed)
Ubrelvi On verapamil, trazodone Maxalt as needed Better on added Gabapentin

## 2022-11-19 NOTE — Assessment & Plan Note (Signed)
No SOB as much any more since it is cooler

## 2022-11-24 ENCOUNTER — Other Ambulatory Visit (HOSPITAL_COMMUNITY): Payer: Self-pay

## 2022-11-26 ENCOUNTER — Other Ambulatory Visit: Payer: Self-pay | Admitting: Internal Medicine

## 2022-11-27 DIAGNOSIS — N3946 Mixed incontinence: Secondary | ICD-10-CM | POA: Diagnosis not present

## 2022-12-04 ENCOUNTER — Telehealth: Payer: Self-pay

## 2022-12-04 NOTE — Telephone Encounter (Signed)
Letter recevied from [facility], [place] needed for aimovig 140 mg

## 2022-12-05 ENCOUNTER — Encounter: Payer: Self-pay | Admitting: Internal Medicine

## 2022-12-09 ENCOUNTER — Telehealth (INDEPENDENT_AMBULATORY_CARE_PROVIDER_SITE_OTHER): Payer: Medicare Other | Admitting: Family Medicine

## 2022-12-09 ENCOUNTER — Encounter: Payer: Self-pay | Admitting: Family Medicine

## 2022-12-09 DIAGNOSIS — H9201 Otalgia, right ear: Secondary | ICD-10-CM

## 2022-12-09 MED ORDER — AZITHROMYCIN 250 MG PO TABS: ORAL_TABLET | ORAL | 0 refills | Status: DC

## 2022-12-09 NOTE — Patient Instructions (Signed)
-  I sent the medication(s) we discussed to your pharmacy: Meds ordered this encounter  Medications   azithromycin (ZITHROMAX) 250 MG tablet    Sig: 2 tabs day 1, then one tab daily    Dispense:  6 tablet    Refill:  0     I hope you are feeling better soon!  Seek in person care promptly if your symptoms worsen, new concerns arise or you are not improving with treatment.  It was nice to meet you today. I help [facility] out with telemedicine visits on Tuesdays and Thursdays and am happy to help if you need a virtual follow up visit on those days. Otherwise, if you have any concerns or questions following this visit please schedule a follow up visit with your Primary Care office or seek care at a local urgent care clinic to avoid delays in care. If you are having severe or life threatening symptoms please call 911 and/or go to the nearest emergency room.

## 2022-12-09 NOTE — Progress Notes (Signed)
Virtual Visit via Video Note  I connected with Beth Huffman  on 12/09/22 at  5:40 PM EST by a video enabled telemedicine application and verified that I am speaking with the correct person using two identifiers.  Location patient: [place] Location provider:work or home office Persons participating in the virtual visit: patient, provider  I discussed the limitations and requested verbal permission for telemedicine visit. The patient expressed understanding and agreed to proceed.   HPI:  Acute telemedicine visit for Ear Issue: -Onset: started w/ nasal congestion w/ and a sinus cold and now R ear discomfort for almost 1 week, feels full and uncomfortable, sometimes popping and hears noise in the ear - non pulsatile, reduce hearing in this ear - but can still hear -Symptoms include: no discharge or blood from the ear, No dizziness unless stands up to suddenly, fever, sore throat, HA -she reports she has had OM in the past, this is the time of the year she usually gets it and this feels the same - reports despite listed allergies has taken zpacks, amoxicillin just fine, reports the cefuroxine just upset her stomach which many meds do, denies allergy -Denies:hearing loss, fever, CP, SOB, dizziness other than above -Pertinent past medical history: see below, she has allergies -Pertinent medication allergies: Allergies  Allergen Reactions   Atenolol Other (See Comments)    Wt gain   Cefuroxime Axetil Nausea Only and Other (See Comments)    REACTION: nausea - but tolerates PCN   Codeine Sulfate Nausea And Vomiting and Nausea Only   Fentanyl Nausea And Vomiting and Other (See Comments)    REACTION: bad reaction   Imdur [Isosorbide Nitrate] Other (See Comments)    Headaches   Nitrofurantoin Other (See Comments)    Unknown   Pregabalin Other (See Comments)    REACTION: cp   Rapaflo [Silodosin] Other (See Comments)    Orthostatic BP drop, near syncope    Topamax [Topiramate]     Tight chest     Tramadol Other (See Comments)   Tramadol Hcl Nausea And Vomiting    REACTION: sick   Iodinated Contrast Media Rash   -COVID-19 vaccine status:  Immunization History  Administered Date(s) Administered   Influenza, High Dose Seasonal PF 12/12/2021   Influenza,inj,Quad PF,6+ Mos 09/14/2013, 06/27/2015, 07/25/2016, 07/27/2017, 08/03/2018, 07/29/2019, 07/31/2020, 07/31/2021, 08/19/2022   Influenza-Unspecified 09/13/2014   Moderna Sars-Covid-2 Vaccination 01/04/2020, 01/25/2020   PFIZER(Purple Top)SARS-COV-2 Vaccination 08/19/2020, 12/12/2021   Pfizer Covid-19 Vaccine Bivalent Booster 56yr & up 07/15/2021   Pneumococcal Polysaccharide-23 07/25/2016   Tdap 09/28/2015   Zoster Recombinat (Shingrix) 09/04/2021, 11/18/2021     ROS: See pertinent positives and negatives per HPI.  Past Medical History:  Diagnosis Date   Abnormal weight gain    Acute sinusitis 12/05/2008   1/18 8/18   Arachnoid cyst 08/19/2011   Chest tightness or pressure 05/07/2018   Constipation 09/08/2012   Chronic - opioid related 11/17 Amitiza   Contact dermatitis 03/16/2013   5/14 relapsing - poison ivy    Dyspnea 05/07/2018   Elevated liver enzymes 03/04/2011   Chronic off and on    Female stress incontinence 02/06/2009   Qualifier: Diagnosis of  By: PAlain MarionMD, AEvie Lacks   FEVER UNSPECIFIED 09/29/2007   Qualifier: Diagnosis of  By: PAlain MarionMD, AEvie Lacks   GERD 09/29/2007   Chronic  Protonix prn  Potential benefits of a long term PPI use as well as potential risks  and complications were explained to the patient and were aknowledged.  GERD (gastroesophageal reflux disease)    Headache 12/20/2007   Dr Hassell Done Migraines (1 per 2 wks) Topamax 400 mg/d; Imitrex prn; Ibuprofen prn, Zofran prn    Hyperglycemia 08/19/2011   Mild     Hypertension    Increased blood pressure (not hypertension) 08/23/2014   Intractable migraine 06/14/2009   Chronic Ibuprofen, Imitrex, Topamax,  Nortriptyline - intolerant   Knee pain  03/04/2011   R knee 2018 L knee pain - L knee b anserina and L knee is tender w/ROM: L knee pain is worse - patellofemoral syndrome vs other   LBP (low back pain)    Local anesthetic drug adverse reaction 01/21/2022   LOW BACK PAIN 09/29/2007   Chronic, lately (2016) disabling Due to syringomyelia On Oxycodone  Potential benefits of a long term opioids use as well as potential risks (i.e. addiction risk, apnea etc) and complications (i.e. Somnolence, constipation and others) were explained to the patient and were aknowledged.     LUQ PAIN 05/23/2010   Qualifier: Diagnosis of  By: Plotnikov MD, Evie Lacks    Migraine, chronic, without aura, intractable 10/28/2017   Botox approved diagnosis. 7/19 Worse. Start Emagility inj. Relpax   Migraines    Nausea & vomiting 08/19/2011   Nausea without vomiting 06/14/2009   Due to migraines     NECK PAIN 06/14/2009    Chronic, lately (2016) disabling Due to syringomyelia On Oxycodone  Potential benefits of a long term opioids use as well as potential risks (i.e. addiction risk, apnea etc) and complications (i.e. Somnolence, constipation and others) were explained to the patient and were aknowledged.     NONSPECIFIC ABNORMAL RESULTS LIVR FUNCTION STUDY 09/28/2009   Qualifier: Diagnosis of  By: Alain Marion MD, Evie Lacks    OBSESSIVE-COMPULSIVE DISORDER 05/23/2010   Chronic - compulsive shopping    Onychomycosis 01/30/2016   4/17    Orthostatic hypotension 06/30/2013   9/14 likely Rapaflo induced    OTITIS EXTERNA 05/10/2008   Qualifier: Diagnosis of  By: Alain Marion MD, Evie Lacks    Palpitations 06/21/2014   PARESTHESIA 07/13/2008   Qualifier: Diagnosis of  By: Alain Marion MD, Evie Lacks    Partial thickness burn of left wrist 09/11/2015   Postoperative state 07/24/2016   Pyelonephritis 2008   Rash 07/27/2017   Stress 2009   Syncope, near 06/30/2013   9/14 likely Rapaflo induced 12/14 relapsing off Rapaflo x 7 2/15 no relapsing since on 1/2 dose of Topamax    Syringobulbia  ([place])    SYRINGOMYELIA 08/20/2007   Chronic Dr Krista Blue Dr [facility] Chronic pain    Syringomyelia ([place])    Tachycardia    Tonsillitis, chronic 02/26/2017   2018 worse   URINARY TRACT INFECTION (UTI) 03/23/2008   Qualifier: Diagnosis of  By: Alain Marion MD, Evie Lacks    UTI (lower urinary tract infection)    Well adult exam 12/21/2013   We discussed age appropriate health related issues, including available/recomended screening tests and vaccinations. We discussed a need for adhering to healthy diet and exercise. Labs/EKG were reviewed/ordered. All questions were answered.       Past Surgical History:  Procedure Laterality Date   ABDOMINAL HYSTERECTOMY     ANTERIOR AND POSTERIOR REPAIR N/A 07/24/2016   Procedure: Possible ANTERIOR (CYSTOCELE) repair, perineoplasty;  Surgeon: Princess Bruins, MD;  Location: [facility];  Service: Gynecology;  Laterality: N/A;   BLADDER SUSPENSION N/A 07/24/2016   Procedure: TRANSVAGINAL TAPE (TVT) PROCEDURE;  Surgeon: Princess Bruins, MD;  Location: [facility];  Service: Gynecology;  Laterality: N/A;   COLONOSCOPY     CYSTOSCOPY N/A 07/24/2016   Procedure: CYSTOSCOPY;  Surgeon: Princess Bruins, MD;  Location: [facility];  Service: Gynecology;  Laterality: N/A;   intracranial decompression surgery  2006   LEFT HEART CATH AND CORONARY ANGIOGRAPHY N/A 05/07/2018   Procedure: LEFT HEART CATH AND CORONARY ANGIOGRAPHY;  Surgeon: Belva Crome, MD;  Location: [facility];  Service: Cardiovascular;  Laterality: N/A;   PARTIAL KNEE ARTHROPLASTY Left 09/07/2018   Procedure: UNICOMPARTMENTAL LEFT KNEE;  Surgeon: Renette Butters, MD;  Location: [facility];  Service: Orthopedics;  Laterality: Left;  Adductor Block   ROBOTIC ASSISTED LAPAROSCOPIC SACROCOLPOPEXY N/A 07/24/2016   Procedure: ROBOTIC ASSISTED LAPAROSCOPIC SACROCOLPOPEXY WITH PERINEOPLASTY;  Surgeon: Princess Bruins, MD;  Location: [facility];  Service: Gynecology;  Laterality: N/A;   TUBAL LIGATION       Current  Outpatient Medications:    AIMOVIG 140 MG/ML SOAJ, Inject 140 mg into the skin every 28 (twenty-eight) days., Disp: 1.12 mL, Rfl: 5   albuterol (VENTOLIN HFA) 108 (90 Base) MCG/ACT inhaler, Inhale 2 puffs into the lungs every 6 (six) hours as needed for wheezing or shortness of breath., Disp: 18 g, Rfl: 2   azelastine (OPTIVAR) 0.05 % ophthalmic solution, Apply 1 drop to eye 2 (two) times daily., Disp: , Rfl:    azithromycin (ZITHROMAX) 250 MG tablet, 2 tabs day 1, then one tab daily, Disp: 6 tablet, Rfl: 0   cholecalciferol (VITAMIN D) 1000 UNITS tablet, Take 1,000 Units by mouth daily., Disp: , Rfl:    cyclobenzaprine (FLEXERIL) 5 MG tablet, TAKE 1 TABLET THREE TIMES DAILY AS NEEDED FOR MUSCLE SPASMS., Disp: 90 tablet, Rfl: 3   estradiol (ESTRACE) 0.5 MG tablet, Take 1 tablet (0.5 mg total) by mouth daily., Disp: 90 tablet, Rfl: 4   famotidine (PEPCID) 40 MG tablet, Take 1 tablet (40 mg total) by mouth daily., Disp: 90 tablet, Rfl: 3   Multiple Vitamin (MULTIVITAMIN PO), Take 1 tablet by mouth daily., Disp: , Rfl:    naproxen (NAPROSYN) 500 MG tablet, TAKE 1 TABLET BY MOUTH TWICE DAILY WITH A MEAL., Disp: 60 tablet, Rfl: 3   Oxycodone HCl 20 MG TABS, Take 0.5 tablets (10 mg total) by mouth 4 (four) times daily as needed., Disp: 60 tablet, Rfl: 0   Oxycodone HCl 20 MG TABS, Take 0.5 tablets (10 mg total) by mouth 4 (four) times daily as needed., Disp: 60 tablet, Rfl: 0   Oxycodone HCl 20 MG TABS, Take 0.5 tablets (10 mg total) by mouth 4 (four) times daily as needed., Disp: 60 tablet, Rfl: 0   pantoprazole (PROTONIX) 40 MG tablet, Take 1 tablet (40 mg total) by mouth at bedtime., Disp: 90 tablet, Rfl: 3   polyethylene glycol (MIRALAX / GLYCOLAX) packet, Take 8.5 g by mouth every other day., Disp: , Rfl:    rizatriptan (MAXALT) 10 MG tablet, TAKE 1 TABLET AT ONSET OF MIGRAINE- MAY REPEAT ONCE IN 2 HOURS. LIMIT 2/24 HOURS. AVOID DAILY USE., Disp: 8 tablet, Rfl: 3   senna (SENOKOT) 8.6 MG tablet, Take  1 tablet by mouth as needed for constipation., Disp: , Rfl:    Turmeric (QC TUMERIC COMPLEX) 500 MG CAPS, Take by mouth., Disp: , Rfl:    UBRELVY 100 MG TABS, Take 100 mg by mouth daily as needed., Disp: 16 tablet, Rfl: 5   verapamil (CALAN-SR) 120 MG CR tablet, TAKE ONE TABLET AT BEDTIME, Disp: 90 tablet, Rfl: 3  EXAM:  VITALS per patient if applicable:  GENERAL: alert, oriented, appears well and in no acute distress  HEENT: atraumatic, conjunttiva clear, no obvious abnormalities on inspection of external nose and ears, on limited video visit ema no drainage from the ear, on self exam she notes mild tender nodes below the ear on the R, she denies discomfort on ear/tragus pull  NECK: normal movements of the head and neck  LUNGS: on inspection no signs of respiratory distress, breathing rate appears normal, no obvious gross SOB, gasping or wheezing  CV: no obvious cyanosis  MS: moves all visible extremities without noticeable abnormality  PSYCH/NEURO: pleasant and cooperative, no obvious depression or anxiety, speech and thought processing grossly intact  ASSESSMENT AND PLAN:  Discussed the following assessment and plan:  Discomfort of right ear  -we discussed possible serious and likely etiologies, options for evaluation and workup, limitations of telemedicine visit vs in person visit, treatment, treatment risks and precautions. Pt is agreeable to treatment via telemedicine at this moment. Advised inperson visit would be best, but she would like to try empiric tx with abx for possible om. She feels if she saw PCP he would give her zpack and she prefers this abx if possible as she tolerates well. Discussed risks, possible alt etiologies. Advised to seek prompt virtual visit or in person care if worsening, new symptoms arise, or if is not improving with treatment as expected per our conversation of expected course. Discussed options for follow up care. Did let this patient know that I do  telemedicine on Tuesdays and Thursdays for [facility] and those are the days I am logged into the system. Advised to schedule follow up visit with PCP, [facility] virtual visits or [facility] if any further questions or concerns to avoid delays in care.   I discussed the assessment and treatment plan with the patient. The patient was provided an opportunity to ask questions and all were answered. The patient agreed with the plan and demonstrated an understanding of the instructions.     Lucretia Kern, DO

## 2022-12-16 ENCOUNTER — Telehealth: Payer: Self-pay | Admitting: Internal Medicine

## 2022-12-16 NOTE — Telephone Encounter (Signed)
Spoke with patient, says she will call her PCP office and schedule f/u

## 2022-12-16 NOTE — Telephone Encounter (Signed)
Pt had VV with Blima Singer on 12/09/22, took  zpak, did well for a few days, now symptoms have returned. Asking if another round of meds is needed. Please advised

## 2022-12-17 ENCOUNTER — Other Ambulatory Visit: Payer: Self-pay | Admitting: Internal Medicine

## 2022-12-17 MED ORDER — CEFDINIR 300 MG PO CAPS: 300.0000 mg | ORAL_CAPSULE | Freq: Two times a day (BID) | ORAL | 0 refills | Status: DC

## 2022-12-18 NOTE — Progress Notes (Signed)
NEUROLOGY FOLLOW UP OFFICE NOTE  JAYLEANA FIDANZA DW:[id number]  Assessment/Plan:   Chronic migraine without aura, without status migrainosus, not intractable. - while migraines are controlled on CGRP inhibitor, she prefers Botox as it also helps with her chronic neck pain.  As she has new insurance, will check to see if Botox will now be more affordable.  Aimovig is likely not covered by her new insurance this year anyway Nurse, mental health).  Off treatment, her migraines are over 15 days a month. Visual disturbance, positional.  Unclear if it may be migraine aura triggered by neck discomfort.   1.  Migraine prevention:  Stop Aimovig.  Start Botox 2.  Migraine rescue:  Roselyn Meier.  She has rizatriptan for second line but hasn't needed it. 3.  Limit use of pain relievers to no more than 2 days out of week to prevent risk of rebound or medication-overuse headache. 4.  Keep headache diary 5.  Follow up for Botox   Subjective:  Hatsumi Traughber is a 62 year old right-handed Caucasian woman who follows up for migraines.   UPDATE: Due to expense of Botox, she was switched to [place] in June.   Intensity:  severe Duration:  1 hour with Ubrelvy  Frequency: once a month. She was told by her new insurance last month that she will need a new prior auth for Aimovig.  While Aimovig keeps migraines controlled, it does not help her chronic neck pain like the way Botox did.  In the last 8 months, she has been experiencing new symptoms.  When she sits up in a chair she has monocular visual disturbance in either eye, described as squiggly lines, sometimes a flash or white patch.  It lasts as long as she is sitting up in that position.  Right sided neck pain is aggravated in that position.  Eye exams have been unrevealing.  MRI of brain, cervical and thoracic spine on 05/07/2022 revealed no changes compared to prior imaging.   She also has been feeling dizzy and has lost her sense of taste for a while.  Current NSAIDS:   naproxen (take with triptan) Current analgesics:  oxycodone Current triptans:  Maxalt '10mg'$   Current ergotamine:  none Current anti-emetic:  none Current muscle relaxants:  Flexeril '5mg'$  Current anti-anxiolytic:  none Current sleep aide:  none Current Antihypertensive medications:  Verapamil CR '128mg'$  Current Antidepressant medications:  none Current Anticonvulsant medications:  none Current anti-CGRP:  Aimovig '140mg'$ , Ubrelvy '100mg'$  Current Vitamins/Herbal/Supplements:  none Current Antihistamines/Decongestants:  Flonase Other therapy:  none Hormone/birth control:  estradiol   Caffeine:  1 1/2 cups coffee daily Diet:  Keeps hydrated.  Does not skip meals.  On Optivia diet.  Lost 30 lbs since January Exercise:  Walks dogs Depression:  no; Anxiety:  no Other pain:  Spinal pain related to perineural cysts.  Recent knee replacement, still with mild soreness Sleep hygiene:  good   HISTORY:  Onset:  Since the early 1990s.  She was evaluated at [facility].  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    MRI of spine from 2005 showed a syrinx from T3 to T12 levels.  She subsequently underwent occipital posterior fossa decompression surgery that year.  A CT cystogram in 2007 showed residual cyst in the posterior fossa with postoperative changes but no hydrocephalus or other intracranial mass.  Headaches subsided following decompression.  They started to return in May 2016.  She was previously followed by Dr. Krista Blue at [facility]. Location:  Bifrontal/posterior neck pain Quality:  Pressure, throbbing Initial intensity:  10/10.  she denies new headache, thunderclap headache or severe headache that wakes her from sleep. Aura:  no Premonitory Phase:  no Postdrome:  no Associated symptoms:  With or without nausea, photophobia, phonophobia.   She denies associated unilateral numbness or weakness. Initial Duration:  1-2 days.  Often in the morning when she wakes up Initial Frequency:  15 days a month Initial Frequency of abortive medication: sumatriptan or rizatriptan 12-15 days a month Triggers:  Prolonged standing or activity/lifting Relieving factors:  Just medications. Activity:  Can't function   Past NSAIDS:  Cambia, ibuprofen Past analgesics:  Excedrin, Tylenol Past abortive triptans:  Relpax '40mg'$ , Zomig '5mg'$  NS, sumatriptan [place]/tab Past abortive ergotamine:  none Past muscle relaxants:  none Past anti-emetic:  Zofran Past antihypertensive medications:  atenolol Past antidepressant medications:  Nortriptyline (did not feel well on it) Past anticonvulsant medications:  Trokendi XR,topiramate '200mg'$  twice daily;  gabapentin, Lyrica Past anti-CGRP: Emgality (palpitations) Past vitamins/Herbal/Supplements:  none Past antihistamines/decongestants:  none Other past therapies:  Botox (effective)   Family history of headache:  No   05/07/2022 MRI BRAIN WO:  Distant suboccipital craniotomy. Mega cisterna magna, unchanged in appearance compared to the examination of 05/20/2007.  Otherwise normal appearance of the brain with exception of a punctate focus of T2 and FLAIR signal in the right temporal lobe, unchanged since 2008, not significant. 05/07/2022 MRI C-SPINE WO:  Prominence of the central canal from upper C6 to the level of the C7-T1 disc, maximal transverse diameter 2 mm, the same as was described in 2009.  No compressive pathology of the canal or foramina. Mild facet osteoarthritis on the right at C3-4 and C4-5. 05/07/2022 MRI T-SPINE WO:  No progressive or worsening finding compared to the study of 2009. Thoracic syrinx extending from the level of the T3-4 disc to the T11-12 disc level. The syrinx is actually slightly smaller when compared to the prior study, maximal transverse diameter at T4 measuring 3 mm today compared  with 3.2 mm previously and with slight reduction in transverse diameter in the lower thoracic region, though the maximal measurement at the T10 level remains 3.6 mm.  PAST MEDICAL HISTORY: Past Medical History:  Diagnosis Date   Abnormal weight gain    Acute sinusitis 12/05/2008   1/18 8/18   Arachnoid cyst 08/19/2011   Chest tightness or pressure 05/07/2018   Constipation 09/08/2012   Chronic - opioid related 11/17 Amitiza   Contact dermatitis 03/16/2013   5/14 relapsing - poison ivy    Dyspnea 05/07/2018   Elevated liver enzymes 03/04/2011   Chronic off and on    Female stress incontinence 02/06/2009   Qualifier: Diagnosis of  By: Alain Marion MD, Evie Lacks    FEVER UNSPECIFIED 09/29/2007   Qualifier: Diagnosis of  By: Alain Marion MD, Evie Lacks    GERD 09/29/2007   Chronic  Protonix prn  Potential benefits of a long term PPI use as well as potential risks  and complications were explained to the patient and were aknowledged.     GERD (gastroesophageal reflux disease)    Headache 12/20/2007   Dr Hassell Done Migraines (1 per 2 wks) Topamax 400 mg/d; Imitrex prn; Ibuprofen prn, Zofran prn    Hyperglycemia 08/19/2011   Mild     Hypertension    Increased blood pressure (not hypertension) 08/23/2014  Intractable migraine 06/14/2009   Chronic Ibuprofen, Imitrex, Topamax,  Nortriptyline - intolerant   Knee pain 03/04/2011   R knee 2018 L knee pain - L knee b anserina and L knee is tender w/ROM: L knee pain is worse - patellofemoral syndrome vs other   LBP (low back pain)    Local anesthetic drug adverse reaction 01/21/2022   LOW BACK PAIN 09/29/2007   Chronic, lately (2016) disabling Due to syringomyelia On Oxycodone  Potential benefits of a long term opioids use as well as potential risks (i.e. addiction risk, apnea etc) and complications (i.e. Somnolence, constipation and others) were explained to the patient and were aknowledged.     LUQ PAIN 05/23/2010   Qualifier: Diagnosis of  By: Plotnikov MD, Evie Lacks    Migraine, chronic, without aura, intractable 10/28/2017   Botox approved diagnosis. 7/19 Worse. Start Emagility inj. Relpax   Migraines    Nausea & vomiting 08/19/2011   Nausea without vomiting 06/14/2009   Due to migraines     NECK PAIN 06/14/2009    Chronic, lately (2016) disabling Due to syringomyelia On Oxycodone  Potential benefits of a long term opioids use as well as potential risks (i.e. addiction risk, apnea etc) and complications (i.e. Somnolence, constipation and others) were explained to the patient and were aknowledged.     NONSPECIFIC ABNORMAL RESULTS LIVR FUNCTION STUDY 09/28/2009   Qualifier: Diagnosis of  By: Alain Marion MD, Evie Lacks    OBSESSIVE-COMPULSIVE DISORDER 05/23/2010   Chronic - compulsive shopping    Onychomycosis 01/30/2016   4/17    Orthostatic hypotension 06/30/2013   9/14 likely Rapaflo induced    OTITIS EXTERNA 05/10/2008   Qualifier: Diagnosis of  By: Alain Marion MD, Evie Lacks    Palpitations 06/21/2014   PARESTHESIA 07/13/2008   Qualifier: Diagnosis of  By: Alain Marion MD, Evie Lacks    Partial thickness burn of left wrist 09/11/2015   Postoperative state 07/24/2016   Pyelonephritis 2008   Rash 07/27/2017   Stress 2009   Syncope, near 06/30/2013   9/14 likely Rapaflo induced 12/14 relapsing off Rapaflo x 7 2/15 no relapsing since on 1/2 dose of Topamax    Syringobulbia ([place])    SYRINGOMYELIA 08/20/2007   Chronic Dr Krista Blue Dr Jalene Mullet at [facility] Chronic pain    Syringomyelia ([place])    Tachycardia    Tonsillitis, chronic 02/26/2017   2018 worse   URINARY TRACT INFECTION (UTI) 03/23/2008   Qualifier: Diagnosis of  By: Alain Marion MD, Evie Lacks    UTI (lower urinary tract infection)    Well adult exam 12/21/2013   We discussed age appropriate health related issues, including available/recomended screening tests and vaccinations. We discussed a need for adhering to healthy diet and exercise. Labs/EKG were reviewed/ordered. All questions were answered.        MEDICATIONS: Current Outpatient Medications on File Prior to Visit  Medication Sig Dispense Refill   cefdinir (OMNICEF) 300 MG capsule Take 1 capsule (300 mg total) by mouth 2 (two) times daily. 20 capsule 0   AIMOVIG 140 MG/ML SOAJ Inject 140 mg into the skin every 28 (twenty-eight) days. 1.12 mL 5   albuterol (VENTOLIN HFA) 108 (90 Base) MCG/ACT inhaler Inhale 2 puffs into the lungs every 6 (six) hours as needed for wheezing or shortness of breath. 18 g 2   azelastine (OPTIVAR) 0.05 % ophthalmic solution Apply 1 drop to eye 2 (two) times daily.     azithromycin (ZITHROMAX) 250 MG tablet 2 tabs day 1, then  one tab daily 6 tablet 0   cholecalciferol (VITAMIN D) 1000 UNITS tablet Take 1,000 Units by mouth daily.     cyclobenzaprine (FLEXERIL) 5 MG tablet TAKE 1 TABLET THREE TIMES DAILY AS NEEDED FOR MUSCLE SPASMS. 90 tablet 3   estradiol (ESTRACE) 0.5 MG tablet Take 1 tablet (0.5 mg total) by mouth daily. 90 tablet 4   famotidine (PEPCID) 40 MG tablet Take 1 tablet (40 mg total) by mouth daily. 90 tablet 3   Multiple Vitamin (MULTIVITAMIN PO) Take 1 tablet by mouth daily.     naproxen (NAPROSYN) 500 MG tablet TAKE 1 TABLET BY MOUTH TWICE DAILY WITH A MEAL. 60 tablet 3   Oxycodone HCl 20 MG TABS Take 0.5 tablets (10 mg total) by mouth 4 (four) times daily as needed. 60 tablet 0   Oxycodone HCl 20 MG TABS Take 0.5 tablets (10 mg total) by mouth 4 (four) times daily as needed. 60 tablet 0   Oxycodone HCl 20 MG TABS Take 0.5 tablets (10 mg total) by mouth 4 (four) times daily as needed. 60 tablet 0   pantoprazole (PROTONIX) 40 MG tablet Take 1 tablet (40 mg total) by mouth at bedtime. 90 tablet 3   polyethylene glycol (MIRALAX / GLYCOLAX) packet Take 8.5 g by mouth every other day.     rizatriptan (MAXALT) 10 MG tablet TAKE 1 TABLET AT ONSET OF MIGRAINE- MAY REPEAT ONCE IN 2 HOURS. LIMIT 2/24 HOURS. AVOID DAILY USE. 8 tablet 3   senna (SENOKOT) 8.6 MG tablet Take 1 tablet by mouth as needed for  constipation.     Turmeric (QC TUMERIC COMPLEX) 500 MG CAPS Take by mouth.     UBRELVY 100 MG TABS Take 100 mg by mouth daily as needed. 16 tablet 5   verapamil (CALAN-SR) 120 MG CR tablet TAKE ONE TABLET AT BEDTIME 90 tablet 3   No current facility-administered medications on file prior to visit.    ALLERGIES: Allergies  Allergen Reactions   Atenolol Other (See Comments)    Wt gain   Cefuroxime Axetil Nausea Only and Other (See Comments)    REACTION: nausea - but tolerates PCN   Codeine Sulfate Nausea And Vomiting and Nausea Only   Fentanyl Nausea And Vomiting and Other (See Comments)    REACTION: bad reaction   Imdur [Isosorbide Nitrate] Other (See Comments)    Headaches   Nitrofurantoin Other (See Comments)    Unknown   Pregabalin Other (See Comments)    REACTION: cp   Rapaflo [Silodosin] Other (See Comments)    Orthostatic BP drop, near syncope    Topamax [Topiramate]     Tight chest    Tramadol Other (See Comments)   Tramadol Hcl Nausea And Vomiting    REACTION: sick   Iodinated Contrast Media Rash    FAMILY HISTORY: Family History  Problem Relation Age of Onset   COPD Mother    Peripheral vascular disease Mother    Hypertension Mother    Heart disease Mother    Hypertension Father    Heart attack Maternal Grandmother    Stroke Maternal Grandfather    Stroke Paternal Grandfather    Breast cancer Maternal Aunt    Colon cancer Maternal Uncle 60   Anxiety disorder Other    Asthma Granddaughter       Objective:   Blood pressure 137/88, pulse 83, height '5\' 4"'$  (1.626 m), weight 152 lb (68.9 kg), SpO2 99 %. General: No acute distress.  Patient appears well-groomed.  Head:  Normocephalic/atraumatic Eyes:  Fundi examined but not visualized Neck: supple, mild right paraspinal tenderness, full range of motion Heart:  Regular rate and rhythm Neurological Exam: alert and oriented to person, place, and time.  Speech fluent and not dysarthric, language intact.  CN  II-XII intact. Bulk and tone normal, muscle strength 5/5 throughout.  Sensation to light touch intact.  Deep tendon reflexes 2+ throughout.  Finger to nose testing intact.  Gait normal, Romberg negative.   Metta Clines, DO  CC: Lew Dawes, MD

## 2022-12-19 ENCOUNTER — Encounter: Payer: Self-pay | Admitting: Neurology

## 2022-12-19 ENCOUNTER — Ambulatory Visit: Payer: Medicare Other | Admitting: Neurology

## 2022-12-19 VITALS — BP 137/88 | HR 83 | Ht 64.0 in | Wt 152.0 lb

## 2022-12-19 DIAGNOSIS — G43709 Chronic migraine without aura, not intractable, without status migrainosus: Secondary | ICD-10-CM

## 2022-12-19 MED ORDER — UBRELVY 100 MG PO TABS: 10.0000 mg | ORAL_TABLET | ORAL | 11 refills | Status: DC | PRN

## 2022-12-19 NOTE — Patient Instructions (Signed)
Refilled Beth Huffman Restart Botox

## 2022-12-22 ENCOUNTER — Telehealth: Payer: Self-pay

## 2022-12-22 NOTE — Telephone Encounter (Signed)
-----   Message from Reather Laurence, LPN sent at [phone] 10:38 AM EST ----- Please start botox and schedule per [facility] :)

## 2022-12-23 ENCOUNTER — Encounter: Payer: Self-pay | Admitting: Internal Medicine

## 2022-12-24 ENCOUNTER — Telehealth: Payer: Self-pay | Admitting: Pharmacy Technician

## 2022-12-24 ENCOUNTER — Encounter: Payer: Self-pay | Admitting: Internal Medicine

## 2022-12-24 ENCOUNTER — Other Ambulatory Visit (HOSPITAL_COMMUNITY): Payer: Self-pay

## 2022-12-24 ENCOUNTER — Ambulatory Visit (INDEPENDENT_AMBULATORY_CARE_PROVIDER_SITE_OTHER): Payer: Medicare Other | Admitting: Internal Medicine

## 2022-12-24 VITALS — BP 124/78 | HR 75 | Temp 98.9°F | Ht 64.0 in | Wt 156.0 lb

## 2022-12-24 DIAGNOSIS — Z1211 Encounter for screening for malignant neoplasm of colon: Secondary | ICD-10-CM

## 2022-12-24 DIAGNOSIS — M5441 Lumbago with sciatica, right side: Secondary | ICD-10-CM

## 2022-12-24 DIAGNOSIS — J0101 Acute recurrent maxillary sinusitis: Secondary | ICD-10-CM

## 2022-12-24 DIAGNOSIS — G43709 Chronic migraine without aura, not intractable, without status migrainosus: Secondary | ICD-10-CM

## 2022-12-24 DIAGNOSIS — G43109 Migraine with aura, not intractable, without status migrainosus: Secondary | ICD-10-CM | POA: Diagnosis not present

## 2022-12-24 DIAGNOSIS — G8929 Other chronic pain: Secondary | ICD-10-CM

## 2022-12-24 DIAGNOSIS — M5442 Lumbago with sciatica, left side: Secondary | ICD-10-CM

## 2022-12-24 DIAGNOSIS — D485 Neoplasm of uncertain behavior of skin: Secondary | ICD-10-CM

## 2022-12-24 MED ORDER — OXYCODONE HCL 20 MG PO TABS: 10.0000 mg | ORAL_TABLET | Freq: Four times a day (QID) | ORAL | 0 refills | Status: DC | PRN

## 2022-12-24 MED ORDER — EMGALITY 120 MG/ML ~~LOC~~ SOAJ: 120.0000 mg | SUBCUTANEOUS | 11 refills | Status: DC

## 2022-12-24 MED ORDER — FLUCONAZOLE 150 MG PO TABS: 150.0000 mg | ORAL_TABLET | Freq: Once | ORAL | 1 refills | Status: AC

## 2022-12-24 MED ORDER — NAPROXEN 500 MG PO TABS: 500.0000 mg | ORAL_TABLET | Freq: Two times a day (BID) | ORAL | 3 refills | Status: DC

## 2022-12-24 MED ORDER — NURTEC 75 MG PO TBDP: ORAL_TABLET | ORAL | 3 refills | Status: DC

## 2022-12-24 NOTE — Progress Notes (Signed)
Subjective:  Patient ID: Beth Huffman, female    DOB: 1960-12-19  Age: 62 y.o. MRN: [id number]  CC: Follow-up (Mole on her back and referral for colon screening , headache medication not covered )   HPI Beth Huffman presents for LBP, migraines (Nurtec is covered). BCBS denied Beth Huffman F/u on URI   Outpatient Medications Prior to Visit  Medication Sig Dispense Refill   albuterol (VENTOLIN HFA) 108 (90 Base) MCG/ACT inhaler Inhale 2 puffs into the lungs every 6 (six) hours as needed for wheezing or shortness of breath. 18 g 2   azelastine (OPTIVAR) 0.05 % ophthalmic solution Apply 1 drop to eye 2 (two) times daily.     cholecalciferol (VITAMIN D) 1000 UNITS tablet Take 1,000 Units by mouth daily.     cyclobenzaprine (FLEXERIL) 5 MG tablet TAKE 1 TABLET THREE TIMES DAILY AS NEEDED FOR MUSCLE SPASMS. 90 tablet 3   estradiol (ESTRACE) 0.5 MG tablet Take 1 tablet (0.5 mg total) by mouth daily. 90 tablet 4   famotidine (PEPCID) 40 MG tablet Take 1 tablet (40 mg total) by mouth daily. 90 tablet 3   Multiple Vitamin (MULTIVITAMIN PO) Take 1 tablet by mouth daily.     Oxycodone HCl 20 MG TABS Take 0.5 tablets (10 mg total) by mouth 4 (four) times daily as needed. 60 tablet 0   Oxycodone HCl 20 MG TABS Take 0.5 tablets (10 mg total) by mouth 4 (four) times daily as needed. 60 tablet 0   polyethylene glycol (MIRALAX / GLYCOLAX) packet Take 8.5 g by mouth every other day.     rizatriptan (MAXALT) 10 MG tablet TAKE 1 TABLET AT ONSET OF MIGRAINE- MAY REPEAT ONCE IN 2 HOURS. LIMIT 2/24 HOURS. AVOID DAILY USE. 8 tablet 3   senna (SENOKOT) 8.6 MG tablet Take 1 tablet by mouth as needed for constipation.     Turmeric (QC TUMERIC COMPLEX) 500 MG CAPS Take by mouth.     verapamil (CALAN-SR) 120 MG CR tablet TAKE ONE TABLET AT BEDTIME 90 tablet 3   cefdinir (OMNICEF) 300 MG capsule Take 1 capsule (300 mg total) by mouth 2 (two) times daily. 20 capsule 0   naproxen (NAPROSYN) 500 MG tablet  TAKE 1 TABLET BY MOUTH TWICE DAILY WITH A MEAL. 60 tablet 3   Oxycodone HCl 20 MG TABS Take 0.5 tablets (10 mg total) by mouth 4 (four) times daily as needed. 60 tablet 0   pantoprazole (PROTONIX) 40 MG tablet Take 1 tablet (40 mg total) by mouth at bedtime. 90 tablet 3   AIMOVIG 140 MG/ML SOAJ Inject 140 mg into the skin every 28 (twenty-eight) days. (Patient not taking: Reported on 12/24/2022) 1.12 mL 5   Ubrogepant (UBRELVY) 100 MG TABS Take 0.1 tablets (10 mg total) by mouth as needed (May repeat after 2 hours.  Maximum 2 tablets in 24 hours.). (Patient not taking: Reported on 12/24/2022) 16 tablet 11   No facility-administered medications prior to visit.    ROS: Review of Systems  Constitutional:  Negative for activity change, appetite change, chills, fatigue and unexpected weight change.  HENT:  Negative for congestion, mouth sores, rhinorrhea and sinus pressure.   Eyes:  Negative for visual disturbance.  Respiratory:  Negative for cough and chest tightness.   Gastrointestinal:  Negative for abdominal pain and nausea.  Genitourinary:  Negative for difficulty urinating, frequency and vaginal pain.  Musculoskeletal:  Positive for back pain and gait problem.  Skin:  Negative for pallor and rash.  Neurological:  Positive for headaches. Negative for dizziness, tremors, weakness and numbness.  Hematological:  Does not bruise/bleed easily.  Psychiatric/Behavioral:  Negative for confusion and sleep disturbance. The patient is not nervous/anxious.     Objective:  BP 124/78 (BP Location: Right Arm, Patient Position: Sitting, Cuff Size: Normal)   Pulse 75   Temp 98.9 F (37.2 C) (Oral)   Ht '5\' 4"'$  (1.626 m)   Wt 156 lb (70.8 kg)   SpO2 98%   BMI 26.78 kg/m   BP Readings from Last 3 Encounters:  12/24/22 124/78  12/19/22 137/88  11/19/22 120/70    Wt Readings from Last 3 Encounters:  12/24/22 156 lb (70.8 kg)  12/19/22 152 lb (68.9 kg)  11/19/22 155 lb (70.3 kg)    Physical  Exam Constitutional:      General: She is not in acute distress.    Appearance: She is well-developed. She is obese.  HENT:     Head: Normocephalic.     Right Ear: External ear normal.     Left Ear: External ear normal.     Nose: Nose normal.  Eyes:     General:        Right eye: No discharge.        Left eye: No discharge.     Conjunctiva/sclera: Conjunctivae normal.     Pupils: Pupils are equal, round, and reactive to light.  Neck:     Thyroid: No thyromegaly.     Vascular: No JVD.     Trachea: No tracheal deviation.  Cardiovascular:     Rate and Rhythm: Normal rate and regular rhythm.     Heart sounds: Normal heart sounds.  Pulmonary:     Effort: No respiratory distress.     Breath sounds: No stridor. No wheezing.  Abdominal:     General: Bowel sounds are normal. There is no distension.     Palpations: Abdomen is soft. There is no mass.     Tenderness: There is no abdominal tenderness. There is no guarding or rebound.  Musculoskeletal:        General: Tenderness present.     Cervical back: Normal range of motion and neck supple. No rigidity.  Lymphadenopathy:     Cervical: No cervical adenopathy.  Skin:    Findings: No erythema or rash.  Neurological:     Cranial Nerves: No cranial nerve deficit.     Motor: No abnormal muscle tone.     Coordination: Coordination normal.     Deep Tendon Reflexes: Reflexes normal.  Psychiatric:        Behavior: Behavior normal.        Thought Content: Thought content normal.        Judgment: Judgment normal.   LS w/pain Growth on LS back   A total time of 45 minutes was spent preparing to see the patient, reviewing tests, x-rays, operative reports and other medical records.  Also, obtaining history and performing comprehensive physical exam.  Additionally, counseling the patient regarding: BCBS denied Aimovig, Ubrelvy.  Finally, documenting clinical information in the health records, coordination of care, educating the patient. It  is a complex case.   Lab Results  Component Value Date   WBC 5.2 04/25/2021   HGB 13.3 04/25/2021   HCT 39.6 04/25/2021   PLT 210.0 04/25/2021   GLUCOSE 84 04/04/2022   CHOL 225 (H) 04/25/2021   TRIG 110.0 04/25/2021   HDL 70.00 04/25/2021   LDLDIRECT 130.2 12/21/2013   LDLCALC 133 (H)  04/25/2021   ALT 27 04/25/2021   AST 25 04/25/2021   NA 138 04/04/2022   K 4.6 04/04/2022   CL 102 04/04/2022   CREATININE 0.79 04/04/2022   BUN 12 04/04/2022   CO2 26 04/04/2022   TSH 1.81 04/25/2021   HGBA1C 5.5 03/31/2018    MR Thoracic Spine Wo Contrast  Result Date: 05/08/2022 CLINICAL DATA:  Syringomyelia EXAM: MRI THORACIC SPINE WITHOUT CONTRAST TECHNIQUE: Multiplanar, multisequence MR imaging of the thoracic spine was performed. No intravenous contrast was administered. COMPARISON:  MRI 04/18/2008 FINDINGS: Alignment:  Normal thoracic alignment. Vertebrae: No fracture or focal bone lesion. Cord: The cord is normal from T1 to the level of the T3-4 disc. There is dilatation of the central canal or a thoracic cord syrinx extending from the T3-4 disc level as far as the T11-12 disc level. Largest diameter is at the lower T4 level measuring 3 mm and at the T10 level measuring 3.6 mm. This is actually smaller than was seen in 2009 when the diameter at lower T4 measured 3.2 mm and at T10 with a maximal diameter was also 3.6 mm but there was a fuller/more rounded configuration. Paraspinal and other soft tissues: Otherwise normal. Few incidental root sleeve cysts. Disc levels: No compressive disc pathology. Minimal midthoracic disc bulges but wide patency of the canal and foramina. IMPRESSION: No progressive or worsening finding compared to the study of 2009. Thoracic syrinx extending from the level of the T3-4 disc to the T11-12 disc level. The syrinx is actually slightly smaller when compared to the prior study, maximal transverse diameter at T4 measuring 3 mm today compared with 3.2 mm previously and  with slight reduction in transverse diameter in the lower thoracic region, though the maximal measurement at the T10 level remains 3.6 mm. Electronically Signed   By: Nelson Chimes M.D.   On: 05/08/2022 15:55   MR Cervical Spine Wo Contrast  Result Date: 05/08/2022 CLINICAL DATA:  Syringomyelia.  Prior brain surgery. EXAM: MRI CERVICAL SPINE WITHOUT CONTRAST TECHNIQUE: Multiplanar, multisequence MR imaging of the cervical spine was performed. No intravenous contrast was administered. COMPARISON:  04/18/2008 partial examination FINDINGS: Alignment: Normal Vertebrae: Normal Cord: As described previously, there is mild prominence of the central canal from the upper C6 vertebral body level to the C7-T1 disc level. Maximal transverse diameter is 2 mm, no larger than what was described in 2009. Otherwise the cord signal is normal. 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 Disc levels: No significant disc level pathology. No disc herniation or stenosis of the canal or foramina. The patient does have facet osteoarthritis on the right at C3-4 and C4-5 with small synovial cysts but no compressive effect upon the neural structures. IMPRESSION: Prominence of the central canal from upper C6 to the level of the C7-T1 disc, maximal transverse diameter 2 mm, the same as was described in 2009. No compressive pathology of the canal or foramina. Mild facet osteoarthritis on the right at C3-4 and C4-5. Electronically Signed   By: Nelson Chimes M.D.   On: 05/08/2022 15:48   MR Brain Wo Contrast  Result Date: 05/08/2022 CLINICAL DATA:  Syringomyelia or syringobulbia. Headaches. History of brain surgery. EXAM: MRI HEAD WITHOUT CONTRAST TECHNIQUE: Multiplanar, multiecho pulse sequences of the brain and surrounding structures were obtained without intravenous contrast. COMPARISON:  Head CT 08/14/2011.  MRI 05/20/2007. FINDINGS: Brain: Distant previous suboccipital  craniotomy. Mega cisterna magna is unchanged since the study of 2008. No  mass effect. The obex is normal. No focal abnormality otherwise affecting the brainstem or cerebellum. Cerebral hemispheres are again normal with a single punctate focus of T2 and FLAIR signal in the right temporal lobe, not significant. No mass lesion, hemorrhage, hydrocephalus or extra-axial collection. Vascular: Major vessels at the base of the brain show flow. Skull and upper cervical spine: Negative Sinuses/Orbits: Clear/normal Other: None IMPRESSION: Distant suboccipital craniotomy. Mega cisterna magna, unchanged in appearance compared to the examination of 05/20/2007. Otherwise normal appearance of the brain with exception of a punctate focus of T2 and FLAIR signal in the right temporal lobe, unchanged since 2008, not significant. Electronically Signed   By: Nelson Chimes M.D.   On: 05/08/2022 15:43    Assessment & Plan:   Problem List Items Addressed This Visit       Cardiovascular and Mediastinum   Migraine headache with aura    F/u w/Dr Tomi Likens Refractory migraines (Nurtec is covered). BCBS denied Aimovig, Ubrelvy      Relevant Medications   Rimegepant Sulfate (NURTEC) 75 MG TBDP   Galcanezumab-gnlm (EMGALITY) 120 MG/ML SOAJ   Oxycodone HCl 20 MG TABS   naproxen (NAPROSYN) 500 MG tablet   Migraine headache    F/u w/Dr Tomi Likens Refractory migraines (Nurtec is covered). BCBS denied Aimovig, Ubrelvy       Relevant Medications   Rimegepant Sulfate (NURTEC) 75 MG TBDP   Galcanezumab-gnlm (EMGALITY) 120 MG/ML SOAJ   Oxycodone HCl 20 MG TABS   naproxen (NAPROSYN) 500 MG tablet     Respiratory   Acute sinusitis    Better on Ceftin Diflucan po        Musculoskeletal and Integument   Neoplasm of uncertain behavior of skin    Growth on LS back - will bx        Other   LOW BACK PAIN    Due to syringomyelia On Oxycodone  Potential benefits of a long term opioids use as well as potential risks (i.e.  addiction risk, apnea etc) and complications (i.e. Somnolence, constipation and others) were explained to the patient and were aknowledged.      Relevant Medications   Oxycodone HCl 20 MG TABS   naproxen (NAPROSYN) 500 MG tablet   Other Visit Diagnoses     Colon cancer screening    -  Primary   Relevant Orders   Cologuard         Meds ordered this encounter  Medications   Rimegepant Sulfate (NURTEC) 75 MG TBDP    Sig: 1 po qod prn    Dispense:  12 tablet    Refill:  3    Refractory migraines. Failed Topamax   Galcanezumab-gnlm (EMGALITY) 120 MG/ML SOAJ    Sig: Inject 120 mg into the skin every 30 (thirty) days. Loading 1st dose is 240 mg. For migraine prophylaxis    Dispense:  2 mL    Refill:  11    Refractory migraines. Pt failed Topamax   fluconazole (DIFLUCAN) 150 MG tablet    Sig: Take 1 tablet (150 mg total) by mouth once for 1 dose.    Dispense:  1 tablet    Refill:  1   Oxycodone HCl 20 MG TABS    Sig: Take 0.5 tablets (10 mg total) by mouth 4 (four) times daily as needed.    Dispense:  60 tablet    Refill:  0    Please fill on or after 03/16/23   naproxen (NAPROSYN) 500 MG tablet  Sig: Take 1 tablet (500 mg total) by mouth 2 (two) times daily with a meal.    Dispense:  60 tablet    Refill:  3      Follow-up: Return in about 3 months (around 03/24/2023) for a follow-up visit.  Walker Kehr, MD

## 2022-12-24 NOTE — Assessment & Plan Note (Signed)
Due to syringomyelia On Oxycodone  Potential benefits of a long term opioids use as well as potential risks (i.e. addiction risk, apnea etc) and complications (i.e. Somnolence, constipation and others) were explained to the patient and were aknowledged.

## 2022-12-24 NOTE — Assessment & Plan Note (Signed)
Growth on LS back - will bx

## 2022-12-24 NOTE — Assessment & Plan Note (Signed)
F/u w/Dr Tomi Likens Refractory migraines (Nurtec is covered). BCBS denied Beth Huffman

## 2022-12-24 NOTE — Telephone Encounter (Signed)
[employer] verification has been submitted. Benefit Verification:   [place] has been submitted for BOTOX 200 via CMM. INSURANCE: BCBS DATE SUBMITTED: 2.28.24 KEY: [id number] Status is pending

## 2022-12-24 NOTE — Assessment & Plan Note (Signed)
Better on Ceftin Diflucan po

## 2022-12-24 NOTE — Assessment & Plan Note (Addendum)
F/u w/Dr Tomi Likens Refractory migraines (Nurtec is covered). BCBS denied Beth Huffman

## 2022-12-25 ENCOUNTER — Other Ambulatory Visit: Payer: Self-pay | Admitting: Internal Medicine

## 2022-12-25 ENCOUNTER — Other Ambulatory Visit (HOSPITAL_COMMUNITY): Payer: Self-pay

## 2022-12-25 ENCOUNTER — Other Ambulatory Visit: Payer: Self-pay | Admitting: Podiatry

## 2022-12-25 MED ORDER — BOTOX 200 UNITS IJ SOLR: INTRAMUSCULAR | 4 refills | Status: DC

## 2022-12-25 MED ORDER — BOTOX 200 UNITS IJ SOLR
INTRAMUSCULAR | 4 refills | Status: DC
  Filled 2022-12-25: qty 1, fill #0

## 2022-12-25 NOTE — Telephone Encounter (Signed)
Patient advised of Approval.

## 2022-12-25 NOTE — Addendum Note (Signed)
Addended by: Venetia Night on: 12/25/2022 02:30 PM   Modules accepted: Orders

## 2022-12-25 NOTE — Telephone Encounter (Signed)
Pharmacy Patient Advocate Encounter- Botox BIV-Medical Benefit:  J code: [id number] CPT code: [id number] Dx Code: G43.709  Please send prescription to Specialty Pharmacy: [facility]: [phone] Estimated Patient cost is: 20% COINSURANCE  Patient NOT eligible for Botox Copay Card. Copay Card can make patient's cost as little as zero. Copay card will be provided to pharmacy.

## 2022-12-25 NOTE — Telephone Encounter (Signed)
Barbaraann Rondo from [place] called, PA has been approved 02.28.24 - 02.28.25.

## 2022-12-25 NOTE — Telephone Encounter (Addendum)
Pharmacy Patient Advocate Encounter- Botox BIV-Pharmacy Benefit:  PA was submitted to [facility] and has been approved through: 2.28.24 - 2.28.25 Authorization# [id number]  Please send prescription to Specialty Pharmacy: [facility]: [phone]  Estimated Copay is: ---  Patient IS NOT eligible for Botox Copay Card, which will make patient's copay as little as zero.

## 2022-12-29 ENCOUNTER — Telehealth: Payer: Self-pay

## 2022-12-29 NOTE — Telephone Encounter (Signed)
Spoke to [place], Per rep Script received 12/25/22, they are still processing the account. Six steps process.   Call back 3/5 to check status. If redy they will be able to delivery in time for her appt 3/8 if not move appt to 3/15.

## 2022-12-29 NOTE — Telephone Encounter (Signed)
Patient called in and LVM she is returning a call, she does want to keep the appointment for Friday. States that she called last week and was on hold with someone for awhile trying to figure out how much it will cost her. Asking for a call back if possible.

## 2022-12-30 ENCOUNTER — Other Ambulatory Visit (HOSPITAL_COMMUNITY): Payer: Self-pay

## 2022-12-30 NOTE — Telephone Encounter (Signed)
Called and left a detailed message on voice mail. About billing that she needs to call insurance

## 2022-12-31 ENCOUNTER — Ambulatory Visit
Admission: RE | Admit: 2022-12-31 | Discharge: 2022-12-31 | Disposition: A | Discharge status: Home / Self Care | Payer: Medicare Other | Source: Ambulatory Visit | Attending: Obstetrics & Gynecology | Admitting: Obstetrics & Gynecology

## 2022-12-31 ENCOUNTER — Encounter: Payer: Self-pay | Admitting: Internal Medicine

## 2022-12-31 DIAGNOSIS — Z1231 Encounter for screening mammogram for malignant neoplasm of breast: Secondary | ICD-10-CM

## 2023-01-01 ENCOUNTER — Other Ambulatory Visit: Payer: Self-pay | Admitting: Obstetrics & Gynecology

## 2023-01-01 DIAGNOSIS — N6310 Unspecified lump in the right breast, unspecified quadrant: Secondary | ICD-10-CM

## 2023-01-01 NOTE — Telephone Encounter (Signed)
Should arrive today 3.7.24

## 2023-01-02 ENCOUNTER — Ambulatory Visit: Payer: Medicare Other | Admitting: Neurology

## 2023-01-02 DIAGNOSIS — G43009 Migraine without aura, not intractable, without status migrainosus: Secondary | ICD-10-CM | POA: Diagnosis not present

## 2023-01-02 NOTE — Progress Notes (Signed)
[facility]  ° °Procedure Note Botox ° °Attending: Dr. Cyann Venti ° °Preoperative Diagnosis(es): Chronic migraine ° °Consent obtained from: The patient °Benefits discussed included, but were not limited to decreased muscle tightness, increased joint range of motion, and decreased pain.  Risk discussed included, but were not limited pain and discomfort, bleeding, bruising, excessive weakness, venous thrombosis, muscle atrophy and dysphagia.  Anticipated outcomes of the procedure as well as he risks and benefits of the alternatives to the procedure, and the roles and tasks of the personnel to be involved, were discussed with the patient, and the patient consents to the procedure and agrees to proceed. A copy of the patient medication guide was given to the patient which explains the blackbox warning. ° °Patients identity and treatment sites confirmed Yes.  . ° °Details of Procedure: °Skin was cleaned with alcohol. Prior to injection, the needle plunger was aspirated to make sure the needle was not within a blood vessel.  There was no blood retrieved on aspiration.   ° °Following is a summary of the muscles injected  And the amount of Botulinum toxin used: ° °Dilution °200 units of Botox was reconstituted with 4 ml of preservative free normal saline. °Time of reconstitution: At the time of the office visit (<30 minutes prior to injection)  ° °Injections  °155 total units of Botox was injected with a 30 gauge needle. ° °Injection Sites: °L occipitalis: 15 units- 3 sites  °R occiptalis: 15 units- 3 sites ° °L upper trapezius: 15 units- 3 sites °R upper trapezius: 15 units- 3 sits          °L paraspinal: 10 units- 2 sites °R paraspinal: 10 units- 2 sites ° °Face °L frontalis(2 injection sites):10 units   °R frontalis(2 injection sites):10 units         °L corrugator: 5 units   °R corrugator: 5 units           °Procerus: 5 units   °L temporalis: 20 units °R temporalis: 20 units  ° °Agent:  °200 units of botulinum Type  A (Onobotulinum Toxin type A) was reconstituted with 4 ml of preservative free normal saline.  °Time of reconstitution: At the time of the office visit (<30 minutes prior to injection)  ° ° ° Total injected (Units):  155 ° Total wasted (Units):  45 ° °Patient tolerated procedure well without complications.   °Reinjection is anticipated in 3 months. ° ° °

## 2023-01-07 DIAGNOSIS — R350 Frequency of micturition: Secondary | ICD-10-CM | POA: Diagnosis not present

## 2023-01-07 DIAGNOSIS — N3946 Mixed incontinence: Secondary | ICD-10-CM | POA: Diagnosis not present

## 2023-01-08 ENCOUNTER — Ambulatory Visit: Payer: Medicare Other | Attending: Internal Medicine | Admitting: Internal Medicine

## 2023-01-08 ENCOUNTER — Other Ambulatory Visit: Payer: Self-pay | Admitting: Internal Medicine

## 2023-01-08 ENCOUNTER — Encounter: Payer: Self-pay | Admitting: Internal Medicine

## 2023-01-08 VITALS — BP 126/84 | HR 76 | Ht 64.0 in | Wt 152.0 lb

## 2023-01-08 DIAGNOSIS — I4719 Other supraventricular tachycardia: Secondary | ICD-10-CM | POA: Diagnosis not present

## 2023-01-08 MED ORDER — DILTIAZEM HCL ER COATED BEADS 120 MG PO CP24: 120.0000 mg | ORAL_CAPSULE | Freq: Every day | ORAL | 3 refills | Status: DC

## 2023-01-08 NOTE — Progress Notes (Signed)
-     Patient Care Team: Plotnikov, Evie Lacks, MD as PCP - General Princess Bruins, MD as Consulting Physician (Obstetrics and Gynecology) Pieter Partridge, DO as Consulting Physician (Neurology) Deboraha Sprang, MD as Consulting Physician (Cardiology) Edrick Kins, DPM as Consulting Physician (Podiatry) Derrill Memo, MD as Referring Physician (Neurosurgery) Spero Geralds, MD as Consulting Physician (Pulmonary Disease)   HPI  Beth Huffman is a 62 y.o. female Seen in follow-up for symptomatic nonsustained atrial tachycardia and hypertension and progressive dyspnea    She has however noted over the last 3-6 months progressive and predictable dyspnea with exertion as well as chest discomfort that is nonradiating.  She notes this both while walking,  (being walked by) her 80 pound dog as well as with swimming.  Both are relieved by rest.  Positive family history.  Positive dyslipidemia.  Has also had 2 episodes in the last 3 months while seated accompanied also by some lightheadedness.  No palpitations.    2/23 CTA>> soft tissue density prevascular lesion 1.5 cm in short axis, possibly a pathologically enlarged lymph node mediastinal Mass  CT >>  2.0 x 1.0 cm ovoid slightly lobular prevascular space mass. No other mediastinal abnormality or hilar adenopathy. This could be a pathologic lymph node PET>> without increased metabolic activity   The patient denies chest pain, nocturnal dyspnea, orthopnea or peripheral edema.  There have been  syncope.  Complains of dyspnea which will much better still problematic particularly in the heat and is being followed by pulmonary.  Occasional palpitations, a couple of times a year lasting seconds associated with lightheadedness but overall has been controlled on verapamil  Has problems with constipation.    DATE TEST EF    7/19 Echo   60-65 %    7/19 LHC  65 % Normal CAs   3/23 CTA   CaScore = 0     Date Cr K Hgb  11/19 0.98  3.8 13.2   6/22 0.9 4.5 13.3  3/23 0.81 4.1          Records and Results Reviewed   Past Medical History:  Diagnosis Date   Abnormal weight gain    Acute sinusitis 12/05/2008   1/18 8/18   Arachnoid cyst 08/19/2011   Chest tightness or pressure 05/07/2018   Constipation 09/08/2012   Chronic - opioid related 11/17 Amitiza   Contact dermatitis 03/16/2013   5/14 relapsing - poison ivy    Dyspnea 05/07/2018   Elevated liver enzymes 03/04/2011   Chronic off and on    Female stress incontinence 02/06/2009   Qualifier: Diagnosis of  By: Alain Marion MD, Evie Lacks    FEVER UNSPECIFIED 09/29/2007   Qualifier: Diagnosis of  By: Alain Marion MD, Evie Lacks    GERD 09/29/2007   Chronic  Protonix prn  Potential benefits of a long term PPI use as well as potential risks  and complications were explained to the patient and were aknowledged.     GERD (gastroesophageal reflux disease)    Headache 12/20/2007   Dr Hassell Done Migraines (1 per 2 wks) Topamax 400 mg/d; Imitrex prn; Ibuprofen prn, Zofran prn    Hyperglycemia 08/19/2011   Mild     Hypertension    Increased blood pressure (not hypertension) 08/23/2014   Intractable migraine 06/14/2009   Chronic Ibuprofen, Imitrex, Topamax,  Nortriptyline - intolerant   Knee pain 03/04/2011   R knee 2018 L knee pain - L knee b anserina and L knee is tender  w/ROM: L knee pain is worse - patellofemoral syndrome vs other   LBP (low back pain)    Local anesthetic drug adverse reaction 01/21/2022   LOW BACK PAIN 09/29/2007   Chronic, lately (2016) disabling Due to syringomyelia On Oxycodone  Potential benefits of a long term opioids use as well as potential risks (i.e. addiction risk, apnea etc) and complications (i.e. Somnolence, constipation and others) were explained to the patient and were aknowledged.     LUQ PAIN 05/23/2010   Qualifier: Diagnosis of  By: Plotnikov MD, Evie Lacks    Migraine, chronic, without aura, intractable 10/28/2017   Botox approved diagnosis. 7/19 Worse.  Start Emagility inj. Relpax   Migraines    Nausea & vomiting 08/19/2011   Nausea without vomiting 06/14/2009   Due to migraines     NECK PAIN 06/14/2009    Chronic, lately (2016) disabling Due to syringomyelia On Oxycodone  Potential benefits of a long term opioids use as well as potential risks (i.e. addiction risk, apnea etc) and complications (i.e. Somnolence, constipation and others) were explained to the patient and were aknowledged.     NONSPECIFIC ABNORMAL RESULTS LIVR FUNCTION STUDY 09/28/2009   Qualifier: Diagnosis of  By: Alain Marion MD, Evie Lacks    OBSESSIVE-COMPULSIVE DISORDER 05/23/2010   Chronic - compulsive shopping    Onychomycosis 01/30/2016   4/17    Orthostatic hypotension 06/30/2013   9/14 likely Rapaflo induced    OTITIS EXTERNA 05/10/2008   Qualifier: Diagnosis of  By: Alain Marion MD, Evie Lacks    Palpitations 06/21/2014   PARESTHESIA 07/13/2008   Qualifier: Diagnosis of  By: Alain Marion MD, Evie Lacks    Partial thickness burn of left wrist 09/11/2015   Postoperative state 07/24/2016   Pyelonephritis 2008   Rash 07/27/2017   Stress 2009   Syncope, near 06/30/2013   9/14 likely Rapaflo induced 12/14 relapsing off Rapaflo x 7 2/15 no relapsing since on 1/2 dose of Topamax    Syringobulbia ([place])    SYRINGOMYELIA 08/20/2007   Chronic Dr Krista Blue Dr J[facility]ene Mullet at [facility] Chronic pain    Syringomyelia ([place])    Tachycardia    Tonsillitis, chronic 02/26/2017   2018 worse   URINARY TRACT INFECTION (UTI) 03/23/2008   Qualifier: Diagnosis of  By: Alain Marion MD, Evie Lacks    UTI (lower urinary tract infection)    Well adult exam 12/21/2013   We discussed age appropriate health related issues, including available/recomended screening tests and vaccinations. We discussed a need for adhering to healthy diet and exercise. Labs/EKG were reviewed/ordered. All questions were answered.       Past Surgical History:  Procedure Laterality Date   ABDOMINAL HYSTERECTOMY     ANTERIOR AND POSTERIOR REPAIR N/A  07/24/2016   Procedure: Possible ANTERIOR (CYSTOCELE) repair, perineoplasty;  Surgeon: Princess Bruins, MD;  Location: [facility];  Service: Gynecology;  Laterality: N/A;   BLADDER SUSPENSION N/A 07/24/2016   Procedure: TRANSVAGINAL TAPE (TVT) PROCEDURE;  Surgeon: Princess Bruins, MD;  Location: [facility];  Service: Gynecology;  Laterality: N/A;   COLONOSCOPY     CYSTOSCOPY N/A 07/24/2016   Procedure: CYSTOSCOPY;  Surgeon: Princess Bruins, MD;  Location: [facility];  Service: Gynecology;  Laterality: N/A;   FOOT SURGERY Right 2023   intracranial decompression surgery  2006   LEFT HEART CATH AND CORONARY ANGIOGRAPHY N/A 05/07/2018   Procedure: LEFT HEART CATH AND CORONARY ANGIOGRAPHY;  Surgeon: Belva Crome, MD;  Location: [facility];  Service: Cardiovascular;  Laterality: N/A;  PARTIAL KNEE ARTHROPLASTY Left 09/07/2018   Procedure: UNICOMPARTMENTAL LEFT KNEE;  Surgeon: Renette Butters, MD;  Location: [facility];  Service: Orthopedics;  Laterality: Left;  Adductor Block   ROBOTIC ASSISTED LAPAROSCOPIC SACROCOLPOPEXY N/A 07/24/2016   Procedure: ROBOTIC ASSISTED LAPAROSCOPIC SACROCOLPOPEXY WITH PERINEOPLASTY;  Surgeon: Princess Bruins, MD;  Location: [facility];  Service: Gynecology;  Laterality: N/A;   TUBAL LIGATION      Current Outpatient Medications  Medication Sig Dispense Refill   albuterol (VENTOLIN HFA) 108 (90 Base) MCG/ACT inhaler Inhale 2 puffs into the lungs every 6 (six) hours as needed for wheezing or shortness of breath. 18 g 2   azelastine (OPTIVAR) 0.05 % ophthalmic solution Apply 1 drop to eye 2 (two) times daily.     botulinum toxin Type A (BOTOX) 200 units injection Inject 155 units IM into multiple site in the face,neck and head once every 90 days 1 each 4   cholecalciferol (VITAMIN D) 1000 UNITS tablet Take 1,000 Units by mouth daily.     cyclobenzaprine (FLEXERIL) 5 MG tablet TAKE 1 TABLET THREE TIMES DAILY AS NEEDED FOR MUSCLE SPASMS. 90 tablet 3   estradiol (ESTRACE)  0.5 MG tablet Take 1 tablet (0.5 mg total) by mouth daily. 90 tablet 4   famotidine (PEPCID) 40 MG tablet Take 1 tablet (40 mg total) by mouth daily. 90 tablet 3   Galcanezumab-gnlm (EMGALITY) 120 MG/ML SOAJ Inject 120 mg into the skin every 30 (thirty) days. Loading 1st dose is 240 mg. For migraine prophylaxis 2 mL 11   Multiple Vitamin (MULTIVITAMIN PO) Take 1 tablet by mouth daily.     naproxen (NAPROSYN) 500 MG tablet Take 1 tablet (500 mg total) by mouth 2 (two) times daily with a meal. 60 tablet 3   Oxycodone HCl 20 MG TABS Take 0.5 tablets (10 mg total) by mouth 4 (four) times daily as needed. (Patient not taking: Reported on 01/08/2023) 60 tablet 0   Oxycodone HCl 20 MG TABS Take 0.5 tablets (10 mg total) by mouth 4 (four) times daily as needed. (Patient not taking: Reported on 01/08/2023) 60 tablet 0   Oxycodone HCl 20 MG TABS Take 0.5 tablets (10 mg total) by mouth 4 (four) times daily as needed. 60 tablet 0   pantoprazole (PROTONIX) 40 MG tablet Take 1 tablet (40 mg total) by mouth at bedtime. 90 tablet 3   polyethylene glycol (MIRALAX / GLYCOLAX) packet Take 8.5 g by mouth every other day.     Rimegepant Sulfate (NURTEC) 75 MG TBDP 1 po qod prn 12 tablet 3   rizatriptan (MAXALT) 10 MG tablet TAKE 1 TABLET AT ONSET OF MIGRAINE- MAY REPEAT ONCE IN 2 HOURS. LIMIT 2/24 HOURS. AVOID DAILY USE. 8 tablet 3   senna (SENOKOT) 8.6 MG tablet Take 1 tablet by mouth as needed for constipation.     Turmeric (QC TUMERIC COMPLEX) 500 MG CAPS Take by mouth.     Ubrogepant (UBRELVY) 100 MG TABS Take 0.1 tablets (10 mg total) by mouth as needed (May repeat after 2 hours.  Maximum 2 tablets in 24 hours.). (Patient not taking: Reported on 12/24/2022) 16 tablet 11   No current facility-administered medications for this visit.    Allergies  Allergen Reactions   Atenolol Other (See Comments)    Wt gain   Cefuroxime Axetil Nausea Only and Other (See Comments)    REACTION: nausea - but tolerates PCN    Codeine Sulfate Nausea And Vomiting and Nausea Only   Fentanyl Nausea And  Vomiting and Other (See Comments)    REACTION: bad reaction   Imdur [Isosorbide Nitrate] Other (See Comments)    Headaches   Nitrofurantoin Other (See Comments)    Unknown   Pregabalin Other (See Comments)    REACTION: cp   Rapaflo [Silodosin] Other (See Comments)    Orthostatic BP drop, near syncope    Topamax [Topiramate]     Tight chest    Tramadol Other (See Comments)   Tramadol Hcl Nausea And Vomiting    REACTION: sick   Iodinated Contrast Media Rash      Review of Systems negative except from HPI and PMH  Physical Exam BP 126/84   Pulse 76   Ht '5\' 4"'$  (1.626 m)   Wt 152 lb (68.9 kg)   SpO2 98%   BMI 26.09 kg/m  Well developed and nourished in no acute distress HENT normal Neck supple Clear Regular rate and rhythm, no murmurs or gallops Abd-soft with active BS No Clubbing cyanosis edema Skin-warm and dry A & Oriented  Grossly normal sensory and motor function  ECG sinus at 76 Interval 14/10/38 RSR prime lead V1   Assessment and  Plan  Shortness of breath  Hypertension   Atrial tachycardia  Constipation  HA migraines  Syringomyelia   Dyspnea seems to be better.  Followed by pulmonary.  Mostly seasonal   Blood pressure well-controlled.  With the constipation we are going to discontinue her verapamil and try her on diltiazem 120 as an alternative for both blood pressure as well as her palpitations presumably the atrial tachycardia.  Typically the mechanism of lightheadedness with this is decreased venous return in the triggering of J fibers initiating a vasomotor response.  In her mind the constipation aggravates her syrinx as well as her headaches, and maybe we can make this better

## 2023-01-08 NOTE — Patient Instructions (Signed)
Medication Instructions:  Your physician has recommended you make the following change in your medication:   ** Stop Verapamil  ** Start Diltiazem '120mg'$  - 1 capsule by mouth daily.   *If you need a refill on your cardiac medications before your next appointment, please call your pharmacy*   Lab Work: None ordered.  If you have labs (blood work) drawn today and your tests are completely normal, you will receive your results only by: [place] (if you have MyChart) OR A paper copy in the mail If you have any lab test that is abnormal or we need to change your treatment, we will call you to review the results.   Testing/Procedures: None ordered.    Follow-Up: At [facility], you and your health needs are our priority.  As part of our continuing mission to provide you with exceptional heart care, we have created designated Provider Care Teams.  These Care Teams include your primary Cardiologist (physician) and Advanced Practice Providers (APPs -  Physician Assistants and Nurse Practitioners) who all work together to provide you with the care you need, when you need it.  We recommend signing up for the patient portal called "MyChart".  Sign up information is provided on this After Visit Summary.  MyChart is used to connect with patients for Virtual Visits (Telemedicine).  Patients are able to view lab/test results, encounter notes, upcoming appointments, etc.  Non-urgent messages can be sent to your provider as well.   To learn more about what you can do with MyChart, go to [email or web address].    Your next appointment:   12 months with Dr Caryl Comes

## 2023-01-09 ENCOUNTER — Ambulatory Visit: Payer: Medicare Other | Admitting: Neurology

## 2023-01-12 ENCOUNTER — Ambulatory Visit (INDEPENDENT_AMBULATORY_CARE_PROVIDER_SITE_OTHER): Payer: Medicare Other

## 2023-01-12 ENCOUNTER — Ambulatory Visit: Payer: Medicare Other | Admitting: Podiatry

## 2023-01-12 DIAGNOSIS — M778 Other enthesopathies, not elsewhere classified: Secondary | ICD-10-CM | POA: Diagnosis not present

## 2023-01-12 DIAGNOSIS — R52 Pain, unspecified: Secondary | ICD-10-CM

## 2023-01-12 DIAGNOSIS — M79672 Pain in left foot: Secondary | ICD-10-CM

## 2023-01-12 MED ORDER — METHYLPREDNISOLONE 4 MG PO TBPK: ORAL_TABLET | ORAL | 0 refills | Status: DC

## 2023-01-12 MED ORDER — BETAMETHASONE SOD PHOS & ACET 6 (3-3) MG/ML IJ SUSP
3.0000 mg | Freq: Once | INTRAMUSCULAR | Status: AC
  Administered 2023-01-12: 3 mg via INTRA_ARTICULAR

## 2023-01-12 NOTE — Progress Notes (Signed)
Chief Complaint  Patient presents with   Foot Pain    Patient came in today for left foot lateral side of he foot pain, patient feels like she has a fracture, rate of pain 7 out of 10, X-rays done today    Subjective:  Patient presents today for new complaint of pain and tenderness associated to the left foot.  Patient states that she began to develop left midfoot pain.  At times it can become 7/10 depending on activity.  Denies a history of injury.  She says it is very painful especially when getting out of bed in the mornings.  It slowly subsides after moving her foot.  Past Medical History:  Diagnosis Date   Abnormal weight gain    Acute sinusitis 12/05/2008   1/18 8/18   Arachnoid cyst 08/19/2011   Chest tightness or pressure 05/07/2018   Constipation 09/08/2012   Chronic - opioid related 11/17 Amitiza   Contact dermatitis 03/16/2013   5/14 relapsing - poison ivy    Dyspnea 05/07/2018   Elevated liver enzymes 03/04/2011   Chronic off and on    Female stress incontinence 02/06/2009   Qualifier: Diagnosis of  By: Alain Marion MD, Evie Lacks    FEVER UNSPECIFIED 09/29/2007   Qualifier: Diagnosis of  By: Alain Marion MD, Evie Lacks    GERD 09/29/2007   Chronic  Protonix prn  Potential benefits of a long term PPI use as well as potential risks  and complications were explained to the patient and were aknowledged.     GERD (gastroesophageal reflux disease)    Headache 12/20/2007   Dr Hassell Done Migraines (1 per 2 wks) Topamax 400 mg/d; Imitrex prn; Ibuprofen prn, Zofran prn    Hyperglycemia 08/19/2011   Mild     Hypertension    Increased blood pressure (not hypertension) 08/23/2014   Intractable migraine 06/14/2009   Chronic Ibuprofen, Imitrex, Topamax,  Nortriptyline - intolerant   Knee pain 03/04/2011   R knee 2018 L knee pain - L knee b anserina and L knee is tender w/ROM: L knee pain is worse - patellofemoral syndrome vs other   LBP (low back pain)    Local anesthetic drug adverse reaction  01/21/2022   LOW BACK PAIN 09/29/2007   Chronic, lately (2016) disabling Due to syringomyelia On Oxycodone  Potential benefits of a long term opioids use as well as potential risks (i.e. addiction risk, apnea etc) and complications (i.e. Somnolence, constipation and others) were explained to the patient and were aknowledged.     LUQ PAIN 05/23/2010   Qualifier: Diagnosis of  By: Plotnikov MD, Evie Lacks    Migraine, chronic, without aura, intractable 10/28/2017   Botox approved diagnosis. 7/19 Worse. Start Emagility inj. Relpax   Migraines    Nausea & vomiting 08/19/2011   Nausea without vomiting 06/14/2009   Due to migraines     NECK PAIN 06/14/2009    Chronic, lately (2016) disabling Due to syringomyelia On Oxycodone  Potential benefits of a long term opioids use as well as potential risks (i.e. addiction risk, apnea etc) and complications (i.e. Somnolence, constipation and others) were explained to the patient and were aknowledged.     NONSPECIFIC ABNORMAL RESULTS LIVR FUNCTION STUDY 09/28/2009   Qualifier: Diagnosis of  By: Alain Marion MD, Evie Lacks    OBSESSIVE-COMPULSIVE DISORDER 05/23/2010   Chronic - compulsive shopping    Onychomycosis 01/30/2016   4/17    Orthostatic hypotension 06/30/2013   9/14 likely Rapaflo induced    OTITIS  EXTERNA 05/10/2008   Qualifier: Diagnosis of  By: Alain Marion MD, Evie Lacks    Palpitations 06/21/2014   PARESTHESIA 07/13/2008   Qualifier: Diagnosis of  By: Alain Marion MD, Evie Lacks    Partial thickness burn of left wrist 09/11/2015   Postoperative state 07/24/2016   Pyelonephritis 2008   Rash 07/27/2017   Stress 2009   Syncope, near 06/30/2013   9/14 likely Rapaflo induced 12/14 relapsing off Rapaflo x 7 2/15 no relapsing since on 1/2 dose of Topamax    Syringobulbia ([place])    SYRINGOMYELIA 08/20/2007   Chronic Dr Krista Blue Dr J[facility]ene Mullet [facility] Chronic pain    Syringomyelia ([place])    Tachycardia    Tonsillitis, chronic 02/26/2017   2018 worse   URINARY TRACT INFECTION (UTI)  03/23/2008   Qualifier: Diagnosis of  By: Alain Marion MD, Evie Lacks    UTI (lower urinary tract infection)    Well adult exam 12/21/2013   We discussed age appropriate health related issues, including available/recomended screening tests and vaccinations. We discussed a need for adhering to healthy diet and exercise. Labs/EKG were reviewed/ordered. All questions were answered.       Past Surgical History:  Procedure Laterality Date   ABDOMINAL HYSTERECTOMY     ANTERIOR AND POSTERIOR REPAIR N/A 07/24/2016   Procedure: Possible ANTERIOR (CYSTOCELE) repair, perineoplasty;  Surgeon: Princess Bruins, MD;  Location: [facility];  Service: Gynecology;  Laterality: N/A;   BLADDER SUSPENSION N/A 07/24/2016   Procedure: TRANSVAGINAL TAPE (TVT) PROCEDURE;  Surgeon: Princess Bruins, MD;  Location: [facility];  Service: Gynecology;  Laterality: N/A;   COLONOSCOPY     CYSTOSCOPY N/A 07/24/2016   Procedure: CYSTOSCOPY;  Surgeon: Princess Bruins, MD;  Location: [facility];  Service: Gynecology;  Laterality: N/A;   FOOT SURGERY Right 2023   intracranial decompression surgery  2006   LEFT HEART CATH AND CORONARY ANGIOGRAPHY N/A 05/07/2018   Procedure: LEFT HEART CATH AND CORONARY ANGIOGRAPHY;  Surgeon: Belva Crome, MD;  Location: [facility];  Service: Cardiovascular;  Laterality: N/A;   PARTIAL KNEE ARTHROPLASTY Left 09/07/2018   Procedure: UNICOMPARTMENTAL LEFT KNEE;  Surgeon: Renette Butters, MD;  Location: [facility];  Service: Orthopedics;  Laterality: Left;  Adductor Block   ROBOTIC ASSISTED LAPAROSCOPIC SACROCOLPOPEXY N/A 07/24/2016   Procedure: ROBOTIC ASSISTED LAPAROSCOPIC SACROCOLPOPEXY WITH PERINEOPLASTY;  Surgeon: Princess Bruins, MD;  Location: [facility];  Service: Gynecology;  Laterality: N/A;   TUBAL LIGATION      Allergies  Allergen Reactions   Atenolol Other (See Comments)    Wt gain   Cefuroxime Axetil Nausea Only and Other (See Comments)    REACTION: nausea - but tolerates PCN    Codeine Sulfate Nausea And Vomiting and Nausea Only   Fentanyl Nausea And Vomiting and Other (See Comments)    REACTION: bad reaction   Imdur [Isosorbide Nitrate] Other (See Comments)    Headaches   Nitrofurantoin Other (See Comments)    Unknown   Pregabalin Other (See Comments)    REACTION: cp   Rapaflo [Silodosin] Other (See Comments)    Orthostatic BP drop, near syncope    Topamax [Topiramate]     Tight chest    Tramadol Other (See Comments)   Tramadol Hcl Nausea And Vomiting    REACTION: sick   Iodinated Contrast Media Rash   Objective: Physical Exam General: The patient is alert and oriented x3 in no acute distress.  Dermatology: Skin is cool, dry and supple bilateral lower extremities. Negative for open lesions  or macerations.  Vascular: Palpable pedal pulses bilaterally. No edema or erythema noted. Capillary refill within normal limits.  Neurological: Grossly intact via light touch  Musculoskeletal Exam: History of bunionectomy right foot.  There is pain with palpation and range of motion to the midtarsal joint specifically around the second and third TMT left foot.  Consistent with mild to moderate arthritic changes and capsulitis  Radiographic exam LT foot 01/12/2023: Normal osseous mineralization.  No acute fractures identified.  There does appear to be some mild to moderate degenerative changes noted to the second and third TMT of the left foot   Assessment: 1. s/p exostectomy right foot. DOS: 09/11/2022 2.  Midfoot capsulitis/DJD left  Plan of Care:  1. Patient was evaluated.  2.  Injection of 0.5 cc Celestone Soluspan injected around the second and third TMT of the left foot 3.  Prescription for Medrol Dosepak 4.  Continue wearing good supportive shoes and sneakers.  Advised against going barefoot 5.  Return to clinic as needed  Edrick Kins, DPM [facility]  Dr. Edrick Kins, DPM    [address], [place] [address]                Office [phone]  Fax [phone]

## 2023-01-15 ENCOUNTER — Telehealth: Payer: Self-pay

## 2023-01-15 NOTE — Telephone Encounter (Signed)
Patient is calling in stating she dropped off something for [place] to write a letter to her insurance to approve the botox.

## 2023-01-21 ENCOUNTER — Other Ambulatory Visit (HOSPITAL_COMMUNITY): Payer: Self-pay

## 2023-01-21 NOTE — Telephone Encounter (Signed)
Prior authorization is not needed at this time. Authorization on file effective until 12-25-2023. Per test claim, this was filled on 12-31-2022 and has a next fill date of 03-09-2023

## 2023-01-24 ENCOUNTER — Other Ambulatory Visit: Payer: Medicare Other

## 2023-01-28 ENCOUNTER — Encounter: Payer: Self-pay | Admitting: Neurology

## 2023-01-29 ENCOUNTER — Telehealth: Payer: Self-pay

## 2023-01-29 NOTE — Telephone Encounter (Signed)
LMOVM per ov 12/20/22, 2.  Migraine rescue:  Roselyn Meier.  She has rizatriptan for second line but hasn't needed it.    Not sure about Nurtec unless that was discussed at her Botox visit 01/02/23.  Dr.Jaffe is out of the office for the next week and half.

## 2023-02-05 ENCOUNTER — Other Ambulatory Visit: Payer: Medicare Other

## 2023-02-11 ENCOUNTER — Telehealth: Payer: Self-pay

## 2023-02-18 ENCOUNTER — Ambulatory Visit (INDEPENDENT_AMBULATORY_CARE_PROVIDER_SITE_OTHER): Payer: Medicare Other | Admitting: Internal Medicine

## 2023-02-18 ENCOUNTER — Other Ambulatory Visit (HOSPITAL_COMMUNITY)
Admission: RE | Admit: 2023-02-18 | Discharge: 2023-02-18 | Disposition: A | Discharge status: Home / Self Care | Payer: Medicare Other | Source: Ambulatory Visit | Attending: Internal Medicine | Admitting: Internal Medicine

## 2023-02-18 ENCOUNTER — Other Ambulatory Visit: Payer: Medicare Other

## 2023-02-18 ENCOUNTER — Other Ambulatory Visit: Payer: Self-pay

## 2023-02-18 VITALS — BP 124/82 | HR 86 | Temp 98.0°F | Ht 61.0 in | Wt 168.0 lb

## 2023-02-18 DIAGNOSIS — D485 Neoplasm of uncertain behavior of skin: Secondary | ICD-10-CM

## 2023-02-18 DIAGNOSIS — R635 Abnormal weight gain: Secondary | ICD-10-CM | POA: Diagnosis not present

## 2023-02-18 DIAGNOSIS — Q8500 Neurofibromatosis, unspecified: Secondary | ICD-10-CM | POA: Diagnosis not present

## 2023-02-18 DIAGNOSIS — D3617 Benign neoplasm of peripheral nerves and autonomic nervous system of trunk, unspecified: Secondary | ICD-10-CM | POA: Diagnosis not present

## 2023-02-18 MED ORDER — PHENTERMINE HCL 37.5 MG PO TABS: 37.5000 mg | ORAL_TABLET | Freq: Every day | ORAL | 2 refills | Status: DC

## 2023-02-18 NOTE — Assessment & Plan Note (Signed)
See procedure 

## 2023-02-18 NOTE — Assessment & Plan Note (Signed)
Wt Readings from Last 3 Encounters:  02/18/23 168 lb (76.2 kg)  01/08/23 152 lb (68.9 kg)  12/24/22 156 lb (70.8 kg)   Start Pentermine Rx  Potential benefits of a long term adipex use as well as potential risks  and complications were explained to the patient and were aknowledged.

## 2023-02-18 NOTE — Patient Instructions (Signed)
Postprocedure instructions :    A Band-Aid should be  changed once or twice daily. You can take a shower tomorrow.  Keep the wounds clean. You can wash them with liquid soap and water. Pat dry with gauze or a Kleenex tissue before applying antibiotic ointment and a Band-Aid.   You need to report immediately  if fever, chills or any signs of infection develop.    The biopsy results should be available in 1 -2 weeks.   

## 2023-02-18 NOTE — Addendum Note (Signed)
Addended by: Aundra Millet on: 02/18/2023 10:02 AM   Modules accepted: Orders

## 2023-02-18 NOTE — Progress Notes (Signed)
Subjective:  Patient ID: Beth Huffman, female    DOB: 06-01-1961  Age: 62 y.o. MRN: [id number]  CC: No chief complaint on file.   HPI JAMESHA ELLSWORTH presents for a mole C/o wt gain  Outpatient Medications Prior to Visit  Medication Sig Dispense Refill   albuterol (VENTOLIN HFA) 108 (90 Base) MCG/ACT inhaler Inhale 2 puffs into the lungs every 6 (six) hours as needed for wheezing or shortness of breath. 18 g 2   azelastine (OPTIVAR) 0.05 % ophthalmic solution Apply 1 drop to eye 2 (two) times daily.     botulinum toxin Type A (BOTOX) 200 units injection Inject 155 units IM into multiple site in the face,neck and head once every 90 days 1 each 4   cholecalciferol (VITAMIN D) 1000 UNITS tablet Take 1,000 Units by mouth daily.     cyclobenzaprine (FLEXERIL) 5 MG tablet TAKE 1 TABLET THREE TIMES DAILY AS NEEDED FOR MUSCLE SPASMS. 90 tablet 3   diltiazem (CARDIZEM CD) 120 MG 24 hr capsule Take 1 capsule (120 mg total) by mouth daily. 90 capsule 3   estradiol (ESTRACE) 0.5 MG tablet Take 1 tablet (0.5 mg total) by mouth daily. 90 tablet 4   famotidine (PEPCID) 40 MG tablet Take 1 tablet (40 mg total) by mouth daily. 90 tablet 3   gabapentin (NEURONTIN) 100 MG capsule Take 1 capsule (100 mg total) by mouth 3 (three) times daily. 90 capsule 3   Galcanezumab-gnlm (EMGALITY) 120 MG/ML SOAJ Inject 120 mg into the skin every 30 (thirty) days. Loading 1st dose is 240 mg. For migraine prophylaxis 2 mL 11   methylPREDNISolone (MEDROL DOSEPAK) 4 MG TBPK tablet 6 day dose pack - take as directed 21 tablet 0   Multiple Vitamin (MULTIVITAMIN PO) Take 1 tablet by mouth daily.     MYRBETRIQ 50 MG TB24 tablet Take 50 mg by mouth daily.     naproxen (NAPROSYN) 500 MG tablet Take 1 tablet (500 mg total) by mouth 2 (two) times daily with a meal. 60 tablet 3   Oxycodone HCl 20 MG TABS Take 0.5 tablets (10 mg total) by mouth 4 (four) times daily as needed. 60 tablet 0   Oxycodone HCl 20 MG TABS Take 0.5  tablets (10 mg total) by mouth 4 (four) times daily as needed. 60 tablet 0   Oxycodone HCl 20 MG TABS Take 0.5 tablets (10 mg total) by mouth 4 (four) times daily as needed. 60 tablet 0   pantoprazole (PROTONIX) 40 MG tablet Take 1 tablet (40 mg total) by mouth at bedtime. 90 tablet 3   polyethylene glycol (MIRALAX / GLYCOLAX) packet Take 8.5 g by mouth every other day.     Rimegepant Sulfate (NURTEC) 75 MG TBDP 1 po qod prn 12 tablet 3   rizatriptan (MAXALT) 10 MG tablet TAKE 1 TABLET AT ONSET OF MIGRAINE- MAY REPEAT ONCE IN 2 HOURS. LIMIT 2/24 HOURS. AVOID DAILY USE. 8 tablet 3   senna (SENOKOT) 8.6 MG tablet Take 1 tablet by mouth as needed for constipation.     Turmeric (QC TUMERIC COMPLEX) 500 MG CAPS Take by mouth.     Ubrogepant (UBRELVY) 100 MG TABS Take 0.1 tablets (10 mg total) by mouth as needed (May repeat after 2 hours.  Maximum 2 tablets in 24 hours.). 16 tablet 11   No facility-administered medications prior to visit.    ROS: Review of Systems  Constitutional:  Positive for unexpected weight change.  Cardiovascular:  Negative for chest  pain and palpitations.  Musculoskeletal:  Positive for back pain.    Objective:  BP 124/82 (BP Location: Left Arm, Patient Position: Sitting, Cuff Size: Normal)   Pulse 86   Temp 98 F (36.7 C) (Oral)   Ht 5\' 1"  (1.549 m)   Wt 168 lb (76.2 kg)   SpO2 97%   BMI 31.74 kg/m   BP Readings from Last 3 Encounters:  02/18/23 124/82  01/08/23 126/84  12/24/22 124/78    Wt Readings from Last 3 Encounters:  02/18/23 168 lb (76.2 kg)  01/08/23 152 lb (68.9 kg)  12/24/22 156 lb (70.8 kg)    Physical Exam Constitutional:      General: She is not in acute distress.    Appearance: She is well-developed. She is obese.  HENT:     Head: Normocephalic.     Right Ear: External ear normal.     Left Ear: External ear normal.     Nose: Nose normal.  Eyes:     General:        Right eye: No discharge.        Left eye: No discharge.      Conjunctiva/sclera: Conjunctivae normal.     Pupils: Pupils are equal, round, and reactive to light.  Neck:     Thyroid: No thyromegaly.     Vascular: No JVD.     Trachea: No tracheal deviation.  Cardiovascular:     Rate and Rhythm: Normal rate and regular rhythm.     Heart sounds: Normal heart sounds.  Pulmonary:     Effort: No respiratory distress.     Breath sounds: No stridor. No wheezing.  Abdominal:     General: Bowel sounds are normal. There is no distension.     Palpations: Abdomen is soft. There is no mass.     Tenderness: There is no abdominal tenderness. There is no guarding or rebound.  Musculoskeletal:        General: No tenderness.     Cervical back: Normal range of motion and neck supple. No rigidity.  Lymphadenopathy:     Cervical: No cervical adenopathy.  Skin:    Findings: No erythema or rash.  Neurological:     Cranial Nerves: No cranial nerve deficit.     Motor: No abnormal muscle tone.     Coordination: Coordination normal.     Deep Tendon Reflexes: Reflexes normal.  Psychiatric:        Behavior: Behavior normal.        Thought Content: Thought content normal.        Judgment: Judgment normal.    Mole on LS spine 7x5 mm, round   Procedure Note :     Procedure :  Skin biopsy   Indication:  Changing mole (s ),  Suspicious lesion(s)   Risks including unsuccessful procedure , bleeding, infection, bruising, scar, a need for another complete procedure and others were explained to the patient in detail as well as the benefits. Informed consent was obtained verbally.  The patient was placed in a decubitus position.  Lesion #1 on low back    measuring  7x5   mm   Skin over lesion #1  was prepped with Betadine and alcohol  and anesthetized with 1 cc of 2% lidocaine and epinephrine, using a 25-gauge 1 inch needle.  Shave biopsy with a sterile Dermablade was carried out in the usual fashion. Hyfrecator was used to destroy the rest of the lesion potentially  left behind and for  hemostasis. Band-Aid was applied with antibiotic ointment.      Tolerated well. Complications none.      Postprocedure instructions :    A Band-Aid should be  changed once or twice daily. You can take a shower tomorrow.  Keep the wounds clean. You can wash them with liquid soap and water. Pat dry with gauze or a Kleenex tissue before applying antibiotic ointment and a Band-Aid.   You need to report immediately  if fever, chills or any signs of infection develop.    The biopsy results should be available in 1 -2 weeks.  Lab Results  Component Value Date   WBC 5.2 04/25/2021   HGB 13.3 04/25/2021   HCT 39.6 04/25/2021   PLT 210.0 04/25/2021   GLUCOSE 84 04/04/2022   CHOL 225 (H) 04/25/2021   TRIG 110.0 04/25/2021   HDL 70.00 04/25/2021   LDLDIRECT 130.2 12/21/2013   LDLCALC 133 (H) 04/25/2021   ALT 27 04/25/2021   AST 25 04/25/2021   NA 138 04/04/2022   K 4.6 04/04/2022   CL 102 04/04/2022   CREATININE 0.79 04/04/2022   BUN 12 04/04/2022   CO2 26 04/04/2022   TSH 1.81 04/25/2021   HGBA1C 5.5 03/31/2018    No results found.  Assessment & Plan:   Problem List Items Addressed This Visit       Musculoskeletal and Integument   Neoplasm of uncertain behavior of skin    See procedure      Relevant Orders   Surgical pathology     Other   Weight gain - Primary    Wt Readings from Last 3 Encounters:  02/18/23 168 lb (76.2 kg)  01/08/23 152 lb (68.9 kg)  12/24/22 156 lb (70.8 kg)  Start Pentermine Rx  Potential benefits of a long term adipex use as well as potential risks  and complications were explained to the patient and were aknowledged.         Meds ordered this encounter  Medications   phentermine (ADIPEX-P) 37.5 MG tablet    Sig: Take 1 tablet (37.5 mg total) by mouth daily before breakfast.    Dispense:  30 tablet    Refill:  2      Follow-up: No follow-ups on file.  Sonda Primes, MD

## 2023-02-19 LAB — SURGICAL PATHOLOGY

## 2023-02-24 DIAGNOSIS — N8111 Cystocele, midline: Secondary | ICD-10-CM | POA: Diagnosis not present

## 2023-02-25 ENCOUNTER — Telehealth: Payer: Self-pay | Admitting: Internal Medicine

## 2023-02-25 ENCOUNTER — Telehealth: Payer: Self-pay

## 2023-02-25 NOTE — Telephone Encounter (Signed)
Is patient using Nurtec or Ubrelvy for acute treatment?

## 2023-02-25 NOTE — Telephone Encounter (Signed)
Thank you! PA has been submitted, will be updated in additional encounter created.

## 2023-02-25 NOTE — Telephone Encounter (Signed)
PA request received via provider/CMM for Ubrelvy 100MG  tablets  PA has been submitted to [facility] and is pending additional questions/determination  Key: [id number]

## 2023-02-25 NOTE — Telephone Encounter (Signed)
Contacted [name] to schedule their annual wellness visit. Appointment made for 03/16/2023.  [facility] Care Guide [facility] AWV TEAM Direct Dial: [phone]

## 2023-03-03 ENCOUNTER — Other Ambulatory Visit: Payer: Self-pay | Admitting: Neurology

## 2023-03-03 ENCOUNTER — Encounter: Payer: Self-pay | Admitting: Neurology

## 2023-03-03 ENCOUNTER — Telehealth: Payer: Self-pay

## 2023-03-03 ENCOUNTER — Other Ambulatory Visit (HOSPITAL_COMMUNITY): Payer: Self-pay

## 2023-03-03 MED ORDER — AIMOVIG 140 MG/ML ~~LOC~~ SOAJ: 140.0000 mg | SUBCUTANEOUS | 11 refills | Status: DC

## 2023-03-03 NOTE — Telephone Encounter (Signed)
Spoke to the patient, she will like to go on a injection monthly method for her Migraines.   Advised patient the Montey Hora PA is waiting on response.   Patient wanted to know if she can get a sample of Ubrelvey while she waits on the PA. And to have PA done for the Aimovig since she was on that already.   Please advise

## 2023-03-03 NOTE — Telephone Encounter (Signed)
Patient upset waiting on PA for urbelvy for a while.  Message the PA team to check the status of the PA.

## 2023-03-03 NOTE — Telephone Encounter (Signed)
PA submitted in other encounter

## 2023-03-03 NOTE — Telephone Encounter (Signed)
Patient advised to pick up samples of ubrelvy.

## 2023-03-04 ENCOUNTER — Telehealth: Payer: Self-pay | Admitting: Pharmacy Technician

## 2023-03-04 NOTE — Telephone Encounter (Signed)
Patient Advocate Encounter  Received notification from [facility] that prior authorization for UBRELVY 100MG  is required.   PA submitted on 5.8.24 Key [id number] Status is pending

## 2023-03-04 NOTE — Telephone Encounter (Signed)
[employer] called Returning a call [phone], opt 5 need more information

## 2023-03-04 NOTE — Telephone Encounter (Signed)
Patient Advocate Encounter  Received notification from [facility] that prior authorization for AIMOVIG 140MG  is required.   PA submitted on 5.8.24 Key [id number] Status is pending

## 2023-03-05 ENCOUNTER — Telehealth: Payer: Self-pay

## 2023-03-05 ENCOUNTER — Other Ambulatory Visit: Payer: Self-pay

## 2023-03-05 NOTE — Telephone Encounter (Signed)
Botox order D/c with [facility].

## 2023-03-06 ENCOUNTER — Encounter: Payer: Self-pay | Admitting: Obstetrics & Gynecology

## 2023-03-06 NOTE — Telephone Encounter (Signed)
Message received from Patient health team advantage, PA denied, Per rep will not cover if it is taken with another Acute medication or CGRP.

## 2023-03-09 ENCOUNTER — Encounter: Payer: Self-pay | Admitting: Obstetrics & Gynecology

## 2023-03-09 ENCOUNTER — Ambulatory Visit (INDEPENDENT_AMBULATORY_CARE_PROVIDER_SITE_OTHER): Payer: Medicare Other | Admitting: Obstetrics & Gynecology

## 2023-03-09 VITALS — BP 142/86 | Wt 168.0 lb

## 2023-03-09 DIAGNOSIS — Z90710 Acquired absence of both cervix and uterus: Secondary | ICD-10-CM | POA: Diagnosis not present

## 2023-03-09 DIAGNOSIS — Z79890 Hormone replacement therapy (postmenopausal): Secondary | ICD-10-CM | POA: Diagnosis not present

## 2023-03-09 DIAGNOSIS — Z01419 Encounter for gynecological examination (general) (routine) without abnormal findings: Secondary | ICD-10-CM

## 2023-03-09 MED ORDER — ESTRADIOL 0.5 MG PO TABS: 0.5000 mg | ORAL_TABLET | Freq: Every day | ORAL | 4 refills | Status: DC

## 2023-03-09 NOTE — Progress Notes (Signed)
Beth Huffman Dec 01, 1960 [id number]   History:    62 y.o.  G2P2L2 Married.  Daughter 57 yo, son 98 yo.  3 grand-children.   RP:  Postmenopausal management of HRT and Breast/pelvic exam   HPI: Postmenopause, well on Estradiol 0.5 mg tab daily currently x about 9 yrs.  No Menopausal Sx.  S/P Vaginal Hyst in 90's and Sacrocolpopexy/BSO/Ant Repair/Perineoplasty/TVT 06/2016.  Urinary urgency stable.  No pelvic pain.  Pap Neg 01/2020. Repeat at 4 yrs.  Breasts wnl. Mammo Neg 08/2021.  [facility]. BMI 31.74.  Had Rt foot surgery x 2. Will restart Swimming.  Health labs with Fam MD.  COLONOSCOPY: 10-22-16, Cologard Neg 09-21-19, will repeat Cologuard now.   Past medical history,surgical history, family history and social history were all reviewed and documented in the EPIC chart.  Gynecologic History No LMP recorded. Patient has had a hysterectomy.  Obstetric History OB History  Gravida Para Term Preterm AB Living  2 2       2   SAB IAB Ectopic Multiple Live Births               # Outcome Date GA Lbr Len/2nd Weight Sex Delivery Anes PTL Lv  2 Para           1 Para              ROS: A ROS was performed and pertinent positives and negatives are included in the history. GENERAL: No fevers or chills. HEENT: No change in vision, no earache, sore throat or sinus congestion. NECK: No pain or stiffness. CARDIOVASCULAR: No chest pain or pressure. No palpitations. PULMONARY: No shortness of breath, cough or wheeze. GASTROINTESTINAL: No abdominal pain, nausea, vomiting or diarrhea, melena or bright red blood per rectum. GENITOURINARY: No urinary frequency, urgency, hesitancy or dysuria. MUSCULOSKELETAL: No joint or muscle pain, no back pain, no recent trauma. DERMATOLOGIC: No rash, no itching, no lesions. ENDOCRINE: No polyuria, polydipsia, no heat or cold intolerance. No recent change in weight. HEMATOLOGICAL: No anemia or easy bruising or bleeding. NEUROLOGIC: No  headache, seizures, numbness, tingling or weakness. PSYCHIATRIC: No depression, no loss of interest in normal activity or change in sleep pattern.     Exam:   BP (!) 142/86 (BP Location: Left Arm, Patient Position: Sitting)   Wt 168 lb (76.2 kg) Comment: Patient reports  BMI 31.74 kg/m   Body mass index is 31.74 kg/m.  General appearance : Well developed well nourished female. No acute distress HEENT: Eyes: no retinal hemorrhage or exudates,  Neck supple, trachea midline, no carotid bruits, no thyroidmegaly Lungs: Clear to auscultation, no rhonchi or wheezes, or rib retractions  Heart: Regular rate and rhythm, no murmurs or gallops Breast:Examined in sitting and supine position were symmetrical in appearance, no palpable masses or tenderness,  no skin retraction, no nipple inversion, no nipple discharge, no skin discoloration, no axillary or supraclavicular lymphadenopathy Abdomen: no palpable masses or tenderness, no rebound or guarding Extremities: no edema or skin discoloration or tenderness  Pelvic: Vulva: Normal             Vagina: No gross lesions or discharge  Cervix/Uterus absent  Adnexa  Without masses or tenderness  Anus: Normal   Assessment/Plan:  62 y.o. female for annual exam   1. Postmenopausal hormone replacement therapy Postmenopause, well on Estradiol 0.5 mg tab daily currently x about 9 yrs.  No Menopausal Sx.  Rt lower limb tends to swell  post foot surgery x 2.  Recommend Rt lower limb venous doppler if Rt leg stays swollen in am.  Stop Estradiol if suspicion of DVT.  2. S/P total hysterectomy  3. Well female exam with routine gynecological exam Postmenopause, well on Estradiol 0.5 mg tab daily currently x about 9 yrs.  No Menopausal Sx.  S/P Vaginal Hyst in 90's and Sacrocolpopexy/BSO/Ant Repair/Perineoplasty/TVT 06/2016.  Urinary urgency stable.  No pelvic pain.  Pap Neg 01/2020. Repeat at 4 yrs.  Breasts wnl. Mammo Neg 08/2021.  [facility] BMI 31.74.  Had Rt foot surgery x 2. Will restart Swimming.  Health labs with Fam MD.  COLONOSCOPY: 10-22-16, Cologard Neg 09-21-19, will repeat Cologuard now.  Other orders - estradiol (ESTRACE) 0.5 MG tablet; Take 1 tablet (0.5 mg total) by mouth daily.   Genia Del MD, 3:05 PM

## 2023-03-10 ENCOUNTER — Other Ambulatory Visit (HOSPITAL_COMMUNITY): Payer: Self-pay

## 2023-03-10 ENCOUNTER — Encounter: Payer: Self-pay | Admitting: Internal Medicine

## 2023-03-11 NOTE — Telephone Encounter (Signed)
Patient states this medication is not at the pharmacy - she wants to know when you will send it in.

## 2023-03-11 NOTE — Telephone Encounter (Signed)
Faxed back to [facility] " Ok to fill 03/13/23. MD oK. Prescription is already there its a early refill due to pt going out of town.Marland KitchenRaechel Chute

## 2023-03-16 NOTE — Telephone Encounter (Signed)
Denied. We denied this request under Medicare Part D because Ubrelvy did not meet Beth Huffman Prior Authorization Criteria requirements. Prior Authorization requires members to meet certain criteria set by the plan before a drug is covered. Beth Huffman is approved when the member is not currently being treated with another acute migraine medication (including triptans, 5HT-77F medications, ergotamine, or CGRP medications). In this case, the provider stated that another acute migraine medication will be used with Beth Huffman.

## 2023-03-24 ENCOUNTER — Other Ambulatory Visit: Payer: Self-pay | Admitting: Internal Medicine

## 2023-03-27 ENCOUNTER — Other Ambulatory Visit (HOSPITAL_COMMUNITY): Payer: Self-pay

## 2023-03-27 NOTE — Telephone Encounter (Signed)
Patient Advocate Encounter  Received notification from [facility] that prior authorization for UBRELVY 100MG  is required.   PA submitted on 5.31.24 Key [id number] Status is pending

## 2023-03-31 ENCOUNTER — Ambulatory Visit
Admission: RE | Admit: 2023-03-31 | Discharge: 2023-03-31 | Disposition: A | Discharge status: Home / Self Care | Payer: Medicare Other | Source: Ambulatory Visit | Attending: Obstetrics & Gynecology | Admitting: Obstetrics & Gynecology

## 2023-03-31 DIAGNOSIS — N6310 Unspecified lump in the right breast, unspecified quadrant: Secondary | ICD-10-CM

## 2023-03-31 DIAGNOSIS — R92333 Mammographic heterogeneous density, bilateral breasts: Secondary | ICD-10-CM | POA: Diagnosis not present

## 2023-03-31 DIAGNOSIS — R92331 Mammographic heterogeneous density, right breast: Secondary | ICD-10-CM | POA: Diagnosis not present

## 2023-04-02 ENCOUNTER — Encounter: Payer: Self-pay | Admitting: Internal Medicine

## 2023-04-02 ENCOUNTER — Other Ambulatory Visit (HOSPITAL_COMMUNITY): Payer: Self-pay

## 2023-04-02 ENCOUNTER — Ambulatory Visit (INDEPENDENT_AMBULATORY_CARE_PROVIDER_SITE_OTHER): Payer: Medicare Other | Admitting: Internal Medicine

## 2023-04-02 VITALS — BP 118/82 | HR 80 | Temp 98.1°F | Ht 61.0 in | Wt 158.0 lb

## 2023-04-02 DIAGNOSIS — M5441 Lumbago with sciatica, right side: Secondary | ICD-10-CM | POA: Diagnosis not present

## 2023-04-02 DIAGNOSIS — G8929 Other chronic pain: Secondary | ICD-10-CM

## 2023-04-02 DIAGNOSIS — M5442 Lumbago with sciatica, left side: Secondary | ICD-10-CM

## 2023-04-02 DIAGNOSIS — G43709 Chronic migraine without aura, not intractable, without status migrainosus: Secondary | ICD-10-CM | POA: Diagnosis not present

## 2023-04-02 DIAGNOSIS — G950 Syringomyelia and syringobulbia: Secondary | ICD-10-CM | POA: Diagnosis not present

## 2023-04-02 DIAGNOSIS — E785 Hyperlipidemia, unspecified: Secondary | ICD-10-CM

## 2023-04-02 MED ORDER — OXYCODONE HCL 20 MG PO TABS: 10.0000 mg | ORAL_TABLET | Freq: Four times a day (QID) | ORAL | 0 refills | Status: DC | PRN

## 2023-04-02 MED ORDER — AMOXICILLIN-POT CLAVULANATE 875-125 MG PO TABS
1.0000 | ORAL_TABLET | Freq: Two times a day (BID) | ORAL | 0 refills | Status: DC
Start: 2023-04-02 — End: 2023-07-06

## 2023-04-02 MED ORDER — PHENTERMINE HCL 37.5 MG PO TABS: 37.5000 mg | ORAL_TABLET | Freq: Every day | ORAL | 2 refills | Status: DC

## 2023-04-02 MED ORDER — METHYLPREDNISOLONE 4 MG PO TBPK
ORAL_TABLET | ORAL | 0 refills | Status: DC
Start: 2023-04-02 — End: 2023-07-06

## 2023-04-02 MED ORDER — OXYMETAZOLINE HCL 0.05 % NA SOLN: 1.0000 | Freq: Two times a day (BID) | NASAL | 0 refills | Status: AC

## 2023-04-02 NOTE — Progress Notes (Signed)
Subjective:  Patient ID: Beth Huffman, female    DOB: Aug 17, 1961  Age: 62 y.o. MRN: [id number]  CC: Leg Pain   HPI YARIS KIBE presents for LBP C/o humming in the L ear  Outpatient Medications Prior to Visit  Medication Sig Dispense Refill   albuterol (VENTOLIN HFA) 108 (90 Base) MCG/ACT inhaler Inhale 2 puffs into the lungs every 6 (six) hours as needed for wheezing or shortness of breath. 18 g 2   azelastine (OPTIVAR) 0.05 % ophthalmic solution Apply 1 drop to eye 2 (two) times daily.     cholecalciferol (VITAMIN D) 1000 UNITS tablet Take 1,000 Units by mouth daily.     diltiazem (CARDIZEM CD) 120 MG 24 hr capsule Take 1 capsule (120 mg total) by mouth daily. 90 capsule 3   estradiol (ESTRACE) 0.5 MG tablet Take 1 tablet (0.5 mg total) by mouth daily. 90 tablet 4   famotidine (PEPCID) 40 MG tablet Take 1 tablet (40 mg total) by mouth daily. 90 tablet 3   gabapentin (NEURONTIN) 100 MG capsule Take 1 capsule (100 mg total) by mouth 3 (three) times daily. 90 capsule 3   Multiple Vitamin (MULTIVITAMIN PO) Take 1 tablet by mouth daily.     MYRBETRIQ 50 MG TB24 tablet Take 50 mg by mouth daily.     naproxen (NAPROSYN) 500 MG tablet Take 1 tablet (500 mg total) by mouth 2 (two) times daily with a meal. 60 tablet 3   pantoprazole (PROTONIX) 40 MG tablet Take 1 tablet (40 mg total) by mouth at bedtime. 90 tablet 3   polyethylene glycol (MIRALAX / GLYCOLAX) packet Take 8.5 g by mouth every other day.     rizatriptan (MAXALT) 10 MG tablet TAKE 1 TABLET AT ONSET OF MIGRAINE- MAY REPEAT ONCE IN 2 HOURS. LIMIT 2/24 HOURS. AVOID DAILY USE. 8 tablet 3   senna (SENOKOT) 8.6 MG tablet Take 1 tablet by mouth as needed for constipation.     Turmeric (QC TUMERIC COMPLEX) 500 MG CAPS Take by mouth.     Oxycodone HCl 20 MG TABS Take 0.5 tablets (10 mg total) by mouth 4 (four) times daily as needed. 60 tablet 0   Oxycodone HCl 20 MG TABS Take 0.5 tablets (10 mg total) by mouth 4 (four) times daily  as needed. 60 tablet 0   Oxycodone HCl 20 MG TABS Take 0.5 tablets (10 mg total) by mouth 4 (four) times daily as needed. 60 tablet 0   Erenumab-aooe (AIMOVIG) 140 MG/ML SOAJ Inject 140 mg into the skin every 28 (twenty-eight) days. (Patient not taking: Reported on 03/09/2023) 1.12 mL 11   Rimegepant Sulfate (NURTEC) 75 MG TBDP 1 po qod prn (Patient not taking: Reported on 03/09/2023) 12 tablet 3   Ubrogepant (UBRELVY) 100 MG TABS Take 0.1 tablets (10 mg total) by mouth as needed (May repeat after 2 hours.  Maximum 2 tablets in 24 hours.). (Patient not taking: Reported on 03/09/2023) 16 tablet 11   phentermine (ADIPEX-P) 37.5 MG tablet Take 1 tablet (37.5 mg total) by mouth daily before breakfast. 30 tablet 2   No facility-administered medications prior to visit.    ROS: Review of Systems  Constitutional:  Negative for activity change, appetite change, chills, fatigue and unexpected weight change.  HENT:  Positive for congestion and tinnitus. Negative for mouth sores and sinus pressure.   Eyes:  Negative for visual disturbance.  Respiratory:  Negative for cough and chest tightness.   Gastrointestinal:  Negative for abdominal pain  and nausea.  Genitourinary:  Negative for difficulty urinating, frequency and vaginal pain.  Musculoskeletal:  Positive for arthralgias, back pain and neck pain. Negative for gait problem.  Skin:  Negative for pallor and rash.  Neurological:  Negative for dizziness, tremors, weakness, numbness and headaches.  Psychiatric/Behavioral:  Negative for confusion, sleep disturbance and suicidal ideas.     Objective:  BP 118/82 (BP Location: Left Arm, Patient Position: Sitting, Cuff Size: Large)   Pulse 80   Temp 98.1 F (36.7 C) (Oral)   Ht 5\' 1"  (1.549 m)   Wt 158 lb (71.7 kg)   SpO2 97%   BMI 29.85 kg/m   BP Readings from Last 3 Encounters:  04/02/23 118/82  03/09/23 (!) 142/86  02/18/23 124/82    Wt Readings from Last 3 Encounters:  04/02/23 158 lb (71.7  kg)  03/09/23 168 lb (76.2 kg)  02/18/23 168 lb (76.2 kg)    Physical Exam Constitutional:      General: She is not in acute distress.    Appearance: She is well-developed.  HENT:     Head: Normocephalic.     Right Ear: External ear normal.     Left Ear: External ear normal.     Nose: Nose normal.  Eyes:     General:        Right eye: No discharge.        Left eye: No discharge.     Conjunctiva/sclera: Conjunctivae normal.     Pupils: Pupils are equal, round, and reactive to light.  Neck:     Thyroid: No thyromegaly.     Vascular: No JVD.     Trachea: No tracheal deviation.  Cardiovascular:     Rate and Rhythm: Normal rate and regular rhythm.     Heart sounds: Normal heart sounds.  Pulmonary:     Effort: No respiratory distress.     Breath sounds: No stridor. No wheezing.  Abdominal:     General: Bowel sounds are normal. There is no distension.     Palpations: Abdomen is soft. There is no mass.     Tenderness: There is no abdominal tenderness. There is no guarding or rebound.  Musculoskeletal:        General: No tenderness.     Cervical back: Normal range of motion and neck supple. No rigidity.  Lymphadenopathy:     Cervical: No cervical adenopathy.  Skin:    Findings: No erythema or rash.  Neurological:     Cranial Nerves: No cranial nerve deficit.     Motor: No abnormal muscle tone.     Coordination: Coordination normal.     Deep Tendon Reflexes: Reflexes normal.  Psychiatric:        Behavior: Behavior normal.        Thought Content: Thought content normal.        Judgment: Judgment normal.   R 3d MCP w/swelling  Lab Results  Component Value Date   WBC 5.2 04/25/2021   HGB 13.3 04/25/2021   HCT 39.6 04/25/2021   PLT 210.0 04/25/2021   GLUCOSE 84 04/04/2022   CHOL 225 (H) 04/25/2021   TRIG 110.0 04/25/2021   HDL 70.00 04/25/2021   LDLDIRECT 130.2 12/21/2013   LDLCALC 133 (H) 04/25/2021   ALT 27 04/25/2021   AST 25 04/25/2021   NA 138 04/04/2022   K  4.6 04/04/2022   CL 102 04/04/2022   CREATININE 0.79 04/04/2022   BUN 12 04/04/2022   CO2 26 04/04/2022  TSH 1.81 04/25/2021   HGBA1C 5.5 03/31/2018    MM 3D DIAGNOSTIC MAMMOGRAM BILATERAL BREAST  Result Date: 03/31/2023 CLINICAL DATA:  62 year old female presenting for evaluation of a new lump in the inferior right breast and for annual exam of the left breast. EXAM: DIGITAL DIAGNOSTIC BILATERAL MAMMOGRAM WITH TOMOSYNTHESIS; ULTRASOUND RIGHT BREAST LIMITED TECHNIQUE: Bilateral digital diagnostic mammography and breast tomosynthesis was performed.; Targeted ultrasound examination of the right breast was performed COMPARISON:  Previous exam(s). ACR Breast Density Category c: The breasts are heterogeneously dense, which may obscure small masses. FINDINGS: Mammogram: Right breast: A skin BB marks the palpable site of concern reported by the patient in the central inferior right breast. Spot tangential view of this area was performed in addition to standard views. There is no new abnormality at the palpable site or elsewhere in the right breast is suggested presence of malignancy. Left breast: No suspicious mass, distortion, or microcalcifications are identified to suggest presence of malignancy. On physical exam at the site of concern reported by the patient in the central inferior right breast I do not feel a discrete mass or focal area of thickening. Ultrasound: Targeted ultrasound performed at the palpable site of concern in the right breast at 7 o'clock 6 cm from the nipple demonstrating no cystic or solid mass. IMPRESSION: 1. No mammographic or sonographic evidence of malignancy at the palpable site of concern in the right breast. 2. No mammographic evidence of malignancy in the left breast. RECOMMENDATION: 1. Recommend any further workup of the palpable site in the right breast be on a clinical basis given negative imaging findings. 2.  Screening mammogram in one year.(Code:SM-B-01Y) I have discussed  the findings and recommendations with the patient. If applicable, a reminder letter will be sent to the patient regarding the next appointment. BI-RADS CATEGORY  1: Negative. Electronically Signed   By: Emmaline Kluver M.D.   On: 03/31/2023 09:26   [place] LIMITED ULTRASOUND INCLUDING AXILLA RIGHT BREAST  Result Date: 03/31/2023 CLINICAL DATA:  62 year old female presenting for evaluation of a new lump in the inferior right breast and for annual exam of the left breast. EXAM: DIGITAL DIAGNOSTIC BILATERAL MAMMOGRAM WITH TOMOSYNTHESIS; ULTRASOUND RIGHT BREAST LIMITED TECHNIQUE: Bilateral digital diagnostic mammography and breast tomosynthesis was performed.; Targeted ultrasound examination of the right breast was performed COMPARISON:  Previous exam(s). ACR Breast Density Category c: The breasts are heterogeneously dense, which may obscure small masses. FINDINGS: Mammogram: Right breast: A skin BB marks the palpable site of concern reported by the patient in the central inferior right breast. Spot tangential view of this area was performed in addition to standard views. There is no new abnormality at the palpable site or elsewhere in the right breast is suggested presence of malignancy. Left breast: No suspicious mass, distortion, or microcalcifications are identified to suggest presence of malignancy. On physical exam at the site of concern reported by the patient in the central inferior right breast I do not feel a discrete mass or focal area of thickening. Ultrasound: Targeted ultrasound performed at the palpable site of concern in the right breast at 7 o'clock 6 cm from the nipple demonstrating no cystic or solid mass. IMPRESSION: 1. No mammographic or sonographic evidence of malignancy at the palpable site of concern in the right breast. 2. No mammographic evidence of malignancy in the left breast. RECOMMENDATION: 1. Recommend any further workup of the palpable site in the right breast be on a clinical basis  given negative imaging findings. 2.  Screening  mammogram in one year.(Code:SM-B-01Y) I have discussed the findings and recommendations with the patient. If applicable, a reminder letter will be sent to the patient regarding the next appointment. BI-RADS CATEGORY  1: Negative. Electronically Signed   By: Emmaline Kluver M.D.   On: 03/31/2023 09:26   Assessment & Plan:   Problem List Items Addressed This Visit     SYRINGOMYELIA    Seems to be stable.  No new symptoms      Migraine headache    F/u w/Dr Everlena Cooper Refractory migraines (Nurtec is covered).        Relevant Medications   Oxycodone HCl 20 MG TABS   Oxycodone HCl 20 MG TABS   Oxycodone HCl 20 MG TABS   LOW BACK PAIN - Primary    Chronic, lately (2016) disabling Due to syringomyelia On Oxycodone  Potential benefits of a long term opioids use as well as potential risks (i.e. addiction risk, apnea etc) and complications (i.e. Somnolence, constipation and others) were explained to the patient and were aknowledged.      Relevant Medications   methylPREDNISolone (MEDROL DOSEPAK) 4 MG TBPK tablet   Oxycodone HCl 20 MG TABS   Oxycodone HCl 20 MG TABS   Oxycodone HCl 20 MG TABS   Dyslipidemia    Dyslipidemia Diet, exercise weight loss Patient declined statins in the past         Meds ordered this encounter  Medications   amoxicillin-clavulanate (AUGMENTIN) 875-125 MG tablet    Sig: Take 1 tablet by mouth 2 (two) times daily.    Dispense:  20 tablet    Refill:  0   methylPREDNISolone (MEDROL DOSEPAK) 4 MG TBPK tablet    Sig: As directed    Dispense:  21 tablet    Refill:  0   oxymetazoline (AFRIN NASAL SPRAY) 0.05 % nasal spray    Sig: Place 1 spray into both nostrils 2 (two) times daily.    Dispense:  30 mL    Refill:  0   Oxycodone HCl 20 MG TABS    Sig: Take 0.5 tablets (10 mg total) by mouth 4 (four) times daily as needed.    Dispense:  60 tablet    Refill:  0    Please fill on or after 04/15/23   Oxycodone  HCl 20 MG TABS    Sig: Take 0.5 tablets (10 mg total) by mouth 4 (four) times daily as needed.    Dispense:  60 tablet    Refill:  0    Please fill on or after 05/15/23   Oxycodone HCl 20 MG TABS    Sig: Take 0.5 tablets (10 mg total) by mouth 4 (four) times daily as needed.    Dispense:  60 tablet    Refill:  0    Please fill on or after 06/14/23   phentermine (ADIPEX-P) 37.5 MG tablet    Sig: Take 1 tablet (37.5 mg total) by mouth daily before breakfast.    Dispense:  30 tablet    Refill:  2      Follow-up: Return in about 3 months (around 07/03/2023) for a follow-up visit.  Sonda Primes, MD

## 2023-04-02 NOTE — Assessment & Plan Note (Signed)
Dyslipidemia Diet, exercise weight loss Patient declined statins in the past 

## 2023-04-02 NOTE — Assessment & Plan Note (Signed)
  Chronic, lately (2016) disabling Due to syringomyelia On Oxycodone  Potential benefits of a long term opioids use as well as potential risks (i.e. addiction risk, apnea etc) and complications (i.e. Somnolence, constipation and others) were explained to the patient and were aknowledged.  

## 2023-04-02 NOTE — Telephone Encounter (Signed)
Patient Advocate Encounter  Prior authorization for Ubrelvy 100MG  tablets has been cancelled by [employer] due to previous denial.   Per insurance:  New coverage determination cannot be requested until day 61 following the previous denial decision.   Appeal info:   Fax: [phone] Phone: [phone]  Please note we do not currently have a pharmacist available to process appeals

## 2023-04-07 ENCOUNTER — Encounter: Payer: Self-pay | Admitting: Internal Medicine

## 2023-04-08 NOTE — Assessment & Plan Note (Signed)
F/u w/Dr Everlena Cooper Refractory migraines (Nurtec is covered).

## 2023-04-08 NOTE — Assessment & Plan Note (Signed)
Seems to be stable.  No new symptoms

## 2023-04-10 ENCOUNTER — Ambulatory Visit: Payer: Medicare Other | Admitting: Neurology

## 2023-04-10 NOTE — Telephone Encounter (Signed)
Spoke with pts pharmacy and was able to give them the providers approval to fill medication early due to pt leaving out of town.

## 2023-04-22 ENCOUNTER — Encounter: Payer: Self-pay | Admitting: Internal Medicine

## 2023-05-05 ENCOUNTER — Ambulatory Visit: Payer: Medicare Other | Admitting: Neurology

## 2023-05-12 ENCOUNTER — Other Ambulatory Visit: Payer: Self-pay | Admitting: Internal Medicine

## 2023-05-12 NOTE — Telephone Encounter (Signed)
Med is not on med list per chart med was d/c 03/09/23 for completed course. Pls advise if you want to renew,,/lmb

## 2023-05-13 MED ORDER — VERAPAMIL HCL ER 120 MG PO TBCR: 120.0000 mg | EXTENDED_RELEASE_TABLET | Freq: Every day | ORAL | 3 refills | Status: DC

## 2023-05-13 NOTE — Addendum Note (Signed)
Addended by: Alois Cliche on: 05/13/2023 03:11 PM   Modules accepted: Orders

## 2023-06-11 ENCOUNTER — Encounter (INDEPENDENT_AMBULATORY_CARE_PROVIDER_SITE_OTHER): Payer: Self-pay

## 2023-07-02 ENCOUNTER — Other Ambulatory Visit: Payer: Self-pay | Admitting: Oncology

## 2023-07-02 DIAGNOSIS — Z006 Encounter for examination for normal comparison and control in clinical research program: Secondary | ICD-10-CM

## 2023-07-06 ENCOUNTER — Encounter: Payer: Self-pay | Admitting: Internal Medicine

## 2023-07-06 ENCOUNTER — Ambulatory Visit (INDEPENDENT_AMBULATORY_CARE_PROVIDER_SITE_OTHER): Payer: Medicare Other | Admitting: Internal Medicine

## 2023-07-06 VITALS — BP 120/78 | HR 76 | Temp 98.3°F | Ht 61.0 in | Wt 154.0 lb

## 2023-07-06 DIAGNOSIS — R635 Abnormal weight gain: Secondary | ICD-10-CM

## 2023-07-06 DIAGNOSIS — G43909 Migraine, unspecified, not intractable, without status migrainosus: Secondary | ICD-10-CM | POA: Diagnosis not present

## 2023-07-06 DIAGNOSIS — Z1211 Encounter for screening for malignant neoplasm of colon: Secondary | ICD-10-CM

## 2023-07-06 DIAGNOSIS — M545 Low back pain, unspecified: Secondary | ICD-10-CM

## 2023-07-06 DIAGNOSIS — E785 Hyperlipidemia, unspecified: Secondary | ICD-10-CM | POA: Diagnosis not present

## 2023-07-06 DIAGNOSIS — G8929 Other chronic pain: Secondary | ICD-10-CM

## 2023-07-06 DIAGNOSIS — G43709 Chronic migraine without aura, not intractable, without status migrainosus: Secondary | ICD-10-CM

## 2023-07-06 LAB — COMPREHENSIVE METABOLIC PANEL
ALT: 16 U/L (ref 0–35)
AST: 23 U/L (ref 0–37)
Albumin: 4.2 g/dL (ref 3.5–5.2)
Alkaline Phosphatase: 69 U/L (ref 39–117)
BUN: 11 mg/dL (ref 6–23)
CO2: 30 meq/L (ref 19–32)
Calcium: 9.8 mg/dL (ref 8.4–10.5)
Chloride: 104 meq/L (ref 96–112)
Creatinine, Ser: 0.94 mg/dL (ref 0.40–1.20)
GFR: 64.65 mL/min (ref >60.00)
Glucose, Bld: 87 mg/dL (ref 70–99)
Potassium: 4.2 meq/L (ref 3.5–5.1)
Sodium: 140 meq/L (ref 135–145)
Total Bilirubin: 0.5 mg/dL (ref 0.2–1.2)
Total Protein: 7 g/dL (ref 6.0–8.3)

## 2023-07-06 LAB — URINALYSIS
Bilirubin Urine: NEGATIVE
Hgb urine dipstick: NEGATIVE
Ketones, ur: NEGATIVE
Leukocytes,Ua: NEGATIVE
Nitrite: NEGATIVE
Specific Gravity, Urine: <=1.005 — AB (ref 1.000–1.030)
Total Protein, Urine: NEGATIVE
Urine Glucose: NEGATIVE
Urobilinogen, UA: 0.2 (ref 0.0–1.0)
pH: 7 (ref 5.0–8.0)

## 2023-07-06 LAB — VITAMIN D 25 HYDROXY (VIT D DEFICIENCY, FRACTURES): VITD: 63.41 ng/mL (ref 30.00–100.00)

## 2023-07-06 LAB — CBC WITH DIFFERENTIAL/PLATELET
Basophils Absolute: 0.1 10*3/uL (ref 0.0–0.1)
Basophils Relative: 2 % (ref 0.0–3.0)
Eosinophils Absolute: 0.1 10*3/uL (ref 0.0–0.7)
Eosinophils Relative: 2.7 % (ref 0.0–5.0)
HCT: 40.5 % (ref 36.0–46.0)
Hemoglobin: 13 g/dL (ref 12.0–15.0)
Lymphocytes Relative: 37.9 % (ref 12.0–46.0)
Lymphs Abs: 1.8 10*3/uL (ref 0.7–4.0)
MCHC: 32.1 g/dL (ref 30.0–36.0)
MCV: 91.4 fl (ref 78.0–100.0)
Monocytes Absolute: 0.2 10*3/uL (ref 0.1–1.0)
Monocytes Relative: 5.3 % (ref 3.0–12.0)
Neutro Abs: 2.4 10*3/uL (ref 1.4–7.7)
Neutrophils Relative %: 52.1 % (ref 43.0–77.0)
Platelets: 223 10*3/uL (ref 150.0–400.0)
RBC: 4.43 Mil/uL (ref 3.87–5.11)
RDW: 13.6 % (ref 11.5–15.5)
WBC: 4.6 10*3/uL (ref 4.0–10.5)

## 2023-07-06 LAB — VITAMIN B12: Vitamin B-12: 410 pg/mL (ref 211–911)

## 2023-07-06 LAB — TSH: TSH: 1.72 u[IU]/mL (ref 0.35–5.50)

## 2023-07-06 LAB — LIPID PANEL
Cholesterol: 203 mg/dL — ABNORMAL HIGH (ref 0–200)
HDL: 63.3 mg/dL (ref >39.00)
LDL Cholesterol: 124 mg/dL — ABNORMAL HIGH (ref 0–99)
NonHDL: 139.58
Total CHOL/HDL Ratio: 3
Triglycerides: 79 mg/dL (ref 0.0–149.0)
VLDL: 15.8 mg/dL (ref 0.0–40.0)

## 2023-07-06 MED ORDER — OXYCODONE HCL 10 MG PO TABS
10.0000 mg | ORAL_TABLET | Freq: Four times a day (QID) | ORAL | 0 refills | Status: DC | PRN
Start: 2023-07-06 — End: 2023-10-01

## 2023-07-06 MED ORDER — OXYCODONE HCL 10 MG PO TABS: 10.0000 mg | ORAL_TABLET | Freq: Four times a day (QID) | ORAL | 0 refills | Status: DC | PRN

## 2023-07-06 NOTE — Progress Notes (Signed)
Subjective:  Patient ID: Beth Huffman, female    DOB: [birth year]  Age: 62 y.o. MRN: [id number]  CC: Follow-up (3 MNTH F/U Pt would like to go back on the 10mg  of her pain meds as [facility] has them back in stock, Pt is also asking for her cologuard to be reordered s the one that was placed has expired.)   HPI Beth Huffman presents for LBP, constipation F/u on chronic pain  Outpatient Medications Prior to Visit  Medication Sig Dispense Refill   albuterol (VENTOLIN HFA) 108 (90 Base) MCG/ACT inhaler Inhale 2 puffs into the lungs every 6 (six) hours as needed for wheezing or shortness of breath. 18 g 2   azelastine (OPTIVAR) 0.05 % ophthalmic solution Apply 1 drop to eye 2 (two) times daily.     cholecalciferol (VITAMIN D) 1000 UNITS tablet Take 1,000 Units by mouth daily.     cyclobenzaprine (FLEXERIL) 5 MG tablet TAKE 1 TABLET THREE TIMES DAILY AS NEEDED FOR MUSCLE SPASMS. 90 tablet 3   estradiol (ESTRACE) 0.5 MG tablet Take 1 tablet (0.5 mg total) by mouth daily. 90 tablet 4   famotidine (PEPCID) 40 MG tablet Take 1 tablet (40 mg total) by mouth daily. 90 tablet 3   Multiple Vitamin (MULTIVITAMIN PO) Take 1 tablet by mouth daily.     naproxen (NAPROSYN) 500 MG tablet Take 1 tablet (500 mg total) by mouth 2 (two) times daily with a meal. 60 tablet 3   oxymetazoline (AFRIN NASAL SPRAY) 0.05 % nasal spray Place 1 spray into both nostrils 2 (two) times daily. 30 mL 0   pantoprazole (PROTONIX) 40 MG tablet Take 1 tablet (40 mg total) by mouth at bedtime. 90 tablet 3   polyethylene glycol (MIRALAX / GLYCOLAX) packet Take 8.5 g by mouth every other day.     rizatriptan (MAXALT) 10 MG tablet TAKE 1 TABLET AT ONSET OF MIGRAINE- MAY REPEAT ONCE IN 2 HOURS. LIMIT 2/24 HOURS. AVOID DAILY USE. 8 tablet 3   senna (SENOKOT) 8.6 MG tablet Take 1 tablet by mouth as needed for constipation.     Turmeric (QC TUMERIC COMPLEX) 500 MG CAPS Take by mouth.     verapamil (CALAN-SR) 120 MG CR tablet Take 1  tablet (120 mg total) by mouth at bedtime. 90 tablet 3   Oxycodone HCl 20 MG TABS Take 0.5 tablets (10 mg total) by mouth 4 (four) times daily as needed. 60 tablet 0   Oxycodone HCl 20 MG TABS Take 0.5 tablets (10 mg total) by mouth 4 (four) times daily as needed. 60 tablet 0   Oxycodone HCl 20 MG TABS Take 0.5 tablets (10 mg total) by mouth 4 (four) times daily as needed. 60 tablet 0   amoxicillin-clavulanate (AUGMENTIN) 875-125 MG tablet Take 1 tablet by mouth 2 (two) times daily. 20 tablet 0   Erenumab-aooe (AIMOVIG) 140 MG/ML SOAJ Inject 140 mg into the skin every 28 (twenty-eight) days. (Patient not taking: Reported on 03/09/2023) 1.12 mL 11   gabapentin (NEURONTIN) 100 MG capsule Take 1 capsule (100 mg total) by mouth 3 (three) times daily. 90 capsule 3   methylPREDNISolone (MEDROL DOSEPAK) 4 MG TBPK tablet As directed 21 tablet 0   MYRBETRIQ 50 MG TB24 tablet Take 50 mg by mouth daily.     phentermine (ADIPEX-P) 37.5 MG tablet Take 1 tablet (37.5 mg total) by mouth daily before breakfast. 30 tablet 2   Rimegepant Sulfate (NURTEC) 75 MG TBDP 1 po qod prn (Patient not  taking: Reported on 03/09/2023) 12 tablet 3   Ubrogepant (UBRELVY) 100 MG TABS Take 0.1 tablets (10 mg total) by mouth as needed (May repeat after 2 hours.  Maximum 2 tablets in 24 hours.). (Patient not taking: Reported on 03/09/2023) 16 tablet 11   No facility-administered medications prior to visit.    ROS: Review of Systems  Constitutional:  Negative for activity change, appetite change, chills, fatigue and unexpected weight change.  HENT:  Negative for congestion, mouth sores and sinus pressure.   Eyes:  Negative for visual disturbance.  Respiratory:  Negative for cough and chest tightness.   Gastrointestinal:  Positive for constipation. Negative for abdominal pain and nausea.  Genitourinary:  Negative for difficulty urinating, frequency and vaginal pain.  Musculoskeletal:  Positive for arthralgias and back pain. Negative  for gait problem.  Skin:  Negative for pallor and rash.  Neurological:  Negative for dizziness, tremors, weakness, numbness and headaches.  Psychiatric/Behavioral:  Positive for dysphoric mood and sleep disturbance. Negative for confusion and suicidal ideas. The patient is nervous/anxious.     Objective:  BP 120/78 (BP Location: Left Arm, Patient Position: Sitting, Cuff Size: Normal)   Pulse 76   Temp 98.3 F (36.8 C) (Oral)   Ht 5\' 1"  (1.549 m)   Wt 154 lb (69.9 kg)   SpO2 94%   BMI 29.10 kg/m   BP Readings from Last 3 Encounters:  07/06/23 120/78  04/02/23 118/82  03/09/23 (!) 142/86    Wt Readings from Last 3 Encounters:  07/06/23 154 lb (69.9 kg)  04/02/23 158 lb (71.7 kg)  03/09/23 168 lb (76.2 kg)    Physical Exam Constitutional:      General: She is not in acute distress.    Appearance: Normal appearance. She is well-developed.  HENT:     Head: Normocephalic.     Right Ear: External ear normal.     Left Ear: External ear normal.     Nose: Nose normal.  Eyes:     General:        Right eye: No discharge.        Left eye: No discharge.     Conjunctiva/sclera: Conjunctivae normal.     Pupils: Pupils are equal, round, and reactive to light.  Neck:     Thyroid: No thyromegaly.     Vascular: No JVD.     Trachea: No tracheal deviation.  Cardiovascular:     Rate and Rhythm: Normal rate and regular rhythm.     Heart sounds: Normal heart sounds.  Pulmonary:     Effort: No respiratory distress.     Breath sounds: No stridor. No wheezing.  Abdominal:     General: Bowel sounds are normal. There is no distension.     Palpations: Abdomen is soft. There is no mass.     Tenderness: There is no abdominal tenderness. There is no guarding or rebound.  Musculoskeletal:        General: Tenderness present.     Cervical back: Normal range of motion and neck supple. No rigidity.  Lymphadenopathy:     Cervical: No cervical adenopathy.  Skin:    Findings: No erythema or  rash.  Neurological:     Mental Status: She is oriented to person, place, and time.     Cranial Nerves: No cranial nerve deficit.     Motor: No abnormal muscle tone.     Coordination: Coordination normal.     Deep Tendon Reflexes: Reflexes normal.  Psychiatric:  Behavior: Behavior normal.        Thought Content: Thought content normal.        Judgment: Judgment normal.   LS w/pain  Lab Results  Component Value Date   WBC 5.2 04/25/2021   HGB 13.3 04/25/2021   HCT 39.6 04/25/2021   PLT 210.0 04/25/2021   GLUCOSE 84 04/04/2022   CHOL 225 (H) 04/25/2021   TRIG 110.0 04/25/2021   HDL 70.00 04/25/2021   LDLDIRECT 130.2 12/21/2013   LDLCALC 133 (H) 04/25/2021   ALT 27 04/25/2021   AST 25 04/25/2021   NA 138 04/04/2022   K 4.6 04/04/2022   CL 102 04/04/2022   CREATININE 0.79 04/04/2022   BUN 12 04/04/2022   CO2 26 04/04/2022   TSH 1.81 04/25/2021   HGBA1C 5.5 03/31/2018    MM 3D DIAGNOSTIC MAMMOGRAM BILATERAL BREAST  Result Date: 03/31/2023 CLINICAL DATA:  62 year old female presenting for evaluation of a new lump in the inferior right breast and for annual exam of the left breast. EXAM: DIGITAL DIAGNOSTIC BILATERAL MAMMOGRAM WITH TOMOSYNTHESIS; ULTRASOUND RIGHT BREAST LIMITED TECHNIQUE: Bilateral digital diagnostic mammography and breast tomosynthesis was performed.; Targeted ultrasound examination of the right breast was performed COMPARISON:  Previous exam(s). ACR Breast Density Category c: The breasts are heterogeneously dense, which may obscure small masses. FINDINGS: Mammogram: Right breast: A skin BB marks the palpable site of concern reported by the patient in the central inferior right breast. Spot tangential view of this area was performed in addition to standard views. There is no new abnormality at the palpable site or elsewhere in the right breast is suggested presence of malignancy. Left breast: No suspicious mass, distortion, or microcalcifications are identified  to suggest presence of malignancy. On physical exam at the site of concern reported by the patient in the central inferior right breast I do not feel a discrete mass or focal area of thickening. Ultrasound: Targeted ultrasound performed at the palpable site of concern in the right breast at 7 o'clock 6 cm from the nipple demonstrating no cystic or solid mass. IMPRESSION: 1. No mammographic or sonographic evidence of malignancy at the palpable site of concern in the right breast. 2. No mammographic evidence of malignancy in the left breast. RECOMMENDATION: 1. Recommend any further workup of the palpable site in the right breast be on a clinical basis given negative imaging findings. 2.  Screening mammogram in one year.(Code:SM-B-01Y) I have discussed the findings and recommendations with the patient. If applicable, a reminder letter will be sent to the patient regarding the next appointment. BI-RADS CATEGORY  1: Negative. Electronically Signed   By: Emmaline Kluver M.D.   On: 03/31/2023 09:26   [place] LIMITED ULTRASOUND INCLUDING AXILLA RIGHT BREAST  Result Date: 03/31/2023 CLINICAL DATA:  62 year old female presenting for evaluation of a new lump in the inferior right breast and for annual exam of the left breast. EXAM: DIGITAL DIAGNOSTIC BILATERAL MAMMOGRAM WITH TOMOSYNTHESIS; ULTRASOUND RIGHT BREAST LIMITED TECHNIQUE: Bilateral digital diagnostic mammography and breast tomosynthesis was performed.; Targeted ultrasound examination of the right breast was performed COMPARISON:  Previous exam(s). ACR Breast Density Category c: The breasts are heterogeneously dense, which may obscure small masses. FINDINGS: Mammogram: Right breast: A skin BB marks the palpable site of concern reported by the patient in the central inferior right breast. Spot tangential view of this area was performed in addition to standard views. There is no new abnormality at the palpable site or elsewhere in the right breast is suggested presence  of malignancy. Left breast: No suspicious mass, distortion, or microcalcifications are identified to suggest presence of malignancy. On physical exam at the site of concern reported by the patient in the central inferior right breast I do not feel a discrete mass or focal area of thickening. Ultrasound: Targeted ultrasound performed at the palpable site of concern in the right breast at 7 o'clock 6 cm from the nipple demonstrating no cystic or solid mass. IMPRESSION: 1. No mammographic or sonographic evidence of malignancy at the palpable site of concern in the right breast. 2. No mammographic evidence of malignancy in the left breast. RECOMMENDATION: 1. Recommend any further workup of the palpable site in the right breast be on a clinical basis given negative imaging findings. 2.  Screening mammogram in one year.(Code:SM-B-01Y) I have discussed the findings and recommendations with the patient. If applicable, a reminder letter will be sent to the patient regarding the next appointment. BI-RADS CATEGORY  1: Negative. Electronically Signed   By: Emmaline Kluver M.D.   On: 03/31/2023 09:26   Assessment & Plan:   Problem List Items Addressed This Visit     Migraine headache   Relevant Medications   Oxycodone HCl 10 MG TABS   Oxycodone HCl 10 MG TABS   Oxycodone HCl 10 MG TABS   Other Relevant Orders   CBC with Differential/Platelet   Comprehensive metabolic panel   Lipid panel   TSH   Urinalysis   Vitamin B12   VITAMIN D 25 Hydroxy (Vit-D Deficiency, Fractures)   LOW BACK PAIN   Relevant Medications   Oxycodone HCl 10 MG TABS   Oxycodone HCl 10 MG TABS   Oxycodone HCl 10 MG TABS   Other Relevant Orders   CBC with Differential/Platelet   Comprehensive metabolic panel   Lipid panel   TSH   Urinalysis   Vitamin B12   VITAMIN D 25 Hydroxy (Vit-D Deficiency, Fractures)   Weight gain    Resolved      Dyslipidemia   Relevant Orders   CBC with Differential/Platelet   Comprehensive  metabolic panel   Lipid panel   TSH   Urinalysis   Vitamin B12   VITAMIN D 25 Hydroxy (Vit-D Deficiency, Fractures)   Other Visit Diagnoses     Screening for colon cancer    -  Primary   Relevant Orders   Cologuard         Meds ordered this encounter  Medications   Oxycodone HCl 10 MG TABS    Sig: Take 1 tablet (10 mg total) by mouth every 6 (six) hours as needed.    Dispense:  120 tablet    Refill:  0    Please fill on or after 09/12/2023   Oxycodone HCl 10 MG TABS    Sig: Take 1 tablet (10 mg total) by mouth every 6 (six) hours as needed.    Dispense:  120 tablet    Refill:  0    Please fill on or after 08/13/2023   Oxycodone HCl 10 MG TABS    Sig: Take 1 tablet (10 mg total) by mouth every 6 (six) hours as needed.    Dispense:  120 tablet    Refill:  0    Please fill on or after 07/14/2023      Follow-up: Return in about 3 months (around 10/05/2023) for a follow-up visit.  Sonda Primes, MD

## 2023-07-06 NOTE — Assessment & Plan Note (Signed)
Resolved

## 2023-07-07 ENCOUNTER — Encounter: Payer: Self-pay | Admitting: Internal Medicine

## 2023-07-20 ENCOUNTER — Encounter: Payer: Self-pay | Admitting: Internal Medicine

## 2023-07-21 NOTE — Addendum Note (Signed)
Addended by: Delsa Grana R on: 07/21/2023 09:08 AM   Modules accepted: Orders

## 2023-08-09 IMAGING — CT CT HEART MORP W/ CTA COR W/ SCORE W/ CA W/CM &/OR W/O CM
4 of 7 series · 8 of 20 positions shown, 9 images · non-contrast
Comparison: None.

Addendum:
CLINICAL DATA: Chest pain

EXAM:
Cardiac/Coronary CTA
TECHNIQUE: A non-contrast, gated CT scan was obtained with axial slices of 3 mm
through the heart for calcium scoring. Calcium scoring was performed
using the Agatston method. A 120 kV prospective, gated, contrast
cardiac scan was obtained. Gantry rotation speed was 250 msecs and
collimation was 0.6 mm. Two sublingual nitroglycerin tablets (0.8
mg) were given. The 3D data set was reconstructed in 5% intervals of
the 35-75% of the R-R cycle. Diastolic phases were analyzed on a
dedicated workstation using MPR, MIP, and VRT modes. The patient
received 95 cc of contrast.

[Series 6: best syst · axial · 0.38mm/px · z∈[-220,-180]mm · 2 of 301 slices shown, 3 images]
[im 101/301  vessel]
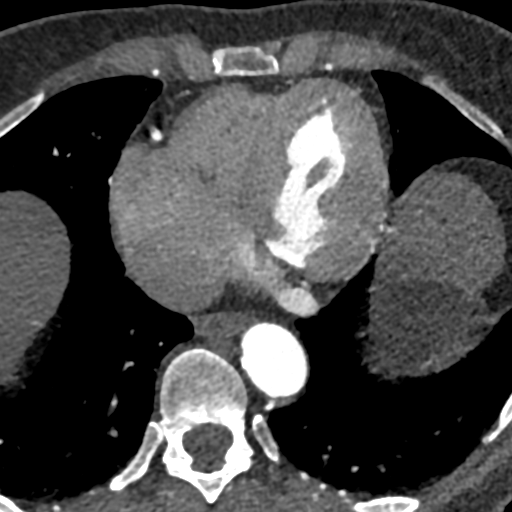
[im 101/301  lung]
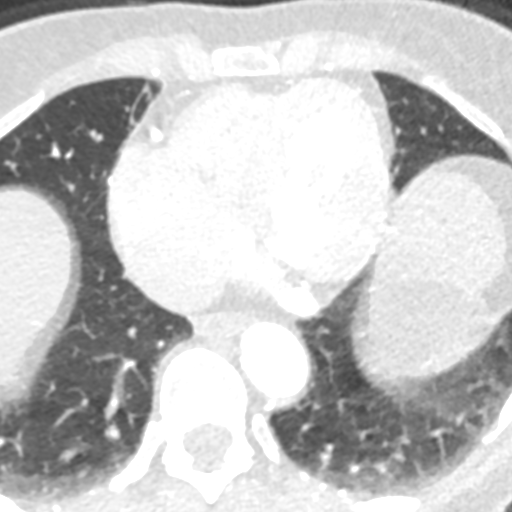
[im 201/301  vessel]
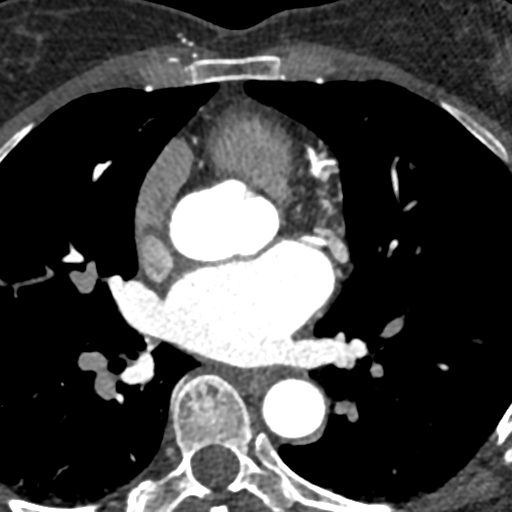

[Series 7: best diast · axial · 0.38mm/px · z∈[-220,-180]mm · 2 of 301 slices shown]
[im 101/301  vessel]
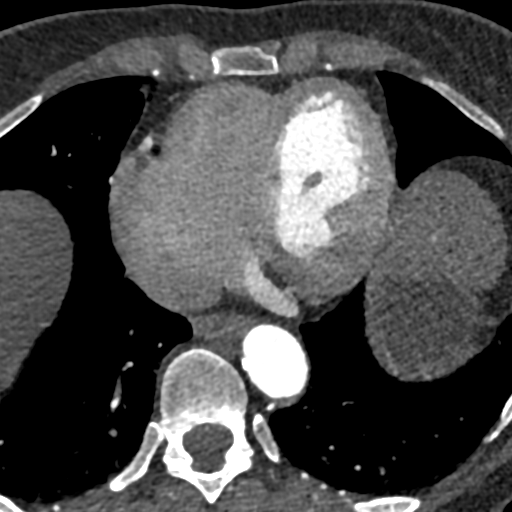
[im 201/301  vessel]
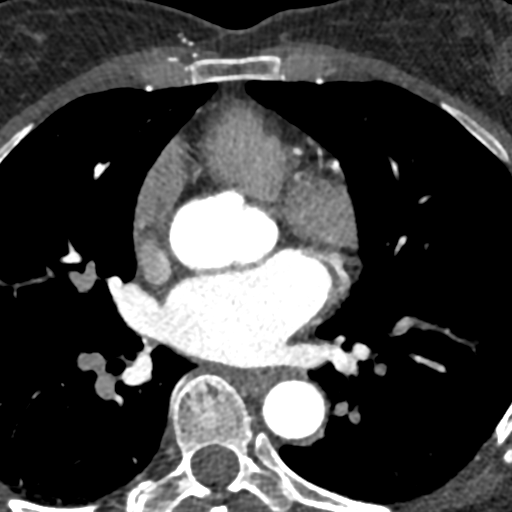

[Series 8: ts diast sharp · axial · 0.38mm/px · z∈[-220,-180]mm · 2 of 301 slices shown]
[im 101/301  lung]
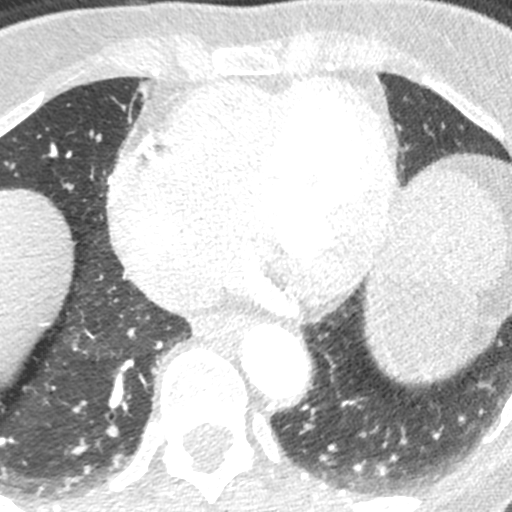
[im 201/301  lung]
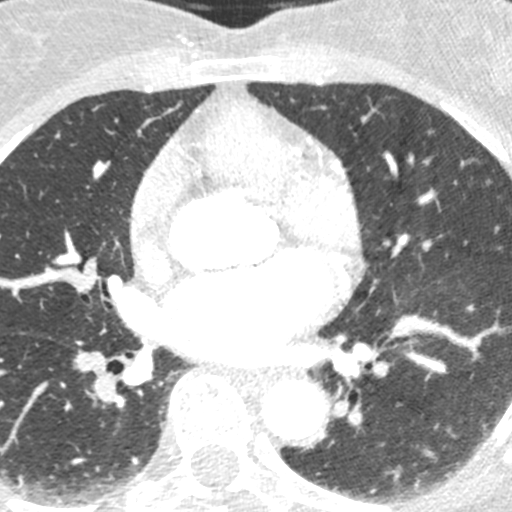

[Series 9: ts syst sharp · axial · 0.38mm/px · z∈[-220,-180]mm · 2 of 301 slices shown]
[im 101/301  lung]
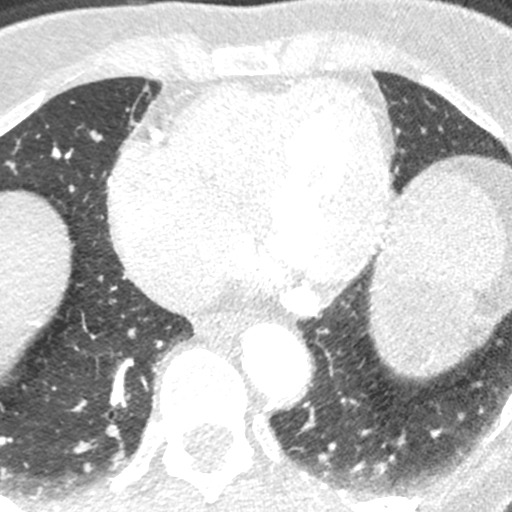
[im 201/301  lung]
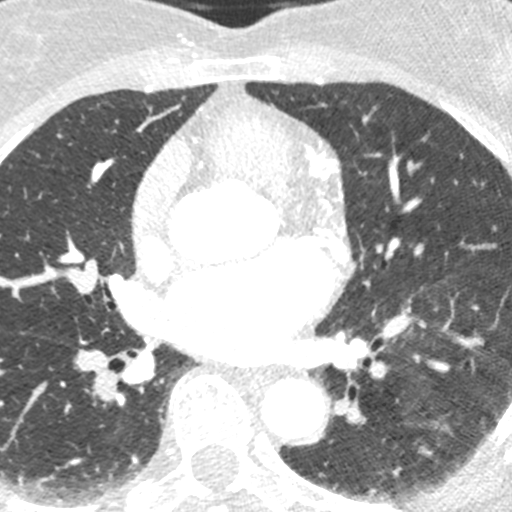

[8 of 20 positions shown; findings below may reference images not displayed]

FINDINGS: Image quality: Excellent.

Noise artifact is: Limited.

Coronary Arteries:  Normal coronary origin.  Right dominance.

Left main: The left main is a large caliber vessel with a normal
take off from the left coronary cusp that bifurcates to form a left
anterior descending artery and a left circumflex artery. There is no
plaque or stenosis.

Left anterior descending artery: The LAD is patent without evidence
of plaque or stenosis. The LAD gives off 1 large bifurcating patent
diagonal branch.

Left circumflex artery: The LCX is non-dominant and patent with no
evidence of plaque or stenosis. The LCX gives off 1 patent obtuse
marginal branch.

Right coronary artery: The RCA is dominant with normal take off from
the right coronary cusp. There is no evidence of plaque or stenosis.
The RCA terminates as a PDA and right posterolateral branch without
evidence of plaque or stenosis.

Right Atrium: Right atrial size is within normal limits.

Right Ventricle: The right ventricular cavity is within normal
limits.

Left Atrium: Left atrial size is normal in size with no left atrial
appendage filling defect.

Left Ventricle: The ventricular cavity size is within normal limits.
There are no stigmata of prior infarction. There is no abnormal
filling defect.

Pulmonary arteries: Normal in size without proximal filling defect.

Pulmonary veins: Normal pulmonary venous drainage.

Pericardium: Normal thickness with no significant effusion or
calcium present.

Cardiac valves: The aortic valve is trileaflet without significant
calcification. The mitral valve is normal structure without
significant calcification.

Aorta: Normal caliber with no significant disease.

Extra-cardiac findings: See attached radiology report for
non-cardiac structures.
IMPRESSION: 1. Coronary calcium score of 0. This was 0 percentile for age-, sex,
and race-matched controls.

2.  Normal coronary origin with right dominance.

3.  Normal coronary arteries.   CAD RADS 0.

4.  Consider non atherosclerotic causes of chest pain.

RECOMMENDATIONS:
1. CAD-RADS 0: No evidence of CAD (0%). Consider non-atherosclerotic
causes of chest pain.

2. CAD-RADS 1: Minimal non-obstructive CAD (0-24%). Consider
non-atherosclerotic causes of chest pain. Consider preventive
therapy and risk factor modification.

3. CAD-RADS 2: Mild non-obstructive CAD (25-49%). Consider
non-atherosclerotic causes of chest pain. Consider preventive
therapy and risk factor modification.

4. CAD-RADS 3: Moderate stenosis. Consider symptom-guided
anti-ischemic pharmacotherapy as well as risk factor modification
per guideline directed care. Additional analysis with CT FFR will be
submitted.

5. CAD-RADS 4: Severe stenosis. (70-99% or > 50% left main). Cardiac
catheterization or CT FFR is recommended. Consider symptom-guided
anti-ischemic pharmacotherapy as well as risk factor modification
per guideline directed care. Invasive coronary angiography
recommended with revascularization per published guideline
statements.

6. CAD-RADS 5: Total coronary occlusion (100%). Consider cardiac
catheterization or viability assessment. Consider symptom-guided
anti-ischemic pharmacotherapy as well as risk factor modification
per guideline directed care.

7. CAD-RADS N: Non-diagnostic study. Obstructive CAD can't be
excluded. Alternative evaluation is recommended.

interpretation by the cardiologist is attached

EXAM:
OVER-READ INTERPRETATION  CT CHEST

The following report is an over-read performed by radiologist Dr.
over-read does not include interpretation of cardiac or coronary
anatomy or pathology. The coronary CTA interpretation by the
cardiologist is attached.
FINDINGS: Abnormal pre-vascular lesion on top most images measuring 2.0 by
cm on image 3 series 10, internal density 63 Hounsfield units,
potentially a pathologically enlarged lymph node or anterior
mediastinal mass.

Arterial phase enhancing lesion in the dome of the right hepatic
lobe 0.8 cm in diameter on image 55 series 10.

Linear subsegmental atelectasis or scarring medially in the right
middle lobe, image 44 series 11.
IMPRESSION: 1. Abnormal soft tissue density prevascular lesion 1.5 cm in short
axis, possibly a pathologically enlarged lymph node mediastinal
mass. Malignancy is not excluded. This is only partially included on
today's examination. Dedicated CT of the chest with contrast is
recommended for complete characterization.
2. Arterial phase enhancing lesion in the dome of the right hepatic
lobe measuring 0.8 cm in diameter. Although most likely a flash
filling hemangioma or similar benign lesion, hypervascular
metastatic lesion or malignancy such as hepatocellular carcinoma
cannot be readily excluded. Consider hepatic protocol MRI with and
without contrast for definitive characterization.

These results will be called to the ordering clinician or
representative by the Radiologist Assistant, and communication
documented in the PACS or [REDACTED].

*** End of Addendum ***
FINDINGS: Image quality: Excellent.

Noise artifact is: Limited.

Coronary Arteries:  Normal coronary origin.  Right dominance.

Left main: The left main is a large caliber vessel with a normal
take off from the left coronary cusp that bifurcates to form a left
anterior descending artery and a left circumflex artery. There is no
plaque or stenosis.

Left anterior descending artery: The LAD is patent without evidence
of plaque or stenosis. The LAD gives off 1 large bifurcating patent
diagonal branch.

Left circumflex artery: The LCX is non-dominant and patent with no
evidence of plaque or stenosis. The LCX gives off 1 patent obtuse
marginal branch.

Right coronary artery: The RCA is dominant with normal take off from
the right coronary cusp. There is no evidence of plaque or stenosis.
The RCA terminates as a PDA and right posterolateral branch without
evidence of plaque or stenosis.

Right Atrium: Right atrial size is within normal limits.

Right Ventricle: The right ventricular cavity is within normal
limits.

Left Atrium: Left atrial size is normal in size with no left atrial
appendage filling defect.

Left Ventricle: The ventricular cavity size is within normal limits.
There are no stigmata of prior infarction. There is no abnormal
filling defect.

Pulmonary arteries: Normal in size without proximal filling defect.

Pulmonary veins: Normal pulmonary venous drainage.

Pericardium: Normal thickness with no significant effusion or
calcium present.

Cardiac valves: The aortic valve is trileaflet without significant
calcification. The mitral valve is normal structure without
significant calcification.

Aorta: Normal caliber with no significant disease.

Extra-cardiac findings: See attached radiology report for
non-cardiac structures.
IMPRESSION: 1. Coronary calcium score of 0. This was 0 percentile for age-, sex,
and race-matched controls.

2.  Normal coronary origin with right dominance.

3.  Normal coronary arteries.   CAD RADS 0.

4.  Consider non atherosclerotic causes of chest pain.

RECOMMENDATIONS:
1. CAD-RADS 0: No evidence of CAD (0%). Consider non-atherosclerotic
causes of chest pain.

2. CAD-RADS 1: Minimal non-obstructive CAD (0-24%). Consider
non-atherosclerotic causes of chest pain. Consider preventive
therapy and risk factor modification.

3. CAD-RADS 2: Mild non-obstructive CAD (25-49%). Consider
non-atherosclerotic causes of chest pain. Consider preventive
therapy and risk factor modification.

4. CAD-RADS 3: Moderate stenosis. Consider symptom-guided
anti-ischemic pharmacotherapy as well as risk factor modification
per guideline directed care. Additional analysis with CT FFR will be
submitted.

5. CAD-RADS 4: Severe stenosis. (70-99% or > 50% left main). Cardiac
catheterization or CT FFR is recommended. Consider symptom-guided
anti-ischemic pharmacotherapy as well as risk factor modification
per guideline directed care. Invasive coronary angiography
recommended with revascularization per published guideline
statements.

6. CAD-RADS 5: Total coronary occlusion (100%). Consider cardiac
catheterization or viability assessment. Consider symptom-guided
anti-ischemic pharmacotherapy as well as risk factor modification
per guideline directed care.

7. CAD-RADS N: Non-diagnostic study. Obstructive CAD can't be
excluded. Alternative evaluation is recommended.

interpretation by the cardiologist is attached

## 2023-08-11 ENCOUNTER — Other Ambulatory Visit: Payer: Self-pay | Admitting: Internal Medicine

## 2023-08-14 NOTE — Telephone Encounter (Signed)
Pt has stated "Good Morning Beth Huffman, Still haven't received the Cologuard. May need to go ahead with a colonoscopy instead. Does your office schedule or somewhere else? Having some issues. Sorry for being so persistent, just need to get some answers. Thank you for your help. Have a great day."

## 2023-08-14 NOTE — Addendum Note (Signed)
Addended by: Delsa Grana R on: 08/14/2023 11:18 AM   Modules accepted: Orders

## 2023-08-19 ENCOUNTER — Other Ambulatory Visit: Payer: Self-pay | Admitting: Internal Medicine

## 2023-08-19 DIAGNOSIS — K5900 Constipation, unspecified: Secondary | ICD-10-CM

## 2023-08-21 DIAGNOSIS — Z1211 Encounter for screening for malignant neoplasm of colon: Secondary | ICD-10-CM | POA: Diagnosis not present

## 2023-08-27 IMAGING — CT CT CHEST W/ CM
2 of 4 series · 15 of 36 positions shown, 18 images · IV contrast (OMNIPAQUE 300)
Comparison: Partial chest CT for cardiac workup on 12/26/2021 is
the only prior chest imaging.

CLINICAL DATA: Prevascular mass on cardiac CT.

EXAM:
CT CHEST WITH CONTRAST
TECHNIQUE: Multidetector CT imaging of the chest was performed during
intravenous contrast administration.

[Series 2: thorax · axial · 0.65mm/px · z∈[-300,-44]mm · 12 of 152 slices shown, 15 images]
[im 12/152  mediastinal]
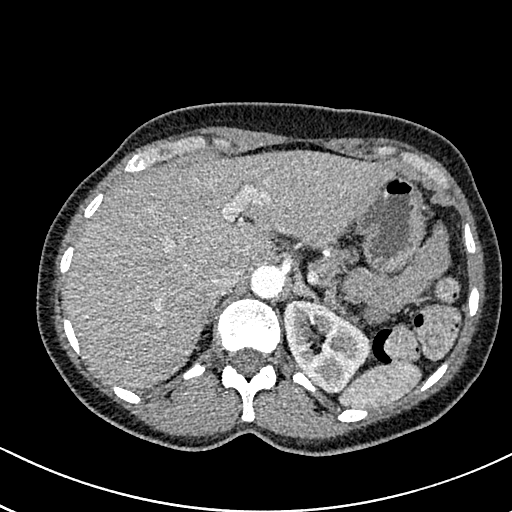
[im 12/152  lung]
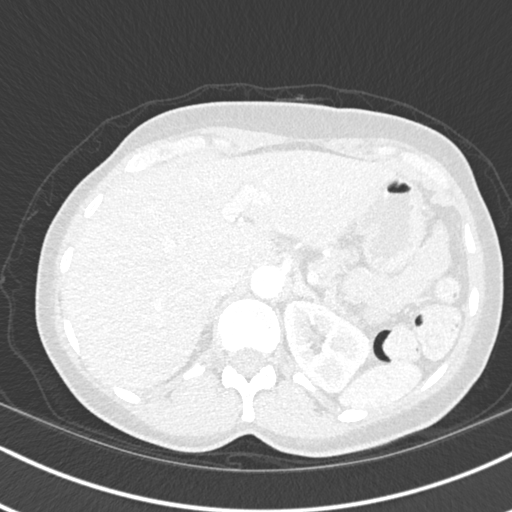
[im 24/152  lung]
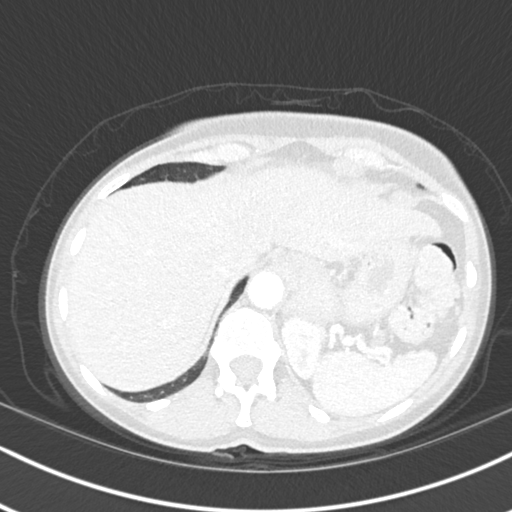
[im 35/152  lung]
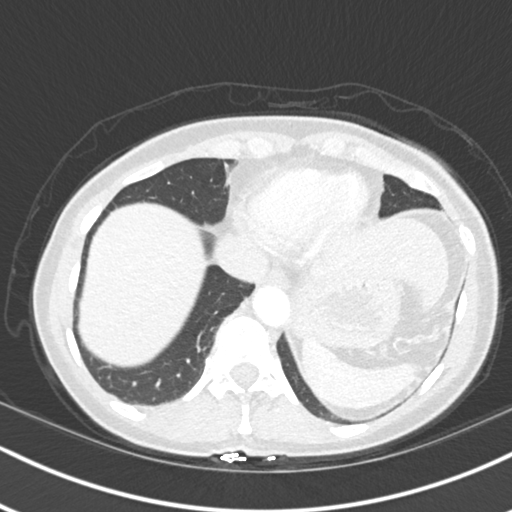
[im 47/152  lung]
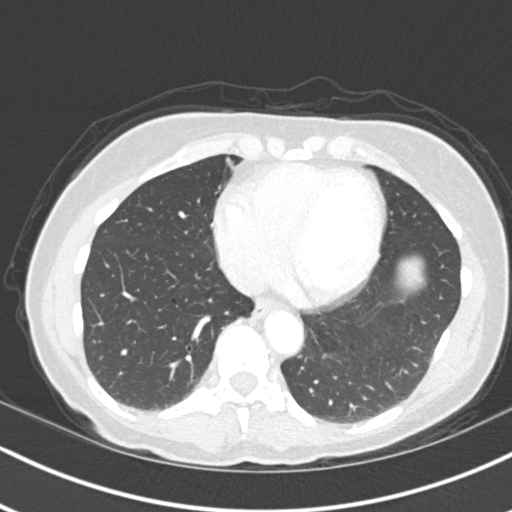
[im 59/152  mediastinal]
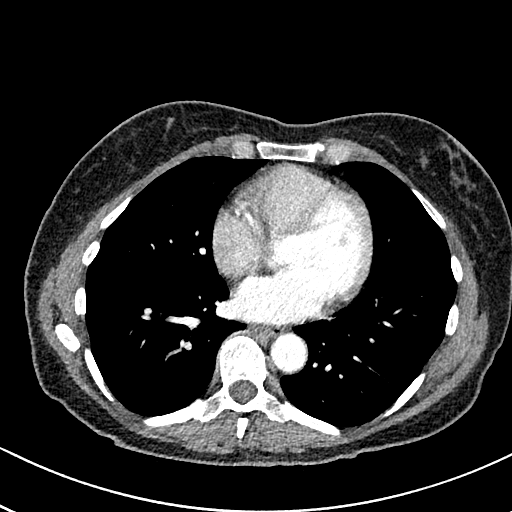
[im 59/152  lung]
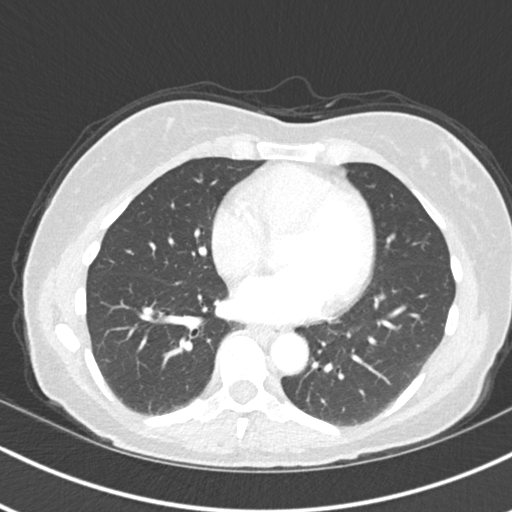
[im 70/152  lung]
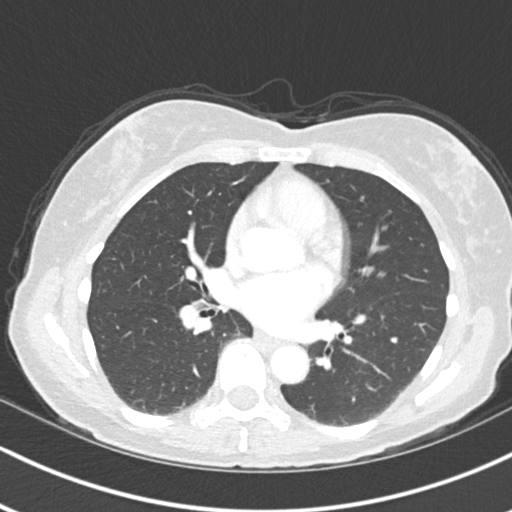
[im 82/152  lung]
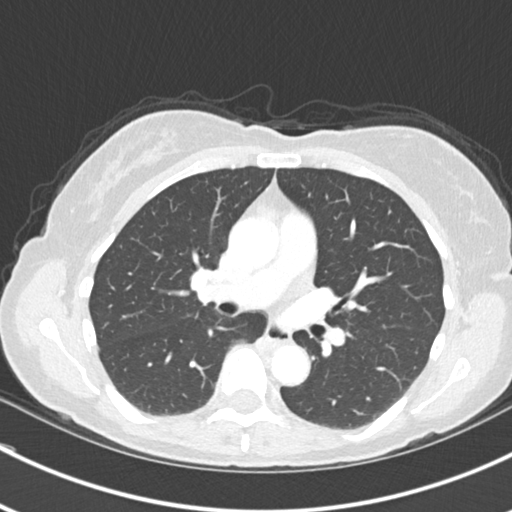
[im 93/152  lung]
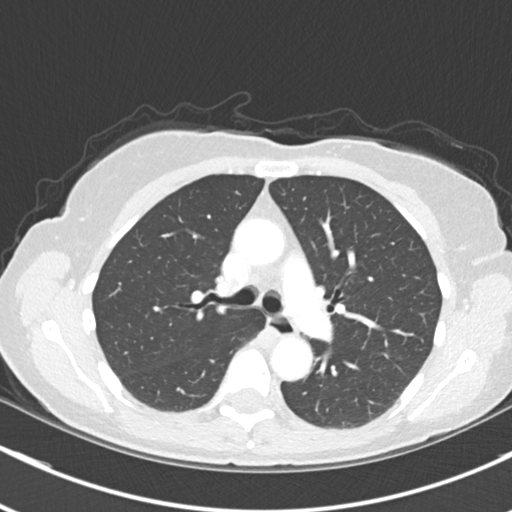
[im 105/152  mediastinal]
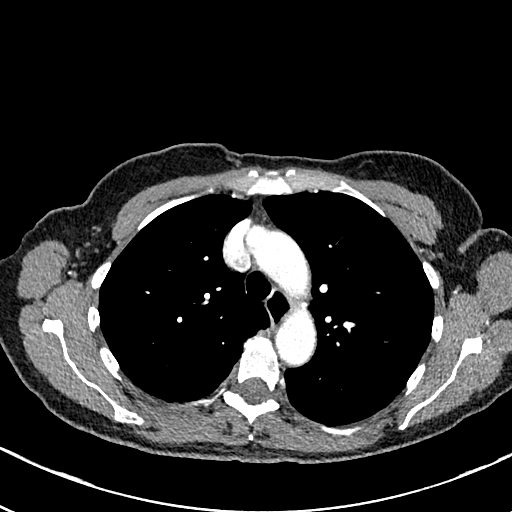
[im 105/152  lung]
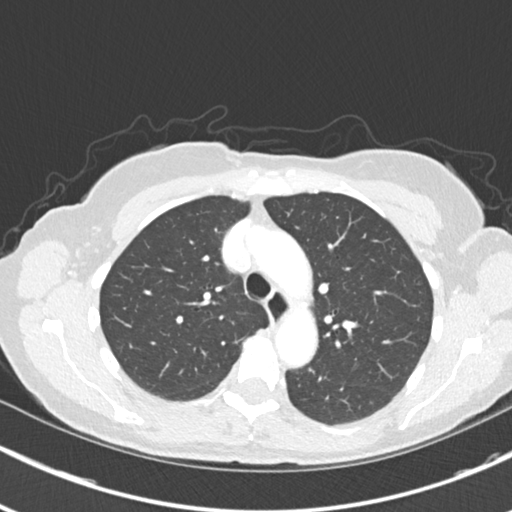
[im 117/152  lung]
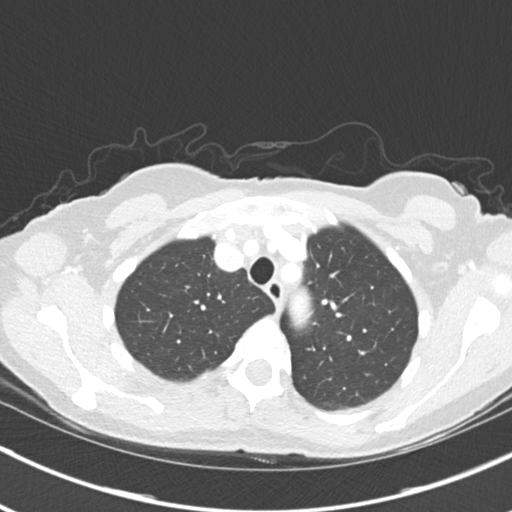
[im 128/152  lung]
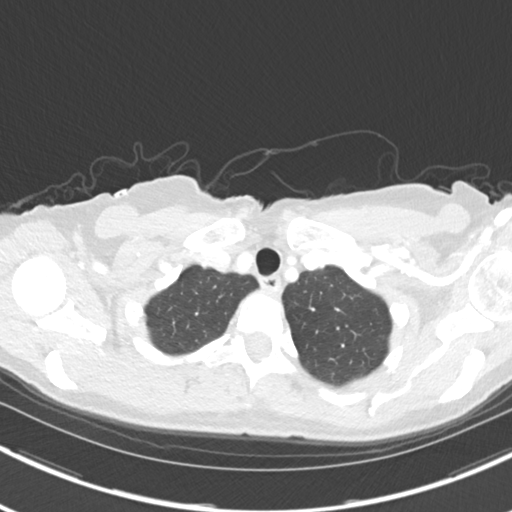
[im 140/152  lung]
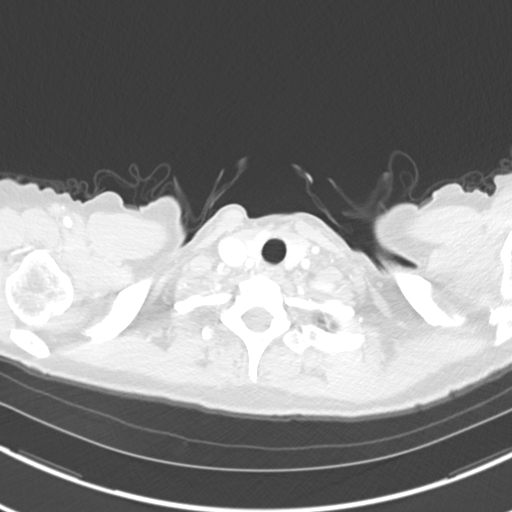

[Series 5: coronal · coronal · 0.59mm/px · 3 of 109 slices shown]
[im 22/109  lung]
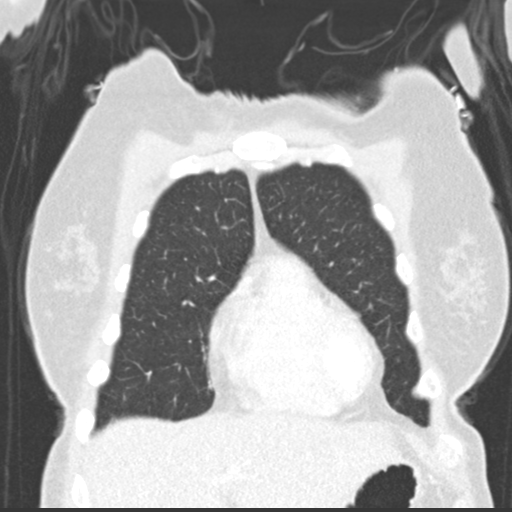
[im 44/109  lung]
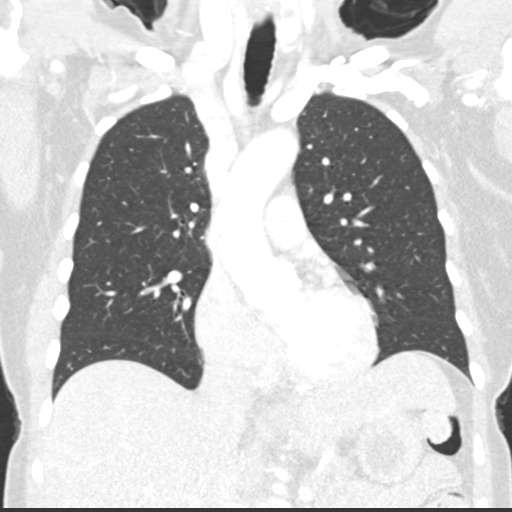
[im 65/109  lung]
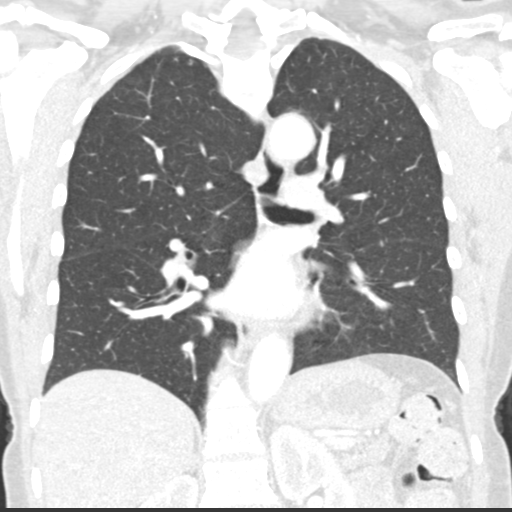

[15 of 36 positions shown; findings below may reference images not displayed]

RADIATION DOSE REDUCTION: This exam was performed according to the
departmental dose-optimization program which includes automated
exposure control, adjustment of the mA and/or kV according to
patient size and/or use of iterative reconstruction technique.

CONTRAST:  75mL OMNIPAQUE IOHEXOL 300 MG/ML  SOLN
FINDINGS: Cardiovascular: The cardiac size is normal. There is no pericardial
effusion. There is no visible coronary artery calcification.

Small amount of calcific atherosclerosis seen in the arch and
descending aorta.

No dissection, aneurysm or penetrating ulcer is seen. The great
vessels are within normal limits.

Mediastinum/Nodes: 8.2 mm heterogeneous nodule left lobe of the
thyroid gland. Thyroid is otherwise unremarkable. No follow-up
imaging recommended. The trachea, thoracic esophagus and axillary
spaces are unremarkable.

There is a prevascular space ovoid slightly lobular mass measuring
2.0 x 1.3 cm, post-contrast enhancement density of 60 Hounsfield
units. There is no further evidence of intrathoracic adenopathy.

Lungs/Pleura: In the left mid lung, there is a 5 mm fissural
noncalcified nodule on [DATE] and a 3.0 mm fissural nodule on [DATE].

There are no further nodules.  No infiltrates or fibrotic changes.

The central airways and lungs are otherwise clear. There is no
pleural effusion, thickening or pneumothorax.

Upper Abdomen: No acute abnormality.

Musculoskeletal: No fracture or suspicious bone lesion. There is
mild thoracic kyphodextroscoliosis, thoracic spondylosis.
IMPRESSION: 1. 2.0 x 1.0 cm ovoid slightly lobular prevascular space mass. No
other mediastinal abnormality or hilar adenopathy. This could be a
pathologic lymph node whether due to primary or metastatic disease
versus infection, or could be a small thymoma or thymic neoplasm.
PET-CT recommended.
2. Left-sided fissural nodules measuring 5 mm and 3 mm. No routine
imaging follow-up recommended for low risk patients. These
guidelines do not apply to immunocompromised patients and patients
with cancer. F/U in patients with significant comorbidities as
clinically warranted. For lung cancer screening, adhere to Lung-RADS
guidelines.
3. Aortic atherosclerosis.
4. 8.2 mm heterogeneous left thyroid nodule. No imaging follow-up
recommended.

## 2023-08-29 LAB — COLOGUARD: COLOGUARD: NEGATIVE

## 2023-09-02 DIAGNOSIS — K080 Exfoliation of teeth due to systemic causes: Secondary | ICD-10-CM | POA: Diagnosis not present

## 2023-09-03 DIAGNOSIS — K080 Exfoliation of teeth due to systemic causes: Secondary | ICD-10-CM | POA: Diagnosis not present

## 2023-09-08 DIAGNOSIS — K080 Exfoliation of teeth due to systemic causes: Secondary | ICD-10-CM | POA: Diagnosis not present

## 2023-09-09 ENCOUNTER — Other Ambulatory Visit: Payer: Self-pay | Admitting: Internal Medicine

## 2023-10-01 ENCOUNTER — Encounter: Payer: Self-pay | Admitting: Internal Medicine

## 2023-10-01 ENCOUNTER — Ambulatory Visit: Payer: Medicare Other | Admitting: Internal Medicine

## 2023-10-01 VITALS — BP 110/70 | HR 68 | Temp 98.3°F | Ht 61.0 in | Wt 156.0 lb

## 2023-10-01 DIAGNOSIS — H538 Other visual disturbances: Secondary | ICD-10-CM

## 2023-10-01 DIAGNOSIS — G950 Syringomyelia and syringobulbia: Secondary | ICD-10-CM | POA: Diagnosis not present

## 2023-10-01 DIAGNOSIS — M5441 Lumbago with sciatica, right side: Secondary | ICD-10-CM | POA: Diagnosis not present

## 2023-10-01 DIAGNOSIS — M5442 Lumbago with sciatica, left side: Secondary | ICD-10-CM

## 2023-10-01 DIAGNOSIS — R635 Abnormal weight gain: Secondary | ICD-10-CM

## 2023-10-01 DIAGNOSIS — G8929 Other chronic pain: Secondary | ICD-10-CM

## 2023-10-01 DIAGNOSIS — G43709 Chronic migraine without aura, not intractable, without status migrainosus: Secondary | ICD-10-CM

## 2023-10-01 DIAGNOSIS — G441 Vascular headache, not elsewhere classified: Secondary | ICD-10-CM | POA: Diagnosis not present

## 2023-10-01 MED ORDER — OXYCODONE HCL 10 MG PO TABS: 10.0000 mg | ORAL_TABLET | Freq: Four times a day (QID) | ORAL | 0 refills | Status: DC | PRN

## 2023-10-01 NOTE — Assessment & Plan Note (Addendum)
Stopped phentermine

## 2023-10-01 NOTE — Assessment & Plan Note (Signed)
Ordered brain MRI 

## 2023-10-01 NOTE — Progress Notes (Signed)
Subjective:  Patient ID: Beth Huffman, female    DOB: 14-Sep-1961  Age: 62 y.o. MRN: [id number]  CC: Medical Management of Chronic Issues (3 mnth f/u)   HPI Beth Huffman presents for LBP, anxiety, insomnia  Outpatient Medications Prior to Visit  Medication Sig Dispense Refill   albuterol (VENTOLIN HFA) 108 (90 Base) MCG/ACT inhaler Inhale 2 puffs into the lungs every 6 (six) hours as needed for wheezing or shortness of breath. 18 g 2   azelastine (OPTIVAR) 0.05 % ophthalmic solution Apply 1 drop to eye 2 (two) times daily.     cholecalciferol (VITAMIN D) 1000 UNITS tablet Take 1,000 Units by mouth daily.     cyclobenzaprine (FLEXERIL) 5 MG tablet TAKE 1 TABLET THREE TIMES DAILY AS NEEDED FOR MUSCLE SPASMS. 90 tablet 3   estradiol (ESTRACE) 0.5 MG tablet Take 1 tablet (0.5 mg total) by mouth daily. 90 tablet 4   famotidine (PEPCID) 40 MG tablet Take 1 tablet (40 mg total) by mouth daily. 90 tablet 3   Multiple Vitamin (MULTIVITAMIN PO) Take 1 tablet by mouth daily.     naproxen (NAPROSYN) 500 MG tablet Take 1 tablet (500 mg total) by mouth 2 (two) times daily with a meal. 60 tablet 3   oxymetazoline (AFRIN NASAL SPRAY) 0.05 % nasal spray Place 1 spray into both nostrils 2 (two) times daily. 30 mL 0   pantoprazole (PROTONIX) 40 MG tablet Take 1 tablet (40 mg total) by mouth at bedtime. 90 tablet 3   polyethylene glycol (MIRALAX / GLYCOLAX) packet Take 8.5 g by mouth every other day.     rizatriptan (MAXALT) 10 MG tablet TAKE 1 TABLET AT ONSET OF MIGRAINE- MAY REPEAT ONCE IN 2 HOURS. LIMIT 2/24 HOURS. AVOID DAILY USE. 8 tablet 3   senna (SENOKOT) 8.6 MG tablet Take 1 tablet by mouth as needed for constipation.     Turmeric (QC TUMERIC COMPLEX) 500 MG CAPS Take by mouth.     verapamil (CALAN-SR) 120 MG CR tablet Take 1 tablet (120 mg total) by mouth at bedtime. 90 tablet 3   Oxycodone HCl 10 MG TABS Take 1 tablet (10 mg total) by mouth every 6 (six) hours as needed. 120 tablet 0    Oxycodone HCl 10 MG TABS Take 1 tablet (10 mg total) by mouth every 6 (six) hours as needed. 120 tablet 0   Oxycodone HCl 10 MG TABS Take 1 tablet (10 mg total) by mouth every 6 (six) hours as needed. 120 tablet 0   No facility-administered medications prior to visit.    ROS: Review of Systems  Constitutional:  Negative for activity change, appetite change, chills, fatigue and unexpected weight change.  HENT:  Negative for congestion, mouth sores and sinus pressure.   Eyes:  Negative for visual disturbance.  Respiratory:  Negative for cough and chest tightness.   Gastrointestinal:  Negative for abdominal pain and nausea.  Genitourinary:  Negative for difficulty urinating, frequency and vaginal pain.  Musculoskeletal:  Negative for back pain and gait problem.  Skin:  Negative for pallor and rash.  Neurological:  Negative for dizziness, tremors, weakness, numbness and headaches.  Psychiatric/Behavioral:  Negative for confusion and sleep disturbance.     Objective:  BP 110/70 (BP Location: Right Arm, Patient Position: Sitting, Cuff Size: Normal)   Pulse 68   Temp 98.3 F (36.8 C) (Oral)   Ht 5\' 1"  (1.549 m)   Wt 156 lb (70.8 kg)   SpO2 97%   BMI  29.48 kg/m   BP Readings from Last 3 Encounters:  10/01/23 110/70  07/06/23 120/78  04/02/23 118/82    Wt Readings from Last 3 Encounters:  10/01/23 156 lb (70.8 kg)  07/06/23 154 lb (69.9 kg)  04/02/23 158 lb (71.7 kg)    Physical Exam Constitutional:      General: She is not in acute distress.    Appearance: She is well-developed. She is obese.  HENT:     Head: Normocephalic.     Right Ear: External ear normal.     Left Ear: External ear normal.     Nose: Nose normal.  Eyes:     General:        Right eye: No discharge.        Left eye: No discharge.     Conjunctiva/sclera: Conjunctivae normal.     Pupils: Pupils are equal, round, and reactive to light.  Neck:     Thyroid: No thyromegaly.     Vascular: No JVD.      Trachea: No tracheal deviation.  Cardiovascular:     Rate and Rhythm: Normal rate and regular rhythm.     Heart sounds: Normal heart sounds.  Pulmonary:     Effort: No respiratory distress.     Breath sounds: No stridor. No wheezing.  Abdominal:     General: Bowel sounds are normal. There is no distension.     Palpations: Abdomen is soft. There is no mass.     Tenderness: There is no abdominal tenderness. There is no guarding or rebound.  Musculoskeletal:        General: Tenderness present.     Cervical back: Normal range of motion and neck supple. No rigidity.     Right lower leg: No edema.     Left lower leg: No edema.  Lymphadenopathy:     Cervical: No cervical adenopathy.  Skin:    Findings: No erythema or rash.  Neurological:     Mental Status: She is oriented to person, place, and time.     Cranial Nerves: No cranial nerve deficit.     Motor: No abnormal muscle tone.     Coordination: Coordination normal.     Deep Tendon Reflexes: Reflexes normal.  Psychiatric:        Behavior: Behavior normal.        Thought Content: Thought content normal.        Judgment: Judgment normal.     Lab Results  Component Value Date   WBC 4.6 07/06/2023   HGB 13.0 07/06/2023   HCT 40.5 07/06/2023   PLT 223.0 07/06/2023   GLUCOSE 87 07/06/2023   CHOL 203 (H) 07/06/2023   TRIG 79.0 07/06/2023   HDL 63.30 07/06/2023   LDLDIRECT 130.2 12/21/2013   LDLCALC 124 (H) 07/06/2023   ALT 16 07/06/2023   AST 23 07/06/2023   NA 140 07/06/2023   K 4.2 07/06/2023   CL 104 07/06/2023   CREATININE 0.94 07/06/2023   BUN 11 07/06/2023   CO2 30 07/06/2023   TSH 1.72 07/06/2023   HGBA1C 5.5 03/31/2018    MM 3D DIAGNOSTIC MAMMOGRAM BILATERAL BREAST  Result Date: 03/31/2023 CLINICAL DATA:  62 year old female presenting for evaluation of a new lump in the inferior right breast and for annual exam of the left breast. EXAM: DIGITAL DIAGNOSTIC BILATERAL MAMMOGRAM WITH TOMOSYNTHESIS; ULTRASOUND RIGHT  BREAST LIMITED TECHNIQUE: Bilateral digital diagnostic mammography and breast tomosynthesis was performed.; Targeted ultrasound examination of the right breast was performed COMPARISON:  Previous exam(s). ACR Breast Density Category c: The breasts are heterogeneously dense, which may obscure small masses. FINDINGS: Mammogram: Right breast: A skin BB marks the palpable site of concern reported by the patient in the central inferior right breast. Spot tangential view of this area was performed in addition to standard views. There is no new abnormality at the palpable site or elsewhere in the right breast is suggested presence of malignancy. Left breast: No suspicious mass, distortion, or microcalcifications are identified to suggest presence of malignancy. On physical exam at the site of concern reported by the patient in the central inferior right breast I do not feel a discrete mass or focal area of thickening. Ultrasound: Targeted ultrasound performed at the palpable site of concern in the right breast at 7 o'clock 6 cm from the nipple demonstrating no cystic or solid mass. IMPRESSION: 1. No mammographic or sonographic evidence of malignancy at the palpable site of concern in the right breast. 2. No mammographic evidence of malignancy in the left breast. RECOMMENDATION: 1. Recommend any further workup of the palpable site in the right breast be on a clinical basis given negative imaging findings. 2.  Screening mammogram in one year.(Code:SM-B-01Y) I have discussed the findings and recommendations with the patient. If applicable, a reminder letter will be sent to the patient regarding the next appointment. BI-RADS CATEGORY  1: Negative. Electronically Signed   By: Emmaline Kluver M.D.   On: 03/31/2023 09:26   [place] LIMITED ULTRASOUND INCLUDING AXILLA RIGHT BREAST  Result Date: 03/31/2023 CLINICAL DATA:  62 year old female presenting for evaluation of a new lump in the inferior right breast and for annual exam of  the left breast. EXAM: DIGITAL DIAGNOSTIC BILATERAL MAMMOGRAM WITH TOMOSYNTHESIS; ULTRASOUND RIGHT BREAST LIMITED TECHNIQUE: Bilateral digital diagnostic mammography and breast tomosynthesis was performed.; Targeted ultrasound examination of the right breast was performed COMPARISON:  Previous exam(s). ACR Breast Density Category c: The breasts are heterogeneously dense, which may obscure small masses. FINDINGS: Mammogram: Right breast: A skin BB marks the palpable site of concern reported by the patient in the central inferior right breast. Spot tangential view of this area was performed in addition to standard views. There is no new abnormality at the palpable site or elsewhere in the right breast is suggested presence of malignancy. Left breast: No suspicious mass, distortion, or microcalcifications are identified to suggest presence of malignancy. On physical exam at the site of concern reported by the patient in the central inferior right breast I do not feel a discrete mass or focal area of thickening. Ultrasound: Targeted ultrasound performed at the palpable site of concern in the right breast at 7 o'clock 6 cm from the nipple demonstrating no cystic or solid mass. IMPRESSION: 1. No mammographic or sonographic evidence of malignancy at the palpable site of concern in the right breast. 2. No mammographic evidence of malignancy in the left breast. RECOMMENDATION: 1. Recommend any further workup of the palpable site in the right breast be on a clinical basis given negative imaging findings. 2.  Screening mammogram in one year.(Code:SM-B-01Y) I have discussed the findings and recommendations with the patient. If applicable, a reminder letter will be sent to the patient regarding the next appointment. BI-RADS CATEGORY  1: Negative. Electronically Signed   By: Emmaline Kluver M.D.   On: 03/31/2023 09:26   Assessment & Plan:   Problem List Items Addressed This Visit     SYRINGOMYELIA - Primary    Seems to  be stable.  No new  symptoms      Migraine headache    F/u w/Dr Everlena Cooper Refractory migraines (Nurtec is covered). On Verapamil        Relevant Medications   Oxycodone HCl 10 MG TABS   Oxycodone HCl 10 MG TABS   Oxycodone HCl 10 MG TABS   LOW BACK PAIN     Due to syringomyelia On Oxycodone  Potential benefits of a long term opioids use as well as potential risks (i.e. addiction risk, apnea etc) and complications (i.e. Somnolence, constipation and others) were explained to the patient and were aknowledged.      Relevant Medications   Oxycodone HCl 10 MG TABS   Oxycodone HCl 10 MG TABS   Oxycodone HCl 10 MG TABS   Weight gain    Stopped phentermine      Headache    Ordered brain MRI      Relevant Medications   Oxycodone HCl 10 MG TABS   Oxycodone HCl 10 MG TABS   Oxycodone HCl 10 MG TABS   Blurry vision    Limit lifting, straining         Meds ordered this encounter  Medications   Oxycodone HCl 10 MG TABS    Sig: Take 1 tablet (10 mg total) by mouth every 6 (six) hours as needed.    Dispense:  120 tablet    Refill:  0    Please fill on or after 10/12/2023   Oxycodone HCl 10 MG TABS    Sig: Take 1 tablet (10 mg total) by mouth every 6 (six) hours as needed.    Dispense:  120 tablet    Refill:  0    Please fill on or after 11/11/2023   Oxycodone HCl 10 MG TABS    Sig: Take 1 tablet (10 mg total) by mouth every 6 (six) hours as needed.    Dispense:  120 tablet    Refill:  0    Please fill on or after 12/11/2023      Follow-up: Return in about 3 months (around 12/30/2023) for a follow-up visit.  Sonda Primes, MD

## 2023-10-01 NOTE — Assessment & Plan Note (Signed)
Seems to be stable.  No new symptoms

## 2023-10-01 NOTE — Assessment & Plan Note (Signed)
F/u w/Dr Everlena Cooper Refractory migraines (Nurtec is covered). On Verapamil

## 2023-10-01 NOTE — Assessment & Plan Note (Signed)
Limit lifting, straining

## 2023-10-01 NOTE — Assessment & Plan Note (Signed)
Due to syringomyelia On Oxycodone  Potential benefits of a long term opioids use as well as potential risks (i.e. addiction risk, apnea etc) and complications (i.e. Somnolence, constipation and others) were explained to the patient and were aknowledged. 

## 2023-10-04 ENCOUNTER — Other Ambulatory Visit: Payer: Self-pay | Admitting: Internal Medicine

## 2023-10-05 ENCOUNTER — Ambulatory Visit: Payer: Medicare Other | Admitting: Internal Medicine

## 2023-11-04 ENCOUNTER — Telehealth: Payer: Self-pay | Admitting: Internal Medicine

## 2023-11-04 NOTE — Telephone Encounter (Signed)
 Pt sent this via Mychart to the scheduling pool:    1. 6 months 2. Yes to SOB, not sure what CP is. 3. Yes, see records with with Dr. Fernande 4. Yes, 8 weeks at appointments with Dr. Garald 5. Chest Tightness occasionally.     Can you tell me a little more about your Flutters so I can get this over to our triage nurse to advise     How long have you had palpitations/irregular HR/ Afib? Are you having the symptoms now?   Are you currently experiencing lightheadedness, SOB or CP?   Do you have a history of afib (atrial fibrillation) or irregular heart rhythm?   Have you checked your BP or HR? (document readings if available):   Are you experiencing any other symptoms?       I am flexible with my schedule. I do have some questions regarding additional flutters with my heart. Now is fine.

## 2023-11-04 NOTE — Telephone Encounter (Signed)
 Spoke with patient and she states she has been having epiodes of tachycardia and she feels like she will pass out. It  only last 30 secs. Last episode was 3 weeks ago. Her chest also gets tight when this is happening. Currently asymptomatic. She can not give any vitals.   Sh will go to the pharmacy to get a BP machine to monitor her  BP and HR. ED precautions discussed.   Scheduling please call to get her scheduled with EP APP

## 2023-11-06 ENCOUNTER — Other Ambulatory Visit (HOSPITAL_COMMUNITY): Payer: Self-pay | Attending: Oncology

## 2023-11-11 ENCOUNTER — Other Ambulatory Visit: Payer: Self-pay | Admitting: Internal Medicine

## 2023-11-11 NOTE — Progress Notes (Signed)
  Electrophysiology Office Note:   Date:  11/12/2023  ID:  Beth Huffman, DOB [birth year], MRN [id number]  Primary Cardiologist: None Primary Heart Failure: None Electrophysiologist: Sherryl Manges, MD      History of Present Illness:   Beth Huffman is a 63 y.o. female with h/o atrial tachycardia, HTN seen today for routine electrophysiology followup.   Called EP Clinic 11/04/23 with reports of tachycardia episodes where she feels as though she may pass out.  Episodes last approximately 30 seconds and resolve spontaneously.  Last episodes was approx 3 weeks prior to the call.  She was instructed to get a BP machine to monitor her HR and blood pressure.   Since last being seen in our clinic the patient reports she has been taking her verapamil as prescribed. She previously was on cardizem and "did not do well with it". She has noted in the past couple of months that she has had at least 4-5 episodes of heart racing that lasts 30 seconds at at time with chest tightness.  She feels as though she may pass out and has fatigue after. She denies chest pain, palpitations, dyspnea, PND, orthopnea, nausea, vomiting, dizziness, syncope, edema, weight gain, or early satiety.   Review of systems complete and found to be negative unless listed in HPI.   EP Information / Studies Reviewed:    EKG is ordered today. Personal review as below.  EKG Interpretation Date/Time:  Thursday November 12 2023 08:55:09 EST Ventricular Rate:  83 PR Interval:  132 QRS Duration:  102 QT Interval:  388 QTC Calculation: 455 R Axis:   76  Text Interpretation: Normal sinus rhythm Incomplete right bundle branch block Confirmed by Canary Brim ([id number]) on 11/12/2023 8:58:37 AM   Studies:  ECHO 04/2018 > LVEF 60-65% LHC 04/2018 > normal coronary arteries  CTA 12/2021 > CAC of 0   Arrhythmia / AAD Atrial Tachycardia        Physical Exam:   VS:  BP 130/82   Pulse 83   Ht 5\' 4"  (1.626 m)   Wt 158 lb (71.7 kg)   SpO2  98%   BMI 27.12 kg/m    Wt Readings from Last 3 Encounters:  11/12/23 158 lb (71.7 kg)  10/01/23 156 lb (70.8 kg)  07/06/23 154 lb (69.9 kg)     GEN: Well nourished, well developed in no acute distress NECK: No JVD; No carotid bruits CARDIAC: Regular rate and rhythm, no murmurs, rubs, gallops RESPIRATORY:  Clear to auscultation without rales, wheezing or rhonchi  ABDOMEN: Soft, non-tender, non-distended EXTREMITIES:  No edema; No deformity   ASSESSMENT AND PLAN:    Atrial Tachycardia -EKG NSR  -continue verapamil 120mg  for now, elected not to push dose given she has swelling at times already on current dose   -add Toprol 25mg  at bedtime  -ZIO monitor for 14 days to assess arrhythmia  -consider EPS in future to map and address AT or other atrial arrhythmia  Hypertension  -well controlled on current regimen  -cautioned patient on position changes with addition of new beta blocker   Follow up with Dr. Graciela Husbands  as scheduled in February   Signed, Canary Brim, NP-C, AGACNP-BC [facility] - Electrophysiology  11/12/2023, 9:22 AM

## 2023-11-12 ENCOUNTER — Ambulatory Visit: Payer: HMO | Attending: Pulmonary Disease | Admitting: Pulmonary Disease

## 2023-11-12 ENCOUNTER — Ambulatory Visit (INDEPENDENT_AMBULATORY_CARE_PROVIDER_SITE_OTHER): Payer: HMO

## 2023-11-12 ENCOUNTER — Other Ambulatory Visit: Payer: Self-pay | Admitting: Internal Medicine

## 2023-11-12 ENCOUNTER — Encounter: Payer: Self-pay | Admitting: Pulmonary Disease

## 2023-11-12 VITALS — BP 130/82 | HR 83 | Ht 64.0 in | Wt 158.0 lb

## 2023-11-12 DIAGNOSIS — I493 Ventricular premature depolarization: Secondary | ICD-10-CM

## 2023-11-12 DIAGNOSIS — I4719 Other supraventricular tachycardia: Secondary | ICD-10-CM | POA: Diagnosis not present

## 2023-11-12 MED ORDER — OXYCODONE HCL 10 MG PO TABS: 10.0000 mg | ORAL_TABLET | Freq: Four times a day (QID) | ORAL | 0 refills | Status: DC | PRN

## 2023-11-12 MED ORDER — METOPROLOL SUCCINATE ER 25 MG PO TB24: 25.0000 mg | ORAL_TABLET | Freq: Every day | ORAL | 3 refills | Status: DC

## 2023-11-12 NOTE — Progress Notes (Unsigned)
Enrolled for [employer] to mail a ZIO XT long term holter monitor to the patients address on file.   Dr. Klein to read. 

## 2023-11-12 NOTE — Patient Instructions (Signed)
Medication Instructions:  Start metoprolol succinate (Toprol XL) 25 mg at bedtime. *If you need a refill on your cardiac medications before your next appointment, please call your pharmacy*  Lab Work: None ordered If you have labs (blood work) drawn today and your tests are completely normal, you will receive your results only by: MyChart Message (if you have MyChart) OR A paper copy in the mail If you have any lab test that is abnormal or we need to change your treatment, we will call you to review the results.  Testing/Procedures: Beth Huffman- Long Term Monitor Instructions  Your physician has requested you wear a ZIO patch monitor for 14 days.  This is a single patch monitor. [employer] supplies one patch monitor per enrollment. Additional stickers are not available. Please do not apply patch if you will be having a Nuclear Stress Test,  Echocardiogram, Cardiac CT, MRI, or Chest Xray during the period you would be wearing the  monitor. The patch cannot be worn during these tests. You cannot remove and re-apply the  ZIO XT patch monitor.  Your ZIO patch monitor will be mailed 3 day USPS to your address on file. It may take 3-5 days  to receive your monitor after you have been enrolled.  Once you have received your monitor, please review the enclosed instructions. Your monitor  has already been registered assigning a specific monitor serial # to you.  Billing and Patient Assistance Program Information  We have supplied [employer] with any of your insurance information on file for billing purposes. [employer] offers a sliding scale Patient Assistance Program for patients that do not have  insurance, or whose insurance does not completely cover the cost of the ZIO monitor.  You must apply for the Patient Assistance Program to qualify for this discounted rate.  To apply, please call [employer] at [phone], select option 4, select option 2, ask to apply for  Patient Assistance Program. Beth Huffman will  ask your household income, and how many people  are in your household. They will quote your out-of-pocket cost based on that information.  [employer] will also be able to set up a 78-month, interest-free payment plan if needed.  Applying the monitor   Shave hair from upper left chest.  Hold abrader disc by orange tab. Rub abrader in 40 strokes over the upper left chest as  indicated in your monitor instructions.  Clean area with 4 enclosed alcohol pads. Let dry.  Apply patch as indicated in monitor instructions. Patch will be placed under collarbone on left  side of chest with arrow pointing upward.  Rub patch adhesive wings for 2 minutes. Remove white label marked "1". Remove the white  label marked "2". Rub patch adhesive wings for 2 additional minutes.  While looking in a mirror, press and release button in center of patch. A small green light will  flash 3-4 times. This will be your only indicator that the monitor has been turned on.  Do not shower for the first 24 hours. You may shower after the first 24 hours.  Press the button if you feel a symptom. You will hear a small click. Record Date, Time and  Symptom in the Patient Logbook.  When you are ready to remove the patch, follow instructions on the last 2 pages of Patient  Logbook. Stick patch monitor onto the last page of Patient Logbook.  Place Patient Logbook in the blue and white box. Use locking tab on box and tape box closed  securely.  The blue and white box has prepaid postage on it. Please place it in the mailbox as  soon as possible. Your physician should have your test results approximately 7 days after the  monitor has been mailed back to [facility].  Call [facility] Customer Care at [phone] if you have questions regarding  your ZIO XT patch monitor. Call them immediately if you see an orange light blinking on your  monitor.  If your monitor falls off in less than 4 days, contact our Monitor department at  [phone].  If your monitor becomes loose or falls off after 4 days call [employer] at [phone] for  suggestions on securing your monitor    Follow-Up: At [facility], you and your health needs are our priority.  As part of our continuing mission to provide you with exceptional heart care, we have created designated Provider Care Teams.  These Care Teams include your primary Cardiologist (physician) and Advanced Practice Providers (APPs -  Physician Assistants and Nurse Practitioners) who all work together to provide you with the care you need, when you need it.  Your next appointment:   As scheduled  Provider:   Sherryl Manges, MD

## 2023-11-13 ENCOUNTER — Other Ambulatory Visit: Payer: Self-pay | Admitting: Internal Medicine

## 2023-11-13 ENCOUNTER — Encounter: Payer: Self-pay | Admitting: Internal Medicine

## 2023-11-14 ENCOUNTER — Other Ambulatory Visit: Payer: Self-pay | Admitting: Internal Medicine

## 2023-11-14 MED ORDER — OXYCODONE HCL 10 MG PO TABS: 10.0000 mg | ORAL_TABLET | Freq: Four times a day (QID) | ORAL | 0 refills | Status: DC | PRN

## 2023-11-16 ENCOUNTER — Encounter: Payer: Self-pay | Admitting: Internal Medicine

## 2023-11-16 ENCOUNTER — Other Ambulatory Visit: Payer: Self-pay | Admitting: Internal Medicine

## 2023-11-17 ENCOUNTER — Telehealth: Payer: Self-pay | Admitting: Neurology

## 2023-11-17 NOTE — Telephone Encounter (Signed)
PA team please start a PA for Botox.  Advised patient we may need a new visit for the PA but we will go ahead and try.

## 2023-11-17 NOTE — Telephone Encounter (Signed)
Pt called in stating she now has [employer] and wants to see if that insurance might cover botox? She is hoping they will and she could start getting botox again.

## 2023-11-19 ENCOUNTER — Other Ambulatory Visit: Payer: Self-pay | Admitting: Internal Medicine

## 2023-11-19 MED ORDER — RIZATRIPTAN BENZOATE 10 MG PO TABS: ORAL_TABLET | ORAL | 5 refills | Status: DC

## 2023-11-19 NOTE — Progress Notes (Signed)
Virtual Visit via Video Note  Consent was obtained for video visit:  Yes.   Answered questions that patient had about telehealth interaction:  Yes.   I discussed the limitations, risks, security and privacy concerns of performing an evaluation and management service by telemedicine. I also discussed with the patient that there may be a patient responsible charge related to this service. The patient expressed understanding and agreed to proceed.  Pt location: Home Physician Location: office Name of referring provider:  Plotnikov, Georgina Quint, MD I connected with Beth Huffman at patients initiation/request on 11/20/2023 at  1:50 PM EST by video enabled telemedicine application and verified that I am speaking with the correct person using two identifiers. Pt MRN:  [id number] Pt DOB:  [birth year] Video Participants:  Beth Huffman   Assessment/Plan:   Chronic migraine without aura, without status migrainosus, not intractable. - she did better on Botox. Visual disturbance, positional when sitting and pressing against her back - likely related to the syringomyelia     1.  Migraine prevention:  Plan to restart Botox 2.  Migraine rescue:  She has rizatriptan but Bernita Raisin more effective.  She is going to look into change in insurance for better coverage. 3.  Limit use of pain relievers to no more than 2 days out of week to prevent risk of rebound or medication-overuse headache. 4.  Keep headache diary 5.  Follow up for Botox   Subjective:  Beth Huffman is a 63 year old right-handed Caucasian woman who follows up for migraines.   UPDATE: Last visit, she requested changing back from Aimovig to Botox, as Botox was helpful with her neck pain.  She had one round of Botox but insurance would not continue to cover it.   Intensity:  severe Duration:  1 hour with rizatriptan but migraine returns next day (occurs 3 consecutive days) Frequency: 15-20 days a month.  When she sits with any  pressure on her spine, she endorses seeing flashes of white light or floaters.    Current NSAIDS:  naproxen (take with triptan) Current analgesics:  oxycodone Current triptans:  Maxalt 10mg   Current ergotamine:  none Current anti-emetic:  Zofran 8mg  Current muscle relaxants:  Flexeril 5mg  Current anti-anxiolytic:  none Current sleep aide:  none Current Antihypertensive medications:  metoprolol succinate XL 25mg  daily, Verapamil CR 128mg  Current Antidepressant medications:  none Current Anticonvulsant medications:  none Current anti-CGRP:  none Current Vitamins/Herbal/Supplements:  none Current Antihistamines/Decongestants:  Flonase Other therapy:  none Hormone/birth control:  estradiol   Caffeine:  1 1/2 cups coffee daily Diet:  Keeps hydrated.  Does not skip meals.  On Optivia diet.  Lost 30 lbs since January Exercise:  Walks dogs Depression:  no; Anxiety:  no Other pain:  Spinal pain related to perineural cysts.  Recent knee replacement, still with mild soreness Sleep hygiene:  good   HISTORY:  Onset:  Since the early 1990s.  She was evaluated at [facility]  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    MRI of spine from 2005 showed a syrinx from T3 to T12 levels.  She subsequently underwent occipital posterior fossa decompression surgery that year.  A CT cystogram in 2007 showed residual cyst in the posterior fossa with postoperative changes but no hydrocephalus or other intracranial mass.  Headaches subsided following decompression.  They started to return in  May 2016.  She was previously followed by Dr. Terrace Arabia at [facility]. Location:  Bifrontal/posterior neck pain Quality:  Pressure, throbbing Initial intensity:  10/10.  she denies new headache, thunderclap headache or severe headache that wakes her from  sleep. Aura:  Beginning in 2023, she has been experiencing new symptoms.  When she sits up in a chair she has monocular visual disturbance in either eye, described as squiggly lines, sometimes a flash or white patch.  It lasts as long as she is sitting up in that position.  Right sided neck pain is aggravated in that position.  Eye exams have been unrevealing.  MRI of brain, cervical and thoracic spine on 05/07/2022 revealed no changes compared to prior imaging.  Premonitory Phase:  no Postdrome:  no Associated symptoms:  With or without nausea, photophobia, phonophobia.  She denies associated unilateral numbness or weakness. Initial Duration:  1-2 days.  Often in the morning when she wakes up Initial Frequency:  15 days a month Initial Frequency of abortive medication: sumatriptan or rizatriptan 12-15 days a month Triggers:  Prolonged standing or activity/lifting Relieving factors:  Just medications. Activity:  Can't function   Past NSAIDS:  Cambia, ibuprofen Past analgesics:  Excedrin, Tylenol Past abortive triptans:  Relpax 40mg , Zomig 5mg  NS, sumatriptan [place]/tab Past abortive ergotamine:  none Past muscle relaxants:  none Past anti-emetic:  Zofran Past antihypertensive medications:  atenolol Past antidepressant medications:  Nortriptyline (did not feel well on it) Past anticonvulsant medications:  Trokendi XR,topiramate 200mg  twice daily;  gabapentin, Lyrica Past anti-CGRP: Emgality (palpitations), Aimovig, Ubrelvy 100mg  Past vitamins/Herbal/Supplements:  none Past antihistamines/decongestants:  none Other past therapies:  Botox (effective)   Family history of headache:  No   05/07/2022 MRI BRAIN WO:  Distant suboccipital craniotomy. Mega cisterna magna, unchanged in appearance compared to the examination of 05/20/2007.  Otherwise normal appearance of the brain with exception of a punctate focus of T2 and FLAIR signal in the right temporal lobe, unchanged since 2008, not  significant. 05/07/2022 MRI C-SPINE WO:  Prominence of the central canal from upper C6 to the level of the C7-T1 disc, maximal transverse diameter 2 mm, the same as was described in 2009.  No compressive pathology of the canal or foramina. Mild facet osteoarthritis on the right at C3-4 and C4-5. 05/07/2022 MRI T-SPINE WO:  No progressive or worsening finding compared to the study of 2009. Thoracic syrinx extending from the level of the T3-4 disc to the T11-12 disc level. The syrinx is actually slightly smaller when compared to the prior study, maximal transverse diameter at T4 measuring 3 mm today compared with 3.2 mm previously and with slight reduction in transverse diameter in the lower thoracic region, though the maximal measurement at the T10 level remains 3.6 mm.  Past Medical History: Past Medical History:  Diagnosis Date   Abnormal weight gain    Acute sinusitis 12/05/2008   1/18 8/18   Arachnoid cyst 08/19/2011   Chest tightness or pressure 05/07/2018   Constipation 09/08/2012   Chronic - opioid related 11/17 Amitiza   Contact dermatitis 03/16/2013   5/14 relapsing - poison ivy    Dyspnea 05/07/2018   Elevated liver enzymes 03/04/2011   Chronic off and on    Female stress incontinence 02/06/2009   Qualifier: Diagnosis of  By: Posey Rea MD, Georgina Quint    FEVER UNSPECIFIED 09/29/2007   Qualifier: Diagnosis of  By: Posey Rea MD, Georgina Quint    GERD 09/29/2007   Chronic  Protonix prn  Potential benefits of a long term PPI use as well as potential risks  and complications were explained to the patient and were aknowledged.     GERD (gastroesophageal reflux disease)    Headache 12/20/2007   Dr Daphine Deutscher Migraines (1 per 2 wks) Topamax 400 mg/d; Imitrex prn; Ibuprofen prn, Zofran prn    Hyperglycemia 08/19/2011   Mild     Hypertension    Increased blood pressure (not hypertension) 08/23/2014   Intractable migraine 06/14/2009   Chronic Ibuprofen, Imitrex, Topamax,  Nortriptyline - intolerant    Knee pain 03/04/2011   R knee 2018 L knee pain - L knee b anserina and L knee is tender w/ROM: L knee pain is worse - patellofemoral syndrome vs other   LBP (low back pain)    Local anesthetic drug adverse reaction 01/21/2022   LOW BACK PAIN 09/29/2007   Chronic, lately (2016) disabling Due to syringomyelia On Oxycodone  Potential benefits of a long term opioids use as well as potential risks (i.e. addiction risk, apnea etc) and complications (i.e. Somnolence, constipation and others) were explained to the patient and were aknowledged.     LUQ PAIN 05/23/2010   Qualifier: Diagnosis of  By: Plotnikov MD, Georgina Quint    Migraine, chronic, without aura, intractable 10/28/2017   Botox approved diagnosis. 7/19 Worse. Start Emagility inj. Relpax   Migraines    Nausea & vomiting 08/19/2011   Nausea without vomiting 06/14/2009   Due to migraines     NECK PAIN 06/14/2009    Chronic, lately (2016) disabling Due to syringomyelia On Oxycodone  Potential benefits of a long term opioids use as well as potential risks (i.e. addiction risk, apnea etc) and complications (i.e. Somnolence, constipation and others) were explained to the patient and were aknowledged.     NONSPECIFIC ABNORMAL RESULTS LIVR FUNCTION STUDY 09/28/2009   Qualifier: Diagnosis of  By: Posey Rea MD, Georgina Quint    OBSESSIVE-COMPULSIVE DISORDER 05/23/2010   Chronic - compulsive shopping    Onychomycosis 01/30/2016   4/17    Orthostatic hypotension 06/30/2013   9/14 likely Rapaflo induced    OTITIS EXTERNA 05/10/2008   Qualifier: Diagnosis of  By: Posey Rea MD, Georgina Quint    Palpitations 06/21/2014   PARESTHESIA 07/13/2008   Qualifier: Diagnosis of  By: Posey Rea MD, Georgina Quint    Partial thickness burn of left wrist 09/11/2015   Postoperative state 07/24/2016   Pyelonephritis 2008   Rash 07/27/2017   Stress 2009   Syncope, near 06/30/2013   9/14 likely Rapaflo induced 12/14 relapsing off Rapaflo x 7 2/15 no relapsing since on 1/2 dose of Topamax     Syringobulbia (HCC)    SYRINGOMYELIA 08/20/2007   Chronic Dr Terrace Arabia Dr Haskell Riling at [facility] Chronic pain    Syringomyelia (HCC)    Tachycardia    Tonsillitis, chronic 02/26/2017   2018 worse   URINARY TRACT INFECTION (UTI) 03/23/2008   Qualifier: Diagnosis of  By: Posey Rea MD, Georgina Quint    UTI (lower urinary tract infection)    Well adult exam 12/21/2013   We discussed age appropriate health related issues, including available/recomended screening tests and vaccinations. We discussed a need for adhering to healthy diet and exercise. Labs/EKG were reviewed/ordered. All questions were answered.       Medications: Outpatient Encounter Medications as of 11/20/2023  Medication Sig   albuterol (VENTOLIN HFA) 108 (90 Base) MCG/ACT inhaler Inhale 2 puffs into the lungs every 6 (six) hours as needed for wheezing or shortness of breath.  azelastine (OPTIVAR) 0.05 % ophthalmic solution Apply 1 drop to eye 2 (two) times daily.   cholecalciferol (VITAMIN D) 1000 UNITS tablet Take 1,000 Units by mouth daily.   cyclobenzaprine (FLEXERIL) 5 MG tablet TAKE 1 TABLET THREE TIMES DAILY AS NEEDED FOR MUSCLE SPASMS.   estradiol (ESTRACE) 0.5 MG tablet Take 1 tablet (0.5 mg total) by mouth daily.   famotidine (PEPCID) 40 MG tablet Take 1 tablet (40 mg total) by mouth daily.   metoprolol succinate (TOPROL XL) 25 MG 24 hr tablet Take 1 tablet (25 mg total) by mouth at bedtime.   Multiple Vitamin (MULTIVITAMIN PO) Take 1 tablet by mouth daily.   naproxen (NAPROSYN) 500 MG tablet Take 1 tablet (500 mg total) by mouth 2 (two) times daily with a meal.   ondansetron (ZOFRAN) 8 MG tablet TAKE ONE TABLET BY MOUTH EVERY 8 HOURS AS NEEDED FOR NAUSEA OR FOR VOMITING   Oxycodone HCl 10 MG TABS Take 1 tablet (10 mg total) by mouth every 6 (six) hours as needed.   oxymetazoline (AFRIN NASAL SPRAY) 0.05 % nasal spray Place 1 spray into both nostrils 2 (two) times daily.   pantoprazole (PROTONIX) 40 MG tablet Take 1 tablet (40 mg total)  by mouth at bedtime.   polyethylene glycol (MIRALAX / GLYCOLAX) packet Take 8.5 g by mouth every other day.   rizatriptan (MAXALT) 10 MG tablet TAKE 1 TABLET AT ONSET OF MIGRAINE- MAY REPEAT ONCE IN 2 HOURS. LIMIT 2/24 HOURS. AVOID DAILY USE.   senna (SENOKOT) 8.6 MG tablet Take 1 tablet by mouth as needed for constipation.   verapamil (CALAN-SR) 120 MG CR tablet Take 1 tablet (120 mg total) by mouth at bedtime.   [DISCONTINUED] Turmeric (QC TUMERIC COMPLEX) 500 MG CAPS Take by mouth.   [DISCONTINUED] Oxycodone HCl 10 MG TABS Take 1 tablet (10 mg total) by mouth every 6 (six) hours as needed.   [DISCONTINUED] Oxycodone HCl 10 MG TABS Take 1 tablet (10 mg total) by mouth every 6 (six) hours as needed.   [DISCONTINUED] rizatriptan (MAXALT) 10 MG tablet TAKE 1 TABLET AT ONSET OF MIGRAINE- MAY REPEAT ONCE IN 2 HOURS. LIMIT 2/24 HOURS. AVOID DAILY USE.   No facility-administered encounter medications on file as of 11/20/2023.    Allergies: Allergies  Allergen Reactions   Atenolol Other (See Comments)    Wt gain   Cefuroxime Axetil Nausea Only and Other (See Comments)    REACTION: nausea - but tolerates PCN   Codeine Sulfate Nausea And Vomiting and Nausea Only   Fentanyl Nausea And Vomiting and Other (See Comments)    REACTION: bad reaction   Imdur [Isosorbide Nitrate] Other (See Comments)    Headaches   Nitrofurantoin Other (See Comments)    Unknown   Pregabalin Other (See Comments)    REACTION: cp   Rapaflo [Silodosin] Other (See Comments)    Orthostatic BP drop, near syncope    Topamax [Topiramate]     Tight chest    Tramadol Other (See Comments)   Tramadol Hcl Nausea And Vomiting    REACTION: sick   Iodinated Contrast Media Rash    Family History: Family History  Problem Relation Age of Onset   COPD Mother    Peripheral vascular disease Mother    Hypertension Mother    Heart disease Mother    Hypertension Father    Heart attack Maternal Grandmother    Stroke Maternal  Grandfather    Stroke Paternal Grandfather    Breast cancer Maternal  Aunt    Colon cancer Maternal Uncle 60   Anxiety disorder Other    Asthma Granddaughter     Observations/Objective:   No acute distress.  Alert and oriented.  Speech fluent and not dysarthric.  Language intact.     Follow Up Instructions:    -I discussed the assessment and treatment plan with the patient. The patient was provided an opportunity to ask questions and all were answered. The patient agreed with the plan and demonstrated an understanding of the instructions.   The patient was advised to call back or seek an in-person evaluation if the symptoms worsen or if the condition fails to improve as anticipated.   Cira Servant, DO  CC: Jacinta Shoe, MD

## 2023-11-20 ENCOUNTER — Telehealth: Payer: HMO | Admitting: Neurology

## 2023-11-20 ENCOUNTER — Encounter: Payer: Self-pay | Admitting: Neurology

## 2023-11-20 DIAGNOSIS — G43709 Chronic migraine without aura, not intractable, without status migrainosus: Secondary | ICD-10-CM | POA: Diagnosis not present

## 2023-11-20 DIAGNOSIS — G950 Syringomyelia and syringobulbia: Secondary | ICD-10-CM

## 2023-12-04 DIAGNOSIS — I4719 Other supraventricular tachycardia: Secondary | ICD-10-CM | POA: Diagnosis not present

## 2023-12-04 DIAGNOSIS — I493 Ventricular premature depolarization: Secondary | ICD-10-CM | POA: Diagnosis not present

## 2023-12-07 NOTE — Progress Notes (Signed)
HPI :  63 year old female with a history of chronic constipation, GERD, dysphagia, here to reestablish care.  I last saw her for colonoscopy in December 2017, we have not seen her since that time.  Her primary care is Dr. Posey Rea.  She is accompanied by her husband today.  She states she has had constipation ongoing for many years, roughly 20 years.  She states a 90% of the time she feels constipated.  She has a bowel movement every other day to once every 2 days.  She strains and passes hard stools.  For the past year she has been having intermittent rectal bleeding in the setting of her constipation.  She sees bright red blood on the toilet paper in the toilet at times.  She has a hard time clarifying how frequently this occurs.  She has painless rectal bleeding.  She does take oxycodone roughly 4 times daily for the past 20 years for treatment of chronic pain associated with syringomyelia.  She follows with chronic pain management.  She is also on verapamil for SVT.  She reports trying numerous over-the-counter regimens for her constipation in the past none of which have helped much.  She has used MiraLAX before, over-the-counter stool softeners, senna, mineral oil, Metamucil.  She was tried on Linzess roughly 5 or 6 years ago but states it caused abdominal cramping and could have been too strong for her.  Her last colonoscopy with me was in December 2017, no obvious concerning pathology noted however her prep was inadequate.  I recommended a repeat exam in 1 year or so.  She was not able to follow-up for that.  She did have a negative Cologuard 4 years ago as well as in October 2024.  Of note she has been having palpitations lately.  She had a Holter monitor with cardiology which showed some SVT.  She is due to see Dr. Graciela Husbands of electrophysiology in 2 weeks.  In addition she has been having some dysphagia ongoing for "a while".  She states if she eats dried foods or peanut butter she can feel it  get hung up in her chest with swallows.  She also has a history of GERD that she has been dealing with more recently.  Been taking Protonix 40 mg once in the morning and Pepcid 20 mg at night.  She still has symptoms that can bother her despite doing this regimen.  She has never had a prior upper endoscopy.    Prior workup: Colonoscopy 10/22/16: - The perianal and digital rectal examinations were normal. - Stool was found in the ascending colon and in the cecum, making visualization difficult. Dependant portions of the colon had some residual stool which clogged the endoscope. Lavage of the area was performed. No polyps were noted however in some dependant portions, small or flat polyps may not have been appreciated. - A few medium-mouthed diverticula were found in the sigmoid colon. - The exam was otherwise without abnormality on direct and retroflexion views.  Recommended a repeat exam in 1-2 years   Echo 05/03/18: EF 60-65%  Cardiac cath 05/07/2018: Normal coronary arteries Normal left ventricular size and function.  EF 65%  Nuclear stress test 03/31/22: normal., EF 57%   Past Medical History:  Diagnosis Date   Abnormal weight gain    Acute sinusitis 12/05/2008   1/18 8/18   Arachnoid cyst 08/19/2011   Chest tightness or pressure 05/07/2018   Constipation 09/08/2012   Chronic - opioid related 11/17 Amitiza  Contact dermatitis 03/16/2013   5/14 relapsing - poison ivy    Dyspnea 05/07/2018   Elevated liver enzymes 03/04/2011   Chronic off and on    Female stress incontinence 02/06/2009   Qualifier: Diagnosis of  By: Plotnikov MD, Georgina Quint    FEVER UNSPECIFIED 09/29/2007   Qualifier: Diagnosis of  By: Posey Rea MD, Georgina Quint    GERD 09/29/2007   Chronic  Protonix prn  Potential benefits of a long term PPI use as well as potential risks  and complications were explained to the patient and were aknowledged.     GERD (gastroesophageal reflux disease)    Headache 12/20/2007   Dr Daphine Deutscher  Migraines (1 per 2 wks) Topamax 400 mg/d; Imitrex prn; Ibuprofen prn, Zofran prn    Hyperglycemia 08/19/2011   Mild     Hypertension    Increased blood pressure (not hypertension) 08/23/2014   Intractable migraine 06/14/2009   Chronic Ibuprofen, Imitrex, Topamax,  Nortriptyline - intolerant   Knee pain 03/04/2011   R knee 2018 L knee pain - L knee b anserina and L knee is tender w/ROM: L knee pain is worse - patellofemoral syndrome vs other   LBP (low back pain)    Local anesthetic drug adverse reaction 01/21/2022   LOW BACK PAIN 09/29/2007   Chronic, lately (2016) disabling Due to syringomyelia On Oxycodone  Potential benefits of a long term opioids use as well as potential risks (i.e. addiction risk, apnea etc) and complications (i.e. Somnolence, constipation and others) were explained to the patient and were aknowledged.     LUQ PAIN 05/23/2010   Qualifier: Diagnosis of  By: Plotnikov MD, Georgina Quint    Migraine, chronic, without aura, intractable 10/28/2017   Botox approved diagnosis. 7/19 Worse. Start Emagility inj. Relpax   Migraines    Nausea & vomiting 08/19/2011   Nausea without vomiting 06/14/2009   Due to migraines     NECK PAIN 06/14/2009    Chronic, lately (2016) disabling Due to syringomyelia On Oxycodone  Potential benefits of a long term opioids use as well as potential risks (i.e. addiction risk, apnea etc) and complications (i.e. Somnolence, constipation and others) were explained to the patient and were aknowledged.     NONSPECIFIC ABNORMAL RESULTS LIVR FUNCTION STUDY 09/28/2009   Qualifier: Diagnosis of  By: Posey Rea MD, Georgina Quint    OBSESSIVE-COMPULSIVE DISORDER 05/23/2010   Chronic - compulsive shopping    Onychomycosis 01/30/2016   4/17    Orthostatic hypotension 06/30/2013   9/14 likely Rapaflo induced    OTITIS EXTERNA 05/10/2008   Qualifier: Diagnosis of  By: Posey Rea MD, Georgina Quint    Palpitations 06/21/2014   PARESTHESIA 07/13/2008   Qualifier: Diagnosis of  By: Posey Rea  MD, Georgina Quint    Partial thickness burn of left wrist 09/11/2015   Postoperative state 07/24/2016   Pyelonephritis 2008   Rash 07/27/2017   Stress 2009   Syncope, near 06/30/2013   9/14 likely Rapaflo induced 12/14 relapsing off Rapaflo x 7 2/15 no relapsing since on 1/2 dose of Topamax    Syringobulbia (HCC)    SYRINGOMYELIA 08/20/2007   Chronic Dr Terrace [place] Dr Haskell Riling at [facility] Chronic pain    Syringomyelia (HCC)    Tachycardia    Tonsillitis, chronic 02/26/2017   2018 worse   URINARY TRACT INFECTION (UTI) 03/23/2008   Qualifier: Diagnosis of  By: Posey Rea MD, Georgina Quint    UTI (lower urinary tract infection)    Well adult exam 12/21/2013   We  discussed age appropriate health related issues, including available/recomended screening tests and vaccinations. We discussed a need for adhering to healthy diet and exercise. Labs/EKG were reviewed/ordered. All questions were answered.        Past Surgical History:  Procedure Laterality Date   ABDOMINAL HYSTERECTOMY     ANTERIOR AND POSTERIOR REPAIR N/A 07/24/2016   Procedure: Possible ANTERIOR (CYSTOCELE) repair, perineoplasty;  Surgeon: Genia Del, MD;  Location: [facility];  Service: Gynecology;  Laterality: N/A;   BLADDER SUSPENSION N/A 07/24/2016   Procedure: TRANSVAGINAL TAPE (TVT) PROCEDURE;  Surgeon: Genia Del, MD;  Location: [facility];  Service: Gynecology;  Laterality: N/A;   COLONOSCOPY     CYSTOSCOPY N/A 07/24/2016   Procedure: CYSTOSCOPY;  Surgeon: Genia Del, MD;  Location: [facility];  Service: Gynecology;  Laterality: N/A;   FOOT SURGERY Right 2023   FOOT SURGERY Right 2023   Scraped top of foot   intracranial decompression surgery  2006   LEFT HEART CATH AND CORONARY ANGIOGRAPHY N/A 05/07/2018   Procedure: LEFT HEART CATH AND CORONARY ANGIOGRAPHY;  Surgeon: Lyn Records, MD;  Location: [facility];  Service: Cardiovascular;  Laterality: N/A;   PARTIAL KNEE ARTHROPLASTY Left 09/07/2018   Procedure:  UNICOMPARTMENTAL LEFT KNEE;  Surgeon: Sheral Apley, MD;  Location: [facility];  Service: Orthopedics;  Laterality: Left;  Adductor Block   ROBOTIC ASSISTED LAPAROSCOPIC SACROCOLPOPEXY N/A 07/24/2016   Procedure: ROBOTIC ASSISTED LAPAROSCOPIC SACROCOLPOPEXY WITH PERINEOPLASTY;  Surgeon: Genia Del, MD;  Location: [facility];  Service: Gynecology;  Laterality: N/A;   TUBAL LIGATION     Family History  Problem Relation Age of Onset   COPD Mother    Peripheral vascular disease Mother    Hypertension Mother    Heart disease Mother    Hypertension Father    Heart attack Maternal Grandmother    Stroke Maternal Grandfather    Stroke Paternal Grandfather    Breast cancer Maternal Aunt    Colon cancer Maternal Uncle 60   Anxiety disorder Other    Asthma Granddaughter    Social History   Tobacco Use   Smoking status: Never    Passive exposure: Past   Smokeless tobacco: Never  Vaping Use   Vaping status: Never Used  Substance Use Topics   Alcohol use: No   Drug use: No   Current Outpatient Medications  Medication Sig Dispense Refill   albuterol (VENTOLIN HFA) 108 (90 Base) MCG/ACT inhaler Inhale 2 puffs into the lungs every 6 (six) hours as needed for wheezing or shortness of breath. 18 g 2   azelastine (OPTIVAR) 0.05 % ophthalmic solution Apply 1 drop to eye 2 (two) times daily.     cholecalciferol (VITAMIN D) 1000 UNITS tablet Take 1,000 Units by mouth daily.     cyclobenzaprine (FLEXERIL) 5 MG tablet TAKE 1 TABLET THREE TIMES DAILY AS NEEDED FOR MUSCLE SPASMS. 90 tablet 3   estradiol (ESTRACE) 0.5 MG tablet Take 1 tablet (0.5 mg total) by mouth daily. 90 tablet 4   famotidine (PEPCID) 40 MG tablet Take 1 tablet (40 mg total) by mouth daily. 90 tablet 3   linaclotide (LINZESS) 72 MCG capsule Take 1 capsule (72 mcg total) by mouth daily before breakfast. LOT: [id number], exp:  08-2025 8 capsule 0   Multiple Vitamin (MULTIVITAMIN PO) Take 1 tablet by mouth daily.     naproxen  (NAPROSYN) 500 MG tablet Take 1 tablet (500 mg total) by mouth 2 (two) times daily with a meal. 60 tablet  3   ondansetron (ZOFRAN) 8 MG tablet TAKE ONE TABLET BY MOUTH EVERY 8 HOURS AS NEEDED FOR NAUSEA OR FOR VOMITING 21 tablet 3   Oxycodone HCl 10 MG TABS Take 1 tablet (10 mg total) by mouth every 6 (six) hours as needed. 92 tablet 0   oxymetazoline (AFRIN NASAL SPRAY) 0.05 % nasal spray Place 1 spray into both nostrils 2 (two) times daily. 30 mL 0   polyethylene glycol (MIRALAX / GLYCOLAX) packet Take 8.5 g by mouth every other day.     rizatriptan (MAXALT) 10 MG tablet TAKE 1 TABLET AT ONSET OF MIGRAINE- MAY REPEAT ONCE IN 2 HOURS. LIMIT 2/24 HOURS. AVOID DAILY USE. 8 tablet 5   senna (SENOKOT) 8.6 MG tablet Take 1 tablet by mouth as needed for constipation.     verapamil (CALAN-SR) 120 MG CR tablet Take 1 tablet (120 mg total) by mouth at bedtime. 90 tablet 3   metoprolol succinate (TOPROL XL) 25 MG 24 hr tablet Take 1 tablet (25 mg total) by mouth at bedtime. (Patient not taking: Reported on 12/08/2023) 90 tablet 3   pantoprazole (PROTONIX) 40 MG tablet Take 1 tablet (40 mg total) by mouth 2 (two) times daily before a meal.     No current facility-administered medications for this visit.   Allergies  Allergen Reactions   Atenolol Other (See Comments)    Wt gain   Cefuroxime Axetil Nausea Only and Other (See Comments)    REACTION: nausea - but tolerates PCN   Codeine Sulfate Nausea And Vomiting and Nausea Only   Fentanyl Nausea And Vomiting and Other (See Comments)    REACTION: bad reaction   Imdur [Isosorbide Nitrate] Other (See Comments)    Headaches   Nitrofurantoin Other (See Comments)    Unknown   Pregabalin Other (See Comments)    REACTION: cp   Rapaflo [Silodosin] Other (See Comments)    Orthostatic BP drop, near syncope    Topamax [Topiramate]     Tight chest    Tramadol Other (See Comments)   Tramadol Hcl Nausea And Vomiting    REACTION: sick   Iodinated Contrast  Media Rash     Review of Systems: All systems reviewed and negative except where noted in HPI.    No results found.  Physical Exam: BP 126/70   Pulse 80   Ht 5\' 4"  (1.626 m)   Wt 156 lb (70.8 kg)   BMI 26.78 kg/m  Constitutional: Pleasant,well-developed, female in no acute distress. HEENT: Normocephalic and atraumatic. Conjunctivae are normal. No scleral icterus. Neck supple.  Cardiovascular: Normal rate, regular rhythm.  Pulmonary/chest: Effort normal and breath sounds normal.  Abdominal: Soft, nondistended, nontender. There are no masses palpable.  DRE and Anoscopy - Lucius Conn as standby - small external skin tags, normal pelvic floor - no dyssynergia, no mass lesions. Small internal hemorrhoids noted on anoscopy. Extremities: no edema Neurological: Alert and oriented to person place and time. Skin: Skin is warm and dry. No rashes noted. Psychiatric: Normal mood and affect. Behavior is normal.   ASSESSMENT: 63 y.o. female here for assessment of the following  1. Opioid-induced constipation   2. Rectal bleeding   3. Gastroesophageal reflux disease, unspecified whether esophagitis present   4. Dysphagia, unspecified type   5. SVT (supraventricular tachycardia) (HCC)    Longstanding constipation since she has been on chronic opioids, suspect opioid-induced constipation.  DRE performed today which did not suggest any evidence of dyssynergia.  Anoscopy shows some small internal hemorrhoids,  this could be the source of her bleeding in the setting of constipation but I do recommend colonoscopy to further evaluate her bleeding symptoms.  Her preparation was inadequate on her last exam.  Cologuard is not a valid study in the setting of obvious/overt rectal bleeding.  We discussed what colonoscopy entails, she would need a double preparation for this particular exam.  We discussed risks and benefits.  At the same time I offered her an upper endoscopy in light of her dysphagia and  reflux symptoms.  She is agreeable to do these exams, however I would prefer she see cardiology first to make sure SVT is controlled and no further workup is needed on their end prior to scheduling.  After she has seen cardiology she will contact us for scheduling once cleared.  In the interim we discussed options for management.  She has tried a variety of over-the-counter regimens without relief.  Moderate dose of Linzess appeared perhaps too strong for her.  I gave her some free samples of Linzess at low-dose, 72 mcg daily.  She wants to try this first to see how she does.  If she does not tolerate this we can consider alternatives.  Otherwise reviewed her history of reflux and dysphagia and discussed DDx.  She is having ongoing symptoms despite once daily Protonix and nightly Pepcid.  Until we can do endoscopy had recommend she increase Protonix to 40 mg twice daily, take half hour prior to meal for a few week trial at least to see if this can provide any relief and control her symptoms better.  She agrees.  We can discuss long-term plan following her endoscopy.   PLAN: - start trial of linzess / day - if too strong or not strong enough let us know can try something else - schedule EGD and colonoscopy at the [facility]. However, will await to schedule until she can see cardiology for her SVT and clearance - trial of increasing protonix to 40mg  BID - take prior to meals until time of endoscopy   Harlin Rain, MD [facility]  CC: Plotnikov, Georgina Quint, MD

## 2023-12-08 ENCOUNTER — Encounter: Payer: Self-pay | Admitting: Gastroenterology

## 2023-12-08 ENCOUNTER — Ambulatory Visit: Payer: HMO | Admitting: Gastroenterology

## 2023-12-08 VITALS — BP 126/70 | HR 80 | Ht 64.0 in | Wt 156.0 lb

## 2023-12-08 DIAGNOSIS — K625 Hemorrhage of anus and rectum: Secondary | ICD-10-CM | POA: Diagnosis not present

## 2023-12-08 DIAGNOSIS — K219 Gastro-esophageal reflux disease without esophagitis: Secondary | ICD-10-CM

## 2023-12-08 DIAGNOSIS — R1310 Dysphagia, unspecified: Secondary | ICD-10-CM | POA: Diagnosis not present

## 2023-12-08 DIAGNOSIS — K648 Other hemorrhoids: Secondary | ICD-10-CM

## 2023-12-08 DIAGNOSIS — I471 Supraventricular tachycardia, unspecified: Secondary | ICD-10-CM | POA: Diagnosis not present

## 2023-12-08 DIAGNOSIS — T402X5A Adverse effect of other opioids, initial encounter: Secondary | ICD-10-CM

## 2023-12-08 DIAGNOSIS — K644 Residual hemorrhoidal skin tags: Secondary | ICD-10-CM

## 2023-12-08 DIAGNOSIS — K5903 Drug induced constipation: Secondary | ICD-10-CM

## 2023-12-08 MED ORDER — LINACLOTIDE 72 MCG PO CAPS: 72.0000 ug | ORAL_CAPSULE | Freq: Every day | ORAL | 0 refills | Status: DC

## 2023-12-08 MED ORDER — PANTOPRAZOLE SODIUM 40 MG PO TBEC
40.0000 mg | DELAYED_RELEASE_TABLET | Freq: Two times a day (BID) | ORAL | Status: DC
Start: 2023-12-08 — End: 2023-12-24

## 2023-12-08 NOTE — Patient Instructions (Addendum)
We have given you samples of the following medication to take: Linzess 72 mcg: Take once daily 30 minutes before a meal  Linzess works best when taken once a day every day, on an empty stomach, at least 30 minutes before your first meal of the day.  When Linzess is taken daily as directed:  *Constipation relief is typically felt in about a week *IBS-C patients may begin to experience relief from belly pain and overall abdominal symptoms (pain, discomfort, and bloating) in about 1 week,   with symptoms typically improving over 12 weeks.  Diarrhea may occur in the first 2 weeks -keep taking it.  The diarrhea should go away and you should start having normal, complete, full bowel movements. It may be helpful to start treatment when you can be near the comfort of your own bathroom, such as a weekend.    Please follow up with Cardiology and let us know if you are cleared to proceed with EGD and Colonoscopy.  Increase your Protonix to 40 mg twice daily 30 minutes before a meal.  Thank you for entrusting me with your care and for choosing [facility], Dr. Ileene Patrick    If your blood pressure at your visit was 140/90 or greater, please contact your primary care physician to follow up on this. ______________________________________________________  If you are age 52 or older, your body mass index should be between 23-30. Your Body mass index is 26.78 kg/m. If this is out of the aforementioned range listed, please consider follow up with your Primary Care Provider.  If you are age 53 or younger, your body mass index should be between 19-25. Your Body mass index is 26.78 kg/m. If this is out of the aformentioned range listed, please consider follow up with your Primary Care Provider.  ________________________________________________________  The [facility] providers would like to encourage you to use [facility] to communicate with providers for non-urgent requests or questions.  Due  to long hold times on the telephone, sending your provider a message by [facility] may be a faster and more efficient way to get a response.  Please allow 48 business hours for a response.  Please remember that this is for non-urgent requests.  _______________________________________________________  Due to recent changes in healthcare laws, you may see the results of your imaging and laboratory studies on MyChart before your provider has had a chance to review them.  We understand that in some cases there may be results that are confusing or concerning to you. Not all laboratory results come back in the same time frame and the provider may be waiting for multiple results in order to interpret others.  Please give [place] 48 hours in order for your provider to thoroughly review all the results before contacting the office for clarification of your results.

## 2023-12-11 ENCOUNTER — Other Ambulatory Visit: Payer: Self-pay | Admitting: Internal Medicine

## 2023-12-21 ENCOUNTER — Encounter: Payer: Self-pay | Admitting: Internal Medicine

## 2023-12-21 ENCOUNTER — Ambulatory Visit: Payer: HMO | Attending: Internal Medicine | Admitting: Internal Medicine

## 2023-12-21 ENCOUNTER — Other Ambulatory Visit: Payer: Self-pay | Admitting: Internal Medicine

## 2023-12-21 VITALS — BP 132/88 | HR 75 | Ht 64.0 in | Wt 157.0 lb

## 2023-12-21 DIAGNOSIS — I4719 Other supraventricular tachycardia: Secondary | ICD-10-CM

## 2023-12-21 MED ORDER — NITROGLYCERIN 0.4 MG SL SUBL
0.4000 mg | SUBLINGUAL_TABLET | SUBLINGUAL | 3 refills | Status: AC | PRN
Start: 2023-12-21 — End: ?

## 2023-12-21 NOTE — Progress Notes (Unsigned)
 -     Patient Care Team: Plotnikov, Georgina Quint, MD as PCP - General Duke Salvia, MD as PCP - Electrophysiology (Cardiology) Genia Del, MD as Consulting Physician (Obstetrics and Gynecology) Drema Dallas, DO as Consulting Physician (Neurology) Duke Salvia, MD as Consulting Physician (Cardiology) Felecia Shelling, DPM as Consulting Physician (Podiatry) Lavonna Monarch, MD as Referring Physician (Neurosurgery) Charlott Holler, MD as Consulting Physician (Pulmonary Disease)   HPI  Beth Huffman is a 63 y.o. female Seen in follow-up for symptomatic nonsustained atrial tachycardia and hypertension and progressive dyspnea for which followed by pulmonary.    She has however noted over the last 3-6 months progressive and predictable dyspnea with exertion as well as chest discomfort that is nonradiating.  She notes this both while walking,  (being walked by) her 80 pound dog as well as with swimming.  Both are relieved by rest.  Positive family history.  Positive dyslipidemia.  Has also had 2 episodes in the last 3 months while seated accompanied also by some lightheadedness.  No palpitations.    2/23 CTA>> soft tissue density prevascular lesion 1.5 cm in short axis, possibly a pathologically enlarged lymph node mediastinal Mass  CT >>  2.0 x 1.0 cm ovoid slightly lobular prevascular space mass. No other mediastinal abnormality or hilar adenopathy. This could be a pathologic lymph node PET>> without increased metabolic activity    Interval episodes of tachypalpitations associate with presyncope and residual fatigue.  At the last visit we had switched her from verapamil to diltiazem because of constipation.  She did not tolerate it secondary to tinnitus  Continues with episodes of dyspnea and chest discomfort increasing in frequency but not in intensity.  Provoked mostly by effort but not always.  Evaluation in the past has included catheterization and CTA as noted  below.  Empiric therapy with isosorbide was not tolerated secondary to headaches.  She is not on a statin.  Episode of lightheadedness associate with palpitations diaphoresis some shortness of breath and residual fatigue.  Orthostatic lightheadedness;  shower induced lightheadedness DATE TEST EF    7/19 Echo   60-65 %    7/19 LHC  65 % Normal CAs   3/23 CTA   CaScore = 0     Date Cr K Hgb  11/19 0.98 3.8 13.2   6/22 0.9 4.5 13.3  3/23 0.81 4.1          Records and Results Reviewed   Past Medical History:  Diagnosis Date   Abnormal weight gain    Acute sinusitis 12/05/2008   1/18 8/18   Arachnoid cyst 08/19/2011   Chest tightness or pressure 05/07/2018   Constipation 09/08/2012   Chronic - opioid related 11/17 Amitiza   Contact dermatitis 03/16/2013   5/14 relapsing - poison ivy    Dyspnea 05/07/2018   Elevated liver enzymes 03/04/2011   Chronic off and on    Female stress incontinence 02/06/2009   Qualifier: Diagnosis of  By: Posey Rea MD, Georgina Quint    FEVER UNSPECIFIED 09/29/2007   Qualifier: Diagnosis of  By: Posey Rea MD, Georgina Quint    GERD 09/29/2007   Chronic  Protonix prn  Potential benefits of a long term PPI use as well as potential risks  and complications were explained to the patient and were aknowledged.     GERD (gastroesophageal reflux disease)    Headache 12/20/2007   Dr Daphine Deutscher Migraines (1 per 2 wks) Topamax 400 mg/d; Imitrex prn; Ibuprofen prn, Zofran  prn    Hyperglycemia 08/19/2011   Mild     Hypertension    Increased blood pressure (not hypertension) 08/23/2014   Intractable migraine 06/14/2009   Chronic Ibuprofen, Imitrex, Topamax,  Nortriptyline - intolerant   Knee pain 03/04/2011   R knee 2018 L knee pain - L knee b anserina and L knee is tender w/ROM: L knee pain is worse - patellofemoral syndrome vs other   LBP (low back pain)    Local anesthetic drug adverse reaction 01/21/2022   LOW BACK PAIN 09/29/2007   Chronic, lately (2016) disabling Due to  syringomyelia On Oxycodone  Potential benefits of a long term opioids use as well as potential risks (i.e. addiction risk, apnea etc) and complications (i.e. Somnolence, constipation and others) were explained to the patient and were aknowledged.     LUQ PAIN 05/23/2010   Qualifier: Diagnosis of  By: Plotnikov MD, Georgina Quint    Migraine, chronic, without aura, intractable 10/28/2017   Botox approved diagnosis. 7/19 Worse. Start Emagility inj. Relpax   Migraines    Nausea & vomiting 08/19/2011   Nausea without vomiting 06/14/2009   Due to migraines     NECK PAIN 06/14/2009    Chronic, lately (2016) disabling Due to syringomyelia On Oxycodone  Potential benefits of a long term opioids use as well as potential risks (i.e. addiction risk, apnea etc) and complications (i.e. Somnolence, constipation and others) were explained to the patient and were aknowledged.     NONSPECIFIC ABNORMAL RESULTS LIVR FUNCTION STUDY 09/28/2009   Qualifier: Diagnosis of  By: Posey Rea MD, Georgina Quint    OBSESSIVE-COMPULSIVE DISORDER 05/23/2010   Chronic - compulsive shopping    Onychomycosis 01/30/2016   4/17    Orthostatic hypotension 06/30/2013   9/14 likely Rapaflo induced    OTITIS EXTERNA 05/10/2008   Qualifier: Diagnosis of  By: Posey Rea MD, Georgina Quint    Palpitations 06/21/2014   PARESTHESIA 07/13/2008   Qualifier: Diagnosis of  By: Posey Rea MD, Georgina Quint    Partial thickness burn of left wrist 09/11/2015   Postoperative state 07/24/2016   Pyelonephritis 2008   Rash 07/27/2017   Stress 2009   Syncope, near 06/30/2013   9/14 likely Rapaflo induced 12/14 relapsing off Rapaflo x 7 2/15 no relapsing since on 1/2 dose of Topamax    Syringobulbia (HCC)    SYRINGOMYELIA 08/20/2007   Chronic Dr [place] Dr Haskell Riling at [facility] Chronic pain    Syringomyelia (HCC)    Tachycardia    Tonsillitis, chronic 02/26/2017   2018 worse   URINARY TRACT INFECTION (UTI) 03/23/2008   Qualifier: Diagnosis of  By: Posey Rea MD, Georgina Quint    UTI (lower  urinary tract infection)    Well adult exam 12/21/2013   We discussed age appropriate health related issues, including available/recomended screening tests and vaccinations. We discussed a need for adhering to healthy diet and exercise. Labs/EKG were reviewed/ordered. All questions were answered.       Past Surgical History:  Procedure Laterality Date   ABDOMINAL HYSTERECTOMY     ANTERIOR AND POSTERIOR REPAIR N/A 07/24/2016   Procedure: Possible ANTERIOR (CYSTOCELE) repair, perineoplasty;  Surgeon: Genia Del, MD;  Location: [facility];  Service: Gynecology;  Laterality: N/A;   BLADDER SUSPENSION N/A 07/24/2016   Procedure: TRANSVAGINAL TAPE (TVT) PROCEDURE;  Surgeon: Genia Del, MD;  Location: [facility];  Service: Gynecology;  Laterality: N/A;   COLONOSCOPY     CYSTOSCOPY N/A 07/24/2016   Procedure: CYSTOSCOPY;  Surgeon: Genia Del, MD;  Location: [facility];  Service: Gynecology;  Laterality: N/A;   FOOT SURGERY Right 2023   FOOT SURGERY Right 2023   Scraped top of foot   intracranial decompression surgery  2006   LEFT HEART CATH AND CORONARY ANGIOGRAPHY N/A 05/07/2018   Procedure: LEFT HEART CATH AND CORONARY ANGIOGRAPHY;  Surgeon: Lyn Records, MD;  Location: [facility];  Service: Cardiovascular;  Laterality: N/A;   PARTIAL KNEE ARTHROPLASTY Left 09/07/2018   Procedure: UNICOMPARTMENTAL LEFT KNEE;  Surgeon: Sheral Apley, MD;  Location: [facility];  Service: Orthopedics;  Laterality: Left;  Adductor Block   ROBOTIC ASSISTED LAPAROSCOPIC SACROCOLPOPEXY N/A 07/24/2016   Procedure: ROBOTIC ASSISTED LAPAROSCOPIC SACROCOLPOPEXY WITH PERINEOPLASTY;  Surgeon: Genia Del, MD;  Location: [facility];  Service: Gynecology;  Laterality: N/A;   TUBAL LIGATION      Current Outpatient Medications  Medication Sig Dispense Refill   albuterol (VENTOLIN HFA) 108 (90 Base) MCG/ACT inhaler Inhale 2 puffs into the lungs every 6 (six) hours as needed for wheezing or shortness of  breath. 18 g 2   cholecalciferol (VITAMIN D) 1000 UNITS tablet Take 1,000 Units by mouth daily.     cyclobenzaprine (FLEXERIL) 5 MG tablet TAKE 1 TABLET THREE TIMES DAILY AS NEEDED FOR MUSCLE SPASMS. 90 tablet 3   estradiol (ESTRACE) 0.5 MG tablet Take 1 tablet (0.5 mg total) by mouth daily. 90 tablet 4   famotidine (PEPCID) 40 MG tablet Take 1 tablet (40 mg total) by mouth daily. 90 tablet 3   linaclotide (LINZESS) 72 MCG capsule Take 1 capsule (72 mcg total) by mouth daily before breakfast. LOT: [id number], exp:  08-2025 8 capsule 0   Multiple Vitamin (MULTIVITAMIN PO) Take 1 tablet by mouth daily.     naproxen (NAPROSYN) 500 MG tablet Take 1 tablet (500 mg total) by mouth 2 (two) times daily with a meal. 60 tablet 3   ondansetron (ZOFRAN) 8 MG tablet TAKE ONE TABLET BY MOUTH EVERY 8 HOURS AS NEEDED FOR NAUSEA OR FOR VOMITING 21 tablet 3   Oxycodone HCl 10 MG TABS Take 1 tablet (10 mg total) by mouth every 6 (six) hours as needed. 92 tablet 0   oxymetazoline (AFRIN NASAL SPRAY) 0.05 % nasal spray Place 1 spray into both nostrils 2 (two) times daily. 30 mL 0   pantoprazole (PROTONIX) 40 MG tablet Take 1 tablet (40 mg total) by mouth 2 (two) times daily before a meal.     phentermine (ADIPEX-P) 37.5 MG tablet Take 1 tablet (37.5 mg total) by mouth daily before breakfast. 30 tablet 3   polyethylene glycol (MIRALAX / GLYCOLAX) packet Take 8.5 g by mouth every other day.     rizatriptan (MAXALT) 10 MG tablet TAKE 1 TABLET AT ONSET OF MIGRAINE- MAY REPEAT ONCE IN 2 HOURS. LIMIT 2/24 HOURS. AVOID DAILY USE. 8 tablet 5   senna (SENOKOT) 8.6 MG tablet Take 1 tablet by mouth as needed for constipation.     verapamil (CALAN-SR) 120 MG CR tablet Take 1 tablet (120 mg total) by mouth at bedtime. 90 tablet 3   metoprolol succinate (TOPROL XL) 25 MG 24 hr tablet Take 1 tablet (25 mg total) by mouth at bedtime. (Patient not taking: Reported on 12/21/2023) 90 tablet 3   No current facility-administered medications  for this visit.    Allergies  Allergen Reactions   Atenolol Other (See Comments)    Wt gain   Cefuroxime Axetil Nausea Only and Other (See Comments)    REACTION: nausea -  but tolerates PCN   Codeine Sulfate Nausea And Vomiting and Nausea Only   Fentanyl Nausea And Vomiting and Other (See Comments)    REACTION: bad reaction   Imdur [Isosorbide Nitrate] Other (See Comments)    Headaches   Nitrofurantoin Other (See Comments)    Unknown   Pregabalin Other (See Comments)    REACTION: cp   Rapaflo [Silodosin] Other (See Comments)    Orthostatic BP drop, near syncope    Topamax [Topiramate]     Tight chest    Tramadol Other (See Comments)   Tramadol Hcl Nausea And Vomiting    REACTION: sick   Iodinated Contrast Media Rash      Review of Systems negative except from HPI and PMH  Physical Exam BP 132/88   Pulse 75   Ht 5\' 4"  (1.626 m)   Wt 157 lb (71.2 kg)   SpO2 97%   BMI 26.95 kg/m  Well developed and nourished in no acute distress HENT normal Neck supple with JVP-  7 Clear Regular rate and rhythm, no murmurs or gallops Abd-soft with active BS No Clubbing cyanosis edema Skin-warm and dry A & Oriented  Grossly normal sensory and motor function  ECG sinus @ 75 14/10/38    Assessment and  Plan  Shortness of breath  Hypertension   Atrial tachycardia  Palpitations  HA migraines  Syringomyelia  Dyspnea continues to be a problem and increasingly so.  It has different manifestations including worse left side down trying to sleep suggesting a cardiovascular issue which might be supported by her mildly elevated neck veins but is discordant with her echo from a couple years ago with a normal E/E' and her LVEDP albeit 6 years ago with similar but not as severe symptoms was 1.  She also has had anatomical issues in her chest.  And prior to considering a right heart catheterization with right side down versus left-sided measurements, we will have her follow-up with  pulmonary to see whether there is anything mechanical.  Blood pressure is well-controlled on the verapamil.  I wonder whether her chest pain shortness of breath syndrome may not be INOCA given its reproducibility with effort.  Strategies would include nitroglycerin, we tried this a few years ago it was complicated by headaches and we discussed the issue of tolerance today.  In the short-term we will try SL.  She is advised to take it first at home and is advised also that it can cause hypotension so she needs to be aware of this when she takes it when she is out walking.  If she is unable to tolerate the nitroglycerin because of headaches or it is ineffective, we will discontinue it, consider discontinuing her flecainide, and using the ACE inhibitor statin combination which has been shown to be clinically beneficial.  Her episodes of lightheadedness were not recorded during her event recorder.  There remains a possibility that atrial tachycardia is a trigger for this; in this regard we discussed the possible use of an implantable loop recorder to identify presymptom triggers.  In the event that it were an atrial tachycardia, we could consider using flecainide.  Notably, most of the triggered events on her event recorder were associated with sinus rhythm although she did have ventricular couplets and isolated SVE.      Dyspnea seems to be better.  Followed by pulmonary.  Mostly seasonal   Blood pressure well-controlled.  With the constipation we are going to discontinue her verapamil and try her on  diltiazem 120 as an alternative for both blood pressure as well as her palpitations presumably the atrial tachycardia.  Typically the mechanism of lightheadedness with this is decreased venous return in the triggering of J fibers initiating a vasomotor response.  In her mind the constipation aggravates her syrinx as well as her headaches, and maybe we can make this better

## 2023-12-21 NOTE — Patient Instructions (Signed)
 Medication Instructions:  Your physician has recommended you make the following change in your medication:   ** Begin Nitroglycerine 0.4mg  - 1 tablet by mouth under the tongue every 5 minutes for up to 3 doses for chest pain.  *If you need a refill on your cardiac medications before your next appointment, please call your pharmacy*   Lab Work: None ordered.  If you have labs (blood work) drawn today and your tests are completely normal, you will receive your results only by: MyChart Message (if you have MyChart) OR A paper copy in the mail If you have any lab test that is abnormal or we need to change your treatment, we will call you to review the results.   Testing/Procedures: None ordered.    Follow-Up: At [facility], you and your health needs are our priority.  As part of our continuing mission to provide you with exceptional heart care, we have created designated Provider Care Teams.  These Care Teams include your primary Cardiologist (physician) and Advanced Practice Providers (APPs -  Physician Assistants and Nurse Practitioners) who all work together to provide you with the care you need, when you need it.  We recommend signing up for the patient portal called "MyChart".  Sign up information is provided on this After Visit Summary.  MyChart is used to connect with patients for Virtual Visits (Telemedicine).  Patients are able to view lab/test results, encounter notes, upcoming appointments, etc.  Non-urgent messages can be sent to your provider as well.   To learn more about what you can do with MyChart, go to [email or web address].    Your next appointment:   8 week follow up with Dr Odessa Fleming PA Francis Dowse, Otilio Saber or Canary Brim

## 2023-12-24 ENCOUNTER — Other Ambulatory Visit: Payer: Self-pay | Admitting: Gastroenterology

## 2023-12-29 ENCOUNTER — Telehealth: Payer: Self-pay

## 2023-12-29 NOTE — Telephone Encounter (Signed)
 PA needed for botox appt 3/14

## 2023-12-30 ENCOUNTER — Encounter: Payer: Self-pay | Admitting: Internal Medicine

## 2023-12-30 ENCOUNTER — Telehealth: Payer: Self-pay | Admitting: Pharmacy Technician

## 2023-12-30 ENCOUNTER — Other Ambulatory Visit (HOSPITAL_COMMUNITY): Payer: Self-pay

## 2023-12-30 ENCOUNTER — Ambulatory Visit: Payer: Medicare Other | Admitting: Internal Medicine

## 2023-12-30 VITALS — BP 118/78 | HR 85 | Temp 98.0°F | Ht 64.0 in | Wt 157.0 lb

## 2023-12-30 DIAGNOSIS — G950 Syringomyelia and syringobulbia: Secondary | ICD-10-CM

## 2023-12-30 DIAGNOSIS — M5442 Lumbago with sciatica, left side: Secondary | ICD-10-CM

## 2023-12-30 DIAGNOSIS — R110 Nausea: Secondary | ICD-10-CM | POA: Diagnosis not present

## 2023-12-30 DIAGNOSIS — G441 Vascular headache, not elsewhere classified: Secondary | ICD-10-CM | POA: Diagnosis not present

## 2023-12-30 DIAGNOSIS — G43709 Chronic migraine without aura, not intractable, without status migrainosus: Secondary | ICD-10-CM

## 2023-12-30 DIAGNOSIS — G8929 Other chronic pain: Secondary | ICD-10-CM

## 2023-12-30 DIAGNOSIS — M5441 Lumbago with sciatica, right side: Secondary | ICD-10-CM | POA: Diagnosis not present

## 2023-12-30 MED ORDER — OXYCODONE HCL 10 MG PO TABS: 10.0000 mg | ORAL_TABLET | Freq: Four times a day (QID) | ORAL | 0 refills | Status: DC | PRN

## 2023-12-30 MED ORDER — AIMOVIG 140 MG/ML ~~LOC~~ SOAJ: 140.0000 mg | SUBCUTANEOUS | 5 refills | Status: DC

## 2023-12-30 NOTE — Progress Notes (Signed)
 Subjective:  Patient ID: Beth Huffman, female    DOB: Sep 30, 1961  Age: 63 y.o. MRN: [id number]  CC: Medical Management of Chronic Issues (3 mnth f/u Discuss restarting medication for migraines)   HPI Beth Huffman presents for chronic pain, HAs 10-15/month, GERD  Outpatient Medications Prior to Visit  Medication Sig Dispense Refill   albuterol (VENTOLIN HFA) 108 (90 Base) MCG/ACT inhaler Inhale 2 puffs into the lungs every 6 (six) hours as needed for wheezing or shortness of breath. 18 g 2   cholecalciferol (VITAMIN D) 1000 UNITS tablet Take 1,000 Units by mouth daily.     cyclobenzaprine (FLEXERIL) 5 MG tablet TAKE 1 TABLET THREE TIMES DAILY AS NEEDED FOR MUSCLE SPASMS. 90 tablet 3   estradiol (ESTRACE) 0.5 MG tablet Take 1 tablet (0.5 mg total) by mouth daily. 90 tablet 4   famotidine (PEPCID) 40 MG tablet Take 1 tablet (40 mg total) by mouth daily. 90 tablet 3   linaclotide (LINZESS) 72 MCG capsule Take 1 capsule (72 mcg total) by mouth daily before breakfast. LOT: [id number], exp:  08-2025 8 capsule 0   Multiple Vitamin (MULTIVITAMIN PO) Take 1 tablet by mouth daily.     naproxen (NAPROSYN) 500 MG tablet Take 1 tablet (500 mg total) by mouth 2 (two) times daily with a meal. 60 tablet 3   nitroGLYCERIN (NITROSTAT) 0.4 MG SL tablet Place 1 tablet (0.4 mg total) under the tongue every 5 (five) minutes as needed for chest pain. 25 tablet 3   ondansetron (ZOFRAN) 8 MG tablet TAKE ONE TABLET BY MOUTH EVERY 8 HOURS AS NEEDED FOR NAUSEA OR FOR VOMITING 21 tablet 3   oxymetazoline (AFRIN NASAL SPRAY) 0.05 % nasal spray Place 1 spray into both nostrils 2 (two) times daily. 30 mL 0   pantoprazole (PROTONIX) 40 MG tablet Take 1 tablet (40 mg total) by mouth 2 (two) times daily before a meal. 60 tablet 2   phentermine (ADIPEX-P) 37.5 MG tablet Take 1 tablet (37.5 mg total) by mouth daily before breakfast. 30 tablet 3   polyethylene glycol (MIRALAX / GLYCOLAX) packet Take 8.5 g by mouth every  other day.     rizatriptan (MAXALT) 10 MG tablet TAKE 1 TABLET AT ONSET OF MIGRAINE- MAY REPEAT ONCE IN 2 HOURS. LIMIT 2/24 HOURS. AVOID DAILY USE. 8 tablet 5   senna (SENOKOT) 8.6 MG tablet Take 1 tablet by mouth as needed for constipation.     verapamil (CALAN-SR) 120 MG CR tablet Take 1 tablet (120 mg total) by mouth at bedtime. 90 tablet 3   Oxycodone HCl 10 MG TABS Take 1 tablet (10 mg total) by mouth every 6 (six) hours as needed. 92 tablet 0   metoprolol succinate (TOPROL XL) 25 MG 24 hr tablet Take 1 tablet (25 mg total) by mouth at bedtime. (Patient not taking: Reported on 12/21/2023) 90 tablet 3   No facility-administered medications prior to visit.    ROS: Review of Systems  Constitutional:  Negative for activity change, appetite change, chills, fatigue and unexpected weight change.  HENT:  Negative for congestion, mouth sores and sinus pressure.   Eyes:  Negative for visual disturbance.  Respiratory:  Negative for cough and chest tightness.   Gastrointestinal:  Negative for abdominal pain and nausea.  Genitourinary:  Negative for difficulty urinating, frequency and vaginal pain.  Musculoskeletal:  Positive for back pain and gait problem.  Skin:  Negative for pallor and rash.  Neurological:  Positive for headaches. Negative for dizziness,  tremors, weakness and numbness.  Psychiatric/Behavioral:  Negative for confusion and sleep disturbance.     Objective:  BP 118/78   Pulse 85   Temp 98 F (36.7 C) (Oral)   Ht 5\' 4"  (1.626 m)   Wt 157 lb (71.2 kg)   SpO2 98%   BMI 26.95 kg/m   BP Readings from Last 3 Encounters:  12/30/23 118/78  12/21/23 132/88  12/08/23 126/70    Wt Readings from Last 3 Encounters:  12/30/23 157 lb (71.2 kg)  12/21/23 157 lb (71.2 kg)  12/08/23 156 lb (70.8 kg)    Physical Exam Constitutional:      General: She is not in acute distress.    Appearance: Normal appearance. She is well-developed.  HENT:     Head: Normocephalic.     Right  Ear: External ear normal.     Left Ear: External ear normal.     Nose: Nose normal.  Eyes:     General:        Right eye: No discharge.        Left eye: No discharge.     Conjunctiva/sclera: Conjunctivae normal.     Pupils: Pupils are equal, round, and reactive to light.  Neck:     Thyroid: No thyromegaly.     Vascular: No JVD.     Trachea: No tracheal deviation.  Cardiovascular:     Rate and Rhythm: Normal rate and regular rhythm.     Heart sounds: Normal heart sounds.  Pulmonary:     Effort: No respiratory distress.     Breath sounds: No stridor. No wheezing.  Abdominal:     General: Bowel sounds are normal. There is no distension.     Palpations: Abdomen is soft. There is no mass.     Tenderness: There is no abdominal tenderness. There is no guarding or rebound.  Musculoskeletal:        General: Tenderness present.     Cervical back: Normal range of motion and neck supple. No rigidity.  Lymphadenopathy:     Cervical: No cervical adenopathy.  Skin:    Findings: No erythema or rash.  Neurological:     Cranial Nerves: No cranial nerve deficit.     Motor: No abnormal muscle tone.     Coordination: Coordination normal.     Deep Tendon Reflexes: Reflexes normal.  Psychiatric:        Behavior: Behavior normal.        Thought Content: Thought content normal.        Judgment: Judgment normal.     Lab Results  Component Value Date   WBC 4.6 07/06/2023   HGB 13.0 07/06/2023   HCT 40.5 07/06/2023   PLT 223.0 07/06/2023   GLUCOSE 87 07/06/2023   CHOL 203 (H) 07/06/2023   TRIG 79.0 07/06/2023   HDL 63.30 07/06/2023   LDLDIRECT 130.2 12/21/2013   LDLCALC 124 (H) 07/06/2023   ALT 16 07/06/2023   AST 23 07/06/2023   NA 140 07/06/2023   K 4.2 07/06/2023   CL 104 07/06/2023   CREATININE 0.94 07/06/2023   BUN 11 07/06/2023   CO2 30 07/06/2023   TSH 1.72 07/06/2023   HGBA1C 5.5 03/31/2018    MM 3D DIAGNOSTIC MAMMOGRAM BILATERAL BREAST Result Date: 03/31/2023 CLINICAL  DATA:  63 year old female presenting for evaluation of a new lump in the inferior right breast and for annual exam of the left breast. EXAM: DIGITAL DIAGNOSTIC BILATERAL MAMMOGRAM WITH TOMOSYNTHESIS; ULTRASOUND RIGHT BREAST LIMITED TECHNIQUE:  Bilateral digital diagnostic mammography and breast tomosynthesis was performed.; Targeted ultrasound examination of the right breast was performed COMPARISON:  Previous exam(s). ACR Breast Density Category c: The breasts are heterogeneously dense, which may obscure small masses. FINDINGS: Mammogram: Right breast: A skin BB marks the palpable site of concern reported by the patient in the central inferior right breast. Spot tangential view of this area was performed in addition to standard views. There is no new abnormality at the palpable site or elsewhere in the right breast is suggested presence of malignancy. Left breast: No suspicious mass, distortion, or microcalcifications are identified to suggest presence of malignancy. On physical exam at the site of concern reported by the patient in the central inferior right breast I do not feel a discrete mass or focal area of thickening. Ultrasound: Targeted ultrasound performed at the palpable site of concern in the right breast at 7 o'clock 6 cm from the nipple demonstrating no cystic or solid mass. IMPRESSION: 1. No mammographic or sonographic evidence of malignancy at the palpable site of concern in the right breast. 2. No mammographic evidence of malignancy in the left breast. RECOMMENDATION: 1. Recommend any further workup of the palpable site in the right breast be on a clinical basis given negative imaging findings. 2.  Screening mammogram in one year.(Code:SM-B-01Y) I have discussed the findings and recommendations with the patient. If applicable, a reminder letter will be sent to the patient regarding the next appointment. BI-RADS CATEGORY  1: Negative. Electronically Signed   By: Emmaline Kluver M.D.   On: 03/31/2023  09:26   [place] LIMITED ULTRASOUND INCLUDING AXILLA RIGHT BREAST Result Date: 03/31/2023 CLINICAL DATA:  63 year old female presenting for evaluation of a new lump in the inferior right breast and for annual exam of the left breast. EXAM: DIGITAL DIAGNOSTIC BILATERAL MAMMOGRAM WITH TOMOSYNTHESIS; ULTRASOUND RIGHT BREAST LIMITED TECHNIQUE: Bilateral digital diagnostic mammography and breast tomosynthesis was performed.; Targeted ultrasound examination of the right breast was performed COMPARISON:  Previous exam(s). ACR Breast Density Category c: The breasts are heterogeneously dense, which may obscure small masses. FINDINGS: Mammogram: Right breast: A skin BB marks the palpable site of concern reported by the patient in the central inferior right breast. Spot tangential view of this area was performed in addition to standard views. There is no new abnormality at the palpable site or elsewhere in the right breast is suggested presence of malignancy. Left breast: No suspicious mass, distortion, or microcalcifications are identified to suggest presence of malignancy. On physical exam at the site of concern reported by the patient in the central inferior right breast I do not feel a discrete mass or focal area of thickening. Ultrasound: Targeted ultrasound performed at the palpable site of concern in the right breast at 7 o'clock 6 cm from the nipple demonstrating no cystic or solid mass. IMPRESSION: 1. No mammographic or sonographic evidence of malignancy at the palpable site of concern in the right breast. 2. No mammographic evidence of malignancy in the left breast. RECOMMENDATION: 1. Recommend any further workup of the palpable site in the right breast be on a clinical basis given negative imaging findings. 2.  Screening mammogram in one year.(Code:SM-B-01Y) I have discussed the findings and recommendations with the patient. If applicable, a reminder letter will be sent to the patient regarding the next appointment.  BI-RADS CATEGORY  1: Negative. Electronically Signed   By: Emmaline Kluver M.D.   On: 03/31/2023 09:26   Assessment & Plan:   Problem List Items Addressed  This Visit     SYRINGOMYELIA - Primary   Seems to be stable.  No new symptoms      Migraine headache   Migraines 10-15/month - worse Maxalt, Ibuprofen prn, Zofran prn Re-start Amovig      Relevant Medications   Erenumab-aooe (AIMOVIG) 140 MG/ML SOAJ   Oxycodone HCl 10 MG TABS   Oxycodone HCl 10 MG TABS   Oxycodone HCl 10 MG TABS   LOW BACK PAIN    Due to syringomyelia On Oxycodone  Potential benefits of a long term opioids use as well as potential risks (i.e. addiction risk, apnea etc) and complications (i.e. Somnolence, constipation and others) were explained to the patient and were aknowledged.      Relevant Medications   Oxycodone HCl 10 MG TABS   Oxycodone HCl 10 MG TABS   Oxycodone HCl 10 MG TABS   Headache   Migraines 10-15/month - worse Maxalt, Ibuprofen prn, Zofran prn Re-start Amovig      Relevant Medications   Erenumab-aooe (AIMOVIG) 140 MG/ML SOAJ   Oxycodone HCl 10 MG TABS   Oxycodone HCl 10 MG TABS   Oxycodone HCl 10 MG TABS   Nausea without vomiting   Due to migraines         Meds ordered this encounter  Medications   Erenumab-aooe (AIMOVIG) 140 MG/ML SOAJ    Sig: Inject 140 mg into the skin every 28 (twenty-eight) days.    Dispense:  1.12 mL    Refill:  5   Oxycodone HCl 10 MG TABS    Sig: Take 1 tablet (10 mg total) by mouth every 6 (six) hours as needed.    Dispense:  120 tablet    Refill:  0    Please fill on or after 02/08/2024   Oxycodone HCl 10 MG TABS    Sig: Take 1 tablet (10 mg total) by mouth every 6 (six) hours as needed.    Dispense:  120 tablet    Refill:  0    Please fill on or after 01/09/2024   Oxycodone HCl 10 MG TABS    Sig: Take 1 tablet (10 mg total) by mouth every 6 (six) hours as needed.    Dispense:  120 tablet    Refill:  0    Please fill on or after  03/10/2024      Follow-up: Return in about 3 months (around 03/31/2024) for a follow-up visit.  Sonda Primes, MD

## 2023-12-30 NOTE — Assessment & Plan Note (Signed)
 Migraines 10-15/month - worse Maxalt, Ibuprofen prn, Zofran prn Re-start Amovig

## 2023-12-30 NOTE — Assessment & Plan Note (Signed)
Seems to be stable.  No new symptoms

## 2023-12-30 NOTE — Assessment & Plan Note (Signed)
Due to syringomyelia On Oxycodone  Potential benefits of a long term opioids use as well as potential risks (i.e. addiction risk, apnea etc) and complications (i.e. Somnolence, constipation and others) were explained to the patient and were aknowledged. 

## 2023-12-30 NOTE — Assessment & Plan Note (Signed)
 Due to migraines

## 2023-12-30 NOTE — Telephone Encounter (Signed)
 Pharmacy Patient Advocate Encounter  BotoxOne verification has been submitted. Benefit Verification #:  [id number]  Pharmacy PA has been submitted for BOTOX 200u via CoverMyMeds. INSURANCE: [employer] ADVANTAGE/RX ADVANCE DATE SUBMITTED: 3.5.25 KEY: [id number] Status is pending

## 2023-12-31 ENCOUNTER — Other Ambulatory Visit (HOSPITAL_COMMUNITY): Payer: Self-pay

## 2023-12-31 ENCOUNTER — Encounter: Payer: Self-pay | Admitting: Neurology

## 2023-12-31 ENCOUNTER — Encounter: Payer: Self-pay | Admitting: Internal Medicine

## 2023-12-31 NOTE — Telephone Encounter (Signed)
 Pharmacy Patient Advocate Encounter  Received notification from [facility] ADVANTAGE/RX ADVANCE that Prior Authorization for BOTOX 200 has been DENIED.  See denial reason below. No denial letter attached in CMM. Will attach denial letter to Media tab once received.   PA #/Case ID/Reference #: [id number]

## 2023-12-31 NOTE — Telephone Encounter (Signed)
 Pharmacy Patient Advocate Encounter- Botox BIV-Medical Benefit:  Buy/Bill J code: W2956 CPT code: 21308 Dx Code: G43.709  PA NOT NEEDED Call reference # [id number]  Estimated Patient cost is: $5 copay 20% coinsurance Out of Pocket maximum: $3400 Paid our Pocket: $15  Patient IS NOT eligible for Botox Copay Card. Copay Card can make patient's cost as little as zero. Copay card will be provided to pharmacy.

## 2024-01-01 ENCOUNTER — Other Ambulatory Visit (HOSPITAL_COMMUNITY): Payer: Self-pay

## 2024-01-03 ENCOUNTER — Other Ambulatory Visit: Payer: Self-pay | Admitting: Internal Medicine

## 2024-01-03 MED ORDER — OXYCODONE HCL 10 MG PO TABS: 10.0000 mg | ORAL_TABLET | Freq: Four times a day (QID) | ORAL | 0 refills | Status: DC | PRN

## 2024-01-04 ENCOUNTER — Other Ambulatory Visit (HOSPITAL_COMMUNITY): Payer: Self-pay

## 2024-01-08 ENCOUNTER — Ambulatory Visit: Payer: HMO | Admitting: Neurology

## 2024-01-27 ENCOUNTER — Other Ambulatory Visit: Payer: Self-pay | Admitting: Internal Medicine

## 2024-01-27 ENCOUNTER — Other Ambulatory Visit: Payer: Self-pay

## 2024-01-27 ENCOUNTER — Encounter: Payer: Self-pay | Admitting: Gastroenterology

## 2024-01-27 MED ORDER — LINACLOTIDE 72 MCG PO CAPS: 72.0000 ug | ORAL_CAPSULE | Freq: Every day | ORAL | 5 refills | Status: DC

## 2024-01-27 NOTE — Progress Notes (Signed)
 Refill of Linzess sent to [facility] per MyChart patient message request

## 2024-01-28 ENCOUNTER — Other Ambulatory Visit (HOSPITAL_COMMUNITY): Payer: Self-pay

## 2024-01-28 ENCOUNTER — Telehealth: Payer: Self-pay

## 2024-01-28 NOTE — Telephone Encounter (Signed)
 Pharmacy Patient Advocate Encounter  Received notification from [facility] ADVANTAGE/RX ADVANCE that Prior Authorization for Aimovig 140MG /ML auto-injectors has been APPROVED from 01/28/24 to 01/27/25. Ran test claim, Copay is $45. This test claim was processed through [facility]- copay amounts may vary at other pharmacies due to pharmacy/plan contracts, or as the patient moves through the different stages of their insurance plan.   PA #/Case ID/Reference #: [id number]

## 2024-01-29 ENCOUNTER — Other Ambulatory Visit (HOSPITAL_COMMUNITY): Payer: Self-pay

## 2024-02-01 ENCOUNTER — Telehealth: Payer: Self-pay | Admitting: Pharmacy Technician

## 2024-02-01 ENCOUNTER — Other Ambulatory Visit (HOSPITAL_COMMUNITY): Payer: Self-pay

## 2024-02-01 NOTE — Telephone Encounter (Signed)
 Pharmacy Patient Advocate Encounter   BotoxOne verification has been Submitted Benefit Verification #:  [id number]   Dx Code: G43.709 J-code: Z6109 Procedure code: 60454  Medical Benefit PA has been submitted for Botox via Fax.  INSURANCE: [employer] MEDICARE KEY/EOC/FAX: [phone] Status is Pending

## 2024-02-01 NOTE — Telephone Encounter (Signed)
 AIMOVIG and NURTEC does not require a PA. Botox is pending with [employer].

## 2024-02-01 NOTE — Telephone Encounter (Signed)
 [employer] called and stated that pt can get as [place]. However, they provided a number to Part C, where patient may be eligible to get through pharmacy as a medical benefit. Fax number was provided [phone]) and has been faxed to them.

## 2024-02-01 NOTE — Telephone Encounter (Signed)
 PA not needed for Nurtec or Aimovig. Pharmacy Patient Advocate Encounter  Insurance verification completed.   The patient is insured through [employer]    Ran test claim for NURTEC 75MG . Currently a quantity of 16 is a 30 day supply and the co-pay is $45 .  This test claim was processed through [facility]- copay amounts may vary at other pharmacies due to pharmacy/plan contracts, or as the patient moves through the different stages of their insurance plan.   Pharmacy Patient Advocate Encounter  Insurance verification completed.   The patient is insured through [employer]    Ran test claim for AIMOVIG 140MG . Currently a quantity of 1 is a 28 day supply and the co-pay is $45 .   This test claim was processed through [facility]- copay amounts may vary at other pharmacies due to pharmacy/plan contracts, or as the patient moves through the different stages of their insurance plan.

## 2024-02-03 ENCOUNTER — Encounter: Payer: Self-pay | Admitting: Internal Medicine

## 2024-02-03 ENCOUNTER — Ambulatory Visit: Payer: HMO | Admitting: Internal Medicine

## 2024-02-03 VITALS — BP 130/90 | HR 77 | Ht 65.0 in | Wt 156.0 lb

## 2024-02-03 DIAGNOSIS — I4719 Other supraventricular tachycardia: Secondary | ICD-10-CM | POA: Diagnosis not present

## 2024-02-03 DIAGNOSIS — J4520 Mild intermittent asthma, uncomplicated: Secondary | ICD-10-CM

## 2024-02-03 MED ORDER — FLUTICASONE FUROATE-VILANTEROL 100-25 MCG/ACT IN AEPB: 1.0000 | INHALATION_SPRAY | Freq: Every day | RESPIRATORY_TRACT | 5 refills | Status: AC

## 2024-02-03 MED ORDER — LEVALBUTEROL TARTRATE 45 MCG/ACT IN AERO: 2.0000 | INHALATION_SPRAY | Freq: Four times a day (QID) | RESPIRATORY_TRACT | 12 refills | Status: DC | PRN

## 2024-02-03 NOTE — Telephone Encounter (Signed)
 Pharmacy Patient Advocate Encounter  Received notification from  [facility] MEDICARE  that Prior Authorization for Botox has been APPROVED from 02/01/2024 to 01/30/2025

## 2024-02-03 NOTE — Progress Notes (Signed)
 Beth Huffman    [id number]    20-Apr-1961  Primary Care Physician:Plotnikov, Georgina Quint, MD Date of Appointment: 02/03/2024 Established Patient Visit  Chief complaint:   Chief Complaint  Patient presents with   Follow-up    Referral by cardiologist for sob on exertion. Happens when she is sitting and lying in bed. Started couple years      HPI: Beth Huffman is a 63 y.o. 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 Felt to have mild intermittent asthma.  Interval Updates: Here for follow up since 2023. Breathing doing ok.   Using albuterol inhaler about once a month. Continues to help her symptoms of shortness of breath. Uses more often with gardening. Would like to get back into swimming and walking. Her arthritis also limits her mobility.   No interval hospitalizations or exacerbations related to breathing.  Here with her husband. Continues to garden, go antiquing, spending time with grandchildren.  Asthma Control Test ACT Total Score  02/03/2024 10:18 AM 19     I have reviewed the patient's family social and past medical history and updated as appropriate.   Past Medical History:  Diagnosis Date   Abnormal weight gain    Acute sinusitis 12/05/2008   1/18 8/18   Arachnoid cyst 08/19/2011   Chest tightness or pressure 05/07/2018   Constipation 09/08/2012   Chronic - opioid related 11/17 Amitiza   Contact dermatitis 03/16/2013   5/14 relapsing - poison ivy    Dyspnea 05/07/2018   Elevated liver enzymes 03/04/2011   Chronic off and on    Female stress incontinence 02/06/2009   Qualifier: Diagnosis of  By: Posey Rea MD, Georgina Quint    FEVER UNSPECIFIED 09/29/2007   Qualifier: Diagnosis of  By: Posey Rea MD, Georgina Quint    GERD 09/29/2007   Chronic  Protonix prn  Potential benefits of a long term PPI use as well as potential risks  and  complications were explained to the patient and were aknowledged.     GERD (gastroesophageal reflux disease)    Headache 12/20/2007   Dr Daphine Deutscher Migraines (1 per 2 wks) Topamax 400 mg/d; Imitrex prn; Ibuprofen prn, Zofran prn    Hyperglycemia 08/19/2011   Mild     Hypertension    Increased blood pressure (not hypertension) 08/23/2014   Intractable migraine 06/14/2009   Chronic Ibuprofen, Imitrex, Topamax,  Nortriptyline - intolerant   Knee pain 03/04/2011   R knee 2018 L knee pain - L knee b anserina and L knee is tender w/ROM: L knee pain is worse - patellofemoral syndrome vs other   LBP (low back pain)    Local anesthetic drug adverse reaction 01/21/2022   LOW BACK PAIN 09/29/2007   Chronic, lately (2016) disabling Due to syringomyelia On Oxycodone  Potential benefits of a long term opioids use as well as potential risks (i.e. addiction risk, apnea etc) and complications (i.e. Somnolence, constipation and others) were explained to the patient and were aknowledged.     LUQ PAIN 05/23/2010   Qualifier: Diagnosis of  By: Plotnikov MD, Georgina Quint    Migraine, chronic, without aura, intractable 10/28/2017   Botox approved diagnosis. 7/19 Worse. Start Emagility inj. Relpax   Migraines    Nausea & vomiting 08/19/2011   Nausea without vomiting 06/14/2009   Due to migraines     NECK PAIN 06/14/2009  Chronic, lately (2016) disabling Due to syringomyelia On Oxycodone  Potential benefits of a long term opioids use as well as potential risks (i.e. addiction risk, apnea etc) and complications (i.e. Somnolence, constipation and others) were explained to the patient and were aknowledged.     NONSPECIFIC ABNORMAL RESULTS LIVR FUNCTION STUDY 09/28/2009   Qualifier: Diagnosis of  By: Posey Rea MD, Georgina Quint    OBSESSIVE-COMPULSIVE DISORDER 05/23/2010   Chronic - compulsive shopping    Onychomycosis 01/30/2016   4/17    Orthostatic hypotension 06/30/2013   9/14 likely Rapaflo induced    OTITIS EXTERNA 05/10/2008    Qualifier: Diagnosis of  By: Posey Rea MD, Georgina Quint    Palpitations 06/21/2014   PARESTHESIA 07/13/2008   Qualifier: Diagnosis of  By: Posey Rea MD, Georgina Quint    Partial thickness burn of left wrist 09/11/2015   Postoperative state 07/24/2016   Pyelonephritis 2008   Rash 07/27/2017   Stress 2009   Syncope, near 06/30/2013   9/14 likely Rapaflo induced 12/14 relapsing off Rapaflo x 7 2/15 no relapsing since on 1/2 dose of Topamax    Syringobulbia (HCC)    SYRINGOMYELIA 08/20/2007   Chronic Dr [place] Dr Haskell Riling at [facility] Chronic pain    Syringomyelia (HCC)    Tachycardia    Tonsillitis, chronic 02/26/2017   2018 worse   URINARY TRACT INFECTION (UTI) 03/23/2008   Qualifier: Diagnosis of  By: Posey Rea MD, Georgina Quint    UTI (lower urinary tract infection)    Well adult exam 12/21/2013   We discussed age appropriate health related issues, including available/recomended screening tests and vaccinations. We discussed a need for adhering to healthy diet and exercise. Labs/EKG were reviewed/ordered. All questions were answered.       Past Surgical History:  Procedure Laterality Date   ABDOMINAL HYSTERECTOMY     ANTERIOR AND POSTERIOR REPAIR N/A 07/24/2016   Procedure: Possible ANTERIOR (CYSTOCELE) repair, perineoplasty;  Surgeon: Genia Del, MD;  Location: [facility];  Service: Gynecology;  Laterality: N/A;   BLADDER SUSPENSION N/A 07/24/2016   Procedure: TRANSVAGINAL TAPE (TVT) PROCEDURE;  Surgeon: Genia Del, MD;  Location: [facility];  Service: Gynecology;  Laterality: N/A;   COLONOSCOPY     CYSTOSCOPY N/A 07/24/2016   Procedure: CYSTOSCOPY;  Surgeon: Genia Del, MD;  Location: [facility];  Service: Gynecology;  Laterality: N/A;   FOOT SURGERY Right 2023   FOOT SURGERY Right 2023   Scraped top of foot   intracranial decompression surgery  2006   LEFT HEART CATH AND CORONARY ANGIOGRAPHY N/A 05/07/2018   Procedure: LEFT HEART CATH AND CORONARY ANGIOGRAPHY;  Surgeon: Lyn Records, MD;   Location: [facility];  Service: Cardiovascular;  Laterality: N/A;   PARTIAL KNEE ARTHROPLASTY Left 09/07/2018   Procedure: UNICOMPARTMENTAL LEFT KNEE;  Surgeon: Sheral Apley, MD;  Location: [facility];  Service: Orthopedics;  Laterality: Left;  Adductor Block   ROBOTIC ASSISTED LAPAROSCOPIC SACROCOLPOPEXY N/A 07/24/2016   Procedure: ROBOTIC ASSISTED LAPAROSCOPIC SACROCOLPOPEXY WITH PERINEOPLASTY;  Surgeon: Genia Del, MD;  Location: [facility];  Service: Gynecology;  Laterality: N/A;   TUBAL LIGATION      Family History  Problem Relation Age of Onset   COPD Mother    Peripheral vascular disease Mother    Hypertension Mother    Heart disease Mother    Hypertension Father    Heart attack Maternal Grandmother    Stroke Maternal Grandfather    Stroke Paternal Grandfather    Breast cancer Maternal Aunt  Colon cancer Maternal Uncle 60   Anxiety disorder Other    Asthma Granddaughter     Social History   Occupational History   Occupation: Clinical biochemist: SELF-EMPLOYED   Occupation: disability  Tobacco Use   Smoking status: Never    Passive exposure: Past   Smokeless tobacco: Never  Vaping Use   Vaping status: Never Used  Substance and Sexual Activity   Alcohol use: No   Drug use: No   Sexual activity: Yes    Partners: Male    Birth control/protection: Surgical    Comment: BTL, Hyst; >16; <5     Physical Exam: Blood pressure (!) 130/90, pulse 77, height 5\' 5"  (1.651 m), weight 156 lb (70.8 kg), SpO2 96%.  Gen:      No distress Lungs:    ctab no wheezes or crackles CV:        RRR no mrg   Data Reviewed: Imaging: I have personally reviewed the PET CT from April 2023 - shows thymic cyst, not pet avid. CTPE study June 2023 shows negative PE study.   PFTs:     Latest Ref Rng & Units 05/30/2022    9:58 AM  PFT Results  FVC-Pre L 3.55   FVC-Predicted Pre % 109   FVC-Post L 3.67   FVC-Predicted Post % 112   Pre FEV1/FVC % % 79   Post  FEV1/FCV % % 82   FEV1-Pre L 2.82   FEV1-Predicted Pre % 112   FEV1-Post L 3.02   DLCO uncorrected ml/min/mmHg 23.44   DLCO UNC% % 116   DLCO corrected ml/min/mmHg 23.44   DLCO COR %Predicted % 116   DLVA Predicted % 102   TLC L 5.17   TLC % Predicted % 102   RV % Predicted % 75    I have personally reviewed the patient's PFTs and normal PFT with +BD response  Labs: Lab Results  Component Value Date   NA 140 07/06/2023   K 4.2 07/06/2023   CO2 30 07/06/2023   GLUCOSE 87 07/06/2023   BUN 11 07/06/2023   CREATININE 0.94 07/06/2023   CALCIUM 9.8 07/06/2023   GFR 64.65 07/06/2023   EGFR 85 04/04/2022   GFRNONAA >60 08/31/2018   Lab Results  Component Value Date   WBC 4.6 07/06/2023   HGB 13.0 07/06/2023   HCT 40.5 07/06/2023   MCV 91.4 07/06/2023   PLT 223.0 07/06/2023    Immunization status: Immunization History  Administered Date(s) Administered   Influenza, High Dose Seasonal PF 12/12/2021   Influenza,inj,Quad PF,6+ Mos 09/14/2013, 06/27/2015, 07/25/2016, 07/27/2017, 08/03/2018, 07/29/2019, 07/31/2020, 07/31/2021, 08/19/2022   Influenza-Unspecified 09/13/2014, 07/24/2023   Moderna Sars-Covid-2 Vaccination 01/04/2020, 01/25/2020   PFIZER(Purple Top)SARS-COV-2 Vaccination 08/19/2020, 12/12/2021   Pfizer Covid-19 Vaccine Bivalent Booster 16yrs & up 07/15/2021   Pfizer(Comirnaty)Fall Seasonal Vaccine 12 years and older 07/24/2023   Pneumococcal Polysaccharide-23 07/25/2016   Tdap 09/28/2015   Zoster Recombinant(Shingrix) 09/04/2021, 11/18/2021    External Records Personally Reviewed: cardiology, pcp  Assessment:  Mild intermittent asthma, controlled Passive smoke exposure in childhood Anterior mediastinal mass, thymic cyst Atrial Tachycardia  Plan/Recommendations: Asthma doing well on prn SABA. However given emerging guidelines of moving towards ICS for rescue therapy, will switch to [facility] intermittent use.  Given atrial tachycardia will stop albuterol and  switch to xopanex.  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 And is not likely to be contributing to any respiratory symptoms.  Her PFTs show normal pulmonary function with some bronchial hyperresponsiveness. No evidence of NM weakness.    Return to Care: Return in about 1 year (around 02/02/2025).   Durel Salts, MD Pulmonary and Critical Care Medicine [facility] [phone]

## 2024-02-03 NOTE — Patient Instructions (Signed)
 It was a pleasure to see you today!  Please schedule follow up scheduled with myself in 1 year .  If my schedule is not open yet, we will contact you with a reminder closer to that time. Please call [phone] if you haven't heard from [place] a month before, and always call us sooner if issues or concerns arise. You can also send [place] a message through MyChart, but but aware that this is not to be used for urgent issues and it may take up to 5-7 days to receive a reply. Please be aware that you will likely be able to view your results before I have a chance to respond to them. Please give [place] 5 business days to respond to any non-urgent results.    I am recommending starting an inhaler for your asthma called Breo. Because you do not have daily symptoms, using this inhaler on an as needed basis may be sufficient. Take 1 puff once daily, gargle after use. In addition you will still have a xopanex rescue inhaler available to use up to 4 times a day as needed.  I recommend starting to use the inhaler daily as prescribed if you have symptoms of asthma such as chest tightness, wheezing, coughing, shortness of breath. You should also start using it if you have exposure to a sick contact, worsening allergies, or any other trigger for your asthma. I recommend you keep using it even after your respiratory symptoms resolve for 3-4 days. The goal of this therapy is to prevent your symptoms from becoming a flare severe enough to require steroids like prednisone.   Please call our office if using this inhaler on an as needed basis is not sufficient. You might need to be seen sooner than our scheduled follow up.

## 2024-02-04 ENCOUNTER — Other Ambulatory Visit (HOSPITAL_COMMUNITY): Payer: Self-pay

## 2024-02-04 MED ORDER — ONABOTULINUMTOXINA 200 UNITS IJ SOLR: INTRAMUSCULAR | 4 refills | Status: AC

## 2024-02-04 NOTE — Addendum Note (Signed)
 Addended by: Leida Lauth on: 02/04/2024 01:21 PM   Modules accepted: Orders

## 2024-02-04 NOTE — Telephone Encounter (Signed)
 Pharmacy Patient Advocate Encounter   Botox[employer] verification has been Completed Benefit Verification #:  [id number]   Dx Code: G43.709 J-code: [id number] Procedure code: 60454

## 2024-02-04 NOTE — Telephone Encounter (Signed)
 Patient advised.

## 2024-02-05 ENCOUNTER — Ambulatory Visit: Admitting: Neurology

## 2024-02-08 ENCOUNTER — Other Ambulatory Visit (HOSPITAL_COMMUNITY): Payer: Self-pay

## 2024-02-08 NOTE — Telephone Encounter (Signed)
 Please see chart note for 4.3.25 regarding approval for Aimovig, and chart note from 4.7.25 for Botox approval. The letter for Aimovig is a transition fill letter, and the Botox is denial through pharmacy benefits.

## 2024-02-09 ENCOUNTER — Other Ambulatory Visit: Payer: Self-pay | Admitting: Internal Medicine

## 2024-02-12 NOTE — Progress Notes (Addendum)
  Electrophysiology Office Note:   Date:  02/15/2024  ID:  ANTOINE FIALLOS, DOB 1960/12/17, MRN [id number]  Primary Cardiologist: None Electrophysiologist: Richardo Chandler, MD      History of Present Illness:   Beth Huffman is a 63 y.o. female with h/o AT and HTN seen today for routine electrophysiology followup.   Since last being seen in our clinic the patient reports doing about the same. Has mild chest pressure that quickly resolves with rest. No syncope. BP has been OK. Prone to HAs, and has not had to take any NTG. Her desire to avoid HAs has been stronger than any chest discomfort she has had. Pending EGD/Colonoscopy.  Started on Linzess  which has given mild improvement of her constipation.   Review of systems complete and found to be negative unless listed in HPI.   EP Information / Studies Reviewed:    EKG is not ordered today. EKG from 12/21/2023 reviewed which showed NSR at 75 bpm       Arrhythmia/Device History No specialty comments available.   Physical Exam:   VS:  BP (!) 134/92   Pulse 81   Ht 5\' 5"  (1.651 m)   Wt 151 lb (68.5 kg)   SpO2 97%   BMI 25.13 kg/m    Wt Readings from Last 3 Encounters:  02/15/24 151 lb (68.5 kg)  02/03/24 156 lb (70.8 kg)  12/30/23 157 lb (71.2 kg)     GEN: No acute distress NECK: No JVD; No carotid bruits CARDIAC: Regular rate and rhythm, no murmurs, rubs, gallops RESPIRATORY:  Clear to auscultation without rales, wheezing or rhonchi  ABDOMEN: Soft, non-tender, non-distended EXTREMITIES:  No edema; No deformity   ASSESSMENT AND PLAN:    Atrial tachycardia Palpitations Lightheadedness episodes did not correlate on monitor.  Again discussed possibility of loop recorder to help correlate her symptoms Continue verapamil  120 mg daily  HTN Stable on current regimen  Verapamil  stopped with constipation, which exacerbated her syringomyelia.  Dyspnea Chest pain Calcium score 0 in 2023. Myoview  WNL 03/2022 Has not required  NTG. Her desire to avoid HA has outweighed any symptoms she has had.  We discussed the possibility of trying imdur  were she to have more CP.  Dr. Rodolfo Clan has also previously raised the question of ACE+statin combination   Cardiac Clearance for Colon/EGD No concerns from a cardiac perspective given benign prior work up and stable symptoms.  The patient is at low risk to proceed.  If the patient has new chest pain or SOB prior to surgery, they should be revaluated.   Follow up with EP APP in 6 months  Signed, Tylene Galla, PA-C

## 2024-02-15 ENCOUNTER — Ambulatory Visit: Payer: HMO | Attending: Student | Admitting: Student

## 2024-02-15 ENCOUNTER — Encounter: Payer: Self-pay | Admitting: Student

## 2024-02-15 VITALS — BP 134/92 | HR 81 | Ht 65.0 in | Wt 151.0 lb

## 2024-02-15 DIAGNOSIS — I4719 Other supraventricular tachycardia: Secondary | ICD-10-CM

## 2024-02-15 DIAGNOSIS — I493 Ventricular premature depolarization: Secondary | ICD-10-CM | POA: Diagnosis not present

## 2024-02-15 DIAGNOSIS — R0609 Other forms of dyspnea: Secondary | ICD-10-CM

## 2024-02-15 DIAGNOSIS — R0789 Other chest pain: Secondary | ICD-10-CM | POA: Diagnosis not present

## 2024-02-15 NOTE — Patient Instructions (Signed)
 Medication Instructions:  Your physician recommends that you continue on your current medications as directed. Please refer to the Current Medication list given to you today.  *If you need a refill on your cardiac medications before your next appointment, please call your pharmacy*  Lab Work: None ordered If you have labs (blood work) drawn today and your tests are completely normal, you will receive your results only by: MyChart Message (if you have MyChart) OR A paper copy in the mail If you have any lab test that is abnormal or we need to change your treatment, we will call you to review the results.  Follow-Up: At [facility], you and your health needs are our priority.  As part of our continuing mission to provide you with exceptional heart care, our providers are all part of one team.  This team includes your primary Cardiologist (physician) and Advanced Practice Providers or APPs (Physician Assistants and Nurse Practitioners) who all work together to provide you with the care you need, when you need it.  Your next appointment:   6 month(s)  Provider:   Casimiro Needle "Mardelle Matte" Lanna Poche, PA-C      1st Floor: - Lobby - Registration  - Pharmacy  - Lab - Cafe  2nd Floor: - PV Lab - Diagnostic Testing (echo, CT, nuclear med)  3rd Floor: - Vacant  4th Floor: - TCTS (cardiothoracic surgery) - AFib Clinic - Structural Heart Clinic - Vascular Surgery  - Vascular Ultrasound  5th Floor: - HeartCare Cardiology (general and EP) - Clinical Pharmacy for coumadin, hypertension, lipid, weight-loss medications, and med management appointments    Valet parking services will be available as well.

## 2024-02-17 ENCOUNTER — Encounter: Payer: Self-pay | Admitting: Neurology

## 2024-02-18 DIAGNOSIS — I493 Ventricular premature depolarization: Secondary | ICD-10-CM

## 2024-02-18 DIAGNOSIS — I4719 Other supraventricular tachycardia: Secondary | ICD-10-CM

## 2024-02-19 ENCOUNTER — Ambulatory Visit: Admitting: Neurology

## 2024-02-22 ENCOUNTER — Encounter: Payer: Self-pay | Admitting: Internal Medicine

## 2024-02-23 ENCOUNTER — Other Ambulatory Visit: Payer: Self-pay | Admitting: Internal Medicine

## 2024-02-23 DIAGNOSIS — M25551 Pain in right hip: Secondary | ICD-10-CM

## 2024-02-23 MED ORDER — OXYCODONE HCL 10 MG PO TABS: 10.0000 mg | ORAL_TABLET | Freq: Four times a day (QID) | ORAL | 0 refills | Status: DC | PRN

## 2024-02-29 ENCOUNTER — Encounter: Payer: Self-pay | Admitting: Internal Medicine

## 2024-02-29 ENCOUNTER — Ambulatory Visit: Admitting: Internal Medicine

## 2024-02-29 VITALS — BP 124/76 | HR 84 | Temp 98.0°F | Ht 65.0 in | Wt 152.0 lb

## 2024-02-29 DIAGNOSIS — G8929 Other chronic pain: Secondary | ICD-10-CM

## 2024-02-29 DIAGNOSIS — M7062 Trochanteric bursitis, left hip: Secondary | ICD-10-CM | POA: Insufficient documentation

## 2024-02-29 DIAGNOSIS — G43709 Chronic migraine without aura, not intractable, without status migrainosus: Secondary | ICD-10-CM | POA: Diagnosis not present

## 2024-02-29 DIAGNOSIS — M5442 Lumbago with sciatica, left side: Secondary | ICD-10-CM

## 2024-02-29 DIAGNOSIS — M5441 Lumbago with sciatica, right side: Secondary | ICD-10-CM

## 2024-02-29 DIAGNOSIS — G950 Syringomyelia and syringobulbia: Secondary | ICD-10-CM

## 2024-02-29 MED ORDER — METHYLPREDNISOLONE ACETATE 80 MG/ML IJ SUSP
80.0000 mg | Freq: Once | INTRAMUSCULAR | Status: AC
  Administered 2024-02-29: 80 mg via INTRAMUSCULAR

## 2024-02-29 MED ORDER — OXYCODONE HCL 10 MG PO TABS
10.0000 mg | ORAL_TABLET | Freq: Four times a day (QID) | ORAL | 0 refills | Status: DC | PRN
Start: 2024-02-29 — End: 2024-05-27

## 2024-02-29 MED ORDER — OXYCODONE HCL 10 MG PO TABS: 10.0000 mg | ORAL_TABLET | Freq: Four times a day (QID) | ORAL | 0 refills | Status: DC | PRN

## 2024-02-29 MED ORDER — OXYCODONE HCL 10 MG PO TABS
10.0000 mg | ORAL_TABLET | Freq: Four times a day (QID) | ORAL | 0 refills | Status: DC | PRN
Start: 2024-02-29 — End: 2024-04-05

## 2024-02-29 NOTE — Progress Notes (Unsigned)
 Subjective:  Patient ID: Beth Huffman, female    DOB: [birth year]  Age: 63 y.o. MRN: [id number]  CC: Hip Pain (Patient notes new acute left hip pain for several weeks. Patient currently taking oxycodone  10 mg for post brain surgery, as well as blue emu cream and heating pad. Patient does have past history of arthritis, knee replacement (left))   HPI Beth Huffman presents for L Hip Pain (Patient notes new acute left hip pain for several weeks. Patient currently taking oxycodone  10 mg for post brain surgery, as well as blue emu cream and heating pad. Patient does have past history of arthritis, knee replacement (left).  She relates the onset of the pain to her gardening. Follow-up on chronic pain syndrome.  Going on vacation on 5/10     Outpatient Medications Prior to Visit  Medication Sig Dispense Refill   albuterol  (VENTOLIN  HFA) 108 (90 Base) MCG/ACT inhaler Inhale 2 puffs into the lungs every 6 (six) hours as needed for wheezing or shortness of breath. 18 g 2   botulinum toxin Type A  (BOTOX ) 200 units injection Inject 155 units IM into multiple site in the face,neck and head once every 90 days 1 each 4   cholecalciferol (VITAMIN D ) 1000 UNITS tablet Take 1,000 Units by mouth daily.     cyclobenzaprine  (FLEXERIL ) 5 MG tablet TAKE 1 TABLET THREE TIMES DAILY AS NEEDED FOR MUSCLE SPASMS. 90 tablet 3   Erenumab -aooe (AIMOVIG ) 140 MG/ML SOAJ Inject 140 mg into the skin every 28 (twenty-eight) days. 1.12 mL 5   estradiol  (ESTRACE ) 0.5 MG tablet Take 1 tablet (0.5 mg total) by mouth daily. 90 tablet 4   fluticasone  furoate-vilanterol (BREO ELLIPTA ) 100-25 MCG/ACT AEPB Inhale 1 puff into the lungs daily. 1 each 5   levalbuterol  (XOPENEX  HFA) 45 MCG/ACT inhaler Inhale 2 puffs into the lungs every 6 (six) hours as needed for wheezing or shortness of breath. 1 each 12   linaclotide  (LINZESS ) 72 MCG capsule Take 1 capsule (72 mcg total) by mouth daily before breakfast. Take 30 minutes before a  meal 30 capsule 5   Multiple Vitamin (MULTIVITAMIN PO) Take 1 tablet by mouth daily.     naproxen  (NAPROSYN ) 500 MG tablet TAKE ONE TABLET BY MOUTH TWICE DAILY WITH A MEAL 60 tablet 3   nitroGLYCERIN  (NITROSTAT ) 0.4 MG SL tablet Place 1 tablet (0.4 mg total) under the tongue every 5 (five) minutes as needed for chest pain. 25 tablet 3   ondansetron  (ZOFRAN ) 8 MG tablet TAKE ONE TABLET BY MOUTH EVERY 8 HOURS AS NEEDED FOR NAUSEA OR FOR VOMITING 21 tablet 3   oxymetazoline  (AFRIN NASAL SPRAY) 0.05 % nasal spray Place 1 spray into both nostrils 2 (two) times daily. 30 mL 0   pantoprazole  (PROTONIX ) 40 MG tablet Take 1 tablet (40 mg total) by mouth 2 (two) times daily before a meal. 60 tablet 2   polyethylene glycol (MIRALAX  / GLYCOLAX ) packet Take 8.5 g by mouth every other day.     rizatriptan  (MAXALT ) 10 MG tablet TAKE 1 TABLET AT ONSET OF MIGRAINE- MAY REPEAT ONCE IN 2 HOURS. LIMIT 2/24 HOURS. AVOID DAILY USE. 8 tablet 5   senna (SENOKOT) 8.6 MG tablet Take 1 tablet by mouth as needed for constipation.     verapamil  (CALAN -SR) 120 MG CR tablet Take 1 tablet (120 mg total) by mouth at bedtime. 90 tablet 3   Oxycodone  HCl 10 MG TABS Take 1 tablet (10 mg total) by mouth every 6 (  six) hours as needed. 120 tablet 0   Oxycodone  HCl 10 MG TABS Take 1 tablet (10 mg total) by mouth every 6 (six) hours as needed. 120 tablet 0   Oxycodone  HCl 10 MG TABS Take 1 tablet (10 mg total) by mouth every 6 (six) hours as needed. 120 tablet 0   phentermine  (ADIPEX-P ) 37.5 MG tablet Take 1 tablet (37.5 mg total) by mouth daily before breakfast. 30 tablet 3   No facility-administered medications prior to visit.    ROS: Review of Systems  Constitutional:  Negative for activity change, appetite change, chills, fatigue and unexpected weight change.  HENT:  Negative for congestion, mouth sores and sinus pressure.   Eyes:  Negative for visual disturbance.  Respiratory:  Negative for cough and chest tightness.    Gastrointestinal:  Negative for abdominal pain and nausea.  Genitourinary:  Negative for difficulty urinating, frequency and vaginal pain.  Musculoskeletal:  Positive for arthralgias, back pain and gait problem.  Skin:  Negative for pallor and rash.  Neurological:  Negative for dizziness, tremors, weakness, numbness and headaches.  Psychiatric/Behavioral:  Negative for confusion and sleep disturbance.     Objective:  BP 124/76   Pulse 84   Temp 98 F (36.7 C)   Ht 5\' 5"  (1.651 m)   Wt 152 lb (68.9 kg)   SpO2 99%   BMI 25.29 kg/m   BP Readings from Last 3 Encounters:  02/29/24 124/76  02/15/24 (!) 134/92  02/03/24 (!) 130/90    Wt Readings from Last 3 Encounters:  02/29/24 152 lb (68.9 kg)  02/15/24 151 lb (68.5 kg)  02/03/24 156 lb (70.8 kg)    Physical Exam Constitutional:      General: She is not in acute distress.    Appearance: She is well-developed.  HENT:     Head: Normocephalic.     Right Ear: External ear normal.     Left Ear: External ear normal.     Nose: Nose normal.  Eyes:     General:        Right eye: No discharge.        Left eye: No discharge.     Conjunctiva/sclera: Conjunctivae normal.     Pupils: Pupils are equal, round, and reactive to light.  Neck:     Thyroid : No thyromegaly.     Vascular: No JVD.     Trachea: No tracheal deviation.  Cardiovascular:     Rate and Rhythm: Normal rate and regular rhythm.     Heart sounds: Normal heart sounds.  Pulmonary:     Effort: No respiratory distress.     Breath sounds: No stridor. No wheezing.  Abdominal:     General: Bowel sounds are normal. There is no distension.     Palpations: Abdomen is soft. There is no mass.     Tenderness: There is no abdominal tenderness. There is no guarding or rebound.  Musculoskeletal:        General: Tenderness present.     Cervical back: Normal range of motion and neck supple. No rigidity.  Lymphadenopathy:     Cervical: No cervical adenopathy.  Skin:     Findings: No erythema or rash.  Neurological:     Cranial Nerves: No cranial nerve deficit.     Motor: No abnormal muscle tone.     Coordination: Coordination normal.     Deep Tendon Reflexes: Reflexes normal.  Psychiatric:        Behavior: Behavior normal.  Thought Content: Thought content normal.        Judgment: Judgment normal.   Antalgic gait, left lateral hip is painful to palpation.  Otherwise good range of motion both hips  L hip w/pain - lateral aspect   Procedure Note :     Procedure : Joint Injection,   L hip   Indication:  Trochanteric bursitis with refractory  chronic pain.   Risks including unsuccessful procedure , bleeding, infection, bruising, skin atrophy, "steroid flare-up" and others were explained to the patient in detail as well as the benefits. Informed consent was obtained verbally.  Tthe patient was placed in a comfortable lateral decubitus position. The point of maximal tenderness was identified. Skin was prepped with Betadine  and alcohol. Then, a 5 cc syringe with a 2 inch long 24-gauge needle was used for a bursa injection.. The needle was advanced  Into the bursa. I injected the bursa with 4 mL of 2% lidocaine  and 80 mg of Depo-Medrol  .  Band-Aid was applied.   Tolerated well. Complications: None. Good pain relief following the procedure.   Postprocedure instructions :    A Band-Aid should be left on for 12 hours. Injection therapy is not a cure itself. It is used in conjunction with other modalities. You can use nonsteroidal anti-inflammatories like ibuprofen  , hot and cold compresses. Rest is recommended in the next 24 hours. You need to report immediately  if fever, chills or any signs of infection develop.    Lab Results  Component Value Date   WBC 4.6 07/06/2023   HGB 13.0 07/06/2023   HCT 40.5 07/06/2023   PLT 223.0 07/06/2023   GLUCOSE 87 07/06/2023   CHOL 203 (H) 07/06/2023   TRIG 79.0 07/06/2023   HDL 63.30 07/06/2023   LDLDIRECT  130.2 12/21/2013   LDLCALC 124 (H) 07/06/2023   ALT 16 07/06/2023   AST 23 07/06/2023   NA 140 07/06/2023   K 4.2 07/06/2023   CL 104 07/06/2023   CREATININE 0.94 07/06/2023   BUN 11 07/06/2023   CO2 30 07/06/2023   TSH 1.72 07/06/2023   HGBA1C 5.5 03/31/2018    MM 3D DIAGNOSTIC MAMMOGRAM BILATERAL BREAST Result Date: 03/31/2023 CLINICAL DATA:  63 year old female presenting for evaluation of a new lump in the inferior right breast and for annual exam of the left breast. EXAM: DIGITAL DIAGNOSTIC BILATERAL MAMMOGRAM WITH TOMOSYNTHESIS; ULTRASOUND RIGHT BREAST LIMITED TECHNIQUE: Bilateral digital diagnostic mammography and breast tomosynthesis was performed.; Targeted ultrasound examination of the right breast was performed COMPARISON:  Previous exam(s). ACR Breast Density Category c: The breasts are heterogeneously dense, which may obscure small masses. FINDINGS: Mammogram: Right breast: A skin BB marks the palpable site of concern reported by the patient in the central inferior right breast. Spot tangential view of this area was performed in addition to standard views. There is no new abnormality at the palpable site or elsewhere in the right breast is suggested presence of malignancy. Left breast: No suspicious mass, distortion, or microcalcifications are identified to suggest presence of malignancy. On physical exam at the site of concern reported by the patient in the central inferior right breast I do not feel a discrete mass or focal area of thickening. Ultrasound: Targeted ultrasound performed at the palpable site of concern in the right breast at 7 o'clock 6 cm from the nipple demonstrating no cystic or solid mass. IMPRESSION: 1. No mammographic or sonographic evidence of malignancy at the palpable site of concern in the right breast. 2. No  mammographic evidence of malignancy in the left breast. RECOMMENDATION: 1. Recommend any further workup of the palpable site in the right breast be on a  clinical basis given negative imaging findings. 2.  Screening mammogram in one year.(Code:SM-B-01Y) I have discussed the findings and recommendations with the patient. If applicable, a reminder letter will be sent to the patient regarding the next appointment. BI-RADS CATEGORY  1: Negative. Electronically Signed   By: Allena Ito M.D.   On: 03/31/2023 09:26   US  LIMITED ULTRASOUND INCLUDING AXILLA RIGHT BREAST Result Date: 03/31/2023 CLINICAL DATA:  63 year old female presenting for evaluation of a new lump in the inferior right breast and for annual exam of the left breast. EXAM: DIGITAL DIAGNOSTIC BILATERAL MAMMOGRAM WITH TOMOSYNTHESIS; ULTRASOUND RIGHT BREAST LIMITED TECHNIQUE: Bilateral digital diagnostic mammography and breast tomosynthesis was performed.; Targeted ultrasound examination of the right breast was performed COMPARISON:  Previous exam(s). ACR Breast Density Category c: The breasts are heterogeneously dense, which may obscure small masses. FINDINGS: Mammogram: Right breast: A skin BB marks the palpable site of concern reported by the patient in the central inferior right breast. Spot tangential view of this area was performed in addition to standard views. There is no new abnormality at the palpable site or elsewhere in the right breast is suggested presence of malignancy. Left breast: No suspicious mass, distortion, or microcalcifications are identified to suggest presence of malignancy. On physical exam at the site of concern reported by the patient in the central inferior right breast I do not feel a discrete mass or focal area of thickening. Ultrasound: Targeted ultrasound performed at the palpable site of concern in the right breast at 7 o'clock 6 cm from the nipple demonstrating no cystic or solid mass. IMPRESSION: 1. No mammographic or sonographic evidence of malignancy at the palpable site of concern in the right breast. 2. No mammographic evidence of malignancy in the left breast.  RECOMMENDATION: 1. Recommend any further workup of the palpable site in the right breast be on a clinical basis given negative imaging findings. 2.  Screening mammogram in one year.(Code:SM-B-01Y) I have discussed the findings and recommendations with the patient. If applicable, a reminder letter will be sent to the patient regarding the next appointment. BI-RADS CATEGORY  1: Negative. Electronically Signed   By: Allena Ito M.D.   On: 03/31/2023 09:26   Assessment & Plan:   Problem List Items Addressed This Visit     SYRINGOMYELIA   Seems to be stable.  No new symptoms      Migraine headache   Migraines 10-15/month Maxalt , Ibuprofen  prn, Zofran  prn       Relevant Medications   Oxycodone  HCl 10 MG TABS   Oxycodone  HCl 10 MG TABS   Oxycodone  HCl 10 MG TABS   LOW BACK PAIN    Due to syringomyelia On Oxycodone   Potential benefits of a long term opioids use as well as potential risks (i.e. addiction risk, apnea etc) and complications (i.e. Somnolence, constipation and others) were explained to the patient and were aknowledged.      Relevant Medications   Oxycodone  HCl 10 MG TABS   Oxycodone  HCl 10 MG TABS   Oxycodone  HCl 10 MG TABS   Trochanteric bursitis of left hip - Primary   New.  Severe pain. Hip opener exercises.  Heat/ice/massage The patient requested to inject.  See the procedure         Meds ordered this encounter  Medications   Oxycodone  HCl 10 MG TABS  Sig: Take 1 tablet (10 mg total) by mouth every 6 (six) hours as needed.    Dispense:  120 tablet    Refill:  0    Please fill on or after 04/05/2024   Oxycodone  HCl 10 MG TABS    Sig: Take 1 tablet (10 mg total) by mouth every 6 (six) hours as needed.    Dispense:  120 tablet    Refill:  0    Please fill on or after 05/05/2024   Oxycodone  HCl 10 MG TABS    Sig: Take 1 tablet (10 mg total) by mouth every 6 (six) hours as needed.    Dispense:  120 tablet    Refill:  0    Please fill on 03/05/24 (not  or after 03/08/2024) due to travel   methylPREDNISolone  acetate (DEPO-MEDROL ) injection 80 mg      Follow-up: Return in about 3 months (around 05/31/2024) for a follow-up visit.  Anitra Barn, MD

## 2024-02-29 NOTE — Patient Instructions (Signed)
Hip opener exercises 

## 2024-03-01 ENCOUNTER — Telehealth: Payer: Self-pay

## 2024-03-01 NOTE — Assessment & Plan Note (Signed)
Seems to be stable.  No new symptoms

## 2024-03-01 NOTE — Telephone Encounter (Signed)
 Spoke to therese at [facility] 02/29/24, They do not have any information form the office since 2024.  Advised rep we spoke to the office last month twice. Me and heather have called and spoken to different reps in the middle of April and on 02/25/24 fax over the approval as well to the fax numbers given.   Per rep she will send a message Urgent to have the insurance verification done.   Sent patient a Clinical cytogeneticist message as well called with the info given.  Had front desk cancel appt on 03/04/24.   Patient left a voicemail to call her back today.    Returned patient called, Patient upset she is unable to be seen on 03/04/24. Patient feels like she is unable to be seen due her appt given away.   Advised patient appt was cancelled due to no Botox  in office to give her and it may not come in time for the 03/04/24 visit.   Patient states she is the patient and she shouldn't be dealing with this the office should.   Tried to explain to patient the situation we do not want to see her we need the Botox  here to see her. And we have called on numerous times to get the medication here on time.

## 2024-03-01 NOTE — Assessment & Plan Note (Signed)
 New.  Severe pain. Hip opener exercises.  Heat/ice/massage The patient requested to inject.  See the procedure

## 2024-03-01 NOTE — Assessment & Plan Note (Signed)
 Migraines 10-15/month Maxalt , Ibuprofen  prn, Zofran  prn

## 2024-03-01 NOTE — Assessment & Plan Note (Signed)
Due to syringomyelia On Oxycodone  Potential benefits of a long term opioids use as well as potential risks (i.e. addiction risk, apnea etc) and complications (i.e. Somnolence, constipation and others) were explained to the patient and were aknowledged. 

## 2024-03-02 ENCOUNTER — Telehealth: Payer: Self-pay | Admitting: Pharmacy Technician

## 2024-03-02 ENCOUNTER — Other Ambulatory Visit (HOSPITAL_COMMUNITY): Payer: Self-pay

## 2024-03-02 NOTE — Telephone Encounter (Signed)
 Tried calling patient no answer. Sent mychart message as well LMOVM.

## 2024-03-02 NOTE — Telephone Encounter (Signed)
 Called insurance to verify benefits. Botox  is not covered under patients pharmacy benefits. It is covered under medical (buy and bill). However, on this patients plan, Dr Festus Hubert is an out of network provider. Pt has an HMO plan with no out of network benefits. Provider can request a one time exception by calling the care management team at [phone]. Call reference # [id number]  If in network: No PA needed for CPT code 29528 No calendar deductible $3050 out of pocket maximum ($43 paid towards that) No co-insurance $20 copay

## 2024-03-02 NOTE — Telephone Encounter (Signed)
 ERROR

## 2024-03-02 NOTE — Telephone Encounter (Signed)
 Submit verification form to submit received from covermymeds.

## 2024-03-04 ENCOUNTER — Ambulatory Visit: Admitting: Neurology

## 2024-03-16 ENCOUNTER — Telehealth: Payer: Self-pay | Admitting: Neurology

## 2024-03-16 ENCOUNTER — Ambulatory Visit: Admitting: Internal Medicine

## 2024-03-16 ENCOUNTER — Encounter: Payer: Self-pay | Admitting: Internal Medicine

## 2024-03-16 NOTE — Telephone Encounter (Signed)
 Called and LM with AN. Needs two PA for two medications. Wants to know what is going to be done about this.

## 2024-03-16 NOTE — Telephone Encounter (Signed)
 Per Patient Beth Huffman is IN NETWORK the address they have is wrong for Dr.Jaffe.    So they are looking into Nurtec,Aimovig ,Botox 

## 2024-03-16 NOTE — Telephone Encounter (Signed)
See PA encounter

## 2024-03-17 ENCOUNTER — Other Ambulatory Visit (HOSPITAL_COMMUNITY): Payer: Self-pay

## 2024-03-17 ENCOUNTER — Telehealth: Payer: Self-pay

## 2024-03-17 NOTE — Telephone Encounter (Signed)
 [employer] sent over a fax form for Aimovig .  Forms faxed to [place].

## 2024-03-17 NOTE — Telephone Encounter (Signed)
 Form has been received for nurtec and aimovig . Will complete and fax back. Encounter will be created.

## 2024-03-17 NOTE — Telephone Encounter (Signed)
 Fax received from [facility] for PA nurtec.

## 2024-03-21 ENCOUNTER — Other Ambulatory Visit: Payer: Self-pay | Admitting: Internal Medicine

## 2024-03-21 DIAGNOSIS — M7062 Trochanteric bursitis, left hip: Secondary | ICD-10-CM

## 2024-03-22 NOTE — Telephone Encounter (Addendum)
 Letter received from [employer] Aimovig  denied. No clinical information received.    Same letter received for Nurtec

## 2024-03-23 ENCOUNTER — Telehealth: Payer: Self-pay | Admitting: Pharmacy Technician

## 2024-03-23 NOTE — Telephone Encounter (Signed)
 Pharmacy Patient Advocate Encounter   Received notification from Pt Calls Messages that prior authorization for NURTEC 75MG  is required/requested.   Insurance verification completed.   The patient is insured through [facility] .   Per test claim: PA required; PA submitted to above mentioned insurance via Fax Key/confirmation #/EOC [id number] Status is pending

## 2024-03-23 NOTE — Telephone Encounter (Signed)
 Pharmacy Patient Advocate Encounter   Received notification from Pt Calls Messages that prior authorization for AIMOVIG  140MG  is required/requested.   Insurance verification completed.   The patient is insured through [facility] .   Per test claim: PA required; PA submitted to above mentioned insurance via Fax Key/confirmation #/EOC [id number] Status is pending

## 2024-03-28 ENCOUNTER — Ambulatory Visit (INDEPENDENT_AMBULATORY_CARE_PROVIDER_SITE_OTHER)

## 2024-03-28 VITALS — Ht 65.0 in | Wt 152.0 lb

## 2024-03-28 DIAGNOSIS — Z0000 Encounter for general adult medical examination without abnormal findings: Secondary | ICD-10-CM

## 2024-03-28 DIAGNOSIS — H5203 Hypermetropia, bilateral: Secondary | ICD-10-CM | POA: Diagnosis not present

## 2024-03-28 NOTE — Patient Instructions (Addendum)
 Beth Huffman , Thank you for taking time out of your busy schedule to complete your Annual Wellness Visit with me. I enjoyed our conversation and look forward to speaking with you again next year. I, as well as your care team,  appreciate your ongoing commitment to your health goals. Please review the following plan we discussed and let me know if I can assist you in the future. Your Game plan/ To Do List   Follow up Visits: Next Medicare AWV with our clinical staff: 03/29/2025.   Have you seen your provider in the last 6 months (3 months if uncontrolled diabetes)? Yes Next Office Visit with your provider: 04/05/2024.  Clinician Recommendations:  Aim for 30 minutes of exercise or brisk walking, 6-8 glasses of water , and 5 servings of fruits and vegetables each day. You are due for a HIV screening, a Hep C screening and a pneumonia vaccine, which you can get that during your next eye exam.      This is a list of the screening recommended for you and due dates:  Health Maintenance  Topic Date Due   HIV Screening  Never done   Hepatitis C Screening  Never done   Pneumococcal Vaccination (2 of 2 - PCV) 07/25/2017   Medicare Annual Wellness Visit  06/21/2023   COVID-19 Vaccine (7 - Mixed Product risk 2024-25 season) 01/21/2024   Flu Shot  05/27/2024   Mammogram  03/30/2025   DTaP/Tdap/Td vaccine (2 - Td or Tdap) 09/27/2025   Cologuard (Stool DNA test)  08/20/2026   Zoster (Shingles) Vaccine  Completed   HPV Vaccine  Aged Out   Meningitis B Vaccine  Aged Out   Colon Cancer Screening  Discontinued    Advanced directives: (Copy Requested) Please bring a copy of your health care power of attorney and living will to the office to be added to your chart at your convenience. You can mail to [facility] [address] [place], [place] [address] or email to [email or web address] Advance Care Planning is important because it:  [x]  Makes sure you receive the medical care that is  consistent with your values, goals, and preferences  [x]  It provides guidance to your family and loved ones and reduces their decisional burden about whether or not they are making the right decisions based on your wishes.  Follow the link provided in your after visit summary or read over the paperwork we have mailed to you to help you started getting your Advance Directives in place. If you need assistance in completing these, please reach out to us  so that we can help you!  See attachments for Preventive Care and Fall Prevention Tips.  Managing Pain Without Opioids Opioids are strong medicines used to treat moderate to severe pain. For some people, especially those who have long-term (chronic) pain, opioids may not be the best choice for pain management due to: Side effects like nausea, constipation, and sleepiness. The risk of addiction (opioid use disorder). The longer you take opioids, the greater your risk of addiction. Pain that lasts for more than 3 months is called chronic pain. Managing chronic pain usually requires more than one approach and is often provided by a team of health care providers working together (multidisciplinary approach). Pain management may be done at a pain management center or pain clinic. How to manage pain without the use of opioids Use non-opioid medicines Non-opioid medicines for pain may include: Over-the-counter or prescription non-steroidal anti-inflammatory drugs (NSAIDs). These may be  the first medicines used for pain. They work well for muscle and bone pain, and they reduce swelling. Acetaminophen . This over-the-counter medicine may work well for milder pain but not swelling. Antidepressants. These may be used to treat chronic pain. A certain type of antidepressant (tricyclics) is often used. These medicines are given in lower doses for pain than when used for depression. Anticonvulsants. These are usually used to treat seizures but may also reduce nerve  (neuropathic) pain. Muscle relaxants. These relieve pain caused by sudden muscle tightening (spasms). You may also use a pain medicine that is applied to the skin as a patch, cream, or gel (topical analgesic), such as a numbing medicine. These may cause fewer side effects than medicines taken by mouth. Do certain therapies as directed Some therapies can help with pain management. They include: Physical therapy. You will do exercises to gain strength and flexibility. A physical therapist may teach you exercises to move and stretch parts of your body that are weak, stiff, or painful. You can learn these exercises at physical therapy visits and practice them at home. Physical therapy may also involve: Massage. Heat wraps or applying heat or cold to affected areas. Electrical signals that interrupt pain signals (transcutaneous electrical nerve stimulation, TENS). Weak lasers that reduce pain and swelling (low-level laser therapy). Signals from your body that help you learn to regulate pain (biofeedback). Occupational therapy. This helps you to learn ways to function at home and work with less pain. Recreational therapy. This involves trying new activities or hobbies, such as a physical activity or drawing. Mental health therapy, including: Cognitive behavioral therapy (CBT). This helps you learn coping skills for dealing with pain. Acceptance and commitment therapy (ACT) to change the way you think and react to pain. Relaxation therapies, including muscle relaxation exercises and mindfulness-based stress reduction. Pain management counseling. This may be individual, family, or group counseling.  Receive medical treatments Medical treatments for pain management include: Nerve block injections. These may include a pain blocker and anti-inflammatory medicines. You may have injections: Near the spine to relieve chronic back or neck pain. Into joints to relieve back or joint pain. Into nerve areas  that supply a painful area to relieve body pain. Into muscles (trigger point injections) to relieve some painful muscle conditions. A medical device placed near your spine to help block pain signals and relieve nerve pain or chronic back pain (spinal cord stimulation device). Acupuncture. Follow these instructions at home Medicines Take over-the-counter and prescription medicines only as told by your health care provider. If you are taking pain medicine, ask your health care providers about possible side effects to watch out for. Do not drive or use heavy machinery while taking prescription opioid pain medicine. Lifestyle  Do not use drugs or alcohol to reduce pain. If you drink alcohol, limit how much you have to: 0-1 drink a day for women who are not pregnant. 0-2 drinks a day for men. Know how much alcohol is in a drink. In the [place] one drink equals one 12 oz bottle of beer (355 mL), one 5 oz glass of wine (148 mL), or one 1 oz glass of hard liquor (44 mL). Do not use any products that contain nicotine or tobacco. These products include cigarettes, chewing tobacco, and vaping devices, such as e-cigarettes. If you need help quitting, ask your health care provider. Eat a healthy diet and maintain a healthy weight. Poor diet and excess weight may make pain worse. Eat foods that are high in  fiber. These include fresh fruits and vegetables, whole grains, and beans. Limit foods that are high in fat and processed sugars, such as fried and sweet foods. Exercise regularly. Exercise lowers stress and may help relieve pain. Ask your health care provider what activities and exercises are safe for you. If your health care provider approves, join an exercise class that combines movement and stress reduction. Examples include yoga and tai chi. Get enough sleep. Lack of sleep may make pain worse. Lower stress as much as possible. Practice stress reduction techniques as told by your therapist. General  instructions Work with all your pain management providers to find the treatments that work best for you. You are an important member of your pain management team. There are many things you can do to reduce pain on your own. Consider joining an online or in-person support group for people who have chronic pain. Keep all follow-up visits. This is important. Where to find more information You can find more information about managing pain without opioids from: American Academy of Pain Medicine: painmed.org Institute for Chronic Pain: instituteforchronicpain.org American Chronic Pain Association: theacpa.org Contact a health care provider if: You have side effects from pain medicine. Your pain gets worse or does not get better with treatments or home therapy. You are struggling with anxiety or depression. Summary Many types of pain can be managed without opioids. Chronic pain may respond better to pain management without opioids. Pain is best managed when you and a team of health care providers work together. Pain management without opioids may include non-opioid medicines, medical treatments, physical therapy, mental health therapy, and lifestyle changes. Tell your health care providers if your pain gets worse or is not being managed well enough. This information is not intended to replace advice given to you by your health care provider. Make sure you discuss any questions you have with your health care provider. Document Revised: 01/23/2021 Document Reviewed: 01/23/2021 Elsevier Patient Education  2024 [employer].

## 2024-03-28 NOTE — Progress Notes (Addendum)
 Subjective:   Beth Huffman is a 63 y.o. who presents for a Medicare Wellness preventive visit.  As a reminder, Annual Wellness Visits don't include a physical exam, and some assessments may be limited, especially if this visit is performed virtually. We may recommend an in-person follow-up visit with your provider if needed.  Visit Complete: Virtual I connected with  [name] on 03/28/24 by a video and audio enabled telemedicine application and verified that I am speaking with the correct person using two identifiers.  Patient Location: Home  Provider Location: Home Office  I discussed the limitations of evaluation and management by telemedicine. The patient expressed understanding and agreed to proceed.  Vital Signs: Because this visit was a virtual/telehealth visit, some criteria may be missing or patient reported. Any vitals not documented were not able to be obtained and vitals that have been documented are patient reported.  Persons Participating in Visit: Patient.  AWV Questionnaire: Yes: Patient Medicare AWV questionnaire was completed by the patient on 03/27/2024; I have confirmed that all information answered by patient is correct and no changes since this date.  Cardiac Risk Factors include: advanced age (>54men, >3 women);dyslipidemia     Objective:     Today's Vitals   03/28/24 0921  Weight: 152 lb (68.9 kg)  Height: 5\' 5"  (1.651 m)   Body mass index is 25.29 kg/m.     03/28/2024    9:34 AM 11/20/2023    1:36 PM 12/19/2022   10:09 AM 06/20/2022    3:06 PM 04/24/2020    9:40 AM 11/29/2019   10:21 AM 06/14/2019    8:06 AM  Advanced Directives  Does Patient Have a Medical Advance Directive? Yes No Yes Yes Yes Yes Yes  Type of Estate agent of [place];Living will  Healthcare Power of [place];Living will;Out of facility DNR (pink MOST or yellow form) Living will;Healthcare Power of [employer] of [place];Living will   Healthcare Power of [place];Living will  Does patient want to make changes to medical advance directive?    No - Patient declined   No - Patient declined  Copy of Healthcare Power of Attorney in Chart? No - copy requested   No - copy requested   Yes - validated most recent copy scanned in chart (See row information)    Current Medications (verified) Outpatient Encounter Medications as of 03/28/2024  Medication Sig   albuterol  (VENTOLIN  HFA) 108 (90 Base) MCG/ACT inhaler Inhale 2 puffs into the lungs every 6 (six) hours as needed for wheezing or shortness of breath.   botulinum toxin Type A  (BOTOX ) 200 units injection Inject 155 units IM into multiple site in the face,neck and head once every 90 days   cholecalciferol (VITAMIN D ) 1000 UNITS tablet Take 1,000 Units by mouth daily.   cyclobenzaprine  (FLEXERIL ) 5 MG tablet TAKE 1 TABLET THREE TIMES DAILY AS NEEDED FOR MUSCLE SPASMS.   Erenumab -aooe (AIMOVIG ) 140 MG/ML SOAJ Inject 140 mg into the skin every 28 (twenty-eight) days.   estradiol  (ESTRACE ) 0.5 MG tablet Take 1 tablet (0.5 mg total) by mouth daily.   fluticasone  furoate-vilanterol (BREO ELLIPTA ) 100-25 MCG/ACT AEPB Inhale 1 puff into the lungs daily.   levalbuterol  (XOPENEX  HFA) 45 MCG/ACT inhaler Inhale 2 puffs into the lungs every 6 (six) hours as needed for wheezing or shortness of breath.   linaclotide  (LINZESS ) 72 MCG capsule Take 1 capsule (72 mcg total) by mouth daily before breakfast. Take 30 minutes before a meal  Multiple Vitamin (MULTIVITAMIN PO) Take 1 tablet by mouth daily.   naproxen  (NAPROSYN ) 500 MG tablet TAKE ONE TABLET BY MOUTH TWICE DAILY WITH A MEAL   nitroGLYCERIN  (NITROSTAT ) 0.4 MG SL tablet Place 1 tablet (0.4 mg total) under the tongue every 5 (five) minutes as needed for chest pain.   ondansetron  (ZOFRAN ) 8 MG tablet TAKE ONE TABLET BY MOUTH EVERY 8 HOURS AS NEEDED FOR NAUSEA OR FOR VOMITING   Oxycodone  HCl 10 MG TABS Take 1 tablet (10 mg total) by mouth every 6  (six) hours as needed.   Oxycodone  HCl 10 MG TABS Take 1 tablet (10 mg total) by mouth every 6 (six) hours as needed.   Oxycodone  HCl 10 MG TABS Take 1 tablet (10 mg total) by mouth every 6 (six) hours as needed.   oxymetazoline  (AFRIN NASAL SPRAY) 0.05 % nasal spray Place 1 spray into both nostrils 2 (two) times daily.   pantoprazole  (PROTONIX ) 40 MG tablet Take 1 tablet (40 mg total) by mouth 2 (two) times daily before a meal.   phentermine  (ADIPEX-P ) 37.5 MG tablet Take 1 tablet (37.5 mg total) by mouth daily before breakfast.   polyethylene glycol (MIRALAX  / GLYCOLAX ) packet Take 8.5 g by mouth every other day.   rizatriptan  (MAXALT ) 10 MG tablet TAKE 1 TABLET AT ONSET OF MIGRAINE- MAY REPEAT ONCE IN 2 HOURS. LIMIT 2/24 HOURS. AVOID DAILY USE.   senna (SENOKOT) 8.6 MG tablet Take 1 tablet by mouth as needed for constipation.   verapamil  (CALAN -SR) 120 MG CR tablet Take 1 tablet (120 mg total) by mouth at bedtime.   No facility-administered encounter medications on file as of 03/28/2024.    Allergies (verified) Atenolol , Cefuroxime axetil, Codeine sulfate, Fentanyl , Imdur  [isosorbide  nitrate], Nitrofurantoin, Pregabalin, Rapaflo [silodosin], Topamax  [topiramate ], Tramadol, Tramadol hcl, and Iodinated contrast media   History: Past Medical History:  Diagnosis Date   Abnormal weight gain    Acute sinusitis 12/05/2008   1/18 8/18   Arachnoid cyst 08/19/2011   Chest tightness or pressure 05/07/2018   Constipation 09/08/2012   Chronic - opioid related 11/17 Amitiza    Contact dermatitis 03/16/2013   5/14 relapsing - poison ivy    Dyspnea 05/07/2018   Elevated liver enzymes 03/04/2011   Chronic off and on    Female stress incontinence 02/06/2009   Qualifier: Diagnosis of  By: Plotnikov MD, Oakley Bellman    FEVER UNSPECIFIED 09/29/2007   Qualifier: Diagnosis of  By: Georgia Kipper MD, Oakley Bellman    GERD 09/29/2007   Chronic  Protonix  prn  Potential benefits of a long term PPI use as well as potential risks   and complications were explained to the patient and were aknowledged.     GERD (gastroesophageal reflux disease)    Headache 12/20/2007   Dr Gaylyn Keas Migraines (1 per 2 wks) Topamax  400 mg/d; Imitrex  prn; Ibuprofen  prn, Zofran  prn    Hyperglycemia 08/19/2011   Mild     Hypertension    Increased blood pressure (not hypertension) 08/23/2014   Intractable migraine 06/14/2009   Chronic Ibuprofen , Imitrex , Topamax ,  Nortriptyline  - intolerant   Knee pain 03/04/2011   R knee 2018 L knee pain - L knee b anserina and L knee is tender w/ROM: L knee pain is worse - patellofemoral syndrome vs other   LBP (low back pain)    Local anesthetic drug adverse reaction 01/21/2022   LOW BACK PAIN 09/29/2007   Chronic, lately (2016) disabling Due to syringomyelia On Oxycodone   Potential benefits of  a long term opioids use as well as potential risks (i.e. addiction risk, apnea etc) and complications (i.e. Somnolence, constipation and others) were explained to the patient and were aknowledged.     LUQ PAIN 05/23/2010   Qualifier: Diagnosis of  By: Plotnikov MD, Oakley Bellman    Migraine, chronic, without aura, intractable 10/28/2017   Botox  approved diagnosis. 7/19 Worse. Start Emagility inj. Relpax    Migraines    Nausea & vomiting 08/19/2011   Nausea without vomiting 06/14/2009   Due to migraines     NECK PAIN 06/14/2009    Chronic, lately (2016) disabling Due to syringomyelia On Oxycodone   Potential benefits of a long term opioids use as well as potential risks (i.e. addiction risk, apnea etc) and complications (i.e. Somnolence, constipation and others) were explained to the patient and were aknowledged.     NONSPECIFIC ABNORMAL RESULTS LIVR FUNCTION STUDY 09/28/2009   Qualifier: Diagnosis of  By: Georgia Kipper MD, Oakley Bellman    OBSESSIVE-COMPULSIVE DISORDER 05/23/2010   Chronic - compulsive shopping    Onychomycosis 01/30/2016   4/17    Orthostatic hypotension 06/30/2013   9/14 likely Rapaflo induced    OTITIS EXTERNA 05/10/2008    Qualifier: Diagnosis of  By: Georgia Kipper MD, Oakley Bellman    Palpitations 06/21/2014   PARESTHESIA 07/13/2008   Qualifier: Diagnosis of  By: Georgia Kipper MD, Oakley Bellman    Partial thickness burn of left wrist 09/11/2015   Postoperative state 07/24/2016   Pyelonephritis 2008   Rash 07/27/2017   Stress 2009   Syncope, near 06/30/2013   9/14 likely Rapaflo induced 12/14 relapsing off Rapaflo x 7 2/15 no relapsing since on 1/2 dose of Topamax     Syringobulbia (HCC)    SYRINGOMYELIA 08/20/2007   Chronic Dr Gracie Lav [facility] Chronic pain    Syringomyelia (HCC)    Tachycardia    Tonsillitis, chronic 02/26/2017   2018 worse   URINARY TRACT INFECTION (UTI) 03/23/2008   Qualifier: Diagnosis of  By: Georgia Kipper MD, Oakley Bellman    UTI (lower urinary tract infection)    Well adult exam 12/21/2013   We discussed age appropriate health related issues, including available/recomended screening tests and vaccinations. We discussed a need for adhering to healthy diet and exercise. Labs/EKG were reviewed/ordered. All questions were answered.      Past Surgical History:  Procedure Laterality Date   ABDOMINAL HYSTERECTOMY     ANTERIOR AND POSTERIOR REPAIR N/A 07/24/2016   Procedure: Possible ANTERIOR (CYSTOCELE) repair, perineoplasty;  Surgeon: Percy Bracken, MD;  Location: [facility];  Service: Gynecology;  Laterality: N/A;   BLADDER SUSPENSION N/A 07/24/2016   Procedure: TRANSVAGINAL TAPE (TVT) PROCEDURE;  Surgeon: Percy Bracken, MD;  Location: [facility];  Service: Gynecology;  Laterality: N/A;   COLONOSCOPY     CYSTOSCOPY N/A 07/24/2016   Procedure: CYSTOSCOPY;  Surgeon: Percy Bracken, MD;  Location: [facility];  Service: Gynecology;  Laterality: N/A;   FOOT SURGERY Right 2023   FOOT SURGERY Right 2023   Scraped top of foot   intracranial decompression surgery  2006   LEFT HEART CATH AND CORONARY ANGIOGRAPHY N/A 05/07/2018   Procedure: LEFT HEART CATH AND CORONARY ANGIOGRAPHY;  Surgeon: Arty Binning, MD;   Location: [facility];  Service: Cardiovascular;  Laterality: N/A;   PARTIAL KNEE ARTHROPLASTY Left 09/07/2018   Procedure: UNICOMPARTMENTAL LEFT KNEE;  Surgeon: Saundra Curl, MD;  Location: [facility];  Service: Orthopedics;  Laterality: Left;  Adductor Block   ROBOTIC ASSISTED LAPAROSCOPIC SACROCOLPOPEXY  N/A 07/24/2016   Procedure: ROBOTIC ASSISTED LAPAROSCOPIC SACROCOLPOPEXY WITH PERINEOPLASTY;  Surgeon: Marie-Lyne Lavoie, MD;  Location: [facility];  Service: Gynecology;  Laterality: N/A;   TUBAL LIGATION     Family History  Problem Relation Age of Onset   COPD Mother    Peripheral vascular disease Mother    Hypertension Mother    Heart disease Mother    Hypertension Father    Heart attack Maternal Grandmother    Stroke Maternal Grandfather    Stroke Paternal Grandfather    Breast cancer Maternal Aunt    Colon cancer Maternal Uncle 60   Anxiety disorder Other    Asthma Granddaughter    Social History   Socioeconomic History   Marital status: Married    Spouse name: Evan Hillock   Number of children: 2   Years of education: College   Highest education level: 12th grade  Occupational History   Occupation: Clinical biochemist: SELF-EMPLOYED   Occupation: disability  Tobacco Use   Smoking status: Never    Passive exposure: Past   Smokeless tobacco: Never  Vaping Use   Vaping status: Never Used  Substance and Sexual Activity   Alcohol use: No   Drug use: No   Sexual activity: Yes    Partners: Male    Birth control/protection: Surgical    Comment: BTL, Hyst; >16; <5  Other Topics Concern   Not on file  Social History Narrative   Lives at home with her husband and 1 dog./2025   Left handed   1 cup caffeine per day.   Social Drivers of Corporate investment banker Strain: Low Risk  (03/28/2024)   Overall Financial Resource Strain (CARDIA)    Difficulty of Paying Living Expenses: Not hard at all  Food Insecurity: No Food Insecurity (03/28/2024)   Hunger  Vital Sign    Worried About Running Out of Food in the Last Year: Never true    Ran Out of Food in the Last Year: Never true  Transportation Needs: No Transportation Needs (03/28/2024)   PRAPARE - Administrator, Civil Service (Medical): No    Lack of Transportation (Non-Medical): No  Physical Activity: Insufficiently Active (03/28/2024)   Exercise Vital Sign    Days of Exercise per Week: 3 days    Minutes of Exercise per Session: 40 min  Stress: No Stress Concern Present (03/28/2024)   [employer] of Occupational Health - Occupational Stress Questionnaire    Feeling of Stress : Not at all  Social Connections: Moderately Integrated (03/28/2024)   Social Connection and Isolation Panel [NHANES]    Frequency of Communication with Friends and Family: More than three times a week    Frequency of Social Gatherings with Friends and Family: Once a week    Attends Religious Services: Never    Database administrator or Organizations: Yes    Attends Banker Meetings: Never    Marital Status: Married    Tobacco Counseling Counseling given: Not Answered    Clinical Intake:  Pre-visit preparation completed: No  Pain : No/denies pain     BMI - recorded: 25.29 Nutritional Status: BMI 25 -29 Overweight Nutritional Risks: Nausea/ vomitting/ diarrhea Diabetes: No  Lab Results  Component Value Date   HGBA1C 5.5 03/31/2018   HGBA1C 5.3 02/25/2012   HGBA1C 5.3 08/19/2011     How often do you need to have someone help you when you read instructions, pamphlets, or other written materials  from your doctor or pharmacy?: 1 - Never  Interpreter Needed?: No  Information entered by :: Pinkey Mcjunkin, RMA   Activities of Daily Living     03/27/2024    4:56 AM 03/30/2023    7:16 AM  In your present state of health, do you have any difficulty performing the following activities:  Hearing? 0 0  Vision? 0 0  Difficulty concentrating or making decisions? 0 0  Walking or  climbing stairs? 1 0  Dressing or bathing? 0 0  Doing errands, shopping? 0 0  Preparing Food and eating ? N N  Using the Toilet? N N  In the past six months, have you accidently leaked urine? Y Y  Do you have problems with loss of bowel control? N N  Managing your Medications? N N  Managing your Finances? N N  Housekeeping or managing your Housekeeping? N N    Patient Care Team: Genia Kettering, MD as PCP - General Verona Goodwill, MD as PCP - Electrophysiology (Cardiology) Percy Bracken, MD as Consulting Physician (Obstetrics and Gynecology) Merriam Abbey, DO as Consulting Physician (Neurology) Verona Goodwill, MD as Consulting Physician (Cardiology) Dot Gazella, DPM as Consulting Physician (Podiatry) Sedalia Dacosta, MD as Referring Physician (Neurosurgery) Aleck Hurdle, MD as Consulting Physician (Pulmonary Disease) Phebe Brasil, OD (Optometry)  I have updated your Care Teams any recent Medical Services you may have received from other providers in the past year.     Assessment:    This is a routine wellness examination for Raihana.  Hearing/Vision screen Hearing Screening - Comments:: Denies hearing difficulties   Vision Screening - Comments:: Has some vision issues/Dr. Annabell Key   Goals Addressed               This Visit's Progress     Patient Stated (pt-stated)        Getting her eyes checked/2025       Depression Screen     03/28/2024    9:36 AM 10/01/2023   10:18 AM 02/18/2023    9:16 AM 12/24/2022   11:15 AM 11/19/2022    9:17 AM 06/20/2022    3:11 PM 07/31/2021    8:18 AM  PHQ 2/9 Scores  PHQ - 2 Score 0 0 0 0 0 0 0  PHQ- 9 Score 0      2    Fall Risk     03/27/2024    4:56 AM 11/20/2023    1:36 PM 10/01/2023   10:18 AM 03/30/2023    7:16 AM 03/09/2023    2:33 PM  Fall Risk   Falls in the past year? 0 0 0 0 1  Number falls in past yr: 0 0 0 0 0  Injury with Fall? 0 0 0 0 1  Risk for fall due to :   No Fall Risks  History of  fall(s)  Follow up Falls evaluation completed;Falls prevention discussed Falls evaluation completed Falls evaluation completed  Falls evaluation completed;Education provided;Falls prevention discussed    MEDICARE RISK AT HOME:  Medicare Risk at Home Any stairs in or around the home?: (Patient-Rptd) No If so, are there any without handrails?: (Patient-Rptd) No Home free of loose throw rugs in walkways, pet beds, electrical cords, etc?: (Patient-Rptd) Yes Adequate lighting in your home to reduce risk of falls?: (Patient-Rptd) Yes Life alert?: (Patient-Rptd) No Use of a cane, walker or w/c?: (Patient-Rptd) No Grab bars in the bathroom?: (Patient-Rptd) No Shower chair or bench in  shower?: (Patient-Rptd) Yes Elevated toilet seat or a handicapped toilet?: (Patient-Rptd) No  TIMED UP AND GO:  Was the test performed?  No  Cognitive Function: Declined/Normal: No cognitive concerns noted by patient or family. Patient alert, oriented, able to answer questions appropriately and recall recent events. No signs of memory loss or confusion.        06/20/2022    3:14 PM  6CIT Screen  What Year? 0 points  What month? 0 points  What time? 0 points  Count back from 20 0 points  Months in reverse 0 points  Repeat phrase 0 points  Total Score 0 points    Immunizations Immunization History  Administered Date(s) Administered   Influenza, High Dose Seasonal PF 12/12/2021   Influenza,inj,Quad PF,6+ Mos 09/14/2013, 06/27/2015, 07/25/2016, 07/27/2017, 08/03/2018, 07/29/2019, 07/31/2020, 07/31/2021, 08/19/2022   Influenza-Unspecified 09/13/2014, 07/24/2023   Moderna Sars-Covid-2 Vaccination 01/04/2020, 01/25/2020   PFIZER(Purple Top)SARS-COV-2 Vaccination 08/19/2020, 12/12/2021   Pfizer Covid-19 Vaccine Bivalent Booster 24yrs & up 07/15/2021   Pfizer(Comirnaty)Fall Seasonal Vaccine 12 years and older 07/24/2023   Pneumococcal Polysaccharide-23 07/25/2016   Tdap 09/28/2015   Zoster  Recombinant(Shingrix) 09/04/2021, 11/18/2021    Screening Tests Health Maintenance  Topic Date Due   HIV Screening  Never done   Hepatitis C Screening  Never done   Pneumococcal Vaccine 22-45 Years old (2 of 2 - PCV) 07/25/2017   COVID-19 Vaccine (7 - Mixed Product risk 2024-25 season) 01/21/2024   INFLUENZA VACCINE  05/27/2024   Medicare Annual Wellness (AWV)  03/28/2025   MAMMOGRAM  03/30/2025   DTaP/Tdap/Td (2 - Td or Tdap) 09/27/2025   Fecal DNA (Cologuard)  08/20/2026   Zoster Vaccines- Shingrix  Completed   HPV VACCINES  Aged Out   Meningococcal B Vaccine  Aged Out   Colonoscopy  Discontinued    Health Maintenance  Health Maintenance Due  Topic Date Due   HIV Screening  Never done   Hepatitis C Screening  Never done   Pneumococcal Vaccine 7-40 Years old (2 of 2 - PCV) 07/25/2017   COVID-19 Vaccine (7 - Mixed Product risk 2024-25 season) 01/21/2024   Health Maintenance Items Addressed: See Nurse Notes at the end of this note  Additional Screening:  Vision Screening: Recommended annual ophthalmology exams for early detection of glaucoma and other disorders of the eye. Would you like a referral to an eye doctor? No    Dental Screening: Recommended annual dental exams for proper oral hygiene  Community Resource Referral / Chronic Care Management: CRR required this visit?  No   CCM required this visit?  No   Plan:    I have personally reviewed and noted the following in the patient's chart:   Medical and social history Use of alcohol, tobacco or illicit drugs  Current medications and supplements including opioid prescriptions. Patient is currently taking opioid prescriptions. Information provided to patient regarding non-opioid alternatives. Patient advised to discuss non-opioid treatment plan with their provider. Functional ability and status Nutritional status Physical activity Advanced directives List of other physicians Hospitalizations, surgeries, and  ER visits in previous 12 months Vitals Screenings to include cognitive, depression, and falls Referrals and appointments  In addition, I have reviewed and discussed with patient certain preventive protocols, quality metrics, and best practice recommendations. A written personalized care plan for preventive services as well as general preventive health recommendations were provided to patient.   Shawnia Vizcarrondo L Genessis Flanary, CMA   03/28/2024   After Visit Summary: (MyChart) Due to this being a telephonic  visit, the after visit summary with patients personalized plan was offered to patient via MyChart   Notes: Please refer to Routing Comments.  Medical screening examination/treatment/procedure(s) were performed by non-physician practitioner and as supervising physician I was immediately available for consultation/collaboration.  I agree with above. Adelaide Holy, MD

## 2024-03-29 ENCOUNTER — Other Ambulatory Visit (HOSPITAL_COMMUNITY): Payer: Self-pay

## 2024-03-29 NOTE — Telephone Encounter (Signed)
 PA needed for Botox  as well

## 2024-03-29 NOTE — Telephone Encounter (Signed)
 PA request has been Submitted. New Encounter has been or will be created for follow up. For additional info see Pharmacy Prior Auth telephone encounter from 05/28.

## 2024-03-30 ENCOUNTER — Other Ambulatory Visit: Payer: Self-pay | Admitting: Neurology

## 2024-03-30 NOTE — Telephone Encounter (Signed)
 PA has been submitted, and telephone encounter has been created. Please see telephone encounter dated 4.7.25.

## 2024-03-31 ENCOUNTER — Ambulatory Visit: Admitting: Internal Medicine

## 2024-03-31 ENCOUNTER — Other Ambulatory Visit (HOSPITAL_COMMUNITY): Payer: Self-pay

## 2024-03-31 DIAGNOSIS — E2839 Other primary ovarian failure: Secondary | ICD-10-CM

## 2024-03-31 NOTE — Telephone Encounter (Signed)
 Yes, there is a PA from April that is still active.

## 2024-03-31 NOTE — Telephone Encounter (Signed)
 Yes that is correct.

## 2024-04-04 ENCOUNTER — Other Ambulatory Visit: Payer: Self-pay | Admitting: Obstetrics & Gynecology

## 2024-04-04 ENCOUNTER — Other Ambulatory Visit: Payer: Self-pay | Admitting: Gastroenterology

## 2024-04-04 DIAGNOSIS — K080 Exfoliation of teeth due to systemic causes: Secondary | ICD-10-CM | POA: Diagnosis not present

## 2024-04-05 ENCOUNTER — Encounter: Payer: Self-pay | Admitting: Internal Medicine

## 2024-04-05 ENCOUNTER — Ambulatory Visit: Admitting: Internal Medicine

## 2024-04-05 ENCOUNTER — Ambulatory Visit (INDEPENDENT_AMBULATORY_CARE_PROVIDER_SITE_OTHER)

## 2024-04-05 ENCOUNTER — Other Ambulatory Visit: Payer: Self-pay | Admitting: Obstetrics and Gynecology

## 2024-04-05 VITALS — BP 122/70 | HR 78 | Temp 98.3°F | Ht 65.0 in | Wt 151.0 lb

## 2024-04-05 DIAGNOSIS — G43709 Chronic migraine without aura, not intractable, without status migrainosus: Secondary | ICD-10-CM | POA: Diagnosis not present

## 2024-04-05 DIAGNOSIS — G8929 Other chronic pain: Secondary | ICD-10-CM

## 2024-04-05 DIAGNOSIS — M7062 Trochanteric bursitis, left hip: Secondary | ICD-10-CM

## 2024-04-05 DIAGNOSIS — M5441 Lumbago with sciatica, right side: Secondary | ICD-10-CM | POA: Diagnosis not present

## 2024-04-05 DIAGNOSIS — M25552 Pain in left hip: Secondary | ICD-10-CM | POA: Diagnosis not present

## 2024-04-05 DIAGNOSIS — K219 Gastro-esophageal reflux disease without esophagitis: Secondary | ICD-10-CM

## 2024-04-05 DIAGNOSIS — M5442 Lumbago with sciatica, left side: Secondary | ICD-10-CM

## 2024-04-05 DIAGNOSIS — R002 Palpitations: Secondary | ICD-10-CM | POA: Diagnosis not present

## 2024-04-05 DIAGNOSIS — R739 Hyperglycemia, unspecified: Secondary | ICD-10-CM

## 2024-04-05 DIAGNOSIS — Z0000 Encounter for general adult medical examination without abnormal findings: Secondary | ICD-10-CM

## 2024-04-05 MED ORDER — OXYCODONE HCL 10 MG PO TABS: 10.0000 mg | ORAL_TABLET | Freq: Four times a day (QID) | ORAL | 0 refills | Status: DC | PRN

## 2024-04-05 MED ORDER — ESTRADIOL 0.5 MG PO TABS: 0.5000 mg | ORAL_TABLET | Freq: Every day | ORAL | 0 refills | Status: DC

## 2024-04-05 MED ORDER — LINACLOTIDE 145 MCG PO CAPS: 145.0000 ug | ORAL_CAPSULE | Freq: Every day | ORAL | 3 refills | Status: DC

## 2024-04-05 NOTE — Assessment & Plan Note (Signed)
Due to syringomyelia On Oxycodone  Potential benefits of a long term opioids use as well as potential risks (i.e. addiction risk, apnea etc) and complications (i.e. Somnolence, constipation and others) were explained to the patient and were aknowledged. 

## 2024-04-05 NOTE — Assessment & Plan Note (Signed)
 Ref to Dr Leda Prude ray

## 2024-04-05 NOTE — Progress Notes (Signed)
 Subjective:  Patient ID: Beth Huffman, female    DOB: June 12, 1961  Age: 63 y.o. MRN: [id number]  CC: Palpitations (Pt states she notices the palpitations when she leans forwards.../Pt is requesting a referral to [facility] and is needing x-ray on lt hip due to pain on going after 2 injections.)   HPI Beth Huffman presents for hip pain, LBP, HAs  Outpatient Medications Prior to Visit  Medication Sig Dispense Refill   botulinum toxin Type A  (BOTOX ) 200 units injection Inject 155 units IM into multiple site in the face,neck and head once every 90 days 1 each 4   cholecalciferol (VITAMIN D ) 1000 UNITS tablet Take 1,000 Units by mouth daily.     cyclobenzaprine  (FLEXERIL ) 5 MG tablet TAKE 1 TABLET THREE TIMES DAILY AS NEEDED FOR MUSCLE SPASMS. 90 tablet 3   Erenumab -aooe (AIMOVIG ) 140 MG/ML SOAJ Inject 140 mg into the skin every 28 (twenty-eight) days. 1.12 mL 5   fluticasone  furoate-vilanterol (BREO ELLIPTA ) 100-25 MCG/ACT AEPB Inhale 1 puff into the lungs daily. 1 each 5   levalbuterol  (XOPENEX  HFA) 45 MCG/ACT inhaler Inhale 2 puffs into the lungs every 6 (six) hours as needed for wheezing or shortness of breath. 1 each 12   Multiple Vitamin (MULTIVITAMIN PO) Take 1 tablet by mouth daily.     naproxen  (NAPROSYN ) 500 MG tablet TAKE ONE TABLET BY MOUTH TWICE DAILY WITH A MEAL 60 tablet 3   nitroGLYCERIN  (NITROSTAT ) 0.4 MG SL tablet Place 1 tablet (0.4 mg total) under the tongue every 5 (five) minutes as needed for chest pain. 25 tablet 3   ondansetron  (ZOFRAN ) 8 MG tablet TAKE ONE TABLET BY MOUTH EVERY 8 HOURS AS NEEDED FOR NAUSEA OR FOR VOMITING 21 tablet 3   Oxycodone  HCl 10 MG TABS Take 1 tablet (10 mg total) by mouth every 6 (six) hours as needed. 120 tablet 0   oxymetazoline  (AFRIN NASAL SPRAY) 0.05 % nasal spray Place 1 spray into both nostrils 2 (two) times daily. 30 mL 0   pantoprazole  (PROTONIX ) 40 MG tablet Take 1 tablet (40 mg total) by mouth 2 (two) times daily before a  meal. 60 tablet 2   polyethylene glycol (MIRALAX  / GLYCOLAX ) packet Take 8.5 g by mouth every other day.     rizatriptan  (MAXALT ) 10 MG tablet TAKE 1 TABLET AT ONSET OF MIGRAINE- MAY REPEAT ONCE IN 2 HOURS. LIMIT 2/24 HOURS. AVOID DAILY USE. 8 tablet 5   senna (SENOKOT) 8.6 MG tablet Take 1 tablet by mouth as needed for constipation.     verapamil  (CALAN -SR) 120 MG CR tablet Take 1 tablet (120 mg total) by mouth at bedtime. 90 tablet 3   estradiol  (ESTRACE ) 0.5 MG tablet Take 1 tablet (0.5 mg total) by mouth daily. 90 tablet 4   linaclotide  (LINZESS ) 72 MCG capsule Take 1 capsule (72 mcg total) by mouth daily before breakfast. Take 30 minutes before a meal 30 capsule 5   Oxycodone  HCl 10 MG TABS Take 1 tablet (10 mg total) by mouth every 6 (six) hours as needed. 120 tablet 0   Oxycodone  HCl 10 MG TABS Take 1 tablet (10 mg total) by mouth every 6 (six) hours as needed. 120 tablet 0   phentermine  (ADIPEX-P ) 37.5 MG tablet Take 1 tablet (37.5 mg total) by mouth daily before breakfast. 30 tablet 3   albuterol  (VENTOLIN  HFA) 108 (90 Base) MCG/ACT inhaler Inhale 2 puffs into the lungs every 6 (six) hours as needed for wheezing or shortness  of breath. 18 g 2   No facility-administered medications prior to visit.    ROS: Review of Systems  Constitutional:  Positive for fatigue. Negative for activity change, appetite change, chills and unexpected weight change.  HENT:  Negative for congestion, mouth sores and sinus pressure.   Eyes:  Negative for visual disturbance.  Respiratory:  Negative for cough and chest tightness.   Cardiovascular:  Negative for chest pain.  Gastrointestinal:  Negative for abdominal pain and nausea.  Genitourinary:  Negative for difficulty urinating, frequency and vaginal pain.  Musculoskeletal:  Positive for back pain and gait problem.  Skin:  Negative for pallor and rash.  Neurological:  Positive for headaches. Negative for dizziness, tremors, weakness and numbness.   Psychiatric/Behavioral:  Negative for confusion and sleep disturbance. The patient is not nervous/anxious.     Objective:  BP 122/70   Pulse 78   Temp 98.3 F (36.8 C) (Oral)   Ht 5' 5 (1.651 m)   Wt 151 lb (68.5 kg)   SpO2 95%   BMI 25.13 kg/m   BP Readings from Last 3 Encounters:  04/05/24 122/70  02/29/24 124/76  02/15/24 (!) 134/92    Wt Readings from Last 3 Encounters:  04/05/24 151 lb (68.5 kg)  03/28/24 152 lb (68.9 kg)  02/29/24 152 lb (68.9 kg)    Physical Exam Constitutional:      General: She is not in acute distress.    Appearance: Normal appearance. She is well-developed.  HENT:     Head: Normocephalic.     Right Ear: External ear normal.     Left Ear: External ear normal.     Nose: Nose normal.   Eyes:     General:        Right eye: No discharge.        Left eye: No discharge.     Conjunctiva/sclera: Conjunctivae normal.     Pupils: Pupils are equal, round, and reactive to light.   Neck:     Thyroid : No thyromegaly.     Vascular: No JVD.     Trachea: No tracheal deviation.   Cardiovascular:     Rate and Rhythm: Normal rate and regular rhythm.     Heart sounds: Normal heart sounds.  Pulmonary:     Effort: No respiratory distress.     Breath sounds: No stridor. No wheezing.  Abdominal:     General: Bowel sounds are normal. There is no distension.     Palpations: Abdomen is soft. There is no mass.     Tenderness: There is no abdominal tenderness. There is no guarding or rebound.   Musculoskeletal:        General: Tenderness present.     Cervical back: Normal range of motion and neck supple. No rigidity.     Right lower leg: No edema.     Left lower leg: No edema.  Lymphadenopathy:     Cervical: No cervical adenopathy.   Skin:    Findings: No erythema or rash.   Neurological:     Cranial Nerves: No cranial nerve deficit.     Motor: No abnormal muscle tone.     Coordination: Coordination normal.     Deep Tendon Reflexes: Reflexes  normal.   Psychiatric:        Behavior: Behavior normal.        Thought Content: Thought content normal.        Judgment: Judgment normal.   L hip, LS spine w/pain  Lab Results  Component Value Date   WBC 4.6 07/06/2023   HGB 13.0 07/06/2023   HCT 40.5 07/06/2023   PLT 223.0 07/06/2023   GLUCOSE 87 07/06/2023   CHOL 203 (H) 07/06/2023   TRIG 79.0 07/06/2023   HDL 63.30 07/06/2023   LDLDIRECT 130.2 12/21/2013   LDLCALC 124 (H) 07/06/2023   ALT 16 07/06/2023   AST 23 07/06/2023   NA 140 07/06/2023   K 4.2 07/06/2023   CL 104 07/06/2023   CREATININE 0.94 07/06/2023   BUN 11 07/06/2023   CO2 30 07/06/2023   TSH 1.72 07/06/2023   HGBA1C 5.5 03/31/2018    MM 3D DIAGNOSTIC MAMMOGRAM BILATERAL BREAST Result Date: 03/31/2023 CLINICAL DATA:  63 year old female presenting for evaluation of a new lump in the inferior right breast and for annual exam of the left breast. EXAM: DIGITAL DIAGNOSTIC BILATERAL MAMMOGRAM WITH TOMOSYNTHESIS; ULTRASOUND RIGHT BREAST LIMITED TECHNIQUE: Bilateral digital diagnostic mammography and breast tomosynthesis was performed.; Targeted ultrasound examination of the right breast was performed COMPARISON:  Previous exam(s). ACR Breast Density Category c: The breasts are heterogeneously dense, which may obscure small masses. FINDINGS: Mammogram: Right breast: A skin BB marks the palpable site of concern reported by the patient in the central inferior right breast. Spot tangential view of this area was performed in addition to standard views. There is no new abnormality at the palpable site or elsewhere in the right breast is suggested presence of malignancy. Left breast: No suspicious mass, distortion, or microcalcifications are identified to suggest presence of malignancy. On physical exam at the site of concern reported by the patient in the central inferior right breast I do not feel a discrete mass or focal area of thickening. Ultrasound: Targeted ultrasound  performed at the palpable site of concern in the right breast at 7 o'clock 6 cm from the nipple demonstrating no cystic or solid mass. IMPRESSION: 1. No mammographic or sonographic evidence of malignancy at the palpable site of concern in the right breast. 2. No mammographic evidence of malignancy in the left breast. RECOMMENDATION: 1. Recommend any further workup of the palpable site in the right breast be on a clinical basis given negative imaging findings. 2.  Screening mammogram in one year.(Code:SM-B-01Y) I have discussed the findings and recommendations with the patient. If applicable, a reminder letter will be sent to the patient regarding the next appointment. BI-RADS CATEGORY  1: Negative. Electronically Signed   By: Allena Ito M.D.   On: 03/31/2023 09:26   US  LIMITED ULTRASOUND INCLUDING AXILLA RIGHT BREAST Result Date: 03/31/2023 CLINICAL DATA:  63 year old female presenting for evaluation of a new lump in the inferior right breast and for annual exam of the left breast. EXAM: DIGITAL DIAGNOSTIC BILATERAL MAMMOGRAM WITH TOMOSYNTHESIS; ULTRASOUND RIGHT BREAST LIMITED TECHNIQUE: Bilateral digital diagnostic mammography and breast tomosynthesis was performed.; Targeted ultrasound examination of the right breast was performed COMPARISON:  Previous exam(s). ACR Breast Density Category c: The breasts are heterogeneously dense, which may obscure small masses. FINDINGS: Mammogram: Right breast: A skin BB marks the palpable site of concern reported by the patient in the central inferior right breast. Spot tangential view of this area was performed in addition to standard views. There is no new abnormality at the palpable site or elsewhere in the right breast is suggested presence of malignancy. Left breast: No suspicious mass, distortion, or microcalcifications are identified to suggest presence of malignancy. On physical exam at the site of concern reported by the patient in the central inferior right  breast  I do not feel a discrete mass or focal area of thickening. Ultrasound: Targeted ultrasound performed at the palpable site of concern in the right breast at 7 o'clock 6 cm from the nipple demonstrating no cystic or solid mass. IMPRESSION: 1. No mammographic or sonographic evidence of malignancy at the palpable site of concern in the right breast. 2. No mammographic evidence of malignancy in the left breast. RECOMMENDATION: 1. Recommend any further workup of the palpable site in the right breast be on a clinical basis given negative imaging findings. 2.  Screening mammogram in one year.(Code:SM-B-01Y) I have discussed the findings and recommendations with the patient. If applicable, a reminder letter will be sent to the patient regarding the next appointment. BI-RADS CATEGORY  1: Negative. Electronically Signed   By: Allena Ito M.D.   On: 03/31/2023 09:26   Assessment & Plan:   Problem List Items Addressed This Visit     Migraine headache   Migraines 10-15/month Maxalt , Ibuprofen  prn, Zofran  prn       Relevant Medications   Oxycodone  HCl 10 MG TABS   Oxycodone  HCl 10 MG TABS   GERD   Continue on Protonix  po      Relevant Medications   linaclotide  (LINZESS ) 145 MCG CAPS capsule   LOW BACK PAIN - Primary    Due to syringomyelia On Oxycodone   Potential benefits of a long term opioids use as well as potential risks (i.e. addiction risk, apnea etc) and complications (i.e. Somnolence, constipation and others) were explained to the patient and were aknowledged.      Relevant Medications   Oxycodone  HCl 10 MG TABS   Oxycodone  HCl 10 MG TABS   Hyperglycemia   Well adult exam   Relevant Orders   TSH   Urinalysis   CBC with Differential/Platelet   Lipid panel   Comprehensive metabolic panel with GFR   Palpitations   Relevant Orders   EKG 12-Lead   Trochanteric bursitis of left hip   Ref to Dr Leda Prude ray      Relevant Orders   Ambulatory referral to Orthopedic  Surgery   DG HIP UNILAT WITH PELVIS 2-3 VIEWS LEFT (Completed)      Meds ordered this encounter  Medications   linaclotide  (LINZESS ) 145 MCG CAPS capsule    Sig: Take 1 capsule (145 mcg total) by mouth daily before breakfast.    Dispense:  90 capsule    Refill:  3   estradiol  (ESTRACE ) 0.5 MG tablet    Sig: Take 1 tablet (0.5 mg total) by mouth daily.    Dispense:  90 tablet    Refill:  0    This is to replace the Est Patch that was too expensive. thanks   Oxycodone  HCl 10 MG TABS    Sig: Take 1 tablet (10 mg total) by mouth every 6 (six) hours as needed.    Dispense:  120 tablet    Refill:  0    Please fill on or after 05/05/2024   Oxycodone  HCl 10 MG TABS    Sig: Take 1 tablet (10 mg total) by mouth every 6 (six) hours as needed.    Dispense:  120 tablet    Refill:  0    Please fill on 06/04/24      Follow-up: Return in about 3 months (around 07/06/2024) for a follow-up visit.  Anitra Barn, MD

## 2024-04-05 NOTE — Telephone Encounter (Signed)
 Medication refill request: estrace  0.5mg  tablet Last AEX:  02-13-22 Next AEX: none Rx was sent in by pcp. This rx denied

## 2024-04-05 NOTE — Assessment & Plan Note (Addendum)
 Continue on Protonix  po

## 2024-04-05 NOTE — Telephone Encounter (Signed)
 Needs dxa.  Baseline sent in Dr. Tia Flowers

## 2024-04-05 NOTE — Progress Notes (Signed)
 Subjective:  Patient ID: Beth Huffman, female    DOB: Mar 01, 1961  Age: 63 y.o. MRN: [id number]  CC: Palpitations (Pt states she notices the palpitations when she leans forwards.../Pt is requesting a referral to [facility] and is needing x-ray on lt hip due to pain on going after 2 injections.)   HPI [name] presents for L hip pain - refractory  Outpatient Medications Prior to Visit  Medication Sig Dispense Refill   botulinum toxin Type A  (BOTOX ) 200 units injection Inject 155 units IM into multiple site in the face,neck and head once every 90 days 1 each 4   cholecalciferol (VITAMIN D ) 1000 UNITS tablet Take 1,000 Units by mouth daily.     cyclobenzaprine  (FLEXERIL ) 5 MG tablet TAKE 1 TABLET THREE TIMES DAILY AS NEEDED FOR MUSCLE SPASMS. 90 tablet 3   Erenumab -aooe (AIMOVIG ) 140 MG/ML SOAJ Inject 140 mg into the skin every 28 (twenty-eight) days. 1.12 mL 5   fluticasone  furoate-vilanterol (BREO ELLIPTA ) 100-25 MCG/ACT AEPB Inhale 1 puff into the lungs daily. 1 each 5   levalbuterol  (XOPENEX  HFA) 45 MCG/ACT inhaler Inhale 2 puffs into the lungs every 6 (six) hours as needed for wheezing or shortness of breath. 1 each 12   Multiple Vitamin (MULTIVITAMIN PO) Take 1 tablet by mouth daily.     naproxen  (NAPROSYN ) 500 MG tablet TAKE ONE TABLET BY MOUTH TWICE DAILY WITH A MEAL 60 tablet 3   nitroGLYCERIN  (NITROSTAT ) 0.4 MG SL tablet Place 1 tablet (0.4 mg total) under the tongue every 5 (five) minutes as needed for chest pain. 25 tablet 3   ondansetron  (ZOFRAN ) 8 MG tablet TAKE ONE TABLET BY MOUTH EVERY 8 HOURS AS NEEDED FOR NAUSEA OR FOR VOMITING 21 tablet 3   Oxycodone  HCl 10 MG TABS Take 1 tablet (10 mg total) by mouth every 6 (six) hours as needed. 120 tablet 0   oxymetazoline  (AFRIN NASAL SPRAY) 0.05 % nasal spray Place 1 spray into both nostrils 2 (two) times daily. 30 mL 0   pantoprazole  (PROTONIX ) 40 MG tablet Take 1 tablet (40 mg total) by mouth 2 (two) times daily  before a meal. 60 tablet 2   polyethylene glycol (MIRALAX  / GLYCOLAX ) packet Take 8.5 g by mouth every other day.     rizatriptan  (MAXALT ) 10 MG tablet TAKE 1 TABLET AT ONSET OF MIGRAINE- MAY REPEAT ONCE IN 2 HOURS. LIMIT 2/24 HOURS. AVOID DAILY USE. 8 tablet 5   senna (SENOKOT) 8.6 MG tablet Take 1 tablet by mouth as needed for constipation.     verapamil  (CALAN -SR) 120 MG CR tablet Take 1 tablet (120 mg total) by mouth at bedtime. 90 tablet 3   estradiol  (ESTRACE ) 0.5 MG tablet Take 1 tablet (0.5 mg total) by mouth daily. 90 tablet 4   linaclotide  (LINZESS ) 72 MCG capsule Take 1 capsule (72 mcg total) by mouth daily before breakfast. Take 30 minutes before a meal 30 capsule 5   Oxycodone  HCl 10 MG TABS Take 1 tablet (10 mg total) by mouth every 6 (six) hours as needed. 120 tablet 0   Oxycodone  HCl 10 MG TABS Take 1 tablet (10 mg total) by mouth every 6 (six) hours as needed. 120 tablet 0   phentermine  (ADIPEX-P ) 37.5 MG tablet Take 1 tablet (37.5 mg total) by mouth daily before breakfast. 30 tablet 3   albuterol  (VENTOLIN  HFA) 108 (90 Base) MCG/ACT inhaler Inhale 2 puffs into the lungs every 6 (six) hours as needed for wheezing or  shortness of breath. 18 g 2   No facility-administered medications prior to visit.    ROS: Review of Systems  Constitutional:  Negative for activity change, appetite change, chills, fatigue and unexpected weight change.  HENT:  Negative for congestion, mouth sores and sinus pressure.   Eyes:  Negative for visual disturbance.  Respiratory:  Negative for cough and chest tightness.   Gastrointestinal:  Negative for abdominal pain and nausea.  Genitourinary:  Negative for difficulty urinating, frequency and vaginal pain.  Musculoskeletal:  Negative for back pain and gait problem.  Skin:  Negative for pallor and rash.  Neurological:  Negative for dizziness, tremors, weakness, numbness and headaches.  Psychiatric/Behavioral:  Negative for confusion, sleep disturbance  and suicidal ideas.     Objective:  BP 122/70   Pulse 78   Temp 98.3 F (36.8 C) (Oral)   Ht 5\' 5"  (1.651 m)   Wt 151 lb (68.5 kg)   SpO2 95%   BMI 25.13 kg/m   BP Readings from Last 3 Encounters:  04/05/24 122/70  02/29/24 124/76  02/15/24 (!) 134/92    Wt Readings from Last 3 Encounters:  04/05/24 151 lb (68.5 kg)  03/28/24 152 lb (68.9 kg)  02/29/24 152 lb (68.9 kg)    Physical Exam Constitutional:      General: She is not in acute distress.    Appearance: She is well-developed.  HENT:     Head: Normocephalic.     Right Ear: External ear normal.     Left Ear: External ear normal.     Nose: Nose normal.  Eyes:     General:        Right eye: No discharge.        Left eye: No discharge.     Conjunctiva/sclera: Conjunctivae normal.     Pupils: Pupils are equal, round, and reactive to light.  Neck:     Thyroid : No thyromegaly.     Vascular: No JVD.     Trachea: No tracheal deviation.  Cardiovascular:     Rate and Rhythm: Normal rate and regular rhythm.     Heart sounds: Normal heart sounds.  Pulmonary:     Effort: No respiratory distress.     Breath sounds: No stridor. No wheezing.  Abdominal:     General: Bowel sounds are normal. There is no distension.     Palpations: Abdomen is soft. There is no mass.     Tenderness: There is no abdominal tenderness. There is no guarding or rebound.  Musculoskeletal:        General: No tenderness.     Cervical back: Normal range of motion and neck supple. No rigidity.  Lymphadenopathy:     Cervical: No cervical adenopathy.  Skin:    Findings: No erythema or rash.  Neurological:     Cranial Nerves: No cranial nerve deficit.     Motor: No abnormal muscle tone.     Coordination: Coordination normal.     Deep Tendon Reflexes: Reflexes normal.  Psychiatric:        Behavior: Behavior normal.        Thought Content: Thought content normal.        Judgment: Judgment normal.    L hip w/pain LS spine w/pain  Lab  Results  Component Value Date   WBC 4.6 07/06/2023   HGB 13.0 07/06/2023   HCT 40.5 07/06/2023   PLT 223.0 07/06/2023   GLUCOSE 87 07/06/2023   CHOL 203 (H) 07/06/2023   TRIG  79.0 07/06/2023   HDL 63.30 07/06/2023   LDLDIRECT 130.2 12/21/2013   LDLCALC 124 (H) 07/06/2023   ALT 16 07/06/2023   AST 23 07/06/2023   NA 140 07/06/2023   K 4.2 07/06/2023   CL 104 07/06/2023   CREATININE 0.94 07/06/2023   BUN 11 07/06/2023   CO2 30 07/06/2023   TSH 1.72 07/06/2023   HGBA1C 5.5 03/31/2018    MM 3D DIAGNOSTIC MAMMOGRAM BILATERAL BREAST Result Date: 03/31/2023 CLINICAL DATA:  63 year old female presenting for evaluation of a new lump in the inferior right breast and for annual exam of the left breast. EXAM: DIGITAL DIAGNOSTIC BILATERAL MAMMOGRAM WITH TOMOSYNTHESIS; ULTRASOUND RIGHT BREAST LIMITED TECHNIQUE: Bilateral digital diagnostic mammography and breast tomosynthesis was performed.; Targeted ultrasound examination of the right breast was performed COMPARISON:  Previous exam(s). ACR Breast Density Category c: The breasts are heterogeneously dense, which may obscure small masses. FINDINGS: Mammogram: Right breast: A skin BB marks the palpable site of concern reported by the patient in the central inferior right breast. Spot tangential view of this area was performed in addition to standard views. There is no new abnormality at the palpable site or elsewhere in the right breast is suggested presence of malignancy. Left breast: No suspicious mass, distortion, or microcalcifications are identified to suggest presence of malignancy. On physical exam at the site of concern reported by the patient in the central inferior right breast I do not feel a discrete mass or focal area of thickening. Ultrasound: Targeted ultrasound performed at the palpable site of concern in the right breast at 7 o'clock 6 cm from the nipple demonstrating no cystic or solid mass. IMPRESSION: 1. No mammographic or sonographic  evidence of malignancy at the palpable site of concern in the right breast. 2. No mammographic evidence of malignancy in the left breast. RECOMMENDATION: 1. Recommend any further workup of the palpable site in the right breast be on a clinical basis given negative imaging findings. 2.  Screening mammogram in one year.(Code:SM-B-01Y) I have discussed the findings and recommendations with the patient. If applicable, a reminder letter will be sent to the patient regarding the next appointment. BI-RADS CATEGORY  1: Negative. Electronically Signed   By: Allena Ito M.D.   On: 03/31/2023 09:26   US  LIMITED ULTRASOUND INCLUDING AXILLA RIGHT BREAST Result Date: 03/31/2023 CLINICAL DATA:  63 year old female presenting for evaluation of a new lump in the inferior right breast and for annual exam of the left breast. EXAM: DIGITAL DIAGNOSTIC BILATERAL MAMMOGRAM WITH TOMOSYNTHESIS; ULTRASOUND RIGHT BREAST LIMITED TECHNIQUE: Bilateral digital diagnostic mammography and breast tomosynthesis was performed.; Targeted ultrasound examination of the right breast was performed COMPARISON:  Previous exam(s). ACR Breast Density Category c: The breasts are heterogeneously dense, which may obscure small masses. FINDINGS: Mammogram: Right breast: A skin BB marks the palpable site of concern reported by the patient in the central inferior right breast. Spot tangential view of this area was performed in addition to standard views. There is no new abnormality at the palpable site or elsewhere in the right breast is suggested presence of malignancy. Left breast: No suspicious mass, distortion, or microcalcifications are identified to suggest presence of malignancy. On physical exam at the site of concern reported by the patient in the central inferior right breast I do not feel a discrete mass or focal area of thickening. Ultrasound: Targeted ultrasound performed at the palpable site of concern in the right breast at 7 o'clock 6 cm from  the nipple demonstrating no cystic  or solid mass. IMPRESSION: 1. No mammographic or sonographic evidence of malignancy at the palpable site of concern in the right breast. 2. No mammographic evidence of malignancy in the left breast. RECOMMENDATION: 1. Recommend any further workup of the palpable site in the right breast be on a clinical basis given negative imaging findings. 2.  Screening mammogram in one year.(Code:SM-B-01Y) I have discussed the findings and recommendations with the patient. If applicable, a reminder letter will be sent to the patient regarding the next appointment. BI-RADS CATEGORY  1: Negative. Electronically Signed   By: Allena Ito M.D.   On: 03/31/2023 09:26   Assessment & Plan:   Problem List Items Addressed This Visit     GERD   Protonix  po      Relevant Medications   linaclotide  (LINZESS ) 145 MCG CAPS capsule   LOW BACK PAIN - Primary    Due to syringomyelia On Oxycodone   Potential benefits of a long term opioids use as well as potential risks (i.e. addiction risk, apnea etc) and complications (i.e. Somnolence, constipation and others) were explained to the patient and were aknowledged.      Relevant Medications   Oxycodone  HCl 10 MG TABS   Oxycodone  HCl 10 MG TABS   Hyperglycemia   Palpitations   Relevant Orders   EKG 12-Lead   Trochanteric bursitis of left hip   Ref to Dr Leda Prude ray      Relevant Orders   Ambulatory referral to Orthopedic Surgery   XR HIP UNILAT W OR W/O PELVIS 2-3 VIEWS LEFT      Meds ordered this encounter  Medications   linaclotide  (LINZESS ) 145 MCG CAPS capsule    Sig: Take 1 capsule (145 mcg total) by mouth daily before breakfast.    Dispense:  90 capsule    Refill:  3   estradiol  (ESTRACE ) 0.5 MG tablet    Sig: Take 1 tablet (0.5 mg total) by mouth daily.    Dispense:  90 tablet    Refill:  0    This is to replace the Est Patch that was too expensive. thanks   Oxycodone  HCl 10 MG TABS    Sig: Take 1 tablet  (10 mg total) by mouth every 6 (six) hours as needed.    Dispense:  120 tablet    Refill:  0    Please fill on or after 05/05/2024   Oxycodone  HCl 10 MG TABS    Sig: Take 1 tablet (10 mg total) by mouth every 6 (six) hours as needed.    Dispense:  120 tablet    Refill:  0    Please fill on 06/04/24      Follow-up: Return in about 3 months (around 07/06/2024) for a follow-up visit.  Anitra Barn, MD

## 2024-04-06 ENCOUNTER — Encounter: Payer: Self-pay | Admitting: Internal Medicine

## 2024-04-06 DIAGNOSIS — Z1231 Encounter for screening mammogram for malignant neoplasm of breast: Secondary | ICD-10-CM

## 2024-04-06 NOTE — Telephone Encounter (Signed)
 Patients diagnostic MMG dated 03/31/23 is in EPIC.

## 2024-04-06 NOTE — Telephone Encounter (Signed)
See patient concerns.

## 2024-04-06 NOTE — Telephone Encounter (Signed)
 If you are referring to the BMD from 06/08/2003, it is in EPIC.

## 2024-04-06 NOTE — Telephone Encounter (Signed)
 Breast imaging has the recent bone scan To get records Beth Huffman- can we request  Spoke with patient and she is happy to release for us  to review and appreciative of the call. Recent xrays for bursitis No fractures

## 2024-04-06 NOTE — Telephone Encounter (Signed)
 Call placed to [facility], no imaging on file for this patient.   Per EPIC, no recent BMD at [facility].   Call placed to patient, no answer.

## 2024-04-11 ENCOUNTER — Ambulatory Visit: Payer: Self-pay | Admitting: Internal Medicine

## 2024-04-11 NOTE — Assessment & Plan Note (Signed)
 Migraines 10-15/month Maxalt , Ibuprofen  prn, Zofran  prn

## 2024-04-12 DIAGNOSIS — K080 Exfoliation of teeth due to systemic causes: Secondary | ICD-10-CM | POA: Diagnosis not present

## 2024-04-15 ENCOUNTER — Ambulatory Visit: Admitting: Neurology

## 2024-04-15 DIAGNOSIS — G43709 Chronic migraine without aura, not intractable, without status migrainosus: Secondary | ICD-10-CM

## 2024-04-15 MED ORDER — ONABOTULINUMTOXINA 100 UNITS IJ SOLR
200.0000 [IU] | Freq: Once | INTRAMUSCULAR | Status: AC
  Administered 2024-04-15: 155 [IU] via INTRAMUSCULAR

## 2024-04-15 NOTE — Telephone Encounter (Signed)
 Patient in office for Botox  and wanted to check the status of PA

## 2024-04-15 NOTE — Telephone Encounter (Signed)
  Patient in office for Botox  and wanted to check the status of PA

## 2024-04-15 NOTE — Progress Notes (Signed)

## 2024-04-20 ENCOUNTER — Other Ambulatory Visit (HOSPITAL_COMMUNITY): Payer: Self-pay

## 2024-04-20 NOTE — Telephone Encounter (Signed)
 This PA is has been approved. Test billing results with [facility] returns a $45 copay 16 tabs/30 days.

## 2024-04-25 ENCOUNTER — Other Ambulatory Visit: Payer: Self-pay | Admitting: Internal Medicine

## 2024-04-26 ENCOUNTER — Ambulatory Visit
Admission: RE | Admit: 2024-04-26 | Discharge: 2024-04-26 | Disposition: A | Discharge status: Home / Self Care | Source: Ambulatory Visit | Attending: Obstetrics and Gynecology

## 2024-04-26 DIAGNOSIS — Z1231 Encounter for screening mammogram for malignant neoplasm of breast: Secondary | ICD-10-CM

## 2024-04-28 ENCOUNTER — Other Ambulatory Visit: Payer: Self-pay | Admitting: Obstetrics and Gynecology

## 2024-04-28 ENCOUNTER — Ambulatory Visit: Payer: Self-pay | Admitting: Obstetrics and Gynecology

## 2024-04-28 DIAGNOSIS — N63 Unspecified lump in unspecified breast: Secondary | ICD-10-CM

## 2024-04-28 DIAGNOSIS — K080 Exfoliation of teeth due to systemic causes: Secondary | ICD-10-CM | POA: Diagnosis not present

## 2024-05-02 ENCOUNTER — Telehealth: Payer: Self-pay | Admitting: Obstetrics and Gynecology

## 2024-05-02 ENCOUNTER — Other Ambulatory Visit: Payer: Self-pay | Admitting: Obstetrics and Gynecology

## 2024-05-02 ENCOUNTER — Other Ambulatory Visit (HOSPITAL_COMMUNITY): Payer: Self-pay

## 2024-05-02 ENCOUNTER — Ambulatory Visit: Payer: Self-pay | Admitting: Obstetrics and Gynecology

## 2024-05-02 ENCOUNTER — Ambulatory Visit
Admission: RE | Admit: 2024-05-02 | Discharge: 2024-05-02 | Disposition: A | Discharge status: Home / Self Care | Source: Ambulatory Visit | Attending: Obstetrics and Gynecology

## 2024-05-02 ENCOUNTER — Other Ambulatory Visit: Payer: Self-pay | Admitting: Neurology

## 2024-05-02 DIAGNOSIS — N6002 Solitary cyst of left breast: Secondary | ICD-10-CM | POA: Diagnosis not present

## 2024-05-02 DIAGNOSIS — R928 Other abnormal and inconclusive findings on diagnostic imaging of breast: Secondary | ICD-10-CM

## 2024-05-02 DIAGNOSIS — N63 Unspecified lump in unspecified breast: Secondary | ICD-10-CM

## 2024-05-02 MED ORDER — AIMOVIG 140 MG/ML ~~LOC~~ SOAJ: 140.0000 mg | SUBCUTANEOUS | 5 refills | Status: DC

## 2024-05-02 NOTE — Telephone Encounter (Signed)
 Bone density. Desires this at [employer] placed

## 2024-05-02 NOTE — Telephone Encounter (Signed)
 Although I received confirmation that PA was submitted successfully via fax, they state they have not received it. I have refaxed this to them again.

## 2024-05-02 NOTE — Telephone Encounter (Signed)
 Med refill request: estradiol  (estrace ) Last AEX: 03/09/23 ML Next AEX:not scheduled, sent message to front desk to schedule patient for annual visit Last MMG (if hormonal med) 04/26/24 Refill authorized: Last Rx sent #[phone] with zero refills on 04/05/24 . Please approve or deny

## 2024-05-04 ENCOUNTER — Other Ambulatory Visit: Payer: Self-pay | Admitting: Internal Medicine

## 2024-05-04 NOTE — Telephone Encounter (Signed)
 Pharmacy Patient Advocate Encounter  Received notification from [facility] that Prior Authorization for AIMOVIG 140MG  has been DENIED.  Full denial letter will be uploaded to the media tab. See denial reason below.

## 2024-05-06 ENCOUNTER — Encounter: Payer: Self-pay | Admitting: Gastroenterology

## 2024-05-06 ENCOUNTER — Other Ambulatory Visit: Payer: Self-pay | Admitting: Gastroenterology

## 2024-05-06 ENCOUNTER — Other Ambulatory Visit (HOSPITAL_COMMUNITY): Payer: Self-pay

## 2024-05-06 ENCOUNTER — Telehealth: Payer: Self-pay | Admitting: Pharmacist

## 2024-05-06 ENCOUNTER — Other Ambulatory Visit: Payer: Self-pay | Admitting: Neurology

## 2024-05-06 ENCOUNTER — Ambulatory Visit: Admitting: Neurology

## 2024-05-06 ENCOUNTER — Ambulatory Visit: Admitting: Gastroenterology

## 2024-05-06 ENCOUNTER — Telehealth: Payer: Self-pay | Admitting: Neurology

## 2024-05-06 VITALS — BP 138/88 | HR 90 | Ht 65.0 in | Wt 152.0 lb

## 2024-05-06 DIAGNOSIS — T402X5A Adverse effect of other opioids, initial encounter: Secondary | ICD-10-CM

## 2024-05-06 DIAGNOSIS — K5903 Drug induced constipation: Secondary | ICD-10-CM

## 2024-05-06 DIAGNOSIS — T402X5D Adverse effect of other opioids, subsequent encounter: Secondary | ICD-10-CM

## 2024-05-06 DIAGNOSIS — K219 Gastro-esophageal reflux disease without esophagitis: Secondary | ICD-10-CM

## 2024-05-06 DIAGNOSIS — K625 Hemorrhage of anus and rectum: Secondary | ICD-10-CM

## 2024-05-06 DIAGNOSIS — R1310 Dysphagia, unspecified: Secondary | ICD-10-CM | POA: Diagnosis not present

## 2024-05-06 DIAGNOSIS — Z79899 Other long term (current) drug therapy: Secondary | ICD-10-CM

## 2024-05-06 MED ORDER — NURTEC 75 MG PO TBDP: 1.0000 | ORAL_TABLET | Freq: Every day | ORAL | 11 refills | Status: DC | PRN

## 2024-05-06 MED ORDER — LINACLOTIDE 145 MCG PO CAPS: 290.0000 ug | ORAL_CAPSULE | Freq: Every day | ORAL | Status: DC

## 2024-05-06 MED ORDER — NA SULFATE-K SULFATE-MG SULF 17.5-3.13-1.6 GM/177ML PO SOLN
1.0000 | Freq: Once | ORAL | 0 refills | Status: AC
Start: 2024-05-06 — End: 2024-05-06

## 2024-05-06 MED ORDER — LINACLOTIDE 290 MCG PO CAPS: 290.0000 ug | ORAL_CAPSULE | Freq: Every day | ORAL | 0 refills | Status: DC

## 2024-05-06 NOTE — Telephone Encounter (Signed)
 Beth Huffman. From [facility] department. Received.    There is a seven day turn around for the Appeal.   Fax number [phone]

## 2024-05-06 NOTE — Progress Notes (Signed)
 HPI :  63 year old female here for a follow-up visit for chronic constipation, history of rectal bleeding, GERD/dysphagia.  Recall I last saw her in February of this year.  She has had chronic constipation for many years has had intermittent rectal bleeding passing hard stools.  She is on oxycodone  chronically for treatment of chronic pain associated with syringomyelia and follows with pain management.  She has failed numerous over-the-counter regimens.  Anoscopy previously suggested some hemorrhoids, DRE argued against any dyssynergia.  We placed her on Linzess  72 mcg/day and she escalated 245 mcg daily and she states that has generally been helping with her bowel movements.  She is happy with the regimen and that it does appear to be softening the stools and improving her bowels however she still does require additional over-the-counter laxatives at times to help her move her bowels.  We discussed options.  We did want to pursue colonoscopy at our last visit however we held off awaiting cardiac evaluation for SVT.  She was seen by cardiology in April who has cleared her for her procedures.  Her heart rate appears well-controlled on verapamil .  She states she occasionally has palpitations but generally is under pretty good control.  Recall her last colonoscopy was in 2017 and the prep was inadequate at that time.  Recall she is also had some ongoing dysphagia for some time with solids.  She has also had a history of reflux symptoms that bothered her.  She was taking Protonix  40 mg in the morning and Pepcid  nightly.  She was still having symptoms on the regimen so we increased Protonix  to 40 mg twice daily.  She states that has improved her symptoms 75% and generally doing better on this regimen than previous.  She has some occasional breakthrough but not as common.  Her dysphagia persists.  Recall she has had Cologuard x 2 in recent years that has been negative.  We discussed risks of long-term PPI, no  DEXA on file and she has that scheduled.  Of note on review of her medicines phentermine  is listed although she states she no longer takes that  Prior workup: Colonoscopy 10/22/16: - The perianal and digital rectal examinations were normal. - Stool was found in the ascending colon and in the cecum, making visualization difficult. Dependant portions of the colon had some residual stool which clogged the endoscope. Lavage of the area was performed. No polyps were noted however in some dependant portions, small or flat polyps may not have been appreciated. - A few medium-mouthed diverticula were found in the sigmoid colon. - The exam was otherwise without abnormality on direct and retroflexion views.   Recommended a repeat exam in 1-2 years     Echo 05/03/18: EF 60-65%   Cardiac cath 05/07/2018: Normal coronary arteries Normal left ventricular size and function.  EF 65%   Nuclear stress test 03/31/22: normal., EF 57%    Past Medical History:  Diagnosis Date   Abnormal weight gain    Acute sinusitis 12/05/2008   1/18 8/18   Arachnoid cyst 08/19/2011   Chest tightness or pressure 05/07/2018   Constipation 09/08/2012   Chronic - opioid related 11/17 Amitiza    Contact dermatitis 03/16/2013   5/14 relapsing - poison ivy    Dyspnea 05/07/2018   Elevated liver enzymes 03/04/2011   Chronic off and on    Female stress incontinence 02/06/2009   Qualifier: Diagnosis of  By: Garald MD, Karlynn [name]    FEVER UNSPECIFIED 09/29/2007  Qualifier: Diagnosis of  By: Plotnikov MD, Karlynn [name]    GERD 09/29/2007   Chronic  Protonix  prn  Potential benefits of a long term PPI use as well as potential risks  and complications were explained to the patient and were aknowledged.     GERD (gastroesophageal reflux disease)    Headache 12/20/2007   Dr Gladis Migraines (1 per 2 wks) Topamax  400 mg/d; Imitrex  prn; Ibuprofen  prn, Zofran  prn    Hyperglycemia 08/19/2011   Mild     Hypertension    Increased blood pressure  (not hypertension) 08/23/2014   Intractable migraine 06/14/2009   Chronic Ibuprofen , Imitrex , Topamax ,  Nortriptyline  - intolerant   Knee pain 03/04/2011   R knee 2018 L knee pain - L knee b anserina and L knee is tender w/ROM: L knee pain is worse - patellofemoral syndrome vs other   LBP (low back pain)    Local anesthetic drug adverse reaction 01/21/2022   LOW BACK PAIN 09/29/2007   Chronic, lately (2016) disabling Due to syringomyelia On Oxycodone   Potential benefits of a long term opioids use as well as potential risks (i.e. addiction risk, apnea etc) and complications (i.e. Somnolence, constipation and others) were explained to the patient and were aknowledged.     LUQ PAIN 05/23/2010   Qualifier: Diagnosis of  By: Plotnikov MD, Karlynn [name]    Migraine, chronic, without aura, intractable 10/28/2017   Botox  approved diagnosis. 7/19 Worse. Start Emagility inj. Relpax    Migraines    Nausea & vomiting 08/19/2011   Nausea without vomiting 06/14/2009   Due to migraines     NECK PAIN 06/14/2009    Chronic, lately (2016) disabling Due to syringomyelia On Oxycodone   Potential benefits of a long term opioids use as well as potential risks (i.e. addiction risk, apnea etc) and complications (i.e. Somnolence, constipation and others) were explained to the patient and were aknowledged.     NONSPECIFIC ABNORMAL RESULTS LIVR FUNCTION STUDY 09/28/2009   Qualifier: Diagnosis of  By: Garald MD, Karlynn [name]    OBSESSIVE-COMPULSIVE DISORDER 05/23/2010   Chronic - compulsive shopping    Onychomycosis 01/30/2016   4/17    Orthostatic hypotension 06/30/2013   9/14 likely Rapaflo induced    OTITIS EXTERNA 05/10/2008   Qualifier: Diagnosis of  By: Garald MD, Karlynn [name]    Palpitations 06/21/2014   PARESTHESIA 07/13/2008   Qualifier: Diagnosis of  By: Garald MD, Karlynn [name]    Partial thickness burn of left wrist 09/11/2015   Postoperative state 07/24/2016   Pyelonephritis 2008   Rash 07/27/2017   Stress 2009   Syncope,  near 06/30/2013   9/14 likely Rapaflo induced 12/14 relapsing off Rapaflo x 7 2/15 no relapsing since on 1/2 dose of Topamax     Syringobulbia (HCC)    SYRINGOMYELIA 08/20/2007   Chronic Dr Onita Dr [facility] at [facility] Chronic pain    Syringomyelia (HCC)    Tachycardia    Tonsillitis, chronic 02/26/2017   2018 worse   URINARY TRACT INFECTION (UTI) 03/23/2008   Qualifier: Diagnosis of  By: Garald MD, Karlynn [name]    UTI (lower urinary tract infection)    Well adult exam 12/21/2013   We discussed age appropriate health related issues, including available/recomended screening tests and vaccinations. We discussed a need for adhering to healthy diet and exercise. Labs/EKG were reviewed/ordered. All questions were answered.        Past Surgical History:  Procedure Laterality Date   ABDOMINAL HYSTERECTOMY     ANTERIOR  AND POSTERIOR REPAIR N/A 07/24/2016   Procedure: Possible ANTERIOR (CYSTOCELE) repair, perineoplasty;  Surgeon: Percilla Burly, MD;  Location: [facility];  Service: Gynecology;  Laterality: N/A;   BLADDER SUSPENSION N/A 07/24/2016   Procedure: TRANSVAGINAL TAPE (TVT) PROCEDURE;  Surgeon: Percilla Burly, MD;  Location: [facility];  Service: Gynecology;  Laterality: N/A;   COLONOSCOPY     CYSTOSCOPY N/A 07/24/2016   Procedure: CYSTOSCOPY;  Surgeon: Percilla Burly, MD;  Location: [facility];  Service: Gynecology;  Laterality: N/A;   FOOT SURGERY Right 2023   FOOT SURGERY Right 2023   Scraped top of foot   intracranial decompression surgery  2006   LEFT HEART CATH AND CORONARY ANGIOGRAPHY N/A 05/07/2018   Procedure: LEFT HEART CATH AND CORONARY ANGIOGRAPHY;  Surgeon: Claudene Victory ORN, MD;  Location: [facility];  Service: Cardiovascular;  Laterality: N/A;   PARTIAL KNEE ARTHROPLASTY Left 09/07/2018   Procedure: UNICOMPARTMENTAL LEFT KNEE;  Surgeon: Beverley Evalene BIRCH, MD;  Location: [facility];  Service: Orthopedics;  Laterality: Left;  Adductor Block   ROBOTIC ASSISTED LAPAROSCOPIC  SACROCOLPOPEXY N/A 07/24/2016   Procedure: ROBOTIC ASSISTED LAPAROSCOPIC SACROCOLPOPEXY WITH PERINEOPLASTY;  Surgeon: Percilla Burly, MD;  Location: [facility];  Service: Gynecology;  Laterality: N/A;   TUBAL LIGATION     Family History  Problem Relation Age of Onset   Colon polyps Mother    COPD Mother    Peripheral vascular disease Mother    Hypertension Mother    Heart disease Mother    Hypertension Father    Breast cancer Maternal Aunt    Colon cancer Maternal Uncle 60   Heart attack Maternal Grandmother    Stroke Maternal Grandfather    Stroke Paternal Grandfather    Asthma Granddaughter    Anxiety disorder Other    Esophageal cancer Neg Hx    Pancreatic cancer Neg Hx    Stomach cancer Neg Hx    Social History   Tobacco Use   Smoking status: Never    Passive exposure: Past   Smokeless tobacco: Never  Vaping Use   Vaping status: Never Used  Substance Use Topics   Alcohol use: No   Drug use: No   Current Outpatient Medications  Medication Sig Dispense Refill   botulinum toxin Type A  (BOTOX ) 200 units injection Inject 155 units IM into multiple site in the face,neck and head once every 90 days 1 each 4   cholecalciferol (VITAMIN D ) 1000 UNITS tablet Take 1,000 Units by mouth daily.     cyclobenzaprine  (FLEXERIL ) 5 MG tablet TAKE 1 TABLET THREE TIMES DAILY AS NEEDED FOR MUSCLE SPASMS. 90 tablet 3   Erenumab -aooe (AIMOVIG ) 140 MG/ML SOAJ Inject 140 mg into the skin every 28 (twenty-eight) days. 1.12 mL 5   estradiol  (ESTRACE ) 0.5 MG tablet Take 1 tablet (0.5 mg total) by mouth daily. 90 tablet 0   fluticasone  furoate-vilanterol (BREO ELLIPTA ) 100-25 MCG/ACT AEPB Inhale 1 puff into the lungs daily. 1 each 5   levalbuterol  (XOPENEX  HFA) 45 MCG/ACT inhaler Inhale 2 puffs into the lungs every 6 (six) hours as needed for wheezing or shortness of breath. 1 each 12   linaclotide  (LINZESS ) 290 MCG CAPS capsule Take 1 capsule (290 mcg total) by mouth daily before breakfast. Lot:  [id number], exp 02-2025 12 capsule 0   Multiple Vitamin (MULTIVITAMIN PO) Take 1 tablet by mouth daily.     Na Sulfate-K Sulfate-Mg Sulfate concentrate (SUPREP) 17.5-3.13-1.6 GM/177ML SOLN Take 1 kit (354 mLs total) by mouth once for 1  dose. 354 mL 0   naproxen  (NAPROSYN ) 500 MG tablet TAKE ONE TABLET BY MOUTH TWICE DAILY WITH A MEAL 60 tablet 3   nitroGLYCERIN  (NITROSTAT ) 0.4 MG SL tablet Place 1 tablet (0.4 mg total) under the tongue every 5 (five) minutes as needed for chest pain. 25 tablet 3   ondansetron  (ZOFRAN ) 8 MG tablet TAKE ONE TABLET BY MOUTH EVERY 8 HOURS AS NEEDED FOR NAUSEA OR FOR VOMITING 21 tablet 3   Oxycodone  HCl 10 MG TABS Take 1 tablet (10 mg total) by mouth every 6 (six) hours as needed. 120 tablet 0   Oxycodone  HCl 10 MG TABS Take 1 tablet (10 mg total) by mouth every 6 (six) hours as needed. 120 tablet 0   Oxycodone  HCl 10 MG TABS Take 1 tablet (10 mg total) by mouth every 6 (six) hours as needed. 120 tablet 0   oxymetazoline  (AFRIN NASAL SPRAY) 0.05 % nasal spray Place 1 spray into both nostrils 2 (two) times daily. 30 mL 0   pantoprazole  (PROTONIX ) 40 MG tablet Take 1 tablet (40 mg total) by mouth 2 (two) times daily before a meal. 60 tablet 2   polyethylene glycol (MIRALAX  / GLYCOLAX ) packet Take 8.5 g by mouth every other day.     rizatriptan  (MAXALT ) 10 MG tablet TAKE 1 TABLET AT ONSET OF MIGRAINE- MAY REPEAT ONCE IN 2 HOURS. LIMIT 2/24 HOURS. AVOID DAILY USE. 8 tablet 5   senna (SENOKOT) 8.6 MG tablet Take 1 tablet by mouth as needed for constipation.     verapamil  (CALAN -SR) 120 MG CR tablet TAKE ONE TABLET BY MOUTH AT BEDTIME 90 tablet 1   Rimegepant Sulfate (NURTEC) 75 MG TBDP Take 1 tablet (75 mg total) by mouth daily as needed. 16 tablet 11   No current facility-administered medications for this visit.   Allergies  Allergen Reactions   Atenolol  Other (See Comments)    Wt gain   Cefuroxime Axetil Nausea Only and Other (See Comments)    REACTION: nausea - but  tolerates PCN   Codeine Sulfate Nausea And Vomiting and Nausea Only   Fentanyl  Nausea And Vomiting and Other (See Comments)    REACTION: bad reaction   Imdur  [Isosorbide  Nitrate] Other (See Comments)    Headaches   Nitrofurantoin Other (See Comments)    Unknown   Pregabalin Other (See Comments)    REACTION: cp   Rapaflo [Silodosin] Other (See Comments)    Orthostatic BP drop, near syncope    Topamax  [Topiramate ]     Tight chest    Tramadol Other (See Comments)   Tramadol Hcl Nausea And Vomiting    REACTION: sick   Iodinated Contrast Media Rash     Review of Systems: All systems reviewed and negative except where noted in HPI.   Lab Results  Component Value Date   WBC 4.6 07/06/2023   HGB 13.0 07/06/2023   HCT 40.5 07/06/2023   MCV 91.4 07/06/2023   PLT 223.0 07/06/2023   Lab Results  Component Value Date   NA 140 07/06/2023   CL 104 07/06/2023   K 4.2 07/06/2023   CO2 30 07/06/2023   BUN 11 07/06/2023   CREATININE 0.94 07/06/2023   GFR 64.65 07/06/2023   CALCIUM 9.8 07/06/2023   ALBUMIN 4.2 07/06/2023   GLUCOSE 87 07/06/2023    Lab Results  Component Value Date   ALT 16 07/06/2023   AST 23 07/06/2023   ALKPHOS 69 07/06/2023   BILITOT 0.5 07/06/2023  Physical Exam: BP 138/88   Pulse 90   Ht 5' 5 (1.651 m)   Wt 152 lb (68.9 kg)   SpO2 97%   BMI 25.29 kg/m  Constitutional: Pleasant,well-developed, female in no acute distress. Neurological: Alert and oriented to person place and time. Psychiatric: Normal mood and affect. Behavior is normal.   ASSESSMENT: 63 y.o. female here for assessment of the following  1. Opioid-induced constipation   2. Rectal bleeding   3. Dysphagia, unspecified type   4. Gastroesophageal reflux disease, unspecified whether esophagitis present   5. Long-term current use of proton pump inhibitor therapy    Doing better on higher dose of Linzess  although still having some symptoms that require over-the-counter use of  laxatives.  Recommend trial of higher dosing of Linzess  to 290 mcg/day.  Will give some free samples and if this works better for her we will change her prescription.  She is due for colonoscopy, cleared by cardiology to proceed and we will do that to clarify her bleeding symptoms and ensure no other concerning pathology given her last exam had an inadequate prep.  She will need a double prep for this exam which we discussed and she is agreeable to do that.  Reflux much better controlled on high-dose PPI, now doing better on 40 mg twice daily in regards to reflux symptoms but dysphagia persists.  Perhaps she has a peptic stricture that developed from her reflux.  EGD recommended to evaluate and treat with dilation.  She is agreeable to proceed, following discussion of risks and benefits.  For now we will continue her higher dose Protonix  as it is working well, although long-term want to use the lowest dose of PPI needed to control symptoms.  We did review risks of chronic PPI therapy to include CKD, increased risk for bone fracture, etc.  She is due for bone density testing and recommend she follow-up with DEXA scan testing as ordered by her primary care.  We will keep her on current dosing of Protonix  until her procedure is done and make recommendations moving forward based on that result.   PLAN: - increase Linzess  trial to 290 mcg / day, can give a script if it helps - continue protonix  40mg  BID for now until procedure - discussed long term risks, failed lower dose, over time will try to wean to lower dose if tolerated.  Await EGD - follow-up for DEXA scan ordered by PCP - schedule EGD and colon[facility] at the [facility] - cleared by cardiology. Double prep for colonoscopy - of note she is now off phentermine  - will take off her medication list  Marcey Naval, MD [facility]

## 2024-05-06 NOTE — Telephone Encounter (Signed)
 Pt. Cld and wanted extra VV appt outside of Botox  and Follow up about outside concerns than Botox , I xplnd that it would effect her Botox  appt and that I would need to contact Sheena to advise the appropriate action. Pt then said that she didn't need my help and would find another provider. I apologized and said can I please get your nurse so it does not effect your botox  appt, she then said again it's fine I will find another provider.

## 2024-05-06 NOTE — Telephone Encounter (Signed)
 Appeal has been submitted for Aimovig . Will advise when response is received, please be advised that most companies may take 30 days to make a decision. Appeal letter and supporting documentation have been faxed to [phone] on 05/06/2024 @1 :00 pm.  Thank you, Devere Pandy, PharmD Clinical Pharmacist  [place]  Direct Dial: [phone]

## 2024-05-06 NOTE — Patient Instructions (Signed)
 You have been scheduled for an endoscopy and colonoscopy. Please follow the written instructions given to you at your visit today.  If you use inhalers (even only as needed), please bring them with you on the day of your procedure.  DO NOT TAKE 7 DAYS PRIOR TO TEST- Trulicity (dulaglutide) Ozempic, Wegovy (semaglutide) Mounjaro (tirzepatide) Bydureon Bcise (exanatide extended release)  DO NOT TAKE 1 DAY PRIOR TO YOUR TEST Rybelsus (semaglutide) Adlyxin (lixisenatide) Victoza (liraglutide) Byetta (exanatide) ___________________________________________________________________________   Increase your Linzess  to 290 mcg.  Take 30 minutes before a meal.  Let us  know if you would like a prescription.  Continue Protonix  40 mg twice daily.  Thank you for entrusting me with your care and for choosing [facility], Dr. Elspeth Naval    If your blood pressure at your visit was 140/90 or greater, please contact your primary care physician to follow up on this. ______________________________________________________  If you are age 75 or older, your body mass index should be between 23-30. Your Body mass index is 25.29 kg/m. If this is out of the aforementioned range listed, please consider follow up with your Primary Care Provider.  If you are age 33 or younger, your body mass index should be between 19-25. Your Body mass index is 25.29 kg/m. If this is out of the aformentioned range listed, please consider follow up with your Primary Care Provider.  ________________________________________________________  The [facility] providers would like to encourage you to use MYCHART to communicate with providers for non-urgent requests or questions.  Due to long hold times on the telephone, sending your provider a message by [facility] may be a faster and more efficient way to get a response.  Please allow 48 business hours for a response.  Please remember that this is for non-urgent requests.   _______________________________________________________  Due to recent changes in healthcare laws, you may see the results of your imaging and laboratory studies on MyChart before your provider has had a chance to review them.  We understand that in some cases there may be results that are confusing or concerning to you. Not all laboratory results come back in the same time frame and the provider may be waiting for multiple results in order to interpret others.  Please give us  48 hours in order for your provider to thoroughly review all the results before contacting the office for clarification of your results.

## 2024-05-09 ENCOUNTER — Other Ambulatory Visit: Payer: Self-pay | Admitting: Neurology

## 2024-05-10 ENCOUNTER — Other Ambulatory Visit (HOSPITAL_COMMUNITY): Payer: Self-pay

## 2024-05-10 DIAGNOSIS — K080 Exfoliation of teeth due to systemic causes: Secondary | ICD-10-CM | POA: Diagnosis not present

## 2024-05-10 NOTE — Telephone Encounter (Signed)
 Tried calling patient on 05/06/24 and today to see what is going on.  What appt are we trying to schedule.  And to clarify if the patient is leaving the practice.

## 2024-05-12 ENCOUNTER — Telehealth: Payer: Self-pay | Admitting: Pharmacist

## 2024-05-12 ENCOUNTER — Other Ambulatory Visit (HOSPITAL_COMMUNITY): Payer: Self-pay

## 2024-05-12 ENCOUNTER — Telehealth: Payer: Self-pay | Admitting: Neurology

## 2024-05-12 ENCOUNTER — Other Ambulatory Visit

## 2024-05-12 DIAGNOSIS — Z0000 Encounter for general adult medical examination without abnormal findings: Secondary | ICD-10-CM | POA: Diagnosis not present

## 2024-05-12 LAB — LIPID PANEL
Cholesterol: 227 mg/dL — ABNORMAL HIGH (ref 0–200)
HDL: 70.6 mg/dL (ref >39.00)
LDL Cholesterol: 125 mg/dL — ABNORMAL HIGH (ref 0–99)
NonHDL: 156.42
Total CHOL/HDL Ratio: 3
Triglycerides: 155 mg/dL — ABNORMAL HIGH (ref 0.0–149.0)
VLDL: 31 mg/dL (ref 0.0–40.0)

## 2024-05-12 LAB — CBC WITH DIFFERENTIAL/PLATELET
Basophils Absolute: 0.1 K/uL (ref 0.0–0.1)
Basophils Relative: 1.2 % (ref 0.0–3.0)
Eosinophils Absolute: 0.1 K/uL (ref 0.0–0.7)
Eosinophils Relative: 2.2 % (ref 0.0–5.0)
HCT: 36.7 % (ref 36.0–46.0)
Hemoglobin: 12.2 g/dL (ref 12.0–15.0)
Lymphocytes Relative: 35.2 % (ref 12.0–46.0)
Lymphs Abs: 1.9 K/uL (ref 0.7–4.0)
MCHC: 33.4 g/dL (ref 30.0–36.0)
MCV: 92.4 fl (ref 78.0–100.0)
Monocytes Absolute: 0.2 K/uL (ref 0.1–1.0)
Monocytes Relative: 3.9 % (ref 3.0–12.0)
Neutro Abs: 3.1 K/uL (ref 1.4–7.7)
Neutrophils Relative %: 57.5 % (ref 43.0–77.0)
Platelets: 253 K/uL (ref 150.0–400.0)
RBC: 3.98 Mil/uL (ref 3.87–5.11)
RDW: 13.4 % (ref 11.5–15.5)
WBC: 5.5 K/uL (ref 4.0–10.5)

## 2024-05-12 LAB — COMPREHENSIVE METABOLIC PANEL WITH GFR
ALT: 31 U/L (ref 0–35)
AST: 32 U/L (ref 0–37)
Albumin: 4.3 g/dL (ref 3.5–5.2)
Alkaline Phosphatase: 68 U/L (ref 39–117)
BUN: 13 mg/dL (ref 6–23)
CO2: 29 meq/L (ref 19–32)
Calcium: 9.2 mg/dL (ref 8.4–10.5)
Chloride: 98 meq/L (ref 96–112)
Creatinine, Ser: 0.82 mg/dL (ref 0.40–1.20)
GFR: 75.71 mL/min (ref >60.00)
Glucose, Bld: 78 mg/dL (ref 70–99)
Potassium: 3.8 meq/L (ref 3.5–5.1)
Sodium: 134 meq/L — ABNORMAL LOW (ref 135–145)
Total Bilirubin: 0.4 mg/dL (ref 0.2–1.2)
Total Protein: 6.7 g/dL (ref 6.0–8.3)

## 2024-05-12 LAB — URINALYSIS
Bilirubin Urine: NEGATIVE
Hgb urine dipstick: NEGATIVE
Ketones, ur: NEGATIVE
Leukocytes,Ua: NEGATIVE
Nitrite: NEGATIVE
Specific Gravity, Urine: <=1.005 — AB (ref 1.000–1.030)
Total Protein, Urine: NEGATIVE
Urine Glucose: NEGATIVE
Urobilinogen, UA: 0.2 (ref 0.0–1.0)
pH: 6 (ref 5.0–8.0)

## 2024-05-12 LAB — TSH: TSH: 1.59 u[IU]/mL (ref 0.35–5.50)

## 2024-05-12 NOTE — Telephone Encounter (Signed)
 [employer] cld again for the below message  Left a message with the after hour service on 05-11-24    Caller states that she is from [facility]  calling the patients appeal and they have not received any clinical information. It is needed to review the appeal    [employer] fax [phone] is where to fax for appeal

## 2024-05-12 NOTE — Telephone Encounter (Signed)
 Called and spoke with [employer] claims and [employer] department regarding Botox , Nurtec, and Aimovig . Call reference# [id number]):  Nurtec PA initiated by patient on 5.21.25, Denied on 5.23.25 due to lack of information. PA team didn't submit PA, therefore we didn't recieve a denial letter. PA submitted via fax with chart notes on 5.28.25 and was administratively denied due to previous denial on 5.23.25. Received denial letter on 5.29.25. Pt initiates appeal on 7.16.25 ([id number]). Additional information needed per the after hours calls message. PA team Appeal RPh would like clarification on if this medication is suppose to be a preventative in addition to the Botox  and Aimovig . Please provide clinical rational to support preventative.   Aimovig  PA initiated by patient on 5.21.25, Denied on 5.23.25 due to lack of information. PA team didn't submit PA, therefore we didn't recieve a denial letter. PA submitted via fax with chart notes on 5.28.25 and was administratively denied due to previous denial on 5.23.25. Received denial letter on 5.29.25. Appeal submitted by RPh on 7.11.25 (R#[id number] still pending). Pt initiates appeal on 7.16.25 ([id number]).   Botox  PA through pharmacy benefits was initiated by PA team on 4.7.25. Denied on 4.9.25, but approved through medical benefits. Nothing else needed.

## 2024-05-12 NOTE — Telephone Encounter (Signed)
 Fax received from [employer] this letter is in response to appeal filed 05/06/24, fax recevied from [facility] 7/17 5:28 am Auth# [id number] approval listed for medication Aimovig  140 mg good until 05/11/25.

## 2024-05-12 NOTE — Telephone Encounter (Signed)
Tried calling patient, no answer. LMOVM to call the office back.  

## 2024-05-12 NOTE — Telephone Encounter (Signed)
 Left a message with the after hour service on 05-11-24   Caller states that she is from [facility]  calling the patients appeal and they have not received any clinical information. It is needed to review the appeal   [employer] fax [phone] is where to fax for appeal

## 2024-05-12 NOTE — Telephone Encounter (Signed)
 Appeal has been submitted for NURTEC. The request was for an EXPEDITED REVIEW but that falls at the discretion of the insurance company.  Will advise when response is received. Appeal letter and supporting documentation have been faxed to [phone] on 05/12/2024 @3 :52 pm.  Thank you, Devere Pandy, PharmD Clinical Pharmacist  [place]  Direct Dial: [phone]

## 2024-05-13 ENCOUNTER — Encounter: Payer: Self-pay | Admitting: Internal Medicine

## 2024-05-13 NOTE — Telephone Encounter (Signed)
See PA encounter

## 2024-05-16 ENCOUNTER — Telehealth: Payer: Self-pay | Admitting: Pharmacist

## 2024-05-16 NOTE — Telephone Encounter (Signed)
 Encounter on 05-16-2024 has further information. Confirmation from first fax noted and it was re-faxed as well.

## 2024-05-16 NOTE — Telephone Encounter (Signed)
 Received a message that appeal sent on 05/12/24 was not received, below is the confirmation stating it was successfully sent to [id number]    Re-faxed the urgent appeal request today-will upload fax confirmation when received.

## 2024-05-16 NOTE — Telephone Encounter (Signed)
 Fax confirmation

## 2024-05-17 ENCOUNTER — Telehealth: Payer: Self-pay

## 2024-05-17 NOTE — Telephone Encounter (Signed)
 Caller from [employer] spoke to the AN,  Botox  denied for the member and she filed the appeal bit it was denied because it was filed under B.  Caller wants to know why patient needs the botox  in order to get it approved.

## 2024-05-17 NOTE — Telephone Encounter (Signed)
 Called [employer] part D from voicemail provided [phone]). Talked with Rep Latitia. Stated the PA was approved through medical benefits. Rep stated that this medication must go through medical ([employer] [facility] [place]) not pharmacy benefits. Rep stated she would follow up with pt. The approval has [facility] as the provider, because initially it would have been pharmacy provided through medical. However through recent notes, it was [facility] and the provided must be updated to Dr Skeet. Called [employer] [phone]) and after a 41 minute hold, was provided a different number for Care Management [phone]) to call to update the PA to Dr Skeet.

## 2024-05-19 ENCOUNTER — Other Ambulatory Visit (HOSPITAL_COMMUNITY): Payer: Self-pay

## 2024-05-19 NOTE — Telephone Encounter (Signed)
 Called place again to Care Management team. They are forwarding the request to change the provider on the approval letter to Dr Skeet, from [facility] since this is [place] and [place]. After change, back billing should be able to submitted for the 6.20.25 procedure date. Rep Morna said I should receive a call and/or fax with the update.

## 2024-05-20 ENCOUNTER — Telehealth: Payer: Self-pay

## 2024-05-20 ENCOUNTER — Ambulatory Visit: Admitting: Neurology

## 2024-05-20 ENCOUNTER — Other Ambulatory Visit (HOSPITAL_COMMUNITY): Payer: Self-pay

## 2024-05-20 NOTE — Telephone Encounter (Signed)
*  Pulm  Pharmacy Patient Advocate Encounter   Received notification from CoverMyMeds that prior authorization for Levalbuterol  Tartrate 45MCG/ACT aerosol  is required/requested.   Insurance verification completed.   The patient is insured through [facility] .   Per test claim:   VENTOLIN  HFA & ALBUTEROL  6.7/8.5G HFA(GENERIC PROAIR /GENERIC PROVENTIL  HFA)PREFERRED  is preferred by the insurance.  If suggested medication is appropriate, Please send in a new RX and discontinue this one. If not, please advise as to why it's not appropriate so that we may request a Prior Authorization. Please note, some preferred medications may still require a PA.  If the suggested medications have not been trialed and there are no contraindications to their use, the PA will not be submitted, as it will not be approved.   CMM Key: [id number]

## 2024-05-25 NOTE — Telephone Encounter (Signed)
 Good afternoon, Per Dr. Correne office note from 02/03/2024:  Given atrial tachycardia will stop albuterol  and switch to xopanex.   Please provide this information for the PA.  Thank you.

## 2024-05-25 NOTE — Telephone Encounter (Signed)
 Submitted with additional information and pending determination.

## 2024-05-26 ENCOUNTER — Telehealth: Payer: Self-pay

## 2024-05-26 NOTE — Telephone Encounter (Signed)
 Copied from CRM [id number]. Topic: Clinical - Prescription Issue >> May 26, 2024  8:48 AM Celestine FALCON wrote: Reason for CRM: Jermera from [facility] called and stated that the medication Levalbuterol  Tartrate 45MCG/ACT aerosol was denied. The alternative is Dupixent (dupilumab) HFA. She stated she would be sending a fax to the clinic today with the details of the denial as well as the approval of the alternative medication.

## 2024-05-27 ENCOUNTER — Encounter: Payer: Self-pay | Admitting: Gastroenterology

## 2024-05-27 ENCOUNTER — Ambulatory Visit (INDEPENDENT_AMBULATORY_CARE_PROVIDER_SITE_OTHER): Admitting: Internal Medicine

## 2024-05-27 ENCOUNTER — Other Ambulatory Visit (HOSPITAL_COMMUNITY): Payer: Self-pay

## 2024-05-27 VITALS — HR 77 | Temp 98.2°F | Ht 65.0 in | Wt 149.0 lb

## 2024-05-27 DIAGNOSIS — M5441 Lumbago with sciatica, right side: Secondary | ICD-10-CM

## 2024-05-27 DIAGNOSIS — R002 Palpitations: Secondary | ICD-10-CM | POA: Diagnosis not present

## 2024-05-27 DIAGNOSIS — K219 Gastro-esophageal reflux disease without esophagitis: Secondary | ICD-10-CM

## 2024-05-27 DIAGNOSIS — R748 Abnormal levels of other serum enzymes: Secondary | ICD-10-CM

## 2024-05-27 DIAGNOSIS — M79645 Pain in left finger(s): Secondary | ICD-10-CM | POA: Diagnosis not present

## 2024-05-27 DIAGNOSIS — M5442 Lumbago with sciatica, left side: Secondary | ICD-10-CM

## 2024-05-27 DIAGNOSIS — G8929 Other chronic pain: Secondary | ICD-10-CM

## 2024-05-27 DIAGNOSIS — G441 Vascular headache, not elsewhere classified: Secondary | ICD-10-CM | POA: Diagnosis not present

## 2024-05-27 DIAGNOSIS — E785 Hyperlipidemia, unspecified: Secondary | ICD-10-CM

## 2024-05-27 MED ORDER — OXYCODONE HCL 10 MG PO TABS: 10.0000 mg | ORAL_TABLET | Freq: Four times a day (QID) | ORAL | 0 refills | Status: DC | PRN

## 2024-05-27 MED ORDER — METHYLPREDNISOLONE 4 MG PO TBPK
ORAL_TABLET | ORAL | 0 refills | Status: DC
Start: 2024-05-27 — End: 2024-06-09

## 2024-05-27 NOTE — Assessment & Plan Note (Addendum)
 Dyslipidemia 2023 Coronary calcium CT score of 0.  Diet, exercise weight loss Patient declined statins in the past

## 2024-05-27 NOTE — Progress Notes (Signed)
 Subjective:  Patient ID: Beth Huffman, female    DOB: 03/28/1961  Age: 63 y.o. MRN: [id number]  CC: Medical Management of Chronic Issues (Pt states she has been having leg cramps )   HPI Beth Huffman presents for chronic pain, OA, constipation She is complaining of left third finger pain after gardening  Outpatient Medications Prior to Visit  Medication Sig Dispense Refill   botulinum toxin Type A  (BOTOX ) 200 units injection Inject 155 units IM into multiple site in the face,neck and head once every 90 days 1 each 4   cholecalciferol (VITAMIN D ) 1000 UNITS tablet Take 1,000 Units by mouth daily.     cyclobenzaprine  (FLEXERIL ) 5 MG tablet TAKE 1 TABLET THREE TIMES DAILY AS NEEDED FOR MUSCLE SPASMS. 90 tablet 3   Erenumab -aooe (AIMOVIG ) 140 MG/ML SOAJ Inject 140 mg into the skin every 28 (twenty-eight) days. 1.12 mL 5   fluticasone  furoate-vilanterol (BREO ELLIPTA ) 100-25 MCG/ACT AEPB Inhale 1 puff into the lungs daily. 1 each 5   levalbuterol  (XOPENEX  HFA) 45 MCG/ACT inhaler Inhale 2 puffs into the lungs every 6 (six) hours as needed for wheezing or shortness of breath. 1 each 12   linaclotide  (LINZESS ) 290 MCG CAPS capsule Take 1 capsule (290 mcg total) by mouth daily before breakfast. Lot: [id number], exp 02-2025 12 capsule 0   Multiple Vitamin (MULTIVITAMIN PO) Take 1 tablet by mouth daily.     naproxen  (NAPROSYN ) 500 MG tablet TAKE ONE TABLET BY MOUTH TWICE DAILY WITH A MEAL 60 tablet 3   nitroGLYCERIN  (NITROSTAT ) 0.4 MG SL tablet Place 1 tablet (0.4 mg total) under the tongue every 5 (five) minutes as needed for chest pain. 25 tablet 3   ondansetron  (ZOFRAN ) 8 MG tablet TAKE ONE TABLET BY MOUTH EVERY 8 HOURS AS NEEDED FOR NAUSEA OR FOR VOMITING 21 tablet 3   Oxycodone  HCl 10 MG TABS Take 1 tablet (10 mg total) by mouth every 6 (six) hours as needed. 120 tablet 0   Oxycodone  HCl 10 MG TABS Take 1 tablet (10 mg total) by mouth every 6 (six) hours as needed. 120 tablet 0    oxymetazoline  (AFRIN NASAL SPRAY) 0.05 % nasal spray Place 1 spray into both nostrils 2 (two) times daily. 30 mL 0   pantoprazole  (PROTONIX ) 40 MG tablet Take 1 tablet (40 mg total) by mouth 2 (two) times daily before a meal. 60 tablet 2   polyethylene glycol (MIRALAX  / GLYCOLAX ) packet Take 8.5 g by mouth every other day.     Rimegepant Sulfate (NURTEC) 75 MG TBDP Take 1 tablet (75 mg total) by mouth daily as needed. 16 tablet 11   rizatriptan  (MAXALT ) 10 MG tablet TAKE 1 TABLET AT ONSET OF MIGRAINE- MAY REPEAT ONCE IN 2 HOURS. LIMIT 2/24 HOURS. AVOID DAILY USE. 8 tablet 5   senna (SENOKOT) 8.6 MG tablet Take 1 tablet by mouth as needed for constipation.     verapamil  (CALAN -SR) 120 MG CR tablet TAKE ONE TABLET BY MOUTH AT BEDTIME 90 tablet 1   estradiol  (ESTRACE ) 0.5 MG tablet Take 1 tablet (0.5 mg total) by mouth daily. 90 tablet 0   Oxycodone  HCl 10 MG TABS Take 1 tablet (10 mg total) by mouth every 6 (six) hours as needed. 120 tablet 0   No facility-administered medications prior to visit.    ROS: Review of Systems  Constitutional:  Negative for activity change, appetite change, chills, fatigue and unexpected weight change.  HENT:  Negative for congestion, mouth sores  and sinus pressure.   Eyes:  Negative for visual disturbance.  Respiratory:  Negative for cough and chest tightness.   Gastrointestinal:  Negative for abdominal pain and nausea.  Genitourinary:  Negative for difficulty urinating, frequency and vaginal pain.  Musculoskeletal:  Negative for back pain and gait problem.  Skin:  Negative for pallor and rash.  Neurological:  Negative for dizziness, tremors, weakness, numbness and headaches.  Psychiatric/Behavioral:  Negative for confusion and sleep disturbance.     Objective:  Pulse 77   Temp 98.2 F (36.8 C) (Oral)   Ht 5' 5 (1.651 m)   Wt 149 lb (67.6 kg)   SpO2 98%   BMI 24.79 kg/m   BP Readings from Last 3 Encounters:  05/06/24 138/88  04/05/24 122/70   02/29/24 124/76    Wt Readings from Last 3 Encounters:  05/27/24 149 lb (67.6 kg)  05/06/24 152 lb (68.9 kg)  04/05/24 151 lb (68.5 kg)    Physical Exam Constitutional:      General: She is not in acute distress.    Appearance: Normal appearance. She is well-developed.  HENT:     Head: Normocephalic.     Right Ear: External ear normal.     Left Ear: External ear normal.     Nose: Nose normal.  Eyes:     General:        Right eye: No discharge.        Left eye: No discharge.     Conjunctiva/sclera: Conjunctivae normal.     Pupils: Pupils are equal, round, and reactive to light.  Neck:     Thyroid : No thyromegaly.     Vascular: No JVD.     Trachea: No tracheal deviation.  Cardiovascular:     Rate and Rhythm: Normal rate and regular rhythm.     Heart sounds: Normal heart sounds.  Pulmonary:     Effort: No respiratory distress.     Breath sounds: No stridor. No wheezing.  Abdominal:     General: Bowel sounds are normal. There is no distension.     Palpations: Abdomen is soft. There is no mass.     Tenderness: There is no abdominal tenderness. There is no guarding or rebound.  Musculoskeletal:        General: No tenderness.     Cervical back: Normal range of motion and neck supple. No rigidity.     Right lower leg: No edema.     Left lower leg: No edema.  Lymphadenopathy:     Cervical: No cervical adenopathy.  Skin:    Findings: No erythema or rash.  Neurological:     Mental Status: She is oriented to person, place, and time.     Cranial Nerves: No cranial nerve deficit.     Motor: No abnormal muscle tone.     Coordination: Coordination normal.     Deep Tendon Reflexes: Reflexes normal.  Psychiatric:        Behavior: Behavior normal.        Thought Content: Thought content normal.        Judgment: Judgment normal.    L 3d finger PIP is swollen, tender with range of motion.  No redness   Lab Results  Component Value Date   WBC 5.5 05/12/2024   HGB 12.2  05/12/2024   HCT 36.7 05/12/2024   PLT 253.0 05/12/2024   GLUCOSE 78 05/12/2024   CHOL 227 (H) 05/12/2024   TRIG 155.0 (H) 05/12/2024   HDL 70.60 05/12/2024  LDLDIRECT 130.2 12/21/2013   LDLCALC 125 (H) 05/12/2024   ALT 31 05/12/2024   AST 32 05/12/2024   NA 134 (L) 05/12/2024   K 3.8 05/12/2024   CL 98 05/12/2024   CREATININE 0.82 05/12/2024   BUN 13 05/12/2024   CO2 29 05/12/2024   TSH 1.59 05/12/2024   HGBA1C 5.5 03/31/2018    US  LIMITED ULTRASOUND INCLUDING AXILLA LEFT BREAST  Result Date: 05/02/2024 CLINICAL DATA:  LEFT breast mass callback EXAM: ULTRASOUND OF THE LEFT BREAST COMPARISON:  Previous exam(s). FINDINGS: On physical exam, no suspicious mass is appreciated Targeted ultrasound was performed of the LEFT outer breast. At 3 o'clock 3 cm from the nipple, there is an oval circumscribed anechoic mass with posterior acoustic enhancement. It measures 9 x 4 x 8 mm and is consistent with a benign simple cyst. This corresponds to the site of mammographic concern. IMPRESSION: There is a benign cyst at the site of mammographic concern. RECOMMENDATION: Screening mammogram in one year.(Code:[id number]-B-01Y) I have discussed the findings and recommendations with the patient. If applicable, a reminder letter will be sent to the patient regarding the next appointment. BI-RADS CATEGORY  2: Benign. Electronically Signed   By: Corean Salter M.D.   On: 05/02/2024 10:58    Assessment & Plan:   Problem List Items Addressed This Visit     Dyslipidemia   Dyslipidemia 2023 Coronary calcium CT score of 0.  Diet, exercise weight loss Patient declined statins in the past      Elevated liver enzymes   US  if not normalizes  Repeat labs D/c Exedrin       Finger pain, left   New L 3d finger PIP swelling/pain due to overuse underlying osteoarthritis Prednisone  taper, topical creams Finger splint      GERD - Primary   Continue on Protonix  po      Headache   Migraines 10-15/month -  worse Maxalt , Ibuprofen  prn, Zofran  prn Re-start Amovig      Relevant Medications   Oxycodone  HCl 10 MG TABS   LOW BACK PAIN    Due to syringomyelia On Oxycodone   Potential benefits of a long term opioids use as well as potential risks (i.e. addiction risk, apnea etc) and complications (i.e. Somnolence, constipation and others) were explained to the patient and were aknowledged.      Relevant Medications   methylPREDNISolone  (MEDROL  DOSEPAK) 4 MG TBPK tablet   Oxycodone  HCl 10 MG TABS   Palpitations   F/u Dr Fernande         Meds ordered this encounter  Medications   methylPREDNISolone  (MEDROL  DOSEPAK) 4 MG TBPK tablet    Sig: As directed    Dispense:  21 tablet    Refill:  0   Oxycodone  HCl 10 MG TABS    Sig: Take 1 tablet (10 mg total) by mouth every 6 (six) hours as needed.    Dispense:  120 tablet    Refill:  0    Please fill on or after 07/04/2024      Follow-up: Return in about 1 year (around 05/27/2025) for a follow-up visit.  Marolyn Noel, MD

## 2024-05-27 NOTE — Assessment & Plan Note (Signed)
F/u Dr Caryl Comes

## 2024-05-27 NOTE — Assessment & Plan Note (Signed)
[place] if not normalizes  Repeat labs D/c Exedrin

## 2024-05-27 NOTE — Assessment & Plan Note (Signed)
 Continue on Protonix  po

## 2024-05-27 NOTE — Assessment & Plan Note (Signed)
 Migraines 10-15/month - worse Maxalt, Ibuprofen prn, Zofran prn Re-start Amovig

## 2024-05-27 NOTE — Assessment & Plan Note (Signed)
Due to syringomyelia On Oxycodone  Potential benefits of a long term opioids use as well as potential risks (i.e. addiction risk, apnea etc) and complications (i.e. Somnolence, constipation and others) were explained to the patient and were aknowledged. 

## 2024-05-30 ENCOUNTER — Other Ambulatory Visit (HOSPITAL_COMMUNITY): Payer: Self-pay

## 2024-05-30 NOTE — Telephone Encounter (Signed)
 Brand Xopenex  covered. Message sent to office in other encounter.

## 2024-05-30 NOTE — Telephone Encounter (Signed)
 Brand Xopenex  HFA is covered as stated on denial letter for Levalbuterol  HFA.

## 2024-06-01 ENCOUNTER — Telehealth: Payer: Self-pay

## 2024-06-01 NOTE — Telephone Encounter (Signed)
 Lm okay per DPR - Rx was placed along with 12 RF back in April     NFN-

## 2024-06-01 NOTE — Telephone Encounter (Signed)
 Copied from CRM [id number]. Topic: General - Call Back - No Documentation >> Jun 01, 2024  9:48 AM Leila C wrote: Reason for CRM: Patient [phone] is returning the office call, unsure of the call. Patient asked if it's regarding an appointment needed? Informed patient was last seen with Dr. Desain 02/03/24 and advised to follow up in a year. Per CAL, unable to contact CMA. Please advise and call back.    ATC X1. LMTCB. Pt was returning call from [place], [place] regarding a PA for Xopenex . Per encounter on 05-26-24. Pt was given 12 refills from the rx that was sent in April 2025.

## 2024-06-03 NOTE — Telephone Encounter (Signed)
 Message was left to notify per DPR of rx status on 05/26/2024 Duplicate

## 2024-06-04 ENCOUNTER — Other Ambulatory Visit: Payer: Self-pay | Admitting: Internal Medicine

## 2024-06-05 DIAGNOSIS — M79645 Pain in left finger(s): Secondary | ICD-10-CM | POA: Insufficient documentation

## 2024-06-05 NOTE — Assessment & Plan Note (Signed)
 New L 3d finger PIP swelling/pain due to overuse underlying osteoarthritis Prednisone  taper, topical creams Finger splint

## 2024-06-06 ENCOUNTER — Telehealth: Payer: Self-pay

## 2024-06-06 NOTE — Telephone Encounter (Signed)
 Copied from CRM [id number]. Topic: Clinical - Prescription Issue >> May 26, 2024  8:48 AM Celestine FALCON wrote: Reason for CRM: Jermera from [facility] called and stated that the medication Levalbuterol  Tartrate 45MCG/ACT aerosol was denied. The alternative is Dupixent (dupilumab) HFA. She stated she would be sending a fax to the clinic today with the details of the denial as well as the approval of the alternative medication. >> Jun 06, 2024  8:28 AM Corean SAUNDERS wrote: Thersia with [employer] is requesting a call back regarding the patient appeal, she needs the 3 alternative medications that the patient has tried and needs to finish this case today. Please call Thersia back at [phone]

## 2024-06-06 NOTE — Telephone Encounter (Signed)
 Absolutely unlikely to be Dupixent HFA as an alternative. Routing to PA team for official denial reasoning

## 2024-06-06 NOTE — Telephone Encounter (Signed)
 Sending a message to RX team to advise

## 2024-06-07 ENCOUNTER — Telehealth: Payer: Self-pay | Admitting: Internal Medicine

## 2024-06-07 ENCOUNTER — Other Ambulatory Visit: Payer: Self-pay | Admitting: Internal Medicine

## 2024-06-07 NOTE — Telephone Encounter (Signed)
 Copied from CRM #[id number]. Topic: General - Other >> Jun 07, 2024 10:03 AM Shona S wrote: Reason for CRM: [employer] is calling in regards to a medication claim that needs to be submitted, please contact [employer], this has been going on since 05/26/24, representative mentioned she was supposed to receive a call yesterday and still no answer. There is a message chain in the patient chart, please call [employer]. Phone [phone] Fax [phone]

## 2024-06-07 NOTE — Telephone Encounter (Signed)
 Alexis from [employer] is calling back cause no one has reached out to her concerning the appeal for a medication . Ms thersia has called a few times and no one has reached back out to her. Calling cal now to see if we can get her to speak with a nurse or someone who can assist with this information.  Ms thersia is saying she needs additional information Levalbuterol  Tartrate 45MCG/ACT aerosol

## 2024-06-07 NOTE — Telephone Encounter (Signed)
 Is patient continuing with Botox  in the office? Would you like me to proceed with submitting a new PA request?

## 2024-06-07 NOTE — Telephone Encounter (Signed)
 Dr Meade, please advise further.

## 2024-06-08 ENCOUNTER — Encounter: Payer: Self-pay | Admitting: Internal Medicine

## 2024-06-08 ENCOUNTER — Telehealth: Payer: Self-pay | Admitting: Internal Medicine

## 2024-06-08 ENCOUNTER — Other Ambulatory Visit: Payer: Self-pay | Admitting: Internal Medicine

## 2024-06-08 NOTE — Telephone Encounter (Signed)
 Alexa w/[employer] calling about an appeal. See last signed encounter. Tsf to Triage.

## 2024-06-08 NOTE — Telephone Encounter (Signed)
 Called the number provided and spoke with Alexa, who stated that she already spoke with someone and there is nothing further needed. Will close encounter.

## 2024-06-08 NOTE — Telephone Encounter (Signed)
 See phone note dated 06/08/24.

## 2024-06-08 NOTE — Telephone Encounter (Signed)
 I spoke to [employer] with [employer]. The Levalbuterol  was denied because it was wanting her to try 3 other medications. Pt has tried Albuterol  HFA and is currently on Breo. Insurance wants to pt to have tried 3 medications before Levalbuterol  is covered. This is a tier 4 medications which may be around a $99 copay. Alexia states she can try and send this over and see if this will go through. I informed Thersia that if it still does not work, then let us  know if there is anything further we can do to help. Alexis verbalized understanding. NFN

## 2024-06-09 NOTE — Telephone Encounter (Signed)
 Please advise as it is not in med list

## 2024-06-10 ENCOUNTER — Telehealth: Payer: Self-pay | Admitting: Pharmacy Technician

## 2024-06-10 NOTE — Telephone Encounter (Signed)
 Pharmacy Patient Advocate Encounter   Received notification from Pt Calls Messages that prior authorization for BOTOX  200 is required/requested.   Insurance verification completed.   The patient is insured through [facility] .   Per test claim: PA required; PA submitted to above mentioned insurance via Fax Key/confirmation #/EOC [id number] Status is pending

## 2024-06-10 NOTE — Telephone Encounter (Signed)
 PA has been submitted, and telephone encounter has been created. Please see telephone encounter dated 8.15.25.  SENT AS EXPEDITED.

## 2024-06-12 ENCOUNTER — Other Ambulatory Visit: Payer: Self-pay | Admitting: Internal Medicine

## 2024-06-12 MED ORDER — METHYLPREDNISOLONE 4 MG PO TBPK: ORAL_TABLET | ORAL | 0 refills | Status: DC

## 2024-06-13 ENCOUNTER — Telehealth: Payer: Self-pay | Admitting: Neurology

## 2024-06-13 NOTE — Telephone Encounter (Signed)
 Amber from [employer] calling to notify of denial for BOTOX  using Part A but may be eligible on Part B- no telephone number was left

## 2024-06-14 ENCOUNTER — Telehealth: Payer: Self-pay | Admitting: Gastroenterology

## 2024-06-14 NOTE — Telephone Encounter (Signed)
Okay sorry to hear this. Thanks for letting me know.

## 2024-06-14 NOTE — Telephone Encounter (Signed)
 Patient called and stated that she has a colon and EGD schedule for tomorrow but she is having heart problems. Patient is requesting to speak to the nurse. Please advise.

## 2024-06-14 NOTE — Telephone Encounter (Signed)
 Patient calls with complaints of tachycardia over the last 3-4 days and states I just dont feel my best. Is currently scheduled for endo/colon 06/15/24 and inquires about changing procedure date for now. Patient has been rescheduled for 07/20/24 procedure and is asked to contact her cardiologist (she is established with Dr Fernande and has previously seen him for tachycardia) today to further discuss her symptoms and any recommendations/interventions possibly needed.  Patient verbalizes understanding and states she will let us  know if anything changes from a cardiology perspective prior to 07/20/24 procedure.

## 2024-06-15 ENCOUNTER — Encounter: Admitting: Gastroenterology

## 2024-06-15 ENCOUNTER — Other Ambulatory Visit (HOSPITAL_COMMUNITY): Payer: Self-pay

## 2024-06-15 NOTE — Telephone Encounter (Signed)
 Pharmacy Patient Advocate Encounter- Injection via Medical Benefit:  Buy/Bill J code: Botox - G9414 CPT code: 35384 Dx Code: G43.709  PA was submitted to [facility] and has been approved through: 8.15.25 - 11.12.25 Authorization# [id number]  Please send prescription to Specialty Pharmacy: BUY AND BILL  Patient IS NOT eligible for Botox - G9414 Copay Card. Copay Card can make patient's cost as little as zero. Copay card will be provided to pharmacy.

## 2024-06-18 NOTE — Progress Notes (Unsigned)
   Cardiology Office Note:    Date:  06/18/2024   ID:  Beth Huffman, DOB Sep 10, 1961, MRN [id number]  PCP:  Beth Karlynn [name], MD  Cardiologist:  None Electrophysiologist:  Elspeth Sage, MD { Click to update primary MD,subspecialty MD or APP then REFRESH:1}    Referring MD: Plotnikov, Karlynn [name], MD   Chief Complaint: ***  History of Present Illness:    Beth Huffman is a 63 y.o. female with a history of ***           EKGs/Labs/Other Studies Reviewed:    The following studies were reviewed today: ***  EKG:  EKG *** ordered today. EKG personally reviewed and demonstrates ***.  Recent Labs: 05/12/2024: ALT 31; BUN 13; Creatinine, Ser 0.82; Hemoglobin 12.2; Platelets 253.0; Potassium 3.8; Sodium 134; TSH 1.59  Recent Lipid Panel    Component Value Date/Time   CHOL 227 (H) 05/12/2024 1054   TRIG 155.0 (H) 05/12/2024 1054   HDL 70.60 05/12/2024 1054   CHOLHDL 3 05/12/2024 1054   VLDL 31.0 05/12/2024 1054   LDLCALC 125 (H) 05/12/2024 1054   LDLDIRECT 130.2 12/21/2013 0900    Physical Exam:    Vital Signs: There were no vitals taken for this visit.    Wt Readings from Last 3 Encounters:  05/27/24 149 lb (67.6 kg)  05/06/24 152 lb (68.9 kg)  04/05/24 151 lb (68.5 kg)     General: 63 y.o. female in no acute distress. HEENT: Normocephalic and atraumatic. Sclera clear.  Neck: Supple. No carotid bruits. No JVD. Heart: *** RRR. Distinct S1 and S2. No murmurs, gallops, or rubs.  Lungs: No increased work of breathing. Clear to ausculation bilaterally. No wheezes, rhonchi, or rales.  Abdomen: Soft, non-distended, and non-tender to palpation.  Extremities: No lower extremity edema.  Radial and distal pedal pulses 2+ and equal bilaterally. Skin: Warm and dry. Neuro: No focal deficits. Psych: Normal affect. Responds appropriately.   Assessment:    No diagnosis found.  Plan:     Disposition: Follow up in ***   Signed, Aline FORBES Door, PA-C  06/18/2024  8:21 AM    [facility]

## 2024-06-19 NOTE — Progress Notes (Unsigned)
 Cardiology Office Note:  .   Date:  06/21/2024  ID:  Beth Huffman, DOB August 17, 1961, MRN [id number] PCP: Garald Karlynn [name], MD  [facility] Providers Cardiologist:  None Electrophysiologist:  Elspeth Sage, MD {  History of Present Illness: .   Beth Huffman is a 63 y.o. female  with PMHx of Atrial tachycardia/palpitations (Zio 11/2023 showed mainly NSR Avg HR 80, ranging 57-132 bpm; rare PAC/PVC; 6 brief NSVT runs with fastest 9 beats at 164 bpm and longest 21s at124 bpm; sx correlated w/ sinus/PAC; no sig arrhythmia), HTN, dyspnea, chest pain (LHC in 2019 showed normal coronary arteries; CCTA in 2023 showed normal coronary arteries CAC score of 0; Myoview  in 03/2022 WNL) who reports to [facility] office for follow up.   Last seen in heartcare EP OV 02/15/2024 by Ozell Passey, PA-C for follow up.  Reported ongoing mild chest pressure that resolves at rest and HA.  Discussed possibility of loop recorder but no documentation to proceed.  Also discussed considering Imdur  in the future if CP continues.  Continue verapamil  120 mg daily.  However, also noted that verapamil  was stopped due to constipation and exacerbating syringomyelia.  No changes were noted on medication list.  She was felt to be at acceptable risk for GI procedures at this time.   Today, reports ongoing and unchanged symptoms including CP, SOB, palpation, and dizziness. Denies any worsening or increase in frequency or intensity.  Noted chest pain occurs only when bending over, described as tightness, 8 out of 10, lasting less than 1 minute, located midsternal with occasional radiation to the neck.  She has not needed to take NTG as needed yet. CP is associated with palpitations (rapid HR) and dizziness/presyncope. Reports SOB with walking long distances and minimal exertion but relieved at rest.  Patient suspects the symptoms could be secondary to syringomyelia and mediastinum cyst.  Denies any edema, orthopnea, PND or  syncope.  Patient is able to walk dog 30 to 40 minutes about 2-3 times per week with husband.  Reports compliance with verapamil  and notes significant improvement in constipation with Linzess . Endorses heart healthy diet with low sodium and cholesterol.  PCP recently discussed elevated LDL of 125 and patient opted to try lifestyle management as well as red yeast rice which she has not started taking it. She is able to walk around the grocery store and complete yard work with chronic stable SOB as above.  ROS: 10 point review of system has been reviewed and considered negative except ones been listed in the HPI.   Studies Reviewed: SABRA   EKG Interpretation Date/Time:  Tuesday June 21 2024 13:33:29 EDT Ventricular Rate:  80 PR Interval:  126 QRS Duration:  94 QT Interval:  366 QTC Calculation: 422 R Axis:   40  Text Interpretation: Normal sinus rhythm Normal ECG When compared with ECG of 21-Dec-2023 08:46, No significant change was found Confirmed by Sheron Hallmark ([id number]) on 06/21/2024 1:41:29 PM   LHC 02/2018  ? Normal coronary arteries ? Normal left ventricular size and function.  EF 65%   Recommendations: ? Per Dr. Sage. ? Discomfort and dyspnea do not appear to be related to coronary disease or left ventricular dysfunction either systolic or diastolic.  ECHO 04/2018 Study Conclusions  - Left ventricle: The cavity size was normal. Systolic function was    normal. The estimated ejection fraction was in the range of 60%    to 65%. Wall motion was normal; there were no regional wall  motion abnormalities. Left ventricular diastolic function    parameters were normal.  - Mitral valve: There is a mobile echobright small density in the    mid LV cavity consistent with redundant chordae tendinae.  - Tricuspid valve: There was trivial regurgitation.   Impressions:  - Normal LV function and diastolic function. Mildly sclerotic AV.    Trivial TR. Echobright oscillating density in mid  LV cavity    consistent with redundant chordae tendinae. This has been noted    on prior echoes.    CCTA 12/2021 IMPRESSION: 1. Coronary calcium score of 0. This was 0 percentile for age-, sex, and race-matched controls. 2.  Normal coronary origin with right dominance. 3.  Normal coronary arteries.   CAD RADS 0. 4.  Consider non atherosclerotic causes of chest pain.  Exercise myoview  03/2022   The study is normal. The study is low risk.   horizontal ST depression (I and II) was noted.   LV perfusion is normal. There is no evidence of ischemia. There is no evidence of infarction.   Left ventricular function is normal. Nuclear stress EF: 57 %. The left ventricular ejection fraction is normal (55-65%). End diastolic cavity size is normal. End systolic cavity size is normal.   Prior study not available for comparison.  Heart Monitor 11/2023 Indication:palpitations with presyncope  Duration: 13.16d   Findings HR  avg 80  Min 57-Max 132  PVCs Rare, less than 1%  PACs Rare, less than 1%  SVT Nonsustained 6 episodes; fastest 164 bpm for 9 beats; longest for 21  secs at 124 bpm    Symptoms: Chest pressure/ irregular heart beats>> sinus            Triggered: sinus             Sinus with PAC             Sinus            Atrial non sustained tach               Conclusions: No significant arrhythmias noted with the symptoms And conversely no significant symptoms reported with the run of SVT  Physical Exam:   VS:  BP 134/86   Pulse 80   Ht 5' 5 (1.651 m)   Wt 149 lb (67.6 kg)   SpO2 95%   BMI 24.79 kg/m    Wt Readings from Last 3 Encounters:  06/21/24 149 lb (67.6 kg)  05/27/24 149 lb (67.6 kg)  05/06/24 152 lb (68.9 kg)    GEN: Well nourished, well developed in no acute distress while sitting in chair.  NECK: No JVD; No carotid bruits CARDIAC: RRR, no murmurs, rubs, gallops RESPIRATORY:  Clear to auscultation without rales, wheezing or rhonchi  ABDOMEN: Soft, non-tender,  non-distended EXTREMITIES:  No edema; No deformity   ASSESSMENT AND PLAN: .   Preoperative Cardiovascular Risk Assessment EGD and Colonoscopy 07/20/2024 [facility]  Fax: [phone]  She was previously evaluated in April and was cleared from cardiac standpoint with no significant changes in symptoms since that time.   Revised Cardiac Risk Index (RCRI) with 0 point and perioperative risk of major cardiac events is 0.5%.  Duke Activity Status Index (DASI) with > 4 mets .  From cardiac standpoint, this patient is at acceptable risk for the planned procedure without further cardiovascular testing.  Atrial tachycardia (HCC) Palpitations Zio 11/2023 showed mainly NSR Avg HR 80, ranging 57-132 bpm; rare PAC/PVC; 6 brief NSVT  runs with fastest 9 beats at 164 bpm and longest 21s at124 bpm; sx correlated w/ sinus/PAC; no sig arrhythmia Reports brief unchanged rapid HR associated with CP episodes. Recommended device to monitor HR.  Patient agrees and plans to get Apple Watch or similar device.  Patient also mention Kardia device. I advised that this device does monitor HR but Apple Watch or similar device would monitor more consistently then having to retrieve Kardia device whenever she has these symptoms.  Primary hypertension Patient does not monitor BP at home.  Recommend omron BP machine.  Discussed BP measurement techniques.  Recommended to monitor BP at home when symptomatic. BP this OV well controlled today: 134/86 Continue on verapamil  120 mg daily. Encourage physical activity for 150 minutes per week and heart healthy low sodium diet. Discussed limiting sodium intake to < 2 grams daily.     Dyspnea on exertion Chest discomfort ECHO 2019 showed 60-65% EF, mobile density c/w redundant chordae tendinae, trivial TVR LHC in 2019 showed normal coronary arteries. Noted that chest discomfort and dyspnea does not appear related to CAD or LV dysfunction.  CCTA in 2023 showed  normal coronary arteries CAC score of 0 Myoview  in 03/2022 WNL Reports chest pain occurs only when bending over, described as tightness, 8 out of 10, lasting less than 1 minute, located midsternal with occasional radiation to the neck. Reports SOB with walking long distances and minimal exertion but relieved at rest.  No need for additional ischemic work up given symptoms are chronic, stable and unchanged Would avoid Imdur  with history of headaches. Would avoid Ranexa with history of headaches, dizziness and constipation. Patient suspects the symptoms could be secondary to syringomyelia and mediastinum cyst.    Hyperlipidemia, LDL goal < 100 LDL 125 in 11/7972 checked by PCP. Discussed with PCP that she prefers lifestyle management as well as red yeast rice which she has not started taking it.  Ok to continue with lifestyle management and start red yeast rice. Will plan to repeat lipid panel at next OV with EP.  If LDL remains elevated then would recommend considering statins.    Dispo: Follow up in 3 months with EP   Signed, Lorette CINDERELLA Kapur, PA-C

## 2024-06-21 ENCOUNTER — Ambulatory Visit: Attending: Student | Admitting: Physician Assistant

## 2024-06-21 ENCOUNTER — Encounter: Payer: Self-pay | Admitting: Student

## 2024-06-21 VITALS — BP 134/86 | HR 80 | Ht 65.0 in | Wt 149.0 lb

## 2024-06-21 DIAGNOSIS — R002 Palpitations: Secondary | ICD-10-CM

## 2024-06-21 DIAGNOSIS — Z01810 Encounter for preprocedural cardiovascular examination: Secondary | ICD-10-CM | POA: Diagnosis not present

## 2024-06-21 DIAGNOSIS — R0609 Other forms of dyspnea: Secondary | ICD-10-CM

## 2024-06-21 DIAGNOSIS — I4719 Other supraventricular tachycardia: Secondary | ICD-10-CM

## 2024-06-21 DIAGNOSIS — I10 Essential (primary) hypertension: Secondary | ICD-10-CM | POA: Diagnosis not present

## 2024-06-21 DIAGNOSIS — E785 Hyperlipidemia, unspecified: Secondary | ICD-10-CM

## 2024-06-21 DIAGNOSIS — R0789 Other chest pain: Secondary | ICD-10-CM

## 2024-06-21 NOTE — Patient Instructions (Addendum)
 Medication Instructions:  Purchase apple watch or other heart rate monitor device.  Purchase Omron Blood Pressure machine.  *If you need a refill on your cardiac medications before your next appointment, please call your pharmacy*   Lab Work: No labs were ordered during today's visit.  If you have labs (blood work) drawn today and your tests are completely normal, you will receive your results only by: MyChart Message (if you have MyChart) OR A paper copy in the mail If you have any lab test that is abnormal or we need to change your treatment, we will call you to review the results.   Testing/Procedures: No procedures were ordered during today's visit.    Follow-Up: At [facility], you and your health needs are our priority.  As part of our continuing mission to provide you with exceptional heart care, we have created designated Provider Care Teams.  These Care Teams include your primary Cardiologist (physician) and Advanced Practice Providers (APPs -  Physician Assistants and Nurse Practitioners) who all work together to provide you with the care you need, when you need it.  We recommend signing up for the patient portal called MyChart.  Sign up information is provided on this After Visit Summary.  MyChart is used to connect with patients for Virtual Visits (Telemedicine).  Patients are able to view lab/test results, encounter notes, upcoming appointments, etc.  Non-urgent messages can be sent to your provider as well.   To learn more about what you can do with MyChart, go to [email or web address].    Your next appointment:   3 month(s)  Provider:   You may see one of the following Advanced Practice Providers on your designated Care Team:   Charlies Arthur, PA-C Michael Andy Tillery, PA-C Suzann Riddle, NP Daphne Barrack, NP    Other Instructions Thank you for choosing [facility]!

## 2024-06-22 ENCOUNTER — Other Ambulatory Visit (HOSPITAL_COMMUNITY): Payer: Self-pay

## 2024-06-22 NOTE — Telephone Encounter (Signed)
 According to the BIV it looks like Botox  is 20% and injection is $0.

## 2024-06-29 ENCOUNTER — Encounter: Payer: Self-pay | Admitting: Internal Medicine

## 2024-07-04 ENCOUNTER — Other Ambulatory Visit: Payer: Self-pay | Admitting: Gastroenterology

## 2024-07-05 ENCOUNTER — Ambulatory Visit: Admitting: Pulmonary Disease

## 2024-07-06 ENCOUNTER — Encounter: Payer: Self-pay | Admitting: Internal Medicine

## 2024-07-06 ENCOUNTER — Ambulatory Visit: Admitting: Internal Medicine

## 2024-07-06 VITALS — BP 126/82 | HR 86 | Temp 98.3°F | Ht 65.0 in | Wt 152.0 lb

## 2024-07-06 DIAGNOSIS — R739 Hyperglycemia, unspecified: Secondary | ICD-10-CM

## 2024-07-06 DIAGNOSIS — M5441 Lumbago with sciatica, right side: Secondary | ICD-10-CM | POA: Diagnosis not present

## 2024-07-06 DIAGNOSIS — G950 Syringomyelia and syringobulbia: Secondary | ICD-10-CM

## 2024-07-06 DIAGNOSIS — R200 Anesthesia of skin: Secondary | ICD-10-CM | POA: Diagnosis not present

## 2024-07-06 DIAGNOSIS — G8929 Other chronic pain: Secondary | ICD-10-CM

## 2024-07-06 DIAGNOSIS — M150 Primary generalized (osteo)arthritis: Secondary | ICD-10-CM

## 2024-07-06 DIAGNOSIS — M5442 Lumbago with sciatica, left side: Secondary | ICD-10-CM | POA: Diagnosis not present

## 2024-07-06 DIAGNOSIS — R449 Unspecified symptoms and signs involving general sensations and perceptions: Secondary | ICD-10-CM

## 2024-07-06 DIAGNOSIS — M1990 Unspecified osteoarthritis, unspecified site: Secondary | ICD-10-CM | POA: Insufficient documentation

## 2024-07-06 MED ORDER — OXYCODONE HCL 10 MG PO TABS: 10.0000 mg | ORAL_TABLET | Freq: Four times a day (QID) | ORAL | 0 refills | Status: DC | PRN

## 2024-07-06 MED ORDER — PREDNISONE 5 MG PO TABS: 5.0000 mg | ORAL_TABLET | Freq: Every day | ORAL | 1 refills | Status: DC

## 2024-07-06 NOTE — Progress Notes (Unsigned)
 Subjective:  Patient ID: Beth Huffman, female    DOB: [birth year]  Age: 63 y.o. MRN: [id number]  CC: Follow-up (Patients states use of prednisone  helped arthritis. )   HPI Beth Huffman presents for severe hands OA, chronic pain, GERD C/o pelvic, groin, vaginal, rectal, upper legs, lower abdomen numbness x several years.  She did see a urologist and has an appointment to see a gastroenterologist.  She has not discussed it with Dr. Skeet yet.   Outpatient Medications Prior to Visit  Medication Sig Dispense Refill   botulinum toxin Type A  (BOTOX ) 200 units injection Inject 155 units IM into multiple site in the face,neck and head once every 90 days 1 each 4   cholecalciferol (VITAMIN D ) 1000 UNITS tablet Take 1,000 Units by mouth daily.     cyclobenzaprine  (FLEXERIL ) 5 MG tablet TAKE 1 TABLET THREE TIMES DAILY AS NEEDED FOR MUSCLE SPASMS. 90 tablet 3   Erenumab -aooe (AIMOVIG ) 140 MG/ML SOAJ Inject 140 mg into the skin every 28 (twenty-eight) days. 1.12 mL 5   estradiol  (ESTRACE ) 0.5 MG tablet Take 1 tablet (0.5 mg total) by mouth daily. 90 tablet 1   fluticasone  furoate-vilanterol (BREO ELLIPTA ) 100-25 MCG/ACT AEPB Inhale 1 puff into the lungs daily. 1 each 5   levalbuterol  (XOPENEX  HFA) 45 MCG/ACT inhaler Inhale 2 puffs into the lungs every 6 (six) hours as needed for wheezing or shortness of breath. 1 each 12   Multiple Vitamin (MULTIVITAMIN PO) Take 1 tablet by mouth daily.     naproxen  (NAPROSYN ) 500 MG tablet TAKE ONE TABLET BY MOUTH TWICE DAILY WITH A MEAL 60 tablet 3   nitroGLYCERIN  (NITROSTAT ) 0.4 MG SL tablet Place 1 tablet (0.4 mg total) under the tongue every 5 (five) minutes as needed for chest pain. 25 tablet 3   ondansetron  (ZOFRAN ) 8 MG tablet TAKE ONE TABLET BY MOUTH EVERY 8 HOURS AS NEEDED FOR NAUSEA OR FOR VOMITING 21 tablet 3   oxymetazoline  (AFRIN NASAL SPRAY) 0.05 % nasal spray Place 1 spray into both nostrils 2 (two) times daily. 30 mL 0   pantoprazole  (PROTONIX )  40 MG tablet Take 1 tablet (40 mg total) by mouth 2 (two) times daily before a meal. 60 tablet 2   polyethylene glycol (MIRALAX  / GLYCOLAX ) packet Take 8.5 g by mouth every other day.     Rimegepant Sulfate (NURTEC) 75 MG TBDP Take 1 tablet (75 mg total) by mouth daily as needed. 16 tablet 11   rizatriptan  (MAXALT ) 10 MG tablet TAKE 1 TABLET AT ONSET OF MIGRAINE- MAY REPEAT ONCE IN 2 HOURS. LIMIT 2/24 HOURS. AVOID DAILY USE. 8 tablet 5   senna (SENOKOT) 8.6 MG tablet Take 1 tablet by mouth as needed for constipation.     verapamil  (CALAN -SR) 120 MG CR tablet TAKE ONE TABLET BY MOUTH AT BEDTIME 90 tablet 1   linaclotide  (LINZESS ) 290 MCG CAPS capsule Take 1 capsule (290 mcg total) by mouth daily before breakfast. Lot: [id number], exp 02-2025 12 capsule 0   methylPREDNISolone  (MEDROL  DOSEPAK) 4 MG TBPK tablet TAKE AS DIRECTED 21 tablet 0   methylPREDNISolone  (MEDROL  DOSEPAK) 4 MG TBPK tablet 6 day dose pack - take as directed 21 tablet 0   Oxycodone  HCl 10 MG TABS Take 1 tablet (10 mg total) by mouth every 6 (six) hours as needed. 120 tablet 0   Oxycodone  HCl 10 MG TABS Take 1 tablet (10 mg total) by mouth every 6 (six) hours as needed. 120 tablet 0  Oxycodone  HCl 10 MG TABS Take 1 tablet (10 mg total) by mouth every 6 (six) hours as needed. 120 tablet 0   No facility-administered medications prior to visit.    ROS: Review of Systems  Constitutional:  Positive for fatigue. Negative for activity change, appetite change, chills and unexpected weight change.  HENT:  Negative for congestion, mouth sores and sinus pressure.   Eyes:  Negative for visual disturbance.  Respiratory:  Negative for cough and chest tightness.   Cardiovascular:  Negative for leg swelling.  Gastrointestinal:  Negative for abdominal pain and nausea.  Genitourinary:  Negative for difficulty urinating, frequency and vaginal pain.  Musculoskeletal:  Positive for arthralgias, back pain, gait problem and neck stiffness.  Skin:   Negative for pallor and rash.  Neurological:  Positive for numbness. Negative for dizziness, tremors, weakness and headaches.  Hematological:  Bruises/bleeds easily.  Psychiatric/Behavioral:  Negative for confusion, sleep disturbance and suicidal ideas. The patient is not nervous/anxious.     Objective:  BP 126/82   Pulse 86   Temp 98.3 F (36.8 C) (Oral)   Ht 5' 5 (1.651 m)   Wt 152 lb (68.9 kg)   SpO2 95%   BMI 25.29 kg/m   BP Readings from Last 3 Encounters:  07/06/24 126/82  06/21/24 134/86  05/06/24 138/88    Wt Readings from Last 3 Encounters:  07/06/24 152 lb (68.9 kg)  06/21/24 149 lb (67.6 kg)  05/27/24 149 lb (67.6 kg)    Physical Exam Constitutional:      General: She is not in acute distress.    Appearance: Normal appearance. She is well-developed.  HENT:     Head: Normocephalic.     Right Ear: External ear normal.     Left Ear: External ear normal.     Nose: Nose normal.  Eyes:     General:        Right eye: No discharge.        Left eye: No discharge.     Conjunctiva/sclera: Conjunctivae normal.     Pupils: Pupils are equal, round, and reactive to light.  Neck:     Thyroid : No thyromegaly.     Vascular: No JVD.     Trachea: No tracheal deviation.  Cardiovascular:     Rate and Rhythm: Normal rate and regular rhythm.     Heart sounds: Normal heart sounds.  Pulmonary:     Effort: No respiratory distress.     Breath sounds: No stridor. No wheezing.  Abdominal:     General: Bowel sounds are normal. There is no distension.     Palpations: Abdomen is soft. There is no mass.     Tenderness: There is no abdominal tenderness. There is no guarding or rebound.  Musculoskeletal:        General: Tenderness present.     Cervical back: Normal range of motion and neck supple. No rigidity.     Right lower leg: No edema.     Left lower leg: No edema.  Lymphadenopathy:     Cervical: No cervical adenopathy.  Skin:    Findings: No bruising, erythema or rash.   Neurological:     Mental Status: She is oriented to person, place, and time.     Cranial Nerves: No cranial nerve deficit.     Sensory: Sensory deficit present.     Motor: No abnormal muscle tone.     Coordination: Coordination normal.     Deep Tendon Reflexes: Reflexes normal.  Psychiatric:  Behavior: Behavior normal.        Thought Content: Thought content normal.        Judgment: Judgment normal.   Decrease sensation to pain, touch between the upper chest and proximal thighs  Lab Results  Component Value Date   WBC 5.5 05/12/2024   HGB 12.2 05/12/2024   HCT 36.7 05/12/2024   PLT 253.0 05/12/2024   GLUCOSE 78 05/12/2024   CHOL 227 (H) 05/12/2024   TRIG 155.0 (H) 05/12/2024   HDL 70.60 05/12/2024   LDLDIRECT 130.2 12/21/2013   LDLCALC 125 (H) 05/12/2024   ALT 31 05/12/2024   AST 32 05/12/2024   NA 134 (L) 05/12/2024   K 3.8 05/12/2024   CL 98 05/12/2024   CREATININE 0.82 05/12/2024   BUN 13 05/12/2024   CO2 29 05/12/2024   TSH 1.59 05/12/2024   HGBA1C 5.5 03/31/2018    US  LIMITED ULTRASOUND INCLUDING AXILLA LEFT BREAST  Result Date: 05/02/2024 CLINICAL DATA:  LEFT breast mass callback EXAM: ULTRASOUND OF THE LEFT BREAST COMPARISON:  Previous exam(s). FINDINGS: On physical exam, no suspicious mass is appreciated Targeted ultrasound was performed of the LEFT outer breast. At 3 o'clock 3 cm from the nipple, there is an oval circumscribed anechoic mass with posterior acoustic enhancement. It measures 9 x 4 x 8 mm and is consistent with a benign simple cyst. This corresponds to the site of mammographic concern. IMPRESSION: There is a benign cyst at the site of mammographic concern. RECOMMENDATION: Screening mammogram in one year.(Code:SM-B-01Y) I have discussed the findings and recommendations with the patient. If applicable, a reminder letter will be sent to the patient regarding the next appointment. BI-RADS CATEGORY  2: Benign. Electronically Signed   By: Corean Salter M.D.   On: 05/02/2024 10:58    Assessment & Plan:   Problem List Items Addressed This Visit     Anesthesia in both legs   Chronic pelvic, groin, vaginal, rectal, upper legs, lower abdomen numbness x several years.  She did see a urologist and has an appointment to see a gastroenterologist.  She has not discussed it with Dr. Skeet yet.  The symptoms are likely related to syringomyelia with central cord syndrome, conus medullaris syndrome Primarily pelvis anesthesia - chronic: discussed w./Dr Skeet at the next OV that is coming up soon.      Hyperglycemia   Pt lost wt Cont w/Phentermine  Monitor CBG      LOW BACK PAIN    Due to syringomyelia On Oxycodone   Potential benefits of a long term opioids use as well as potential risks (i.e. addiction risk, apnea etc) and complications (i.e. Somnolence, constipation and others) were explained to the patient and were aknowledged.      Relevant Medications   predniSONE  (DELTASONE ) 5 MG tablet   Oxycodone  HCl 10 MG TABS   Oxycodone  HCl 10 MG TABS   Oxycodone  HCl 10 MG TABS   Osteoarthritis - Primary   Severe pain - fingers - great response to steroids Start low dose steroids  Potential benefits of a long term steroid  use as well as potential risks  and complications were explained to the patient and were aknowledged. Take Vit D3+K2      Relevant Medications   predniSONE  (DELTASONE ) 5 MG tablet   Oxycodone  HCl 10 MG TABS   Oxycodone  HCl 10 MG TABS   Oxycodone  HCl 10 MG TABS   Sensory deficit present   Chronic pelvic, groin, vaginal, rectal, upper legs, lower abdomen numbness x  several years.  She did see a urologist and has an appointment to see a gastroenterologist.  She has not discussed it with Dr. Skeet yet.  The symptoms are likely related to syringomyelia with central cord syndrome, conus medullaris syndrome Primarily pelvis anesthesia - chronic: discussed w./Dr Skeet at the next OV that is coming up soon.       SYRINGOMYELIA   Chronic pelvic, groin, vaginal, rectal, upper legs, lower abdomen numbness x several years.  She did see a urologist and has an appointment to see a gastroenterologist.  She has not discussed it with Dr. Skeet yet.  The symptoms are likely related to syringomyelia with central cord syndrome, conus medullaris syndrome Primarily pelvis anesthesia - chronic: discussed w./Dr Skeet at the next OV that is coming up soon.         Meds ordered this encounter  Medications   predniSONE  (DELTASONE ) 5 MG tablet    Sig: Take 1-2 tablets (5-10 mg total) by mouth daily with breakfast.    Dispense:  100 tablet    Refill:  1   Oxycodone  HCl 10 MG TABS    Sig: Take 1 tablet (10 mg total) by mouth every 6 (six) hours as needed.    Dispense:  120 tablet    Refill:  0    Please fill on or after 08/03/2024. Code: M54.50   Oxycodone  HCl 10 MG TABS    Sig: Take 1 tablet (10 mg total) by mouth every 6 (six) hours as needed.    Dispense:  120 tablet    Refill:  0    Please fill on or after 09/02/2024. Code: M54.50   Oxycodone  HCl 10 MG TABS    Sig: Take 1 tablet (10 mg total) by mouth every 6 (six) hours as needed.    Dispense:  120 tablet    Refill:  0    Please fill on or after 10/02/2024. Code: M54.50      Follow-up: Return in about 3 months (around 10/05/2024) for a follow-up visit.  Marolyn Noel, MD

## 2024-07-06 NOTE — Assessment & Plan Note (Signed)
Due to syringomyelia On Oxycodone  Potential benefits of a long term opioids use as well as potential risks (i.e. addiction risk, apnea etc) and complications (i.e. Somnolence, constipation and others) were explained to the patient and were aknowledged. 

## 2024-07-06 NOTE — Assessment & Plan Note (Signed)
 Chronic pelvic, groin, vaginal, rectal, upper legs, lower abdomen numbness x several years.  She did see a urologist and has an appointment to see a gastroenterologist.  She has not discussed it with Dr. Skeet yet.  The symptoms are likely related to syringomyelia with central cord syndrome, conus medullaris syndrome Primarily pelvis anesthesia - chronic: discussed w./Dr Skeet at the next OV that is coming up soon.

## 2024-07-06 NOTE — Assessment & Plan Note (Signed)
 Severe pain - fingers - great response to steroids Start low dose steroids  Potential benefits of a long term steroid  use as well as potential risks  and complications were explained to the patient and were aknowledged. Take Vit D3+K2

## 2024-07-06 NOTE — Patient Instructions (Addendum)
 Take Vit D3+K2  iReliev Wireless TENS + EMS Therapeutic Wearable System Wireless TENS Unit + Muscle Stimulator Combination for Pain Relief, Arthritis, Muscle Conditioning, Muscle Strength

## 2024-07-06 NOTE — Assessment & Plan Note (Signed)
 Pt lost wt Cont w/Phentermine  Monitor CBG

## 2024-07-07 ENCOUNTER — Other Ambulatory Visit: Payer: Self-pay | Admitting: Gastroenterology

## 2024-07-07 DIAGNOSIS — R449 Unspecified symptoms and signs involving general sensations and perceptions: Secondary | ICD-10-CM | POA: Insufficient documentation

## 2024-07-07 NOTE — Assessment & Plan Note (Signed)
 Chronic pelvic, groin, vaginal, rectal, upper legs, lower abdomen numbness x several years.  She did see a urologist and has an appointment to see a gastroenterologist.  She has not discussed it with Dr. Skeet yet.  The symptoms are likely related to syringomyelia with central cord syndrome, conus medullaris syndrome Primarily pelvis anesthesia - chronic: discussed w./Dr Skeet at the next OV that is coming up soon.

## 2024-07-15 ENCOUNTER — Ambulatory Visit: Admitting: Neurology

## 2024-07-15 DIAGNOSIS — G43709 Chronic migraine without aura, not intractable, without status migrainosus: Secondary | ICD-10-CM | POA: Diagnosis not present

## 2024-07-15 MED ORDER — ONABOTULINUMTOXINA 100 UNITS IJ SOLR
200.0000 [IU] | Freq: Once | INTRAMUSCULAR | Status: AC
  Administered 2024-07-15: 155 [IU] via INTRAMUSCULAR

## 2024-07-15 NOTE — Progress Notes (Signed)

## 2024-07-20 ENCOUNTER — Ambulatory Visit: Admitting: Gastroenterology

## 2024-07-20 ENCOUNTER — Encounter: Payer: Self-pay | Admitting: Gastroenterology

## 2024-07-20 VITALS — BP 135/76 | HR 70 | Temp 97.3°F | Resp 14 | Ht 65.0 in | Wt 152.0 lb

## 2024-07-20 DIAGNOSIS — K317 Polyp of stomach and duodenum: Secondary | ICD-10-CM | POA: Diagnosis not present

## 2024-07-20 DIAGNOSIS — K449 Diaphragmatic hernia without obstruction or gangrene: Secondary | ICD-10-CM

## 2024-07-20 DIAGNOSIS — Q439 Congenital malformation of intestine, unspecified: Secondary | ICD-10-CM | POA: Diagnosis not present

## 2024-07-20 DIAGNOSIS — K5730 Diverticulosis of large intestine without perforation or abscess without bleeding: Secondary | ICD-10-CM

## 2024-07-20 DIAGNOSIS — D123 Benign neoplasm of transverse colon: Secondary | ICD-10-CM | POA: Diagnosis not present

## 2024-07-20 DIAGNOSIS — K648 Other hemorrhoids: Secondary | ICD-10-CM

## 2024-07-20 DIAGNOSIS — D120 Benign neoplasm of cecum: Secondary | ICD-10-CM

## 2024-07-20 DIAGNOSIS — K6389 Other specified diseases of intestine: Secondary | ICD-10-CM | POA: Diagnosis not present

## 2024-07-20 DIAGNOSIS — Z1211 Encounter for screening for malignant neoplasm of colon: Secondary | ICD-10-CM | POA: Diagnosis not present

## 2024-07-20 DIAGNOSIS — K625 Hemorrhage of anus and rectum: Secondary | ICD-10-CM | POA: Diagnosis not present

## 2024-07-20 DIAGNOSIS — K219 Gastro-esophageal reflux disease without esophagitis: Secondary | ICD-10-CM

## 2024-07-20 DIAGNOSIS — R1310 Dysphagia, unspecified: Secondary | ICD-10-CM

## 2024-07-20 DIAGNOSIS — K635 Polyp of colon: Secondary | ICD-10-CM | POA: Diagnosis not present

## 2024-07-20 MED ORDER — SODIUM CHLORIDE 0.9 % IV SOLN: 500.0000 mL | Freq: Once | INTRAVENOUS | Status: DC

## 2024-07-20 NOTE — Op Note (Signed)
 [facility] Patient Name: Beth Huffman Procedure Date: 07/20/2024 2:23 PM MRN: [id number] Endoscopist: Elspeth P. Leigh , MD, [id number] Age: 63 Referring MD:  Date of Birth: Jan 18, 1961 Gender: Female Account #: [id number] Procedure:                Colonoscopy Indications:              Rectal bleeding in the setting of constipation, on                            Linzess  with improved constipation. Last exam in                            2017 was limited by prep. Double prep used for this                            exam. Medicines:                Monitored Anesthesia Care Procedure:                Pre-Anesthesia Assessment:                           - Prior to the procedure, a History and Physical                            was performed, and patient medications and                            allergies were reviewed. The patient's tolerance of                            previous anesthesia was also reviewed. The risks                            and benefits of the procedure and the sedation                            options and risks were discussed with the patient.                            All questions were answered, and informed consent                            was obtained. Prior Anticoagulants: The patient has                            taken no anticoagulant or antiplatelet agents. ASA                            Grade Assessment: II - A patient with mild systemic                            disease. After reviewing the risks and benefits,  the patient was deemed in satisfactory condition to                            undergo the procedure.                           After obtaining informed consent, the colonoscope                            was passed under direct vision. Throughout the                            procedure, the patient's blood pressure, pulse, and                            oxygen saturations were monitored  continuously. The                            Olympus Scope SN: [id number] was introduced through                            the anus and advanced to the the cecum, identified                            by appendiceal orifice and ileocecal valve. The                            colonoscopy was performed without difficulty. The                            patient tolerated the procedure well. The quality                            of the bowel preparation was adequate. The                            ileocecal valve, appendiceal orifice, and rectum                            were photographed. Scope In: 2:54:22 PM Scope Out: 3:23:01 PM Scope Withdrawal Time: 0 hours 22 minutes 34 seconds  Total Procedure Duration: 0 hours 28 minutes 39 seconds  Findings:                 The perianal and digital rectal examinations were                            normal.                           Diffuse mild melanosis was found in the entire                            colon.  A few small-mouthed diverticula were found in the                            transverse colon and left colon.                           A 3 mm polyp was found in the cecum. The polyp was                            sessile. The polyp was removed with a cold snare.                            Resection and retrieval were complete.                           A 3 mm polyp was found in the transverse colon. The                            polyp was sessile. The polyp was removed with a                            cold snare. Resection and retrieval were complete.                           The colon was extremely tortuous which prolonged                            the exam. Pediatric colonoscope used for this exam.                           Internal hemorrhoids were found during                            retroflexion. The hemorrhoids were small.                           The exam was otherwise without  abnormality. Complications:            No immediate complications. Estimated blood loss:                            Minimal. Estimated Blood Loss:     Estimated blood loss was minimal. Impression:               - Melanosis in the colon.                           - Diverticulosis in the transverse colon and in the                            left colon.                           - One 3 mm polyp in the cecum, removed with a cold  snare. Resected and retrieved.                           - One 3 mm polyp in the transverse colon, removed                            with a cold snare. Resected and retrieved.                           - Tortuous colon.                           - Internal hemorrhoids.                           - The examination was otherwise normal.                           Suspect hemorrhoidal bleeding in the setting of                            constipation, no high risk / concerning pathology. Recommendation:           - Patient has a contact number available for                            emergencies. The signs and symptoms of potential                            delayed complications were discussed with the                            patient. Return to normal activities tomorrow.                            Written discharge instructions were provided to the                            patient.                           - Resume previous diet.                           - Continue present medications.                           - Continue Linzess  for constipation if that is                            working.                           - Await pathology results. Elspeth P. Leigh, MD 07/20/2024 3:29:50 PM This report has been signed electronically.

## 2024-07-20 NOTE — Op Note (Signed)
 [facility] Patient Name: Beth Huffman Procedure Date: 07/20/2024 2:24 PM MRN: [id number] Endoscopist: Elspeth P. Leigh , MD, [id number] Age: 63 Referring MD:  Date of Birth: Feb 05, 1961 Gender: Female Account #: [id number] Procedure:                Upper GI endoscopy Indications:              Dysphagia, Follow-up of gastro-esophageal reflux                            disease - improved on protonix  twice daily but                            dysphagia persists. Medicines:                Monitored Anesthesia Care Procedure:                Pre-Anesthesia Assessment:                           - Prior to the procedure, a History and Physical                            was performed, and patient medications and                            allergies were reviewed. The patient's tolerance of                            previous anesthesia was also reviewed. The risks                            and benefits of the procedure and the sedation                            options and risks were discussed with the patient.                            All questions were answered, and informed consent                            was obtained. Prior Anticoagulants: The patient has                            taken no anticoagulant or antiplatelet agents. ASA                            Grade Assessment: II - A patient with mild systemic                            disease. After reviewing the risks and benefits,                            the patient was deemed in satisfactory condition to  undergo the procedure.                           After obtaining informed consent, the endoscope was                            passed under direct vision. Throughout the                            procedure, the patient's blood pressure, pulse, and                            oxygen saturations were monitored continuously. The                            GIF [id number] #[id number] was  introduced through the                            mouth, and advanced to the second part of duodenum.                            The upper GI endoscopy was accomplished without                            difficulty. The patient tolerated the procedure                            well. Scope In: Scope Out: Findings:                 Esophagogastric landmarks were identified: the                            Z-line was found at 36 cm, the gastroesophageal                            junction was found at 36 cm and the upper extent of                            the gastric folds was found at 39 cm from the                            incisors.                           A 3 cm hiatal hernia was present.                           The exam of the esophagus was otherwise normal. No                            obvious stenosis / stricture appreciated, no                            inflammatory changes.  A guidewire was placed and the scope was withdrawn.                            Empiric dilation was initially attempted with a                            17mm dilator, however it could not traverse the                            posterior pharynx and encountered resistance. I                            then attempt dilated with a 15mm Savary dilator                            with mild resistance but able to traverse the                            posterior pharynx. Relook endoscopy showed NO                            mucosal disruption.                           A few small sessile polyps were found in the                            gastric body. Likelhy benign fundic gland polyps.                            Biopsies were taken with a cold forceps for                            histology, rule out adenoma.                           The exam of the stomach was otherwise normal.                           The examined duodenum was normal. Complications:            No immediate  complications. Estimated blood loss:                            Minimal. Estimated Blood Loss:     Estimated blood loss was minimal. Impression:               - Esophagogastric landmarks identified.                           - 3 cm hiatal hernia.                           - Normal esophagus otherwise - empiric dilation  performed to 15mm. As above, 17mm could not be                            passed.                           - A few gastric polyps. Biopsied.                           - Normal stomach otherwise.                           - Normal examined duodenum. Recommendation:           - Patient has a contact number available for                            emergencies. The signs and symptoms of potential                            delayed complications were discussed with the                            patient. Return to normal activities tomorrow.                            Written discharge instructions were provided to the                            patient.                           - Resume previous diet.                           - Continue present medications.                           - Continue protonix , long term use the lowest dose                            needed to control symptoms                           - Await pathology results and course post dilation.                            If dysphagia persists, consider barium swallow and                            / or modified barium swallow to help localize                            dysphagia. Elspeth P. Leigh, MD 07/20/2024 3:34:38 PM This report has been signed electronically.

## 2024-07-20 NOTE — Progress Notes (Signed)
 Called to room to assist during endoscopic procedure.  Patient ID and intended procedure confirmed with present staff. Received instructions for my participation in the procedure from the performing physician.

## 2024-07-20 NOTE — Patient Instructions (Signed)
 Please read handouts provided. Continue present medications. Continue Protonix , long term use the lowest dose neededto control symptoms. Resume previous diet. Continue Linzess  for constipation if that is working. Await pathology results.  YOU HAD AN ENDOSCOPIC PROCEDURE TODAY AT THE [facility]:   Refer to the procedure report that was given to you for any specific questions about what was found during the examination.  If the procedure report does not answer your questions, please call your gastroenterologist to clarify.  If you requested that your care partner not be given the details of your procedure findings, then the procedure report has been included in a sealed envelope for you to review at your convenience later.  YOU SHOULD EXPECT: Some feelings of bloating in the abdomen. Passage of more gas than usual.  Walking can help get rid of the air that was put into your GI tract during the procedure and reduce the bloating. If you had a lower endoscopy (such as a colonoscopy or flexible sigmoidoscopy) you may notice spotting of blood in your stool or on the toilet paper. If you underwent a bowel prep for your procedure, you may not have a normal bowel movement for a few days.  Please Note:  You might notice some irritation and congestion in your nose or some drainage.  This is from the oxygen used during your procedure.  There is no need for concern and it should clear up in a day or so.  SYMPTOMS TO REPORT IMMEDIATELY:  Following lower endoscopy (colonoscopy or flexible sigmoidoscopy):  Excessive amounts of blood in the stool  Significant tenderness or worsening of abdominal pains  Swelling of the abdomen that is new, acute  Fever of 100F or higher  Following upper endoscopy (EGD)  Vomiting of blood or coffee ground material  New chest pain or pain under the shoulder blades  Painful or persistently difficult swallowing  New shortness of breath  Fever of 100F or  higher  Black, tarry-looking stools  For urgent or emergent issues, a gastroenterologist can be reached at any hour by calling [phone]. Do not use MyChart messaging for urgent concerns.    DIET:  We do recommend a small meal at first, but then you may proceed to your regular diet.  Drink plenty of fluids but you should avoid alcoholic beverages for 24 hours.  ACTIVITY:  You should plan to take it easy for the rest of today and you should NOT DRIVE or use heavy machinery until tomorrow (because of the sedation medicines used during the test).    FOLLOW UP: Our staff will call the number listed on your records the next business day following your procedure.  We will call around 7:15- 8:00 am to check on you and address any questions or concerns that you may have regarding the information given to you following your procedure. If we do not reach you, we will leave a message.     If any biopsies were taken you will be contacted by phone or by letter within the next 1-3 weeks.  Please call us  at [phone] if you have not heard about the biopsies in 3 weeks.    SIGNATURES/CONFIDENTIALITY: You and/or your care partner have signed paperwork which will be entered into your electronic medical record.  These signatures attest to the fact that that the information above on your After Visit Summary has been reviewed and is understood.  Full responsibility of the confidentiality of this discharge information lies with you and/or  your care-partner.

## 2024-07-20 NOTE — Progress Notes (Signed)
 [facility] History and Physical   Primary Care Physician:  Plotnikov, Karlynn [name], MD   Reason for Procedure:   Rectal bleeding / screening, GERD / dysphagia  Plan:    EGD and colonoscopy     HPI: Beth Huffman is a 63 y.o. female  here for EGD and colonoscopy. See note 7/11 for details - escalating doses of PPI to protonix  40mg  BID and she also has dysphagia, EGD to further evaluate. Protonix  has helped GERD but dysphagia persists.  Last colonoscopy 2017, inadequate prep. She has had some intermittent bleeding symptoms in the setting of constipation. On linzess .    Otherwise feels well without any cardiopulmonary symptoms.   I have discussed risks / benefits of anesthesia and endoscopic procedure with Beth Huffman and they wish to proceed with the exams as outlined today.    Past Medical History:  Diagnosis Date   Abnormal weight gain    Acute sinusitis 12/05/2008   1/18 8/18   Arachnoid cyst 08/19/2011   Chest tightness or pressure 05/07/2018   Constipation 09/08/2012   Chronic - opioid related 11/17 Amitiza    Contact dermatitis 03/16/2013   5/14 relapsing - poison ivy    Dyspnea 05/07/2018   Elevated liver enzymes 03/04/2011   Chronic off and on    Female stress incontinence 02/06/2009   Qualifier: Diagnosis of  By: Garald MD, Karlynn [name]    FEVER UNSPECIFIED 09/29/2007   Qualifier: Diagnosis of  By: Garald MD, Karlynn [name]    GERD 09/29/2007   Chronic  Protonix  prn  Potential benefits of a long term PPI use as well as potential risks  and complications were explained to the patient and were aknowledged.     GERD (gastroesophageal reflux disease)    Headache 12/20/2007   Dr Gladis Migraines (1 per 2 wks) Topamax  400 mg/d; Imitrex  prn; Ibuprofen  prn, Zofran  prn    Hyperglycemia 08/19/2011   Mild     Hypertension    Increased blood pressure (not hypertension) 08/23/2014   Intractable migraine 06/14/2009   Chronic Ibuprofen , Imitrex , Topamax ,   Nortriptyline  - intolerant   Knee pain 03/04/2011   R knee 2018 L knee pain - L knee b anserina and L knee is tender w/ROM: L knee pain is worse - patellofemoral syndrome vs other   LBP (low back pain)    Local anesthetic drug adverse reaction 01/21/2022   LOW BACK PAIN 09/29/2007   Chronic, lately (2016) disabling Due to syringomyelia On Oxycodone   Potential benefits of a long term opioids use as well as potential risks (i.e. addiction risk, apnea etc) and complications (i.e. Somnolence, constipation and others) were explained to the patient and were aknowledged.     LUQ PAIN 05/23/2010   Qualifier: Diagnosis of  By: Plotnikov MD, Karlynn [name]    Migraine, chronic, without aura, intractable 10/28/2017   Botox  approved diagnosis. 7/19 Worse. Start Emagility inj. Relpax    Migraines    Nausea & vomiting 08/19/2011   Nausea without vomiting 06/14/2009   Due to migraines     NECK PAIN 06/14/2009    Chronic, lately (2016) disabling Due to syringomyelia On Oxycodone   Potential benefits of a long term opioids use as well as potential risks (i.e. addiction risk, apnea etc) and complications (i.e. Somnolence, constipation and others) were explained to the patient and were aknowledged.     NONSPECIFIC ABNORMAL RESULTS LIVR FUNCTION STUDY 09/28/2009   Qualifier: Diagnosis of  By: Garald MD, Karlynn [name]    OBSESSIVE-COMPULSIVE DISORDER  05/23/2010   Chronic - compulsive shopping    Onychomycosis 01/30/2016   4/17    Orthostatic hypotension 06/30/2013   9/14 likely Rapaflo induced    OTITIS EXTERNA 05/10/2008   Qualifier: Diagnosis of  By: Garald MD, Karlynn [name]    Palpitations 06/21/2014   PARESTHESIA 07/13/2008   Qualifier: Diagnosis of  By: Garald MD, Karlynn [name]    Partial thickness burn of left wrist 09/11/2015   Postoperative state 07/24/2016   Pyelonephritis 2008   Rash 07/27/2017   Stress 2009   Syncope, near 06/30/2013   9/14 likely Rapaflo induced 12/14 relapsing off Rapaflo x 7 2/15  no relapsing since on 1/2 dose of Topamax     Syringobulbia (HCC)    SYRINGOMYELIA 08/20/2007   Chronic Dr Onita Dr [facility] at [facility] Chronic pain    Syringomyelia (HCC)    Tachycardia    Tonsillitis, chronic 02/26/2017   2018 worse   URINARY TRACT INFECTION (UTI) 03/23/2008   Qualifier: Diagnosis of  By: Garald MD, Karlynn [name]    UTI (lower urinary tract infection)    Well adult exam 12/21/2013   We discussed age appropriate health related issues, including available/recomended screening tests and vaccinations. We discussed a need for adhering to healthy diet and exercise. Labs/EKG were reviewed/ordered. All questions were answered.       Past Surgical History:  Procedure Laterality Date   ABDOMINAL HYSTERECTOMY     ANTERIOR AND POSTERIOR REPAIR N/A 07/24/2016   Procedure: Possible ANTERIOR (CYSTOCELE) repair, perineoplasty;  Surgeon: Percilla Burly, MD;  Location: [facility];  Service: Gynecology;  Laterality: N/A;   BLADDER SUSPENSION N/A 07/24/2016   Procedure: TRANSVAGINAL TAPE (TVT) PROCEDURE;  Surgeon: Percilla Burly, MD;  Location: [facility];  Service: Gynecology;  Laterality: N/A;   COLONOSCOPY     CYSTOSCOPY N/A 07/24/2016   Procedure: CYSTOSCOPY;  Surgeon: Percilla Burly, MD;  Location: [facility];  Service: Gynecology;  Laterality: N/A;   FOOT SURGERY Right 2023   FOOT SURGERY Right 2023   Scraped top of foot   intracranial decompression surgery  2006   LEFT HEART CATH AND CORONARY ANGIOGRAPHY N/A 05/07/2018   Procedure: LEFT HEART CATH AND CORONARY ANGIOGRAPHY;  Surgeon: Claudene Victory ORN, MD;  Location: [facility];  Service: Cardiovascular;  Laterality: N/A;   PARTIAL KNEE ARTHROPLASTY Left 09/07/2018   Procedure: UNICOMPARTMENTAL LEFT KNEE;  Surgeon: Beverley Evalene BIRCH, MD;  Location: [facility];  Service: Orthopedics;  Laterality: Left;  Adductor Block   ROBOTIC ASSISTED LAPAROSCOPIC SACROCOLPOPEXY N/A 07/24/2016   Procedure: ROBOTIC ASSISTED LAPAROSCOPIC SACROCOLPOPEXY  WITH PERINEOPLASTY;  Surgeon: Percilla Burly, MD;  Location: [facility];  Service: Gynecology;  Laterality: N/A;   TUBAL LIGATION      Prior to Admission medications   Medication Sig Start Date End Date Taking? Authorizing Provider  botulinum toxin Type A  (BOTOX ) 200 units injection Inject 155 units IM into multiple site in the face,neck and head once every 90 days 02/04/24  Yes Jaffe, Adam R, DO  cholecalciferol (VITAMIN D ) 1000 UNITS tablet Take 1,000 Units by mouth daily.   Yes [provider]  cyclobenzaprine  (FLEXERIL ) 5 MG tablet TAKE 1 TABLET THREE TIMES DAILY AS NEEDED FOR MUSCLE SPASMS. 05/13/23  Yes Plotnikov, Aleksei V, MD  Erenumab -aooe (AIMOVIG ) 140 MG/ML SOAJ Inject 140 mg into the skin every 28 (twenty-eight) days. 05/02/24  Yes Jaffe, Adam R, DO  estradiol  (ESTRACE ) 0.5 MG tablet Take 1 tablet (0.5 mg total) by mouth daily. 06/05/24  Yes Plotnikov, Aleksei  V, MD  linaclotide  (LINZESS ) 290 MCG CAPS capsule TAKE 1 CAPSULE BY MOUTH EVERY MORNING BEFORE BREAKFAST 07/07/24  Yes Jerzie Bieri, Elspeth SQUIBB, MD  Multiple Vitamin (MULTIVITAMIN PO) Take 1 tablet by mouth daily.   Yes [provider]  ondansetron  (ZOFRAN ) 8 MG tablet TAKE ONE TABLET BY MOUTH EVERY 8 HOURS AS NEEDED FOR NAUSEA OR FOR VOMITING 10/04/23  Yes Plotnikov, Aleksei V, MD  Oxycodone  HCl 10 MG TABS Take 1 tablet (10 mg total) by mouth every 6 (six) hours as needed. 07/06/24  Yes Plotnikov, Karlynn [name], MD  Oxycodone  HCl 10 MG TABS Take 1 tablet (10 mg total) by mouth every 6 (six) hours as needed. 07/06/24  Yes Plotnikov, Karlynn [name], MD  Oxycodone  HCl 10 MG TABS Take 1 tablet (10 mg total) by mouth every 6 (six) hours as needed. 07/06/24  Yes Plotnikov, Karlynn [name], MD  oxymetazoline  (AFRIN NASAL SPRAY) 0.05 % nasal spray Place 1 spray into both nostrils 2 (two) times daily. 04/02/23  Yes Plotnikov, Aleksei V, MD  pantoprazole  (PROTONIX ) 40 MG tablet Take 1 tablet (40 mg total) by mouth 2 (two) times daily before a meal.  07/04/24  Yes Dorethia Jeanmarie, Elspeth SQUIBB, MD  polyethylene glycol (MIRALAX  / GLYCOLAX ) packet Take 8.5 g by mouth every other day.   Yes [provider]  predniSONE  (DELTASONE ) 5 MG tablet Take 1-2 tablets (5-10 mg total) by mouth daily with breakfast. 07/06/24  Yes Plotnikov, Aleksei V, MD  rizatriptan  (MAXALT ) 10 MG tablet TAKE 1 TABLET AT ONSET OF MIGRAINE- MAY REPEAT ONCE IN 2 HOURS. LIMIT 2/24 HOURS. AVOID DAILY USE. 04/25/24  Yes Plotnikov, Aleksei V, MD  senna (SENOKOT) 8.6 MG tablet Take 1 tablet by mouth as needed for constipation.   Yes [provider]  verapamil  (CALAN -SR) 120 MG CR tablet TAKE ONE TABLET BY MOUTH AT BEDTIME 05/05/24  Yes Fernande Elspeth BROCKS, MD  fluticasone  furoate-vilanterol (BREO ELLIPTA ) 100-25 MCG/ACT AEPB Inhale 1 puff into the lungs daily. 02/03/24   Meade Verdon RAMAN, MD  levalbuterol  (XOPENEX  HFA) 45 MCG/ACT inhaler Inhale 2 puffs into the lungs every 6 (six) hours as needed for wheezing or shortness of breath. Patient not taking: Reported on 07/20/2024 02/03/24   Meade Verdon RAMAN, MD  naproxen  (NAPROSYN ) 500 MG tablet TAKE ONE TABLET BY MOUTH TWICE DAILY WITH A MEAL 01/28/24   Plotnikov, Aleksei V, MD  nitroGLYCERIN  (NITROSTAT ) 0.4 MG SL tablet Place 1 tablet (0.4 mg total) under the tongue every 5 (five) minutes as needed for chest pain. 12/21/23   Fernande Elspeth BROCKS, MD  Rimegepant Sulfate (NURTEC) 75 MG TBDP Take 1 tablet (75 mg total) by mouth daily as needed. Patient not taking: Reported on 07/20/2024 05/06/24   Skeet Juliene SAUNDERS, DO    Current Outpatient Medications  Medication Sig Dispense Refill   botulinum toxin Type A  (BOTOX ) 200 units injection Inject 155 units IM into multiple site in the face,neck and head once every 90 days 1 each 4   cholecalciferol (VITAMIN D ) 1000 UNITS tablet Take 1,000 Units by mouth daily.     cyclobenzaprine  (FLEXERIL ) 5 MG tablet TAKE 1 TABLET THREE TIMES DAILY AS NEEDED FOR MUSCLE SPASMS. 90 tablet 3   Erenumab -aooe (AIMOVIG ) 140 MG/ML  SOAJ Inject 140 mg into the skin every 28 (twenty-eight) days. 1.12 mL 5   estradiol  (ESTRACE ) 0.5 MG tablet Take 1 tablet (0.5 mg total) by mouth daily. 90 tablet 1   linaclotide  (LINZESS ) 290 MCG CAPS capsule TAKE 1 CAPSULE BY  MOUTH EVERY MORNING BEFORE BREAKFAST 30 capsule 0   Multiple Vitamin (MULTIVITAMIN PO) Take 1 tablet by mouth daily.     ondansetron  (ZOFRAN ) 8 MG tablet TAKE ONE TABLET BY MOUTH EVERY 8 HOURS AS NEEDED FOR NAUSEA OR FOR VOMITING 21 tablet 3   Oxycodone  HCl 10 MG TABS Take 1 tablet (10 mg total) by mouth every 6 (six) hours as needed. 120 tablet 0   Oxycodone  HCl 10 MG TABS Take 1 tablet (10 mg total) by mouth every 6 (six) hours as needed. 120 tablet 0   Oxycodone  HCl 10 MG TABS Take 1 tablet (10 mg total) by mouth every 6 (six) hours as needed. 120 tablet 0   oxymetazoline  (AFRIN NASAL SPRAY) 0.05 % nasal spray Place 1 spray into both nostrils 2 (two) times daily. 30 mL 0   pantoprazole  (PROTONIX ) 40 MG tablet Take 1 tablet (40 mg total) by mouth 2 (two) times daily before a meal. 60 tablet 2   polyethylene glycol (MIRALAX  / GLYCOLAX ) packet Take 8.5 g by mouth every other day.     predniSONE  (DELTASONE ) 5 MG tablet Take 1-2 tablets (5-10 mg total) by mouth daily with breakfast. 100 tablet 1   rizatriptan  (MAXALT ) 10 MG tablet TAKE 1 TABLET AT ONSET OF MIGRAINE- MAY REPEAT ONCE IN 2 HOURS. LIMIT 2/24 HOURS. AVOID DAILY USE. 8 tablet 5   senna (SENOKOT) 8.6 MG tablet Take 1 tablet by mouth as needed for constipation.     verapamil  (CALAN -SR) 120 MG CR tablet TAKE ONE TABLET BY MOUTH AT BEDTIME 90 tablet 1   fluticasone  furoate-vilanterol (BREO ELLIPTA ) 100-25 MCG/ACT AEPB Inhale 1 puff into the lungs daily. 1 each 5   levalbuterol  (XOPENEX  HFA) 45 MCG/ACT inhaler Inhale 2 puffs into the lungs every 6 (six) hours as needed for wheezing or shortness of breath. (Patient not taking: Reported on 07/20/2024) 1 each 12   naproxen  (NAPROSYN ) 500 MG tablet TAKE ONE TABLET BY MOUTH  TWICE DAILY WITH A MEAL 60 tablet 3   nitroGLYCERIN  (NITROSTAT ) 0.4 MG SL tablet Place 1 tablet (0.4 mg total) under the tongue every 5 (five) minutes as needed for chest pain. 25 tablet 3   Rimegepant Sulfate (NURTEC) 75 MG TBDP Take 1 tablet (75 mg total) by mouth daily as needed. (Patient not taking: Reported on 07/20/2024) 16 tablet 11   Current Facility-Administered Medications  Medication Dose Route Frequency Provider Last Rate Last Admin   0.9 %  sodium chloride  infusion  500 mL Intravenous Once Lillionna Nabi P, MD        Allergies as of 07/20/2024 - Review Complete 07/20/2024  Allergen Reaction Noted   Atenolol  Other (See Comments) 06/21/2014   Cefuroxime axetil Nausea Only and Other (See Comments) 12/28/2007   Codeine sulfate Nausea And Vomiting and Nausea Only    Fentanyl  Nausea And Vomiting and Other (See Comments) 02/06/2009   Imdur  [isosorbide  nitrate] Other (See Comments) 12/24/2021   Iodinated contrast media Rash 05/03/2018   Nitrofurantoin Other (See Comments) 03/23/2008   Pregabalin Other (See Comments)    Rapaflo [silodosin] Other (See Comments) 06/30/2013   Topamax  [topiramate ] Other (See Comments) 07/31/2021   Tramadol Other (See Comments) 12/25/2021   Tramadol hcl Nausea And Vomiting     Family History  Problem Relation Age of Onset   Colon polyps Mother    COPD Mother    Peripheral vascular disease Mother    Hypertension Mother    Heart disease Mother    Hypertension Father  Breast cancer Maternal Aunt    Colon cancer Maternal Uncle 60   Heart attack Maternal Grandmother    Stroke Maternal Grandfather    Stroke Paternal Grandfather    Asthma Granddaughter    Anxiety disorder Other    Esophageal cancer Neg Hx    Pancreatic cancer Neg Hx    Stomach cancer Neg Hx     Social History   Socioeconomic History   Marital status: Married    Spouse name: Manus   Number of children: 2   Years of education: [employer] education level: Some  college, no degree  Occupational History   Occupation: Clinical biochemist: SELF-EMPLOYED   Occupation: disability  Tobacco Use   Smoking status: Never    Passive exposure: Past   Smokeless tobacco: Never  Vaping Use   Vaping status: Never Used  Substance and Sexual Activity   Alcohol use: No   Drug use: No   Sexual activity: Yes    Partners: Male    Birth control/protection: Surgical    Comment: BTL, Hyst; >16; <5  Other Topics Concern   Not on file  Social History Narrative   Lives at home with her husband and 1 dog./2025   Left handed   1 cup caffeine per day.   Social Drivers of Corporate investment banker Strain: Low Risk  (05/27/2024)   Overall Financial Resource Strain (CARDIA)    Difficulty of Paying Living Expenses: Not hard at all  Food Insecurity: No Food Insecurity (05/27/2024)   Hunger Vital Sign    Worried About Running Out of Food in the Last Year: Never true    Ran Out of Food in the Last Year: Never true  Transportation Needs: No Transportation Needs (05/27/2024)   PRAPARE - Administrator, Civil Service (Medical): No    Lack of Transportation (Non-Medical): No  Physical Activity: Insufficiently Active (05/27/2024)   Exercise Vital Sign    Days of Exercise per Week: 3 days    Minutes of Exercise per Session: 30 min  Stress: No Stress Concern Present (05/27/2024)   [employer] of Occupational Health - Occupational Stress Questionnaire    Feeling of Stress: Not at all  Social Connections: Moderately Integrated (05/27/2024)   Social Connection and Isolation Panel    Frequency of Communication with Friends and Family: Twice a week    Frequency of Social Gatherings with Friends and Family: Once a week    Attends Religious Services: Patient declined    Database administrator or Organizations: Yes    Attends Engineer, structural: More than 4 times per year    Marital Status: Married  Catering manager Violence: Not At Risk  (03/28/2024)   Humiliation, Afraid, Rape, and Kick questionnaire    Fear of Current or Ex-Partner: No    Emotionally Abused: No    Physically Abused: No    Sexually Abused: No    Review of Systems: All other review of systems negative except as mentioned in the HPI.  Physical Exam: Vital signs BP (!) 147/80   Pulse 73   Temp (!) 97.3 F (36.3 C) (Temporal)   Ht 5' 5 (1.651 m)   Wt 152 lb (68.9 kg)   SpO2 97%   BMI 25.29 kg/m   General:   Alert,  Well-developed, pleasant and cooperative in NAD Lungs:  Clear throughout to auscultation.   Heart:  Regular rate and rhythm Abdomen:  Soft, nontender  and nondistended.   Neuro/Psych:  Alert and cooperative. Normal mood and affect. A and O x 3  Marcey Naval, MD [facility]

## 2024-07-21 ENCOUNTER — Telehealth: Payer: Self-pay

## 2024-07-21 NOTE — Telephone Encounter (Signed)
  Follow up Call-     07/20/2024    1:34 PM  Call back number  Post procedure Call Back phone  # [phone]  Permission to leave phone message Yes     Patient questions:  Do you have a fever, pain , or abdominal swelling? No. Pain Score  0 *  Have you tolerated food without any problems? Yes.    Have you been able to return to your normal activities? Yes.    Do you have any questions about your discharge instructions: Diet   No. Medications  No. Follow up visit  No.  Do you have questions or concerns about your Care? No.  Actions: * If pain score is 4 or above: No action needed, pain <4.

## 2024-07-25 LAB — SURGICAL PATHOLOGY

## 2024-07-26 ENCOUNTER — Ambulatory Visit: Payer: Self-pay | Admitting: Gastroenterology

## 2024-07-27 ENCOUNTER — Other Ambulatory Visit: Payer: Self-pay | Admitting: Internal Medicine

## 2024-08-01 ENCOUNTER — Ambulatory Visit (INDEPENDENT_AMBULATORY_CARE_PROVIDER_SITE_OTHER): Admitting: Podiatry

## 2024-08-01 ENCOUNTER — Ambulatory Visit (INDEPENDENT_AMBULATORY_CARE_PROVIDER_SITE_OTHER)

## 2024-08-01 DIAGNOSIS — M7741 Metatarsalgia, right foot: Secondary | ICD-10-CM

## 2024-08-01 DIAGNOSIS — R52 Pain, unspecified: Secondary | ICD-10-CM

## 2024-08-01 NOTE — Progress Notes (Signed)
 No chief complaint on file.   HPI: 63 y.o. female presenting today for chronic pain associated to the right forefoot.  Past Medical History:  Diagnosis Date   Abnormal weight gain    Acute sinusitis 12/05/2008   1/18 8/18   Arachnoid cyst 08/19/2011   Chest tightness or pressure 05/07/2018   Constipation 09/08/2012   Chronic - opioid related 11/17 Amitiza    Contact dermatitis 03/16/2013   5/14 relapsing - poison ivy    Dyspnea 05/07/2018   Elevated liver enzymes 03/04/2011   Chronic off and on    Female stress incontinence 02/06/2009   Qualifier: Diagnosis of  By: Garald MD, Karlynn [name]    FEVER UNSPECIFIED 09/29/2007   Qualifier: Diagnosis of  By: Garald MD, Karlynn [name]    GERD 09/29/2007   Chronic  Protonix  prn  Potential benefits of a long term PPI use as well as potential risks  and complications were explained to the patient and were aknowledged.     GERD (gastroesophageal reflux disease)    Headache 12/20/2007   Dr Gladis Migraines (1 per 2 wks) Topamax  400 mg/d; Imitrex  prn; Ibuprofen  prn, Zofran  prn    Hyperglycemia 08/19/2011   Mild     Hypertension    Increased blood pressure (not hypertension) 08/23/2014   Intractable migraine 06/14/2009   Chronic Ibuprofen , Imitrex , Topamax ,  Nortriptyline  - intolerant   Knee pain 03/04/2011   R knee 2018 L knee pain - L knee b anserina and L knee is tender w/ROM: L knee pain is worse - patellofemoral syndrome vs other   LBP (low back pain)    Local anesthetic drug adverse reaction 01/21/2022   LOW BACK PAIN 09/29/2007   Chronic, lately (2016) disabling Due to syringomyelia On Oxycodone   Potential benefits of a long term opioids use as well as potential risks (i.e. addiction risk, apnea etc) and complications (i.e. Somnolence, constipation and others) were explained to the patient and were aknowledged.     LUQ PAIN 05/23/2010   Qualifier: Diagnosis of  By: Plotnikov MD, Karlynn [name]    Migraine, chronic, without aura,  intractable 10/28/2017   Botox  approved diagnosis. 7/19 Worse. Start Emagility inj. Relpax    Migraines    Nausea & vomiting 08/19/2011   Nausea without vomiting 06/14/2009   Due to migraines     NECK PAIN 06/14/2009    Chronic, lately (2016) disabling Due to syringomyelia On Oxycodone   Potential benefits of a long term opioids use as well as potential risks (i.e. addiction risk, apnea etc) and complications (i.e. Somnolence, constipation and others) were explained to the patient and were aknowledged.     NONSPECIFIC ABNORMAL RESULTS LIVR FUNCTION STUDY 09/28/2009   Qualifier: Diagnosis of  By: Garald MD, Karlynn [name]    OBSESSIVE-COMPULSIVE DISORDER 05/23/2010   Chronic - compulsive shopping    Onychomycosis 01/30/2016   4/17    Orthostatic hypotension 06/30/2013   9/14 likely Rapaflo induced    OTITIS EXTERNA 05/10/2008   Qualifier: Diagnosis of  By: Garald MD, Karlynn [name]    Palpitations 06/21/2014   PARESTHESIA 07/13/2008   Qualifier: Diagnosis of  By: Garald MD, Karlynn [name]    Partial thickness burn of left wrist 09/11/2015   Postoperative state 07/24/2016   Pyelonephritis 2008   Rash 07/27/2017   Stress 2009   Syncope, near 06/30/2013   9/14 likely Rapaflo induced 12/14 relapsing off Rapaflo x 7 2/15 no relapsing since on 1/2 dose of Topamax     Syringobulbia (HCC)  SYRINGOMYELIA 08/20/2007   Chronic Dr Onita Dr Homer at [facility] Chronic pain    Syringomyelia [facility])    Tachycardia    Tonsillitis, chronic 02/26/2017   2018 worse   URINARY TRACT INFECTION (UTI) 03/23/2008   Qualifier: Diagnosis of  By: Garald MD, Karlynn [name]    UTI (lower urinary tract infection)    Well adult exam 12/21/2013   We discussed age appropriate health related issues, including available/recomended screening tests and vaccinations. We discussed a need for adhering to healthy diet and exercise. Labs/EKG were reviewed/ordered. All questions were answered.       Past Surgical History:  Procedure  Laterality Date   ABDOMINAL HYSTERECTOMY     ANTERIOR AND POSTERIOR REPAIR N/A 07/24/2016   Procedure: Possible ANTERIOR (CYSTOCELE) repair, perineoplasty;  Surgeon: Percilla Burly, MD;  Location: [facility];  Service: Gynecology;  Laterality: N/A;   BLADDER SUSPENSION N/A 07/24/2016   Procedure: TRANSVAGINAL TAPE (TVT) PROCEDURE;  Surgeon: Percilla Burly, MD;  Location: [facility];  Service: Gynecology;  Laterality: N/A;   COLONOSCOPY  10/22/2016   Elspeth Naval at [facility], stool in colon, 1-2 yr recall   COLONOSCOPY  2005   Dr Debrah, F/V, no polyps, normal except diverticulosis   CYSTOSCOPY N/A 07/24/2016   Procedure: CYSTOSCOPY;  Surgeon: Percilla Burly, MD;  Location: [facility];  Service: Gynecology;  Laterality: N/A;   FOOT SURGERY Right 2023   FOOT SURGERY Right 2023   Scraped top of foot   intracranial decompression surgery  2006   LEFT HEART CATH AND CORONARY ANGIOGRAPHY N/A 05/07/2018   Procedure: LEFT HEART CATH AND CORONARY ANGIOGRAPHY;  Surgeon: Claudene Victory ORN, MD;  [facility];  Service: Cardiovascular;  Laterality: N/A;   PARTIAL KNEE ARTHROPLASTY Left 09/07/2018   Procedure: UNICOMPARTMENTAL LEFT KNEE;  Surgeon: Beverley Evalene BIRCH, MD;  Location: [facility];  Service: Orthopedics;  Laterality: Left;  Adductor Block   ROBOTIC ASSISTED LAPAROSCOPIC SACROCOLPOPEXY N/A 07/24/2016   Procedure: ROBOTIC ASSISTED LAPAROSCOPIC SACROCOLPOPEXY WITH PERINEOPLASTY;  Surgeon: Percilla Burly, MD;  Location: [facility];  Service: Gynecology;  Laterality: N/A;   TUBAL LIGATION      Allergies  Allergen Reactions   Atenolol  Other (See Comments)    Wt gain   Cefuroxime Axetil Nausea Only and Other (See Comments)    REACTION: nausea - but tolerates PCN   Codeine Sulfate Nausea And Vomiting and Nausea Only   Fentanyl  Nausea And Vomiting and Other (See Comments)    REACTION: bad reaction   Imdur  [Isosorbide  Nitrate] Other (See Comments)    Headaches   Iodinated Contrast Media  Rash   Nitrofurantoin Other (See Comments)    Unknown   Pregabalin Other (See Comments)    REACTION: cp   Rapaflo [Silodosin] Other (See Comments)    Orthostatic BP drop, near syncope    Topamax  [Topiramate ] Other (See Comments)    Tight chest    Tramadol Other (See Comments)   Tramadol Hcl Nausea And Vomiting    REACTION: sick     Physical Exam: General: The patient is alert and oriented x3 in no acute distress.  Dermatology: Skin is warm, dry and supple bilateral lower extremities.  Hyperkeratotic callus lesions noted to the right forefoot with multiple small central nucleated cores consistent with porokeratosis and calluses  Vascular: Palpable pedal pulses bilaterally. Capillary refill within normal limits.  No appreciable edema.  No erythema.  Neurological: Grossly intact via light touch  Musculoskeletal Exam: Diffuse generalized pain throughout palpation to the right  forefoot.  Radiographic Exam RT foot 08/01/2024:  Mostly unchanged from prior x-rays.  History of surgery to the 1st, 2nd and 3rd metatarsals and hammertoe arthroplasties to the 2nd and 3rd digits.  Slight recalcitrant hammertoe contracture noted.  No acute fractures identified.  Orthopedic hardware is intact without complication  Assessment/Plan of Care: 1. s/p exostectomy right foot. DOS: 09/11/2022  2. s/p bunionectomy with first metatarsal osteotomy and Weil shortening osteotomies of the second third digits with PIPJ arthroplasty second digit right. DOS: 06/18/2022  3.  Midfoot capsulitis/DJD left 4.  Metatarsalgia right  5.  Symptomatic calluses right forefoot  -Patient evaluated.  X-rays reviewed -Patient continues to have pain and tenderness associated to the right forefoot consistent with a chronic metatarsalgia.  She has tried different shoe gear modifications with no improvement.  I do believe that orthotics would be beneficial to support the medial longitudinal arch of the foot and offload pressure from  the forefoot.  This was discussed with the patient and she agrees would like to proceed -Today the patient was cast for custom molded orthotics by our Pedorthist -In the meantime while the custom orthotics are being made and fabricated OTC prefabricated power step insoles were dispensed -Return to clinic for orthotics pickup     Thresa EMERSON Sar, DPM [facility]  Dr. Thresa EMERSON Sar, DPM    [address] [place], [place] [address]                Office [phone]  Fax [phone]

## 2024-08-02 ENCOUNTER — Other Ambulatory Visit: Payer: Self-pay | Admitting: Gastroenterology

## 2024-08-03 ENCOUNTER — Other Ambulatory Visit: Payer: Self-pay | Admitting: Gastroenterology

## 2024-08-03 ENCOUNTER — Encounter: Payer: Self-pay | Admitting: Internal Medicine

## 2024-08-11 ENCOUNTER — Ambulatory Visit (INDEPENDENT_AMBULATORY_CARE_PROVIDER_SITE_OTHER): Admitting: Family Medicine

## 2024-08-11 ENCOUNTER — Ambulatory Visit: Payer: Self-pay

## 2024-08-11 ENCOUNTER — Telehealth: Payer: Self-pay | Admitting: Physician Assistant

## 2024-08-11 VITALS — BP 136/80 | HR 70 | Temp 97.5°F | Ht <= 58 in | Wt 159.4 lb

## 2024-08-11 DIAGNOSIS — R42 Dizziness and giddiness: Secondary | ICD-10-CM

## 2024-08-11 DIAGNOSIS — R110 Nausea: Secondary | ICD-10-CM

## 2024-08-11 DIAGNOSIS — R21 Rash and other nonspecific skin eruption: Secondary | ICD-10-CM | POA: Diagnosis not present

## 2024-08-11 DIAGNOSIS — R5383 Other fatigue: Secondary | ICD-10-CM

## 2024-08-11 DIAGNOSIS — R079 Chest pain, unspecified: Secondary | ICD-10-CM

## 2024-08-11 NOTE — Progress Notes (Signed)
 Established Patient Office Visit   Subjective  Patient ID: Beth Huffman, female    DOB: 1961-07-08  Age: 63 y.o. MRN: [id number]  Chief Complaint  Patient presents with   Acute Visit    Dizziness nausea clammy, left side chest pain and tightness and shoulder pain, and bilateral leg rash   Pt accompanied by her husband.  Pt is a 63 yo female with pmh sig for atrial tachycardia, palpitations, HTN, h/o CP s/p LHC 2019 and 2023, HDL, syringomyelia who is  followed by Dr. Garald and seen for acute concern.    Pt began feeling unwell around 2 PM yesterday with dizziness and presyncope while driving home from a meeting with friends. Upon arriving home, she felt nauseous and clammy.  Pt also felt chest tightness, L shoulder pain.  Unsure of SOB.  Symptoms continued causing her to get in bed where she remained for the night.  Endorses decreased appetite.  Took Zofran , which alleviated the nausea.    Today feeling fatigued.  Also had an episode of flutters x 30 min. She has a history of tachycardia. Takes verapamil  at bedtime for her condition.  Additionally, she noticed a rash on her leg while showering yesterday, which she describes as possibly unrelated to her other symptoms.     Patient Active Problem List   Diagnosis Date Noted   Sensory deficit present 07/07/2024   Osteoarthritis 07/06/2024   Anesthesia in both legs 07/06/2024   Finger pain, left 06/05/2024   Trochanteric bursitis of left hip 02/29/2024   Elbow pain, chronic, right 06/09/2022   Blurry vision 04/23/2022   Allergic rhinitis 01/21/2022   Chronic allergic conjunctivitis 01/21/2022   Local anesthetic drug adverse reaction 01/21/2022   Vasomotor rhinitis 01/21/2022   Mediastinal mass 01/21/2022   Menopause 10/30/2021   Grief 07/31/2021   Neoplasm of uncertain behavior of skin 07/31/2021   Paresthesias 07/31/2021   Intertriginous candidiasis 01/24/2021   Insomnia 10/30/2020   Leg swelling 12/02/2019    Dyslipidemia 10/31/2019   Sore throat 01/21/2019   Primary osteoarthritis of left knee 08/23/2018   Atrial tachycardia 06/08/2018   Chest discomfort 05/07/2018   Dyspnea 05/07/2018   Migraine headache with aura 10/28/2017   Rash 07/27/2017   Tonsillitis, chronic 02/26/2017   Postoperative state 07/24/2016   Onychomycosis 01/30/2016   Partial thickness burn of left wrist 09/11/2015   Elevated BP without diagnosis of hypertension 08/23/2014   Palpitations 06/21/2014   Well adult exam 12/21/2013   Syncope, near 06/30/2013   Orthostatic hypotension 06/30/2013   Contact dermatitis 03/16/2013   Constipation 09/08/2012   Arachnoid cyst 08/19/2011   Nausea & vomiting 08/19/2011   Hyperglycemia 08/19/2011   Right knee pain 03/04/2011   Elevated liver enzymes 03/04/2011   LUQ PAIN 05/23/2010   Weight gain 09/28/2009   NONSPECIFIC ABNORMAL RESULTS LIVR FUNCTION STUDY 09/28/2009   Migraine headache 06/14/2009   NECK PAIN 06/14/2009   Nausea without vomiting 06/14/2009   Female stress incontinence 02/06/2009   Acute sinusitis 12/05/2008   PARESTHESIA 07/13/2008   OTITIS EXTERNA 05/10/2008   URINARY TRACT INFECTION (UTI) 03/23/2008   Headache 12/20/2007   GERD 09/29/2007   LOW BACK PAIN 09/29/2007   FEVER UNSPECIFIED 09/29/2007   SYRINGOMYELIA 08/20/2007   Past Medical History:  Diagnosis Date   Abnormal weight gain    Acute sinusitis 12/05/2008   1/18 8/18   Allergy 2005   During decompression surgery   Arachnoid cyst 08/19/2011   Arthritis 2010   Hands,  Legs, Feet   Asthma 2023   Dr. Meade, Pulmonologist diagnosed   Chest tightness or pressure 05/07/2018   Constipation 09/08/2012   Chronic - opioid related 11/17 Amitiza    Contact dermatitis 03/16/2013   5/14 relapsing - poison ivy    Dyspnea 05/07/2018   Elevated liver enzymes 03/04/2011   Chronic off and on    Female stress incontinence 02/06/2009   Qualifier: Diagnosis of  By: Plotnikov MD, Karlynn [name]    FEVER  UNSPECIFIED 09/29/2007   Qualifier: Diagnosis of  By: Garald MD, Karlynn [name]    GERD 09/29/2007   Chronic  Protonix  prn  Potential benefits of a long term PPI use as well as potential risks  and complications were explained to the patient and were aknowledged.     GERD (gastroesophageal reflux disease)    Headache 12/20/2007   Dr Gladis Migraines (1 per 2 wks) Topamax  400 mg/d; Imitrex  prn; Ibuprofen  prn, Zofran  prn    Hyperglycemia 08/19/2011   Mild     Hypertension    Increased blood pressure (not hypertension) 08/23/2014   Intractable migraine 06/14/2009   Chronic Ibuprofen , Imitrex , Topamax ,  Nortriptyline  - intolerant   Knee pain 03/04/2011   R knee 2018 L knee pain - L knee b anserina and L knee is tender w/ROM: L knee pain is worse - patellofemoral syndrome vs other   LBP (low back pain)    Local anesthetic drug adverse reaction 01/21/2022   LOW BACK PAIN 09/29/2007   Chronic, lately (2016) disabling Due to syringomyelia On Oxycodone   Potential benefits of a long term opioids use as well as potential risks (i.e. addiction risk, apnea etc) and complications (i.e. Somnolence, constipation and others) were explained to the patient and were aknowledged.     LUQ PAIN 05/23/2010   Qualifier: Diagnosis of  By: Plotnikov MD, Karlynn [name]    Migraine, chronic, without aura, intractable 10/28/2017   Botox  approved diagnosis. 7/19 Worse. Start Emagility inj. Relpax    Migraines    Myocardial infarction (HCC) 2010   Tachycardia   Nausea & vomiting 08/19/2011   Nausea without vomiting 06/14/2009   Due to migraines     NECK PAIN 06/14/2009    Chronic, lately (2016) disabling Due to syringomyelia On Oxycodone   Potential benefits of a long term opioids use as well as potential risks (i.e. addiction risk, apnea etc) and complications (i.e. Somnolence, constipation and others) were explained to the patient and were aknowledged.     NONSPECIFIC ABNORMAL RESULTS LIVR FUNCTION STUDY 09/28/2009    Qualifier: Diagnosis of  By: Garald MD, Karlynn [name]    OBSESSIVE-COMPULSIVE DISORDER 05/23/2010   Chronic - compulsive shopping    Onychomycosis 01/30/2016   4/17    Orthostatic hypotension 06/30/2013   9/14 likely Rapaflo induced    OTITIS EXTERNA 05/10/2008   Qualifier: Diagnosis of  By: Garald MD, Karlynn [name]    Palpitations 06/21/2014   PARESTHESIA 07/13/2008   Qualifier: Diagnosis of  By: Garald MD, Karlynn [name]    Partial thickness burn of left wrist 09/11/2015   Postoperative state 07/24/2016   Pyelonephritis 2008   Rash 07/27/2017   Stress 2009   Syncope, near 06/30/2013   9/14 likely Rapaflo induced 12/14 relapsing off Rapaflo x 7 2/15 no relapsing since on 1/2 dose of Topamax     Syringobulbia [facility])    SYRINGOMYELIA 08/20/2007   Chronic Dr Onita Dr Homer at [facility] Chronic pain    Syringomyelia (HCC)    Tachycardia    Tonsillitis,  chronic 02/26/2017   2018 worse   URINARY TRACT INFECTION (UTI) 03/23/2008   Qualifier: Diagnosis of  By: Plotnikov MD, Karlynn [name]    UTI (lower urinary tract infection)    Well adult exam 12/21/2013   We discussed age appropriate health related issues, including available/recomended screening tests and vaccinations. We discussed a need for adhering to healthy diet and exercise. Labs/EKG were reviewed/ordered. All questions were answered.      Past Surgical History:  Procedure Laterality Date   ABDOMINAL HYSTERECTOMY     ANTERIOR AND POSTERIOR REPAIR N/A 07/24/2016   Procedure: Possible ANTERIOR (CYSTOCELE) repair, perineoplasty;  Surgeon: Percilla Burly, MD;  Location: [facility];  Service: Gynecology;  Laterality: N/A;   BLADDER SUSPENSION N/A 07/24/2016   Procedure: TRANSVAGINAL TAPE (TVT) PROCEDURE;  Surgeon: Percilla Burly, MD;  Location: [facility];  Service: Gynecology;  Laterality: N/A;   BRAIN SURGERY  2005   Decompression   COLONOSCOPY  10/22/2016   Elspeth Naval at [facility], stool in colon, 1-2 yr recall   COLONOSCOPY  2005   Dr  Debrah, F/V, no polyps, normal except diverticulosis   CYSTOSCOPY N/A 07/24/2016   Procedure: CYSTOSCOPY;  Surgeon: Percilla Burly, MD;  Location: [facility];  Service: Gynecology;  Laterality: N/A;   FOOT SURGERY Right 2023   FOOT SURGERY Right 2023   Scraped top of foot   intracranial decompression surgery  2006   JOINT REPLACEMENT  2010?   Left Knee   LEFT HEART CATH AND CORONARY ANGIOGRAPHY N/A 05/07/2018   Procedure: LEFT HEART CATH AND CORONARY ANGIOGRAPHY;  Surgeon: Claudene Victory ORN, MD;  [facility];  Service: Cardiovascular;  Laterality: N/A;   PARTIAL KNEE ARTHROPLASTY Left 09/07/2018   Procedure: UNICOMPARTMENTAL LEFT KNEE;  Surgeon: Beverley Evalene BIRCH, MD;  Location: [facility];  Service: Orthopedics;  Laterality: Left;  Adductor Block   ROBOTIC ASSISTED LAPAROSCOPIC SACROCOLPOPEXY N/A 07/24/2016   Procedure: ROBOTIC ASSISTED LAPAROSCOPIC SACROCOLPOPEXY WITH PERINEOPLASTY;  Surgeon: Percilla Burly, MD;  Location[facility];  Service: Gynecology;  Laterality: N/A;   TUBAL LIGATION     Social History   Tobacco Use   Smoking status: Never    Passive exposure: Past   Smokeless tobacco: Never  Vaping Use   Vaping status: Never Used  Substance Use Topics   Alcohol use: No   Drug use: Yes    Types: Oxycodone    Family History  Problem Relation Age of Onset   Colon polyps Mother    COPD Mother    Peripheral vascular disease Mother    Hypertension Mother    Heart disease Mother    Arthritis Mother    Vision loss Mother    Hypertension Father    Arthritis Father    Stroke Father    Vision loss Father    Breast cancer Maternal Aunt    Colon cancer Maternal Uncle 60   Heart attack Maternal Grandmother    Diabetes Maternal Grandmother    Stroke Maternal Grandfather    Stroke Paternal Grandfather    Asthma Granddaughter    Anxiety disorder Other    ADD / ADHD Daughter    Asthma Daughter    Esophageal cancer Neg Hx    Pancreatic cancer Neg Hx    Stomach  cancer Neg Hx    Allergies  Allergen Reactions   Atenolol  Other (See Comments)    Wt gain   Cefuroxime Axetil Nausea Only and Other (See Comments)    REACTION: nausea - but tolerates PCN  Codeine Sulfate Nausea And Vomiting and Nausea Only   Fentanyl  Nausea And Vomiting and Other (See Comments)    REACTION: bad reaction   Imdur  [Isosorbide  Nitrate] Other (See Comments)    Headaches   Iodinated Contrast Media Rash   Nitrofurantoin Other (See Comments)    Unknown   Pregabalin Other (See Comments)    REACTION: cp   Rapaflo [Silodosin] Other (See Comments)    Orthostatic BP drop, near syncope    Topamax  [Topiramate ] Other (See Comments)    Tight chest    Tramadol Other (See Comments)   Tramadol Hcl Nausea And Vomiting    REACTION: sick    ROS Negative unless stated above    Objective:     BP 136/80 (BP Location: Left Arm, Patient Position: Sitting, Cuff Size: Normal)   Pulse 70   Temp (!) 97.5 F (36.4 C) (Oral)   Ht 1' (0.305 m)   Wt 159 lb 6.4 oz (72.3 kg)   SpO2 97%   BMI 778.27 kg/m  BP Readings from Last 3 Encounters:  08/11/24 136/80  07/20/24 135/76  07/06/24 126/82   Wt Readings from Last 3 Encounters:  08/11/24 159 lb 6.4 oz (72.3 kg)  07/20/24 152 lb (68.9 kg)  07/06/24 152 lb (68.9 kg)      Physical Exam Constitutional:      General: She is not in acute distress.    Appearance: Normal appearance.  HENT:     Head: Normocephalic and atraumatic.     Nose: Nose normal.     Mouth/Throat:     Mouth: Mucous membranes are moist.  Cardiovascular:     Rate and Rhythm: Normal rate and regular rhythm.     Heart sounds: Normal heart sounds. No murmur heard.    No gallop.  Pulmonary:     Effort: Pulmonary effort is normal. No respiratory distress.     Breath sounds: Normal breath sounds. No wheezing, rhonchi or rales.  Skin:    General: Skin is warm and dry.  Neurological:     Mental Status: She is alert and oriented to person, place, and time.         03/28/2024    9:36 AM 10/01/2023   10:18 AM 02/18/2023    9:16 AM  Depression screen PHQ 2/9  Decreased Interest 0 0 0  Down, Depressed, Hopeless 0 0 0  PHQ - 2 Score 0 0 0  Altered sleeping 0    Tired, decreased energy 0    Change in appetite 0    Feeling bad or failure about yourself  0    Trouble concentrating 0    Moving slowly or fidgety/restless 0    Suicidal thoughts 0    PHQ-9 Score 0    Difficult doing work/chores Not difficult at all      No results found for any visits on 08/11/24.    Assessment & Plan:   Chest pain, unspecified type -     EKG 12-Lead  Fatigue, unspecified type  Rash  Dizziness  Nausea  Acute symptoms of CP, fatigue, dizziness, n, L arm pain, clamminess concerning for cardiac etiology.  Also consider PE, PNA, anxiety, etc.  VS stable.  EKG obtained in clinic with NSR, no ST depression, ST elevation, or T wave inversion.  Concern for RBBB with R R' in v1, though no wide S wave in v 6.  Previous studies viewed for comparison.   Of note xray not available in clinic. Given sx, pt advised  to proceed to nearest ED for trpn, d-dimer, etc.  Pt initially declines and mentions she could have seen her Cardiologist.  While this provider agrees, encourage to proceed to ED.  Updated ECHO advised, as last done 2019.   No follow-ups on file.   Clotilda JONELLE Single, MD

## 2024-08-11 NOTE — Telephone Encounter (Signed)
 STAT if patient feels like he/she is going to faint   Are you dizzy, lightheaded, or faint now? No  Have you passed out? No IF YES MOVE TO .SYNCOPECVD  Do you have any other symptoms? Just doesn't feel right   Have you checked your HR and BP (record if available)? BP: 149/89

## 2024-08-11 NOTE — Telephone Encounter (Signed)
 Yesterday 2 pm driving home she felt reallly weak, clammy, nauseated for several hours, had chest tightness and then later in the evening had shoulder pain and was hurting all last night. She did not feel right all night and was in the bed Today, she does not have any energy. Still does not feel right. She went to see Dr Mercer today and they did an EKG and told her to go to the ER.  She does not want to go to the ER. Explained to her that all of the symptoms that she described are suspect of heart attack. Her EKG showed changes from the previous one (she just read off what was on the top- right atrial enlargement and RBBB)  Encouraged her to go to the ER to be evaluated for possible heart attack. Time= heart muscle. The quicker she can be evaluated, the better. The ER can get lab work and run tests that we cannot at the office. Asked her to call for f/u appointment after she is evaluated at the ER.   She agreed to go to the ER for evaluation.

## 2024-08-11 NOTE — Telephone Encounter (Signed)
 FYI Only or Action Required?: Action required by provider: Pt requesting EKG at appt.  Patient was last seen in primary care on 07/06/2024 by Plotnikov, Karlynn [name], MD.  Called Nurse Triage reporting Fatigue, Dizziness, and Chest Pain.  Symptoms began yesterday.  Interventions attempted: Prescription medications: Zofran .  Symptoms are: gradually improving.  Triage Disposition: Go to ED Now (or PCP Triage)  Patient/caregiver understands and will follow disposition?: NoCopied from CRM #[id number]. Topic: Clinical - Red Word Triage >> Aug 11, 2024 12:27 PM Mia F wrote: Red Word that prompted transfer to Nurse Triage: Rash that is on both legs. Have been dizzy, nauseous, and overall not feeling well. Yesterday bp was elevated. Has been going on about 1pm yesterday. Today seems to be getting worse. Reason for Disposition  [1] Chest pain lasts > 5 minutes AND [2] occurred in past 3 days (72 hours) (Exception: Feels exactly the same as previously diagnosed heartburn and has accompanying sour taste in mouth.)  Answer Assessment - Initial Assessment Questions 1. DESCRIPTION: Describe how you are feeling.     Onset of dizziness, nausea and  feeling sweaty and clammy yesterday. Today just feels fatigued.  2. SEVERITY: How bad is it?  Can you stand and walk?     Moderate  3. ONSET: When did these symptoms begin? (e.g., hours, days, weeks, months)     Yesterday while driving home  4. CAUSE: What do you think is causing the weakness or fatigue? (e.g., not drinking enough fluids, medical problem, trouble sleeping)     Maybe prednisone .  5. NEW MEDICINES:  Have you started on any new medicines recently? (e.g., opioid pain medicines, benzodiazepines, muscle relaxants, antidepressants, antihistamines, neuroleptics, beta blockers)     Recently started on 5 mg prednisone  on 9/10 for hand arthritis prn. Pt reports taking it every few days.  6. OTHER SYMPTOMS: Do you have any other symptoms?  (e.g., chest pain, fever, cough, SOB, vomiting, diarrhea, bleeding, other areas of pain)     Flat splotchy red rash on BLE 3 days ago without pain or itching. BP 147/90 yesterday. BP 157/87, HR 78 today.  Answer Assessment - Initial Assessment Questions Pt declines ED or UC, asking for office appt and if they have EKG capabilites. Called [facility] at [facility] and spoke with Madeline, reports they can do EKG. Appt scheduled for today.  1. LOCATION: Where does it hurt?       Center chest  2. RADIATION: Does the pain go anywhere else? (e.g., into neck, jaw, arms, back)     Left shoulder  3. ONSET: When did the chest pain begin? (Minutes, hours or days)      Yesterday for about 45 minutes.  4. PATTERN: Does the pain come and go, or has it been constant since it started?  Does it get worse with exertion?      Only occurred once  5. DURATION: How long does it last (e.g., seconds, minutes, hours)     45 minutes  6. SEVERITY: How bad is the pain?  (e.g., Scale 1-10; mild, moderate, or severe)     No pain, just heaviness. Felt like she had pulled something in shoulder  7. CARDIAC RISK FACTORS: Do you have any history of heart problems or risk factors for heart disease? (e.g., angina, prior heart attack; diabetes, high blood pressure, high cholesterol, smoker, or strong family history of heart disease)     Hx of tachycardia  8. PULMONARY RISK FACTORS: Do you have any history of  lung disease?  (e.g., blood clots in lung, asthma, emphysema, birth control pills)     Asthma  9. CAUSE: What do you think is causing the chest pain?     Unsure  10. OTHER SYMPTOMS: Do you have any other symptoms? (e.g., dizziness, nausea, vomiting, sweating, fever, difficulty breathing, cough)       Dizziness, nausea, sweating  Protocols used: Weakness (Generalized) and Fatigue-A-AH, Chest Pain-A-AH

## 2024-08-12 ENCOUNTER — Other Ambulatory Visit: Payer: Self-pay

## 2024-08-12 ENCOUNTER — Emergency Department (HOSPITAL_COMMUNITY)
Admission: EM | Admit: 2024-08-12 | Discharge: 2024-08-12 | Disposition: A | Discharge status: Home / Self Care | Attending: Emergency Medicine | Admitting: Emergency Medicine

## 2024-08-12 ENCOUNTER — Emergency Department (HOSPITAL_COMMUNITY)

## 2024-08-12 DIAGNOSIS — K219 Gastro-esophageal reflux disease without esophagitis: Secondary | ICD-10-CM | POA: Insufficient documentation

## 2024-08-12 DIAGNOSIS — Z79899 Other long term (current) drug therapy: Secondary | ICD-10-CM | POA: Diagnosis not present

## 2024-08-12 DIAGNOSIS — R61 Generalized hyperhidrosis: Secondary | ICD-10-CM | POA: Diagnosis not present

## 2024-08-12 DIAGNOSIS — I10 Essential (primary) hypertension: Secondary | ICD-10-CM | POA: Insufficient documentation

## 2024-08-12 DIAGNOSIS — R42 Dizziness and giddiness: Secondary | ICD-10-CM | POA: Diagnosis not present

## 2024-08-12 DIAGNOSIS — R0789 Other chest pain: Secondary | ICD-10-CM | POA: Diagnosis not present

## 2024-08-12 DIAGNOSIS — R079 Chest pain, unspecified: Secondary | ICD-10-CM | POA: Diagnosis not present

## 2024-08-12 DIAGNOSIS — R0602 Shortness of breath: Secondary | ICD-10-CM | POA: Diagnosis not present

## 2024-08-12 DIAGNOSIS — R110 Nausea: Secondary | ICD-10-CM | POA: Diagnosis not present

## 2024-08-12 LAB — BASIC METABOLIC PANEL WITH GFR
Anion gap: 12 (ref 5–15)
BUN: 13 mg/dL (ref 8–23)
CO2: 23 mmol/L (ref 22–32)
Calcium: 9.6 mg/dL (ref 8.9–10.3)
Chloride: 100 mmol/L (ref 98–111)
Creatinine, Ser: 0.87 mg/dL (ref 0.44–1.00)
GFR, Estimated: >60 mL/min (ref >60)
Glucose, Bld: 94 mg/dL (ref 70–99)
Potassium: 4 mmol/L (ref 3.5–5.1)
Sodium: 135 mmol/L (ref 135–145)

## 2024-08-12 LAB — CBC
HCT: 38.9 % (ref 36.0–46.0)
Hemoglobin: 12.6 g/dL (ref 12.0–15.0)
MCH: 30.6 pg (ref 26.0–34.0)
MCHC: 32.4 g/dL (ref 30.0–36.0)
MCV: 94.4 fL (ref 80.0–100.0)
Platelets: 296 K/uL (ref 150–400)
RBC: 4.12 MIL/uL (ref 3.87–5.11)
RDW: 12.4 % (ref 11.5–15.5)
WBC: 4.9 K/uL (ref 4.0–10.5)
nRBC: 0 % (ref 0.0–0.2)

## 2024-08-12 LAB — D-DIMER, QUANTITATIVE: D-Dimer, Quant: 0.41 ug{FEU}/mL (ref 0.00–0.50)

## 2024-08-12 LAB — BRAIN NATRIURETIC PEPTIDE: B Natriuretic Peptide: 10.8 pg/mL (ref 0.0–100.0)

## 2024-08-12 LAB — TROPONIN I (HIGH SENSITIVITY): Troponin I (High Sensitivity): 3 ng/L (ref <18)

## 2024-08-12 NOTE — ED Notes (Signed)
 Extra SST, Blue & Red top drawn

## 2024-08-12 NOTE — ED Provider Notes (Signed)
[facility] AT [facility] Provider Note   CSN: [id number] Arrival date & time: 08/12/24  9194     Patient presents with: Chest Pain, Dizziness, sweaty, and Nausea   Beth Huffman is a 63 y.o. female with past medical history of migraines, GERD, hypertension presenting to emergency room with complaint of chest pain that occurred and resolved two days ago. Patient reports she was driving and had a few minutes of chest pain radiating to left shoulder and some shortness of breath, when this resolved, started having nausea, yesterday she was tired and had nausea as well, now symptoms are gone and she feels back to normal. Has not noted any CP or SOB with exertion. She does see cardiology. She did not have to use nitroglycerin .   Had stress test two years ago with no ischemia, normal EF 6 years ago. Saw cardiology March of this year.     Chest Pain Associated symptoms: no dizziness   Dizziness Associated symptoms: chest pain        Prior to Admission medications   Medication Sig Start Date End Date Taking? Authorizing Provider  botulinum toxin Type A  (BOTOX ) 200 units injection Inject 155 units IM into multiple site in the face,neck and head once every 90 days 02/04/24   Skeet Juliene SAUNDERS, DO  cholecalciferol (VITAMIN D ) 1000 UNITS tablet Take 1,000 Units by mouth daily.    [provider]  cyclobenzaprine  (FLEXERIL ) 5 MG tablet TAKE 1 TABLET THREE TIMES DAILY AS NEEDED FOR MUSCLE SPASMS. 07/28/24   Plotnikov, Karlynn [name], MD  Erenumab -aooe (AIMOVIG ) 140 MG/ML SOAJ Inject 140 mg into the skin every 28 (twenty-eight) days. 05/02/24   Skeet Juliene SAUNDERS, DO  estradiol  (ESTRACE ) 0.5 MG tablet Take 1 tablet (0.5 mg total) by mouth daily. 06/05/24   Plotnikov, Aleksei V, MD  fluticasone  furoate-vilanterol (BREO ELLIPTA ) 100-25 MCG/ACT AEPB Inhale 1 puff into the lungs daily. 02/03/24   Meade Verdon RAMAN, MD  linaclotide  (LINZESS ) 290 MCG CAPS capsule Take 1 capsule (290  mcg total) by mouth daily before breakfast. TAKE 30 MINUTES BEFORE A MEAL 08/03/24   Armbruster, Elspeth SQUIBB, MD  Multiple Vitamin (MULTIVITAMIN PO) Take 1 tablet by mouth daily.    [provider]  naproxen  (NAPROSYN ) 500 MG tablet TAKE ONE TABLET BY MOUTH TWICE DAILY WITH A MEAL 01/28/24   Plotnikov, Aleksei V, MD  nitroGLYCERIN  (NITROSTAT ) 0.4 MG SL tablet Place 1 tablet (0.4 mg total) under the tongue every 5 (five) minutes as needed for chest pain. 12/21/23   Fernande Elspeth BROCKS, MD  ondansetron  (ZOFRAN ) 8 MG tablet TAKE ONE TABLET BY MOUTH EVERY 8 HOURS AS NEEDED FOR NAUSEA OR FOR VOMITING 10/04/23   Plotnikov, Aleksei V, MD  Oxycodone  HCl 10 MG TABS Take 1 tablet (10 mg total) by mouth every 6 (six) hours as needed. 07/06/24   Plotnikov, Aleksei V, MD  Oxycodone  HCl 10 MG TABS Take 1 tablet (10 mg total) by mouth every 6 (six) hours as needed. 07/06/24   Plotnikov, Aleksei V, MD  Oxycodone  HCl 10 MG TABS Take 1 tablet (10 mg total) by mouth every 6 (six) hours as needed. 07/06/24   Plotnikov, Aleksei V, MD  oxymetazoline  (AFRIN NASAL SPRAY) 0.05 % nasal spray Place 1 spray into both nostrils 2 (two) times daily. 04/02/23   Plotnikov, Karlynn [name], MD  pantoprazole  (PROTONIX ) 40 MG tablet Take 1 tablet (40 mg total) by mouth 2 (two) times daily before a meal. 07/04/24  Leigh Elspeth SQUIBB, MD  polyethylene glycol (MIRALAX  / GLYCOLAX ) packet Take 8.5 g by mouth every other day.    [provider]  predniSONE  (DELTASONE ) 5 MG tablet Take 1-2 tablets (5-10 mg total) by mouth daily with breakfast. 07/06/24   Plotnikov, Aleksei V, MD  rizatriptan  (MAXALT ) 10 MG tablet TAKE 1 TABLET AT ONSET OF MIGRAINE- MAY REPEAT ONCE IN 2 HOURS. LIMIT 2/24 HOURS. AVOID DAILY USE. 04/25/24   Plotnikov, Aleksei V, MD  senna (SENOKOT) 8.6 MG tablet Take 1 tablet by mouth as needed for constipation.    [provider]  verapamil  (CALAN -SR) 120 MG CR tablet TAKE ONE TABLET BY MOUTH AT BEDTIME 05/05/24   Fernande Elspeth BROCKS, MD    Allergies: Atenolol , Cefuroxime axetil, Codeine sulfate, Fentanyl , Imdur  [isosorbide  nitrate], Iodinated contrast media, Nitrofurantoin, Pregabalin, Rapaflo [silodosin], Topamax  [topiramate ], Tramadol, and Tramadol hcl    Review of Systems  Cardiovascular:  Positive for chest pain.  Neurological:  Negative for dizziness.    Updated Vital Signs BP 134/82   Pulse 76   Temp 97.9 F (36.6 C) (Oral)   Resp 17   SpO2 95%   Physical Exam Vitals and nursing note reviewed.  Constitutional:      General: She is not in acute distress.    Appearance: She is not toxic-appearing.  HENT:     Head: Normocephalic and atraumatic.  Eyes:     General: No scleral icterus.    Conjunctiva/sclera: Conjunctivae normal.  Cardiovascular:     Rate and Rhythm: Normal rate and regular rhythm.     Pulses: Normal pulses.     Heart sounds: Normal heart sounds.  Pulmonary:     Effort: Pulmonary effort is normal. No respiratory distress.     Breath sounds: Normal breath sounds.  Abdominal:     General: Abdomen is flat. Bowel sounds are normal.     Palpations: Abdomen is soft.     Tenderness: There is no abdominal tenderness.  Skin:    General: Skin is warm and dry.     Findings: No lesion.  Neurological:     General: No focal deficit present.     Mental Status: She is alert and oriented to person, place, and time. Mental status is at baseline.     (all labs ordered are listed, but only abnormal results are displayed) Labs Reviewed  BASIC METABOLIC PANEL WITH GFR  CBC  D-DIMER, QUANTITATIVE  BRAIN NATRIURETIC PEPTIDE  TROPONIN I (HIGH SENSITIVITY)    EKG: EKG Interpretation Date/Time:  Friday August 12 2024 08:15:44 EDT Ventricular Rate:  95 PR Interval:  128 QRS Duration:  94 QT Interval:  338 QTC Calculation: 424 R Axis:   51  Text Interpretation: Normal sinus rhythm Normal ECG When compared with ECG of 21-Jun-2024 13:33, PREVIOUS ECG IS PRESENT Confirmed by Darra Chew  [phone]) on 08/12/2024 9:08:20 AM  Radiology: [facility] Chest 2 View Result Date: 08/12/2024 EXAM: 2 VIEW(S) XRAY OF THE CHEST 08/12/2024 08:41:00 AM COMPARISON: None available. CLINICAL HISTORY: Chest pain. Per triage notes: Pt. Stated, 2 days ago I was driving and I started having chest tightness, nausea, and sweats. It was with me for the rest of day. I saw Dr yesterday and she suggested I come here for a Echo cardiogram. I had some chest tightness and felt some heart fluttering yesterday. Right now I'm having some chest heaviness. I'm always SOB. FINDINGS: LUNGS AND PLEURA: No focal pulmonary opacity. No pulmonary edema. No pleural effusion. No pneumothorax. HEART  AND MEDIASTINUM: No acute abnormality of the cardiac and mediastinal silhouettes. BONES AND SOFT TISSUES: No acute osseous abnormality. IMPRESSION: 1. No acute process. Electronically signed by: Lynwood Seip MD 08/12/2024 08:52 AM EDT RP Workstation: [id number]     Procedures   Medications Ordered in the ED - No data to display                                  Medical Decision Making Amount and/or Complexity of Data Reviewed Labs: ordered. Radiology: ordered.   This patient presents to the ED for concern of chest pain, this involves an extensive number of treatment options, and is a complaint that carries with it a high risk of complications and morbidity.  The differential diagnosis includes ACS, stable angina, CHF, pneumonia, pneumothorax, aortic dissection, pulmonary embolus    Co morbidities that complicate the patient evaluation  GERD, HTN   Additional history obtained:  Additional history obtained from reviewed patient's office visit yesterday with chest pain recommended coming to ER for troponins and D-dimer   Lab Tests:  I personally interpreted labs.  The pertinent results include:   CBC, BMP unremarkable D-dimer 0.41 BNP within normal limit Troponin 3 within normal limits duration of symptoms will not obtain  repeat.    Imaging Studies ordered:  I ordered imaging studies including chest x-ray I independently visualized and interpreted imaging which showed no acute findings.  I agree with the radiologist interpretation   Cardiac Monitoring: / EKG:  The patient was maintained on a cardiac monitor.  I personally viewed and interpreted the cardiac monitored which showed an underlying rhythm of: normal sinus    Problem List / ED Course / Critical interventions / Medication management  Patient presented to emergency room with chest pain that occurred 2 days ago and is now resolved.  On arrival she is hemodynamically stable and she is well-appearing.  She has not had any recent ischemic workup with cardiology nor echo in the last 2 years, but these were reassuring at that time.  She does not note any exertional component to chest pain or shortness of breath.  She has no sign of fluid overload on exam and her BNP is within normal limits.  I did obtain D-dimer which is within normal limits thus doubt DVT or PE as cause of chest pain.  Her troponin is within normal limits and EKG shows normal sinus rhythm.  Chest x-ray shows no acute findings.  I will refer her to cardiology for further workup, but given that she is feeling well today and is chest pain feel feel okay for OP workup.  Symptoms gone prior to arrival to ER today, so no medications give.  I have reviewed the patients home medicines and have made adjustments as needed. Patient has been hemodynamically stable throughout stay. Low HEART score at this time.  Feel appropriate for discharge with close outpatient follow-up.      Final diagnoses:  Chest pain, unspecified type    ED Discharge Orders          Ordered    Ambulatory referral to Cardiology       Comments: If you have not heard from the Cardiology office within the next 72 hours please call [phone].   08/12/24 1045               Christine Morton, Warren SAILOR, PA-C 08/12/24  1048    Long, Fonda MATSU, MD  08/13/24 0806  

## 2024-08-12 NOTE — ED Triage Notes (Addendum)
 Pt. Stated, 2 days ago I was driving and I started having chest tightness, nausea, and sweats. It was with me for the rest of day. I saw Dr yesterday and she suffest I come here for a Echo cardiogram. I had some chest tightness and felt some heart fluttering yesterday.. Right now Im having some chest heaviness . Im always SOB

## 2024-08-12 NOTE — Telephone Encounter (Signed)
Pt reported to ED this morning.

## 2024-08-12 NOTE — Discharge Instructions (Signed)
 Please follow-up with cardiology, they should call you to schedule appointment.  Return to ER with new or worsening symptoms.

## 2024-08-22 ENCOUNTER — Other Ambulatory Visit: Payer: Self-pay | Admitting: Medical Genetics

## 2024-08-22 DIAGNOSIS — Z006 Encounter for examination for normal comparison and control in clinical research program: Secondary | ICD-10-CM

## 2024-08-24 ENCOUNTER — Telehealth: Payer: Self-pay | Admitting: Neurology

## 2024-08-24 NOTE — Telephone Encounter (Signed)
 Did you rcv [employer] note about Botox  and if so can you update her on next steps, you can call,txt or mychart

## 2024-08-24 NOTE — Telephone Encounter (Signed)
 LMOVM patient, No paperwork received yet from [facility]. If it is need of PA we are aware it expires 08/2024. We will renew it after her F/u appt with [facility] 08/31/24 so it will have up to date information on the patient is doing on the Botox .

## 2024-08-30 ENCOUNTER — Telehealth: Payer: Self-pay

## 2024-08-30 ENCOUNTER — Other Ambulatory Visit: Payer: Self-pay

## 2024-08-30 NOTE — Telephone Encounter (Signed)
 Pt called stating that [facility] but the person did not leave a message.  Reviewed pt's chart but there was no message in the pt's chart of who called the pt.  Informed pt of the this information.  Pt requested that if this nurse would place a message in the pt's chart to NOT Send her MyChart messages.

## 2024-08-30 NOTE — Progress Notes (Unsigned)
 NEUROLOGY FOLLOW UP OFFICE NOTE  Beth Huffman [id number]  Assessment/Plan:   Migraine without aura, without status migrainosus, not intractable. - improved on Botox  with Aimovig     1.  Migraine prevention:  Plan is to continue Botox  and Aimovig  140mg .  If continuing Botox  is not possible, plan would be to switch from Aimovig  to Vyepti 300mg  every 3 months. 2.  Migraine rescue:  Rizatriptan  10mg .  May use naproxen  as well.   3.  Limit use of pain relievers to no more than 2 days out of week to prevent risk of rebound or medication-overuse headache. 4.  Keep headache diary 5.  Follow up for Botox   Total time spent in chart and in person with patient:  35 minutes.   Subjective:  Beth Huffman is a 63 year old right-handed Caucasian woman who follows up for migraines.  She is accompanied by her husband.   UPDATE: She has had 2 treatments of Botox .   She has demonstrated improvement. Intensity:  severe Duration:  1 hour with rizatriptan  but migraine returns next day (but only gets 8 a month and occurs 3 consecutive days); Tried Nurtec samples - she thought it caused headaches.  Frequency: 10 a month  However, we have had a lot of trouble getting approval and coverage from her insurance.    Current NSAIDS:  naproxen  (take with triptan) Current analgesics:  oxycodone  Current triptans:  Maxalt  10mg   Current ergotamine:  none Current anti-emetic:  Zofran  8mg  Current muscle relaxants:  Flexeril  5mg  Current anti-anxiolytic:  none Current sleep aide:  none Current Antihypertensive medications:  Verapamil  CR 120mg  daily Current Antidepressant medications:  none Current Anticonvulsant medications:  none Current anti-CGRP:  Aimovig  140mg  Current Vitamins/Herbal/Supplements:  none Current Antihistamines/Decongestants:  Afrin Other therapy:  none Hormone/birth control:  estradiol  Other medication:  prednisone  5-10mg  daily   Caffeine:  1 1/2 cups coffee daily Diet:  Keeps  hydrated.  Does not skip meals.  On Optivia diet.  Lost 30 lbs since January Exercise:  Walks dogs Depression:  no; Anxiety:  no Other pain:  Spinal pain related to perineural cysts.  Recent knee replacement, still with mild soreness Sleep hygiene:  good   HISTORY:  Onset:  Since the early 1990s.  She was evaluated at [facility].  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    MRI of spine from 2005 showed a syrinx from T3 to T12 levels.  She subsequently underwent occipital posterior fossa decompression surgery that year.  A CT cystogram in 2007 showed residual cyst in the posterior fossa with postoperative changes but no hydrocephalus or other intracranial mass.  Headaches subsided following decompression.  They started to return in May 2016.  She was previously followed by Dr. Onita at [facility]. Location:  Bifrontal/posterior neck pain Quality:  Pressure, throbbing Initial intensity:  10/10.  she denies new headache, thunderclap headache or severe headache that wakes her from sleep. Aura:  Beginning in 2023, she has been experiencing new symptoms.  When she sits up in a chair she has monocular visual disturbance in either eye, described as squiggly lines, sometimes a flash or white patch.  It lasts as long as she is sitting up in that position.  Right sided neck pain is aggravated in that position.  Eye exams have been unrevealing.  MRI of brain, cervical and thoracic spine  on 05/07/2022 revealed no changes compared to prior imaging.  Premonitory Phase:  no Postdrome:  no Associated symptoms:  With or without nausea, photophobia, phonophobia.  She denies associated unilateral numbness or weakness. Initial Duration:  1-2 days.  Often in the morning when she wakes up Initial Frequency:  15 days a month Initial Frequency of abortive  medication: sumatriptan  or rizatriptan  12-15 days a month Triggers:  Prolonged standing or activity/lifting Relieving factors:  Just medications. Activity:  Can't function  When she sits with any pressure on her spine, she endorses seeing flashes of white light or floaters, likely related to syringomyelia   Past NSAIDS:  Cambia , ibuprofen  Past analgesics:  Excedrin, Tylenol  Past abortive triptans:  Relpax  40mg , Zomig  5mg  NS, sumatriptan  [place]/tab (caused chest tightness) Past abortive ergotamine:  none Past muscle relaxants:  none Past anti-emetic:  Zofran  Past antihypertensive medications:  atenolol , metoprolol  Past antidepressant medications:  Nortriptyline  (did not feel well on it) Past anticonvulsant medications:  Trokendi  XR,topiramate  200mg  twice daily;  gabapentin , Lyrica Past anti-CGRP: Emgality  (palpitations), Ubrelvy  100mg  (effective but not covered by insurance), Nurtec (increased headache) Past vitamins/Herbal/Supplements:  none Past antihistamines/decongestants:  Flonase  Other past therapies:  Botox  (effective)   Family history of headache:  No   05/07/2022 MRI BRAIN WO:  Distant suboccipital craniotomy. Mega cisterna magna, unchanged in appearance compared to the examination of 05/20/2007.  Otherwise normal appearance of the brain with exception of a punctate focus of T2 and FLAIR signal in the right temporal lobe, unchanged since 2008, not significant. 05/07/2022 MRI C-SPINE WO:  Prominence of the central canal from upper C6 to the level of the C7-T1 disc, maximal transverse diameter 2 mm, the same as was described in 2009.  No compressive pathology of the canal or foramina. Mild facet osteoarthritis on the right at C3-4 and C4-5. 05/07/2022 MRI T-SPINE WO:  No progressive or worsening finding compared to the study of 2009. Thoracic syrinx extending from the level of the T3-4 disc to the T11-12 disc level. The syrinx is actually slightly smaller when compared to the prior  study, maximal transverse diameter at T4 measuring 3 mm today compared with 3.2 mm previously and with slight reduction in transverse diameter in the lower thoracic region, though the maximal measurement at the T10 level remains 3.6 mm.  PAST MEDICAL HISTORY: Past Medical History:  Diagnosis Date   Abnormal weight gain    Acute sinusitis 12/05/2008   1/18 8/18   Allergy 2005   During decompression surgery   Arachnoid cyst 08/19/2011   Arthritis 2010   Hands, Legs, Feet   Asthma 2023   Dr. Meade, Pulmonologist diagnosed   Chest tightness or pressure 05/07/2018   Constipation 09/08/2012   Chronic - opioid related 11/17 Amitiza    Contact dermatitis 03/16/2013   5/14 relapsing - poison ivy    Dyspnea 05/07/2018   Elevated liver enzymes 03/04/2011   Chronic off and on    Female stress incontinence 02/06/2009   Qualifier: Diagnosis of  By: Garald MD, Karlynn [name]    FEVER UNSPECIFIED 09/29/2007   Qualifier: Diagnosis of  By: Garald MD, Karlynn [name]    GERD 09/29/2007   Chronic  Protonix  prn  Potential benefits of a long term PPI use as well as potential risks  and complications were explained to the patient and were aknowledged.     GERD (gastroesophageal reflux disease)    Headache 12/20/2007   Dr Gladis Migraines (1 per 2 wks) Topamax  400 mg/d; Imitrex  prn; Ibuprofen  prn,  Zofran  prn    Hyperglycemia 08/19/2011   Mild     Hypertension    Increased blood pressure (not hypertension) 08/23/2014   Intractable migraine 06/14/2009   Chronic Ibuprofen , Imitrex , Topamax ,  Nortriptyline  - intolerant   Knee pain 03/04/2011   R knee 2018 L knee pain - L knee b anserina and L knee is tender w/ROM: L knee pain is worse - patellofemoral syndrome vs other   LBP (low back pain)    Local anesthetic drug adverse reaction 01/21/2022   LOW BACK PAIN 09/29/2007   Chronic, lately (2016) disabling Due to syringomyelia On Oxycodone   Potential benefits of a long term opioids use as well as potential  risks (i.e. addiction risk, apnea etc) and complications (i.e. Somnolence, constipation and others) were explained to the patient and were aknowledged.     LUQ PAIN 05/23/2010   Qualifier: Diagnosis of  By: Plotnikov MD, Karlynn [name]    Migraine, chronic, without aura, intractable 10/28/2017   Botox  approved diagnosis. 7/19 Worse. Start Emagility inj. Relpax    Migraines    Myocardial infarction (HCC) 2010   Tachycardia   Nausea & vomiting 08/19/2011   Nausea without vomiting 06/14/2009   Due to migraines     NECK PAIN 06/14/2009    Chronic, lately (2016) disabling Due to syringomyelia On Oxycodone   Potential benefits of a long term opioids use as well as potential risks (i.e. addiction risk, apnea etc) and complications (i.e. Somnolence, constipation and others) were explained to the patient and were aknowledged.     NONSPECIFIC ABNORMAL RESULTS LIVR FUNCTION STUDY 09/28/2009   Qualifier: Diagnosis of  By: Garald MD, Karlynn [name]    OBSESSIVE-COMPULSIVE DISORDER 05/23/2010   Chronic - compulsive shopping    Onychomycosis 01/30/2016   4/17    Orthostatic hypotension 06/30/2013   9/14 likely Rapaflo induced    OTITIS EXTERNA 05/10/2008   Qualifier: Diagnosis of  By: Garald MD, Karlynn [name]    Palpitations 06/21/2014   PARESTHESIA 07/13/2008   Qualifier: Diagnosis of  By: Garald MD, Karlynn [name]    Partial thickness burn of left wrist 09/11/2015   Postoperative state 07/24/2016   Pyelonephritis 2008   Rash 07/27/2017   Stress 2009   Syncope, near 06/30/2013   9/14 likely Rapaflo induced 12/14 relapsing off Rapaflo x 7 2/15 no relapsing since on 1/2 dose of Topamax     Syringobulbia (HCC)    SYRINGOMYELIA 08/20/2007   Chronic Dr Onita Dr [facility] at [facility] Chronic pain    Syringomyelia (HCC)    Tachycardia    Tonsillitis, chronic 02/26/2017   2018 worse   URINARY TRACT INFECTION (UTI) 03/23/2008   Qualifier: Diagnosis of  By: Garald MD, Karlynn [name]    UTI (lower urinary tract  infection)    Well adult exam 12/21/2013   We discussed age appropriate health related issues, including available/recomended screening tests and vaccinations. We discussed a need for adhering to healthy diet and exercise. Labs/EKG were reviewed/ordered. All questions were answered.       MEDICATIONS: Current Outpatient Medications on File Prior to Visit  Medication Sig Dispense Refill   botulinum toxin Type A  (BOTOX ) 200 units injection Inject 155 units IM into multiple site in the face,neck and head once every 90 days 1 each 4   cholecalciferol (VITAMIN D ) 1000 UNITS tablet Take 1,000 Units by mouth daily.     cyclobenzaprine  (FLEXERIL ) 5 MG tablet TAKE 1 TABLET THREE TIMES DAILY AS NEEDED FOR MUSCLE SPASMS. 90 tablet 3  Erenumab -aooe (AIMOVIG ) 140 MG/ML SOAJ Inject 140 mg into the skin every 28 (twenty-eight) days. 1.12 mL 5   estradiol  (ESTRACE ) 0.5 MG tablet Take 1 tablet (0.5 mg total) by mouth daily. 90 tablet 1   fluticasone  furoate-vilanterol (BREO ELLIPTA ) 100-25 MCG/ACT AEPB Inhale 1 puff into the lungs daily. 1 each 5   linaclotide  (LINZESS ) 290 MCG CAPS capsule Take 1 capsule (290 mcg total) by mouth daily before breakfast. TAKE 30 MINUTES BEFORE A MEAL 30 capsule 5   Multiple Vitamin (MULTIVITAMIN PO) Take 1 tablet by mouth daily.     naproxen  (NAPROSYN ) 500 MG tablet TAKE ONE TABLET BY MOUTH TWICE DAILY WITH A MEAL 60 tablet 3   nitroGLYCERIN  (NITROSTAT ) 0.4 MG SL tablet Place 1 tablet (0.4 mg total) under the tongue every 5 (five) minutes as needed for chest pain. 25 tablet 3   ondansetron  (ZOFRAN ) 8 MG tablet TAKE ONE TABLET BY MOUTH EVERY 8 HOURS AS NEEDED FOR NAUSEA OR FOR VOMITING 21 tablet 3   Oxycodone  HCl 10 MG TABS Take 1 tablet (10 mg total) by mouth every 6 (six) hours as needed. 120 tablet 0   Oxycodone  HCl 10 MG TABS Take 1 tablet (10 mg total) by mouth every 6 (six) hours as needed. 120 tablet 0   Oxycodone  HCl 10 MG TABS Take 1 tablet (10 mg total) by mouth every 6  (six) hours as needed. 120 tablet 0   oxymetazoline  (AFRIN NASAL SPRAY) 0.05 % nasal spray Place 1 spray into both nostrils 2 (two) times daily. 30 mL 0   pantoprazole  (PROTONIX ) 40 MG tablet Take 1 tablet (40 mg total) by mouth 2 (two) times daily before a meal. 60 tablet 2   polyethylene glycol (MIRALAX  / GLYCOLAX ) packet Take 8.5 g by mouth every other day.     predniSONE  (DELTASONE ) 5 MG tablet Take 1-2 tablets (5-10 mg total) by mouth daily with breakfast. 100 tablet 1   rizatriptan  (MAXALT ) 10 MG tablet TAKE 1 TABLET AT ONSET OF MIGRAINE- MAY REPEAT ONCE IN 2 HOURS. LIMIT 2/24 HOURS. AVOID DAILY USE. 8 tablet 5   senna (SENOKOT) 8.6 MG tablet Take 1 tablet by mouth as needed for constipation.     verapamil  (CALAN -SR) 120 MG CR tablet TAKE ONE TABLET BY MOUTH AT BEDTIME 90 tablet 1   No current facility-administered medications on file prior to visit.    ALLERGIES: Allergies  Allergen Reactions   Atenolol  Other (See Comments)    Wt gain   Cefuroxime Axetil Nausea Only and Other (See Comments)    REACTION: nausea - but tolerates PCN   Codeine Sulfate Nausea And Vomiting and Nausea Only   Fentanyl  Nausea And Vomiting and Other (See Comments)    REACTION: bad reaction   Imdur  [Isosorbide  Nitrate] Other (See Comments)    Headaches   Iodinated Contrast Media Rash   Nitrofurantoin Other (See Comments)    Unknown   Pregabalin Other (See Comments)    REACTION: cp   Rapaflo [Silodosin] Other (See Comments)    Orthostatic BP drop, near syncope    Topamax  [Topiramate ] Other (See Comments)    Tight chest    Tramadol Other (See Comments)   Tramadol Hcl Nausea And Vomiting    REACTION: sick    FAMILY HISTORY: Family History  Problem Relation Age of Onset   Colon polyps Mother    COPD Mother    Peripheral vascular disease Mother    Hypertension Mother    Heart disease Mother  Arthritis Mother    Vision loss Mother    Hypertension Father    Arthritis Father    Stroke Father     Vision loss Father    Breast cancer Maternal Aunt    Colon cancer Maternal Uncle 60   Heart attack Maternal Grandmother    Diabetes Maternal Grandmother    Stroke Maternal Grandfather    Stroke Paternal Grandfather    Asthma Granddaughter    Anxiety disorder Other    ADD / ADHD Daughter    Asthma Daughter    Esophageal cancer Neg Hx    Pancreatic cancer Neg Hx    Stomach cancer Neg Hx       Objective:  Blood pressure 137/85, pulse 74, height 5' 5 (1.651 m), weight 152 lb (68.9 kg), SpO2 98%. General: No acute distress.  Patient appears well-groomed.   Head:  Normocephalic/atraumatic Eyes:  Fundi examined but not visualized Neck: supple, no paraspinal tenderness, full range of motion Heart:  Regular rate and rhythm Neurological Exam: alert and oriented.  Speech fluent and not dysarthric, language intact.  CN II-XII intact. Bulk and tone normal, muscle strength 5/5 throughout.  Sensation to light touch and vibration intact.  Deep tendon reflexes 2+ throughout, toes downgoing.  Finger to nose testing intact.  Gait normal, Romberg negative.   Juliene Dunnings, DO  CC: Karlynn Noel, MD

## 2024-08-31 ENCOUNTER — Encounter: Payer: Self-pay | Admitting: Neurology

## 2024-08-31 ENCOUNTER — Ambulatory Visit: Payer: Self-pay | Admitting: Neurology

## 2024-08-31 VITALS — BP 137/85 | HR 74 | Ht 65.0 in | Wt 152.0 lb

## 2024-08-31 DIAGNOSIS — D171 Benign lipomatous neoplasm of skin and subcutaneous tissue of trunk: Secondary | ICD-10-CM | POA: Diagnosis not present

## 2024-08-31 DIAGNOSIS — D1801 Hemangioma of skin and subcutaneous tissue: Secondary | ICD-10-CM | POA: Diagnosis not present

## 2024-08-31 DIAGNOSIS — L720 Epidermal cyst: Secondary | ICD-10-CM | POA: Diagnosis not present

## 2024-08-31 DIAGNOSIS — L821 Other seborrheic keratosis: Secondary | ICD-10-CM | POA: Diagnosis not present

## 2024-08-31 DIAGNOSIS — G43009 Migraine without aura, not intractable, without status migrainosus: Secondary | ICD-10-CM | POA: Diagnosis not present

## 2024-09-15 ENCOUNTER — Ambulatory Visit: Admitting: Neurology

## 2024-09-17 NOTE — Progress Notes (Unsigned)
 Cardiology Office Note:   Date:  09/21/2024  ID:  Beth Huffman, DOB 1961-03-21, MRN [id number] PCP:  Garald Karlynn [name], MD  [facility] Providers Cardiologist:  Wendel Haws, MD Referring MD: Shermon Warren SAILOR, PA-C  Chief Complaint/Reason for Referral: Chest pain ASSESSMENT:    1. Palpitations   2. Hyperlipidemia LDL goal <70   3. Aortic atherosclerosis     PLAN:   In order of problems listed above: Palpitations: TSH within normal limits for the last 6 months.  Will obtain echocardiogram monitor to evaluate further. Hyperlipidemia: By virtue of the patient's presence of aortic atherosclerosis, goal LDL is less than 70 potentially lower.  LDL when checked in July was 125.  Start atorvastatin  20 mg.  Check lipid panel, LFTs, LP(a) in 2 months. Aortic atherosclerosis: Continue aspirin  81 mg, start atorvastatin  20 mg             Dispo:  Return in about 6 months (around 03/21/2025).       I spent 41 minutes reviewing all clinical data during and prior to this visit including all relevant imaging studies, laboratories, clinical information from other health systems and prior notes from both Cardiology and other specialties, interviewing the patient, conducting a complete physical examination, and coordinating care in order to formulate a comprehensive and personalized evaluation and treatment plan.   History of Present Illness:    FOCUSED PROBLEM LIST:   Hypertension Hyperlipidemia Aortic atherosclerosis Chest CT 2023 GERD Migraines Asthma BMI 20 September 2024:  Patient consents to use of AI scribe. The patient is a 63 year old female with above listed medical problems referred from the emergency room.  BNP and troponin were negative.  Her EKG demonstrated normal sinus rhythm with no ischemic changes.  She experiences heart palpitations, nausea, and perspiration after consuming two cups of espresso, which she had never had before. These symptoms included a  'rushing of the heart' and occurred on the same day as the espresso consumption. She has since avoided espresso, which has helped reduce the symptoms, though not completely.  She describes positional symptoms where bending down or sleeping on her left side causes her heart to speed up and flutter, accompanied by chest constriction. These symptoms occur during activities such as gardening and when she sleeps on her left side, leading her to sleep on her right side instead.  She has a history of brain surgery for an arachnoid cyst and mentions experiencing other symptoms related to her bowels and bladder, which she plans to address at [facility]. She also reports having cysts in her spinal cord and breast, which she does not find alarming.  She has been experiencing breathing issues and was diagnosed with heat-induced asthma, for which she was prescribed an inhaler. She underwent breathing tests, though the details of these tests are not specified.  She experiences episodes of tachycardia, which she describes as her heart racing for about 30 seconds before returning to normal. These episodes occur approximately once a week, often at night. She was previously monitored at the beginning of the year.     Current Medications: Current Meds  Medication Sig   aspirin  EC 81 MG tablet Take 81 mg by mouth daily. Swallow whole.   atorvastatin  (LIPITOR) 20 MG tablet Take 1 tablet (20 mg total) by mouth daily.   azelastine (ASTELIN) 0.1 % nasal spray 1-2 sprays each nostril twice a day; Duration: 30 days   botulinum toxin Type A  (BOTOX ) 200 units injection Inject 155 units IM into  multiple site in the face,neck and head once every 90 days   cholecalciferol (VITAMIN D ) 1000 UNITS tablet Take 1,000 Units by mouth daily.   cyclobenzaprine  (FLEXERIL ) 5 MG tablet TAKE 1 TABLET THREE TIMES DAILY AS NEEDED FOR MUSCLE SPASMS. (Patient taking differently: Take 5 mg by mouth as needed. TAKE 1 TABLET THREE TIMES DAILY AS NEEDED  FOR MUSCLE SPASMS.)   Erenumab -aooe (AIMOVIG ) 140 MG/ML SOAJ Inject 140 mg into the skin every 28 (twenty-eight) days.   estradiol  (ESTRACE ) 0.5 MG tablet Take 1 tablet (0.5 mg total) by mouth daily.   fluticasone  furoate-vilanterol (BREO ELLIPTA ) 100-25 MCG/ACT AEPB Inhale 1 puff into the lungs daily. (Patient taking differently: Inhale 1 puff into the lungs as needed.)   linaclotide  (LINZESS ) 145 MCG CAPS capsule TAKE 1 CAPSULE BY MOUTH DAILY BEFORE BREAKFAST   LINZESS  72 MCG capsule Take 72 mcg by mouth every morning.   Multiple Vitamin (MULTIVITAMIN PO) Take 1 tablet by mouth daily.   naproxen  (NAPROSYN ) 500 MG tablet TAKE ONE TABLET BY MOUTH TWICE DAILY WITH A MEAL   nitroGLYCERIN  (NITROSTAT ) 0.4 MG SL tablet Place 1 tablet (0.4 mg total) under the tongue every 5 (five) minutes as needed for chest pain.   NURTEC 75 MG TBDP Take by mouth as needed.   ondansetron  (ZOFRAN ) 8 MG tablet TAKE ONE TABLET BY MOUTH EVERY 8 HOURS AS NEEDED FOR NAUSEA OR FOR VOMITING   Oxycodone  HCl 10 MG TABS Take 1 tablet (10 mg total) by mouth every 6 (six) hours as needed.   Oxycodone  HCl 10 MG TABS Take 1 tablet (10 mg total) by mouth every 6 (six) hours as needed.   Oxycodone  HCl 10 MG TABS Take 1 tablet (10 mg total) by mouth every 6 (six) hours as needed.   oxymetazoline  (AFRIN NASAL SPRAY) 0.05 % nasal spray Place 1 spray into both nostrils 2 (two) times daily.   pantoprazole  (PROTONIX ) 40 MG tablet Take 1 tablet (40 mg total) by mouth 2 (two) times daily before a meal.   polyethylene glycol (MIRALAX  / GLYCOLAX ) packet Take 8.5 g by mouth every other day.   predniSONE  (DELTASONE ) 5 MG tablet Take 1-2 tablets (5-10 mg total) by mouth daily with breakfast.   rizatriptan  (MAXALT ) 10 MG tablet TAKE 1 TABLET AT ONSET OF MIGRAINE- MAY REPEAT ONCE IN 2 HOURS. LIMIT 2/24 HOURS. AVOID DAILY USE.   senna (SENOKOT) 8.6 MG tablet Take 1 tablet by mouth as needed for constipation.   verapamil  (CALAN -SR) 120 MG CR tablet  TAKE ONE TABLET BY MOUTH AT BEDTIME     Review of Systems:   Please see the history of present illness.    All other systems reviewed and are negative.     EKGs/Labs/Other Test Reviewed:   EKG: 2025 normal sinus rhythm  EKG Interpretation Date/Time:  Wednesday September 21 2024 13:52:50 EST Ventricular Rate:  77 PR Interval:  134 QRS Duration:  100 QT Interval:  388 QTC Calculation: 439 R Axis:   66  Text Interpretation: Normal sinus rhythm When compared with ECG of 12-Aug-2024 08:15, No significant change was found Confirmed by Wendel Haws ([id number]) on 09/21/2024 1:59:17 PM        CARDIAC STUDIES: Refer to CV Procedures and Imaging Tabs   Risk Assessment/Calculations:          Physical Exam:   VS:  BP 130/84 (BP Location: Right Arm, Patient Position: Sitting, Cuff Size: Normal)   Pulse 77   Ht 5' 5 (1.651 m)  Wt 152 lb (68.9 kg)   SpO2 97%   BMI 25.29 kg/m        Wt Readings from Last 3 Encounters:  09/21/24 152 lb (68.9 kg)  08/31/24 152 lb (68.9 kg)  08/11/24 159 lb 6.4 oz (72.3 kg)      GENERAL:  No apparent distress, AOx3 HEENT:  No carotid bruits, +2 carotid impulses, no scleral icterus CAR: RRR no murmurs, gallops, rubs, or thrills RES:  Clear to auscultation bilaterally ABD:  Soft, nontender, nondistended, positive bowel sounds x 4 VASC:  +2 radial pulses, +2 carotid pulses NEURO:  CN 2-12 grossly intact; motor and sensory grossly intact PSYCH:  No active depression or anxiety EXT:  No edema, ecchymosis, or cyanosis  Signed, Brittanni Cariker K Marcellous Snarski, MD  09/21/2024 2:14 PM    [facility] [address] [place], [place], [place]  [address] Phone: [phone]; Fax: [phone]   Note:  This document was prepared using Dragon voice recognition software and may include unintentional dictation errors.

## 2024-09-19 ENCOUNTER — Other Ambulatory Visit: Payer: Self-pay | Admitting: Internal Medicine

## 2024-09-19 NOTE — Progress Notes (Deleted)
 Cardiology Office Note:  .   Date:  09/19/2024  ID:  Beth Huffman, DOB 01/31/1961, MRN [id number] PCP: Beth Karlynn [name], MD  [facility] Providers Cardiologist:  None Electrophysiologist:  Beth Sage, MD (Inactive) {  History of Present Illness: .   Beth Huffman is a 63 y.o. female w/PMHx of  HTN, migraine HAs ATach  Last saw Dr. Sage 12/21/23, reported DOE at that visit with some degree of CP, as well as some palpitations Questioned INOCA and Rx s/l NTG  She saw Jodie 02/15/24, mentioned that her fear of headache has kept her from using s/l NTG, was pending GI eval for constipation with EGD/colonoscopy Lightheadedness episodes did not correlate on monitor.  Discussed possibility of loop recorder to help correlate her symptoms  Referred to/saw cards APP 06/21/24, ongoing and unchanged symptoms including CP, SOB. Nicely laid out prior w/u ECHO 2019 showed 60-65% EF, mobile density c/w redundant chordae tendinae, trivial TVR LHC in 2019 showed normal coronary arteries. Noted that chest discomfort and dyspnea does not appear related to CAD or LV dysfunction.  CCTA in 2023 showed normal coronary arteries CAC score of 0 Myoview  in 03/2022 WNL CP reported as occurring only when bending over, described as tightness, 8 out of 10, lasting less than 1 minute, located midsternal with occasional radiation to the neck. 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  No further ischemic w/u recommended, planned to avoid nitrates/ranexa 2/2 headaches. Advised wearable tech/kardia etc.. to better understand HRs, symptoms Rec f/u with EP in 52mo   Today's visit is scheduled as a 3 mo EP visit ROS:   *** symptoms, tech? *** PMD for noncardiac CP *** update echo?   Studies Reviewed: .    Feb 2025, monitor Findings HR  avg 80  Min 57-Max 132  PVCs Rare, less than  1%  PACs Rare, less than 1%  SVT Nonsustained 6 episodes; fastest 164 bpm for 9 beats; longest for 21  secs at 124 bpm    Symptoms:            Chest pressure/ irregular heart beats>> sinus Triggered: sinus             Sinus with PAC             Sinus            Atrial non sustained tach               Conclusions: No significant arrhythmias noted with the symptoms And conversely no significant symptoms reported with the run of SVT  Risk Assessment/Calculations:    Physical Exam:   VS:  There were no vitals taken for this visit.   Wt Readings from Last 3 Encounters:  08/31/24 152 lb (68.9 kg)  08/11/24 159 lb 6.4 oz (72.3 kg)  07/20/24 152 lb (68.9 kg)    GEN: Well nourished, well developed in no acute distress NECK: No JVD; No carotid bruits CARDIAC: ***RRR, no murmurs, rubs, gallops RESPIRATORY:  *** CTA b/l without rales, wheezing or rhonchi  ABDOMEN: Soft, non-tender, non-distended EXTREMITIES: *** No edema; No deformity   ASSESSMENT AND PLAN: .    ATach ***  CP, SOB ***     {Are you ordering a CV Procedure (e.g. stress test, cath, DCCV, TEE, etc)?   Press F2        :[id number]}  Dispo: ***  Signed, Beth Macario Arthur, PA-C

## 2024-09-21 ENCOUNTER — Ambulatory Visit: Payer: Self-pay | Attending: Internal Medicine | Admitting: Internal Medicine

## 2024-09-21 ENCOUNTER — Ambulatory Visit: Admitting: Physician Assistant

## 2024-09-21 ENCOUNTER — Encounter: Payer: Self-pay | Admitting: Internal Medicine

## 2024-09-21 VITALS — BP 130/84 | HR 77 | Ht 65.0 in | Wt 152.0 lb

## 2024-09-21 DIAGNOSIS — I700 Atherosclerosis of aorta: Secondary | ICD-10-CM | POA: Diagnosis not present

## 2024-09-21 DIAGNOSIS — R002 Palpitations: Secondary | ICD-10-CM | POA: Diagnosis not present

## 2024-09-21 DIAGNOSIS — E785 Hyperlipidemia, unspecified: Secondary | ICD-10-CM

## 2024-09-21 DIAGNOSIS — R072 Precordial pain: Secondary | ICD-10-CM

## 2024-09-21 MED ORDER — ATORVASTATIN CALCIUM 20 MG PO TABS: 20.0000 mg | ORAL_TABLET | Freq: Every day | ORAL | 3 refills | Status: DC

## 2024-09-21 NOTE — Patient Instructions (Signed)
 Medication Instructions:  START Atorvastatin  20 mg once daily   *If you need a refill on your cardiac medications before your next appointment, please call your pharmacy*  Lab Work: To be completed in 2 months: lipid panel, LFT, lipoprotein-a  If you have labs (blood work) drawn today and your tests are completely normal, you will receive your results only by: MyChart Message (if you have MyChart) OR A paper copy in the mail If you have any lab test that is abnormal or we need to change your treatment, we will call you to review the results.  Testing/Procedures: Your physician has requested that you have an echocardiogram. Echocardiography is a painless test that uses sound waves to create images of your heart. It provides your doctor with information about the size and shape of your heart and how well your heart's chambers and valves are working. This procedure takes approximately one hour. There are no restrictions for this procedure. Please do NOT wear cologne, perfume, aftershave, or lotions (deodorant is allowed). Please arrive 15 minutes prior to your appointment time.  Please note: We ask at that you not bring children with you during ultrasound (echo/ vascular) testing. Due to room size and safety concerns, children are not allowed in the ultrasound rooms during exams. Our front office staff cannot provide observation of children in our lobby area while testing is being conducted. An adult accompanying a patient to their appointment will only be allowed in the ultrasound room at the discretion of the ultrasound technician under special circumstances. We apologize for any inconvenience.   Your physician has requested that you wear a Zio heart monitor for 7 days. This will be mailed to your home with instructions on how to apply the monitor and how to return it when finished. Please allow 2 weeks after returning the heart monitor before our office calls you with the results.    Follow-Up: At [facility], you and your health needs are our priority.  As part of our continuing mission to provide you with exceptional heart care, our providers are all part of one team.  This team includes your primary Cardiologist (physician) and Advanced Practice Providers or APPs (Physician Assistants and Nurse Practitioners) who all work together to provide you with the care you need, when you need it.  Your next appointment:   6 month(s)  Provider:   One of our Advanced Practice Providers (APPs): Morse Clause, PA-C  Lamarr Satterfield, NP Miriam Shams, NP  Olivia Pavy, PA-C Josefa Beauvais, NP  Leontine Salen, PA-C Orren Fabry, PA-C  Hao Meng, PA-C Ernest Dick, NP  Damien Braver, NP Jon Hails, PA-C  Waddell Donath, PA-C    Dayna Dunn, PA-C  Scott Weaver, PA-C Lum Louis, NP Katlyn West, NP Callie Goodrich, PA-C  Xika Zhao, NP Sheng Haley, PA-C    Kathleen Johnson, PA-C   We recommend signing up for the patient portal called MyChart.  Sign up information is provided on this After Visit Summary.  MyChart is used to connect with patients for Virtual Visits (Telemedicine).  Patients are able to view lab/test results, encounter notes, upcoming appointments, etc.  Non-urgent messages can be sent to your provider as well.   To learn more about what you can do with MyChart, go to [email or web address].   Other Instructions ZIO XT- Long Term Monitor Instructions  Your physician has requested you wear a ZIO patch monitor for 7 days.   This is a single patch monitor. [employer] supplies one patch monitor  per enrollment. Additional  stickers are not available. Please do not apply patch if you will be having a Nuclear Stress Test,  Echocardiogram, Cardiac CT, MRI, or Chest Xray during the period you would be wearing the  monitor. The patch cannot be worn during these tests. You cannot remove and re-apply the  ZIO XT patch monitor.   Your ZIO patch monitor will be  mailed 3 day USPS to your address on file. It may take 3-5 days  to receive your monitor after you have been enrolled.   Once you have received your monitor, please review the enclosed instructions. Your monitor  has already been registered assigning a specific monitor serial # to you.     Billing and Patient Assistance Program Information  We have supplied [employer] with any of your insurance information on file for billing purposes.  [employer] offers a sliding scale Patient Assistance Program for patients that do not have  insurance, or whose insurance does not completely cover the cost of the ZIO monitor.  You must apply for the Patient Assistance Program to qualify for this discounted rate.   To apply, please call [employer] at [phone], select option 4, select option 2, ask to apply for  Patient Assistance Program. Meredeth will ask your household income, and how many people  are in your household. They will quote your out-of-pocket cost based on that information.  [employer] will also be able to set up a 30-month, interest-free payment plan if needed.     Applying the monitor  Shave hair from upper left chest.  Hold abrader disc by orange tab. Rub abrader in 40 strokes over the upper left chest as  indicated in your monitor instructions.  Clean area with 4 enclosed alcohol pads. Let dry.  Apply patch as indicated in monitor instructions. Patch will be placed under collarbone on left  side of chest with arrow pointing upward.  Rub patch adhesive wings for 2 minutes. Remove white label marked 1. Remove the white  label marked 2. Rub patch adhesive wings for 2 additional minutes.  While looking in a mirror, press and release button in center of patch. A small green light will  flash 3-4 times. This will be your only indicator that the monitor has been turned on.  Do not shower for the first 24 hours. You may shower after the first 24 hours.  Press the button if you feel a symptom. You  will hear a small click. Record Date, Time and  Symptom in the Patient Logbook.  When you are ready to remove the patch, follow instructions on the last 2 pages of Patient  Logbook. Stick patch monitor onto the last page of Patient Logbook.   Place Patient Logbook in the blue and white box. Use locking tab on box and tape box closed  securely. The blue and white box has prepaid postage on it. Please place it in the mailbox as  soon as possible. Your physician should have your test results approximately 7 days after the  monitor has been mailed back to [facility].   Call [facility] Customer Care at [phone] if you have questions regarding  your ZIO XT patch monitor. Call them immediately if you see an orange light blinking on your  monitor.   If your monitor falls off in less than 4 days, contact our Monitor department at [phone].   If your monitor becomes loose or falls off after 4 days call [employer] at [phone] for  suggestions on  securing your monitor.

## 2024-09-26 ENCOUNTER — Other Ambulatory Visit: Payer: Self-pay | Admitting: Internal Medicine

## 2024-09-26 ENCOUNTER — Encounter: Payer: Self-pay | Admitting: Internal Medicine

## 2024-10-01 LAB — GENECONNECT MOLECULAR SCREEN: Genetic Analysis Overall Interpretation: NEGATIVE

## 2024-10-05 ENCOUNTER — Ambulatory Visit: Admitting: Internal Medicine

## 2024-10-05 ENCOUNTER — Encounter: Payer: Self-pay | Admitting: Internal Medicine

## 2024-10-05 VITALS — BP 148/86 | HR 70 | Temp 98.2°F | Ht 65.0 in | Wt 152.0 lb

## 2024-10-05 DIAGNOSIS — G8929 Other chronic pain: Secondary | ICD-10-CM

## 2024-10-05 DIAGNOSIS — G950 Syringomyelia and syringobulbia: Secondary | ICD-10-CM

## 2024-10-05 DIAGNOSIS — M150 Primary generalized (osteo)arthritis: Secondary | ICD-10-CM | POA: Diagnosis not present

## 2024-10-05 DIAGNOSIS — M5442 Lumbago with sciatica, left side: Secondary | ICD-10-CM | POA: Diagnosis not present

## 2024-10-05 DIAGNOSIS — M5441 Lumbago with sciatica, right side: Secondary | ICD-10-CM | POA: Diagnosis not present

## 2024-10-05 MED ORDER — OXYCODONE HCL 10 MG PO TABS: 10.0000 mg | ORAL_TABLET | Freq: Four times a day (QID) | ORAL | 0 refills | Status: AC | PRN

## 2024-10-05 NOTE — Assessment & Plan Note (Signed)
 Will consult Dr Patrcia for hand OA treatment, knee OA - ?DRI for geniculate artery embolization option

## 2024-10-05 NOTE — Assessment & Plan Note (Signed)
Due to syringomyelia On Oxycodone  Potential benefits of a long term opioids use as well as potential risks (i.e. addiction risk, apnea etc) and complications (i.e. Somnolence, constipation and others) were explained to the patient and were aknowledged. 

## 2024-10-05 NOTE — Progress Notes (Unsigned)
 Subjective:  Patient ID: Beth Huffman, female    DOB: [birth year]  Age: 63 y.o. MRN: [id number]  CC: Medical Management of Chronic Issues (3 Month follow up)   HPI Beth Huffman presents for knee pain, hand OA - bad F/u on chronic pain Prednisone  helped w/OA  - stopped due to costipation  Outpatient Medications Prior to Visit  Medication Sig Dispense Refill   aspirin  EC 81 MG tablet Take 81 mg by mouth daily. Swallow whole.     botulinum toxin Type A  (BOTOX ) 200 units injection Inject 155 units IM into multiple site in the face,neck and head once every 90 days 1 each 4   cholecalciferol (VITAMIN D ) 1000 UNITS tablet Take 1,000 Units by mouth daily.     cyclobenzaprine  (FLEXERIL ) 5 MG tablet TAKE 1 TABLET THREE TIMES DAILY AS NEEDED FOR MUSCLE SPASMS. (Patient taking differently: Take 5 mg by mouth as needed. TAKE 1 TABLET THREE TIMES DAILY AS NEEDED FOR MUSCLE SPASMS.) 90 tablet 3   Erenumab -aooe (AIMOVIG ) 140 MG/ML SOAJ Inject 140 mg into the skin every 28 (twenty-eight) days. 1.12 mL 5   estradiol  (ESTRACE ) 0.5 MG tablet Take 1 tablet (0.5 mg total) by mouth daily. 90 tablet 1   fluticasone  furoate-vilanterol (BREO ELLIPTA ) 100-25 MCG/ACT AEPB Inhale 1 puff into the lungs daily. (Patient taking differently: Inhale 1 puff into the lungs as needed.) 1 each 5   linaclotide  (LINZESS ) 145 MCG CAPS capsule TAKE 1 CAPSULE BY MOUTH DAILY BEFORE BREAKFAST 30 capsule 5   LINZESS  72 MCG capsule Take 72 mcg by mouth every morning.     Multiple Vitamin (MULTIVITAMIN PO) Take 1 tablet by mouth daily.     naproxen  (NAPROSYN ) 500 MG tablet TAKE ONE TABLET BY MOUTH TWICE DAILY WITH A MEAL 60 tablet 3   nitroGLYCERIN  (NITROSTAT ) 0.4 MG SL tablet Place 1 tablet (0.4 mg total) under the tongue every 5 (five) minutes as needed for chest pain. 25 tablet 3   NURTEC 75 MG TBDP Take by mouth as needed.     ondansetron  (ZOFRAN ) 8 MG tablet TAKE ONE TABLET BY MOUTH EVERY 8 HOURS AS NEEDED FOR NAUSEA OR FOR  VOMITING 21 tablet 3   oxymetazoline  (AFRIN NASAL SPRAY) 0.05 % nasal spray Place 1 spray into both nostrils 2 (two) times daily. 30 mL 0   polyethylene glycol (MIRALAX  / GLYCOLAX ) packet Take 8.5 g by mouth every other day.     rizatriptan  (MAXALT ) 10 MG tablet TAKE 1 TABLET AT ONSET OF MIGRAINE- MAY REPEAT ONCE IN 2 HOURS. LIMIT 2/24 HOURS. AVOID DAILY USE. 8 tablet 5   senna (SENOKOT) 8.6 MG tablet Take 1 tablet by mouth as needed for constipation.     verapamil  (CALAN -SR) 120 MG CR tablet TAKE ONE TABLET BY MOUTH AT BEDTIME 90 tablet 1   Oxycodone  HCl 10 MG TABS Take 1 tablet (10 mg total) by mouth every 6 (six) hours as needed. 120 tablet 0   Oxycodone  HCl 10 MG TABS Take 1 tablet (10 mg total) by mouth every 6 (six) hours as needed. 120 tablet 0   Oxycodone  HCl 10 MG TABS Take 1 tablet (10 mg total) by mouth every 6 (six) hours as needed. 120 tablet 0   pantoprazole  (PROTONIX ) 40 MG tablet Take 1 tablet (40 mg total) by mouth 2 (two) times daily before a meal. 60 tablet 2   azelastine (ASTELIN) 0.1 % nasal spray 1-2 sprays each nostril twice a day; Duration: 30 days  predniSONE  (DELTASONE ) 5 MG tablet Take 1-2 tablets (5-10 mg total) by mouth daily with breakfast. 100 tablet 1   atorvastatin  (LIPITOR) 20 MG tablet Take 1 tablet (20 mg total) by mouth daily. 30 tablet 3   No facility-administered medications prior to visit.    ROS: Review of Systems  Constitutional:  Positive for fatigue. Negative for activity change, appetite change, chills and unexpected weight change.  HENT:  Negative for congestion, mouth sores and sinus pressure.   Eyes:  Negative for visual disturbance.  Respiratory:  Negative for cough and chest tightness.   Gastrointestinal:  Positive for constipation. Negative for abdominal pain and nausea.  Genitourinary:  Negative for difficulty urinating, frequency and vaginal pain.  Musculoskeletal:  Positive for arthralgias, back pain, gait problem, neck pain and neck  stiffness.  Skin:  Negative for pallor and rash.  Neurological:  Negative for dizziness, tremors, weakness, numbness and headaches.  Hematological:  Does not bruise/bleed easily.  Psychiatric/Behavioral:  Negative for confusion, dysphoric mood and sleep disturbance. The patient is nervous/anxious.     Objective:  BP (!) 148/86   Pulse 70   Temp 98.2 F (36.8 C)   Ht 5' 5 (1.651 m)   Wt 152 lb (68.9 kg)   SpO2 97%   BMI 25.29 kg/m   BP Readings from Last 3 Encounters:  10/05/24 (!) 148/86  09/21/24 130/84  08/31/24 137/85    Wt Readings from Last 3 Encounters:  10/05/24 152 lb (68.9 kg)  09/21/24 152 lb (68.9 kg)  08/31/24 152 lb (68.9 kg)    Physical Exam Constitutional:      General: She is not in acute distress.    Appearance: She is well-developed.  HENT:     Head: Normocephalic.     Right Ear: External ear normal.     Left Ear: External ear normal.     Nose: Nose normal.  Eyes:     General:        Right eye: No discharge.        Left eye: No discharge.     Conjunctiva/sclera: Conjunctivae normal.     Pupils: Pupils are equal, round, and reactive to light.  Neck:     Thyroid : No thyromegaly.     Vascular: No JVD.     Trachea: No tracheal deviation.  Cardiovascular:     Rate and Rhythm: Normal rate and regular rhythm.     Heart sounds: Normal heart sounds.  Pulmonary:     Effort: No respiratory distress.     Breath sounds: No stridor. No wheezing.  Abdominal:     General: Bowel sounds are normal. There is no distension.     Palpations: Abdomen is soft. There is no mass.     Tenderness: There is no abdominal tenderness. There is no guarding or rebound.  Musculoskeletal:        General: No tenderness.     Cervical back: Normal range of motion and neck supple. No rigidity.     Right lower leg: No edema.     Left lower leg: No edema.  Lymphadenopathy:     Cervical: No cervical adenopathy.  Skin:    Findings: No erythema or rash.  Neurological:      Cranial Nerves: No cranial nerve deficit.     Motor: No abnormal muscle tone.     Coordination: Coordination normal.     Deep Tendon Reflexes: Reflexes normal.  Psychiatric:        Behavior: Behavior normal.  Thought Content: Thought content normal.        Judgment: Judgment normal.     Lab Results  Component Value Date   WBC 4.9 08/12/2024   HGB 12.6 08/12/2024   HCT 38.9 08/12/2024   PLT 296 08/12/2024   GLUCOSE 94 08/12/2024   CHOL 227 (H) 05/12/2024   TRIG 155.0 (H) 05/12/2024   HDL 70.60 05/12/2024   LDLDIRECT 130.2 12/21/2013   LDLCALC 125 (H) 05/12/2024   ALT 31 05/12/2024   AST 32 05/12/2024   NA 135 08/12/2024   K 4.0 08/12/2024   CL 100 08/12/2024   CREATININE 0.87 08/12/2024   BUN 13 08/12/2024   CO2 23 08/12/2024   TSH 1.59 05/12/2024   HGBA1C 5.5 03/31/2018    DG Chest 2 View Result Date: 08/12/2024 EXAM: 2 VIEW(S) XRAY OF THE CHEST 08/12/2024 08:41:00 AM COMPARISON: None available. CLINICAL HISTORY: Chest pain. Per triage notes: Pt. Stated, 2 days ago I was driving and I started having chest tightness, nausea, and sweats. It was with me for the rest of day. I saw Dr yesterday and she suggested I come here for a Echo cardiogram. I had some chest tightness and felt some heart fluttering yesterday. Right now I'm having some chest heaviness. I'm always SOB. FINDINGS: LUNGS AND PLEURA: No focal pulmonary opacity. No pulmonary edema. No pleural effusion. No pneumothorax. HEART AND MEDIASTINUM: No acute abnormality of the cardiac and mediastinal silhouettes. BONES AND SOFT TISSUES: No acute osseous abnormality. IMPRESSION: 1. No acute process. Electronically signed by: Lynwood Seip MD 08/12/2024 08:52 AM EDT RP Workstation: [id number]    Assessment & Plan:   Problem List Items Addressed This Visit     LOW BACK PAIN    Due to syringomyelia On Oxycodone   Potential benefits of a long term opioids use as well as potential risks (i.e. addiction risk, apnea  etc) and complications (i.e. Somnolence, constipation and others) were explained to the patient and were aknowledged.      Relevant Medications   Oxycodone  HCl 10 MG TABS   Oxycodone  HCl 10 MG TABS   Oxycodone  HCl 10 MG TABS   Osteoarthritis - Primary   Will consult Dr Patrcia for hand OA treatment, knee OA - ?DRI for geniculate artery embolization option       Relevant Medications   Oxycodone  HCl 10 MG TABS   Oxycodone  HCl 10 MG TABS   Oxycodone  HCl 10 MG TABS   Other Relevant Orders   Ambulatory referral to Radiation Oncology   SYRINGOMYELIA   Chronic pelvic, groin, vaginal, rectal, upper legs, lower abdomen numbness x several years.  She did see a urologist and has an appointment to see a gastroenterologist.  She has not discussed it with Dr. Skeet yet.  The symptoms are likely related to syringomyelia with central cord syndrome, conus medullaris syndrome Primarily pelvis anesthesia - chronic: discussed w./Dr Skeet at the next OV that is coming up soon.         Meds ordered this encounter  Medications   Oxycodone  HCl 10 MG TABS    Sig: Take 1 tablet (10 mg total) by mouth every 6 (six) hours as needed.    Dispense:  120 tablet    Refill:  0    Please fill on or after 11/01/2024. Code: M54.50   Oxycodone  HCl 10 MG TABS    Sig: Take 1 tablet (10 mg total) by mouth every 6 (six) hours as needed.    Dispense:  120 tablet  Refill:  0    Please fill on or after 12/01/2024. Code: M54.50   Oxycodone  HCl 10 MG TABS    Sig: Take 1 tablet (10 mg total) by mouth every 6 (six) hours as needed.    Dispense:  120 tablet    Refill:  0    Please fill on or after 12/31/2024. Code: M54.50      Follow-up: Return in about 3 months (around 01/03/2025) for a follow-up visit.  Marolyn Noel, MD

## 2024-10-05 NOTE — Patient Instructions (Signed)

## 2024-10-05 NOTE — Assessment & Plan Note (Signed)
 Chronic pelvic, groin, vaginal, rectal, upper legs, lower abdomen numbness x several years.  She did see a urologist and has an appointment to see a gastroenterologist.  She has not discussed it with Dr. Skeet yet.  The symptoms are likely related to syringomyelia with central cord syndrome, conus medullaris syndrome Primarily pelvis anesthesia - chronic: discussed w./Dr Skeet at the next OV that is coming up soon.

## 2024-10-07 ENCOUNTER — Telehealth: Payer: Self-pay

## 2024-10-07 ENCOUNTER — Encounter: Payer: Self-pay | Admitting: Neurology

## 2024-10-07 ENCOUNTER — Ambulatory Visit: Attending: Internal Medicine

## 2024-10-07 DIAGNOSIS — R002 Palpitations: Secondary | ICD-10-CM

## 2024-10-07 NOTE — Telephone Encounter (Signed)
 Tried to reach out to patient to let her know her orthotics are in and she can schedule an appointment to pick them up. Unable to reach, I did LVM for her to call us  back and schedule appointment.

## 2024-10-07 NOTE — Progress Notes (Unsigned)
 Enrolled patient for a 7 day Zio XT monitor to be mailed to patients home.

## 2024-10-07 NOTE — Telephone Encounter (Signed)
 Patient called back and left a message on the nurse line - she is returning your call - she has an appointment with Dr. Gaynel on 11/10/23 for toenail and callus trim. [phone]

## 2024-10-10 ENCOUNTER — Other Ambulatory Visit: Payer: Self-pay | Admitting: Gastroenterology

## 2024-10-13 ENCOUNTER — Other Ambulatory Visit (HOSPITAL_COMMUNITY): Payer: Self-pay

## 2024-10-13 ENCOUNTER — Telehealth: Payer: Self-pay | Admitting: Pharmacy Technician

## 2024-10-13 NOTE — Telephone Encounter (Signed)
 Pharmacy Patient Advocate Encounter   Received notification from Physician's Office that prior authorization for BOTOX  200 is required/requested.   Insurance verification completed.   The patient is insured through [facility].   Per test claim: PA required; PA submitted to above mentioned insurance via Fax Key/confirmation #/EOC [id number] Status is pending

## 2024-10-13 NOTE — Telephone Encounter (Signed)
 Pharmacy Patient Advocate Encounter- Injection via Medical Benefit:  Buy/Bill J code: Botox - G9414 CPT code: 35384 PA was submitted to [facility] and has been approved through: 12.18.25 TO 12.17.26 Authorization# [id number] VISITS

## 2024-10-14 ENCOUNTER — Ambulatory Visit: Admitting: Neurology

## 2024-10-14 DIAGNOSIS — G43009 Migraine without aura, not intractable, without status migrainosus: Secondary | ICD-10-CM | POA: Diagnosis not present

## 2024-10-14 MED ORDER — ONABOTULINUMTOXINA 100 UNITS IJ SOLR
200.0000 [IU] | Freq: Once | INTRAMUSCULAR | Status: AC
  Administered 2024-10-14: 155 [IU] via INTRAMUSCULAR

## 2024-10-14 NOTE — Progress Notes (Signed)

## 2024-10-27 DIAGNOSIS — M19042 Primary osteoarthritis, left hand: Secondary | ICD-10-CM | POA: Insufficient documentation

## 2024-10-27 NOTE — Progress Notes (Signed)
 " Radiation Oncology         [phone] ________________________________  Name: Beth Huffman        MRN: [id number]  Date of Service: 10/31/2024 DOB: Aug 29, 1961  RR:Eonuwpxnc, Karlynn [name], MD  Plotnikov, Karlynn [name], MD     REFERRING PHYSICIAN: Plotnikov, Karlynn [name], MD   DIAGNOSIS: The primary encounter diagnosis was Primary osteoarthritis of both knees. A diagnosis of Primary osteoarthritis of both hands was also pertinent to this visit. M17.11, M17.12, M19.041, M19.042   HISTORY OF PRESENT ILLNESS: Beth Huffman is a 64 y.o. female seen in consultation for radiation therapy.  She has a hx of multijoint arthritis, most significantly impacting the bilateral hands and knees. She has tried OTC medicines such as tylenol  and advil  as well as prescription medicines such as muscle relaxers, opioids and steroids. Currently she uses tylenol , naproxen  and oxycodone  to manage her sx. She had a great response to steroids but she thinks they caused GI issues and so she stopped them. Without medicine her pain is an 8/10 and with medicines her pain can go down to a 2/10 in severity but never goes away completely. Mornings are particularly hard as she hasn't taken any medicine since the night before and it takes her a while to recover and feel good enough to get moving. Essentially, she wakes up with 8/10 pain making it hard to get going in the morning and complete normal activities like walking her dog. The cold also makes her sx worse and so the pain and difficulty functioning is especially pronounced.  Various x-rays have been obtained over the years showing osteoarthritis/degenerative changes. She underwent medial unicompartmental knee replacement in 2019. She still has pain in that knee, however. She has undergone steroid injections in the bilateral knees and the L hip and back. The steroid injections have been moderately helpful but do not last for very long.  Reports her hands and knees are tender to  palpation. They ache all the time but sometimes the pain is worse than other times. Does report knee and lower extremity swelling as well as swelling of the bilateral hands with certain joints being more effected than others, particularly the L middle finger.   PREVIOUS RADIATION THERAPY: No  AUTOIMMUNE DISEASE: No  MEDICAL DEVICES: No  PREGNANCY: No    PAST MEDICAL HISTORY:  Past Medical History:  Diagnosis Date   Abnormal weight gain    Acute sinusitis 12/05/2008   1/18 8/18   Allergy 2005   During decompression surgery   Arachnoid cyst 08/19/2011   Arthritis 2010   Hands, Legs, Feet   Asthma 2023   Dr. Meade, Pulmonologist diagnosed   Chest tightness or pressure 05/07/2018   Constipation 09/08/2012   Chronic - opioid related 11/17 Amitiza    Contact dermatitis 03/16/2013   5/14 relapsing - poison ivy    Dyspnea 05/07/2018   Elevated liver enzymes 03/04/2011   Chronic off and on    Female stress incontinence 02/06/2009   Qualifier: Diagnosis of  By: Garald MD, Karlynn [name]    FEVER UNSPECIFIED 09/29/2007   Qualifier: Diagnosis of  By: Garald MD, Karlynn [name]    GERD 09/29/2007   Chronic  Protonix  prn  Potential benefits of a long term PPI use as well as potential risks  and complications were explained to the patient and were aknowledged.     GERD (gastroesophageal reflux disease)    Headache 12/20/2007   Dr Gladis Migraines (1 per 2 wks) Topamax  400  mg/d; Imitrex  prn; Ibuprofen  prn, Zofran  prn    Hyperglycemia 08/19/2011   Mild     Hypertension    Increased blood pressure (not hypertension) 08/23/2014   Intractable migraine 06/14/2009   Chronic Ibuprofen , Imitrex , Topamax ,  Nortriptyline  - intolerant   Knee pain 03/04/2011   R knee 2018 L knee pain - L knee b anserina and L knee is tender w/ROM: L knee pain is worse - patellofemoral syndrome vs other   LBP (low back pain)    Local anesthetic drug adverse reaction 01/21/2022   LOW BACK PAIN 09/29/2007   Chronic,  lately (2016) disabling Due to syringomyelia On Oxycodone   Potential benefits of a long term opioids use as well as potential risks (i.e. addiction risk, apnea etc) and complications (i.e. Somnolence, constipation and others) were explained to the patient and were aknowledged.     LUQ PAIN 05/23/2010   Qualifier: Diagnosis of  By: Plotnikov MD, Karlynn [name]    Migraine, chronic, without aura, intractable 10/28/2017   Botox  approved diagnosis. 7/19 Worse. Start Emagility inj. Relpax    Migraines    Myocardial infarction (HCC) 2010   Tachycardia   Nausea & vomiting 08/19/2011   Nausea without vomiting 06/14/2009   Due to migraines     NECK PAIN 06/14/2009    Chronic, lately (2016) disabling Due to syringomyelia On Oxycodone   Potential benefits of a long term opioids use as well as potential risks (i.e. addiction risk, apnea etc) and complications (i.e. Somnolence, constipation and others) were explained to the patient and were aknowledged.     NONSPECIFIC ABNORMAL RESULTS LIVR FUNCTION STUDY 09/28/2009   Qualifier: Diagnosis of  By: Garald MD, Karlynn [name]    OBSESSIVE-COMPULSIVE DISORDER 05/23/2010   Chronic - compulsive shopping    Onychomycosis 01/30/2016   4/17    Orthostatic hypotension 06/30/2013   9/14 likely Rapaflo induced    OTITIS EXTERNA 05/10/2008   Qualifier: Diagnosis of  By: Garald MD, Karlynn [name]    Palpitations 06/21/2014   PARESTHESIA 07/13/2008   Qualifier: Diagnosis of  By: Garald MD, Karlynn [name]    Partial thickness burn of left wrist 09/11/2015   Postoperative state 07/24/2016   Pyelonephritis 2008   Rash 07/27/2017   Stress 2009   Syncope, near 06/30/2013   9/14 likely Rapaflo induced 12/14 relapsing off Rapaflo x 7 2/15 no relapsing since on 1/2 dose of Topamax     Syringobulbia (HCC)    SYRINGOMYELIA 08/20/2007   Chronic Dr Onita Dr Homer at [facility] Chronic pain    Syringomyelia (HCC)    Tachycardia    Tonsillitis, chronic 02/26/2017   2018 worse   URINARY  TRACT INFECTION (UTI) 03/23/2008   Qualifier: Diagnosis of  By: Garald MD, Karlynn [name]    UTI (lower urinary tract infection)    Well adult exam 12/21/2013   We discussed age appropriate health related issues, including available/recomended screening tests and vaccinations. We discussed a need for adhering to healthy diet and exercise. Labs/EKG were reviewed/ordered. All questions were answered.          PAST SURGICAL HISTORY: Past Surgical History:  Procedure Laterality Date   ABDOMINAL HYSTERECTOMY     ANTERIOR AND POSTERIOR REPAIR N/A 07/24/2016   Procedure: Possible ANTERIOR (CYSTOCELE) repair, perineoplasty;  Surgeon: Percilla Burly, MD;  Location: [facility];  Service: Gynecology;  Laterality: N/A;   BLADDER SUSPENSION N/A 07/24/2016   Procedure: TRANSVAGINAL TAPE (TVT) PROCEDURE;  Surgeon: Percilla Burly, MD;  Location: [facility];  Service: Gynecology;  Laterality:  N/A;   BRAIN SURGERY  2005   Decompression   COLONOSCOPY  10/22/2016   Elspeth [facility] at [facility], stool in colon, 1-2 yr recall   COLONOSCOPY  2005   Dr Debrah, F/V, no polyps, normal except diverticulosis   CYSTOSCOPY N/A 07/24/2016   Procedure: CYSTOSCOPY;  Surgeon: Marie-Lyne Lavoie, MD;  Location: [facility];  Service: Gynecology;  Laterality: N/A;   FOOT SURGERY Right 2023   FOOT SURGERY Right 2023   Scraped top of foot   intracranial decompression surgery  2006   JOINT REPLACEMENT  2010?   Left Knee   LEFT HEART CATH AND CORONARY ANGIOGRAPHY N/A 05/07/2018   Procedure: LEFT HEART CATH AND CORONARY ANGIOGRAPHY;  Surgeon: Claudene Victory ORN, MD;  Location: [facility];  Service: Cardiovascular;  Laterality: N/A;   PARTIAL KNEE ARTHROPLASTY Left 09/07/2018   Procedure: UNICOMPARTMENTAL LEFT KNEE;  Surgeon: Beverley Evalene BIRCH, MD;  Location: [facility];  Service: Orthopedics;  Laterality: Left;  Adductor Block   ROBOTIC ASSISTED LAPAROSCOPIC SACROCOLPOPEXY N/A 07/24/2016   Procedure: ROBOTIC ASSISTED LAPAROSCOPIC  SACROCOLPOPEXY WITH PERINEOPLASTY;  Surgeon: Percilla Burly, MD;  Location: [facility];  Service: Gynecology;  Laterality: N/A;   TUBAL LIGATION       FAMILY HISTORY:  Family History  Problem Relation Age of Onset   Colon polyps Mother    COPD Mother    Peripheral vascular disease Mother    Hypertension Mother    Heart disease Mother    Arthritis Mother    Vision loss Mother    Hypertension Father    Arthritis Father    Stroke Father    Vision loss Father    Breast cancer Maternal Aunt    Colon cancer Maternal Uncle 60   Heart attack Maternal Grandmother    Diabetes Maternal Grandmother    Stroke Maternal Grandfather    Stroke Paternal Grandfather    Asthma Granddaughter    Anxiety disorder Other    ADD / ADHD Daughter    Asthma Daughter    Esophageal cancer Neg Hx    Pancreatic cancer Neg Hx    Stomach cancer Neg Hx      SOCIAL HISTORY:  reports that she has never smoked. She has been exposed to tobacco smoke. She has never used smokeless tobacco. She reports current drug use. Drug: Oxycodone . She reports that she does not drink alcohol.   ALLERGIES: Atorvastatin , Atenolol , Atorvastatin  calcium , Cefuroxime axetil, Codeine sulfate, Fentanyl , Imdur  [isosorbide  nitrate], Iodinated contrast media, Nitrofurantoin, Pregabalin, Rapaflo [silodosin], Topamax  [topiramate ], Tramadol, and Tramadol hcl   MEDICATIONS:  Current Outpatient Medications  Medication Sig Dispense Refill   aspirin  EC 81 MG tablet Take 81 mg by mouth daily. Swallow whole.     azelastine (ASTELIN) 0.1 % nasal spray 1-2 sprays each nostril twice a day; Duration: 30 days     botulinum toxin Type A  (BOTOX ) 200 units injection Inject 155 units IM into multiple site in the face,neck and head once every 90 days 1 each 4   cholecalciferol (VITAMIN D ) 1000 UNITS tablet Take 1,000 Units by mouth daily.     cyclobenzaprine  (FLEXERIL ) 5 MG tablet TAKE 1 TABLET THREE TIMES DAILY AS NEEDED FOR MUSCLE SPASMS. (Patient  taking differently: Take 5 mg by mouth as needed. TAKE 1 TABLET THREE TIMES DAILY AS NEEDED FOR MUSCLE SPASMS.) 90 tablet 3   Erenumab -aooe (AIMOVIG ) 140 MG/ML SOAJ Inject 140 mg into the skin every 28 (twenty-eight) days. 1.12 mL 5   estradiol  (ESTRACE ) 0.5 MG  tablet Take 1 tablet (0.5 mg total) by mouth daily. 90 tablet 1   fluticasone  furoate-vilanterol (BREO ELLIPTA ) 100-25 MCG/ACT AEPB Inhale 1 puff into the lungs daily. (Patient taking differently: Inhale 1 puff into the lungs as needed.) 1 each 5   linaclotide  (LINZESS ) 145 MCG CAPS capsule Take 1 capsule (145 mcg total) by mouth daily before breakfast. May combine with Linzess  72 mcg once daily 30 minutes before a meal 30 capsule 2   linaclotide  (LINZESS ) 72 MCG capsule Take 1-2 capsules (72-145 mcg total) by mouth daily before breakfast. 60 capsule 3   Multiple Vitamin (MULTIVITAMIN PO) Take 1 tablet by mouth daily.     naproxen  (NAPROSYN ) 500 MG tablet TAKE ONE TABLET BY MOUTH TWICE DAILY WITH A MEAL 60 tablet 3   nitroGLYCERIN  (NITROSTAT ) 0.4 MG SL tablet Place 1 tablet (0.4 mg total) under the tongue every 5 (five) minutes as needed for chest pain. 25 tablet 3   NURTEC 75 MG TBDP Take by mouth as needed.     ondansetron  (ZOFRAN ) 8 MG tablet TAKE ONE TABLET BY MOUTH EVERY 8 HOURS AS NEEDED FOR NAUSEA OR FOR VOMITING 21 tablet 3   Oxycodone  HCl 10 MG TABS Take 1 tablet (10 mg total) by mouth every 6 (six) hours as needed. 120 tablet 0   Oxycodone  HCl 10 MG TABS Take 1 tablet (10 mg total) by mouth every 6 (six) hours as needed. 120 tablet 0   Oxycodone  HCl 10 MG TABS Take 1 tablet (10 mg total) by mouth every 6 (six) hours as needed. 120 tablet 0   oxymetazoline  (AFRIN NASAL SPRAY) 0.05 % nasal spray Place 1 spray into both nostrils 2 (two) times daily. 30 mL 0   pantoprazole  (PROTONIX ) 40 MG tablet Take 1 tablet (40 mg total) by mouth 2 (two) times daily before a meal. 60 tablet 3   polyethylene glycol (MIRALAX  / GLYCOLAX ) packet Take 8.5  g by mouth every other day.     predniSONE  (DELTASONE ) 5 MG tablet Take 1-2 tablets (5-10 mg total) by mouth daily with breakfast. 100 tablet 1   rizatriptan  (MAXALT ) 10 MG tablet TAKE 1 TABLET AT ONSET OF MIGRAINE- MAY REPEAT ONCE IN 2 HOURS. LIMIT 2/24 HOURS. AVOID DAILY USE. 8 tablet 5   senna (SENOKOT) 8.6 MG tablet Take 1 tablet by mouth as needed for constipation.     verapamil  (CALAN -SR) 120 MG CR tablet TAKE ONE TABLET BY MOUTH AT BEDTIME 90 tablet 1   No current facility-administered medications for this encounter.     REVIEW OF SYSTEMS: The patient is doing well overall and a review of symptoms is otherwise negative.   PHYSICAL EXAM:  Wt Readings from Last 3 Encounters:  10/31/24 159 lb 12.8 oz (72.5 kg)  10/05/24 152 lb (68.9 kg)  09/21/24 152 lb (68.9 kg)   Temp Readings from Last 3 Encounters:  10/31/24 (!) 96.8 F (36 C)  10/05/24 98.2 F (36.8 C)  08/12/24 98 F (36.7 C) (Oral)   BP Readings from Last 3 Encounters:  10/31/24 (!) 150/86  10/05/24 (!) 148/86  09/21/24 130/84   Pulse Readings from Last 3 Encounters:  10/31/24 84  10/05/24 70  09/21/24 77   Pain Assessment Pain Score: 0-No pain/10   Physical Exam Vitals and nursing note reviewed.  Constitutional:      General: She is not in acute distress. Cardiovascular:     Rate and Rhythm: Normal rate.  Pulmonary:     Effort: Pulmonary effort  is normal. No respiratory distress.  Abdominal:     General: There is no distension.  Musculoskeletal:        General: Swelling (bilateral knee and LE swelling; swelling of various joints in the hand in particular multiple PIP joints with the L middle finger PIP joint being the most noticeably swollen; various bony nodules in the distal fingers), tenderness and deformity present.  Skin:    General: Skin is warm and dry.  Neurological:     General: No focal deficit present.     Mental Status: She is alert.  Psychiatric:        Mood and Affect: Mood normal.       LABORATORY DATA:  Lab Results  Component Value Date   WBC 4.9 08/12/2024   HGB 12.6 08/12/2024   HCT 38.9 08/12/2024   MCV 94.4 08/12/2024   PLT 296 08/12/2024   Lab Results  Component Value Date   NA 135 08/12/2024   K 4.0 08/12/2024   CL 100 08/12/2024   CO2 23 08/12/2024   Lab Results  Component Value Date   ALT 31 05/12/2024   AST 32 05/12/2024   ALKPHOS 68 05/12/2024   BILITOT 0.4 05/12/2024      RADIOGRAPHY:   DG Hand Complete L 09/20/2019:  FINDINGS: There is no acute fracture or dislocation. Mild juxta-articular osteopenia. Mild arthritic changes of the PIP and DIP joints. There is soft tissue swelling over the MCP joints. No radiopaque foreign object or soft tissue gas.   IMPRESSION: 1. No acute fracture or dislocation. 2. Soft tissue swelling over the MCP joints.   PATHOLOGY: no pertinent pathology     IMPRESSION/PLAN:  Ms. Lawhead is a 64 yo F w/ moderate-severe osteoarthritis of various joints with significant symptoms in the bilateral hands and knees.  After a detailed discussion of the patients history, prior treatment response, and current goals, we reviewed the proposed protocol for LDRT targeting the bilateral hands and knees. Our institutional approach follows a regimen of 3 Gy total, delivered in 6 fractions of 0.5 Gy, 2-3x/week over 2-3 weeks. This dosing is consistent with published guidelines and peer-reviewed data for non-malignant musculoskeletal conditions.  We reviewed:   Goals of care: symptom relief and improved mobility Expected timeline for therapeutic benefit: typically several weeks to months Potential risks, including rare but theoretical concerns regarding late tissue effects or secondary malignancy (risk estimated to be extremely low at this dose and age) Lack of immunosuppression and inactive autoimmune history, minimizing concern for immune-mediated complications   My goal with low dose radiation therapy is to help  reduce chronic joint pain and improve mobility, particularly for activities like walking the dog and gardening. This treatment does not reverse joint damage, but it can calm the inflammation in and around the joint that contributes to pain. Based on published data, about 60-80% of patients with osteoarthritis experience meaningful pain relief within a few weeks to months after completing a short course of low-dose radiation. While not everyone responds, the majority of patients do report improvement, and the treatment is generally well tolerated.  The patient was agreeable to proceed. We will arrange CT simulation for planning and initiate treatment pending final review and setup. A follow-up visit will be scheduled approximately 8-12 weeks after completion of LDRT to assess clinical response.   We personally spent 60 minutes in this encounter including chart review, reviewing radiological studies, meeting face-to-face with the patient, entering orders and completing documentation.    Estefana HERO.  Maritza, M.D.   "

## 2024-10-28 ENCOUNTER — Other Ambulatory Visit: Payer: Self-pay | Admitting: Gastroenterology

## 2024-10-31 ENCOUNTER — Ambulatory Visit
Admission: RE | Admit: 2024-10-31 | Discharge: 2024-10-31 | Disposition: A | Discharge status: Home / Self Care | Source: Ambulatory Visit | Attending: Radiation Oncology | Admitting: Radiation Oncology

## 2024-10-31 ENCOUNTER — Encounter: Payer: Self-pay | Admitting: Radiation Oncology

## 2024-10-31 ENCOUNTER — Other Ambulatory Visit: Payer: Self-pay

## 2024-10-31 ENCOUNTER — Other Ambulatory Visit: Payer: Self-pay | Admitting: Neurology

## 2024-10-31 VITALS — BP 150/86 | HR 84 | Temp 96.8°F | Resp 18 | Ht 65.0 in | Wt 159.8 lb

## 2024-10-31 DIAGNOSIS — M170 Bilateral primary osteoarthritis of knee: Secondary | ICD-10-CM

## 2024-10-31 DIAGNOSIS — M19042 Primary osteoarthritis, left hand: Secondary | ICD-10-CM

## 2024-10-31 DIAGNOSIS — M1712 Unilateral primary osteoarthritis, left knee: Secondary | ICD-10-CM

## 2024-10-31 MED ORDER — LINACLOTIDE 145 MCG PO CAPS: 145.0000 ug | ORAL_CAPSULE | Freq: Every day | ORAL | 2 refills | Status: AC

## 2024-10-31 NOTE — Progress Notes (Addendum)
 Site of osteoarthritis:The primary encounter diagnosis was Primary osteoarthritis of left knee. A diagnosis of Primary osteoarthritis of both hands and right knee was also pertinent to this visit. M17.12, M19.041, M19.042   How long have you had pain? Hand- 3 years Knee- Almost 3 years  What over the counter or prescription medications have you tried? She has tried OTC medicines such as tylenol  and advil  as well as prescription medicines such as muscle relaxers and opioids. She has had a great response to steroids and is currently on low dose steroids.   Does anything make the pain better or worse? She alternates Tylenol  and Naproxen  along with Oxycodone .      Ambulatory status? Walker? Wheelchair?: Ambulatory  SAFETY ISSUES: Prior radiation? No Pacemaker/ICD? No Possible current pregnancy? No Is the patient on methotrexate? No  Current Complaints / other details:   BP (!) 150/86 (BP Location: Right Arm, Patient Position: Sitting, Cuff Size: Large)   Pulse 84   Temp (!) 96.8 F (36 C)   Resp 18   Ht 5' 5 (1.651 m)   Wt 159 lb 12.8 oz (72.5 kg)   SpO2 99%   BMI 26.59 kg/m

## 2024-10-31 NOTE — Telephone Encounter (Signed)
 Patient requesting 72 mcg and 145 mcgs Linzess  script.  Scheduled patient to see Ellouise Console this Friday, 1-9.

## 2024-11-01 ENCOUNTER — Other Ambulatory Visit: Payer: Self-pay | Admitting: Internal Medicine

## 2024-11-01 ENCOUNTER — Other Ambulatory Visit: Payer: Self-pay | Admitting: Student

## 2024-11-01 ENCOUNTER — Telehealth: Payer: Self-pay | Admitting: Radiation Oncology

## 2024-11-01 NOTE — Telephone Encounter (Signed)
 1/6 @ 8:39 Received call from patient, she stated received message to call back to set up for her treatments.  Called CT Sim no answer, called Support RTT and transfer call as requested.

## 2024-11-02 MED ORDER — VERAPAMIL HCL ER 120 MG PO TBCR: 120.0000 mg | EXTENDED_RELEASE_TABLET | Freq: Every day | ORAL | 1 refills | Status: AC

## 2024-11-03 NOTE — Progress Notes (Signed)
 "     Beth Console, PA-C [address] [place], [place]  [address] Phone: [phone]   Primary Care Physician: Garald Karlynn [name], MD  Primary Gastroenterologist:  Beth Console, PA-C / Elspeth Naval, MD   Chief Complaint: Follow-up chronic constipation, hemorrhoid bleeding, and GERD.   HPI:   Discussed the use of AI scribe software for clinical note transcription with the patient, who gave verbal consent to proceed.  Established patient Dr. Naval, returns for follow-up of constipation and mild rectal bleeding from hemorrhoids.  She has history of opioid-induced constipation (chronic oxycodone ).  Improved on high dose Linzess  .45mcg Plus 72mcg daily.  06/2024 colonoscopy by Dr. Naval showed a few small adenomatous polyps removed.  7-year repeat (06/2031).  06/2024 EGD showed 3 cm hiatal hernia.  She is on Protonix  40 mg twice daily for acid reflux.  A few small benign fundic gland gastric polyps.  No follow-up.  No esophageal stricture.  She underwent empiric esophageal dilation for dysphagia.  History of Present Illness Beth Huffman is a 64 year old female with syringomyelia, chronic constipation, and gastroesophageal reflux disease who presents for management of bowel and bladder dysfunction.  Neurological Dysfunction and Bowel/Bladder Symptoms: - Syringomyelia with spinal cord cysts, diagnosed over 20 years ago following brain surgery for an arachnoid cyst at the brainstem in 2005 - Last spinal cord imaging performed approximately three years ago prior to foot surgery; no recent pelvic or lumbar MRI - Over the past few years, developed decreased sensation in the rectal area, bowel and bladder leakage, and inability to feel bowel movements - Symptoms have been discussed with primary care and OB GYN.  It was recommended that she follow-up with neurosurgeon at [facility].  Constipation and Bowel Management: - Chronic constipation managed with Linzess ; 72 mcg  and 145 mcg doses are effective and well-tolerated, while 290 mcg causes diarrhea - Linzess  taken as directed, followed by a small meal; occasionally supplements with Dulcolax - Linzess  has significantly improved daily life, allowing regular morning bowel movements - Occasional use of Pepto-Bismol for stomach upset - Previous anorectal manometry performed to assess pelvic floor muscle function  Gastroesophageal Reflux and Swallowing Difficulties: - History of acid reflux managed with Protonix  twice daily - Reflux symptoms persist greatly improved on twice daily pantoprazole . - Swallowing has improved since last upper endoscopy, though minor difficulty remains with certain foods such as peanut butter and pretzels  Colonic and Gastric Polyps, Hiatal Hernia: - Colonoscopy in September revealed a few small adenomatous polyps, which were removed; repeat colonoscopy recommended in seven years - Upper endoscopy showed a small to medium hiatal hernia and a few small benign stomach polyps; no evidence of stricture - Esophagus was dilated during the procedure  Abdominal and Musculoskeletal Symptoms: - No new abdominal pain reported - Left low back pain disrupts sleep and sometimes requires a heating pad - No other new gastrointestinal symptoms     Current Outpatient Medications  Medication Sig Dispense Refill   AIMOVIG  140 MG/ML SOAJ INJECT 140 MG INTO THE SKIN EVERY 28 DAYS AS DIRECTED 1 mL 2   aspirin  EC 81 MG tablet Take 81 mg by mouth daily. Swallow whole.     botulinum toxin Type A  (BOTOX ) 200 units injection Inject 155 units IM into multiple site in the face,neck and head once every 90 days 1 each 4   cholecalciferol (VITAMIN D ) 1000 UNITS tablet Take 1,000 Units by mouth daily.     cyclobenzaprine  (FLEXERIL ) 5 MG  tablet TAKE 1 TABLET THREE TIMES DAILY AS NEEDED FOR MUSCLE SPASMS. (Patient taking differently: Take 5 mg by mouth as needed. TAKE 1 TABLET THREE TIMES DAILY AS NEEDED FOR MUSCLE  SPASMS.) 90 tablet 3   estradiol  (ESTRACE ) 0.5 MG tablet Take 1 tablet (0.5 mg total) by mouth daily. 90 tablet 1   fluticasone  furoate-vilanterol (BREO ELLIPTA ) 100-25 MCG/ACT AEPB Inhale 1 puff into the lungs daily. (Patient taking differently: Inhale 1 puff into the lungs as needed.) 1 each 5   linaclotide  (LINZESS ) 145 MCG CAPS capsule Take 1 capsule (145 mcg total) by mouth daily before breakfast. May combine with Linzess  72 mcg once daily 30 minutes before a meal 30 capsule 2   linaclotide  (LINZESS ) 72 MCG capsule Take 1-2 capsules (72-145 mcg total) by mouth daily before breakfast. 60 capsule 3   Multiple Vitamin (MULTIVITAMIN PO) Take 1 tablet by mouth daily.     naproxen  (NAPROSYN ) 500 MG tablet TAKE ONE TABLET BY MOUTH TWICE DAILY WITH A MEAL 60 tablet 3   nitroGLYCERIN  (NITROSTAT ) 0.4 MG SL tablet Place 1 tablet (0.4 mg total) under the tongue every 5 (five) minutes as needed for chest pain. 25 tablet 3   NURTEC 75 MG TBDP Take by mouth as needed.     ondansetron  (ZOFRAN ) 8 MG tablet TAKE ONE TABLET BY MOUTH EVERY 8 HOURS AS NEEDED FOR NAUSEA OR FOR VOMITING 21 tablet 3   Oxycodone  HCl 10 MG TABS Take 1 tablet (10 mg total) by mouth every 6 (six) hours as needed. 120 tablet 0   Oxycodone  HCl 10 MG TABS Take 1 tablet (10 mg total) by mouth every 6 (six) hours as needed. 120 tablet 0   Oxycodone  HCl 10 MG TABS Take 1 tablet (10 mg total) by mouth every 6 (six) hours as needed. 120 tablet 0   oxymetazoline  (AFRIN NASAL SPRAY) 0.05 % nasal spray Place 1 spray into both nostrils 2 (two) times daily. 30 mL 0   pantoprazole  (PROTONIX ) 40 MG tablet Take 1 tablet (40 mg total) by mouth 2 (two) times daily before a meal. 60 tablet 3   polyethylene glycol (MIRALAX  / GLYCOLAX ) packet Take 8.5 g by mouth every other day.     rizatriptan  (MAXALT ) 10 MG tablet TAKE 1 TABLET AT ONSET OF MIGRAINE- MAY REPEAT ONCE IN 2 HOURS. LIMIT 2/24 HOURS. AVOID DAILY USE. 8 tablet 5   senna (SENOKOT) 8.6 MG tablet  Take 1 tablet by mouth as needed for constipation.     verapamil  (CALAN -SR) 120 MG CR tablet Take 1 tablet (120 mg total) by mouth at bedtime. 90 tablet 1   No current facility-administered medications for this visit.    Allergies as of 11/04/2024 - Review Complete 11/04/2024  Allergen Reaction Noted   Atorvastatin  Other (See Comments) 10/07/2024   Atenolol  Other (See Comments) 06/21/2014   Atorvastatin  calcium  Other (See Comments) 10/07/2024   Cefuroxime axetil Nausea Only and Other (See Comments) 12/28/2007   Codeine sulfate Nausea And Vomiting and Nausea Only    Fentanyl  Nausea And Vomiting and Other (See Comments) 02/06/2009   Imdur  [isosorbide  nitrate] Other (See Comments) 12/24/2021   Iodinated contrast media Rash 05/03/2018   Nitrofurantoin Other (See Comments) 03/23/2008   Pregabalin Other (See Comments)    Rapaflo [silodosin] Other (See Comments) 06/30/2013   Topamax  [topiramate ] Other (See Comments) 07/31/2021   Tramadol Other (See Comments) 12/25/2021   Tramadol hcl Nausea And Vomiting     Past Medical History:  Diagnosis Date  Abnormal weight gain    Acute sinusitis 12/05/2008   1/18 8/18   Allergy 2005   During decompression surgery   Arachnoid cyst 08/19/2011   Arthritis 2010   Hands, Legs, Feet   Asthma 2023   Dr. Meade, Pulmonologist diagnosed   Chest tightness or pressure 05/07/2018   Constipation 09/08/2012   Chronic - opioid related 11/17 Amitiza    Contact dermatitis 03/16/2013   5/14 relapsing - poison ivy    Dyspnea 05/07/2018   Elevated liver enzymes 03/04/2011   Chronic off and on    Female stress incontinence 02/06/2009   Qualifier: Diagnosis of  By: Garald MD, Karlynn [name]    FEVER UNSPECIFIED 09/29/2007   Qualifier: Diagnosis of  By: Garald MD, Karlynn [name]    GERD 09/29/2007   Chronic  Protonix  prn  Potential benefits of a long term PPI use as well as potential risks  and complications were explained to the patient and were aknowledged.      GERD (gastroesophageal reflux disease)    Headache 12/20/2007   Dr Gladis Migraines (1 per 2 wks) Topamax  400 mg/d; Imitrex  prn; Ibuprofen  prn, Zofran  prn    Hyperglycemia 08/19/2011   Mild     Hypertension    Increased blood pressure (not hypertension) 08/23/2014   Intractable migraine 06/14/2009   Chronic Ibuprofen , Imitrex , Topamax ,  Nortriptyline  - intolerant   Knee pain 03/04/2011   R knee 2018 L knee pain - L knee b anserina and L knee is tender w/ROM: L knee pain is worse - patellofemoral syndrome vs other   LBP (low back pain)    Local anesthetic drug adverse reaction 01/21/2022   LOW BACK PAIN 09/29/2007   Chronic, lately (2016) disabling Due to syringomyelia On Oxycodone   Potential benefits of a long term opioids use as well as potential risks (i.e. addiction risk, apnea etc) and complications (i.e. Somnolence, constipation and others) were explained to the patient and were aknowledged.     LUQ PAIN 05/23/2010   Qualifier: Diagnosis of  By: Plotnikov MD, Karlynn [name]    Migraine, chronic, without aura, intractable 10/28/2017   Botox  approved diagnosis. 7/19 Worse. Start Emagility inj. Relpax    Migraines    Myocardial infarction (HCC) 2010   Tachycardia   Nausea & vomiting 08/19/2011   Nausea without vomiting 06/14/2009   Due to migraines     NECK PAIN 06/14/2009    Chronic, lately (2016) disabling Due to syringomyelia On Oxycodone   Potential benefits of a long term opioids use as well as potential risks (i.e. addiction risk, apnea etc) and complications (i.e. Somnolence, constipation and others) were explained to the patient and were aknowledged.     NONSPECIFIC ABNORMAL RESULTS LIVR FUNCTION STUDY 09/28/2009   Qualifier: Diagnosis of  By: Garald MD, Karlynn [name]    OBSESSIVE-COMPULSIVE DISORDER 05/23/2010   Chronic - compulsive shopping    Onychomycosis 01/30/2016   4/17    Orthostatic hypotension 06/30/2013   9/14 likely Rapaflo induced    OTITIS EXTERNA 05/10/2008    Qualifier: Diagnosis of  By: Garald MD, Karlynn [name]    Palpitations 06/21/2014   PARESTHESIA 07/13/2008   Qualifier: Diagnosis of  By: Garald MD, Karlynn [name]    Partial thickness burn of left wrist 09/11/2015   Postoperative state 07/24/2016   Pyelonephritis 2008   Rash 07/27/2017   Stress 2009   Syncope, near 06/30/2013   9/14 likely Rapaflo induced 12/14 relapsing off Rapaflo x 7 2/15 no relapsing since on 1/2 dose of Topamax   Syringobulbia [facility])    SYRINGOMYELIA 08/20/2007   Chronic Dr Onita Dr Homer at [facility] Chronic pain    Syringomyelia [facility])    Tachycardia    Tonsillitis, chronic 02/26/2017   2018 worse   URINARY TRACT INFECTION (UTI) 03/23/2008   Qualifier: Diagnosis of  By: Garald MD, Karlynn [name]    UTI (lower urinary tract infection)    Well adult exam 12/21/2013   We discussed age appropriate health related issues, including available/recomended screening tests and vaccinations. We discussed a need for adhering to healthy diet and exercise. Labs/EKG were reviewed/ordered. All questions were answered.       Past Surgical History:  Procedure Laterality Date   ABDOMINAL HYSTERECTOMY     ANTERIOR AND POSTERIOR REPAIR N/A 07/24/2016   Procedure: Possible ANTERIOR (CYSTOCELE) repair, perineoplasty;  Surgeon: Percilla Burly, MD;  Location: [facility];  Service: Gynecology;  Laterality: N/A;   BLADDER SUSPENSION N/A 07/24/2016   Procedure: TRANSVAGINAL TAPE (TVT) PROCEDURE;  Surgeon: Percilla Burly, MD;  Location: [facility];  Service: Gynecology;  Laterality: N/A;   BRAIN SURGERY  2005   Decompression   COLONOSCOPY  10/22/2016   Elspeth Naval at [facility], stool in colon, 1-2 yr recall   COLONOSCOPY  2005   Dr Debrah, F/V, no polyps, normal except diverticulosis   CYSTOSCOPY N/A 07/24/2016   Procedure: CYSTOSCOPY;  Surgeon: Percilla Burly, MD;  Location: [facility];  Service: Gynecology;  Laterality: N/A;   FOOT SURGERY Right 2023   FOOT SURGERY Right 2023   Scraped top  of foot   intracranial decompression surgery  2006   JOINT REPLACEMENT  2010?   Left Knee   LEFT HEART CATH AND CORONARY ANGIOGRAPHY N/A 05/07/2018   Procedure: LEFT HEART CATH AND CORONARY ANGIOGRAPHY;  Surgeon: Claudene Victory ORN, MD;  Location: [facility];  Service: Cardiovascular;  Laterality: N/A;   PARTIAL KNEE ARTHROPLASTY Left 09/07/2018   Procedure: UNICOMPARTMENTAL LEFT KNEE;  Surgeon: Beverley Evalene BIRCH, MD;  Location: [facility];  Service: Orthopedics;  Laterality: Left;  Adductor Block   ROBOTIC ASSISTED LAPAROSCOPIC SACROCOLPOPEXY N/A 07/24/2016   Procedure: ROBOTIC ASSISTED LAPAROSCOPIC SACROCOLPOPEXY WITH PERINEOPLASTY;  Surgeon: Percilla Burly, MD;  Location: [facility];  Service: Gynecology;  Laterality: N/A;   TUBAL LIGATION      Review of Systems:    All systems reviewed and negative except where noted in HPI.    Physical Exam:  BP 126/82   Pulse 76   Ht 5' 5 (1.651 m)   Wt 155 lb (70.3 kg)   SpO2 96%   BMI 25.79 kg/m  No LMP recorded. Patient has had a hysterectomy.  General: Well-nourished, well-developed in no acute distress.  Neuro: Alert and oriented x 3.  Grossly intact.  Psych: Alert and cooperative, normal mood and affect.   Imaging Studies: No results found.  Labs: CBC    Component Value Date/Time   WBC 4.9 08/12/2024 0820   RBC 4.12 08/12/2024 0820   HGB 12.6 08/12/2024 0820   HGB 12.7 04/28/2018 1358   HCT 38.9 08/12/2024 0820   HCT 40.2 04/28/2018 1358   PLT 296 08/12/2024 0820   PLT 257 04/28/2018 1358   MCV 94.4 08/12/2024 0820   MCV 96 04/28/2018 1358   MCH 30.6 08/12/2024 0820   MCHC 32.4 08/12/2024 0820   RDW 12.4 08/12/2024 0820   RDW 13.6 04/28/2018 1358   LYMPHSABS 1.9 05/12/2024 1054   LYMPHSABS 3.1 04/28/2018 1358   MONOABS 0.2 05/12/2024 1054   EOSABS  0.1 05/12/2024 1054   EOSABS 0.2 04/28/2018 1358   BASOSABS 0.1 05/12/2024 1054   BASOSABS 0.1 04/28/2018 1358    CMP     Component Value Date/Time   NA 135  08/12/2024 0820   NA 138 04/04/2022 0000   K 4.0 08/12/2024 0820   CL 100 08/12/2024 0820   CO2 23 08/12/2024 0820   GLUCOSE 94 08/12/2024 0820   BUN 13 08/12/2024 0820   BUN 12 04/04/2022 0000   CREATININE 0.87 08/12/2024 0820   CREATININE 0.89 05/07/2020 1506   CALCIUM  9.6 08/12/2024 0820   PROT 6.7 05/12/2024 1054   ALBUMIN 4.3 05/12/2024 1054   AST 32 05/12/2024 1054   ALT 31 05/12/2024 1054   ALKPHOS 68 05/12/2024 1054   BILITOT 0.4 05/12/2024 1054   GFRNONAA >60 08/12/2024 0820   GFRAA >60 08/31/2018 1031     Assessment and Plan:   Beth Huffman is a 64 y.o. y/o female returns for follow-up of:  1.  Opioid-induced constipation: Improved on Linzess  145 +72 mcg daily. 2.  Rectal bleeding from internal hemorrhoids: Currently resolved. 3.  Dysphagia improved s/p esophageal dilation.  EGD showed no stricture.  No evidence of EOE. 4.  GERD, controlled on high-dose PPI.  No esophagitis on EGD. 5.  Adenomatous colon polyps 6.  Left low back pain  PLAN: - Continue Linzess  290 mcg daily - Continue pantoprazole  40 mg twice daily - Follow-up with PCP for bone density screening - 7-year repeat colonoscopy will be due in 2032. - Recommend she follow-up with neurosurgeon at [facility] for back pain with possible concern of bladder and bowel dysfunction.  Assessment & Plan Chronic constipation Chronic, multifactorial constipation with significant improvement on current regimen.  - Continue Linzess  at 72 mcg and 145 mcg doses as tolerated and effective. - Continue Dulcolax as needed for bowel movements. - Educated on Pepto-Bismol use and potential for black stools. - Advised to contact for medication refills or if symptoms worsen. - Follow-up in one year for medication management, with earlier contact if needed.  Gastroesophageal reflux disease Well-controlled on pantoprazole  twice daily with only occasional mild symptoms; dysphagia improved post-dilation. - Continue  pantoprazole  (Protonix ) twice daily. - Provided reassurance regarding current management and symptom control. - Advised to report any worsening symptoms.  History of colonic adenomatous polyps Remote small adenomatous polyps removed on recent colonoscopy; no concerning findings. Surveillance interval appropriate. - Reviewed prior colonoscopy findings and surveillance recommendations. - Repeat colonoscopy recommended in seven years.  Hiatal hernia Small to medium hiatal hernia identified on recent endoscopy, managed conservatively. - No intervention required at this time; continue current management.  Benign gastric polyps Few small benign gastric polyps identified on upper endoscopy; not precancerous and not requiring intervention. - Continue routine surveillance as per standard of care.      Beth Console, PA-C  Follow up 1 year or sooner if worsening GI symptoms.   "

## 2024-11-04 ENCOUNTER — Encounter: Payer: Self-pay | Admitting: Physician Assistant

## 2024-11-04 ENCOUNTER — Ambulatory Visit: Admitting: Physician Assistant

## 2024-11-04 VITALS — BP 126/82 | HR 76 | Ht 65.0 in | Wt 155.0 lb

## 2024-11-04 DIAGNOSIS — Z860101 Personal history of adenomatous and serrated colon polyps: Secondary | ICD-10-CM

## 2024-11-04 DIAGNOSIS — K449 Diaphragmatic hernia without obstruction or gangrene: Secondary | ICD-10-CM

## 2024-11-04 DIAGNOSIS — K317 Polyp of stomach and duodenum: Secondary | ICD-10-CM | POA: Diagnosis not present

## 2024-11-04 DIAGNOSIS — K5909 Other constipation: Secondary | ICD-10-CM

## 2024-11-04 DIAGNOSIS — K5903 Drug induced constipation: Secondary | ICD-10-CM

## 2024-11-04 DIAGNOSIS — Z860100 Personal history of colon polyps, unspecified: Secondary | ICD-10-CM

## 2024-11-04 DIAGNOSIS — K219 Gastro-esophageal reflux disease without esophagitis: Secondary | ICD-10-CM | POA: Diagnosis not present

## 2024-11-04 NOTE — Progress Notes (Signed)
 Agree with assessment and plan as outlined.

## 2024-11-04 NOTE — Patient Instructions (Signed)
 Continue your Linzess  and follow up with us  in a year.  _______________________________________________________  If your blood pressure at your visit was 140/90 or greater, please contact your primary care physician to follow up on this.  _______________________________________________________  If you are age 65 or older, your body mass index should be between 23-30. Your Body mass index is 25.79 kg/m. If this is out of the aforementioned range listed, please consider follow up with your Primary Care Provider.  If you are age 19 or younger, your body mass index should be between 19-25. Your Body mass index is 25.79 kg/m. If this is out of the aformentioned range listed, please consider follow up with your Primary Care Provider.   ________________________________________________________  [facility] providers would like to encourage you to use MYCHART to communicate with providers for non-urgent requests or questions.  Due to long hold times on the telephone, sending your provider a message by [facility] may be a faster and more efficient way to get a response.  Please allow 48 business hours for a response.  Please remember that this is for non-urgent requests.  _______________________________________________________  [facility] is using a team-based approach to care.  Your team is made up of your doctor and two to three APPS. Our APPS (Nurse Practitioners and Physician Assistants) work with your physician to ensure care continuity for you. They are fully qualified to address your health concerns and develop a treatment plan. They communicate directly with your gastroenterologist to care for you. Seeing the Advanced Practice Practitioners on your physician's team can help you by facilitating care more promptly, often allowing for earlier appointments, access to diagnostic testing, procedures, and other specialty referrals.    I appreciate the opportunity to care for you. Ellouise Console, PA

## 2024-11-09 ENCOUNTER — Ambulatory Visit: Admitting: Podiatry

## 2024-11-09 ENCOUNTER — Encounter: Payer: Self-pay | Admitting: Podiatry

## 2024-11-09 ENCOUNTER — Ambulatory Visit
Admission: RE | Admit: 2024-11-09 | Discharge: 2024-11-09 | Disposition: A | Discharge status: Home / Self Care | Source: Ambulatory Visit | Attending: Radiation Oncology | Admitting: Radiation Oncology

## 2024-11-09 DIAGNOSIS — M19042 Primary osteoarthritis, left hand: Secondary | ICD-10-CM | POA: Diagnosis present

## 2024-11-09 DIAGNOSIS — M170 Bilateral primary osteoarthritis of knee: Secondary | ICD-10-CM | POA: Diagnosis present

## 2024-11-09 DIAGNOSIS — M79671 Pain in right foot: Secondary | ICD-10-CM | POA: Diagnosis not present

## 2024-11-09 DIAGNOSIS — M19041 Primary osteoarthritis, right hand: Secondary | ICD-10-CM | POA: Insufficient documentation

## 2024-11-09 DIAGNOSIS — L84 Corns and callosities: Secondary | ICD-10-CM | POA: Diagnosis not present

## 2024-11-09 DIAGNOSIS — M79672 Pain in left foot: Secondary | ICD-10-CM

## 2024-11-10 ENCOUNTER — Ambulatory Visit: Payer: Self-pay | Admitting: Internal Medicine

## 2024-11-10 ENCOUNTER — Ambulatory Visit (HOSPITAL_COMMUNITY)
Admission: RE | Admit: 2024-11-10 | Discharge: 2024-11-10 | Disposition: A | Discharge status: Home / Self Care | Source: Ambulatory Visit | Attending: Internal Medicine | Admitting: Internal Medicine

## 2024-11-10 DIAGNOSIS — I700 Atherosclerosis of aorta: Secondary | ICD-10-CM | POA: Diagnosis present

## 2024-11-10 DIAGNOSIS — R002 Palpitations: Secondary | ICD-10-CM | POA: Insufficient documentation

## 2024-11-10 DIAGNOSIS — E785 Hyperlipidemia, unspecified: Secondary | ICD-10-CM

## 2024-11-10 LAB — ECHOCARDIOGRAM COMPLETE
Area-P 1/2: 3.17 cm2
S' Lateral: 2.76 cm

## 2024-11-12 LAB — HEPATIC FUNCTION PANEL
ALT: 45 IU/L — ABNORMAL HIGH (ref 0–32)
AST: 45 IU/L — ABNORMAL HIGH (ref 0–40)
Albumin: 4.3 g/dL (ref 3.9–4.9)
Alkaline Phosphatase: 75 IU/L (ref 49–135)
Bilirubin Total: 0.4 mg/dL (ref 0.0–1.2)
Bilirubin, Direct: 0.15 mg/dL (ref 0.00–0.40)
Total Protein: 6.2 g/dL (ref 6.0–8.5)

## 2024-11-12 LAB — LIPID PANEL
Chol/HDL Ratio: 3.1 ratio (ref 0.0–4.4)
Cholesterol, Total: 226 mg/dL — ABNORMAL HIGH (ref 100–199)
HDL: 72 mg/dL
LDL Chol Calc (NIH): 138 mg/dL — ABNORMAL HIGH (ref 0–99)
Triglycerides: 91 mg/dL (ref 0–149)
VLDL Cholesterol Cal: 16 mg/dL (ref 5–40)

## 2024-11-12 LAB — LIPOPROTEIN A (LPA): Lipoprotein (a): <8.4 nmol/L

## 2024-11-14 ENCOUNTER — Telehealth: Payer: Self-pay

## 2024-11-14 NOTE — Telephone Encounter (Signed)
 Rec'd fax from [employer] that [facility] BS will only cover 1 capsule of Linzess  daily. Patient takes 145 mcgs and 72 mcgs.  MyChart message to patient letting her know we will request a PA for an exception. Faxed to RX PA team

## 2024-11-15 ENCOUNTER — Ambulatory Visit: Payer: Self-pay

## 2024-11-15 NOTE — Telephone Encounter (Signed)
 FYI Only or Action Required?: FYI only for provider: appointment scheduled on 1/20.  Patient was last seen in primary care on 10/05/2024 by Plotnikov, Karlynn [name], MD.  Called Nurse Triage reporting Hip Pain.  Symptoms began several days ago.  Interventions attempted: OTC medications: Tylenol .  Symptoms are: gradually worsening.  Triage Disposition: See PCP When Office is Open (Within 3 Days)  Patient/caregiver understands and will follow disposition?: Yes   Reason for Triage: Issue w/ bowels (constant diarrhea) and bladder pain (when laying down), nausea, headache, when lying down pain in hip/back on left side occurs (severe/worse when laying down).   Reason for Disposition  [1] MODERATE pain (e.g., interferes with normal activities, limping) AND [2] present > 3 days  Answer Assessment - Initial Assessment Questions 1. LOCATION and RADIATION: Where is the pain located? Does the pain spread (shoot) anywhere else?     Left hip 2. QUALITY: What does the pain feel like?  (e.g., sharp, dull, aching, burning)     sharp 3. SEVERITY: How bad is the pain? What does it keep you from doing?   (Scale 1-10; or mild, moderate, severe)     moderate 4. ONSET: When did the pain start? Does it come and go, or is it there all the time?     Past few days 5. WORK OR EXERCISE: Has there been any recent work or exercise that involved this part of the body?      no 6. CAUSE: What do you think is causing the hip pain?      unknown 7. AGGRAVATING FACTORS: What makes the hip pain worse? (e.g., walking, climbing stairs, running)     Laying down 8. OTHER SYMPTOMS: Do you have any other symptoms? (e.g., back pain, pain shooting down leg,  fever, rash)     no  Protocols used: Hip Pain-A-AH

## 2024-11-16 ENCOUNTER — Ambulatory Visit: Admitting: Internal Medicine

## 2024-11-16 ENCOUNTER — Encounter: Payer: Self-pay | Admitting: Internal Medicine

## 2024-11-16 ENCOUNTER — Ambulatory Visit: Payer: Self-pay | Admitting: Internal Medicine

## 2024-11-16 ENCOUNTER — Other Ambulatory Visit: Payer: Self-pay | Admitting: Internal Medicine

## 2024-11-16 ENCOUNTER — Ambulatory Visit

## 2024-11-16 VITALS — BP 130/70 | HR 77 | Temp 97.6°F | Ht 65.0 in | Wt 156.0 lb

## 2024-11-16 DIAGNOSIS — K592 Neurogenic bowel, not elsewhere classified: Secondary | ICD-10-CM | POA: Diagnosis not present

## 2024-11-16 DIAGNOSIS — R002 Palpitations: Secondary | ICD-10-CM

## 2024-11-16 DIAGNOSIS — G950 Syringomyelia and syringobulbia: Secondary | ICD-10-CM | POA: Diagnosis not present

## 2024-11-16 DIAGNOSIS — N319 Neuromuscular dysfunction of bladder, unspecified: Secondary | ICD-10-CM | POA: Diagnosis not present

## 2024-11-16 DIAGNOSIS — R200 Anesthesia of skin: Secondary | ICD-10-CM | POA: Diagnosis not present

## 2024-11-16 LAB — URINALYSIS, ROUTINE W REFLEX MICROSCOPIC
Bilirubin Urine: NEGATIVE
Hgb urine dipstick: NEGATIVE
Ketones, ur: NEGATIVE
Leukocytes,Ua: NEGATIVE
Nitrite: NEGATIVE
RBC / HPF: NONE SEEN
Specific Gravity, Urine: <=1.005 — AB (ref 1.000–1.030)
Total Protein, Urine: NEGATIVE
Urine Glucose: NEGATIVE
Urobilinogen, UA: 0.2 (ref 0.0–1.0)
pH: 6 (ref 5.0–8.0)

## 2024-11-16 NOTE — Progress Notes (Signed)
 "  Subjective:   Patient ID: Beth Huffman, female    DOB: 24-Mar-1961, 64 y.o.   MRN: [id number]  Discussed the use of AI scribe software for clinical note transcription with the patient, who gave verbal consent to proceed.  History of Present Illness Beth Huffman is a 64 year old female with syringomyelia who presents with worsening urinary and bowel dysfunction.  Over the past year, she has experienced worsening urinary and bowel dysfunction, with significant progression in the last six months. She reports a loss of sensation and difficulty emptying her bladder and bowels, leading to bloating and compaction. She has been using 'positive voice' to aid in elimination. Recently, she has experienced leaks and accidents, which have disrupted her daily life. No burning sensation during urination, but she reports difficulty sensing the need to void and incomplete emptying. Additionally, there is a lack of sensation during sexual activity.  Her symptoms have been more pronounced at night, with pain in her left hip and back, preventing her from lying on her left side. This pain has been present for the past week or two, intensifying a couple of days ago when she felt flu-like symptoms, including aches and chills.  She has a history of syringomyelia, with an arachnoid cyst surgery performed approximately 20 years ago. Her last imaging of the thoracic and cervical spine was in 2023, which showed a smaller cyst, but no recent imaging of the lumbar spine has been done. She has not had a dedicated specialist for her condition since her previous surgeon passed away.  She has been caring for her mother-in-law with dementia, which has led her to delay addressing her own health issues. Her symptoms became more noticeable during a trip to visit her daughter in [place]  in November.  Review of Systems  Constitutional: Negative.   HENT: Negative.    Eyes: Negative.   Respiratory:  Negative for  cough, chest tightness and shortness of breath.   Cardiovascular:  Negative for chest pain, palpitations and leg swelling.  Gastrointestinal:  Negative for abdominal distention, abdominal pain, constipation, diarrhea, nausea and vomiting.       Inability to empty bowels  Genitourinary:  Positive for difficulty urinating.  Musculoskeletal:  Positive for arthralgias and back pain.  Skin: Negative.   Neurological:  Positive for numbness.  Psychiatric/Behavioral: Negative.      Objective:  Physical Exam Constitutional:      Appearance: She is well-developed.  HENT:     Head: Normocephalic and atraumatic.  Cardiovascular:     Rate and Rhythm: Normal rate and regular rhythm.  Pulmonary:     Effort: Pulmonary effort is normal. No respiratory distress.     Breath sounds: Normal breath sounds. No wheezing or rales.  Abdominal:     General: Bowel sounds are normal. There is no distension.     Palpations: Abdomen is soft.     Tenderness: There is no abdominal tenderness.  Musculoskeletal:        General: Tenderness present.     Cervical back: Normal range of motion.  Skin:    General: Skin is warm and dry.  Neurological:     Mental Status: She is alert and oriented to person, place, and time.     Coordination: Coordination normal.     Vitals:   11/16/24 0923  BP: 130/70  Pulse: 77  Temp: 97.6 F (36.4 C)  TempSrc: Oral  SpO2: 97%  Weight: 156 lb (70.8 kg)  Height: 5' 5 (1.651  m)    Assessment and Plan Assessment & Plan Syringomyelia with neurogenic bladder and bowel   Chronic syringomyelia is causing progressive neurogenic bladder and bowel dysfunction, with symptoms of decreased sensation, difficulty emptying bladder and bowels, and recent incontinence. Urgent evaluation is necessary to prevent further deterioration. Ordered thoracic, lumbar, and hip x-rays. Plan for MRI of thoracic and lumbar spine pending x-ray results.   Neurogenic bowel This is a new symptom in the  last 2-3 months and progressive in nature. With prior spinal findings concerning for change/progression. Checking x-ray and likely will need MRI.   Neurogenic bladder This is a new symptom in the last 2-3 months and progressive in nature. With prior spinal findings concerning for change/progression. Checking x-ray and likely will need MRI.   Saddle anesthesia New symptom preset in the last 6 months and progressive causing progressive bowel and bladder symptoms. This is concerning. Ordered x-ray thoracic and lumbar and hips today. Will likely need MRI unless definitive answer on x-ray and will be urgent need.    "

## 2024-11-16 NOTE — Patient Instructions (Signed)
 We will check the x-ray today and plan to get MRI to check the spine.

## 2024-11-17 DIAGNOSIS — M19041 Primary osteoarthritis, right hand: Secondary | ICD-10-CM | POA: Diagnosis not present

## 2024-11-18 ENCOUNTER — Ambulatory Visit: Payer: Self-pay | Admitting: Internal Medicine

## 2024-11-18 ENCOUNTER — Other Ambulatory Visit: Payer: Self-pay

## 2024-11-18 DIAGNOSIS — R200 Anesthesia of skin: Secondary | ICD-10-CM

## 2024-11-18 DIAGNOSIS — I700 Atherosclerosis of aorta: Secondary | ICD-10-CM

## 2024-11-18 DIAGNOSIS — G950 Syringomyelia and syringobulbia: Secondary | ICD-10-CM

## 2024-11-18 DIAGNOSIS — E785 Hyperlipidemia, unspecified: Secondary | ICD-10-CM

## 2024-11-18 NOTE — Telephone Encounter (Signed)
 Pt is aware of results through MyChart message.

## 2024-11-19 NOTE — Progress Notes (Signed)
 "  Subjective:  Patient ID: Beth Huffman, female    DOB: September 06, 1961,  MRN: [id number]  Beth Huffman presents to clinic today for painful callus(es) of both feet. Aggravating factors include weightbearing with and without shoe gear. Pain is relieved with periodic professional debridement.  Chief Complaint  Patient presents with   RFC    RFC calluses  Not diabetic.     New problem(s): None.   PCP is Plotnikov, Karlynn [name], MD. ARNETTA 07/06/2024.  Allergies[1]  Review of Systems: Negative except as noted in the HPI.  Objective: No changes noted in today's physical examination. There were no vitals filed for this visit. Beth Huffman is a pleasant 64 y.o. female in NAD. AAO x 3.  Vascular Examination: Capillary refill time immediate b/l. Vascular status intact b/l with palpable pedal pulses. Pedal hair present b/l. No pain with calf compression b/l. Skin temperature gradient WNL b/l. No cyanosis or clubbing b/l. No ischemia or gangrene noted b/l.   Neurological Examination: Sensation grossly intact b/l with 10 gram monofilament. Vibratory sensation intact b/l.   Dermatological Examination: Pedal skin with normal turgor, texture and tone b/l.  No open wounds. No interdigital macerations.   Toenails bilateral great toes, 3-5 bilaterally, and R 2nd toe well maintained with adequate length. No erythema, no edema, no drainage, no fluctuance. Toenails L 2nd toe elongated, discolored, dystrophic, thickened, and crumbly with subungual debris and tenderness to dorsal palpation. Hyperkeratotic lesion(s) submet head 2 right foot and submet head 4 left foot.  No erythema, no edema, no drainage, no fluctuance.  Musculoskeletal Examination: Muscle strength 5/5 to all lower extremity muscle groups bilaterally. No pain, crepitus or joint limitation noted with ROM b/l LE. No gross bony pedal deformities b/l. Patient ambulates independently without assistive aids.  Radiographs:  None  Assessment/Plan: 1. Callus   2. Pain in both feet     -Patient was evaluated today. All questions/concerns addressed on today's visit. -Patient to continue soft, supportive shoe gear daily. -Callus(es) submet head 2 right foot and submet head 4 left foot pared utilizing sterile scalpel blade without complication or incident. Total number debrided =2. -Patient/POA to call should there be question/concern in the interim.   Return in about 3 months (around 02/07/2025).  Delon LITTIE Merlin, DPM      [facility] LOCATION: [address] [place] [address]                   Office [phone]   [facility] LOCATION: [address] [place], [place] [address] Office [phone]     [1]  Allergies Allergen Reactions   Atorvastatin  Other (See Comments)    Headaches   Atenolol  Other (See Comments)    Wt gain   Atorvastatin  Calcium  Other (See Comments)    Caused a constant headache    Cefuroxime Axetil Nausea Only and Other (See Comments)    REACTION: nausea - but tolerates PCN   Codeine Sulfate Nausea And Vomiting and Nausea Only   Fentanyl  Nausea And Vomiting and Other (See Comments)    REACTION: bad reaction   Imdur  [Isosorbide  Nitrate] Other (See Comments)    Headaches   Iodinated Contrast Media Rash   Nitrofurantoin  Other (See Comments)    Unknown   Pregabalin Other (See Comments)    REACTION: cp   Rapaflo [Silodosin] Other (See Comments)    Orthostatic BP drop, near syncope    Topamax  [Topiramate ] Other (See Comments)    Tight chest    Tramadol Other (See Comments)   Tramadol Hcl Nausea And Vomiting    REACTION: sick   "

## 2024-11-21 NOTE — Telephone Encounter (Signed)
 Based on patient dosing, would it be appropriate to change patient therapy to one capsule of the 290MCG capsule daily? This would be easier to get approved than the two separate doses.

## 2024-11-22 ENCOUNTER — Other Ambulatory Visit (HOSPITAL_COMMUNITY): Payer: Self-pay

## 2024-11-22 ENCOUNTER — Ambulatory Visit: Payer: Self-pay | Admitting: Internal Medicine

## 2024-11-23 ENCOUNTER — Other Ambulatory Visit: Payer: Self-pay

## 2024-11-23 ENCOUNTER — Ambulatory Visit
Admission: RE | Admit: 2024-11-23 | Discharge: 2024-11-23 | Disposition: A | Source: Ambulatory Visit | Attending: Radiation Oncology | Admitting: Radiation Oncology

## 2024-11-23 LAB — RAD ONC ARIA SESSION SUMMARY
Course Elapsed Days: 0
Plan Fractions Treated to Date: 1
Plan Fractions Treated to Date: 1
Plan Prescribed Dose Per Fraction: 0.5 Gy
Plan Prescribed Dose Per Fraction: 0.5 Gy
Plan Total Fractions Prescribed: 6
Plan Total Fractions Prescribed: 6
Plan Total Prescribed Dose: 3 Gy
Plan Total Prescribed Dose: 3 Gy
Reference Point Dosage Given to Date: 0.5 Gy
Reference Point Dosage Given to Date: 0.5 Gy
Reference Point Session Dosage Given: 0.5 Gy
Reference Point Session Dosage Given: 0.5 Gy
Session Number: 1

## 2024-11-23 NOTE — Addendum Note (Signed)
 Addended by: GORDON RONAL SQUIBB on: 11/23/2024 09:18 AM   Modules accepted: Orders

## 2024-11-25 ENCOUNTER — Ambulatory Visit

## 2024-11-28 ENCOUNTER — Other Ambulatory Visit (HOSPITAL_COMMUNITY): Payer: Self-pay

## 2024-11-28 ENCOUNTER — Ambulatory Visit: Admitting: Pharmacist Clinician (PhC)/ Clinical Pharmacy Specialist

## 2024-11-28 NOTE — Progress Notes (Unsigned)
 "  Office Visit    Patient Name: Beth Huffman Date of Encounter: 11/28/2024  Primary Care Provider:  Garald Karlynn [name], MD Primary Cardiologist:  None  Chief Complaint    Hyperlipidemia   Significant Past Medical History   Aortic atherosclerosis   palpitations TSHS normal, on verapamil               Allergies[1]  History of Present Illness    Beth Huffman is a 64 y.o. female patient of Dr Wendel, in the office today to discuss options for cholesterol management.  Insurance Carrier: [employer]  [phone]  [employer] PA - $214/first month, then $139/month  Pharmacy:     [facility]:      LDL Cholesterol goal:    Current Medications:     Previously tried:    Family Hx:     Social Hx: Tobacco: Alcohol:      Diet:      Exercise:   Adherence Assessment  Do you ever forget to take your medication? [] Yes [] No  Do you ever skip doses due to side effects? [] Yes [] No  Do you have trouble affording your medicines? [] Yes [] No  Are you ever unable to pick up your medication due to transportation difficulties? [] Yes [] No  Do you ever stop taking your medications because you don't believe they are helping? [] Yes [] No  Do you check your weight daily? [] Yes [] No   Adherence strategy: ***  Barriers to obtaining medications: ***     Accessory Clinical Findings   Lab Results  Component Value Date   CHOL 226 (H) 11/10/2024   HDL 72 11/10/2024   LDLCALC 138 (H) 11/10/2024   LDLDIRECT 130.2 12/21/2013   TRIG 91 11/10/2024   CHOLHDL 3.1 11/10/2024    Lipoprotein (a)  Date/Time Value Ref Range Status  11/10/2024 10:26 AM <8.4 <75.0 nmol/L Final    Comment:    **Results verified by repeat testing** Note:  Values greater than or equal to 75.0 nmol/L may        indicate an independent risk factor for CHD,        but must be evaluated with caution when applied        to non-Caucasian populations due to the        influence of genetic factors on  Lp(a) across        ethnicities.     Lab Results  Component Value Date   ALT 45 (H) 11/10/2024   AST 45 (H) 11/10/2024   ALKPHOS 75 11/10/2024   BILITOT 0.4 11/10/2024   Lab Results  Component Value Date   CREATININE 0.87 08/12/2024   BUN 13 08/12/2024   NA 135 08/12/2024   K 4.0 08/12/2024   CL 100 08/12/2024   CO2 23 08/12/2024   Lab Results  Component Value Date   HGBA1C 5.5 03/31/2018    Home Medications    Current Outpatient Medications  Medication Sig Dispense Refill   AIMOVIG  140 MG/ML SOAJ INJECT 140 MG INTO THE SKIN EVERY 28 DAYS AS DIRECTED 1 mL 2   aspirin  EC 81 MG tablet Take 81 mg by mouth daily. Swallow whole.     botulinum toxin Type A  (BOTOX ) 200 units injection Inject 155 units IM into multiple site in the face,neck and head once every 90 days 1 each 4   cholecalciferol (VITAMIN D ) 1000 UNITS tablet Take 1,000 Units by mouth daily.     cyclobenzaprine  (FLEXERIL ) 5 MG tablet TAKE 1 TABLET THREE  TIMES DAILY AS NEEDED FOR MUSCLE SPASMS. (Patient taking differently: Take 5 mg by mouth as needed. TAKE 1 TABLET THREE TIMES DAILY AS NEEDED FOR MUSCLE SPASMS.) 90 tablet 3   estradiol  (ESTRACE ) 0.5 MG tablet Take 1 tablet (0.5 mg total) by mouth daily. 90 tablet 1   fluticasone  furoate-vilanterol (BREO ELLIPTA ) 100-25 MCG/ACT AEPB Inhale 1 puff into the lungs daily. (Patient taking differently: Inhale 1 puff into the lungs as needed.) 1 each 5   linaclotide  (LINZESS ) 145 MCG CAPS capsule Take 1 capsule (145 mcg total) by mouth daily before breakfast. May combine with Linzess  72 mcg once daily 30 minutes before a meal 30 capsule 2   linaclotide  (LINZESS ) 72 MCG capsule Take 1-2 capsules (72-145 mcg total) by mouth daily before breakfast. 60 capsule 3   Multiple Vitamin (MULTIVITAMIN PO) Take 1 tablet by mouth daily.     naproxen  (NAPROSYN ) 500 MG tablet TAKE ONE TABLET BY MOUTH TWICE DAILY WITH A MEAL 60 tablet 3   nitroGLYCERIN  (NITROSTAT ) 0.4 MG SL tablet Place 1  tablet (0.4 mg total) under the tongue every 5 (five) minutes as needed for chest pain. 25 tablet 3   NURTEC 75 MG TBDP Take by mouth as needed.     ondansetron  (ZOFRAN ) 8 MG tablet TAKE ONE TABLET BY MOUTH EVERY 8 HOURS AS NEEDED FOR NAUSEA OR FOR VOMITING 21 tablet 3   Oxycodone  HCl 10 MG TABS Take 1 tablet (10 mg total) by mouth every 6 (six) hours as needed. 120 tablet 0   Oxycodone  HCl 10 MG TABS Take 1 tablet (10 mg total) by mouth every 6 (six) hours as needed. 120 tablet 0   Oxycodone  HCl 10 MG TABS Take 1 tablet (10 mg total) by mouth every 6 (six) hours as needed. 120 tablet 0   oxymetazoline  (AFRIN NASAL SPRAY) 0.05 % nasal spray Place 1 spray into both nostrils 2 (two) times daily. 30 mL 0   pantoprazole  (PROTONIX ) 40 MG tablet Take 1 tablet (40 mg total) by mouth 2 (two) times daily before a meal. 60 tablet 3   polyethylene glycol (MIRALAX  / GLYCOLAX ) packet Take 8.5 g by mouth every other day.     rizatriptan  (MAXALT ) 10 MG tablet TAKE 1 TABLET AT ONSET OF MIGRAINE- MAY REPEAT ONCE IN 2 HOURS. LIMIT 2/24 HOURS. AVOID DAILY USE. 8 tablet 5   senna (SENOKOT) 8.6 MG tablet Take 1 tablet by mouth as needed for constipation.     verapamil  (CALAN -SR) 120 MG CR tablet Take 1 tablet (120 mg total) by mouth at bedtime. 90 tablet 1   No current facility-administered medications for this visit.     Assessment & Plan    No problem-specific Assessment & Plan notes found for this encounter.   Allean Mink, PharmD CPP [facility] [address]   [place], [place] [address] [phone]  11/28/2024, 9:46 AM      [1]  Allergies Allergen Reactions   Atorvastatin  Other (See Comments)    Headaches   Atenolol  Other (See Comments)    Wt gain   Atorvastatin  Calcium  Other (See Comments)    Caused a constant headache    Cefuroxime Axetil Nausea Only and Other (See Comments)    REACTION: nausea - but tolerates PCN   Codeine Sulfate Nausea And Vomiting and Nausea Only   Fentanyl  Nausea And Vomiting  and Other (See Comments)    REACTION: bad reaction   Imdur  [Isosorbide  Nitrate] Other (See Comments)    Headaches   Iodinated  Contrast Media Rash   Nitrofurantoin Other (See Comments)    Unknown   Pregabalin Other (See Comments)    REACTION: cp   Rapaflo [Silodosin] Other (See Comments)    Orthostatic BP drop, near syncope    Topamax  [Topiramate ] Other (See Comments)    Tight chest    Tramadol Other (See Comments)   Tramadol Hcl Nausea And Vomiting    REACTION: sick   "

## 2024-11-30 ENCOUNTER — Ambulatory Visit
Admission: RE | Admit: 2024-11-30 | Discharge: 2024-11-30 | Disposition: A | Source: Ambulatory Visit | Attending: Radiation Oncology

## 2024-11-30 ENCOUNTER — Other Ambulatory Visit: Payer: Self-pay

## 2024-11-30 LAB — RAD ONC ARIA SESSION SUMMARY
Course Elapsed Days: 7
Plan Fractions Treated to Date: 2
Plan Fractions Treated to Date: 2
Plan Prescribed Dose Per Fraction: 0.5 Gy
Plan Prescribed Dose Per Fraction: 0.5 Gy
Plan Total Fractions Prescribed: 6
Plan Total Fractions Prescribed: 6
Plan Total Prescribed Dose: 3 Gy
Plan Total Prescribed Dose: 3 Gy
Reference Point Dosage Given to Date: 1 Gy
Reference Point Dosage Given to Date: 1 Gy
Reference Point Session Dosage Given: 0.5 Gy
Reference Point Session Dosage Given: 0.5 Gy
Session Number: 2

## 2024-12-02 ENCOUNTER — Ambulatory Visit

## 2024-12-06 ENCOUNTER — Ambulatory Visit: Admitting: Internal Medicine

## 2024-12-06 ENCOUNTER — Other Ambulatory Visit

## 2024-12-07 ENCOUNTER — Ambulatory Visit

## 2024-12-09 ENCOUNTER — Ambulatory Visit

## 2024-12-12 ENCOUNTER — Ambulatory Visit

## 2024-12-14 ENCOUNTER — Ambulatory Visit

## 2024-12-19 ENCOUNTER — Ambulatory Visit (HOSPITAL_BASED_OUTPATIENT_CLINIC_OR_DEPARTMENT_OTHER): Payer: Self-pay

## 2025-01-03 ENCOUNTER — Ambulatory Visit: Admitting: Internal Medicine

## 2025-01-09 ENCOUNTER — Ambulatory Visit: Admitting: Pharmacist Clinician (PhC)/ Clinical Pharmacy Specialist

## 2025-01-10 ENCOUNTER — Ambulatory Visit: Admitting: Internal Medicine

## 2025-01-20 ENCOUNTER — Ambulatory Visit: Payer: Self-pay | Admitting: Neurology

## 2025-02-15 ENCOUNTER — Ambulatory Visit: Admitting: Podiatry

## 2025-03-09 ENCOUNTER — Ambulatory Visit: Admitting: Neurology

## 2025-03-29 ENCOUNTER — Ambulatory Visit
